# Patient Record
Sex: Female | Born: 1935
Health system: Southern US, Community
[De-identification: ages and names within clinical notes are randomized; demographics above are authoritative.]

## PROBLEM LIST (undated history)

## (undated) DIAGNOSIS — I509 Heart failure, unspecified: Secondary | ICD-10-CM

## (undated) DIAGNOSIS — N189 Chronic kidney disease, unspecified: Secondary | ICD-10-CM

## (undated) DIAGNOSIS — J449 Chronic obstructive pulmonary disease, unspecified: Secondary | ICD-10-CM

## (undated) DIAGNOSIS — D649 Anemia, unspecified: Secondary | ICD-10-CM

## (undated) DIAGNOSIS — E119 Type 2 diabetes mellitus without complications: Secondary | ICD-10-CM

## (undated) DIAGNOSIS — I10 Essential (primary) hypertension: Secondary | ICD-10-CM

## (undated) HISTORY — PX: OTHER SURGICAL HISTORY: SHX169

## (undated) HISTORY — DX: Essential (primary) hypertension: I10

## (undated) HISTORY — PX: CATARACT EXTRACTION, BILATERAL: SHX1313

## (undated) HISTORY — DX: Chronic kidney disease, unspecified: N18.9

## (undated) HISTORY — PX: VALVE REPLACEMENT: SUR13

## (undated) HISTORY — DX: Type 2 diabetes mellitus without complications: E11.9

## (undated) HISTORY — PX: PACEMAKER PLACEMENT: SHX43

---

## 2005-08-15 ENCOUNTER — Ambulatory Visit: Payer: Self-pay | Admitting: Internal Medicine

## 2005-09-04 ENCOUNTER — Ambulatory Visit: Payer: Self-pay | Admitting: Internal Medicine

## 2005-10-05 ENCOUNTER — Ambulatory Visit: Payer: Self-pay | Admitting: Internal Medicine

## 2006-01-08 ENCOUNTER — Ambulatory Visit: Payer: Self-pay | Admitting: Internal Medicine

## 2007-06-29 ENCOUNTER — Ambulatory Visit: Payer: Self-pay | Admitting: Internal Medicine

## 2007-11-02 ENCOUNTER — Ambulatory Visit: Payer: Self-pay | Admitting: Internal Medicine

## 2008-01-28 ENCOUNTER — Inpatient Hospital Stay (HOSPITAL_COMMUNITY): Admission: EM | Admit: 2008-01-28 | Discharge: 2008-01-31 | Payer: Self-pay | Admitting: Emergency Medicine

## 2008-08-23 ENCOUNTER — Ambulatory Visit: Payer: Self-pay | Admitting: Internal Medicine

## 2008-10-11 ENCOUNTER — Ambulatory Visit: Payer: Self-pay | Admitting: Cardiology

## 2008-10-11 ENCOUNTER — Inpatient Hospital Stay: Payer: Self-pay | Admitting: Internal Medicine

## 2008-11-17 ENCOUNTER — Inpatient Hospital Stay: Payer: Self-pay | Admitting: Internal Medicine

## 2008-11-24 ENCOUNTER — Inpatient Hospital Stay: Payer: Self-pay | Admitting: Internal Medicine

## 2009-05-21 ENCOUNTER — Ambulatory Visit: Payer: Self-pay | Admitting: Internal Medicine

## 2010-06-25 ENCOUNTER — Inpatient Hospital Stay: Payer: Self-pay | Admitting: Internal Medicine

## 2010-07-04 ENCOUNTER — Emergency Department: Payer: Self-pay | Admitting: Emergency Medicine

## 2010-07-31 ENCOUNTER — Ambulatory Visit: Payer: Self-pay | Admitting: Internal Medicine

## 2010-11-19 NOTE — H&P (Signed)
NAME:  Kimberly Peterson, Kimberly Peterson NO.:  [id number]   MEDICAL RECORD NO.:  [id number]          PATIENT TYPE:  INP   LOCATION:  1432                         FACILITY:  [facility]   PHYSICIAN:  Della Goo, M.D. DATE OF BIRTH:  12-17-1929   DATE OF ADMISSION:  01/28/2008  DATE OF DISCHARGE:                              HISTORY & PHYSICAL   PRIMARY CARE PHYSICIAN:  Unassigned.   CHIEF COMPLAINT:  Fever, chills.   HISTORY OF PRESENT ILLNESS:  This is a 77 year old female who was  brought to the emergency department emergently after having sudden onset  of shakes, fevers and chills.  She reports feeling cold when the  sensation began.  The patient's family was concerned that she may have  been having a seizure.  The patient denies having any cough, congestion,  dysuria, chest pain, shortness of breath or nausea, vomiting, diarrhea.  The patient was brought to the emergency department and she was found to  have a temperature of 101.3 and a white blood cell count of 17.2 with  89% neutrophils.   PAST MEDICAL HISTORY:  Significant for type 2 diabetes mellitus and  hypertension.   PAST SURGICAL HISTORY:  None.   MEDICATIONS:  Will be further verified.   ALLERGIES:  NO KNOWN DRUG ALLERGIES.   SOCIAL HISTORY:  The patient is married.  She is a nonsmoker,  nondrinker.   FAMILY HISTORY:  Noncontributory.   REVIEW OF SYSTEMS:  Pertinents as mentioned above.   PHYSICAL EXAMINATION FINDINGS:  GENERAL:  This is an obese 76 year old  female in no discomfort or acute distress.  VITAL SIGNS:  Temperature 101.3, blood pressure 176/74, heart rate 99,  respirations 20, O2 saturation 93% on room air.  HEENT:  Normocephalic atraumatic.  Pupils equally round reactive to  light.  Extraocular movements are intact.  Funduscopic benign,  oropharynx is clear.  NECK:  Supple full range of motion.  No thyromegaly, adenopathy or  jugular venous distention.  CARDIOVASCULAR:  Regular rate and  rhythm.  No murmurs, gallops or rubs.  LUNGS: Clear to auscultation bilaterally.  ABDOMEN: Positive bowel sounds, soft, nontender, nondistended.  EXTREMITIES: Without cyanosis, clubbing or edema.  NEUROLOGIC:  Nonfocal.   LABORATORY STUDIES:  White blood cell count 17.2, hemoglobin 11.1,  hematocrit 33.4, platelets 312, neutrophils 89%, lymphocytes 7%.  Sodium  134, potassium 4.2, chloride 100, bicarb 29, BUN 33, creatinine 1.4 and  glucose 167.  Chest x-ray reveals pulmonary vascular congestion with  minimal bronchitic changes.   ASSESSMENT:  A 77 year old female being admitted with:  1. Sepsis.  2. Hypertension.  3. Mild dehydration.  4. Mild anemia.  5. Pyuria.   PLAN:  The patient will be admitted to telemetry area for cardiac  monitoring.  Blood cultures x2 have been sent along with a urine  culture, and the patient will be placed on IV fluids and IV antibiotic  therapy of vancomycin and Zosyn at this time.  Her home medications will  be further verified.  Sliding-scale insulin coverage has been ordered  for elevated blood sugars, and the patient will be placed on DVT  and GI  prophylaxis.      Della Goo, M.D.  Electronically Signed     HJ/MEDQ  D:  01/29/2008  T:  01/29/2008  Job:  [id number]

## 2010-11-19 NOTE — Discharge Summary (Signed)
NAME:  Kimberly Peterson, Kimberly Peterson NO.:  [id number]   MEDICAL RECORD NO.:  [id number]          PATIENT TYPE:  INP   LOCATION:  1432                         FACILITY:  [facility]   PHYSICIAN:  Lonia Blood, M.D.DATE OF BIRTH:  [birth year]   DATE OF ADMISSION:  01/28/2008  DATE OF DISCHARGE:  01/31/2008                               DISCHARGE SUMMARY   PRIMARY CARE PHYSICIAN:  Unassigned/physician in [place].   DISCHARGE DIAGNOSES:  1. Fever of unknown origin.      a.     Chest x-ray and follow-up chest x-ray and clinical exam       negative for pneumonia.      b.     Negative urinalysis.      c.     Negative blood cultures.      d.     Resolved with empiric antibiotic therapy.  2. Diabetes mellitus type 2 - uncontrolled.  3. Hypertension - uncontrolled.  4. Dehydration - corrected with intravenous fluid resuscitation.  5. NO KNOWN DRUG ALLERGIES.   DISCHARGE MEDICATIONS:  1. Clonidine 0.3 mg p.o. t.i.d.  2. Aspirin daily.  3. Amlodipine 10 mg p.o. daily.  4. Reglan 5 mg t.i.d.  5. 70/30 insulin 40 units q.a.m. and 20 units q.p.m.  6. Metformin/glyburide combination 500/2.5  two tablets b.i.d.  7. Lisinopril 20 mg daily.  8. Avelox 400 mg daily.  9. Norvasc 10 mg daily.   FOLLOW UP:  The patient is advised to follow-up with her primary care  physician in [place] on July 30 or February 04, 2008 for routine medical  recheck.  Further titration of antihypertensives is recommended at that  time.  Please note diuretics have been discontinued as the patient was  significantly dehydrated at the time of her presentation.   CONSULTATIONS:  None.   PROCEDURES:  None.   HOSPITAL COURSE:  Kimberly Peterson is a very pleasant 77 year old  female who presented to the hospital on the day of admission with sudden  onset of severe rigors and fevers.  She was found have a temperature of  101.3, a white blood cell count of 17.2 thousand.  Urinalysis was mildly  positive with a  trace leukocyte esterase.  Otherwise white blood cell  count was insignificant.  Urine was sent for culture.  Blood was sent  for culture x2.  Chest x-ray and clinical exam were not significant for  evidence of pneumonia.  The patient was placed on empiric IV Zosyn.  The  patient was placed in acute units for monitoring.  She was also found be  markedly dehydrated.  The patient's diuretics were held.  She was volume  resuscitated using crystalloid IV solution.  The patient tolerated this  without difficulty.  The blood pressure was difficult to control and  antihypertensives had to be titrated.  The patient's diabetes was  difficult to control and her diabetic regimen had to be titrated as  well.  With Zosyn therapy the patient rapidly defervesced.  White blood  cell count rapidly normalized.  At the time of the patient's discharge  her white  count is stable at 10.5.  Her T-max for the last 48 hours was  98.7.  She is up and around and tolerating her regular diet without  difficulty.  At the present time, the exact cause of the patient's fever  is unclear.  Nonetheless, clinically she has completely recovered and  there is no indication for ongoing inpatient hospital stay.  The patient  has been advised that should her fevers recur that she should seek  immediate attention.  Otherwise she is advised to follow up with her  primary care physician in the time frame listed above.      Lonia Blood, M.D.  Electronically Signed     JTM/MEDQ  D:  01/31/2008  T:  01/31/2008  Job:  [id number]

## 2011-04-04 LAB — DIFFERENTIAL
Lymphocytes Relative: 7 — ABNORMAL LOW
Lymphs Abs: 1.1
Monocytes Absolute: 0.8
Monocytes Relative: 4
Neutro Abs: 15.3 — ABNORMAL HIGH

## 2011-04-04 LAB — CBC
HCT: 33.3 — ABNORMAL LOW
HCT: 33.4 — ABNORMAL LOW
Hemoglobin: 11.1 — ABNORMAL LOW
Hemoglobin: 11.1 — ABNORMAL LOW
MCHC: 33.3
MCV: 87.4
Platelets: 321
Platelets: 324
RBC: 3.82 — ABNORMAL LOW
RBC: 3.85 — ABNORMAL LOW
RDW: 13.7

## 2011-04-04 LAB — POCT I-STAT, CHEM 8
BUN: 33 — ABNORMAL HIGH
Calcium, Ion: 1.11 — ABNORMAL LOW
Chloride: 100
Creatinine, Ser: 1.4 — ABNORMAL HIGH
Glucose, Bld: 167 — ABNORMAL HIGH

## 2011-04-04 LAB — URINE MICROSCOPIC-ADD ON

## 2011-04-04 LAB — GLUCOSE, CAPILLARY
Glucose-Capillary: 209 — ABNORMAL HIGH
Glucose-Capillary: 255 — ABNORMAL HIGH
Glucose-Capillary: 261 — ABNORMAL HIGH
Glucose-Capillary: 266 — ABNORMAL HIGH
Glucose-Capillary: 289 — ABNORMAL HIGH

## 2011-04-04 LAB — COMPREHENSIVE METABOLIC PANEL
ALT: 16
Albumin: 2.9 — ABNORMAL LOW
Alkaline Phosphatase: 64
Glucose, Bld: 186 — ABNORMAL HIGH
Potassium: 4
Sodium: 138
Total Protein: 6.8

## 2011-04-04 LAB — HEMOGLOBIN A1C: Hgb A1c MFr Bld: 6.6 — ABNORMAL HIGH

## 2011-04-04 LAB — CULTURE, BLOOD (ROUTINE X 2)
Culture: NO GROWTH
Culture: NO GROWTH

## 2011-04-04 LAB — URINALYSIS, ROUTINE W REFLEX MICROSCOPIC
Bilirubin Urine: NEGATIVE
Hgb urine dipstick: NEGATIVE
Ketones, ur: NEGATIVE
Specific Gravity, Urine: 1.011
Urobilinogen, UA: 0.2
pH: 5.5

## 2011-04-04 LAB — URINE CULTURE

## 2011-04-08 ENCOUNTER — Encounter: Payer: Self-pay | Admitting: Internal Medicine

## 2011-08-13 DIAGNOSIS — M79609 Pain in unspecified limb: Secondary | ICD-10-CM | POA: Diagnosis not present

## 2011-08-13 DIAGNOSIS — B351 Tinea unguium: Secondary | ICD-10-CM | POA: Diagnosis not present

## 2011-08-13 DIAGNOSIS — L84 Corns and callosities: Secondary | ICD-10-CM | POA: Diagnosis not present

## 2011-08-22 DIAGNOSIS — E1159 Type 2 diabetes mellitus with other circulatory complications: Secondary | ICD-10-CM | POA: Diagnosis not present

## 2011-08-22 DIAGNOSIS — E1065 Type 1 diabetes mellitus with hyperglycemia: Secondary | ICD-10-CM | POA: Diagnosis not present

## 2011-08-22 DIAGNOSIS — I10 Essential (primary) hypertension: Secondary | ICD-10-CM | POA: Diagnosis not present

## 2011-08-22 DIAGNOSIS — I509 Heart failure, unspecified: Secondary | ICD-10-CM | POA: Diagnosis not present

## 2011-08-22 DIAGNOSIS — E782 Mixed hyperlipidemia: Secondary | ICD-10-CM | POA: Diagnosis not present

## 2011-08-22 DIAGNOSIS — E049 Nontoxic goiter, unspecified: Secondary | ICD-10-CM | POA: Diagnosis not present

## 2011-09-04 DIAGNOSIS — I10 Essential (primary) hypertension: Secondary | ICD-10-CM | POA: Diagnosis not present

## 2011-10-02 DIAGNOSIS — I059 Rheumatic mitral valve disease, unspecified: Secondary | ICD-10-CM | POA: Diagnosis not present

## 2011-10-02 DIAGNOSIS — E782 Mixed hyperlipidemia: Secondary | ICD-10-CM | POA: Diagnosis not present

## 2011-10-02 DIAGNOSIS — I119 Hypertensive heart disease without heart failure: Secondary | ICD-10-CM | POA: Diagnosis not present

## 2011-10-02 DIAGNOSIS — R55 Syncope and collapse: Secondary | ICD-10-CM | POA: Diagnosis not present

## 2011-10-14 DIAGNOSIS — H6120 Impacted cerumen, unspecified ear: Secondary | ICD-10-CM | POA: Diagnosis not present

## 2011-10-14 DIAGNOSIS — R42 Dizziness and giddiness: Secondary | ICD-10-CM | POA: Diagnosis not present

## 2011-10-16 DIAGNOSIS — R809 Proteinuria, unspecified: Secondary | ICD-10-CM | POA: Diagnosis not present

## 2011-10-16 DIAGNOSIS — I10 Essential (primary) hypertension: Secondary | ICD-10-CM | POA: Diagnosis not present

## 2011-10-16 DIAGNOSIS — E119 Type 2 diabetes mellitus without complications: Secondary | ICD-10-CM | POA: Diagnosis not present

## 2011-10-16 DIAGNOSIS — Z954 Presence of other heart-valve replacement: Secondary | ICD-10-CM | POA: Diagnosis not present

## 2011-10-16 DIAGNOSIS — E785 Hyperlipidemia, unspecified: Secondary | ICD-10-CM | POA: Diagnosis not present

## 2011-11-11 DIAGNOSIS — B351 Tinea unguium: Secondary | ICD-10-CM | POA: Diagnosis not present

## 2011-11-11 DIAGNOSIS — M79609 Pain in unspecified limb: Secondary | ICD-10-CM | POA: Diagnosis not present

## 2011-11-17 ENCOUNTER — Ambulatory Visit: Payer: Self-pay | Admitting: Internal Medicine

## 2011-11-17 DIAGNOSIS — Z1231 Encounter for screening mammogram for malignant neoplasm of breast: Secondary | ICD-10-CM | POA: Diagnosis not present

## 2011-11-20 DIAGNOSIS — I359 Nonrheumatic aortic valve disorder, unspecified: Secondary | ICD-10-CM | POA: Diagnosis not present

## 2011-11-20 DIAGNOSIS — I4891 Unspecified atrial fibrillation: Secondary | ICD-10-CM | POA: Diagnosis not present

## 2011-11-20 DIAGNOSIS — E049 Nontoxic goiter, unspecified: Secondary | ICD-10-CM | POA: Diagnosis not present

## 2011-11-20 DIAGNOSIS — E782 Mixed hyperlipidemia: Secondary | ICD-10-CM | POA: Diagnosis not present

## 2011-11-20 DIAGNOSIS — M109 Gout, unspecified: Secondary | ICD-10-CM | POA: Diagnosis not present

## 2011-12-03 DIAGNOSIS — E1139 Type 2 diabetes mellitus with other diabetic ophthalmic complication: Secondary | ICD-10-CM | POA: Diagnosis not present

## 2011-12-03 DIAGNOSIS — E11359 Type 2 diabetes mellitus with proliferative diabetic retinopathy without macular edema: Secondary | ICD-10-CM | POA: Diagnosis not present

## 2012-01-06 DIAGNOSIS — I442 Atrioventricular block, complete: Secondary | ICD-10-CM | POA: Diagnosis not present

## 2012-02-11 DIAGNOSIS — M79609 Pain in unspecified limb: Secondary | ICD-10-CM | POA: Diagnosis not present

## 2012-02-11 DIAGNOSIS — B351 Tinea unguium: Secondary | ICD-10-CM | POA: Diagnosis not present

## 2012-03-15 DIAGNOSIS — N2581 Secondary hyperparathyroidism of renal origin: Secondary | ICD-10-CM | POA: Diagnosis not present

## 2012-03-15 DIAGNOSIS — I10 Essential (primary) hypertension: Secondary | ICD-10-CM | POA: Diagnosis not present

## 2012-04-01 DIAGNOSIS — E1159 Type 2 diabetes mellitus with other circulatory complications: Secondary | ICD-10-CM | POA: Diagnosis not present

## 2012-04-01 DIAGNOSIS — E782 Mixed hyperlipidemia: Secondary | ICD-10-CM | POA: Diagnosis not present

## 2012-04-01 DIAGNOSIS — I4891 Unspecified atrial fibrillation: Secondary | ICD-10-CM | POA: Diagnosis not present

## 2012-04-01 DIAGNOSIS — K219 Gastro-esophageal reflux disease without esophagitis: Secondary | ICD-10-CM | POA: Diagnosis not present

## 2012-04-16 DIAGNOSIS — IMO0001 Reserved for inherently not codable concepts without codable children: Secondary | ICD-10-CM | POA: Diagnosis not present

## 2012-04-16 DIAGNOSIS — E119 Type 2 diabetes mellitus without complications: Secondary | ICD-10-CM | POA: Diagnosis not present

## 2012-04-16 DIAGNOSIS — H6690 Otitis media, unspecified, unspecified ear: Secondary | ICD-10-CM | POA: Diagnosis not present

## 2012-04-16 DIAGNOSIS — J309 Allergic rhinitis, unspecified: Secondary | ICD-10-CM | POA: Diagnosis not present

## 2012-04-27 DIAGNOSIS — Z954 Presence of other heart-valve replacement: Secondary | ICD-10-CM | POA: Diagnosis not present

## 2012-04-27 DIAGNOSIS — I10 Essential (primary) hypertension: Secondary | ICD-10-CM | POA: Diagnosis not present

## 2012-04-27 DIAGNOSIS — E119 Type 2 diabetes mellitus without complications: Secondary | ICD-10-CM | POA: Diagnosis not present

## 2012-04-27 DIAGNOSIS — E785 Hyperlipidemia, unspecified: Secondary | ICD-10-CM | POA: Diagnosis not present

## 2012-04-28 DIAGNOSIS — H04129 Dry eye syndrome of unspecified lacrimal gland: Secondary | ICD-10-CM | POA: Diagnosis not present

## 2012-05-11 DIAGNOSIS — M79609 Pain in unspecified limb: Secondary | ICD-10-CM | POA: Diagnosis not present

## 2012-05-11 DIAGNOSIS — B351 Tinea unguium: Secondary | ICD-10-CM | POA: Diagnosis not present

## 2012-06-10 DIAGNOSIS — E1065 Type 1 diabetes mellitus with hyperglycemia: Secondary | ICD-10-CM | POA: Diagnosis not present

## 2012-06-10 DIAGNOSIS — Z79899 Other long term (current) drug therapy: Secondary | ICD-10-CM | POA: Diagnosis not present

## 2012-06-10 DIAGNOSIS — I10 Essential (primary) hypertension: Secondary | ICD-10-CM | POA: Diagnosis not present

## 2012-06-10 DIAGNOSIS — E782 Mixed hyperlipidemia: Secondary | ICD-10-CM | POA: Diagnosis not present

## 2012-07-28 DIAGNOSIS — I442 Atrioventricular block, complete: Secondary | ICD-10-CM | POA: Diagnosis not present

## 2012-08-03 DIAGNOSIS — E1159 Type 2 diabetes mellitus with other circulatory complications: Secondary | ICD-10-CM | POA: Diagnosis not present

## 2012-08-03 DIAGNOSIS — I4891 Unspecified atrial fibrillation: Secondary | ICD-10-CM | POA: Diagnosis not present

## 2012-08-03 DIAGNOSIS — I10 Essential (primary) hypertension: Secondary | ICD-10-CM | POA: Diagnosis not present

## 2012-08-03 DIAGNOSIS — E782 Mixed hyperlipidemia: Secondary | ICD-10-CM | POA: Diagnosis not present

## 2012-08-03 DIAGNOSIS — N189 Chronic kidney disease, unspecified: Secondary | ICD-10-CM | POA: Diagnosis not present

## 2012-08-03 DIAGNOSIS — E669 Obesity, unspecified: Secondary | ICD-10-CM | POA: Diagnosis not present

## 2012-08-11 DIAGNOSIS — B351 Tinea unguium: Secondary | ICD-10-CM | POA: Diagnosis not present

## 2012-08-11 DIAGNOSIS — M79609 Pain in unspecified limb: Secondary | ICD-10-CM | POA: Diagnosis not present

## 2012-09-30 DIAGNOSIS — N2581 Secondary hyperparathyroidism of renal origin: Secondary | ICD-10-CM | POA: Diagnosis not present

## 2012-09-30 DIAGNOSIS — I10 Essential (primary) hypertension: Secondary | ICD-10-CM | POA: Diagnosis not present

## 2012-09-30 DIAGNOSIS — R609 Edema, unspecified: Secondary | ICD-10-CM | POA: Diagnosis not present

## 2012-10-20 DIAGNOSIS — I359 Nonrheumatic aortic valve disorder, unspecified: Secondary | ICD-10-CM | POA: Diagnosis not present

## 2012-10-20 DIAGNOSIS — I495 Sick sinus syndrome: Secondary | ICD-10-CM | POA: Diagnosis not present

## 2012-10-20 DIAGNOSIS — E782 Mixed hyperlipidemia: Secondary | ICD-10-CM | POA: Diagnosis not present

## 2012-10-20 DIAGNOSIS — R0602 Shortness of breath: Secondary | ICD-10-CM | POA: Diagnosis not present

## 2012-11-08 DIAGNOSIS — M79609 Pain in unspecified limb: Secondary | ICD-10-CM | POA: Diagnosis not present

## 2012-11-08 DIAGNOSIS — B351 Tinea unguium: Secondary | ICD-10-CM | POA: Diagnosis not present

## 2012-11-10 DIAGNOSIS — I359 Nonrheumatic aortic valve disorder, unspecified: Secondary | ICD-10-CM | POA: Diagnosis not present

## 2012-12-01 DIAGNOSIS — Z006 Encounter for examination for normal comparison and control in clinical research program: Secondary | ICD-10-CM | POA: Diagnosis not present

## 2012-12-01 DIAGNOSIS — E1065 Type 1 diabetes mellitus with hyperglycemia: Secondary | ICD-10-CM | POA: Diagnosis not present

## 2012-12-01 DIAGNOSIS — N189 Chronic kidney disease, unspecified: Secondary | ICD-10-CM | POA: Diagnosis not present

## 2012-12-01 DIAGNOSIS — E1069 Type 1 diabetes mellitus with other specified complication: Secondary | ICD-10-CM | POA: Diagnosis not present

## 2012-12-01 DIAGNOSIS — I498 Other specified cardiac arrhythmias: Secondary | ICD-10-CM | POA: Diagnosis not present

## 2012-12-01 DIAGNOSIS — E669 Obesity, unspecified: Secondary | ICD-10-CM | POA: Diagnosis not present

## 2012-12-01 DIAGNOSIS — I10 Essential (primary) hypertension: Secondary | ICD-10-CM | POA: Diagnosis not present

## 2012-12-01 DIAGNOSIS — I739 Peripheral vascular disease, unspecified: Secondary | ICD-10-CM | POA: Diagnosis not present

## 2012-12-08 DIAGNOSIS — I359 Nonrheumatic aortic valve disorder, unspecified: Secondary | ICD-10-CM | POA: Diagnosis not present

## 2012-12-08 DIAGNOSIS — I2510 Atherosclerotic heart disease of native coronary artery without angina pectoris: Secondary | ICD-10-CM | POA: Diagnosis not present

## 2012-12-08 DIAGNOSIS — I1310 Hypertensive heart and chronic kidney disease without heart failure, with stage 1 through stage 4 chronic kidney disease, or unspecified chronic kidney disease: Secondary | ICD-10-CM | POA: Diagnosis not present

## 2012-12-08 DIAGNOSIS — N182 Chronic kidney disease, stage 2 (mild): Secondary | ICD-10-CM | POA: Diagnosis not present

## 2012-12-21 DIAGNOSIS — E119 Type 2 diabetes mellitus without complications: Secondary | ICD-10-CM | POA: Diagnosis not present

## 2012-12-21 DIAGNOSIS — J069 Acute upper respiratory infection, unspecified: Secondary | ICD-10-CM | POA: Diagnosis not present

## 2012-12-21 DIAGNOSIS — K219 Gastro-esophageal reflux disease without esophagitis: Secondary | ICD-10-CM | POA: Diagnosis not present

## 2012-12-21 DIAGNOSIS — R5383 Other fatigue: Secondary | ICD-10-CM | POA: Diagnosis not present

## 2012-12-21 DIAGNOSIS — R05 Cough: Secondary | ICD-10-CM | POA: Diagnosis not present

## 2012-12-21 DIAGNOSIS — I10 Essential (primary) hypertension: Secondary | ICD-10-CM | POA: Diagnosis not present

## 2012-12-21 DIAGNOSIS — R5381 Other malaise: Secondary | ICD-10-CM | POA: Diagnosis not present

## 2012-12-31 DIAGNOSIS — E119 Type 2 diabetes mellitus without complications: Secondary | ICD-10-CM | POA: Diagnosis not present

## 2012-12-31 DIAGNOSIS — J069 Acute upper respiratory infection, unspecified: Secondary | ICD-10-CM | POA: Diagnosis not present

## 2012-12-31 DIAGNOSIS — R05 Cough: Secondary | ICD-10-CM | POA: Diagnosis not present

## 2012-12-31 DIAGNOSIS — R062 Wheezing: Secondary | ICD-10-CM | POA: Diagnosis not present

## 2012-12-31 DIAGNOSIS — I10 Essential (primary) hypertension: Secondary | ICD-10-CM | POA: Diagnosis not present

## 2012-12-31 DIAGNOSIS — R5381 Other malaise: Secondary | ICD-10-CM | POA: Diagnosis not present

## 2013-01-25 DIAGNOSIS — I442 Atrioventricular block, complete: Secondary | ICD-10-CM | POA: Diagnosis not present

## 2013-02-07 DIAGNOSIS — M79609 Pain in unspecified limb: Secondary | ICD-10-CM | POA: Diagnosis not present

## 2013-02-07 DIAGNOSIS — B351 Tinea unguium: Secondary | ICD-10-CM | POA: Diagnosis not present

## 2013-02-07 DIAGNOSIS — E1049 Type 1 diabetes mellitus with other diabetic neurological complication: Secondary | ICD-10-CM | POA: Diagnosis not present

## 2013-02-28 DIAGNOSIS — N2581 Secondary hyperparathyroidism of renal origin: Secondary | ICD-10-CM | POA: Diagnosis not present

## 2013-02-28 DIAGNOSIS — I10 Essential (primary) hypertension: Secondary | ICD-10-CM | POA: Diagnosis not present

## 2013-02-28 DIAGNOSIS — N183 Chronic kidney disease, stage 3 unspecified: Secondary | ICD-10-CM | POA: Diagnosis not present

## 2013-02-28 DIAGNOSIS — R609 Edema, unspecified: Secondary | ICD-10-CM | POA: Diagnosis not present

## 2013-03-09 DIAGNOSIS — E1159 Type 2 diabetes mellitus with other circulatory complications: Secondary | ICD-10-CM | POA: Diagnosis not present

## 2013-03-09 DIAGNOSIS — I739 Peripheral vascular disease, unspecified: Secondary | ICD-10-CM | POA: Diagnosis not present

## 2013-03-09 DIAGNOSIS — E162 Hypoglycemia, unspecified: Secondary | ICD-10-CM | POA: Diagnosis not present

## 2013-03-09 DIAGNOSIS — I43 Cardiomyopathy in diseases classified elsewhere: Secondary | ICD-10-CM | POA: Diagnosis not present

## 2013-03-09 DIAGNOSIS — R0602 Shortness of breath: Secondary | ICD-10-CM | POA: Diagnosis not present

## 2013-03-09 DIAGNOSIS — I110 Hypertensive heart disease with heart failure: Secondary | ICD-10-CM | POA: Diagnosis not present

## 2013-03-09 DIAGNOSIS — R609 Edema, unspecified: Secondary | ICD-10-CM | POA: Diagnosis not present

## 2013-03-24 DIAGNOSIS — R0602 Shortness of breath: Secondary | ICD-10-CM | POA: Diagnosis not present

## 2013-03-26 ENCOUNTER — Emergency Department: Payer: Self-pay | Admitting: Emergency Medicine

## 2013-03-26 DIAGNOSIS — Z79899 Other long term (current) drug therapy: Secondary | ICD-10-CM | POA: Diagnosis not present

## 2013-03-26 DIAGNOSIS — I10 Essential (primary) hypertension: Secondary | ICD-10-CM | POA: Diagnosis not present

## 2013-03-26 DIAGNOSIS — I872 Venous insufficiency (chronic) (peripheral): Secondary | ICD-10-CM | POA: Diagnosis not present

## 2013-03-26 DIAGNOSIS — Z954 Presence of other heart-valve replacement: Secondary | ICD-10-CM | POA: Diagnosis not present

## 2013-03-26 DIAGNOSIS — I509 Heart failure, unspecified: Secondary | ICD-10-CM | POA: Diagnosis not present

## 2013-03-26 DIAGNOSIS — E119 Type 2 diabetes mellitus without complications: Secondary | ICD-10-CM | POA: Diagnosis not present

## 2013-03-28 DIAGNOSIS — I509 Heart failure, unspecified: Secondary | ICD-10-CM | POA: Diagnosis not present

## 2013-03-28 DIAGNOSIS — E1059 Type 1 diabetes mellitus with other circulatory complications: Secondary | ICD-10-CM | POA: Diagnosis not present

## 2013-03-28 DIAGNOSIS — I110 Hypertensive heart disease with heart failure: Secondary | ICD-10-CM | POA: Diagnosis not present

## 2013-03-28 DIAGNOSIS — E1169 Type 2 diabetes mellitus with other specified complication: Secondary | ICD-10-CM | POA: Diagnosis not present

## 2013-03-31 DIAGNOSIS — I509 Heart failure, unspecified: Secondary | ICD-10-CM | POA: Diagnosis not present

## 2013-03-31 DIAGNOSIS — G4720 Circadian rhythm sleep disorder, unspecified type: Secondary | ICD-10-CM | POA: Diagnosis not present

## 2013-03-31 DIAGNOSIS — I10 Essential (primary) hypertension: Secondary | ICD-10-CM | POA: Diagnosis not present

## 2013-03-31 DIAGNOSIS — I110 Hypertensive heart disease with heart failure: Secondary | ICD-10-CM | POA: Diagnosis not present

## 2013-03-31 DIAGNOSIS — Z79899 Other long term (current) drug therapy: Secondary | ICD-10-CM | POA: Diagnosis not present

## 2013-03-31 DIAGNOSIS — E1169 Type 2 diabetes mellitus with other specified complication: Secondary | ICD-10-CM | POA: Diagnosis not present

## 2013-04-14 ENCOUNTER — Ambulatory Visit: Payer: Self-pay | Admitting: Physician Assistant

## 2013-04-14 DIAGNOSIS — M25569 Pain in unspecified knee: Secondary | ICD-10-CM | POA: Diagnosis not present

## 2013-04-14 DIAGNOSIS — Z9181 History of falling: Secondary | ICD-10-CM | POA: Diagnosis not present

## 2013-04-14 DIAGNOSIS — IMO0002 Reserved for concepts with insufficient information to code with codable children: Secondary | ICD-10-CM | POA: Diagnosis not present

## 2013-04-15 DIAGNOSIS — E1169 Type 2 diabetes mellitus with other specified complication: Secondary | ICD-10-CM | POA: Diagnosis not present

## 2013-04-15 DIAGNOSIS — R609 Edema, unspecified: Secondary | ICD-10-CM | POA: Diagnosis not present

## 2013-04-15 DIAGNOSIS — M1710 Unilateral primary osteoarthritis, unspecified knee: Secondary | ICD-10-CM | POA: Diagnosis not present

## 2013-04-15 DIAGNOSIS — M25569 Pain in unspecified knee: Secondary | ICD-10-CM | POA: Diagnosis not present

## 2013-04-15 DIAGNOSIS — I110 Hypertensive heart disease with heart failure: Secondary | ICD-10-CM | POA: Diagnosis not present

## 2013-04-15 DIAGNOSIS — W19XXXA Unspecified fall, initial encounter: Secondary | ICD-10-CM | POA: Diagnosis not present

## 2013-04-20 DIAGNOSIS — M25569 Pain in unspecified knee: Secondary | ICD-10-CM | POA: Diagnosis not present

## 2013-04-20 DIAGNOSIS — I110 Hypertensive heart disease with heart failure: Secondary | ICD-10-CM | POA: Diagnosis not present

## 2013-04-20 DIAGNOSIS — M1710 Unilateral primary osteoarthritis, unspecified knee: Secondary | ICD-10-CM | POA: Diagnosis not present

## 2013-04-20 DIAGNOSIS — R609 Edema, unspecified: Secondary | ICD-10-CM | POA: Diagnosis not present

## 2013-04-21 DIAGNOSIS — I10 Essential (primary) hypertension: Secondary | ICD-10-CM | POA: Diagnosis not present

## 2013-04-21 DIAGNOSIS — IMO0001 Reserved for inherently not codable concepts without codable children: Secondary | ICD-10-CM | POA: Diagnosis not present

## 2013-04-21 DIAGNOSIS — Z794 Long term (current) use of insulin: Secondary | ICD-10-CM | POA: Diagnosis not present

## 2013-04-21 DIAGNOSIS — R262 Difficulty in walking, not elsewhere classified: Secondary | ICD-10-CM | POA: Diagnosis not present

## 2013-04-21 DIAGNOSIS — R609 Edema, unspecified: Secondary | ICD-10-CM | POA: Diagnosis not present

## 2013-04-21 DIAGNOSIS — M25569 Pain in unspecified knee: Secondary | ICD-10-CM | POA: Diagnosis not present

## 2013-04-21 DIAGNOSIS — Z9181 History of falling: Secondary | ICD-10-CM | POA: Diagnosis not present

## 2013-04-21 DIAGNOSIS — E119 Type 2 diabetes mellitus without complications: Secondary | ICD-10-CM | POA: Diagnosis not present

## 2013-04-26 DIAGNOSIS — E119 Type 2 diabetes mellitus without complications: Secondary | ICD-10-CM | POA: Diagnosis not present

## 2013-04-26 DIAGNOSIS — R262 Difficulty in walking, not elsewhere classified: Secondary | ICD-10-CM | POA: Diagnosis not present

## 2013-04-26 DIAGNOSIS — M25569 Pain in unspecified knee: Secondary | ICD-10-CM | POA: Diagnosis not present

## 2013-04-26 DIAGNOSIS — R609 Edema, unspecified: Secondary | ICD-10-CM | POA: Diagnosis not present

## 2013-04-26 DIAGNOSIS — Z9181 History of falling: Secondary | ICD-10-CM | POA: Diagnosis not present

## 2013-04-26 DIAGNOSIS — IMO0001 Reserved for inherently not codable concepts without codable children: Secondary | ICD-10-CM | POA: Diagnosis not present

## 2013-04-27 DIAGNOSIS — E119 Type 2 diabetes mellitus without complications: Secondary | ICD-10-CM | POA: Diagnosis not present

## 2013-04-27 DIAGNOSIS — M25569 Pain in unspecified knee: Secondary | ICD-10-CM | POA: Diagnosis not present

## 2013-04-27 DIAGNOSIS — R609 Edema, unspecified: Secondary | ICD-10-CM | POA: Diagnosis not present

## 2013-04-27 DIAGNOSIS — Z9181 History of falling: Secondary | ICD-10-CM | POA: Diagnosis not present

## 2013-04-27 DIAGNOSIS — R262 Difficulty in walking, not elsewhere classified: Secondary | ICD-10-CM | POA: Diagnosis not present

## 2013-04-27 DIAGNOSIS — IMO0001 Reserved for inherently not codable concepts without codable children: Secondary | ICD-10-CM | POA: Diagnosis not present

## 2013-04-29 DIAGNOSIS — Z9181 History of falling: Secondary | ICD-10-CM | POA: Diagnosis not present

## 2013-04-29 DIAGNOSIS — R262 Difficulty in walking, not elsewhere classified: Secondary | ICD-10-CM | POA: Diagnosis not present

## 2013-04-29 DIAGNOSIS — M25569 Pain in unspecified knee: Secondary | ICD-10-CM | POA: Diagnosis not present

## 2013-04-29 DIAGNOSIS — IMO0001 Reserved for inherently not codable concepts without codable children: Secondary | ICD-10-CM | POA: Diagnosis not present

## 2013-04-29 DIAGNOSIS — R609 Edema, unspecified: Secondary | ICD-10-CM | POA: Diagnosis not present

## 2013-04-29 DIAGNOSIS — E119 Type 2 diabetes mellitus without complications: Secondary | ICD-10-CM | POA: Diagnosis not present

## 2013-05-02 DIAGNOSIS — E119 Type 2 diabetes mellitus without complications: Secondary | ICD-10-CM | POA: Diagnosis not present

## 2013-05-02 DIAGNOSIS — Z9181 History of falling: Secondary | ICD-10-CM | POA: Diagnosis not present

## 2013-05-02 DIAGNOSIS — R262 Difficulty in walking, not elsewhere classified: Secondary | ICD-10-CM | POA: Diagnosis not present

## 2013-05-02 DIAGNOSIS — IMO0001 Reserved for inherently not codable concepts without codable children: Secondary | ICD-10-CM | POA: Diagnosis not present

## 2013-05-02 DIAGNOSIS — M25569 Pain in unspecified knee: Secondary | ICD-10-CM | POA: Diagnosis not present

## 2013-05-02 DIAGNOSIS — R609 Edema, unspecified: Secondary | ICD-10-CM | POA: Diagnosis not present

## 2013-05-04 DIAGNOSIS — IMO0001 Reserved for inherently not codable concepts without codable children: Secondary | ICD-10-CM | POA: Diagnosis not present

## 2013-05-04 DIAGNOSIS — M25569 Pain in unspecified knee: Secondary | ICD-10-CM | POA: Diagnosis not present

## 2013-05-04 DIAGNOSIS — R262 Difficulty in walking, not elsewhere classified: Secondary | ICD-10-CM | POA: Diagnosis not present

## 2013-05-04 DIAGNOSIS — E119 Type 2 diabetes mellitus without complications: Secondary | ICD-10-CM | POA: Diagnosis not present

## 2013-05-04 DIAGNOSIS — R609 Edema, unspecified: Secondary | ICD-10-CM | POA: Diagnosis not present

## 2013-05-04 DIAGNOSIS — Z9181 History of falling: Secondary | ICD-10-CM | POA: Diagnosis not present

## 2013-05-06 DIAGNOSIS — Z794 Long term (current) use of insulin: Secondary | ICD-10-CM | POA: Diagnosis not present

## 2013-05-06 DIAGNOSIS — E119 Type 2 diabetes mellitus without complications: Secondary | ICD-10-CM | POA: Diagnosis not present

## 2013-05-06 DIAGNOSIS — R609 Edema, unspecified: Secondary | ICD-10-CM | POA: Diagnosis not present

## 2013-05-06 DIAGNOSIS — I10 Essential (primary) hypertension: Secondary | ICD-10-CM | POA: Diagnosis not present

## 2013-05-06 DIAGNOSIS — R262 Difficulty in walking, not elsewhere classified: Secondary | ICD-10-CM | POA: Diagnosis not present

## 2013-05-06 DIAGNOSIS — Z9181 History of falling: Secondary | ICD-10-CM | POA: Diagnosis not present

## 2013-05-06 DIAGNOSIS — M25569 Pain in unspecified knee: Secondary | ICD-10-CM | POA: Diagnosis not present

## 2013-05-06 DIAGNOSIS — IMO0001 Reserved for inherently not codable concepts without codable children: Secondary | ICD-10-CM | POA: Diagnosis not present

## 2013-05-09 ENCOUNTER — Encounter: Payer: Self-pay | Admitting: Podiatry

## 2013-05-09 ENCOUNTER — Ambulatory Visit (INDEPENDENT_AMBULATORY_CARE_PROVIDER_SITE_OTHER): Payer: Medicare Other | Admitting: Podiatry

## 2013-05-09 VITALS — BP 98/66 | HR 95 | Resp 20 | Ht 62.0 in | Wt 223.0 lb

## 2013-05-09 DIAGNOSIS — B351 Tinea unguium: Secondary | ICD-10-CM

## 2013-05-09 DIAGNOSIS — R262 Difficulty in walking, not elsewhere classified: Secondary | ICD-10-CM | POA: Diagnosis not present

## 2013-05-09 DIAGNOSIS — M79609 Pain in unspecified limb: Secondary | ICD-10-CM | POA: Diagnosis not present

## 2013-05-09 DIAGNOSIS — E119 Type 2 diabetes mellitus without complications: Secondary | ICD-10-CM | POA: Diagnosis not present

## 2013-05-09 DIAGNOSIS — R609 Edema, unspecified: Secondary | ICD-10-CM | POA: Diagnosis not present

## 2013-05-09 DIAGNOSIS — M25569 Pain in unspecified knee: Secondary | ICD-10-CM | POA: Diagnosis not present

## 2013-05-09 DIAGNOSIS — Z9181 History of falling: Secondary | ICD-10-CM | POA: Diagnosis not present

## 2013-05-09 DIAGNOSIS — IMO0001 Reserved for inherently not codable concepts without codable children: Secondary | ICD-10-CM | POA: Diagnosis not present

## 2013-05-09 NOTE — Progress Notes (Signed)
Kimberly Peterson presents today with a chief complaint of painful toenails one through 5 bilateral. Denies any trauma other followed her left knee.  Objective: Pulses remain palpable bilateral lower extremity. Hammertoes 2 through 5 are noted flexible bilateral. Nails are thick yellow dystrophic clinically mycotic. They're also painful palpation as well as debridement.  Assessment: Pain in limb secondary to onychomycosis.  Plan: Debridement of nails 1 through 5 bilateral covered service secondary to pain. Followup with her in 3 months.

## 2013-05-09 NOTE — Progress Notes (Signed)
  Subjective:    Patient ID: Kimberly Peterson, female    DOB: 15-Nov-1929, 79 y.o.   MRN: [id number]  HPI    Review of Systems  Constitutional: Negative.   HENT: Negative.   Respiratory: Positive for shortness of breath.   Cardiovascular: Negative.   Gastrointestinal: Negative.   Endocrine: Negative.   Genitourinary: Negative.   Allergic/Immunologic: Negative.   Neurological: Negative.   Hematological: Negative.   Psychiatric/Behavioral: Negative.        Objective:   Physical Exam        Assessment & Plan:

## 2013-05-12 DIAGNOSIS — Z9181 History of falling: Secondary | ICD-10-CM | POA: Diagnosis not present

## 2013-05-12 DIAGNOSIS — R609 Edema, unspecified: Secondary | ICD-10-CM | POA: Diagnosis not present

## 2013-05-12 DIAGNOSIS — R262 Difficulty in walking, not elsewhere classified: Secondary | ICD-10-CM | POA: Diagnosis not present

## 2013-05-12 DIAGNOSIS — IMO0001 Reserved for inherently not codable concepts without codable children: Secondary | ICD-10-CM | POA: Diagnosis not present

## 2013-05-12 DIAGNOSIS — E119 Type 2 diabetes mellitus without complications: Secondary | ICD-10-CM | POA: Diagnosis not present

## 2013-05-12 DIAGNOSIS — M25569 Pain in unspecified knee: Secondary | ICD-10-CM | POA: Diagnosis not present

## 2013-05-13 DIAGNOSIS — E119 Type 2 diabetes mellitus without complications: Secondary | ICD-10-CM | POA: Diagnosis not present

## 2013-05-13 DIAGNOSIS — M25569 Pain in unspecified knee: Secondary | ICD-10-CM | POA: Diagnosis not present

## 2013-05-13 DIAGNOSIS — R262 Difficulty in walking, not elsewhere classified: Secondary | ICD-10-CM | POA: Diagnosis not present

## 2013-05-13 DIAGNOSIS — Z9181 History of falling: Secondary | ICD-10-CM | POA: Diagnosis not present

## 2013-05-13 DIAGNOSIS — IMO0001 Reserved for inherently not codable concepts without codable children: Secondary | ICD-10-CM | POA: Diagnosis not present

## 2013-05-13 DIAGNOSIS — R609 Edema, unspecified: Secondary | ICD-10-CM | POA: Diagnosis not present

## 2013-05-16 DIAGNOSIS — M25569 Pain in unspecified knee: Secondary | ICD-10-CM | POA: Diagnosis not present

## 2013-05-16 DIAGNOSIS — R609 Edema, unspecified: Secondary | ICD-10-CM | POA: Diagnosis not present

## 2013-05-16 DIAGNOSIS — R262 Difficulty in walking, not elsewhere classified: Secondary | ICD-10-CM | POA: Diagnosis not present

## 2013-05-16 DIAGNOSIS — Z9181 History of falling: Secondary | ICD-10-CM | POA: Diagnosis not present

## 2013-05-16 DIAGNOSIS — E119 Type 2 diabetes mellitus without complications: Secondary | ICD-10-CM | POA: Diagnosis not present

## 2013-05-16 DIAGNOSIS — IMO0001 Reserved for inherently not codable concepts without codable children: Secondary | ICD-10-CM | POA: Diagnosis not present

## 2013-05-18 DIAGNOSIS — IMO0001 Reserved for inherently not codable concepts without codable children: Secondary | ICD-10-CM | POA: Diagnosis not present

## 2013-05-18 DIAGNOSIS — M25569 Pain in unspecified knee: Secondary | ICD-10-CM | POA: Diagnosis not present

## 2013-05-18 DIAGNOSIS — E119 Type 2 diabetes mellitus without complications: Secondary | ICD-10-CM | POA: Diagnosis not present

## 2013-05-18 DIAGNOSIS — Z9181 History of falling: Secondary | ICD-10-CM | POA: Diagnosis not present

## 2013-05-18 DIAGNOSIS — R262 Difficulty in walking, not elsewhere classified: Secondary | ICD-10-CM | POA: Diagnosis not present

## 2013-05-18 DIAGNOSIS — R609 Edema, unspecified: Secondary | ICD-10-CM | POA: Diagnosis not present

## 2013-05-20 DIAGNOSIS — R262 Difficulty in walking, not elsewhere classified: Secondary | ICD-10-CM | POA: Diagnosis not present

## 2013-05-20 DIAGNOSIS — IMO0001 Reserved for inherently not codable concepts without codable children: Secondary | ICD-10-CM | POA: Diagnosis not present

## 2013-05-20 DIAGNOSIS — Z9181 History of falling: Secondary | ICD-10-CM | POA: Diagnosis not present

## 2013-05-20 DIAGNOSIS — M25569 Pain in unspecified knee: Secondary | ICD-10-CM | POA: Diagnosis not present

## 2013-05-20 DIAGNOSIS — R609 Edema, unspecified: Secondary | ICD-10-CM | POA: Diagnosis not present

## 2013-05-20 DIAGNOSIS — E119 Type 2 diabetes mellitus without complications: Secondary | ICD-10-CM | POA: Diagnosis not present

## 2013-06-16 ENCOUNTER — Ambulatory Visit: Payer: Self-pay | Admitting: Internal Medicine

## 2013-06-16 DIAGNOSIS — Z1231 Encounter for screening mammogram for malignant neoplasm of breast: Secondary | ICD-10-CM | POA: Diagnosis not present

## 2013-07-19 DIAGNOSIS — I442 Atrioventricular block, complete: Secondary | ICD-10-CM | POA: Diagnosis not present

## 2013-08-08 ENCOUNTER — Ambulatory Visit: Payer: Medicare Other | Admitting: Podiatry

## 2013-08-17 ENCOUNTER — Inpatient Hospital Stay: Payer: Self-pay | Admitting: Internal Medicine

## 2013-08-17 DIAGNOSIS — G4730 Sleep apnea, unspecified: Secondary | ICD-10-CM | POA: Diagnosis present

## 2013-08-17 DIAGNOSIS — I5033 Acute on chronic diastolic (congestive) heart failure: Secondary | ICD-10-CM | POA: Diagnosis present

## 2013-08-17 DIAGNOSIS — J9600 Acute respiratory failure, unspecified whether with hypoxia or hypercapnia: Secondary | ICD-10-CM | POA: Diagnosis not present

## 2013-08-17 DIAGNOSIS — I2699 Other pulmonary embolism without acute cor pulmonale: Secondary | ICD-10-CM | POA: Diagnosis not present

## 2013-08-17 DIAGNOSIS — R0989 Other specified symptoms and signs involving the circulatory and respiratory systems: Secondary | ICD-10-CM | POA: Diagnosis not present

## 2013-08-17 DIAGNOSIS — J984 Other disorders of lung: Secondary | ICD-10-CM | POA: Diagnosis not present

## 2013-08-17 DIAGNOSIS — R079 Chest pain, unspecified: Secondary | ICD-10-CM | POA: Diagnosis not present

## 2013-08-17 DIAGNOSIS — Z7901 Long term (current) use of anticoagulants: Secondary | ICD-10-CM | POA: Diagnosis not present

## 2013-08-17 DIAGNOSIS — I2782 Chronic pulmonary embolism: Secondary | ICD-10-CM | POA: Diagnosis not present

## 2013-08-17 DIAGNOSIS — R0602 Shortness of breath: Secondary | ICD-10-CM | POA: Diagnosis not present

## 2013-08-17 DIAGNOSIS — I129 Hypertensive chronic kidney disease with stage 1 through stage 4 chronic kidney disease, or unspecified chronic kidney disease: Secondary | ICD-10-CM | POA: Diagnosis not present

## 2013-08-17 DIAGNOSIS — Z6841 Body Mass Index (BMI) 40.0 and over, adult: Secondary | ICD-10-CM | POA: Diagnosis not present

## 2013-08-17 DIAGNOSIS — J90 Pleural effusion, not elsewhere classified: Secondary | ICD-10-CM | POA: Diagnosis not present

## 2013-08-17 DIAGNOSIS — I2510 Atherosclerotic heart disease of native coronary artery without angina pectoris: Secondary | ICD-10-CM | POA: Diagnosis present

## 2013-08-17 DIAGNOSIS — I509 Heart failure, unspecified: Secondary | ICD-10-CM | POA: Diagnosis not present

## 2013-08-17 DIAGNOSIS — J9620 Acute and chronic respiratory failure, unspecified whether with hypoxia or hypercapnia: Secondary | ICD-10-CM | POA: Diagnosis not present

## 2013-08-17 DIAGNOSIS — J811 Chronic pulmonary edema: Secondary | ICD-10-CM | POA: Diagnosis not present

## 2013-08-17 DIAGNOSIS — E1169 Type 2 diabetes mellitus with other specified complication: Secondary | ICD-10-CM | POA: Diagnosis present

## 2013-08-17 DIAGNOSIS — Z794 Long term (current) use of insulin: Secondary | ICD-10-CM | POA: Diagnosis not present

## 2013-08-17 DIAGNOSIS — E669 Obesity, unspecified: Secondary | ICD-10-CM | POA: Diagnosis not present

## 2013-08-17 DIAGNOSIS — K219 Gastro-esophageal reflux disease without esophagitis: Secondary | ICD-10-CM | POA: Diagnosis present

## 2013-08-17 DIAGNOSIS — Z950 Presence of cardiac pacemaker: Secondary | ICD-10-CM | POA: Diagnosis not present

## 2013-08-17 DIAGNOSIS — I10 Essential (primary) hypertension: Secondary | ICD-10-CM | POA: Diagnosis not present

## 2013-08-17 DIAGNOSIS — I059 Rheumatic mitral valve disease, unspecified: Secondary | ICD-10-CM | POA: Diagnosis not present

## 2013-08-17 DIAGNOSIS — R609 Edema, unspecified: Secondary | ICD-10-CM | POA: Diagnosis not present

## 2013-08-17 DIAGNOSIS — R319 Hematuria, unspecified: Secondary | ICD-10-CM | POA: Diagnosis not present

## 2013-08-17 DIAGNOSIS — Z952 Presence of prosthetic heart valve: Secondary | ICD-10-CM | POA: Diagnosis not present

## 2013-08-17 DIAGNOSIS — R918 Other nonspecific abnormal finding of lung field: Secondary | ICD-10-CM | POA: Diagnosis not present

## 2013-08-17 DIAGNOSIS — E162 Hypoglycemia, unspecified: Secondary | ICD-10-CM | POA: Diagnosis not present

## 2013-08-17 DIAGNOSIS — Z7982 Long term (current) use of aspirin: Secondary | ICD-10-CM | POA: Diagnosis not present

## 2013-08-17 DIAGNOSIS — Z452 Encounter for adjustment and management of vascular access device: Secondary | ICD-10-CM | POA: Diagnosis not present

## 2013-08-17 DIAGNOSIS — J9819 Other pulmonary collapse: Secondary | ICD-10-CM | POA: Diagnosis not present

## 2013-08-17 DIAGNOSIS — E785 Hyperlipidemia, unspecified: Secondary | ICD-10-CM | POA: Diagnosis not present

## 2013-08-17 DIAGNOSIS — Z9981 Dependence on supplemental oxygen: Secondary | ICD-10-CM | POA: Diagnosis not present

## 2013-08-17 DIAGNOSIS — N189 Chronic kidney disease, unspecified: Secondary | ICD-10-CM | POA: Diagnosis present

## 2013-08-17 LAB — URINALYSIS, COMPLETE
BACTERIA: NONE SEEN
BLOOD: NEGATIVE
Bilirubin,UR: NEGATIVE
GLUCOSE, UR: NEGATIVE mg/dL (ref 0–75)
KETONE: NEGATIVE
LEUKOCYTE ESTERASE: NEGATIVE
Nitrite: NEGATIVE
Ph: 6 (ref 4.5–8.0)
Protein: NEGATIVE
RBC,UR: 1 /HPF (ref 0–5)
Specific Gravity: 1.005 (ref 1.003–1.030)
WBC UR: <1 /HPF (ref 0–5)

## 2013-08-17 LAB — COMPREHENSIVE METABOLIC PANEL
ALBUMIN: 2.9 g/dL — AB (ref 3.4–5.0)
AST: 39 U/L — AB (ref 15–37)
Alkaline Phosphatase: 89 U/L
Anion Gap: 0 — ABNORMAL LOW (ref 7–16)
BILIRUBIN TOTAL: 0.5 mg/dL (ref 0.2–1.0)
BUN: 20 mg/dL — AB (ref 7–18)
CREATININE: 0.91 mg/dL (ref 0.60–1.30)
Calcium, Total: 9.1 mg/dL (ref 8.5–10.1)
Chloride: 99 mmol/L (ref 98–107)
Co2: 39 mmol/L — ABNORMAL HIGH (ref 21–32)
EGFR (African American): >60
EGFR (Non-African Amer.): 59 — ABNORMAL LOW
Glucose: 58 mg/dL — ABNORMAL LOW (ref 65–99)
Osmolality: 276 (ref 275–301)
Potassium: 4.6 mmol/L (ref 3.5–5.1)
SGPT (ALT): 25 U/L (ref 12–78)
Sodium: 138 mmol/L (ref 136–145)
Total Protein: 7.7 g/dL (ref 6.4–8.2)

## 2013-08-17 LAB — CBC
HCT: 36.3 % (ref 35.0–47.0)
HGB: 11.1 g/dL — AB (ref 12.0–16.0)
MCH: 27.3 pg (ref 26.0–34.0)
MCHC: 30.7 g/dL — ABNORMAL LOW (ref 32.0–36.0)
MCV: 89 fL (ref 80–100)
PLATELETS: 219 10*3/uL (ref 150–440)
RBC: 4.08 10*6/uL (ref 3.80–5.20)
RDW: 15 % — ABNORMAL HIGH (ref 11.5–14.5)
WBC: 8.3 10*3/uL (ref 3.6–11.0)

## 2013-08-17 LAB — CK TOTAL AND CKMB (NOT AT ARMC)
CK, TOTAL: 196 U/L — AB
CK-MB: 2.3 ng/mL (ref 0.5–3.6)

## 2013-08-17 LAB — APTT: ACTIVATED PTT: 32.1 s (ref 23.6–35.9)

## 2013-08-17 LAB — TROPONIN I
TROPONIN-I: 0.06 ng/mL — AB
Troponin-I: 0.02 ng/mL
Troponin-I: 0.1 ng/mL — ABNORMAL HIGH

## 2013-08-17 LAB — PRO B NATRIURETIC PEPTIDE: B-Type Natriuretic Peptide: 1532 pg/mL — ABNORMAL HIGH (ref 0–450)

## 2013-08-18 LAB — CBC WITH DIFFERENTIAL/PLATELET
BASOS ABS: 0.1 10*3/uL (ref 0.0–0.1)
Basophil #: 0.1 10*3/uL (ref 0.0–0.1)
Basophil %: 0.8 %
Basophil %: 0.6 %
EOS ABS: 0.1 10*3/uL (ref 0.0–0.7)
Eosinophil #: 0.1 10*3/uL (ref 0.0–0.7)
Eosinophil %: 1.5 %
Eosinophil %: 0.8 %
HCT: 33.9 % — AB (ref 35.0–47.0)
HCT: 33 % — ABNORMAL LOW (ref 35.0–47.0)
HGB: 10.9 g/dL — ABNORMAL LOW (ref 12.0–16.0)
HGB: 10.6 g/dL — AB (ref 12.0–16.0)
Lymphocyte #: 1.5 10*3/uL (ref 1.0–3.6)
Lymphocyte #: 0.8 10*3/uL — ABNORMAL LOW (ref 1.0–3.6)
Lymphocyte %: 18.4 %
Lymphocyte %: 9 %
MCH: 28.3 pg (ref 26.0–34.0)
MCH: 28.3 pg (ref 26.0–34.0)
MCHC: 32.1 g/dL (ref 32.0–36.0)
MCHC: 32.1 g/dL (ref 32.0–36.0)
MCV: 88 fL (ref 80–100)
MCV: 88 fL (ref 80–100)
MONOS PCT: 6.8 %
Monocyte #: 0.8 x10 3/mm (ref 0.2–0.9)
Monocyte #: 0.6 x10 3/mm (ref 0.2–0.9)
Monocyte %: 9.9 %
Neutrophil #: 5.6 10*3/uL (ref 1.4–6.5)
Neutrophil #: 7.8 10*3/uL — ABNORMAL HIGH (ref 1.4–6.5)
Neutrophil %: 69.4 %
Neutrophil %: 82.8 %
PLATELETS: 185 10*3/uL (ref 150–440)
Platelet: 185 10*3/uL (ref 150–440)
RBC: 3.85 10*6/uL (ref 3.80–5.20)
RBC: 3.74 10*6/uL — AB (ref 3.80–5.20)
RDW: 15.5 % — AB (ref 11.5–14.5)
RDW: 15.5 % — AB (ref 11.5–14.5)
WBC: 8 10*3/uL (ref 3.6–11.0)
WBC: 9.4 10*3/uL (ref 3.6–11.0)

## 2013-08-18 LAB — BASIC METABOLIC PANEL
Anion Gap: 3 — ABNORMAL LOW (ref 7–16)
BUN: 17 mg/dL (ref 7–18)
CALCIUM: 9.2 mg/dL (ref 8.5–10.1)
CREATININE: 0.91 mg/dL (ref 0.60–1.30)
Chloride: 95 mmol/L — ABNORMAL LOW (ref 98–107)
Co2: 41 mmol/L (ref 21–32)
EGFR (African American): >60
EGFR (Non-African Amer.): 59 — ABNORMAL LOW
Glucose: 86 mg/dL (ref 65–99)
Osmolality: 278 (ref 275–301)
Potassium: 3.7 mmol/L (ref 3.5–5.1)
Sodium: 139 mmol/L (ref 136–145)

## 2013-08-18 LAB — LIPID PANEL
Cholesterol: 160 mg/dL (ref 0–200)
HDL: 68 mg/dL — AB (ref 40–60)
Ldl Cholesterol, Calc: 76 mg/dL (ref 0–100)
Triglycerides: 80 mg/dL (ref 0–200)
VLDL CHOLESTEROL, CALC: 16 mg/dL (ref 5–40)

## 2013-08-18 LAB — APTT
Activated PTT: 71.9 secs — ABNORMAL HIGH (ref 23.6–35.9)
Activated PTT: 28.8 secs (ref 23.6–35.9)

## 2013-08-19 LAB — PROTIME-INR
INR: 1.2
PROTHROMBIN TIME: 14.6 s (ref 11.5–14.7)

## 2013-08-19 LAB — URINE CULTURE

## 2013-08-19 LAB — APTT
Activated PTT: 27.2 secs (ref 23.6–35.9)
Activated PTT: 130.1 secs — ABNORMAL HIGH (ref 23.6–35.9)
Activated PTT: 155 secs — ABNORMAL HIGH (ref 23.6–35.9)

## 2013-08-20 LAB — PLATELET COUNT: PLATELETS: 190 10*3/uL (ref 150–440)

## 2013-08-20 LAB — HEMOGLOBIN: HGB: 10 g/dL — ABNORMAL LOW (ref 12.0–16.0)

## 2013-08-20 LAB — PROTIME-INR
INR: 1.1
PROTHROMBIN TIME: 14.2 s (ref 11.5–14.7)

## 2013-08-20 LAB — APTT: ACTIVATED PTT: 92.4 s — AB (ref 23.6–35.9)

## 2013-08-21 LAB — PROTIME-INR
INR: 1.1
PROTHROMBIN TIME: 14.3 s (ref 11.5–14.7)

## 2013-08-21 LAB — BASIC METABOLIC PANEL
Anion Gap: 2 — ABNORMAL LOW (ref 7–16)
BUN: 24 mg/dL — AB (ref 7–18)
Calcium, Total: 9 mg/dL (ref 8.5–10.1)
Chloride: 93 mmol/L — ABNORMAL LOW (ref 98–107)
Co2: 41 mmol/L (ref 21–32)
Creatinine: 1.14 mg/dL (ref 0.60–1.30)
GFR CALC AF AMER: 52 — AB
GFR CALC NON AF AMER: 45 — AB
Glucose: 127 mg/dL — ABNORMAL HIGH (ref 65–99)
Osmolality: 278 (ref 275–301)
Potassium: 4.4 mmol/L (ref 3.5–5.1)
SODIUM: 136 mmol/L (ref 136–145)

## 2013-08-21 LAB — APTT
ACTIVATED PTT: 68.9 s — AB (ref 23.6–35.9)
Activated PTT: 140 secs — ABNORMAL HIGH (ref 23.6–35.9)

## 2013-08-22 LAB — PROTIME-INR
INR: 1.3
Prothrombin Time: 15.8 secs — ABNORMAL HIGH (ref 11.5–14.7)

## 2013-08-22 LAB — APTT
ACTIVATED PTT: 106.3 s — AB (ref 23.6–35.9)
Activated PTT: 127.2 secs — ABNORMAL HIGH (ref 23.6–35.9)

## 2013-08-22 LAB — HEMOGLOBIN: HGB: 10.1 g/dL — ABNORMAL LOW (ref 12.0–16.0)

## 2013-08-22 LAB — PLATELET COUNT: PLATELETS: 190 10*3/uL (ref 150–440)

## 2013-08-23 LAB — APTT
ACTIVATED PTT: 114.4 s — AB (ref 23.6–35.9)
ACTIVATED PTT: 89.9 s — AB (ref 23.6–35.9)

## 2013-08-23 LAB — PROTIME-INR
INR: 1.7
PROTHROMBIN TIME: 19.9 s — AB (ref 11.5–14.7)

## 2013-08-24 LAB — HEMOGLOBIN: HGB: 9.5 g/dL — AB (ref 12.0–16.0)

## 2013-08-24 LAB — PLATELET COUNT: PLATELETS: 197 10*3/uL (ref 150–440)

## 2013-08-24 LAB — PROTIME-INR
INR: 2
PROTHROMBIN TIME: 22.6 s — AB (ref 11.5–14.7)

## 2013-08-26 DIAGNOSIS — I5021 Acute systolic (congestive) heart failure: Secondary | ICD-10-CM | POA: Diagnosis not present

## 2013-08-26 DIAGNOSIS — E119 Type 2 diabetes mellitus without complications: Secondary | ICD-10-CM | POA: Diagnosis not present

## 2013-08-26 DIAGNOSIS — I10 Essential (primary) hypertension: Secondary | ICD-10-CM | POA: Diagnosis not present

## 2013-08-26 DIAGNOSIS — M6281 Muscle weakness (generalized): Secondary | ICD-10-CM | POA: Diagnosis not present

## 2013-08-29 DIAGNOSIS — I10 Essential (primary) hypertension: Secondary | ICD-10-CM | POA: Diagnosis not present

## 2013-08-29 DIAGNOSIS — Z7901 Long term (current) use of anticoagulants: Secondary | ICD-10-CM | POA: Diagnosis not present

## 2013-08-29 DIAGNOSIS — R609 Edema, unspecified: Secondary | ICD-10-CM | POA: Diagnosis not present

## 2013-08-29 DIAGNOSIS — G4720 Circadian rhythm sleep disorder, unspecified type: Secondary | ICD-10-CM | POA: Diagnosis not present

## 2013-08-29 DIAGNOSIS — I4891 Unspecified atrial fibrillation: Secondary | ICD-10-CM | POA: Diagnosis not present

## 2013-08-29 DIAGNOSIS — I509 Heart failure, unspecified: Secondary | ICD-10-CM | POA: Diagnosis not present

## 2013-08-29 DIAGNOSIS — E119 Type 2 diabetes mellitus without complications: Secondary | ICD-10-CM | POA: Diagnosis not present

## 2013-08-29 DIAGNOSIS — I110 Hypertensive heart disease with heart failure: Secondary | ICD-10-CM | POA: Diagnosis not present

## 2013-08-29 DIAGNOSIS — G4710 Hypersomnia, unspecified: Secondary | ICD-10-CM | POA: Diagnosis not present

## 2013-08-29 DIAGNOSIS — N183 Chronic kidney disease, stage 3 unspecified: Secondary | ICD-10-CM | POA: Diagnosis not present

## 2013-08-30 DIAGNOSIS — M6281 Muscle weakness (generalized): Secondary | ICD-10-CM | POA: Diagnosis not present

## 2013-08-30 DIAGNOSIS — I5021 Acute systolic (congestive) heart failure: Secondary | ICD-10-CM | POA: Diagnosis not present

## 2013-08-30 DIAGNOSIS — E119 Type 2 diabetes mellitus without complications: Secondary | ICD-10-CM | POA: Diagnosis not present

## 2013-08-30 DIAGNOSIS — I10 Essential (primary) hypertension: Secondary | ICD-10-CM | POA: Diagnosis not present

## 2013-09-02 DIAGNOSIS — I5021 Acute systolic (congestive) heart failure: Secondary | ICD-10-CM | POA: Diagnosis not present

## 2013-09-02 DIAGNOSIS — E119 Type 2 diabetes mellitus without complications: Secondary | ICD-10-CM | POA: Diagnosis not present

## 2013-09-02 DIAGNOSIS — M6281 Muscle weakness (generalized): Secondary | ICD-10-CM | POA: Diagnosis not present

## 2013-09-02 DIAGNOSIS — I10 Essential (primary) hypertension: Secondary | ICD-10-CM | POA: Diagnosis not present

## 2013-09-06 DIAGNOSIS — E119 Type 2 diabetes mellitus without complications: Secondary | ICD-10-CM | POA: Diagnosis not present

## 2013-09-06 DIAGNOSIS — I5021 Acute systolic (congestive) heart failure: Secondary | ICD-10-CM | POA: Diagnosis not present

## 2013-09-06 DIAGNOSIS — I10 Essential (primary) hypertension: Secondary | ICD-10-CM | POA: Diagnosis not present

## 2013-09-06 DIAGNOSIS — M6281 Muscle weakness (generalized): Secondary | ICD-10-CM | POA: Diagnosis not present

## 2013-09-07 ENCOUNTER — Inpatient Hospital Stay: Payer: Self-pay | Admitting: Internal Medicine

## 2013-09-07 DIAGNOSIS — N390 Urinary tract infection, site not specified: Secondary | ICD-10-CM | POA: Diagnosis present

## 2013-09-07 DIAGNOSIS — I5032 Chronic diastolic (congestive) heart failure: Secondary | ICD-10-CM | POA: Diagnosis present

## 2013-09-07 DIAGNOSIS — J4489 Other specified chronic obstructive pulmonary disease: Secondary | ICD-10-CM | POA: Diagnosis not present

## 2013-09-07 DIAGNOSIS — G4730 Sleep apnea, unspecified: Secondary | ICD-10-CM | POA: Diagnosis not present

## 2013-09-07 DIAGNOSIS — Z6841 Body Mass Index (BMI) 40.0 and over, adult: Secondary | ICD-10-CM | POA: Diagnosis not present

## 2013-09-07 DIAGNOSIS — K219 Gastro-esophageal reflux disease without esophagitis: Secondary | ICD-10-CM | POA: Diagnosis present

## 2013-09-07 DIAGNOSIS — Z7982 Long term (current) use of aspirin: Secondary | ICD-10-CM | POA: Diagnosis not present

## 2013-09-07 DIAGNOSIS — Z9981 Dependence on supplemental oxygen: Secondary | ICD-10-CM | POA: Diagnosis not present

## 2013-09-07 DIAGNOSIS — I509 Heart failure, unspecified: Secondary | ICD-10-CM | POA: Diagnosis present

## 2013-09-07 DIAGNOSIS — N3000 Acute cystitis without hematuria: Secondary | ICD-10-CM | POA: Diagnosis not present

## 2013-09-07 DIAGNOSIS — R109 Unspecified abdominal pain: Secondary | ICD-10-CM | POA: Diagnosis not present

## 2013-09-07 DIAGNOSIS — Z87891 Personal history of nicotine dependence: Secondary | ICD-10-CM | POA: Diagnosis not present

## 2013-09-07 DIAGNOSIS — K859 Acute pancreatitis without necrosis or infection, unspecified: Secondary | ICD-10-CM | POA: Diagnosis not present

## 2013-09-07 DIAGNOSIS — Z950 Presence of cardiac pacemaker: Secondary | ICD-10-CM | POA: Diagnosis not present

## 2013-09-07 DIAGNOSIS — I10 Essential (primary) hypertension: Secondary | ICD-10-CM | POA: Diagnosis present

## 2013-09-07 DIAGNOSIS — E119 Type 2 diabetes mellitus without complications: Secondary | ICD-10-CM | POA: Diagnosis present

## 2013-09-07 DIAGNOSIS — K8020 Calculus of gallbladder without cholecystitis without obstruction: Secondary | ICD-10-CM | POA: Diagnosis present

## 2013-09-07 DIAGNOSIS — K551 Chronic vascular disorders of intestine: Secondary | ICD-10-CM | POA: Diagnosis not present

## 2013-09-07 DIAGNOSIS — J9610 Chronic respiratory failure, unspecified whether with hypoxia or hypercapnia: Secondary | ICD-10-CM | POA: Diagnosis not present

## 2013-09-07 DIAGNOSIS — J449 Chronic obstructive pulmonary disease, unspecified: Secondary | ICD-10-CM | POA: Diagnosis not present

## 2013-09-07 DIAGNOSIS — Z7901 Long term (current) use of anticoagulants: Secondary | ICD-10-CM | POA: Diagnosis not present

## 2013-09-07 DIAGNOSIS — A498 Other bacterial infections of unspecified site: Secondary | ICD-10-CM | POA: Diagnosis present

## 2013-09-07 DIAGNOSIS — Z954 Presence of other heart-valve replacement: Secondary | ICD-10-CM | POA: Diagnosis not present

## 2013-09-07 DIAGNOSIS — R6889 Other general symptoms and signs: Secondary | ICD-10-CM | POA: Diagnosis not present

## 2013-09-07 DIAGNOSIS — Z794 Long term (current) use of insulin: Secondary | ICD-10-CM | POA: Diagnosis not present

## 2013-09-07 DIAGNOSIS — R110 Nausea: Secondary | ICD-10-CM | POA: Diagnosis not present

## 2013-09-07 DIAGNOSIS — Z86711 Personal history of pulmonary embolism: Secondary | ICD-10-CM | POA: Diagnosis not present

## 2013-09-07 LAB — URINALYSIS, COMPLETE
Bilirubin,UR: NEGATIVE
Blood: NEGATIVE
Glucose,UR: NEGATIVE mg/dL (ref 0–75)
KETONE: NEGATIVE
NITRITE: NEGATIVE
PH: 8 (ref 4.5–8.0)
Protein: 100
Specific Gravity: 1.012 (ref 1.003–1.030)
Squamous Epithelial: 2
WBC UR: 330 /HPF (ref 0–5)

## 2013-09-07 LAB — COMPREHENSIVE METABOLIC PANEL
ALBUMIN: 3 g/dL — AB (ref 3.4–5.0)
ALK PHOS: 80 U/L
AST: 26 U/L (ref 15–37)
Anion Gap: 2 — ABNORMAL LOW (ref 7–16)
BUN: 16 mg/dL (ref 7–18)
Bilirubin,Total: 0.3 mg/dL (ref 0.2–1.0)
CO2: 36 mmol/L — AB (ref 21–32)
Calcium, Total: 8.8 mg/dL (ref 8.5–10.1)
Chloride: 100 mmol/L (ref 98–107)
Creatinine: 1.01 mg/dL (ref 0.60–1.30)
EGFR (African American): >60
GFR CALC NON AF AMER: 52 — AB
Glucose: 79 mg/dL (ref 65–99)
OSMOLALITY: 276 (ref 275–301)
Potassium: 4 mmol/L (ref 3.5–5.1)
SGPT (ALT): 18 U/L (ref 12–78)
SODIUM: 138 mmol/L (ref 136–145)
TOTAL PROTEIN: 7.9 g/dL (ref 6.4–8.2)

## 2013-09-07 LAB — CBC WITH DIFFERENTIAL/PLATELET
BASOS PCT: 0.6 %
Basophil #: 0.1 10*3/uL (ref 0.0–0.1)
Eosinophil #: 0.2 10*3/uL (ref 0.0–0.7)
Eosinophil %: 2.4 %
HCT: 33 % — AB (ref 35.0–47.0)
HGB: 10 g/dL — AB (ref 12.0–16.0)
LYMPHS PCT: 16 %
Lymphocyte #: 1.3 10*3/uL (ref 1.0–3.6)
MCH: 26.7 pg (ref 26.0–34.0)
MCHC: 30.4 g/dL — AB (ref 32.0–36.0)
MCV: 88 fL (ref 80–100)
MONOS PCT: 9.1 %
Monocyte #: 0.8 x10 3/mm (ref 0.2–0.9)
NEUTROS ABS: 6.1 10*3/uL (ref 1.4–6.5)
Neutrophil %: 71.9 %
Platelet: 339 10*3/uL (ref 150–440)
RBC: 3.75 10*6/uL — AB (ref 3.80–5.20)
RDW: 16 % — ABNORMAL HIGH (ref 11.5–14.5)
WBC: 8.4 10*3/uL (ref 3.6–11.0)

## 2013-09-07 LAB — LIPASE, BLOOD: Lipase: 5902 U/L — ABNORMAL HIGH (ref 73–393)

## 2013-09-07 LAB — PROTIME-INR
INR: 2
Prothrombin Time: 21.8 secs — ABNORMAL HIGH (ref 11.5–14.7)

## 2013-09-07 LAB — TROPONIN I: TROPONIN-I: 0.02 ng/mL

## 2013-09-08 DIAGNOSIS — K859 Acute pancreatitis without necrosis or infection, unspecified: Secondary | ICD-10-CM | POA: Diagnosis not present

## 2013-09-08 DIAGNOSIS — J9610 Chronic respiratory failure, unspecified whether with hypoxia or hypercapnia: Secondary | ICD-10-CM | POA: Diagnosis not present

## 2013-09-08 DIAGNOSIS — G4730 Sleep apnea, unspecified: Secondary | ICD-10-CM | POA: Diagnosis not present

## 2013-09-08 DIAGNOSIS — K551 Chronic vascular disorders of intestine: Secondary | ICD-10-CM | POA: Diagnosis not present

## 2013-09-08 DIAGNOSIS — I10 Essential (primary) hypertension: Secondary | ICD-10-CM | POA: Diagnosis not present

## 2013-09-08 LAB — CBC WITH DIFFERENTIAL/PLATELET
Basophil #: 0 10*3/uL (ref 0.0–0.1)
Basophil %: 0.5 %
EOS ABS: 0.1 10*3/uL (ref 0.0–0.7)
EOS PCT: 1.3 %
HCT: 27.7 % — AB (ref 35.0–47.0)
HGB: 9 g/dL — AB (ref 12.0–16.0)
Lymphocyte #: 1.4 10*3/uL (ref 1.0–3.6)
Lymphocyte %: 17.9 %
MCH: 28.6 pg (ref 26.0–34.0)
MCHC: 32.6 g/dL (ref 32.0–36.0)
MCV: 88 fL (ref 80–100)
MONO ABS: 0.8 x10 3/mm (ref 0.2–0.9)
MONOS PCT: 10.4 %
Neutrophil #: 5.3 10*3/uL (ref 1.4–6.5)
Neutrophil %: 69.9 %
PLATELETS: 269 10*3/uL (ref 150–440)
RBC: 3.16 10*6/uL — ABNORMAL LOW (ref 3.80–5.20)
RDW: 15.8 % — ABNORMAL HIGH (ref 11.5–14.5)
WBC: 7.6 10*3/uL (ref 3.6–11.0)

## 2013-09-08 LAB — BASIC METABOLIC PANEL
Anion Gap: 0 — ABNORMAL LOW (ref 7–16)
BUN: 15 mg/dL (ref 7–18)
CHLORIDE: 101 mmol/L (ref 98–107)
CO2: 36 mmol/L — AB (ref 21–32)
Calcium, Total: 8.4 mg/dL — ABNORMAL LOW (ref 8.5–10.1)
Creatinine: 0.98 mg/dL (ref 0.60–1.30)
EGFR (African American): >60
GFR CALC NON AF AMER: 54 — AB
Glucose: 85 mg/dL (ref 65–99)
Osmolality: 274 (ref 275–301)
Potassium: 3.8 mmol/L (ref 3.5–5.1)
SODIUM: 137 mmol/L (ref 136–145)

## 2013-09-08 LAB — LIPASE, BLOOD: LIPASE: 2110 U/L — AB (ref 73–393)

## 2013-09-09 DIAGNOSIS — J9610 Chronic respiratory failure, unspecified whether with hypoxia or hypercapnia: Secondary | ICD-10-CM | POA: Diagnosis not present

## 2013-09-09 DIAGNOSIS — K859 Acute pancreatitis without necrosis or infection, unspecified: Secondary | ICD-10-CM | POA: Diagnosis not present

## 2013-09-09 DIAGNOSIS — G4730 Sleep apnea, unspecified: Secondary | ICD-10-CM | POA: Diagnosis not present

## 2013-09-09 DIAGNOSIS — I10 Essential (primary) hypertension: Secondary | ICD-10-CM | POA: Diagnosis not present

## 2013-09-09 LAB — CBC WITH DIFFERENTIAL/PLATELET
BASOS PCT: 0.7 %
Basophil #: 0 10*3/uL (ref 0.0–0.1)
EOS ABS: 0.2 10*3/uL (ref 0.0–0.7)
Eosinophil %: 2.9 %
HCT: 29.6 % — AB (ref 35.0–47.0)
HGB: 9.5 g/dL — ABNORMAL LOW (ref 12.0–16.0)
LYMPHS ABS: 1 10*3/uL (ref 1.0–3.6)
Lymphocyte %: 14.6 %
MCH: 27.9 pg (ref 26.0–34.0)
MCHC: 32 g/dL (ref 32.0–36.0)
MCV: 87 fL (ref 80–100)
Monocyte #: 0.7 x10 3/mm (ref 0.2–0.9)
Monocyte %: 10.6 %
NEUTROS ABS: 4.9 10*3/uL (ref 1.4–6.5)
Neutrophil %: 71.2 %
Platelet: 269 10*3/uL (ref 150–440)
RBC: 3.39 10*6/uL — ABNORMAL LOW (ref 3.80–5.20)
RDW: 15.8 % — AB (ref 11.5–14.5)
WBC: 6.9 10*3/uL (ref 3.6–11.0)

## 2013-09-09 LAB — BASIC METABOLIC PANEL
Anion Gap: 4 — ABNORMAL LOW (ref 7–16)
BUN: 16 mg/dL (ref 7–18)
Calcium, Total: 9.1 mg/dL (ref 8.5–10.1)
Chloride: 101 mmol/L (ref 98–107)
Co2: 32 mmol/L (ref 21–32)
Creatinine: 1.03 mg/dL (ref 0.60–1.30)
EGFR (African American): 59 — ABNORMAL LOW
EGFR (Non-African Amer.): 51 — ABNORMAL LOW
Glucose: 88 mg/dL (ref 65–99)
OSMOLALITY: 274 (ref 275–301)
Potassium: 3.9 mmol/L (ref 3.5–5.1)
SODIUM: 137 mmol/L (ref 136–145)

## 2013-09-09 LAB — CANCER ANTIGEN 19-9: CA 19-9: 27 U/mL (ref 0–35)

## 2013-09-09 LAB — LIPASE, BLOOD: Lipase: 1373 U/L — ABNORMAL HIGH (ref 73–393)

## 2013-09-09 LAB — HEPATIC FUNCTION PANEL A (ARMC)
ALK PHOS: 61 U/L
ALT: 12 U/L (ref 12–78)
AST: 19 U/L (ref 15–37)
Albumin: 2.6 g/dL — ABNORMAL LOW (ref 3.4–5.0)
Bilirubin, Direct: <0.1 mg/dL (ref 0.00–0.20)
Bilirubin,Total: 0.4 mg/dL (ref 0.2–1.0)
TOTAL PROTEIN: 7 g/dL (ref 6.4–8.2)

## 2013-09-09 LAB — URINE CULTURE

## 2013-09-09 LAB — PROTIME-INR
INR: 2.2
PROTHROMBIN TIME: 23.7 s — AB (ref 11.5–14.7)

## 2013-09-09 LAB — CEA: CEA: 3.8 ng/mL (ref 0.0–4.7)

## 2013-09-10 DIAGNOSIS — I10 Essential (primary) hypertension: Secondary | ICD-10-CM | POA: Diagnosis not present

## 2013-09-10 DIAGNOSIS — J9610 Chronic respiratory failure, unspecified whether with hypoxia or hypercapnia: Secondary | ICD-10-CM | POA: Diagnosis not present

## 2013-09-10 DIAGNOSIS — G4730 Sleep apnea, unspecified: Secondary | ICD-10-CM | POA: Diagnosis not present

## 2013-09-10 DIAGNOSIS — K859 Acute pancreatitis without necrosis or infection, unspecified: Secondary | ICD-10-CM | POA: Diagnosis not present

## 2013-09-10 LAB — CBC WITH DIFFERENTIAL/PLATELET
Basophil #: 0 10*3/uL (ref 0.0–0.1)
Basophil %: 0.6 %
Eosinophil #: 0.2 10*3/uL (ref 0.0–0.7)
Eosinophil %: 3.4 %
HCT: 30.9 % — AB (ref 35.0–47.0)
HGB: 9.9 g/dL — ABNORMAL LOW (ref 12.0–16.0)
Lymphocyte #: 1.2 10*3/uL (ref 1.0–3.6)
Lymphocyte %: 17.9 %
MCH: 28 pg (ref 26.0–34.0)
MCHC: 31.9 g/dL — AB (ref 32.0–36.0)
MCV: 88 fL (ref 80–100)
MONOS PCT: 9.7 %
Monocyte #: 0.7 x10 3/mm (ref 0.2–0.9)
NEUTROS ABS: 4.6 10*3/uL (ref 1.4–6.5)
Neutrophil %: 68.4 %
PLATELETS: 277 10*3/uL (ref 150–440)
RBC: 3.53 10*6/uL — AB (ref 3.80–5.20)
RDW: 15.7 % — ABNORMAL HIGH (ref 11.5–14.5)
WBC: 6.8 10*3/uL (ref 3.6–11.0)

## 2013-09-10 LAB — BASIC METABOLIC PANEL
ANION GAP: 2 — AB (ref 7–16)
BUN: 12 mg/dL (ref 7–18)
CHLORIDE: 99 mmol/L (ref 98–107)
CO2: 35 mmol/L — AB (ref 21–32)
Calcium, Total: 9 mg/dL (ref 8.5–10.1)
Creatinine: 1.04 mg/dL (ref 0.60–1.30)
EGFR (Non-African Amer.): 50 — ABNORMAL LOW
GFR CALC AF AMER: 58 — AB
Glucose: 184 mg/dL — ABNORMAL HIGH (ref 65–99)
Osmolality: 276 (ref 275–301)
Potassium: 4.3 mmol/L (ref 3.5–5.1)
Sodium: 136 mmol/L (ref 136–145)

## 2013-09-10 LAB — PROTIME-INR
INR: 2.7
PROTHROMBIN TIME: 28.2 s — AB (ref 11.5–14.7)

## 2013-09-11 DIAGNOSIS — G4730 Sleep apnea, unspecified: Secondary | ICD-10-CM | POA: Diagnosis not present

## 2013-09-11 DIAGNOSIS — K859 Acute pancreatitis without necrosis or infection, unspecified: Secondary | ICD-10-CM | POA: Diagnosis not present

## 2013-09-11 DIAGNOSIS — J9610 Chronic respiratory failure, unspecified whether with hypoxia or hypercapnia: Secondary | ICD-10-CM | POA: Diagnosis not present

## 2013-09-11 DIAGNOSIS — I10 Essential (primary) hypertension: Secondary | ICD-10-CM | POA: Diagnosis not present

## 2013-09-11 LAB — PROTIME-INR
INR: 3.1
Prothrombin Time: 31.1 secs — ABNORMAL HIGH (ref 11.5–14.7)

## 2013-09-16 DIAGNOSIS — I5021 Acute systolic (congestive) heart failure: Secondary | ICD-10-CM | POA: Diagnosis not present

## 2013-09-16 DIAGNOSIS — E119 Type 2 diabetes mellitus without complications: Secondary | ICD-10-CM | POA: Diagnosis not present

## 2013-09-16 DIAGNOSIS — I10 Essential (primary) hypertension: Secondary | ICD-10-CM | POA: Diagnosis not present

## 2013-09-16 DIAGNOSIS — M6281 Muscle weakness (generalized): Secondary | ICD-10-CM | POA: Diagnosis not present

## 2013-09-19 DIAGNOSIS — E119 Type 2 diabetes mellitus without complications: Secondary | ICD-10-CM | POA: Diagnosis not present

## 2013-09-19 DIAGNOSIS — M6281 Muscle weakness (generalized): Secondary | ICD-10-CM | POA: Diagnosis not present

## 2013-09-19 DIAGNOSIS — I5021 Acute systolic (congestive) heart failure: Secondary | ICD-10-CM | POA: Diagnosis not present

## 2013-09-19 DIAGNOSIS — I10 Essential (primary) hypertension: Secondary | ICD-10-CM | POA: Diagnosis not present

## 2013-09-20 DIAGNOSIS — M6281 Muscle weakness (generalized): Secondary | ICD-10-CM | POA: Diagnosis not present

## 2013-09-20 DIAGNOSIS — E119 Type 2 diabetes mellitus without complications: Secondary | ICD-10-CM | POA: Diagnosis not present

## 2013-09-20 DIAGNOSIS — I10 Essential (primary) hypertension: Secondary | ICD-10-CM | POA: Diagnosis not present

## 2013-09-20 DIAGNOSIS — I5021 Acute systolic (congestive) heart failure: Secondary | ICD-10-CM | POA: Diagnosis not present

## 2013-09-21 DIAGNOSIS — I5021 Acute systolic (congestive) heart failure: Secondary | ICD-10-CM | POA: Diagnosis not present

## 2013-09-21 DIAGNOSIS — E119 Type 2 diabetes mellitus without complications: Secondary | ICD-10-CM | POA: Diagnosis not present

## 2013-09-21 DIAGNOSIS — M6281 Muscle weakness (generalized): Secondary | ICD-10-CM | POA: Diagnosis not present

## 2013-09-21 DIAGNOSIS — I10 Essential (primary) hypertension: Secondary | ICD-10-CM | POA: Diagnosis not present

## 2013-09-22 DIAGNOSIS — I5021 Acute systolic (congestive) heart failure: Secondary | ICD-10-CM | POA: Diagnosis not present

## 2013-09-22 DIAGNOSIS — E119 Type 2 diabetes mellitus without complications: Secondary | ICD-10-CM | POA: Diagnosis not present

## 2013-09-22 DIAGNOSIS — I10 Essential (primary) hypertension: Secondary | ICD-10-CM | POA: Diagnosis not present

## 2013-09-22 DIAGNOSIS — M6281 Muscle weakness (generalized): Secondary | ICD-10-CM | POA: Diagnosis not present

## 2013-09-23 ENCOUNTER — Emergency Department (HOSPITAL_COMMUNITY): Payer: Medicare Other

## 2013-09-23 ENCOUNTER — Inpatient Hospital Stay (HOSPITAL_COMMUNITY): Payer: Medicare Other

## 2013-09-23 ENCOUNTER — Inpatient Hospital Stay (HOSPITAL_COMMUNITY)
Admission: EM | Admit: 2013-09-23 | Discharge: 2013-09-29 | DRG: 291 | Disposition: A | Discharge status: Home Health | Payer: Medicare Other | Attending: Internal Medicine | Admitting: Internal Medicine

## 2013-09-23 ENCOUNTER — Encounter (HOSPITAL_COMMUNITY): Payer: Self-pay | Admitting: Emergency Medicine

## 2013-09-23 DIAGNOSIS — E119 Type 2 diabetes mellitus without complications: Secondary | ICD-10-CM | POA: Diagnosis present

## 2013-09-23 DIAGNOSIS — E872 Acidosis, unspecified: Secondary | ICD-10-CM | POA: Diagnosis present

## 2013-09-23 DIAGNOSIS — Z7982 Long term (current) use of aspirin: Secondary | ICD-10-CM | POA: Diagnosis not present

## 2013-09-23 DIAGNOSIS — E162 Hypoglycemia, unspecified: Secondary | ICD-10-CM | POA: Diagnosis not present

## 2013-09-23 DIAGNOSIS — I5033 Acute on chronic diastolic (congestive) heart failure: Secondary | ICD-10-CM | POA: Diagnosis present

## 2013-09-23 DIAGNOSIS — Z950 Presence of cardiac pacemaker: Secondary | ICD-10-CM | POA: Diagnosis not present

## 2013-09-23 DIAGNOSIS — Z794 Long term (current) use of insulin: Secondary | ICD-10-CM | POA: Diagnosis not present

## 2013-09-23 DIAGNOSIS — E669 Obesity, unspecified: Secondary | ICD-10-CM | POA: Diagnosis present

## 2013-09-23 DIAGNOSIS — R5383 Other fatigue: Secondary | ICD-10-CM | POA: Diagnosis not present

## 2013-09-23 DIAGNOSIS — Z86711 Personal history of pulmonary embolism: Secondary | ICD-10-CM | POA: Diagnosis not present

## 2013-09-23 DIAGNOSIS — I10 Essential (primary) hypertension: Secondary | ICD-10-CM | POA: Diagnosis not present

## 2013-09-23 DIAGNOSIS — E8729 Other acidosis: Secondary | ICD-10-CM

## 2013-09-23 DIAGNOSIS — I2699 Other pulmonary embolism without acute cor pulmonale: Secondary | ICD-10-CM

## 2013-09-23 DIAGNOSIS — D72829 Elevated white blood cell count, unspecified: Secondary | ICD-10-CM | POA: Diagnosis present

## 2013-09-23 DIAGNOSIS — E1059 Type 1 diabetes mellitus with other circulatory complications: Secondary | ICD-10-CM | POA: Diagnosis not present

## 2013-09-23 DIAGNOSIS — Z79899 Other long term (current) drug therapy: Secondary | ICD-10-CM | POA: Diagnosis not present

## 2013-09-23 DIAGNOSIS — J811 Chronic pulmonary edema: Secondary | ICD-10-CM

## 2013-09-23 DIAGNOSIS — I509 Heart failure, unspecified: Secondary | ICD-10-CM

## 2013-09-23 DIAGNOSIS — N179 Acute kidney failure, unspecified: Secondary | ICD-10-CM

## 2013-09-23 DIAGNOSIS — G4710 Hypersomnia, unspecified: Secondary | ICD-10-CM | POA: Diagnosis not present

## 2013-09-23 DIAGNOSIS — J9601 Acute respiratory failure with hypoxia: Secondary | ICD-10-CM

## 2013-09-23 DIAGNOSIS — I5031 Acute diastolic (congestive) heart failure: Secondary | ICD-10-CM

## 2013-09-23 DIAGNOSIS — R404 Transient alteration of awareness: Secondary | ICD-10-CM | POA: Diagnosis not present

## 2013-09-23 DIAGNOSIS — R609 Edema, unspecified: Secondary | ICD-10-CM | POA: Diagnosis not present

## 2013-09-23 DIAGNOSIS — R5381 Other malaise: Secondary | ICD-10-CM | POA: Diagnosis not present

## 2013-09-23 DIAGNOSIS — Z954 Presence of other heart-valve replacement: Secondary | ICD-10-CM

## 2013-09-23 DIAGNOSIS — R0902 Hypoxemia: Secondary | ICD-10-CM | POA: Diagnosis not present

## 2013-09-23 DIAGNOSIS — J9600 Acute respiratory failure, unspecified whether with hypoxia or hypercapnia: Secondary | ICD-10-CM

## 2013-09-23 DIAGNOSIS — R0602 Shortness of breath: Secondary | ICD-10-CM | POA: Diagnosis not present

## 2013-09-23 DIAGNOSIS — T502X5A Adverse effect of carbonic-anhydrase inhibitors, benzothiadiazides and other diuretics, initial encounter: Secondary | ICD-10-CM | POA: Diagnosis not present

## 2013-09-23 DIAGNOSIS — J90 Pleural effusion, not elsewhere classified: Secondary | ICD-10-CM | POA: Diagnosis not present

## 2013-09-23 DIAGNOSIS — J9602 Acute respiratory failure with hypercapnia: Secondary | ICD-10-CM

## 2013-09-23 DIAGNOSIS — I110 Hypertensive heart disease with heart failure: Secondary | ICD-10-CM | POA: Diagnosis not present

## 2013-09-23 DIAGNOSIS — G4730 Sleep apnea, unspecified: Secondary | ICD-10-CM | POA: Diagnosis not present

## 2013-09-23 DIAGNOSIS — N390 Urinary tract infection, site not specified: Secondary | ICD-10-CM | POA: Diagnosis not present

## 2013-09-23 DIAGNOSIS — I059 Rheumatic mitral valve disease, unspecified: Secondary | ICD-10-CM | POA: Diagnosis not present

## 2013-09-23 HISTORY — DX: Heart failure, unspecified: I50.9

## 2013-09-23 LAB — COMPREHENSIVE METABOLIC PANEL
ALBUMIN: 3.2 g/dL — AB (ref 3.5–5.2)
ALK PHOS: 92 U/L (ref 39–117)
ALT: 20 U/L (ref 0–35)
ALT: 22 U/L (ref 0–35)
AST: 34 U/L (ref 0–37)
AST: 32 U/L (ref 0–37)
Albumin: 3 g/dL — ABNORMAL LOW (ref 3.5–5.2)
Alkaline Phosphatase: 86 U/L (ref 39–117)
BILIRUBIN TOTAL: 0.5 mg/dL (ref 0.3–1.2)
BILIRUBIN TOTAL: 0.5 mg/dL (ref 0.3–1.2)
BUN: 16 mg/dL (ref 6–23)
BUN: 18 mg/dL (ref 6–23)
CHLORIDE: 93 meq/L — AB (ref 96–112)
CHLORIDE: 92 meq/L — AB (ref 96–112)
CO2: 31 mEq/L (ref 19–32)
CO2: 32 meq/L (ref 19–32)
Calcium: 9.1 mg/dL (ref 8.4–10.5)
Calcium: 9 mg/dL (ref 8.4–10.5)
Creatinine, Ser: 0.85 mg/dL (ref 0.50–1.10)
Creatinine, Ser: 0.85 mg/dL (ref 0.50–1.10)
GFR calc Af Amer: 72 mL/min — ABNORMAL LOW (ref >90)
GFR calc Af Amer: 72 mL/min — ABNORMAL LOW (ref >90)
GFR calc non Af Amer: 63 mL/min — ABNORMAL LOW (ref >90)
GFR, EST NON AFRICAN AMERICAN: 63 mL/min — AB (ref >90)
Glucose, Bld: 282 mg/dL — ABNORMAL HIGH (ref 70–99)
Glucose, Bld: 289 mg/dL — ABNORMAL HIGH (ref 70–99)
POTASSIUM: 4.9 meq/L (ref 3.7–5.3)
Potassium: 4.9 mEq/L (ref 3.7–5.3)
SODIUM: 136 meq/L — AB (ref 137–147)
Sodium: 136 mEq/L — ABNORMAL LOW (ref 137–147)
Total Protein: 7.7 g/dL (ref 6.0–8.3)
Total Protein: 7.2 g/dL (ref 6.0–8.3)

## 2013-09-23 LAB — I-STAT ARTERIAL BLOOD GAS, ED
Acid-Base Excess: 8 mmol/L — ABNORMAL HIGH (ref 0.0–2.0)
BICARBONATE: 37 meq/L — AB (ref 20.0–24.0)
O2 Saturation: 100 %
PCO2 ART: 76.9 mmHg — AB (ref 35.0–45.0)
PH ART: 7.291 — AB (ref 7.350–7.450)
Patient temperature: 98.6
TCO2: 39 mmol/L (ref 0–100)
pO2, Arterial: 270 mmHg — ABNORMAL HIGH (ref 80.0–100.0)

## 2013-09-23 LAB — URINALYSIS, ROUTINE W REFLEX MICROSCOPIC
Bilirubin Urine: NEGATIVE
Bilirubin Urine: NEGATIVE
GLUCOSE, UA: 100 mg/dL — AB
Glucose, UA: 250 mg/dL — AB
HGB URINE DIPSTICK: NEGATIVE
Ketones, ur: NEGATIVE mg/dL
Ketones, ur: NEGATIVE mg/dL
LEUKOCYTES UA: NEGATIVE
Leukocytes, UA: NEGATIVE
NITRITE: NEGATIVE
Nitrite: NEGATIVE
PH: 6 (ref 5.0–8.0)
Protein, ur: 100 mg/dL — AB
Protein, ur: 30 mg/dL — AB
SPECIFIC GRAVITY, URINE: 1.021 (ref 1.005–1.030)
SPECIFIC GRAVITY, URINE: 1.016 (ref 1.005–1.030)
UROBILINOGEN UA: 0.2 mg/dL (ref 0.0–1.0)
Urobilinogen, UA: 0.2 mg/dL (ref 0.0–1.0)
pH: 6.5 (ref 5.0–8.0)

## 2013-09-23 LAB — POCT I-STAT 3, ART BLOOD GAS (G3+)
Acid-Base Excess: 8 mmol/L — ABNORMAL HIGH (ref 0.0–2.0)
Bicarbonate: 34.9 mEq/L — ABNORMAL HIGH (ref 20.0–24.0)
O2 Saturation: 94 %
Patient temperature: 98.6
TCO2: 37 mmol/L (ref 0–100)
pCO2 arterial: 59.1 mmHg (ref 35.0–45.0)
pH, Arterial: 7.38 (ref 7.350–7.450)
pO2, Arterial: 73 mmHg — ABNORMAL LOW (ref 80.0–100.0)

## 2013-09-23 LAB — I-STAT CG4 LACTIC ACID, ED: LACTIC ACID, VENOUS: 1.09 mmol/L (ref 0.5–2.2)

## 2013-09-23 LAB — I-STAT CHEM 8, ED
BUN: 19 mg/dL (ref 6–23)
CALCIUM ION: 1.16 mmol/L (ref 1.13–1.30)
CHLORIDE: 93 meq/L — AB (ref 96–112)
Creatinine, Ser: 1 mg/dL (ref 0.50–1.10)
Glucose, Bld: 283 mg/dL — ABNORMAL HIGH (ref 70–99)
HEMATOCRIT: 32 % — AB (ref 36.0–46.0)
Hemoglobin: 10.9 g/dL — ABNORMAL LOW (ref 12.0–15.0)
Potassium: 4.7 mEq/L (ref 3.7–5.3)
Sodium: 136 mEq/L — ABNORMAL LOW (ref 137–147)
TCO2: 36 mmol/L (ref 0–100)

## 2013-09-23 LAB — GLUCOSE, CAPILLARY
GLUCOSE-CAPILLARY: 171 mg/dL — AB (ref 70–99)
GLUCOSE-CAPILLARY: 149 mg/dL — AB (ref 70–99)
Glucose-Capillary: 227 mg/dL — ABNORMAL HIGH (ref 70–99)
Glucose-Capillary: 269 mg/dL — ABNORMAL HIGH (ref 70–99)
Glucose-Capillary: 270 mg/dL — ABNORMAL HIGH (ref 70–99)
Glucose-Capillary: 276 mg/dL — ABNORMAL HIGH (ref 70–99)
Glucose-Capillary: 74 mg/dL (ref 70–99)

## 2013-09-23 LAB — PROTIME-INR
INR: 1.86 — ABNORMAL HIGH (ref 0.00–1.49)
INR: 1.91 — ABNORMAL HIGH (ref 0.00–1.49)
Prothrombin Time: 20.9 seconds — ABNORMAL HIGH (ref 11.6–15.2)
Prothrombin Time: 21.3 seconds — ABNORMAL HIGH (ref 11.6–15.2)

## 2013-09-23 LAB — CBC WITH DIFFERENTIAL/PLATELET
BASOS ABS: 0 10*3/uL (ref 0.0–0.1)
BASOS PCT: 0 % (ref 0–1)
Basophils Absolute: 0 10*3/uL (ref 0.0–0.1)
Basophils Relative: 0 % (ref 0–1)
Eosinophils Absolute: 0.1 10*3/uL (ref 0.0–0.7)
Eosinophils Absolute: 0 10*3/uL (ref 0.0–0.7)
Eosinophils Relative: 1 % (ref 0–5)
Eosinophils Relative: 0 % (ref 0–5)
HCT: 31 % — ABNORMAL LOW (ref 36.0–46.0)
HCT: 27.5 % — ABNORMAL LOW (ref 36.0–46.0)
HEMOGLOBIN: 9.8 g/dL — AB (ref 12.0–15.0)
Hemoglobin: 8.6 g/dL — ABNORMAL LOW (ref 12.0–15.0)
LYMPHS PCT: 9 % — AB (ref 12–46)
LYMPHS PCT: 12 % (ref 12–46)
Lymphs Abs: 1.2 10*3/uL (ref 0.7–4.0)
Lymphs Abs: 1.2 10*3/uL (ref 0.7–4.0)
MCH: 27.9 pg (ref 26.0–34.0)
MCH: 27.5 pg (ref 26.0–34.0)
MCHC: 31.6 g/dL (ref 30.0–36.0)
MCHC: 31.3 g/dL (ref 30.0–36.0)
MCV: 88.3 fL (ref 78.0–100.0)
MCV: 87.9 fL (ref 78.0–100.0)
MONOS PCT: 5 % (ref 3–12)
Monocytes Absolute: 0.7 10*3/uL (ref 0.1–1.0)
Monocytes Absolute: 0.3 10*3/uL (ref 0.1–1.0)
Monocytes Relative: 3 % (ref 3–12)
NEUTROS ABS: 11.4 10*3/uL — AB (ref 1.7–7.7)
NEUTROS PCT: 85 % — AB (ref 43–77)
NEUTROS PCT: 85 % — AB (ref 43–77)
Neutro Abs: 8.7 10*3/uL — ABNORMAL HIGH (ref 1.7–7.7)
PLATELETS: 266 10*3/uL (ref 150–400)
Platelets: 293 10*3/uL (ref 150–400)
RBC: 3.51 MIL/uL — ABNORMAL LOW (ref 3.87–5.11)
RBC: 3.13 MIL/uL — AB (ref 3.87–5.11)
RDW: 14.4 % (ref 11.5–15.5)
RDW: 14.5 % (ref 11.5–15.5)
WBC: 13.4 10*3/uL — AB (ref 4.0–10.5)
WBC: 10.2 10*3/uL (ref 4.0–10.5)

## 2013-09-23 LAB — PHOSPHORUS: PHOSPHORUS: 4 mg/dL (ref 2.3–4.6)

## 2013-09-23 LAB — MRSA PCR SCREENING: MRSA by PCR: NEGATIVE

## 2013-09-23 LAB — PRO B NATRIURETIC PEPTIDE
Pro B Natriuretic peptide (BNP): 2533 pg/mL — ABNORMAL HIGH (ref 0–450)
Pro B Natriuretic peptide (BNP): 2961 pg/mL — ABNORMAL HIGH (ref 0–450)

## 2013-09-23 LAB — URINE MICROSCOPIC-ADD ON

## 2013-09-23 LAB — MAGNESIUM: Magnesium: 2.1 mg/dL (ref 1.5–2.5)

## 2013-09-23 LAB — APTT: APTT: 39 s — AB (ref 24–37)

## 2013-09-23 LAB — I-STAT TROPONIN, ED: TROPONIN I, POC: 0.03 ng/mL (ref 0.00–0.08)

## 2013-09-23 MED ORDER — INSULIN REGULAR HUMAN 100 UNIT/ML IJ SOLN
INTRAMUSCULAR | Status: DC
  Administered 2013-09-23: 2.2 [IU]/h via INTRAVENOUS
  Filled 2013-09-23: qty 1

## 2013-09-23 MED ORDER — IPRATROPIUM-ALBUTEROL 0.5-2.5 (3) MG/3ML IN SOLN
3.0000 mL | Freq: Once | RESPIRATORY_TRACT | Status: AC
  Administered 2013-09-23: 3 mL via RESPIRATORY_TRACT
  Filled 2013-09-23: qty 3

## 2013-09-23 MED ORDER — IPRATROPIUM-ALBUTEROL 0.5-2.5 (3) MG/3ML IN SOLN
3.0000 mL | RESPIRATORY_TRACT | Status: DC
  Administered 2013-09-23 – 2013-09-26 (×15): 3 mL via RESPIRATORY_TRACT
  Filled 2013-09-23 (×15): qty 3

## 2013-09-23 MED ORDER — CARVEDILOL 12.5 MG PO TABS
12.5000 mg | ORAL_TABLET | Freq: Two times a day (BID) | ORAL | Status: DC
Start: 2013-09-23 — End: 2013-09-29
  Administered 2013-09-23 – 2013-09-29 (×12): 12.5 mg via ORAL
  Filled 2013-09-23 (×17): qty 1

## 2013-09-23 MED ORDER — NITROGLYCERIN IN D5W 200-5 MCG/ML-% IV SOLN: 5.0000 ug/min | INTRAVENOUS | Status: DC

## 2013-09-23 MED ORDER — PANTOPRAZOLE SODIUM 40 MG IV SOLR
40.0000 mg | INTRAVENOUS | Status: DC
  Administered 2013-09-23: 40 mg via INTRAVENOUS
  Filled 2013-09-23 (×2): qty 40

## 2013-09-23 MED ORDER — FUROSEMIDE 10 MG/ML IJ SOLN
40.0000 mg | Freq: Once | INTRAMUSCULAR | Status: AC
  Administered 2013-09-23: 40 mg via INTRAVENOUS
  Filled 2013-09-23: qty 4

## 2013-09-23 MED ORDER — WARFARIN SODIUM 5 MG PO TABS
5.0000 mg | ORAL_TABLET | Freq: Once | ORAL | Status: AC
  Administered 2013-09-23: 5 mg via ORAL
  Filled 2013-09-23: qty 1

## 2013-09-23 MED ORDER — NITROGLYCERIN IN D5W 200-5 MCG/ML-% IV SOLN
INTRAVENOUS | Status: AC
  Filled 2013-09-23: qty 250

## 2013-09-23 MED ORDER — IOHEXOL 350 MG/ML SOLN
80.0000 mL | Freq: Once | INTRAVENOUS | Status: AC | PRN
Start: 2013-09-23 — End: 2013-09-23
  Administered 2013-09-23: 80 mL via INTRAVENOUS

## 2013-09-23 MED ORDER — WARFARIN - PHARMACIST DOSING INPATIENT
Freq: Every day | Status: DC
  Administered 2013-09-25 – 2013-09-28 (×2)

## 2013-09-23 MED ORDER — FUROSEMIDE 10 MG/ML IJ SOLN
40.0000 mg | Freq: Four times a day (QID) | INTRAMUSCULAR | Status: AC
Start: 2013-09-23 — End: 2013-09-24
  Administered 2013-09-23 – 2013-09-24 (×2): 40 mg via INTRAVENOUS
  Filled 2013-09-23: qty 4

## 2013-09-23 MED ORDER — INSULIN ASPART 100 UNIT/ML ~~LOC~~ SOLN: 2.0000 [IU] | SUBCUTANEOUS | Status: DC

## 2013-09-23 MED ORDER — DEXTROSE-NACL 5-0.45 % IV SOLN
INTRAVENOUS | Status: DC
  Administered 2013-09-24: via INTRAVENOUS

## 2013-09-23 MED ORDER — ASPIRIN 81 MG PO CHEW
324.0000 mg | CHEWABLE_TABLET | Freq: Once | ORAL | Status: AC
  Administered 2013-09-23: 324 mg via ORAL
  Filled 2013-09-23: qty 4

## 2013-09-23 MED ORDER — HEPARIN (PORCINE) IN NACL 100-0.45 UNIT/ML-% IJ SOLN
1250.0000 [IU]/h | INTRAMUSCULAR | Status: DC
  Administered 2013-09-24 (×2): 1250 [IU]/h via INTRAVENOUS
  Filled 2013-09-23 (×2): qty 250

## 2013-09-23 MED ORDER — NITROGLYCERIN 2 % TD OINT: 1.0000 [in_us] | TOPICAL_OINTMENT | Freq: Once | TRANSDERMAL | Status: DC

## 2013-09-23 MED ORDER — ASPIRIN 81 MG PO CHEW: 81.0000 mg | CHEWABLE_TABLET | Freq: Once | ORAL | Status: AC

## 2013-09-23 NOTE — ED Notes (Signed)
Pt was coming home from doctor, became sob in driveway and called 768.  Medic truck arrived and stated pt had "silent chest" with sats of 70%RA.  5 mg albuterol given through C-pap machine, initially improved sats to 90%.  Pt now 100% on c-pap.  Pt given 3 nitro and stated improved sob after 2nd nitro.  PT states she has a blood clot in her lungs.  V-paced rhythm.  CBG 220.

## 2013-09-23 NOTE — Consult Note (Deleted)
PULMONARY / CRITICAL CARE MEDICINE   Name: Andres Escandon MRN: [id number] DOB: [birth year]    ADMISSION DATE:  09/23/2013 CONSULTATION DATE:  09/23/2013  REFERRING MD :  EDP PRIMARY SERVICE: PCCM  CHIEF COMPLAINT:  Hypoxia  BRIEF PATIENT DESCRIPTION: 80 year old female with PMH of HTN and DM. Recent discharged from [facility] for PE. Presents with SOB and hypoxia 3/20. PCCM asked to admit.  SIGNIFICANT EVENTS / STUDIES:  3/20 admitted to [facility] ICU 3/20 2D echo >>> 3/20 Doppler BLE >>>  LINES / TUBES: PIV  CULTURES: Blood 3/20 >>> Urine 3/20 >>>  ANTIBIOTICS: None  HISTORY OF PRESENT ILLNESS:  80 year old female with PMH as below in addition to CHF. She was recently discharged from [place] about a week ago for PE. Was at a follow up appointment today, and found to be hypoxic with SpO2 70% on room air. She was placed on CPAP and O2 sat normalized. She was brought to [facility] ED noted to be in moderate-severe respiratory distress. PCCM asked to admit.   PAST MEDICAL HISTORY :  Past Medical History  Diagnosis Date  . Hypertension   . Diabetes mellitus without complication   . PE (pulmonary embolism)   . Pancreatitis    Past Surgical History  Procedure Laterality Date  . Valve replacement    . Pacemaker placement     Prior to Admission medications   Medication Sig Start Date End Date Taking? Authorizing Provider  aspirin 81 MG tablet Take 81 mg by mouth daily.    Historical Provider, MD  carvedilol (COREG) 25 MG tablet Take 25 mg by mouth 2 (two) times daily with a meal.    Historical Provider, MD  furosemide (LASIX) 40 MG tablet Take 40 mg by mouth.    Historical Provider, MD  glimepiride (AMARYL) 4 MG tablet Take 4 mg by mouth 2 (two) times daily.    Historical Provider, MD  insulin aspart protamine- aspart (NOVOLOG MIX 70/30) (70-30) 100 UNIT/ML injection Inject into the skin. 32 units in morning and 30 units in the evening    Historical Provider, MD  lisinopril (PRINIVIL,ZESTRIL)  20 MG tablet Take 20 mg by mouth daily.    Historical Provider, MD   No Known Allergies  FAMILY HISTORY:  No family history on file. SOCIAL HISTORY:  reports that she has never smoked. She has never used smokeless tobacco. She reports that she does not drink alcohol or use illicit drugs.  REVIEW OF SYSTEMS:    Bolds are positive  Constitutional: weight loss, gain, night sweats, Fevers, chills, fatigue .  HEENT: headaches, Sore throat, sneezing, nasal congestion, post nasal drip, Difficulty swallowing, Tooth/dental problems, visual complaints visual changes, ear ache CV:  chest pain, radiates: ,Orthopnea, PND, swelling in lower extremities, dizziness, palpitations, syncope.  GI  heartburn, indigestion, abdominal pain, nausea, vomiting, diarrhea, change in bowel habits, loss of appetite, bloody stools.  Resp: cough, productive: , hemoptysis, dyspnea, chest pain, pleuritic.  Skin: rash or itching or icterus GU: dysuria, change in color of urine, urgency or frequency. flank pain, hematuria  MS: joint pain or swelling. decreased range of motion  Psych: change in mood or affect. depression or anxiety.  Neuro: difficulty with speech, weakness, numbness, ataxia    SUBJECTIVE:   VITAL SIGNS: Temp:  [97.5 F (36.4 C)] 97.5 F (36.4 C) (03/20 1416) Pulse Rate:  [70-80] 71 (03/20 1442) Resp:  [24-26] 24 (03/20 1442) BP: (168)/(77) 168/77 mmHg (03/20 1354) SpO2:  [98 %-100 %] 98 % (  03/20 1442) FiO2 (%):  [75 %] 75 % (03/20 1415) HEMODYNAMICS:   VENTILATOR SETTINGS: Vent Mode:  [-]  FiO2 (%):  [75 %] 75 % INTAKE / OUTPUT: Intake/Output   None     PHYSICAL EXAMINATION: General:  Obese female on BiPAP Neuro:  Alert, oriented HEENT:  [place]/AT, no JVD noted.  Cardiovascular:  RRR Lungs:  Diminished, Bibasilar crackles Abdomen:  Soft, obese, non-tender Musculoskeletal:  No acute deformity Skin:  Intact  LABS:  CBC  Recent Labs Lab 09/23/13 1404 09/23/13 1426  WBC 13.4*  --    HGB 9.8* 10.9*  HCT 31.0* 32.0*  PLT 293  --    Coag's  Recent Labs Lab 09/23/13 1404  INR 1.86*   BMET  Recent Labs Lab 09/23/13 1404 09/23/13 1426  NA 136* 136*  K 4.9 4.7  CL 93* 93*  CO2 31  --   BUN 16 19  CREATININE 0.85 1.00  GLUCOSE 282* 283*   Electrolytes  Recent Labs Lab 09/23/13 1404  CALCIUM 9.1   Sepsis Markers  Recent Labs Lab 09/23/13 1415  LATICACIDVEN 1.09   ABG  Recent Labs Lab 09/23/13 1434  PHART 7.291*  PCO2ART 76.9*  PO2ART 270.0*   Liver Enzymes  Recent Labs Lab 09/23/13 1404  AST 34  ALT 20  ALKPHOS 92  BILITOT 0.5  ALBUMIN 3.2*   Cardiac Enzymes  Recent Labs Lab 09/23/13 1404  PROBNP 2533.0*   Glucose No results found for this basename: GLUCAP,  in the last 168 hours  Imaging Dg Chest Portable 1 View  09/23/2013   CLINICAL DATA:  Shortness of breath.  EXAM: PORTABLE CHEST - 1 VIEW  COMPARISON:  January 30, 2008.  FINDINGS: Sternotomy wires are noted. Stable cardiomegaly. Interval placement of left-sided pacemaker with leads in grossly good position. Mild central pulmonary vascular congestion is with probable minimal bilateral perihilar and basilar edema. No pneumothorax is noted. Minimal bilateral pleural effusions are noted.  IMPRESSION: Mild central pulmonary vascular congestion is now noted with probable minimal to mild bilateral perihilar and basilar edema with associated minimal pleural effusions.   Electronically Signed   By: Sabino Dick M.D.   On: 09/23/2013 14:24    CXR: Bilateral pulmonary edema, pulmonary vasculature congestion.   ASSESSMENT / PLAN:  PULMONARY A: Acute Respiratory Failure  Secondary to pulmonary edema, likely acute on chronic CHF Pulmonary Edema   P:   Continuous BiPAP to keep SpO2 > 92% Lasix 40mg  q6 hours x3 Nitroglycerin gtt Follow CXR Follow ABG Scheduled Duoneb  CARDIOVASCULAR A:  Acute on Chronic CHF  P:  Diurese as tolerated 2D echo Check BNP Cont coreg Hold  lisinopril Daily weights/ Strict I&O  RENAL A:   Hypervolemia  P:   Diurese as tolerated Monitor BMP  GASTROINTESTINAL A:   No Acute Issues  P:   Diet NPO PPI  HEMATOLOGIC A:   Pulmonary Embolism reported recent admission to [place]. On Coumadin but not therapeutic.   P:  Doppler BLE Coumadin per pharmacy Heparin gtt to bridge  INFECTIOUS A:   Leukocytosis  P:   Will monitor off ABX Pan Culture Follow WBC and fever curve Check PCT  ENDOCRINE A:   DM  P:   SSI Hold Amaryl  NEUROLOGIC A:   No acute issue  P:   Supportive Care  TODAY'S SUMMARY: Will admit and monitor on BiPAP in ICU. Plan is to anticoagulate for PE, assess extent of CHF, and assess after diuresis.   I have  personally obtained a history, examined the patient, evaluated laboratory and imaging results, formulated the assessment and plan and placed orders. CRITICAL CARE: The patient is critically ill with multiple organ systems failure and requires high complexity decision making for assessment and support, frequent evaluation and titration of therapies, application of advanced monitoring technologies and extensive interpretation of multiple databases. Critical Care Time devoted to patient care services described in this note is 40 minutes.   [facility]/Critical Care Pager [phone] or [phone]  09/23/2013, 3:33 PM

## 2013-09-23 NOTE — Progress Notes (Signed)
ANTICOAGULATION CONSULT NOTE - Follow Up Consult  Pharmacy Consult for Coumadin Indication: pulmonary embolus  No Known Allergies  Labs:  Recent Labs  09/23/13 1404 09/23/13 1426 09/23/13 1856  HGB 9.8* 10.9* 8.6*  HCT 31.0* 32.0* 27.5*  PLT 293  --  266  APTT  --   --  39*  LABPROT 20.9*  --  21.3*  INR 1.86*  --  1.91*  CREATININE 0.85 1.00 0.85   Assessment:   Missed daughter on the floor earlier tonight, but patient reported home Coumadin dose of 4 mg daily. Last dose taken at home on 3/19.  She thought her INR was just checked on Wednesday, and was 2.8, but INR tonight is 1.86 and 1.91. Home Coumadin regimen later confirmed with daughter, Noe Gens.   Goal of Therapy:  INR 2-3 Heparin level 0.3-0.7 units/ml Monitor platelets by anticoagulation protocol: Yes   Plan:   Coumadin 5 mg tonight.  Given.  Continue heparin drip at 1100 units/hr  Heparin level due at 11:30pm.  Daily heparin level, CBC and PT/INR.  Arty Baumgartner, [place] Pager: [phone] 09/23/2013,9:10 PM

## 2013-09-23 NOTE — Progress Notes (Addendum)
*  Preliminary Results* Bilateral lower extremity venous duplex completed. Study was technically limited due to patient body habitus, patient position, and respiratory interference. There is no obvious evidence of deep vein thrombosis involving visualized veins of bilateral lower extremities.   09/23/2013  Maudry Mayhew, RVT, RDCS, RDMS

## 2013-09-23 NOTE — ED Provider Notes (Signed)
CSN: [id number]     Arrival date & time 09/23/13  1346 History   First MD Initiated Contact with Patient 09/23/13 1356     Chief Complaint  Patient presents with  . Shortness of Breath     (Consider location/radiation/quality/duration/timing/severity/associated sxs/prior Treatment) HPI Comments: Patient arrives by EMS with acute onset of shortness of breath and hypoxia. She reportedly had just returned home from visiting a Dr. She was found to be hypoxic at 70% on room air and placed on CPAP and albuterol. Sats improved to the high 90s. She has a history of hypertension, diabetes and what sounds like pulmonary embolism. She does take Coumadin. She states she was discharged from [place] one week ago. Denies any chest pain. Denies abdominal pain, nausea or vomiting. She states she does have a history of heart failure.  The history is provided by the patient and the EMS personnel. The history is limited by the condition of the patient.    Past Medical History  Diagnosis Date  . Hypertension   . Diabetes mellitus without complication   . PE (pulmonary embolism)   . Pancreatitis    Past Surgical History  Procedure Laterality Date  . Valve replacement    . Pacemaker placement     No family history on file. History  Substance Use Topics  . Smoking status: Never Smoker   . Smokeless tobacco: Never Used  . Alcohol Use: No   OB History   Grav Para Term Preterm Abortions TAB SAB Ect Mult Living                 Review of Systems  Unable to perform ROS: Acuity of condition  Respiratory: Positive for shortness of breath.       Allergies  Review of patient's allergies indicates no known allergies.  Home Medications   Current Outpatient Rx  Name  Route  Sig  Dispense  Refill  . aspirin 81 MG tablet   Oral   Take 81 mg by mouth daily.         . carvedilol (COREG) 25 MG tablet   Oral   Take 25 mg by mouth 2 (two) times daily with a meal.         . furosemide (LASIX) 40  MG tablet   Oral   Take 40 mg by mouth.         Marland Kitchen glimepiride (AMARYL) 4 MG tablet   Oral   Take 4 mg by mouth 2 (two) times daily.         . insulin aspart protamine- aspart (NOVOLOG MIX 70/30) (70-30) 100 UNIT/ML injection   Subcutaneous   Inject into the skin. 32 units in morning and 30 units in the evening         . lisinopril (PRINIVIL,ZESTRIL) 20 MG tablet   Oral   Take 20 mg by mouth daily.          BP 168/77  Pulse 70  Temp(Src) 97.5 F (36.4 C) (Axillary)  Resp 24  SpO2 100% Physical Exam  Constitutional: She is oriented to person, place, and time. She appears well-developed and well-nourished. She appears distressed.  tachypneic with increased work of breathing  HENT:  Head: Normocephalic and atraumatic.  Mouth/Throat: Oropharynx is clear and moist. No oropharyngeal exudate.  Eyes: Conjunctivae and EOM are normal. Pupils are equal, round, and reactive to light.  Neck: Normal range of motion. Neck supple.  Cardiovascular: Normal rate, regular rhythm and normal heart sounds.  No murmur heard. Pulmonary/Chest: She is in respiratory distress. She has rales.  Bibasilar rales Decreased breath sounds throughout  Abdominal: Soft. There is no tenderness. There is no rebound and no guarding.  Musculoskeletal: Normal range of motion. She exhibits no edema and no tenderness.  Neurological: She is alert and oriented to person, place, and time. No cranial nerve deficit. She exhibits normal muscle tone. Coordination normal.  Skin: Skin is warm.    ED Course  Procedures (including critical care time) Labs Review Labs Reviewed  CBC WITH DIFFERENTIAL - Abnormal; Notable for the following:    WBC 13.4 (*)    RBC 3.51 (*)    Hemoglobin 9.8 (*)    HCT 31.0 (*)    Neutrophils Relative % 85 (*)    Neutro Abs 11.4 (*)    Lymphocytes Relative 9 (*)    All other components within normal limits  PROTIME-INR - Abnormal; Notable for the following:    Prothrombin Time  20.9 (*)    INR 1.86 (*)    All other components within normal limits  I-STAT CHEM 8, ED - Abnormal; Notable for the following:    Sodium 136 (*)    Chloride 93 (*)    Glucose, Bld 283 (*)    Hemoglobin 10.9 (*)    HCT 32.0 (*)    All other components within normal limits  COMPREHENSIVE METABOLIC PANEL  PRO B NATRIURETIC PEPTIDE  URINALYSIS, ROUTINE W REFLEX MICROSCOPIC  I-STAT CG4 LACTIC ACID, ED  I-STAT TROPOININ, ED  I-STAT ARTERIAL BLOOD GAS, ED   Imaging Review Dg Chest Portable 1 View  09/23/2013   CLINICAL DATA:  Shortness of breath.  EXAM: PORTABLE CHEST - 1 VIEW  COMPARISON:  January 30, 2008.  FINDINGS: Sternotomy wires are noted. Stable cardiomegaly. Interval placement of left-sided pacemaker with leads in grossly good position. Mild central pulmonary vascular congestion is with probable minimal bilateral perihilar and basilar edema. No pneumothorax is noted. Minimal bilateral pleural effusions are noted.  IMPRESSION: Mild central pulmonary vascular congestion is now noted with probable minimal to mild bilateral perihilar and basilar edema with associated minimal pleural effusions.   Electronically Signed   By: Sabino Dick M.D.   On: 09/23/2013 14:24     EKG Interpretation None      MDM   Final diagnoses:  Acute respiratory failure with hypoxia and hypercarbia  CHF (congestive heart failure)  Pulmonary embolism   Respiratory distress with hypoxia.  Recent admission to OSH for PE.  Decreased breath sounds on exam without wheezing.  Denies COPD, CAD hx. No chest pain.   Patient continued on NRB and then Bipap in ED.  ABG with CO2 retention and acidosis. CXR with vascular congestion and edema.  ASA, nebs, steroids, lasix. Start nitro gtt, heparin gtt.  INR 1.8 Multifactorial respiratory failure, likely 2/2 CHF, PE, obesity hypoventilation syndrome.  Breathing has improved on bipap and patient is comfortable.  Wean FiO2.  D/w PCCM who will admit.  CRITICAL  CARE Performed by: Ezequiel Essex Total critical care time: 30 Critical care time was exclusive of separately billable procedures and treating other patients. Critical care was necessary to treat or prevent imminent or life-threatening deterioration. Critical care was time spent personally by me on the following activities: development of treatment plan with patient and/or surrogate as well as nursing, discussions with consultants, evaluation of patient's response to treatment, examination of patient, obtaining history from patient or surrogate, ordering and performing treatments and interventions, ordering and review  of laboratory studies, ordering and review of radiographic studies, pulse oximetry and re-evaluation of patient's condition.   Ezequiel Essex, MD 09/23/13 [phone]

## 2013-09-23 NOTE — ED Notes (Signed)
Lactic acid results given to Dr. Pickering 

## 2013-09-23 NOTE — ED Notes (Signed)
Critical care at bedside  

## 2013-09-23 NOTE — Progress Notes (Signed)
Changes made per MD order per ABG results. RT will monitor.

## 2013-09-23 NOTE — ED Notes (Signed)
Xray called

## 2013-09-23 NOTE — ED Notes (Signed)
Report called to [employer] on 2100.

## 2013-09-23 NOTE — Progress Notes (Signed)
ANTICOAGULATION CONSULT NOTE - Initial Consult  Pharmacy Consult for heparin Indication: pulmonary embolus  No Known Allergies  Patient Measurements:   Heparin Dosing Weight: 74.1kg  Vital Signs: Temp: 97.5 F (36.4 C) (03/20 1416) Temp src: Axillary (03/20 1416) BP: 168/77 mmHg (03/20 1354) Pulse Rate: 71 (03/20 1442)  Labs:  Recent Labs  09/23/13 1404 09/23/13 1426  HGB 9.8* 10.9*  HCT 31.0* 32.0*  PLT 293  --   LABPROT 20.9*  --   INR 1.86*  --   CREATININE 0.85 1.00    The CrCl is unknown because both a height and weight (above a minimum accepted value) are required for this calculation.   Medical History: Past Medical History  Diagnosis Date  . Hypertension   . Diabetes mellitus without complication   . PE (pulmonary embolism)   . Pancreatitis     Medications:  Infusions:  . heparin    . nitroGLYCERIN      Assessment: 41 yof presented to the ED with SOB. She is on chronic coumadin for treatment of PE. However, INR is subtherapeutic at 1.86. H/H is low but plts are WNL. No overt bleeding noted. Pt stated she has not taken her coumadin dose today.   Goal of Therapy:  Heparin level 0.3-0.7 units/ml Monitor platelets by anticoagulation protocol: Yes   Plan:  1. Heparin gtt 1100 units/hr 2. Check an 8 hour heparin level 3. Daily heparin level and CBC 4. F/u restart coumadin  Adarius Tigges, Rande Lawman 09/23/2013,2:55 PM

## 2013-09-24 ENCOUNTER — Inpatient Hospital Stay (HOSPITAL_COMMUNITY): Payer: Medicare Other

## 2013-09-24 DIAGNOSIS — J9600 Acute respiratory failure, unspecified whether with hypoxia or hypercapnia: Secondary | ICD-10-CM | POA: Diagnosis not present

## 2013-09-24 DIAGNOSIS — J811 Chronic pulmonary edema: Secondary | ICD-10-CM | POA: Diagnosis not present

## 2013-09-24 LAB — URINE CULTURE
COLONY COUNT: NO GROWTH
Culture: NO GROWTH

## 2013-09-24 LAB — BASIC METABOLIC PANEL
BUN: 18 mg/dL (ref 6–23)
CO2: 34 meq/L — AB (ref 19–32)
CREATININE: 0.97 mg/dL (ref 0.50–1.10)
Calcium: 8.9 mg/dL (ref 8.4–10.5)
Chloride: 95 mEq/L — ABNORMAL LOW (ref 96–112)
GFR calc Af Amer: 62 mL/min — ABNORMAL LOW (ref >90)
GFR, EST NON AFRICAN AMERICAN: 53 mL/min — AB (ref >90)
GLUCOSE: 91 mg/dL (ref 70–99)
Potassium: 4 mEq/L (ref 3.7–5.3)
Sodium: 139 mEq/L (ref 137–147)

## 2013-09-24 LAB — CBC
HEMATOCRIT: 27.5 % — AB (ref 36.0–46.0)
Hemoglobin: 8.6 g/dL — ABNORMAL LOW (ref 12.0–15.0)
MCH: 27.7 pg (ref 26.0–34.0)
MCHC: 31.3 g/dL (ref 30.0–36.0)
MCV: 88.4 fL (ref 78.0–100.0)
Platelets: 260 10*3/uL (ref 150–400)
RBC: 3.11 MIL/uL — AB (ref 3.87–5.11)
RDW: 14.4 % (ref 11.5–15.5)
WBC: 9.4 10*3/uL (ref 4.0–10.5)

## 2013-09-24 LAB — MAGNESIUM: Magnesium: 2 mg/dL (ref 1.5–2.5)

## 2013-09-24 LAB — PROTIME-INR
INR: 2.23 — ABNORMAL HIGH (ref 0.00–1.49)
Prothrombin Time: 24 seconds — ABNORMAL HIGH (ref 11.6–15.2)

## 2013-09-24 LAB — GLUCOSE, CAPILLARY
GLUCOSE-CAPILLARY: 142 mg/dL — AB (ref 70–99)
GLUCOSE-CAPILLARY: 132 mg/dL — AB (ref 70–99)
GLUCOSE-CAPILLARY: 250 mg/dL — AB (ref 70–99)
GLUCOSE-CAPILLARY: 251 mg/dL — AB (ref 70–99)
Glucose-Capillary: 102 mg/dL — ABNORMAL HIGH (ref 70–99)
Glucose-Capillary: 48 mg/dL — ABNORMAL LOW (ref 70–99)

## 2013-09-24 LAB — HEPARIN LEVEL (UNFRACTIONATED)
HEPARIN UNFRACTIONATED: 0.3 [IU]/mL (ref 0.30–0.70)
HEPARIN UNFRACTIONATED: 0.15 [IU]/mL — AB (ref 0.30–0.70)

## 2013-09-24 LAB — PHOSPHORUS: Phosphorus: 4.1 mg/dL (ref 2.3–4.6)

## 2013-09-24 MED ORDER — HYDRALAZINE HCL 25 MG PO TABS
25.0000 mg | ORAL_TABLET | Freq: Three times a day (TID) | ORAL | Status: DC
  Administered 2013-09-24 – 2013-09-29 (×15): 25 mg via ORAL
  Filled 2013-09-24 (×17): qty 1

## 2013-09-24 MED ORDER — PANTOPRAZOLE SODIUM 40 MG PO TBEC
40.0000 mg | DELAYED_RELEASE_TABLET | Freq: Every day | ORAL | Status: DC
  Administered 2013-09-24 – 2013-09-28 (×5): 40 mg via ORAL
  Filled 2013-09-24 (×5): qty 1

## 2013-09-24 MED ORDER — WARFARIN SODIUM 5 MG PO TABS
5.0000 mg | ORAL_TABLET | Freq: Once | ORAL | Status: AC
  Administered 2013-09-24: 5 mg via ORAL
  Filled 2013-09-24: qty 1

## 2013-09-24 MED ORDER — ASPIRIN 81 MG PO CHEW
81.0000 mg | CHEWABLE_TABLET | Freq: Every day | ORAL | Status: DC
Start: 2013-09-24 — End: 2013-09-29
  Administered 2013-09-24 – 2013-09-29 (×6): 81 mg via ORAL
  Filled 2013-09-24 (×6): qty 1

## 2013-09-24 MED ORDER — DEXTROSE 50 % IV SOLN
INTRAVENOUS | Status: AC
  Administered 2013-09-24: 50 mL
  Filled 2013-09-24: qty 50

## 2013-09-24 MED ORDER — FUROSEMIDE 10 MG/ML IJ SOLN
40.0000 mg | Freq: Three times a day (TID) | INTRAMUSCULAR | Status: AC
  Administered 2013-09-24 – 2013-09-25 (×2): 40 mg via INTRAVENOUS
  Filled 2013-09-24: qty 4

## 2013-09-24 MED ORDER — INSULIN ASPART 100 UNIT/ML ~~LOC~~ SOLN
0.0000 [IU] | Freq: Three times a day (TID) | SUBCUTANEOUS | Status: DC
  Administered 2013-09-24: 5 [IU] via SUBCUTANEOUS
  Administered 2013-09-25: 3 [IU] via SUBCUTANEOUS

## 2013-09-24 MED ORDER — INSULIN ASPART 100 UNIT/ML ~~LOC~~ SOLN
0.0000 [IU] | SUBCUTANEOUS | Status: DC
  Administered 2013-09-24: 3 [IU] via SUBCUTANEOUS
  Administered 2013-09-24: 2 [IU] via SUBCUTANEOUS
  Administered 2013-09-24: 5 [IU] via SUBCUTANEOUS

## 2013-09-24 NOTE — Progress Notes (Signed)
ANTICOAGULATION CONSULT NOTE - Follow Up Consult  Pharmacy Consult for Coumadin Indication: pulmonary embolus  No Known Allergies  Labs:  Recent Labs  09/23/13 1404 09/23/13 1426 09/23/13 1856 09/23/13 2326 09/24/13 0230 09/24/13 0950  HGB 9.8* 10.9* 8.6*  --  8.6*  --   HCT 31.0* 32.0* 27.5*  --  27.5*  --   PLT 293  --  266  --  260  --   APTT  --   --  39*  --   --   --   LABPROT 20.9*  --  21.3*  --  24.0*  --   INR 1.86*  --  1.91*  --  2.23*  --   HEPARINUNFRC  --   --   --  0.30  --  0.15*  CREATININE 0.85 1.00 0.85  --  0.97  --    Assessment: 80 yo F on Coumadin PTA for recent PE diagnosed at [facility].  INR on admission was subtherapeutic and pt was started on heparin.  INR is now therapeutic.  CTA neg PE.  Will discontinue heparin.    Home Coumadin dose of 4 mg daily. Home Coumadin regimen later confirmed with daughter, Noe Gens.   Goal of Therapy:  INR 2-3 Monitor platelets by anticoagulation protocol: Yes   Plan:  Repeat Coumadin 5 mg tonight.   Follow INR trend.  May need to decrease to home 4mg  daily dose. Daily INR D/C heparin and associated labs.  [employer], Pharm.D., BCPS Clinical Pharmacist Pager [phone] 09/24/2013 12:58 PM

## 2013-09-24 NOTE — Consult Note (Addendum)
PULMONARY / CRITICAL CARE MEDICINE   Name: Kimberly Peterson MRN: [id number] DOB: 25-May-1930    ADMISSION DATE:  09/23/2013 CONSULTATION DATE:  09/23/2013  REFERRING MD :  EDP PRIMARY SERVICE: PCCM  CHIEF COMPLAINT:  Hypoxia  BRIEF PATIENT DESCRIPTION: 80 year old female with PMH of HTN and DM. Recent discharged from [facility] for PE. Presents with SOB and hypoxia 3/20. PCCM asked to admit.  SIGNIFICANT EVENTS / STUDIES:  3/20 admitted to [facility] ICU 3/20 2D echo >>> 3/20 Doppler BLE >>>  LINES / TUBES: PIV  CULTURES: Blood 3/20 >>> Urine 3/20 >>>  ANTIBIOTICS: None  HISTORY OF PRESENT ILLNESS:  80 year old female with PMH as below in addition to CHF. She was recently discharged from [facility] about a week ago for PE. Was at a follow up appointment today, and found to be hypoxic with SpO2 70% on room air. She was placed on CPAP and O2 sat normalized. She was brought to [facility] ED noted to be in moderate-severe respiratory distress. PCCM asked to admit.   PAST MEDICAL HISTORY :  Past Medical History  Diagnosis Date  . Hypertension   . Diabetes mellitus without complication   . PE (pulmonary embolism)   . Pancreatitis   . CHF (congestive heart failure)   . Cholelithiasis    Past Surgical History  Procedure Laterality Date  . Valve replacement    . Pacemaker placement     Prior to Admission medications   Medication Sig Start Date End Date Taking? Authorizing Provider  aspirin 81 MG tablet Take 81 mg by mouth daily.    Historical Provider, MD  carvedilol (COREG) 25 MG tablet Take 25 mg by mouth 2 (two) times daily with a meal.    Historical Provider, MD  furosemide (LASIX) 40 MG tablet Take 40 mg by mouth.    Historical Provider, MD  glimepiride (AMARYL) 4 MG tablet Take 4 mg by mouth 2 (two) times daily.    Historical Provider, MD  insulin aspart protamine- aspart (NOVOLOG MIX 70/30) (70-30) 100 UNIT/ML injection Inject into the skin. 32 units in morning and 30 units in the evening     Historical Provider, MD  lisinopril (PRINIVIL,ZESTRIL) 20 MG tablet Take 20 mg by mouth daily.    Historical Provider, MD   No Known Allergies  FAMILY HISTORY:  No family history on file. SOCIAL HISTORY:  reports that she has never smoked. She has never used smokeless tobacco. She reports that she does not drink alcohol or use illicit drugs.  REVIEW OF SYSTEMS:    Bolds are positive  Constitutional: weight loss, gain, night sweats, Fevers, chills, fatigue .  HEENT: headaches, Sore throat, sneezing, nasal congestion, post nasal drip, Difficulty swallowing, Tooth/dental problems, visual complaints visual changes, ear ache CV:  chest pain, radiates: ,Orthopnea, PND, swelling in lower extremities, dizziness, palpitations, syncope.  GI  heartburn, indigestion, abdominal pain, nausea, vomiting, diarrhea, change in bowel habits, loss of appetite, bloody stools.  Resp: cough, productive: , hemoptysis, dyspnea, chest pain, pleuritic.  Skin: rash or itching or icterus GU: dysuria, change in color of urine, urgency or frequency. flank pain, hematuria  MS: joint pain or swelling. decreased range of motion  Psych: change in mood or affect. depression or anxiety.  Neuro: difficulty with speech, weakness, numbness, ataxia    SUBJECTIVE:   VITAL SIGNS: Temp:  [97.5 F (36.4 C)-98.4 F (36.9 C)] 98.4 F (36.9 C) (03/21 0814) Pulse Rate:  [69-83] 78 (03/21 0900) Resp:  [14-26] 20 (03/21  0900) BP: (97-177)/(44-78) 152/55 mmHg (03/21 0900) SpO2:  [90 %-100 %] 97 % (03/21 0940) FiO2 (%):  [75 %] 75 % (03/20 1415) Weight:  [228 lb 6.3 oz (103.6 kg)] 228 lb 6.3 oz (103.6 kg) (03/20 2200) HEMODYNAMICS:   VENTILATOR SETTINGS: Vent Mode:  [-]  FiO2 (%):  [75 %] 75 % INTAKE / OUTPUT: Intake/Output     03/20 0701 - 03/21 0700 03/21 0701 - 03/22 0700   I.V. (mL/kg) 469.9 (4.5) 125 (1.2)   Total Intake(mL/kg) 469.9 (4.5) 125 (1.2)   Urine (mL/kg/hr) 2500    Total Output 2500     Net -2030.1 +125           PHYSICAL EXAMINATION: General:  Obese female on BiPAP Neuro:  Alert, oriented HEENT:  [place]/AT, no JVD noted.  Cardiovascular:  RRR Lungs:  Diminished, Bibasilar crackles Abdomen:  Soft, obese, non-tender Musculoskeletal:  No acute deformity Skin:  Intact  LABS:  CBC  Recent Labs Lab 09/23/13 1404 09/23/13 1426 09/23/13 1856 09/24/13 0230  WBC 13.4*  --  10.2 9.4  HGB 9.8* 10.9* 8.6* 8.6*  HCT 31.0* 32.0* 27.5* 27.5*  PLT 293  --  266 260   Coag's  Recent Labs Lab 09/23/13 1404 09/23/13 1856 09/24/13 0230  APTT  --  39*  --   INR 1.86* 1.91* 2.23*   BMET  Recent Labs Lab 09/23/13 1404 09/23/13 1426 09/23/13 1856 09/24/13 0230  NA 136* 136* 136* 139  K 4.9 4.7 4.9 4.0  CL 93* 93* 92* 95*  CO2 31  --  32 34*  BUN 16 19 18 18   CREATININE 0.85 1.00 0.85 0.97  GLUCOSE 282* 283* 289* 91   Electrolytes  Recent Labs Lab 09/23/13 1404 09/23/13 1856 09/24/13 0230  CALCIUM 9.1 9.0 8.9  MG  --  2.1 2.0  PHOS  --  4.0 4.1   Sepsis Markers  Recent Labs Lab 09/23/13 1415  LATICACIDVEN 1.09   ABG  Recent Labs Lab 09/23/13 1434 09/23/13 1800  PHART 7.291* 7.380  PCO2ART 76.9* 59.1*  PO2ART 270.0* 73.0*   Liver Enzymes  Recent Labs Lab 09/23/13 1404 09/23/13 1856  AST 34 32  ALT 20 22  ALKPHOS 92 86  BILITOT 0.5 0.5  ALBUMIN 3.2* 3.0*   Cardiac Enzymes  Recent Labs Lab 09/23/13 1404 09/23/13 1856  PROBNP 2533.0* 2961.0*   Glucose  Recent Labs Lab 09/23/13 2225 09/23/13 2245 09/23/13 2351 09/24/13 0055 09/24/13 0114 09/24/13 0724  GLUCAP 171* 149* 74 48* 102* 142*    Imaging Ct Angio Chest Pe W/cm &/or Wo Cm  09/23/2013   CLINICAL DATA:  Shortness of breath, history of pulmonary embolism, hypertension, diabetes, evaluate for pulmonary embolism.  EXAM: CT ANGIOGRAPHY CHEST WITH CONTRAST  TECHNIQUE: Multidetector CT imaging of the chest was performed using the standard protocol during bolus administration of  intravenous contrast. Multiplanar CT image reconstructions and MIPs were obtained to evaluate the vascular anatomy.  CONTRAST:  81mL OMNIPAQUE IOHEXOL 350 MG/ML SOLN  COMPARISON:  DG CHEST 1V PORT dated 09/23/2013  FINDINGS: Vascular Findings:  There is adequate opacification of the pulmonary arterial system with the main pulmonary artery measuring 296 Hounsfield units. There are no discrete filling defects within the pulmonary arterial tree to the level of the bilateral subsegmental pulmonary arteries. Evaluation of distal subsegmental pulmonary arteries is degraded secondary to a combination of suboptimal vessel opacification and patient body habitus. Borderline enlarged caliber the main pulmonary artery measuring 32 mm in diameter.  Post median sternotomy. Coronary artery calcifications. Calcifications within the mitral valve annulus. Left anterior chest wall dual lead pacemaker with lead tips terminating within the right atrium and ventricle. No pericardial effusion. Scattered atherosclerotic plaque within a normal caliber thoracic aorta.  Review of the MIP images confirms the above findings.   ----------------------------------------------------------------------------------  Nonvascular Findings:  Examination is markedly degraded secondary to patient body habitus.  Small bilateral pleural effusions with associated dependent bibasilar opacities, right greater left. No definitive air bronchograms. No pneumothorax. There is mild interlobular septal thickening most conspicuous within the bilateral upper lungs. There is collapse of the right and left mainstem bronchi as well as multiple segmental bronchi. No pneumothorax.  Mediastinal and hilar lymphadenopathy with index precarinal lymph node measuring 1.3 cm in greatest short axis diameter (image 27, series 4, index AP window lymph node measuring 1.5 cm (image 27), index right suprahilar lymph node measuring 1.8 cm (image 33). No definite axillary lymphadenopathy.   There is diffuse decreased attenuation of the hepatic parenchyma suggestive of hepatic steatosis.  Stigmata of ankylosing spondylitis within the thoracic spine. Mildly accentuated thoracic kyphosis. Vertebral body heights appear preserved.  There is diffuse enlargement of the imaged thyroid gland which appears diffusely heterogeneous. The caudal aspect of the left lobe of the thyroid measuring at least 6.6 x 5.0 cm with associated mass effect and right were deviation of the tracheal air column at the level of the thoracic inlet (image 3, series 4).  IMPRESSION: 1. Markedly degraded examination without evidence of pulmonary embolism to the level of the bilateral subsegmental pulmonary arteries. 2. Suspected pulmonary edema with small bilateral effusions with associated bibasilar opacities, likely atelectasis. 3. Borderline enlarged caliber the main pulmonary artery, nonspecific though could be seen in the setting of pulmonary arterial hypertension. Further evaluation with cardiac echo could be performed as clinically indicated. 4. Cardiomegaly. Coronary artery calcifications. Calcifications within the mitral valve annulus. 5. Nonspecific mediastinal and hilar lymphadenopathy the etiology of which is not depicted on this examination. 6. Collapse of the central bronchial tree nonspecific though could be seen in the setting of bronchomalacia. Further evaluation with bronchoscopy and/or PFTs could be performed as clinically indicated. 7. Thyromegaly with mass effect upon the tracheal air column at the level of the thoracic inlet. Further evaluation with nonemergent thyroid ultrasound could be performed as clinically indicated. 8. Hepatic steatosis. 9. Stigmata of ankylosing spondylitis within the thoracic spine.   Electronically Signed   By: Sandi Mariscal M.D.   On: 09/23/2013 16:27   Dg Chest Port 1 View  09/24/2013   CLINICAL DATA:  Respiratory distress  EXAM: PORTABLE CHEST - 1 VIEW  COMPARISON:  DG CHEST PORT 1VSAME  DAY dated 09/23/2013  FINDINGS: Low lung volumes. Cardiac silhouette enlarged. Patient is status post median sternotomy. Left chest wall cardiac pacing unit lead tips project in the region of the right atrium and right ventricle. Mild prominence of the interstitial markings, and mild peribronchial cuffing. No focal regions of consolidation. Osseous structures grossly unremarkable.  IMPRESSION: Mild pulmonary edema without focal regions of consolidation or focal infiltrates.   Electronically Signed   By: Margaree Mackintosh M.D.   On: 09/24/2013 08:39   Dg Chest Portable 1 View  09/23/2013   CLINICAL DATA:  Shortness of breath.  EXAM: PORTABLE CHEST - 1 VIEW  COMPARISON:  January 30, 2008.  FINDINGS: Sternotomy wires are noted. Stable cardiomegaly. Interval placement of left-sided pacemaker with leads in grossly good position. Mild central pulmonary vascular congestion is with  probable minimal bilateral perihilar and basilar edema. No pneumothorax is noted. Minimal bilateral pleural effusions are noted.  IMPRESSION: Mild central pulmonary vascular congestion is now noted with probable minimal to mild bilateral perihilar and basilar edema with associated minimal pleural effusions.   Electronically Signed   By: Sabino Dick M.D.   On: 09/23/2013 14:24   Dg Chest Port 1v Same Day  09/23/2013   CLINICAL DATA:  History pulmonary embolism, postprocedure  EXAM: PORTABLE CHEST - 1 VIEW SAME DAY  COMPARISON:  CT ANGIO CHEST W/CM &/OR WO/CM dated 09/23/2013; DG CHEST 1V PORT dated 09/23/2013  FINDINGS: Patient is rotated rightward. Left-sided pacemaker overlies stable enlarged cardiac silhouette. No pulmonary edema. Small effusions are noted. No pneumothorax.  IMPRESSION: 1. Cardiomegaly and small effusions.  No change from prior.   Electronically Signed   By: Suzy Bouchard M.D.   On: 09/23/2013 23:40    CXR: Bilateral pulmonary edema, pulmonary vasculature congestion.   ASSESSMENT / PLAN:  PULMONARY A: Acute  Respiratory Failure  Secondary to pulmonary edema, likely acute on chronic CHF Pulmonary Edema   P:   Continuous BiPAP to keep SpO2 > 92% Lasix 40mg  q6 hours x3 Nitroglycerin gtt Follow CXR Follow ABG Scheduled Duoneb  CARDIOVASCULAR A:  Acute on Chronic CHF  P:  Diurese as tolerated 2D echo Check BNP Cont coreg Hold lisinopril Daily weights/ Strict I&O  RENAL A:   Hypervolemia  P:   Diurese as tolerated Monitor BMP  GASTROINTESTINAL A:   No Acute Issues  P:   Diet NPO PPI  HEMATOLOGIC A:   Pulmonary Embolism reported recent admission to [place]. On Coumadin but not therapeutic.   P:  Doppler BLE Coumadin per pharmacy Heparin gtt to bridge  INFECTIOUS A:   Leukocytosis  P:   Will monitor off ABX Pan Culture Follow WBC and fever curve Check PCT  ENDOCRINE A:   DM  P:   SSI Hold Amaryl  NEUROLOGIC A:   No acute issue  P:   Supportive Care   Georgann Housekeeper, ACNP [place] Pulmonology/Critical Care Pager [phone] or [phone]  TODAY'S SUMMARY: Will admit and monitor on BiPAP in ICU. Plan is to anticoagulate for PE, assess extent of CHF, and assess after diuresis.   I have personally obtained a history, examined the patient, evaluated laboratory and imaging results, formulated the assessment and plan and placed orders.  CRITICAL CARE: The patient is critically ill with multiple organ systems failure and requires high complexity decision making for assessment and support, frequent evaluation and titration of therapies, application of advanced monitoring technologies and extensive interpretation of multiple databases. Critical Care Time devoted to patient care services described in this note is 45 minutes.  09/24/2013, 10:00 AM  Rush Farmer, M.D. [facility] Pulmonary/Critical Care Medicine. Pager: [phone]. After hours pager: [phone].

## 2013-09-24 NOTE — Progress Notes (Signed)
ANTICOAGULATION CONSULT NOTE - Follow Up Consult  Pharmacy Consult for Heparin  Indication: pulmonary embolus  No Known Allergies  Patient Measurements: Height: 5\' 2"  (157.5 cm) Weight: 228 lb 6.3 oz (103.6 kg) IBW/kg (Calculated) : 50.1 Heparin Dosing Weight: ~74 kg  Vital Signs: Temp: 97.5 F (36.4 C) (03/20 1416) Temp src: Axillary (03/20 1416) BP: 126/58 mmHg (03/21 0000) Pulse Rate: 69 (03/21 0000)  Labs:  Recent Labs  09/23/13 1404 09/23/13 1426 09/23/13 1856 09/23/13 2326  HGB 9.8* 10.9* 8.6*  --   HCT 31.0* 32.0* 27.5*  --   PLT 293  --  266  --   APTT  --   --  39*  --   LABPROT 20.9*  --  21.3*  --   INR 1.86*  --  1.91*  --   HEPARINUNFRC  --   --   --  0.30  CREATININE 0.85 1.00 0.85  --     Estimated Creatinine Clearance: 58.6 ml/min (by C-G formula based on Cr of 0.85).   Medications:  Heparin 1100 units/hr  Assessment: 80 y/o F on warfarin PTA for PE, now on heparin until INR is >2. HL is 0.3 this AM. Per RN, pt with some mild, stable hematuria (RN to call if it worsens), noticed drop in Hgb (will monitor closely).   Goal of Therapy:  Heparin level 0.3-0.7 units/ml Monitor platelets by anticoagulation protocol: Yes   Plan:  -Increase heparin cautiously to 1250 units/hr -0900 HL -Daily CBC/HL -Monitor hematuria, trend Hgb  Narda Bonds 09/24/2013,1:01 AM

## 2013-09-24 NOTE — Progress Notes (Signed)
PULMONARY / CRITICAL CARE MEDICINE   Name: Kimberly Peterson MRN: [id number] DOB: 25-Sep-1929    ADMISSION DATE:  09/23/2013 CONSULTATION DATE:  09/23/2013  REFERRING MD :  EDP PRIMARY SERVICE: PCCM  CHIEF COMPLAINT:  Hypoxia  BRIEF PATIENT DESCRIPTION: 80 year old female with PMH of HTN and DM. Recent discharged from [facility] for PE. Presents with SOB and hypoxia 3/20. PCCM asked to admit.  SIGNIFICANT EVENTS / STUDIES:  3/20 admitted to [facility] ICU 3/20 2D echo >>> 3/20 Doppler BLE >>>  LINES / TUBES: PIV  CULTURES: Blood 3/20 >>> Urine 3/20 >>>  ANTIBIOTICS: None  SUBJECTIVE:  Doing much better  VITAL SIGNS: Temp:  [97.5 F (36.4 C)-98.4 F (36.9 C)] 98.4 F (36.9 C) (03/21 0814) Pulse Rate:  [69-83] 78 (03/21 0900) Resp:  [14-26] 20 (03/21 0900) BP: (97-177)/(44-78) 152/55 mmHg (03/21 0900) SpO2:  [90 %-100 %] 97 % (03/21 0900) FiO2 (%):  [75 %] 75 % (03/20 1415) Weight:  [103.6 kg (228 lb 6.3 oz)] 103.6 kg (228 lb 6.3 oz) (03/20 2200) HEMODYNAMICS: cv stable   [place] oxygen sats ok INTAKE / OUTPUT: Intake/Output     03/20 0701 - 03/21 0700 03/21 0701 - 03/22 0700   I.V. (mL/kg) 469.9 (4.5) 125 (1.2)   Total Intake(mL/kg) 469.9 (4.5) 125 (1.2)   Urine (mL/kg/hr) 2500    Total Output 2500     Net -2030.1 +125          PHYSICAL EXAMINATION: General:  Obese female off bipap Neuro:  Alert, oriented HEENT:  [place]/AT, no JVD noted.  Cardiovascular:  RRR Lungs:  Diminished, decreased rales Abdomen:  Soft, obese, non-tender Musculoskeletal:  No acute deformity Skin:  Intact  LABS:  CBC  Recent Labs Lab 09/23/13 1404 09/23/13 1426 09/23/13 1856 09/24/13 0230  WBC 13.4*  --  10.2 9.4  HGB 9.8* 10.9* 8.6* 8.6*  HCT 31.0* 32.0* 27.5* 27.5*  PLT 293  --  266 260   Coag's  Recent Labs Lab 09/23/13 1404 09/23/13 1856 09/24/13 0230  APTT  --  39*  --   INR 1.86* 1.91* 2.23*   BMET  Recent Labs Lab 09/23/13 1404 09/23/13 1426 09/23/13 1856  09/24/13 0230  NA 136* 136* 136* 139  K 4.9 4.7 4.9 4.0  CL 93* 93* 92* 95*  CO2 31  --  32 34*  BUN 16 19 18 18   CREATININE 0.85 1.00 0.85 0.97  GLUCOSE 282* 283* 289* 91   Electrolytes  Recent Labs Lab 09/23/13 1404 09/23/13 1856 09/24/13 0230  CALCIUM 9.1 9.0 8.9  MG  --  2.1 2.0  PHOS  --  4.0 4.1   Sepsis Markers  Recent Labs Lab 09/23/13 1415  LATICACIDVEN 1.09   ABG  Recent Labs Lab 09/23/13 1434 09/23/13 1800  PHART 7.291* 7.380  PCO2ART 76.9* 59.1*  PO2ART 270.0* 73.0*   Liver Enzymes  Recent Labs Lab 09/23/13 1404 09/23/13 1856  AST 34 32  ALT 20 22  ALKPHOS 92 86  BILITOT 0.5 0.5  ALBUMIN 3.2* 3.0*   Cardiac Enzymes  Recent Labs Lab 09/23/13 1404 09/23/13 1856  PROBNP 2533.0* 2961.0*   Glucose  Recent Labs Lab 09/23/13 2225 09/23/13 2245 09/23/13 2351 09/24/13 0055 09/24/13 0114 09/24/13 0724  GLUCAP 171* 149* 74 48* 102* 142*    Imaging Ct Angio Chest Pe W/cm &/or Wo Cm  09/23/2013   CLINICAL DATA:  Shortness of breath, history of pulmonary embolism, hypertension, diabetes, evaluate for pulmonary embolism.  EXAM: CT  ANGIOGRAPHY CHEST WITH CONTRAST  TECHNIQUE: Multidetector CT imaging of the chest was performed using the standard protocol during bolus administration of intravenous contrast. Multiplanar CT image reconstructions and MIPs were obtained to evaluate the vascular anatomy.  CONTRAST:  77mL OMNIPAQUE IOHEXOL 350 MG/ML SOLN  COMPARISON:  DG CHEST 1V PORT dated 09/23/2013  FINDINGS: Vascular Findings:  There is adequate opacification of the pulmonary arterial system with the main pulmonary artery measuring 296 Hounsfield units. There are no discrete filling defects within the pulmonary arterial tree to the level of the bilateral subsegmental pulmonary arteries. Evaluation of distal subsegmental pulmonary arteries is degraded secondary to a combination of suboptimal vessel opacification and patient body habitus. Borderline  enlarged caliber the main pulmonary artery measuring 32 mm in diameter.  Post median sternotomy. Coronary artery calcifications. Calcifications within the mitral valve annulus. Left anterior chest wall dual lead pacemaker with lead tips terminating within the right atrium and ventricle. No pericardial effusion. Scattered atherosclerotic plaque within a normal caliber thoracic aorta.  Review of the MIP images confirms the above findings.   ----------------------------------------------------------------------------------  Nonvascular Findings:  Examination is markedly degraded secondary to patient body habitus.  Small bilateral pleural effusions with associated dependent bibasilar opacities, right greater left. No definitive air bronchograms. No pneumothorax. There is mild interlobular septal thickening most conspicuous within the bilateral upper lungs. There is collapse of the right and left mainstem bronchi as well as multiple segmental bronchi. No pneumothorax.  Mediastinal and hilar lymphadenopathy with index precarinal lymph node measuring 1.3 cm in greatest short axis diameter (image 27, series 4, index AP window lymph node measuring 1.5 cm (image 27), index right suprahilar lymph node measuring 1.8 cm (image 33). No definite axillary lymphadenopathy.  There is diffuse decreased attenuation of the hepatic parenchyma suggestive of hepatic steatosis.  Stigmata of ankylosing spondylitis within the thoracic spine. Mildly accentuated thoracic kyphosis. Vertebral body heights appear preserved.  There is diffuse enlargement of the imaged thyroid gland which appears diffusely heterogeneous. The caudal aspect of the left lobe of the thyroid measuring at least 6.6 x 5.0 cm with associated mass effect and right were deviation of the tracheal air column at the level of the thoracic inlet (image 3, series 4).  IMPRESSION: 1. Markedly degraded examination without evidence of pulmonary embolism to the level of the bilateral  subsegmental pulmonary arteries. 2. Suspected pulmonary edema with small bilateral effusions with associated bibasilar opacities, likely atelectasis. 3. Borderline enlarged caliber the main pulmonary artery, nonspecific though could be seen in the setting of pulmonary arterial hypertension. Further evaluation with cardiac echo could be performed as clinically indicated. 4. Cardiomegaly. Coronary artery calcifications. Calcifications within the mitral valve annulus. 5. Nonspecific mediastinal and hilar lymphadenopathy the etiology of which is not depicted on this examination. 6. Collapse of the central bronchial tree nonspecific though could be seen in the setting of bronchomalacia. Further evaluation with bronchoscopy and/or PFTs could be performed as clinically indicated. 7. Thyromegaly with mass effect upon the tracheal air column at the level of the thoracic inlet. Further evaluation with nonemergent thyroid ultrasound could be performed as clinically indicated. 8. Hepatic steatosis. 9. Stigmata of ankylosing spondylitis within the thoracic spine.   Electronically Signed   By: Sandi Mariscal M.D.   On: 09/23/2013 16:27   Dg Chest Port 1 View  09/24/2013   CLINICAL DATA:  Respiratory distress  EXAM: PORTABLE CHEST - 1 VIEW  COMPARISON:  DG CHEST PORT 1VSAME DAY dated 09/23/2013  FINDINGS: Low lung volumes. Cardiac  silhouette enlarged. Patient is status post median sternotomy. Left chest wall cardiac pacing unit lead tips project in the region of the right atrium and right ventricle. Mild prominence of the interstitial markings, and mild peribronchial cuffing. No focal regions of consolidation. Osseous structures grossly unremarkable.  IMPRESSION: Mild pulmonary edema without focal regions of consolidation or focal infiltrates.   Electronically Signed   By: Margaree Mackintosh M.D.   On: 09/24/2013 08:39   Dg Chest Portable 1 View  09/23/2013   CLINICAL DATA:  Shortness of breath.  EXAM: PORTABLE CHEST - 1 VIEW   COMPARISON:  January 30, 2008.  FINDINGS: Sternotomy wires are noted. Stable cardiomegaly. Interval placement of left-sided pacemaker with leads in grossly good position. Mild central pulmonary vascular congestion is with probable minimal bilateral perihilar and basilar edema. No pneumothorax is noted. Minimal bilateral pleural effusions are noted.  IMPRESSION: Mild central pulmonary vascular congestion is now noted with probable minimal to mild bilateral perihilar and basilar edema with associated minimal pleural effusions.   Electronically Signed   By: Sabino Dick M.D.   On: 09/23/2013 14:24   Dg Chest Port 1v Same Day  09/23/2013   CLINICAL DATA:  History pulmonary embolism, postprocedure  EXAM: PORTABLE CHEST - 1 VIEW SAME DAY  COMPARISON:  CT ANGIO CHEST W/CM &/OR WO/CM dated 09/23/2013; DG CHEST 1V PORT dated 09/23/2013  FINDINGS: Patient is rotated rightward. Left-sided pacemaker overlies stable enlarged cardiac silhouette. No pulmonary edema. Small effusions are noted. No pneumothorax.  IMPRESSION: 1. Cardiomegaly and small effusions.  No change from prior.   Electronically Signed   By: Suzy Bouchard M.D.   On: 09/23/2013 23:40    [id number] edema  ASSESSMENT / PLAN:  PULMONARY A: Acute Respiratory Failure  Secondary to pulmonary edema, likely acute on chronic CHF Pulmonary Edema No pulmonary embolism on CTA chest 3/20   P:   Off bipap On oxygen Cont Lasix 40mg  q12H Nitroglycerin gtt OFF Follow CXR Mobilize tfr to tele Scheduled Duoneb  CARDIOVASCULAR A:  Acute on Chronic CHF  P:  Diurese as tolerated F/u 2D echo Cont coreg Hold lisinopril Daily weights/ Strict I&O  RENAL A:   Hypervolemia  P:   Diurese as tolerated Monitor BMP  GASTROINTESTINAL A:   No Acute Issues  P:   Advance diet PPI  HEMATOLOGIC A:   Pulmonary Embolism reported recent admission to [place]. On Coumadin but not therapeutic.   P:  Doppler BLE Coumadin per pharmacy Heparin gtt  to bridge  INFECTIOUS A:   Leukocytosis  P:   Will monitor off ABX Pan Culture Doubt infection  ENDOCRINE A:   DM  P:   SSI Hold Amaryl  NEUROLOGIC A:   No acute issue  P:   Supportive Care  TODAY'S SUMMARY: tfr to tele, cont diuresis, f/u echo, cont hep drip and bridge to therapeutic coumadin  I have personally obtained a history, examined the patient, evaluated laboratory and imaging results, formulated the assessment and plan and placed orders. CRITICAL CARE: The patient is critically ill with multiple organ systems failure and requires high complexity decision making for assessment and support, frequent evaluation and titration of therapies, application of advanced monitoring technologies and extensive interpretation of multiple databases. Critical Care Time devoted to patient care services described in this note is 30  minutes.   Mariel Sleet [facility] Pulmonology/Critical Care Beeper  [phone]  Cell  [phone]  If no response or cell goes to voicemail, call beeper [phone]   09/24/2013, 9:34  AM

## 2013-09-24 NOTE — Progress Notes (Signed)
UR completed 

## 2013-09-25 DIAGNOSIS — J9600 Acute respiratory failure, unspecified whether with hypoxia or hypercapnia: Secondary | ICD-10-CM | POA: Diagnosis not present

## 2013-09-25 DIAGNOSIS — J811 Chronic pulmonary edema: Secondary | ICD-10-CM | POA: Diagnosis not present

## 2013-09-25 DIAGNOSIS — I509 Heart failure, unspecified: Secondary | ICD-10-CM | POA: Diagnosis not present

## 2013-09-25 DIAGNOSIS — I059 Rheumatic mitral valve disease, unspecified: Secondary | ICD-10-CM | POA: Diagnosis not present

## 2013-09-25 DIAGNOSIS — I2699 Other pulmonary embolism without acute cor pulmonale: Secondary | ICD-10-CM | POA: Diagnosis not present

## 2013-09-25 LAB — BASIC METABOLIC PANEL
BUN: 16 mg/dL (ref 6–23)
CHLORIDE: 94 meq/L — AB (ref 96–112)
CO2: 34 mEq/L — ABNORMAL HIGH (ref 19–32)
CREATININE: 0.91 mg/dL (ref 0.50–1.10)
Calcium: 9 mg/dL (ref 8.4–10.5)
GFR, EST AFRICAN AMERICAN: 67 mL/min — AB (ref >90)
GFR, EST NON AFRICAN AMERICAN: 58 mL/min — AB (ref >90)
Glucose, Bld: 138 mg/dL — ABNORMAL HIGH (ref 70–99)
POTASSIUM: 3.8 meq/L (ref 3.7–5.3)
Sodium: 139 mEq/L (ref 137–147)

## 2013-09-25 LAB — CBC
HEMATOCRIT: 29.3 % — AB (ref 36.0–46.0)
HEMOGLOBIN: 9.1 g/dL — AB (ref 12.0–15.0)
MCH: 27.7 pg (ref 26.0–34.0)
MCHC: 31.1 g/dL (ref 30.0–36.0)
MCV: 89.1 fL (ref 78.0–100.0)
Platelets: 275 10*3/uL (ref 150–400)
RBC: 3.29 MIL/uL — ABNORMAL LOW (ref 3.87–5.11)
RDW: 14.6 % (ref 11.5–15.5)
WBC: 7.8 10*3/uL (ref 4.0–10.5)

## 2013-09-25 LAB — PROTIME-INR
INR: 2.17 — AB (ref 0.00–1.49)
Prothrombin Time: 23.5 seconds — ABNORMAL HIGH (ref 11.6–15.2)

## 2013-09-25 LAB — GLUCOSE, CAPILLARY
GLUCOSE-CAPILLARY: 177 mg/dL — AB (ref 70–99)
Glucose-Capillary: 158 mg/dL — ABNORMAL HIGH (ref 70–99)
Glucose-Capillary: 162 mg/dL — ABNORMAL HIGH (ref 70–99)
Glucose-Capillary: 165 mg/dL — ABNORMAL HIGH (ref 70–99)

## 2013-09-25 MED ORDER — GLIMEPIRIDE 2 MG PO TABS: 2.0000 mg | ORAL_TABLET | Freq: Every day | ORAL | Status: DC

## 2013-09-25 MED ORDER — INSULIN ASPART 100 UNIT/ML ~~LOC~~ SOLN
0.0000 [IU] | Freq: Three times a day (TID) | SUBCUTANEOUS | Status: DC
  Administered 2013-09-25 (×3): 3 [IU] via SUBCUTANEOUS
  Administered 2013-09-26: 5 [IU] via SUBCUTANEOUS
  Administered 2013-09-26: 3 [IU] via SUBCUTANEOUS
  Administered 2013-09-27: 5 [IU] via SUBCUTANEOUS
  Administered 2013-09-27: 3 [IU] via SUBCUTANEOUS
  Administered 2013-09-27 – 2013-09-28 (×2): 2 [IU] via SUBCUTANEOUS
  Administered 2013-09-28: 11 [IU] via SUBCUTANEOUS
  Administered 2013-09-28: 5 [IU] via SUBCUTANEOUS
  Administered 2013-09-29: 2 [IU] via SUBCUTANEOUS

## 2013-09-25 MED ORDER — METOLAZONE 2.5 MG PO TABS
2.5000 mg | ORAL_TABLET | Freq: Once | ORAL | Status: AC
  Administered 2013-09-25: 2.5 mg via ORAL
  Filled 2013-09-25: qty 1

## 2013-09-25 MED ORDER — WARFARIN SODIUM 5 MG PO TABS
5.0000 mg | ORAL_TABLET | Freq: Once | ORAL | Status: AC
  Administered 2013-09-25: 5 mg via ORAL
  Filled 2013-09-25: qty 1

## 2013-09-25 MED ORDER — INSULIN ASPART PROT & ASPART (70-30 MIX) 100 UNIT/ML ~~LOC~~ SUSP
30.0000 [IU] | Freq: Every day | SUBCUTANEOUS | Status: DC
  Administered 2013-09-26 – 2013-09-29 (×4): 30 [IU] via SUBCUTANEOUS
  Filled 2013-09-25: qty 10

## 2013-09-25 MED ORDER — INSULIN ASPART 100 UNIT/ML ~~LOC~~ SOLN
0.0000 [IU] | Freq: Every day | SUBCUTANEOUS | Status: DC
  Administered 2013-09-27: 4 [IU] via SUBCUTANEOUS

## 2013-09-25 MED ORDER — INSULIN ASPART PROT & ASPART (70-30 MIX) 100 UNIT/ML ~~LOC~~ SUSP: 28.0000 [IU] | Freq: Two times a day (BID) | SUBCUTANEOUS | Status: DC

## 2013-09-25 MED ORDER — INSULIN ASPART PROT & ASPART (70-30 MIX) 100 UNIT/ML ~~LOC~~ SUSP
15.0000 [IU] | Freq: Every day | SUBCUTANEOUS | Status: DC
  Administered 2013-09-25 – 2013-09-28 (×4): 15 [IU] via SUBCUTANEOUS

## 2013-09-25 MED ORDER — DEXTROSE 5 % IV SOLN
8.0000 mg/h | INTRAVENOUS | Status: DC
  Administered 2013-09-25 – 2013-09-26 (×2): 8 mg/h via INTRAVENOUS
  Filled 2013-09-25 (×3): qty 25

## 2013-09-25 MED ORDER — INSULIN ASPART 100 UNIT/ML ~~LOC~~ SOLN
3.0000 [IU] | Freq: Three times a day (TID) | SUBCUTANEOUS | Status: DC
  Administered 2013-09-25 – 2013-09-29 (×14): 3 [IU] via SUBCUTANEOUS

## 2013-09-25 NOTE — Progress Notes (Signed)
[facility] PROGRESS NOTE Interim History: 80 year old female with PMH of HTN and DM. Recent discharged from [facility] for PE. Presents with SOB and hypoxia 3/20. PCCM asked to admit  [facility] Weights   09/23/13 2200 09/24/13 1536 09/25/13 0424  Weight: 103.6 kg (228 lb 6.3 oz) 100.472 kg (221 lb 8 oz) 101.243 kg (223 lb 3.2 oz)        Intake/Output Summary (Last 24 hours) at 09/25/13 0755 Last data filed at 09/25/13 0429  Gross per 24 hour  Intake    870 ml  Output   2300 ml  Net  -1430 ml     Assessment/Plan: Acute respiratory failure due to acute on chronic diastolic heart failure/Pulmonary edema: - Requiring bipap on admission. Now off bipap. - off NTG. Start lasix ggt plus metolazone. - ted hose, restrict Fluids, low sodium diet, strict I and O's. - monitor electrolytes replete as needed.  Pulmonary embolism: - heparin IV and coumadin per pharmacy.  Diabetes mellitus: - cont SSI , add 70/30.     Code Status: full Family Communication: none  Disposition Plan: inpatient   Consultants:  PCCM  Procedures: ECHO: pending  Antibiotics:  None  HPI/Subjective: No complains SOB improved.  Objective: Filed Vitals:   09/25/13 0021 09/25/13 0410 09/25/13 0424 09/25/13 0744  BP:   139/50   Pulse: 72 60 88   Temp:   98 F (36.7 C)   TempSrc:   Oral   Resp: 16 16 18    Height:      Weight:   101.243 kg (223 lb 3.2 oz)   SpO2: 95% 94% 96% 95%     Exam:  General: Alert, awake, oriented x3, in no acute distress.  HEENT: No bruits, no goiter. +JVD Heart: Regular rate and rhythm, without murmurs, rubs, gallops.  Lungs: Good air movement, crackles b/l at bases. Abdomen: Soft, nontender, nondistended, positive bowel sounds.  Neuro: Grossly intact, nonfocal.   Data Reviewed: Basic Metabolic Panel:  Recent Labs Lab 09/23/13 1404 09/23/13 1426 09/23/13 1856 09/24/13 0230 09/25/13 0520  NA 136* 136* 136* 139 139  K 4.9 4.7 4.9 4.0 3.8  CL 93*  93* 92* 95* 94*  CO2 31  --  32 34* 34*  GLUCOSE 282* 283* 289* 91 138*  BUN 16 19 18 18 16   CREATININE 0.85 1.00 0.85 0.97 0.91  CALCIUM 9.1  --  9.0 8.9 9.0  MG  --   --  2.1 2.0  --   PHOS  --   --  4.0 4.1  --    Liver Function Tests:  Recent Labs Lab 09/23/13 1404 09/23/13 1856  AST 34 32  ALT 20 22  ALKPHOS 92 86  BILITOT 0.5 0.5  PROT 7.7 7.2  ALBUMIN 3.2* 3.0*   No results found for this basename: LIPASE, AMYLASE,  in the last 168 hours No results found for this basename: AMMONIA,  in the last 168 hours CBC:  Recent Labs Lab 09/23/13 1404 09/23/13 1426 09/23/13 1856 09/24/13 0230 09/25/13 0520  WBC 13.4*  --  10.2 9.4 7.8  NEUTROABS 11.4*  --  8.7*  --   --   HGB 9.8* 10.9* 8.6* 8.6* 9.1*  HCT 31.0* 32.0* 27.5* 27.5* 29.3*  MCV 88.3  --  87.9 88.4 89.1  PLT 293  --  266 260 275   Cardiac Enzymes: No results found for this basename: CKTOTAL, CKMB, CKMBINDEX, TROPONINI,  in the last 168 hours BNP (last 3 results)  Recent Labs  09/23/13 1404 09/23/13 1856  PROBNP 2533.0* 2961.0*   CBG:  Recent Labs Lab 09/24/13 0724 09/24/13 1128 09/24/13 1626 09/24/13 2106 09/25/13 0607  GLUCAP 142* 132* 250* 251* 158*    Recent Results (from the past 240 hour(s))  URINE CULTURE     Status: None   Collection Time    09/23/13  5:54 PM      Result Value Ref Range Status   Specimen Description URINE, CATHETERIZED   Final   Special Requests NONE   Final   Culture  Setup Time     Final   Value: 09/23/2013 18:54     Performed at [facility]     Final   Value: NO GROWTH     Performed at [facility]   Culture     Final   Value: NO GROWTH     Performed at [facility]   Report Status 09/24/2013 FINAL   Final  MRSA PCR SCREENING     Status: None   Collection Time    09/23/13  6:15 PM      Result Value Ref Range Status   MRSA by PCR NEGATIVE  NEGATIVE Final   Comment:            The GeneXpert MRSA Assay (FDA      approved for NASAL specimens     only), is one component of a     comprehensive MRSA colonization     surveillance program. It is not     intended to diagnose MRSA     infection nor to guide or     monitor treatment for     MRSA infections.     Studies: Ct Angio Chest Pe W/cm &/or Wo Cm  09/23/2013   CLINICAL DATA:  Shortness of breath, history of pulmonary embolism, hypertension, diabetes, evaluate for pulmonary embolism.  EXAM: CT ANGIOGRAPHY CHEST WITH CONTRAST  TECHNIQUE: Multidetector CT imaging of the chest was performed using the standard protocol during bolus administration of intravenous contrast. Multiplanar CT image reconstructions and MIPs were obtained to evaluate the vascular anatomy.  CONTRAST:  65mL OMNIPAQUE IOHEXOL 350 MG/ML SOLN  COMPARISON:  DG CHEST 1V PORT dated 09/23/2013  FINDINGS: Vascular Findings:  There is adequate opacification of the pulmonary arterial system with the main pulmonary artery measuring 296 Hounsfield units. There are no discrete filling defects within the pulmonary arterial tree to the level of the bilateral subsegmental pulmonary arteries. Evaluation of distal subsegmental pulmonary arteries is degraded secondary to a combination of suboptimal vessel opacification and patient body habitus. Borderline enlarged caliber the main pulmonary artery measuring 32 mm in diameter.  Post median sternotomy. Coronary artery calcifications. Calcifications within the mitral valve annulus. Left anterior chest wall dual lead pacemaker with lead tips terminating within the right atrium and ventricle. No pericardial effusion. Scattered atherosclerotic plaque within a normal caliber thoracic aorta.  Review of the MIP images confirms the above findings.   ----------------------------------------------------------------------------------  Nonvascular Findings:  Examination is markedly degraded secondary to patient body habitus.  Small bilateral pleural effusions with associated  dependent bibasilar opacities, right greater left. No definitive air bronchograms. No pneumothorax. There is mild interlobular septal thickening most conspicuous within the bilateral upper lungs. There is collapse of the right and left mainstem bronchi as well as multiple segmental bronchi. No pneumothorax.  Mediastinal and hilar lymphadenopathy with index precarinal lymph node measuring 1.3 cm in greatest short axis diameter (image 27,  series 4, index AP window lymph node measuring 1.5 cm (image 27), index right suprahilar lymph node measuring 1.8 cm (image 33). No definite axillary lymphadenopathy.  There is diffuse decreased attenuation of the hepatic parenchyma suggestive of hepatic steatosis.  Stigmata of ankylosing spondylitis within the thoracic spine. Mildly accentuated thoracic kyphosis. Vertebral body heights appear preserved.  There is diffuse enlargement of the imaged thyroid gland which appears diffusely heterogeneous. The caudal aspect of the left lobe of the thyroid measuring at least 6.6 x 5.0 cm with associated mass effect and right were deviation of the tracheal air column at the level of the thoracic inlet (image 3, series 4).  IMPRESSION: 1. Markedly degraded examination without evidence of pulmonary embolism to the level of the bilateral subsegmental pulmonary arteries. 2. Suspected pulmonary edema with small bilateral effusions with associated bibasilar opacities, likely atelectasis. 3. Borderline enlarged caliber the main pulmonary artery, nonspecific though could be seen in the setting of pulmonary arterial hypertension. Further evaluation with cardiac echo could be performed as clinically indicated. 4. Cardiomegaly. Coronary artery calcifications. Calcifications within the mitral valve annulus. 5. Nonspecific mediastinal and hilar lymphadenopathy the etiology of which is not depicted on this examination. 6. Collapse of the central bronchial tree nonspecific though could be seen in the  setting of bronchomalacia. Further evaluation with bronchoscopy and/or PFTs could be performed as clinically indicated. 7. Thyromegaly with mass effect upon the tracheal air column at the level of the thoracic inlet. Further evaluation with nonemergent thyroid ultrasound could be performed as clinically indicated. 8. Hepatic steatosis. 9. Stigmata of ankylosing spondylitis within the thoracic spine.   Electronically Signed   By: Sandi Mariscal M.D.   On: 09/23/2013 16:27   Dg Chest Port 1 View  09/24/2013   CLINICAL DATA:  Respiratory distress  EXAM: PORTABLE CHEST - 1 VIEW  COMPARISON:  DG CHEST PORT 1VSAME DAY dated 09/23/2013  FINDINGS: Low lung volumes. Cardiac silhouette enlarged. Patient is status post median sternotomy. Left chest wall cardiac pacing unit lead tips project in the region of the right atrium and right ventricle. Mild prominence of the interstitial markings, and mild peribronchial cuffing. No focal regions of consolidation. Osseous structures grossly unremarkable.  IMPRESSION: Mild pulmonary edema without focal regions of consolidation or focal infiltrates.   Electronically Signed   By: Margaree Mackintosh M.D.   On: 09/24/2013 08:39   Dg Chest Portable 1 View  09/23/2013   CLINICAL DATA:  Shortness of breath.  EXAM: PORTABLE CHEST - 1 VIEW  COMPARISON:  January 30, 2008.  FINDINGS: Sternotomy wires are noted. Stable cardiomegaly. Interval placement of left-sided pacemaker with leads in grossly good position. Mild central pulmonary vascular congestion is with probable minimal bilateral perihilar and basilar edema. No pneumothorax is noted. Minimal bilateral pleural effusions are noted.  IMPRESSION: Mild central pulmonary vascular congestion is now noted with probable minimal to mild bilateral perihilar and basilar edema with associated minimal pleural effusions.   Electronically Signed   By: Sabino Dick M.D.   On: 09/23/2013 14:24   Dg Chest Port 1v Same Day  09/23/2013   CLINICAL DATA:  History  pulmonary embolism, postprocedure  EXAM: PORTABLE CHEST - 1 VIEW SAME DAY  COMPARISON:  CT ANGIO CHEST W/CM &/OR WO/CM dated 09/23/2013; DG CHEST 1V PORT dated 09/23/2013  FINDINGS: Patient is rotated rightward. Left-sided pacemaker overlies stable enlarged cardiac silhouette. No pulmonary edema. Small effusions are noted. No pneumothorax.  IMPRESSION: 1. Cardiomegaly and small effusions.  No change from prior.  Electronically Signed   By: Suzy Bouchard M.D.   On: 09/23/2013 23:40    Scheduled Meds: . aspirin  81 mg Oral Daily  . carvedilol  12.5 mg Oral BID WC  . hydrALAZINE  25 mg Oral TID  . insulin aspart  0-15 Units Subcutaneous TID AC & HS  . ipratropium-albuterol  3 mL Nebulization Q4H  . pantoprazole  40 mg Oral Daily  . Warfarin - Pharmacist Dosing Inpatient   Does not apply q1800   Continuous Infusions:    Charlynne Cousins  [facility] Pager [phone] If 8PM-8AM, please contact night-coverage at [email or web address], password [facility] 09/25/2013, 7:55 AM  LOS: 2 days

## 2013-09-25 NOTE — Progress Notes (Signed)
Echo Lab  2D Echocardiogram completed.  [place], RDCS 09/25/2013 10:33 AM

## 2013-09-25 NOTE — Progress Notes (Signed)
ANTICOAGULATION CONSULT NOTE - Follow Up Consult  Pharmacy Consult for Coumadin Indication: pulmonary embolus  No Known Allergies  Labs:  Recent Labs  09/23/13 1426 09/23/13 1856 09/23/13 2326 09/24/13 0230 09/24/13 0950 09/25/13 0520  HGB 10.9* 8.6*  --  8.6*  --  9.1*  HCT 32.0* 27.5*  --  27.5*  --  29.3*  PLT  --  266  --  260  --  275  APTT  --  39*  --   --   --   --   LABPROT  --  21.3*  --  24.0*  --  23.5*  INR  --  1.91*  --  2.23*  --  2.17*  HEPARINUNFRC  --   --  0.30  --  0.15*  --   CREATININE 1.00 0.85  --  0.97  --  0.91   Assessment: 80 yo F on Coumadin PTA for recent PE diagnosed at [facility]  INR on admission was subtherapeutic and pt was started on heparin.  INR remains therapeutic with slight decrease today.  CTA neg PE.   Home Coumadin dose of 4 mg daily. Home Coumadin regimen later confirmed with daughter, Noe Gens.   Goal of Therapy:  INR 2-3 Monitor platelets by anticoagulation protocol: Yes   Plan:  Repeat Coumadin 5 mg tonight.   Follow INR trend.   Daily INR  Thank you, Vivia Ewing, PharmD Clinical Pharmacist - Resident Pager: [phone] Pharmacy: [phone] 09/25/2013 11:42 AM

## 2013-09-26 DIAGNOSIS — J9600 Acute respiratory failure, unspecified whether with hypoxia or hypercapnia: Secondary | ICD-10-CM | POA: Diagnosis not present

## 2013-09-26 DIAGNOSIS — I5031 Acute diastolic (congestive) heart failure: Secondary | ICD-10-CM

## 2013-09-26 DIAGNOSIS — I2699 Other pulmonary embolism without acute cor pulmonale: Secondary | ICD-10-CM | POA: Diagnosis not present

## 2013-09-26 DIAGNOSIS — J811 Chronic pulmonary edema: Secondary | ICD-10-CM | POA: Diagnosis not present

## 2013-09-26 LAB — GLUCOSE, CAPILLARY
GLUCOSE-CAPILLARY: 224 mg/dL — AB (ref 70–99)
GLUCOSE-CAPILLARY: 157 mg/dL — AB (ref 70–99)
Glucose-Capillary: 122 mg/dL — ABNORMAL HIGH (ref 70–99)
Glucose-Capillary: 136 mg/dL — ABNORMAL HIGH (ref 70–99)

## 2013-09-26 LAB — BASIC METABOLIC PANEL
BUN: 17 mg/dL (ref 6–23)
CALCIUM: 9.9 mg/dL (ref 8.4–10.5)
CO2: 35 mEq/L — ABNORMAL HIGH (ref 19–32)
Chloride: 87 mEq/L — ABNORMAL LOW (ref 96–112)
Creatinine, Ser: 1.08 mg/dL (ref 0.50–1.10)
GFR calc Af Amer: 54 mL/min — ABNORMAL LOW (ref >90)
GFR calc non Af Amer: 47 mL/min — ABNORMAL LOW (ref >90)
Glucose, Bld: 177 mg/dL — ABNORMAL HIGH (ref 70–99)
Potassium: 3.5 mEq/L — ABNORMAL LOW (ref 3.7–5.3)
SODIUM: 139 meq/L (ref 137–147)

## 2013-09-26 LAB — PROTIME-INR
INR: 1.99 — AB (ref 0.00–1.49)
PROTHROMBIN TIME: 22 s — AB (ref 11.6–15.2)

## 2013-09-26 LAB — HEMOGLOBIN A1C
Hgb A1c MFr Bld: 6.6 % — ABNORMAL HIGH (ref <5.7)
Mean Plasma Glucose: 143 mg/dL — ABNORMAL HIGH (ref <117)

## 2013-09-26 MED ORDER — WARFARIN SODIUM 6 MG PO TABS
6.0000 mg | ORAL_TABLET | Freq: Once | ORAL | Status: AC
  Administered 2013-09-26: 6 mg via ORAL
  Filled 2013-09-26: qty 1

## 2013-09-26 MED ORDER — IPRATROPIUM-ALBUTEROL 0.5-2.5 (3) MG/3ML IN SOLN
3.0000 mL | Freq: Three times a day (TID) | RESPIRATORY_TRACT | Status: DC
  Administered 2013-09-26 – 2013-09-29 (×9): 3 mL via RESPIRATORY_TRACT
  Filled 2013-09-26 (×10): qty 3

## 2013-09-26 NOTE — Evaluation (Signed)
Physical Therapy Evaluation Patient Details Name: Kimberly Peterson MRN: [id number] DOB: 08-13-1929 Today's Date: 09/26/2013 Time: 3557-3220 PT Time Calculation (min): 28 min  PT Assessment / Plan / Recommendation History of Present Illness  80 year old female with PMH of HTN and DM. Recent discharged from [facility] for PE. Presents with SOB and hypoxia 3/20.  Patient with acute resp failure, heart failure, pulm edema.  Patient has pacemaker.  Clinical Impression  Patient presents with problems listed below.  Will benefit from acute PT to maximize independence prior to discharge home with daughter.  Patient was receiving [place] services pta.  Recommend patient receive HHPT for continued therapy at discharge.    PT Assessment  Patient needs continued PT services    Follow Up Recommendations  Home health PT;Supervision/Assistance - 24 hour    Does the patient have the potential to tolerate intense rehabilitation      Barriers to Discharge        Equipment Recommendations  None recommended by PT    Recommendations for Other Services     Frequency Min 3X/week    Precautions / Restrictions Precautions Precautions: Fall Restrictions Weight Bearing Restrictions: No   Pertinent Vitals/Pain       Mobility  Transfers Overall transfer level: Needs assistance Equipment used: Rolling walker (2 wheeled) Transfers: Sit to/from [facility] Sit to Stand: Supervision Stand pivot transfers: Modified independent (Device/Increase time) General transfer comment: Verbal cues for hand placement.  Supervision for sit <> stand for safety only.  Fairly good balance once upright.  Patient able to transfer chair <> BSC using arm rests on her own safely. Ambulation/Gait Ambulation/Gait assistance: Min guard Ambulation Distance (Feet): 70 Feet Assistive device: Rolling walker (2 wheeled) Gait Pattern/deviations: Step-through pattern;Decreased step length - right;Decreased step length -  left;Shuffle;Trunk flexed Gait velocity: Slow gait speed Gait velocity interpretation: Below normal speed for age/gender General Gait Details: Patient able to maneuver RW safely.  Cues to stand upright and look forward during gait.  Patient with dyspnea 3/4 with gait on O2 at 2 l/min.    Exercises     PT Diagnosis: Difficulty walking  PT Problem List: Decreased strength;Decreased activity tolerance;Decreased mobility;Cardiopulmonary status limiting activity PT Treatment Interventions: DME instruction;Gait training;Functional mobility training;Therapeutic exercise;Patient/family education     PT Goals(Current goals can be found in the care plan section) Acute Rehab PT Goals Patient Stated Goal: To go home soon PT Goal Formulation: With patient Time For Goal Achievement: 10/03/13 Potential to Achieve Goals: Good  Visit Information  Last PT Received On: 09/26/13 Assistance Needed: +1 History of Present Illness: 80 year old female with PMH of HTN and DM. Recent discharged from [facility] for PE. Presents with SOB and hypoxia 3/20.  Patient with acute resp failure, heart failure, pulm edema.  Patient has pacemaker.       Prior [place] expects to be discharged to:: Private residence Living Arrangements: Children Available Help at Discharge: Family;Available 24 hours/day (Daughter) Type of Home: House Home Access: Level entry Home Layout: One level Home Equipment: Walker - 4 wheels;Shower seat - built in;Cane - single point Prior Function Level of Independence: Independent with assistive device(s);Needs assistance Gait / Transfers Assistance Needed: Independent with rollator ADL's / Homemaking Assistance Needed: Daughter prepares meals. Communication Communication: No difficulties    Cognition  Cognition Arousal/Alertness: Awake/alert Behavior During Therapy: WFL for tasks assessed/performed Overall Cognitive Status: Within Functional Limits for  tasks assessed    Extremity/Trunk Assessment Upper Extremity Assessment Upper Extremity  Assessment: Overall WFL for tasks assessed Lower Extremity Assessment Lower Extremity Assessment: Generalized weakness   Balance    End of Session PT - End of Session Equipment Utilized During Treatment: Gait belt;Oxygen Activity Tolerance: Patient limited by fatigue Patient left: in chair;with call bell/phone within reach Nurse Communication: Mobility status  GP     Despina Pole 09/26/2013, 9:48 AM  Carita Pian. Sanjuana Kava, [place] Pager [phone]

## 2013-09-26 NOTE — Progress Notes (Signed)
[facility] PROGRESS NOTE Interim History: 80 year old female with PMH of HTN and DM. Recent discharged from [facility] for PE. Presents with SOB and hypoxia 3/20. PCCM asked to admit  [facility] Weights   09/24/13 1536 09/25/13 0424 09/26/13 0548  Weight: 100.472 kg (221 lb 8 oz) 101.243 kg (223 lb 3.2 oz) 95.21 kg (209 lb 14.4 oz)        Intake/Output Summary (Last 24 hours) at 09/26/13 0801 Last data filed at 09/26/13 0510  Gross per 24 hour  Intake 927.34 ml  Output   5700 ml  Net -4772.66 ml     Assessment/Plan: Acute respiratory failure due to acute on chronic diastolic heart failure/Pulmonary edema: - Requiring bipap on admission. Now off bipap. Still fluid overloaded. - Off NTG. Start lasix ggt plus metolazone. - Ted hose, restrict Fluids, low sodium diet, strict I and O's. - Monitor electrolytes replete as needed. - 103.6->101->95.2 kg.  Pulmonary embolism: - heparin IV and coumadin per pharmacy.  Diabetes mellitus: - cont SSI , add 70/30. - HbgA1c 6.6. Will need further titration of meds as an outpatient.     Code Status: full Family Communication: none  Disposition Plan: inpatient   Consultants:  PCCM  Procedures: ECHO: pending  Antibiotics:  None  HPI/Subjective: No complains.  Objective: Filed Vitals:   09/25/13 2218 09/26/13 0042 09/26/13 0226 09/26/13 0548  BP: 147/53 141/43  144/53  Pulse: 72 77  77  Temp: 98.4 F (36.9 C) 98 F (36.7 C)  98.1 F (36.7 C)  TempSrc: Oral Oral  Oral  Resp: 19 18  17   Height:      Weight:    95.21 kg (209 lb 14.4 oz)  SpO2: 98% 96% 98% 97%     Exam:  General: Alert, awake, oriented x3, in no acute distress.  HEENT: No bruits, no goiter. +JVD Heart: Regular rate and rhythm, without murmurs, rubs, gallops.  Lungs: Good air movement, clear. Abdomen: Soft, nontender, nondistended, positive bowel sounds.  Neuro: Grossly intact, nonfocal.   Data Reviewed: Basic Metabolic Panel:  Recent  Labs Lab 09/23/13 1404 09/23/13 1426 09/23/13 1856 09/24/13 0230 09/25/13 0520  NA 136* 136* 136* 139 139  K 4.9 4.7 4.9 4.0 3.8  CL 93* 93* 92* 95* 94*  CO2 31  --  32 34* 34*  GLUCOSE 282* 283* 289* 91 138*  BUN 16 19 18 18 16   CREATININE 0.85 1.00 0.85 0.97 0.91  CALCIUM 9.1  --  9.0 8.9 9.0  MG  --   --  2.1 2.0  --   PHOS  --   --  4.0 4.1  --    Liver Function Tests:  Recent Labs Lab 09/23/13 1404 09/23/13 1856  AST 34 32  ALT 20 22  ALKPHOS 92 86  BILITOT 0.5 0.5  PROT 7.7 7.2  ALBUMIN 3.2* 3.0*   No results found for this basename: LIPASE, AMYLASE,  in the last 168 hours No results found for this basename: AMMONIA,  in the last 168 hours CBC:  Recent Labs Lab 09/23/13 1404 09/23/13 1426 09/23/13 1856 09/24/13 0230 09/25/13 0520  WBC 13.4*  --  10.2 9.4 7.8  NEUTROABS 11.4*  --  8.7*  --   --   HGB 9.8* 10.9* 8.6* 8.6* 9.1*  HCT 31.0* 32.0* 27.5* 27.5* 29.3*  MCV 88.3  --  87.9 88.4 89.1  PLT 293  --  266 260 275   Cardiac Enzymes: No results found for this  basename: CKTOTAL, CKMB, CKMBINDEX, TROPONINI,  in the last 168 hours BNP (last 3 results)  Recent Labs  09/23/13 1404 09/23/13 1856  PROBNP 2533.0* 2961.0*   CBG:  Recent Labs Lab 09/25/13 0607 09/25/13 1159 09/25/13 1559 09/25/13 2154 09/26/13 0620  GLUCAP 158* 162* 177* 165* 224*    Recent Results (from the past 240 hour(s))  URINE CULTURE     Status: None   Collection Time    09/23/13  5:54 PM      Result Value Ref Range Status   Specimen Description URINE, CATHETERIZED   Final   Special Requests NONE   Final   Culture  Setup Time     Final   Value: 09/23/2013 18:54     Performed at [place]     Final   Value: NO GROWTH     Performed at [facility]   Culture     Final   Value: NO GROWTH     Performed at [facility]   Report Status 09/24/2013 FINAL   Final  MRSA PCR SCREENING     Status: None   Collection Time     09/23/13  6:15 PM      Result Value Ref Range Status   MRSA by PCR NEGATIVE  NEGATIVE Final   Comment:            The GeneXpert MRSA Assay (FDA     approved for NASAL specimens     only), is one component of a     comprehensive MRSA colonization     surveillance program. It is not     intended to diagnose MRSA     infection nor to guide or     monitor treatment for     MRSA infections.  CULTURE, BLOOD (ROUTINE X 2)     Status: None   Collection Time    09/23/13  6:56 PM      Result Value Ref Range Status   Specimen Description BLOOD LEFT THUMB LEFT   Final   Special Requests BOTTLES DRAWN AEROBIC ONLY 8CC   Final   Culture  Setup Time     Final   Value: 09/24/2013 00:43     Performed at [facility]   Culture     Final   Value:        BLOOD CULTURE RECEIVED NO GROWTH TO DATE CULTURE WILL BE HELD FOR 5 DAYS BEFORE ISSUING A FINAL NEGATIVE REPORT     Performed at [facility]   Report Status PENDING   Incomplete  CULTURE, BLOOD (ROUTINE X 2)     Status: None   Collection Time    09/23/13  7:12 PM      Result Value Ref Range Status   Specimen Description BLOOD RIGHT HAND   Final   Special Requests BOTTLES DRAWN AEROBIC ONLY 10CC   Final   Culture  Setup Time     Final   Value: 09/24/2013 00:44     Performed at [facility]   Culture     Final   Value:        BLOOD CULTURE RECEIVED NO GROWTH TO DATE CULTURE WILL BE HELD FOR 5 DAYS BEFORE ISSUING A FINAL NEGATIVE REPORT     Performed at [facility]   Report Status PENDING   Incomplete     Studies: No results found.  Scheduled Meds: . aspirin  81 mg Oral Daily  .  carvedilol  12.5 mg Oral BID WC  . hydrALAZINE  25 mg Oral TID  . insulin aspart  0-15 Units Subcutaneous TID WC  . insulin aspart  0-5 Units Subcutaneous QHS  . insulin aspart  3 Units Subcutaneous TID WC  . insulin aspart protamine- aspart  15 Units Subcutaneous Q supper  . insulin aspart protamine- aspart  30 Units  Subcutaneous Q breakfast  . ipratropium-albuterol  3 mL Nebulization Q4H  . pantoprazole  40 mg Oral Daily  . Warfarin - Pharmacist Dosing Inpatient   Does not apply q1800   Continuous Infusions: . furosemide (LASIX) infusion 8 mg/hr (09/25/13 1034)     Venetia Constable Marguarite Arbour  [facility] Pager [phone] If 8PM-8AM, please contact night-coverage at [email or web address], password [email or web address] 09/26/2013, 8:01 AM  LOS: 3 days

## 2013-09-26 NOTE — Progress Notes (Signed)
1600 report received from [place] . Pt seating at bedside asleep . Respiration easy and regular . Lasix infusion at 8 mgs /hr in progress

## 2013-09-26 NOTE — Discharge Instructions (Signed)

## 2013-09-26 NOTE — Progress Notes (Signed)
ANTICOAGULATION CONSULT NOTE - Follow Up Consult  Pharmacy Consult for Coumadin Indication: pulmonary embolus  No Known Allergies  Labs:  Recent Labs  09/23/13 1426 09/23/13 1856 09/23/13 2326 09/24/13 0230 09/24/13 0950 09/25/13 0520 09/26/13 0510  HGB 10.9* 8.6*  --  8.6*  --  9.1*  --   HCT 32.0* 27.5*  --  27.5*  --  29.3*  --   PLT  --  266  --  260  --  275  --   APTT  --  39*  --   --   --   --   --   LABPROT  --  21.3*  --  24.0*  --  23.5* 22.0*  INR  --  1.91*  --  2.23*  --  2.17* 1.99*  HEPARINUNFRC  --   --  0.30  --  0.15*  --   --   CREATININE 1.00 0.85  --  0.97  --  0.91  --    Assessment: 80 yo F on Coumadin PTA for recent PE diagnosed at [facility]. INR on admission was subtherapeutic and pt was started on heparin.  INR just out of therapeutic range at 1.99 with slight decrease today.  CTA neg PE.   Home Coumadin dose of 4 mg daily. Home Coumadin regimen later confirmed with daughter, Noe Gens. Will give extra warfarin today. No bleeding issues noted.   Goal of Therapy:  INR 2-3 Monitor platelets by anticoagulation protocol: Yes   Plan:  Repeat Coumadin 6 mg tonight.   Follow INR trend.   Daily INR  Thank you, Erin Hearing PharmD., BCPS Clinical Pharmacist Pager [phone] 09/26/2013 10:48 AM

## 2013-09-27 DIAGNOSIS — E872 Acidosis: Secondary | ICD-10-CM | POA: Diagnosis not present

## 2013-09-27 DIAGNOSIS — I2699 Other pulmonary embolism without acute cor pulmonale: Secondary | ICD-10-CM | POA: Diagnosis not present

## 2013-09-27 DIAGNOSIS — J9600 Acute respiratory failure, unspecified whether with hypoxia or hypercapnia: Secondary | ICD-10-CM | POA: Diagnosis not present

## 2013-09-27 DIAGNOSIS — I5031 Acute diastolic (congestive) heart failure: Secondary | ICD-10-CM | POA: Diagnosis not present

## 2013-09-27 LAB — GLUCOSE, CAPILLARY
GLUCOSE-CAPILLARY: 126 mg/dL — AB (ref 70–99)
Glucose-Capillary: 189 mg/dL — ABNORMAL HIGH (ref 70–99)
Glucose-Capillary: 230 mg/dL — ABNORMAL HIGH (ref 70–99)

## 2013-09-27 LAB — PROTIME-INR
INR: 1.82 — AB (ref 0.00–1.49)
PROTHROMBIN TIME: 20.5 s — AB (ref 11.6–15.2)

## 2013-09-27 LAB — BASIC METABOLIC PANEL
BUN: 25 mg/dL — AB (ref 6–23)
CO2: 35 mEq/L — ABNORMAL HIGH (ref 19–32)
CREATININE: 1.23 mg/dL — AB (ref 0.50–1.10)
Calcium: 9.6 mg/dL (ref 8.4–10.5)
Chloride: 87 mEq/L — ABNORMAL LOW (ref 96–112)
GFR, EST AFRICAN AMERICAN: 46 mL/min — AB (ref >90)
GFR, EST NON AFRICAN AMERICAN: 40 mL/min — AB (ref >90)
Glucose, Bld: 127 mg/dL — ABNORMAL HIGH (ref 70–99)
POTASSIUM: 4.2 meq/L (ref 3.7–5.3)
Sodium: 138 mEq/L (ref 137–147)

## 2013-09-27 MED ORDER — FUROSEMIDE 40 MG PO TABS
40.0000 mg | ORAL_TABLET | Freq: Every day | ORAL | Status: DC
  Administered 2013-09-27: 40 mg via ORAL
  Filled 2013-09-27 (×2): qty 1

## 2013-09-27 MED ORDER — WARFARIN SODIUM 7.5 MG PO TABS
7.5000 mg | ORAL_TABLET | Freq: Once | ORAL | Status: AC
  Administered 2013-09-27: 7.5 mg via ORAL
  Filled 2013-09-27: qty 1

## 2013-09-27 NOTE — Care Management Note (Addendum)
  Page 2 of 2   09/30/2013     2:11:11 PM   CARE MANAGEMENT NOTE 09/30/2013  Patient:  Kimberly Peterson, Kimberly Peterson   Account Number:  [id number]  Date Initiated:  09/27/2013  Documentation initiated by:  S[place]  Subjective/Objective Assessment:   Admitted with acute Resp failure and HF     Action/Plan:   CM to follow for disposition needs   Anticipated DC Date:  09/28/2013   Anticipated DC Plan:  [place]  CM consult      [facility] Choice  HOME HEALTH   Choice offered to / List presented to:  C-1 Patient        [place] arranged  HH-1 RN  [place]   Status of service:  Completed, signed off Medicare Important Message given?   (If response is "NO", the following Medicare IM given date fields will be blank) Date Medicare IM given:   Date Additional Medicare IM given:    Discharge Disposition:  [place]  Per UR Regulation:  Reviewed for med. necessity/level of care/duration of stay  If discussed at [place] of Stay Meetings, dates discussed:   09/29/2013    Comments:  09/30/2013 Post D/c call received from [facility] stating confirmation that patient is active with [facility]. HHS order, d/c summary and AVS faxed to [facility]: Attention: Malachy Mood at [place] RN, BSN, MSHL, CCM 09/30/2013  09/27/2013 IV Lasix gtt d/c this am following 8am dose and Po Lasix ordered. Social:  From home; DTR lives with patient. Transportation:  DTR Medications:  Self managed Home DME:  walker, home O2 IDT:  States hx/o past services with [facility] but unable to identify what services. PT Recs:  PT Provider Election:  [facility] Disposition PLan: HHS:  PT/OT (Learned notified / Butch Penny) Shekia Kuper RN, BSN, [place], CCM 09/27/2013

## 2013-09-27 NOTE — Progress Notes (Signed)
ANTICOAGULATION CONSULT NOTE - Follow Up Consult  Pharmacy Consult for Coumadin Indication: pulmonary embolus  No Known Allergies  Labs:  Recent Labs  09/25/13 0520 09/26/13 0510 09/26/13 0926 09/27/13 0340  HGB 9.1*  --   --   --   HCT 29.3*  --   --   --   PLT 275  --   --   --   LABPROT 23.5* 22.0*  --  20.5*  INR 2.17* 1.99*  --  1.82*  CREATININE 0.91  --  1.08 1.23*   Assessment: SOB 80 yo F recently admitted to [facility] for PE and started on Coumadin. Presents to [facility] ED 3/20 with SOB and hypoxia.  PMH: HTN, DM, PE, pancreatitis  AC: Heparin/Coumadin for recent PE (PTA Coumadin 4mg /d) - INR 1.86 on admit. Transitioned off heparin since repeat CT neg for PE. INR 1.82 down again. Discussed with MD. Willa Frater?  ID: none; follow-up cx; Afeb, WBC Wnl 3/20 blood x 2-ngtd 3/20 urine -ng  CV: HTN, concern for AonCHF with elevated BNP. 112/51, HR 79 - Meds: ASA81,Coreg, Lasix, hydralazine. f/u restart ACEi - ECHO ef 60%. Plan to start Lasix drip and Zaroxyln  Endo: DM A1c 6.6: CBG elev on SSI 122-189, now with meal time and 70/30 mix  GI/Nutr: PPI  Renal: Scr up to 1.23. Good UOP.  Pulm: Resp much improved; though 2/2 pulm edema, off bipap  Heme/Onc: Hgb 9.1  Best practices: Coumadin    Goal of Therapy:  INR 2-3 Monitor platelets by anticoagulation protocol: Yes   Plan:  Coumadin 7.5mg  po x 1 tonight.  Lyndsie Wallman S. Alford Highland, PharmD, [facility] Clinical Staff Pharmacist Pager [phone]  09/27/2013 12:43 PM

## 2013-09-27 NOTE — Progress Notes (Signed)
UR completed Emmarie Sannes K. Chasitee Zenker, RN, BSN, MSHL, CCM  09/27/2013 11:18 AM

## 2013-09-27 NOTE — Progress Notes (Addendum)
[facility] PROGRESS NOTE Interim History: 80 year old female with PMH of HTN and DM. Recent discharged from [facility] for PE. Presents with SOB and hypoxia 3/20. PCCM asked to admit  [facility] Weights   09/25/13 0424 09/26/13 0548 09/27/13 0651  Weight: 101.243 kg (223 lb 3.2 oz) 95.21 kg (209 lb 14.4 oz) 93.577 kg (206 lb 4.8 oz)        Intake/Output Summary (Last 24 hours) at 09/27/13 6599 Last data filed at 09/27/13 3570  Gross per 24 hour  Intake    600 ml  Output   3325 ml  Net  -2725 ml     Assessment/Plan: Acute respiratory failure due to acute on chronic diastolic heart failure/Pulmonary edema: - Requiring bipap on admission. Now off bipap. Still fluid overloaded. - Off NTG. Start lasix ggt and single dose of metolazone cont to have Good UOP. - Change to oral lasix, b-met in am. - Ted hose, restrict Fluids, low sodium diet, strict I and O's. - Monitor electrolytes replete as needed. - 103.6->101->95.2 ->93.5 kg. - off diltiazem. Unkown estimated dry weight,.  Pulmonary embolism: - cont coumadin. - will go home on 5 mg for home.  Diabetes mellitus: - cont SSI , add 70/30. - HbgA1c 6.6. Will need further titration of meds as an outpatient.     Code Status: full Family Communication: none  Disposition Plan: inpatient   Consultants:  PCCM  Procedures: ECHO: pending  Antibiotics:  None  HPI/Subjective: No complains.  Objective: Filed Vitals:   09/26/13 1454 09/26/13 1933 09/26/13 2100 09/27/13 0651  BP: 121/54  134/51 126/48  Pulse: 77  77 77  Temp: 98 F (36.7 C)  98.8 F (37.1 C) 98.5 F (36.9 C)  TempSrc: Oral  Oral Oral  Resp: _0 Height:      Weight:    93.577 kg (206 lb 4.8 oz)  SpO2: 97% 99% 94% 99%     Exam:  General: Alert, awake, oriented x3, in no acute distress.  HEENT: No bruits, no goiter. - JVD Heart: Regular rate and rhythm, without murmurs, rubs, gallops.  Lungs: Good air movement, clear. Abdomen: Soft,  nontender, nondistended, positive bowel sounds.    Data Reviewed: Basic Metabolic Panel:  Recent Labs Lab 09/23/13 1426 09/23/13 1856 09/24/13 0230 09/25/13 0520 09/26/13 0926 09/27/13 0340  NA 136* 136* 139 139 139 138  K 4.7 4.9 4.0 3.8 3.5* 4.2  CL 93* 92* 95* 94* 87* 87*  CO2  --  32 34* 34* 35* 35*  GLUCOSE 283* 289* 91 138* 177* 127*  BUN _1 25*  CREATININE 1.00 0.85 0.97 0.91 1.08 1.23*  CALCIUM  --  9.0 8.9 9.0 9.9 9.6  MG  --  2.1 2.0  --   --   --   PHOS  --  4.0 4.1  --   --   --    Liver Function Tests:  Recent Labs Lab 09/23/13 1404 09/23/13 1856  AST 34 32  ALT 20 22  ALKPHOS 92 86  BILITOT 0.5 0.5  PROT 7.7 7.2  ALBUMIN 3.2* 3.0*   No results found for this basename: LIPASE, AMYLASE,  in the last 168 hours No results found for this basename: AMMONIA,  in the last 168 hours CBC:  Recent Labs Lab 09/23/13 1404 09/23/13 1426 09/23/13 1856 09/24/13 0230 09/25/13 0520  WBC 13.4*  --  10.2 9.4 7.8  NEUTROABS 11.4*  --  8.7*  --   --  HGB 9.8* 10.9* 8.6* 8.6* 9.1*  HCT 31.0* 32.0* 27.5* 27.5* 29.3*  MCV 88.3  --  87.9 88.4 89.1  PLT 293  --  266 260 275   Cardiac Enzymes: No results found for this basename: CKTOTAL, CKMB, CKMBINDEX, TROPONINI,  in the last 168 hours BNP (last 3 results)  Recent Labs  09/23/13 1404 09/23/13 1856  PROBNP 2533.0* 2961.0*   CBG:  Recent Labs Lab 09/26/13 0620 09/26/13 1043 09/26/13 1713 09/26/13 2125 09/27/13 0623  GLUCAP 224* 157* 122* 136* 126*    Recent Results (from the past 240 hour(s))  URINE CULTURE     Status: None   Collection Time    09/23/13  5:54 PM      Result Value Ref Range Status   Specimen Description URINE, CATHETERIZED   Final   Special Requests NONE   Final   Culture  Setup Time     Final   Value: 09/23/2013 18:54     Performed at [facility]   Colony Count     Final   Value: NO GROWTH     Performed at [facility]   Culture     Final    Value: NO GROWTH     Performed at [facility]   Report Status 09/24/2013 FINAL   Final  MRSA PCR SCREENING     Status: None   Collection Time    09/23/13  6:15 PM      Result Value Ref Range Status   MRSA by PCR NEGATIVE  NEGATIVE Final   Comment:            The GeneXpert MRSA Assay (FDA     approved for NASAL specimens     only), is one component of a     comprehensive MRSA colonization     surveillance program. It is not     intended to diagnose MRSA     infection nor to guide or     monitor treatment for     MRSA infections.  CULTURE, BLOOD (ROUTINE X 2)     Status: None   Collection Time    09/23/13  6:56 PM      Result Value Ref Range Status   Specimen Description BLOOD LEFT THUMB LEFT   Final   Special Requests BOTTLES DRAWN AEROBIC ONLY 8CC   Final   Culture  Setup Time     Final   Value: 09/24/2013 00:43     Performed at [facility]   Culture     Final   Value:        BLOOD CULTURE RECEIVED NO GROWTH TO DATE CULTURE WILL BE HELD FOR 5 DAYS BEFORE ISSUING A FINAL NEGATIVE REPORT     Performed at [facility]   Report Status PENDING   Incomplete  CULTURE, BLOOD (ROUTINE X 2)     Status: None   Collection Time    09/23/13  7:12 PM      Result Value Ref Range Status   Specimen Description BLOOD RIGHT HAND   Final   Special Requests BOTTLES DRAWN AEROBIC ONLY 10CC   Final   Culture  Setup Time     Final   Value: 09/24/2013 00:44     Performed at [facility]   Culture     Final   Value:        BLOOD CULTURE RECEIVED NO GROWTH TO DATE CULTURE WILL BE HELD FOR 5 DAYS BEFORE   ISSUING A FINAL NEGATIVE REPORT     Performed at [facility]   Report Status PENDING   Incomplete     Studies: No results found.  Scheduled Meds: . aspirin  81 mg Oral Daily  . carvedilol  12.5 mg Oral BID WC  . hydrALAZINE  25 mg Oral TID  . insulin aspart  0-15 Units Subcutaneous TID WC  . insulin aspart  0-5 Units Subcutaneous QHS  . insulin  aspart  3 Units Subcutaneous TID WC  . insulin aspart protamine- aspart  15 Units Subcutaneous Q supper  . insulin aspart protamine- aspart  30 Units Subcutaneous Q breakfast  . ipratropium-albuterol  3 mL Nebulization TID  . pantoprazole  40 mg Oral Daily  . Warfarin - Pharmacist Dosing Inpatient   Does not apply q1800   Continuous Infusions: . furosemide (LASIX) infusion 8 mg/hr (09/26/13 2121)     Kimberly Peterson, Kimberly Peterson  [facility] Pager [phone] If 8PM-8AM, please contact night-coverage at [email or web address], password [email or web address] 09/27/2013, 8:17 AM  LOS: 4 days        

## 2013-09-27 NOTE — Progress Notes (Signed)
Patient alert oriented, no c/o pain, no  Shortness, tolerate diet, v/s stable. Will continue to monitor patient

## 2013-09-28 DIAGNOSIS — N179 Acute kidney failure, unspecified: Secondary | ICD-10-CM

## 2013-09-28 DIAGNOSIS — I5031 Acute diastolic (congestive) heart failure: Secondary | ICD-10-CM | POA: Diagnosis not present

## 2013-09-28 DIAGNOSIS — J9600 Acute respiratory failure, unspecified whether with hypoxia or hypercapnia: Secondary | ICD-10-CM | POA: Diagnosis not present

## 2013-09-28 DIAGNOSIS — E872 Acidosis: Secondary | ICD-10-CM | POA: Diagnosis not present

## 2013-09-28 DIAGNOSIS — I2699 Other pulmonary embolism without acute cor pulmonale: Secondary | ICD-10-CM | POA: Diagnosis not present

## 2013-09-28 LAB — PROTIME-INR
INR: 2.04 — ABNORMAL HIGH (ref 0.00–1.49)
PROTHROMBIN TIME: 22.4 s — AB (ref 11.6–15.2)

## 2013-09-28 LAB — BASIC METABOLIC PANEL
BUN: 34 mg/dL — ABNORMAL HIGH (ref 6–23)
CO2: 36 mEq/L — ABNORMAL HIGH (ref 19–32)
Calcium: 9.6 mg/dL (ref 8.4–10.5)
Chloride: 87 mEq/L — ABNORMAL LOW (ref 96–112)
Creatinine, Ser: 1.42 mg/dL — ABNORMAL HIGH (ref 0.50–1.10)
GFR calc non Af Amer: 34 mL/min — ABNORMAL LOW (ref >90)
GFR, EST AFRICAN AMERICAN: 39 mL/min — AB (ref >90)
Glucose, Bld: 231 mg/dL — ABNORMAL HIGH (ref 70–99)
POTASSIUM: 4.3 meq/L (ref 3.7–5.3)
SODIUM: 137 meq/L (ref 137–147)

## 2013-09-28 LAB — GLUCOSE, CAPILLARY
GLUCOSE-CAPILLARY: 308 mg/dL — AB (ref 70–99)
GLUCOSE-CAPILLARY: 212 mg/dL — AB (ref 70–99)
Glucose-Capillary: 303 mg/dL — ABNORMAL HIGH (ref 70–99)
Glucose-Capillary: 149 mg/dL — ABNORMAL HIGH (ref 70–99)
Glucose-Capillary: 76 mg/dL (ref 70–99)

## 2013-09-28 MED ORDER — HYDROCODONE-ACETAMINOPHEN 5-325 MG PO TABS
1.0000 | ORAL_TABLET | ORAL | Status: DC | PRN
  Administered 2013-09-28: 1 via ORAL
  Filled 2013-09-28: qty 1

## 2013-09-28 MED ORDER — SODIUM CHLORIDE 0.9 % IV SOLN: INTRAVENOUS | Status: DC

## 2013-09-28 MED ORDER — WARFARIN SODIUM 7.5 MG PO TABS
7.5000 mg | ORAL_TABLET | Freq: Once | ORAL | Status: AC
  Administered 2013-09-28: 7.5 mg via ORAL
  Filled 2013-09-28: qty 1

## 2013-09-28 MED ORDER — SODIUM CHLORIDE 0.9 % IV SOLN: INTRAVENOUS | Status: AC

## 2013-09-28 NOTE — Progress Notes (Signed)
ANTICOAGULATION CONSULT NOTE - Follow Up Consult  Pharmacy Consult for Coumadin Indication: pulmonary embolus  No Known Allergies  Labs:  Recent Labs  09/26/13 0510 09/26/13 0926 09/27/13 0340 09/28/13 0520  LABPROT 22.0*  --  20.5* 22.4*  INR 1.99*  --  1.82* 2.04*  CREATININE  --  1.08 1.23* 1.42*   Assessment: SOB 80 yo F recently admitted to [facility] for PE and started on Coumadin. Presents to [facility] ED 3/20 with SOB and hypoxia.  PMH: HTN, DM, PE, pancreatitis  AC: Heparin/Coumadin for recent PE (PTA Coumadin 4mg /d) - Admit INR 1.86 on. Transitioned off heparin since repeat CT neg for PE. INR 2.04  ID: none; follow-up cx; Afeb, WBC Wnl 3/20 blood x 2-ngtd 3/20 urine -Negative  CV: HTN, concern for AoCHF with elevated BNP. VSS -  Meds: ASA81,Coreg, Lasix, hydralazine. f/u restart ACEi - ECHO ef 60%.  Endo: DM A1c 6.6: CBG elev on SSI 126-230, now with meal time and 70/30 mix  GI/Nutr: PPI  Renal: Scr up to 1.42. UOP down to 0.3 with U/O+199.  Pulm: Resp improved; though 2/2 pulm edema, off bipap. 100% on 2L  Heme/Onc: Hgb 9.1  Best practices: Coumadin    Goal of Therapy:  INR 2-3 Monitor platelets by anticoagulation protocol: Yes   Plan:  Coumadin 7.5mg  po x 1 tonight again Recommend discharge on 5mg  daily  Maeve Debord S. Alford Highland, PharmD, [facility] Clinical Staff Pharmacist Pager [phone]  09/28/2013 10:53 AM

## 2013-09-28 NOTE — Progress Notes (Signed)
[facility]                                                                              Patient Demographics  Kimberly Peterson, is a 80 y.o. female, DOB - [birth year], [id number]  Admit date - 09/23/2013   Admitting Physician Rush Farmer, MD  Outpatient Primary MD for the patient is [name], MD  LOS - 5   Chief Complaint  Patient presents with  . Shortness of Breath        Assessment & Plan   Acute respiratory failure due to acute on chronic diastolic heart failure/Pulmonary edema:  -Required bipap on admission. Now off bipap. Still fluid overloaded.  -Off NTG. Was on lasix ggt and received on dose of metolazone -Continue daily weights, strict I's and O's, and fluid restriction -Has lost 10kg since admission  Acute kidney injury -Likely secondary to diuresis -Cr 1.42, baseline <1 -Will discontinue lasix and start NS at 75cc/hr and continue to monitor Cr  Pulmonary embolism:  -continue coumadin (home dose 5mg )  Diabetes mellitus:  -contnue ISS and 70/30, CBG monitoring -HbgA1c 6.6.  -Will need further titration of meds as an outpatient.  Code Status: Full  Family Communication: None at bedside  Disposition Plan: Admitted, will discharge once Creatinine improves  Time Spent in minutes   35 minutes  Procedures  Echocardiogram Study Conclusions - Left ventricle: The cavity size was normal. Wall thickness was increased in a pattern of mild LVH. There was focal basal hypertrophy. Systolic function was normal. The estimated ejection fraction was in the range of 60% to 65%. Wall motion was normal; there were no regional wall motion abnormalities. - Mitral valve: Severely calcified annulus. The findings are consistent with mild stenosis. - Left atrium: The atrium was mildly dilated.  - Right atrium: The atrium was mildly dilated. - Pulmonary arteries: Systolic pressure was moderately to severely increased. PA peak pressure: 39mm Hg (S).  Consults   PCCM  DVT Prophylaxis Warfarin  Lab Results  Component Value Date   PLT 275 09/25/2013    Medications  Scheduled Meds: . aspirin  81 mg Oral Daily  . carvedilol  12.5 mg Oral BID WC  . furosemide  40 mg Oral Daily  . hydrALAZINE  25 mg Oral TID  . insulin aspart  0-15 Units Subcutaneous TID WC  . insulin aspart  0-5 Units Subcutaneous QHS  . insulin aspart  3 Units Subcutaneous TID WC  . insulin aspart protamine- aspart  15 Units Subcutaneous Q supper  . insulin aspart protamine- aspart  30 Units Subcutaneous Q breakfast  . ipratropium-albuterol  3 mL Nebulization TID  . pantoprazole  40 mg Oral Daily  . Warfarin - Pharmacist Dosing Inpatient   Does not apply q1800   Continuous Infusions:  PRN Meds:.HYDROcodone-acetaminophen  Antibiotics    Anti-infectives   None        Subjective:   Kimberly Peterson seen and examined today.  Patient states "I'm better."  She complains of a spasm on her left side, however it has resolved after getting medicine overnight.  She has no other complaints this morning.  Objective:   Filed Vitals:   09/27/13 2122  09/27/13 2132 09/28/13 0500 09/28/13 0656  BP: 137/51  123/57   Pulse: 75  72   Temp: 98.2 F (36.8 C)  97.3 F (36.3 C)   TempSrc: Oral  Oral   Resp: 18  18   Height:      Weight:    94.484 kg (208 lb 4.8 oz)  SpO2: 98% 97% 100%     Wt Readings from Last 3 Encounters:  09/28/13 94.484 kg (208 lb 4.8 oz)  05/09/13 101.152 kg (223 lb)     Intake/Output Summary (Last 24 hours) at 09/28/13 0835 Last data filed at 09/28/13 0824  Gross per 24 hour  Intake   1140 ml  Output    701 ml  Net    439 ml    Exam  General: Well developed, well nourished, NAD, appears stated age  HEENT: NCAT, PERRLA, EOMI, Anicteic Sclera, mucous membranes moist.   Neck: Supple, no JVD, no masses  Cardiovascular: S1 S2 auscultated, no rubs, murmurs or gallops. Regular rate and rhythm.  Respiratory: Clear to auscultation bilaterally with  equal chest rise  Abdomen: Soft, nontender, nondistended, + bowel sounds  Extremities: warm dry without cyanosis clubbing or edema  Neuro: AAOx3, cranial nerves grossly intact. Strength 5/5 in patient's upper and lower extremities bilaterally  Skin: Without rashes exudates or nodules  Psych: Normal affect and demeanor with intact judgement and insight  Data Review   Micro Results Recent Results (from the past 240 hour(s))  URINE CULTURE     Status: None   Collection Time    09/23/13  5:54 PM      Result Value Ref Range Status   Specimen Description URINE, CATHETERIZED   Final   Special Requests NONE   Final   Culture  Setup Time     Final   Value: 09/23/2013 18:54     Performed at [place]     Final   Value: NO GROWTH     Performed at [facility]   Culture     Final   Value: NO GROWTH     Performed at [facility]   Report Status 09/24/2013 FINAL   Final  MRSA PCR SCREENING     Status: None   Collection Time    09/23/13  6:15 PM      Result Value Ref Range Status   MRSA by PCR NEGATIVE  NEGATIVE Final   Comment:            The GeneXpert MRSA Assay (FDA     approved for NASAL specimens     only), is one component of a     comprehensive MRSA colonization     surveillance program. It is not     intended to diagnose MRSA     infection nor to guide or     monitor treatment for     MRSA infections.  CULTURE, BLOOD (ROUTINE X 2)     Status: None   Collection Time    09/23/13  6:56 PM      Result Value Ref Range Status   Specimen Description BLOOD LEFT THUMB LEFT   Final   Special Requests BOTTLES DRAWN AEROBIC ONLY 8CC   Final   Culture  Setup Time     Final   Value: 09/24/2013 00:43     Performed at [facility]   Culture     Final   Value:  BLOOD CULTURE RECEIVED NO GROWTH TO DATE CULTURE WILL BE HELD FOR 5 DAYS BEFORE ISSUING A FINAL NEGATIVE REPORT     Performed at [facility]   Report Status  PENDING   Incomplete  CULTURE, BLOOD (ROUTINE X 2)     Status: None   Collection Time    09/23/13  7:12 PM      Result Value Ref Range Status   Specimen Description BLOOD RIGHT HAND   Final   Special Requests BOTTLES DRAWN AEROBIC ONLY 10CC   Final   Culture  Setup Time     Final   Value: 09/24/2013 00:44     Performed at [facility]   Culture     Final   Value:        BLOOD CULTURE RECEIVED NO GROWTH TO DATE CULTURE WILL BE HELD FOR 5 DAYS BEFORE ISSUING A FINAL NEGATIVE REPORT     Performed at [facility]   Report Status PENDING   Incomplete    Radiology Reports Ct Angio Chest Pe W/cm &/or Wo Cm  09/23/2013   CLINICAL DATA:  Shortness of breath, history of pulmonary embolism, hypertension, diabetes, evaluate for pulmonary embolism.  EXAM: CT ANGIOGRAPHY CHEST WITH CONTRAST  TECHNIQUE: Multidetector CT imaging of the chest was performed using the standard protocol during bolus administration of intravenous contrast. Multiplanar CT image reconstructions and MIPs were obtained to evaluate the vascular anatomy.  CONTRAST:  97mL OMNIPAQUE IOHEXOL 350 MG/ML SOLN  COMPARISON:  DG CHEST 1V PORT dated 09/23/2013  FINDINGS: Vascular Findings:  There is adequate opacification of the pulmonary arterial system with the main pulmonary artery measuring 296 Hounsfield units. There are no discrete filling defects within the pulmonary arterial tree to the level of the bilateral subsegmental pulmonary arteries. Evaluation of distal subsegmental pulmonary arteries is degraded secondary to a combination of suboptimal vessel opacification and patient body habitus. Borderline enlarged caliber the main pulmonary artery measuring 32 mm in diameter.  Post median sternotomy. Coronary artery calcifications. Calcifications within the mitral valve annulus. Left anterior chest wall dual lead pacemaker with lead tips terminating within the right atrium and ventricle. No pericardial effusion. Scattered  atherosclerotic plaque within a normal caliber thoracic aorta.  Review of the MIP images confirms the above findings.   ----------------------------------------------------------------------------------  Nonvascular Findings:  Examination is markedly degraded secondary to patient body habitus.  Small bilateral pleural effusions with associated dependent bibasilar opacities, right greater left. No definitive air bronchograms. No pneumothorax. There is mild interlobular septal thickening most conspicuous within the bilateral upper lungs. There is collapse of the right and left mainstem bronchi as well as multiple segmental bronchi. No pneumothorax.  Mediastinal and hilar lymphadenopathy with index precarinal lymph node measuring 1.3 cm in greatest short axis diameter (image 27, series 4, index AP window lymph node measuring 1.5 cm (image 27), index right suprahilar lymph node measuring 1.8 cm (image 33). No definite axillary lymphadenopathy.  There is diffuse decreased attenuation of the hepatic parenchyma suggestive of hepatic steatosis.  Stigmata of ankylosing spondylitis within the thoracic spine. Mildly accentuated thoracic kyphosis. Vertebral body heights appear preserved.  There is diffuse enlargement of the imaged thyroid gland which appears diffusely heterogeneous. The caudal aspect of the left lobe of the thyroid measuring at least 6.6 x 5.0 cm with associated mass effect and right were deviation of the tracheal air column at the level of the thoracic inlet (image 3, series 4).  IMPRESSION: 1. Markedly degraded examination without evidence of  pulmonary embolism to the level of the bilateral subsegmental pulmonary arteries. 2. Suspected pulmonary edema with small bilateral effusions with associated bibasilar opacities, likely atelectasis. 3. Borderline enlarged caliber the main pulmonary artery, nonspecific though could be seen in the setting of pulmonary arterial hypertension. Further evaluation with cardiac  echo could be performed as clinically indicated. 4. Cardiomegaly. Coronary artery calcifications. Calcifications within the mitral valve annulus. 5. Nonspecific mediastinal and hilar lymphadenopathy the etiology of which is not depicted on this examination. 6. Collapse of the central bronchial tree nonspecific though could be seen in the setting of bronchomalacia. Further evaluation with bronchoscopy and/or PFTs could be performed as clinically indicated. 7. Thyromegaly with mass effect upon the tracheal air column at the level of the thoracic inlet. Further evaluation with nonemergent thyroid ultrasound could be performed as clinically indicated. 8. Hepatic steatosis. 9. Stigmata of ankylosing spondylitis within the thoracic spine.   Electronically Signed   By: Sandi Mariscal M.D.   On: 09/23/2013 16:27   Dg Chest Port 1 View  09/24/2013   CLINICAL DATA:  Respiratory distress  EXAM: PORTABLE CHEST - 1 VIEW  COMPARISON:  DG CHEST PORT 1VSAME DAY dated 09/23/2013  FINDINGS: Low lung volumes. Cardiac silhouette enlarged. Patient is status post median sternotomy. Left chest wall cardiac pacing unit lead tips project in the region of the right atrium and right ventricle. Mild prominence of the interstitial markings, and mild peribronchial cuffing. No focal regions of consolidation. Osseous structures grossly unremarkable.  IMPRESSION: Mild pulmonary edema without focal regions of consolidation or focal infiltrates.   Electronically Signed   By: Margaree Mackintosh M.D.   On: 09/24/2013 08:39   Dg Chest Portable 1 View  09/23/2013   CLINICAL DATA:  Shortness of breath.  EXAM: PORTABLE CHEST - 1 VIEW  COMPARISON:  January 30, 2008.  FINDINGS: Sternotomy wires are noted. Stable cardiomegaly. Interval placement of left-sided pacemaker with leads in grossly good position. Mild central pulmonary vascular congestion is with probable minimal bilateral perihilar and basilar edema. No pneumothorax is noted. Minimal bilateral pleural  effusions are noted.  IMPRESSION: Mild central pulmonary vascular congestion is now noted with probable minimal to mild bilateral perihilar and basilar edema with associated minimal pleural effusions.   Electronically Signed   By: Sabino Dick M.D.   On: 09/23/2013 14:24   Dg Chest Port 1v Same Day  09/23/2013   CLINICAL DATA:  History pulmonary embolism, postprocedure  EXAM: PORTABLE CHEST - 1 VIEW SAME DAY  COMPARISON:  CT ANGIO CHEST W/CM &/OR WO/CM dated 09/23/2013; DG CHEST 1V PORT dated 09/23/2013  FINDINGS: Patient is rotated rightward. Left-sided pacemaker overlies stable enlarged cardiac silhouette. No pulmonary edema. Small effusions are noted. No pneumothorax.  IMPRESSION: 1. Cardiomegaly and small effusions.  No change from prior.   Electronically Signed   By: Suzy Bouchard M.D.   On: 09/23/2013 23:40    CBC  Recent Labs Lab 09/23/13 1404 09/23/13 1426 09/23/13 1856 09/24/13 0230 09/25/13 0520  WBC 13.4*  --  10.2 9.4 7.8  HGB 9.8* 10.9* 8.6* 8.6* 9.1*  HCT 31.0* 32.0* 27.5* 27.5* 29.3*  PLT 293  --  266 260 275  MCV 88.3  --  87.9 88.4 89.1  MCH 27.9  --  27.5 27.7 27.7  MCHC 31.6  --  31.3 31.3 31.1  RDW 14.4  --  14.5 14.4 14.6  LYMPHSABS 1.2  --  1.2  --   --   MONOABS 0.7  --  0.3  --   --  EOSABS 0.1  --  0.0  --   --   BASOSABS 0.0  --  0.0  --   --     Chemistries   Recent Labs Lab 09/23/13 1404 09/23/13 1426 09/23/13 1856 09/24/13 0230 09/25/13 0520 09/26/13 0926 09/27/13 0340 09/28/13 0520  NA 136* 136* 136* 139 139 139 138 137  K 4.9 4.7 4.9 4.0 3.8 3.5* 4.2 4.3  CL 93* 93* 92* 95* 94* 87* 87* 87*  CO2 31  --  32 34* 34* 35* 35* 36*  GLUCOSE 282* 283* 289* 91 138* 177* 127* 231*  BUN 16 19 18 18 16 17  25* 34*  CREATININE 0.85 1.00 0.85 0.97 0.91 1.08 1.23* 1.42*  CALCIUM 9.1  --  9.0 8.9 9.0 9.9 9.6 9.6  MG  --   --  2.1 2.0  --   --   --   --   AST 34  --  32  --   --   --   --   --   ALT 20  --  22  --   --   --   --   --   ALKPHOS 92  --   86  --   --   --   --   --   BILITOT 0.5  --  0.5  --   --   --   --   --    ------------------------------------------------------------------------------------------------------------------ estimated creatinine clearance is 33.3 ml/min (by C-G formula based on Cr of 1.42). ------------------------------------------------------------------------------------------------------------------ No results found for this basename: HGBA1C,  in the last 72 hours ------------------------------------------------------------------------------------------------------------------ No results found for this basename: CHOL, HDL, LDLCALC, TRIG, CHOLHDL, LDLDIRECT,  in the last 72 hours ------------------------------------------------------------------------------------------------------------------ No results found for this basename: TSH, T4TOTAL, FREET3, T3FREE, THYROIDAB,  in the last 72 hours ------------------------------------------------------------------------------------------------------------------ No results found for this basename: VITAMINB12, FOLATE, FERRITIN, TIBC, IRON, RETICCTPCT,  in the last 72 hours  Coagulation profile  Recent Labs Lab 09/24/13 0230 09/25/13 0520 09/26/13 0510 09/27/13 0340 09/28/13 0520  INR 2.23* 2.17* 1.99* 1.82* 2.04*    No results found for this basename: DDIMER,  in the last 72 hours  Cardiac Enzymes No results found for this basename: CK, CKMB, TROPONINI, MYOGLOBIN,  in the last 168 hours ------------------------------------------------------------------------------------------------------------------ No components found with this basename: POCBNP,     Malachai Schalk D.O. on 09/28/2013 at 8:35 AM  Between 7am to 7pm - Pager - [phone]  After 7pm go to [email or web address] - password [email or web address]  And look for the night coverage person covering for me after hours  [facility] Office  [phone]

## 2013-09-28 NOTE — Progress Notes (Signed)
Inpatient Diabetes Program Recommendations  AACE/ADA: New Consensus Statement on Inpatient Glycemic Control (2013)  Target Ranges:  Prepandial:   less than 140 mg/dL      Peak postprandial:   less than 180 mg/dL (1-2 hours)      Critically ill patients:  140 - 180 mg/dL   Reason for Visit: Hyperglycemia into 200's since yesterday evening. Added carbohydrate modified to diet orders with co-sign please  Outpatient Diabetes medications: Novolog 70/30 32 units in the am and 28 units ac supper Please check HgbA1C for OP control baseline.  Current orders for Inpatient glycemic control: Novolog 70/30 30 units in the am and 15 units ac supper. Novolog meal coverage 3 units tidwc. (typically we do not add meal coverage to 70/30 regimen as this mix should cover both basal needs and meal coverage. However in this situation, pt appears to need the extra meal coverage and correction as well) Recommend increasing the supper 70/30 to home dose of 28 units. Pt had high fasting this am, probably due to lack of basal insulin during the night. (70/30 given at supper time will only last until approximately midnight) Will follow and recommend as glycemic control is assessed while here. Pt may do well with adding lantus (rather than meal coverage to cover basal needs..Calculated at 0.2 units per kg (low end of dosing), pt would need at least 15 units lantus or levemir. Thank you, Rosita Kea, RN, CNS, Diabetes Coordinator [phone])

## 2013-09-28 NOTE — Progress Notes (Signed)
Physical Therapy Treatment Patient Details Name: Kimberly Peterson MRN: [id number] DOB: Apr 02, 1930 Today's Date: 09/28/2013    History of Present Illness 80 year old female with PMH of HTN and DM. Recent discharged from [facility] for PE. Presents with SOB and hypoxia 3/20.  Patient with acute resp failure, heart failure, pulm edema.  Patient has pacemaker.    PT Comments    Pt progressing towards goals, is transferring independently, ambulates on 2L O2 with sats 95%. Activity tolerance slowly improving, pt still requiring seated rest break due to fatigue with ambulation. Pt has rollator at home, education on consistent use of this. PT will continue to follow.   Follow Up Recommendations  Home health PT;Supervision/Assistance - 24 hour     Equipment Recommendations  None recommended by PT    Recommendations for Other Services       Precautions / Restrictions Precautions Precautions: Fall Restrictions Weight Bearing Restrictions: No    Mobility  Bed Mobility               General bed mobility comments: pt OOB upon arrival  Transfers Overall transfer level: Modified independent Equipment used: None Transfers: Stand Pivot Transfers Sit to Stand: Modified independent (Device/Increase time) Stand pivot transfers: Modified independent (Device/Increase time)       General transfer comment: performing SPT safely without AD  Ambulation/Gait Ambulation/Gait assistance: Min guard Ambulation Distance (Feet): 200 Feet (100' x 2) Assistive device: Rolling walker (2 wheeled) Gait Pattern/deviations: Step-through pattern;Decreased stride length;Trunk flexed Gait velocity: Slow gait speed Gait velocity interpretation: Below normal speed for age/gender General Gait Details: Pt needed seated rest break after 100', ~ 41mins before DOE decreased enough for pt to resume ambulation. however, O2 sats 95% on 2L, pt reported fatigue, not dyspnea necessitated rest   Stairs             Wheelchair Mobility    Modified Rankin (Stroke Patients Only)       Balance Overall balance assessment: Needs assistance         Standing balance support: No upper extremity supported;During functional activity Standing balance-Leahy Scale: Good Standing balance comment: pt able to stand for short periods without UE support for self care, however, with ambulation, becomes unsteady with fatigue and requires UE support                    Cognition Arousal/Alertness: Awake/alert Behavior During Therapy: WFL for tasks assessed/performed Overall Cognitive Status: Within Functional Limits for tasks assessed                      Exercises General Exercises - Lower Extremity Ankle Circles/Pumps: AROM;Both;20 reps;Seated    General Comments        Pertinent Vitals/Pain O2 sats 95% on 2L    Home Living                      Prior Function            PT Goals (current goals can now be found in the care plan section) Acute Rehab PT Goals Patient Stated Goal: To go home soon PT Goal Formulation: With patient Time For Goal Achievement: 10/03/13 Potential to Achieve Goals: Good Progress towards PT goals: Progressing toward goals    Frequency  Min 3X/week    PT Plan Current plan remains appropriate    End of Session Equipment Utilized During Treatment: Gait belt;Oxygen Activity Tolerance: Patient limited by fatigue Patient left: in chair;with call  bell/phone within reach     Time: 1325-1348 PT Time Calculation (min): 23 min  Charges:  $Gait Training: 23-37 mins                    G Codes:     Leighton Roach, PT  Acute Rehab Services  [phone]  Leighton Roach 09/28/2013, 3:19 PM

## 2013-09-29 DIAGNOSIS — J9600 Acute respiratory failure, unspecified whether with hypoxia or hypercapnia: Secondary | ICD-10-CM | POA: Diagnosis not present

## 2013-09-29 DIAGNOSIS — I509 Heart failure, unspecified: Secondary | ICD-10-CM | POA: Diagnosis not present

## 2013-09-29 DIAGNOSIS — I5031 Acute diastolic (congestive) heart failure: Secondary | ICD-10-CM | POA: Diagnosis not present

## 2013-09-29 DIAGNOSIS — N179 Acute kidney failure, unspecified: Secondary | ICD-10-CM | POA: Diagnosis not present

## 2013-09-29 LAB — CBC
HEMATOCRIT: 32 % — AB (ref 36.0–46.0)
Hemoglobin: 10 g/dL — ABNORMAL LOW (ref 12.0–15.0)
MCH: 27.7 pg (ref 26.0–34.0)
MCHC: 31.3 g/dL (ref 30.0–36.0)
MCV: 88.6 fL (ref 78.0–100.0)
Platelets: 302 10*3/uL (ref 150–400)
RBC: 3.61 MIL/uL — AB (ref 3.87–5.11)
RDW: 14.3 % (ref 11.5–15.5)
WBC: 7.2 10*3/uL (ref 4.0–10.5)

## 2013-09-29 LAB — BASIC METABOLIC PANEL
BUN: 34 mg/dL — AB (ref 6–23)
CHLORIDE: 89 meq/L — AB (ref 96–112)
CO2: 36 mEq/L — ABNORMAL HIGH (ref 19–32)
Calcium: 9.4 mg/dL (ref 8.4–10.5)
Creatinine, Ser: 1.19 mg/dL — ABNORMAL HIGH (ref 0.50–1.10)
GFR calc Af Amer: 48 mL/min — ABNORMAL LOW (ref >90)
GFR calc non Af Amer: 42 mL/min — ABNORMAL LOW (ref >90)
GLUCOSE: 92 mg/dL (ref 70–99)
Potassium: 3.9 mEq/L (ref 3.7–5.3)
Sodium: 136 mEq/L — ABNORMAL LOW (ref 137–147)

## 2013-09-29 LAB — PROTIME-INR
INR: 2.37 — ABNORMAL HIGH (ref 0.00–1.49)
PROTHROMBIN TIME: 25.1 s — AB (ref 11.6–15.2)

## 2013-09-29 LAB — GLUCOSE, CAPILLARY
GLUCOSE-CAPILLARY: 98 mg/dL (ref 70–99)
GLUCOSE-CAPILLARY: 137 mg/dL — AB (ref 70–99)

## 2013-09-29 MED ORDER — FUROSEMIDE 40 MG PO TABS: 20.0000 mg | ORAL_TABLET | Freq: Every morning | ORAL | Status: DC

## 2013-09-29 MED ORDER — WARFARIN SODIUM 5 MG PO TABS: 5.0000 mg | ORAL_TABLET | Freq: Every day | ORAL | Status: DC

## 2013-09-29 NOTE — Progress Notes (Signed)
Discharge instructions given, patient and daughter understood the instructions.  Patient stable.

## 2013-09-29 NOTE — Progress Notes (Signed)
I co-sign Karren Cobble SNT notes and assessments

## 2013-09-29 NOTE — Discharge Summary (Signed)
Physician Discharge Summary  Kimberly Peterson [id number] DOB: May 20, 1930 DOA: 09/23/2013  PCP: [name], MD  Admit date: 09/23/2013 Discharge date: 09/29/2013  Time spent: 35 minutes  Recommendations for Outpatient Follow-up:  Will be discharged to home with home health. To her primary care physician within one week of discharge. Patient to continue her medications as prescribed. Patient should follow a low sodium diet. Patient will also need to have her INR and renal function checked within one week.  Discharge Diagnoses:  Active Problems:   Acute respiratory failure   Respiratory acidosis   Pulmonary embolism   Pulmonary edema   Acute diastolic heart failure   Discharge Condition: Stable  Diet recommendation: Low sodium  Filed Weights   09/27/13 0651 09/28/13 0656 09/29/13 0631  Weight: 93.577 kg (206 lb 4.8 oz) 94.484 kg (208 lb 4.8 oz) 94.575 kg (208 lb 8 oz)    History of present illness:  80 year old female with PMH as below in addition to CHF. She was recently discharged from [facility] about a week ago for PE. Was at a follow up appointment today, and found to be hypoxic with SpO2 70% on room air. She was placed on CPAP and O2 sat normalized. She was brought to [facility] ED noted to be in moderate-severe respiratory distress. PCCM asked to admit.   Hospital Course:  Acute respiratory failure due to acute on chronic diastolic heart failure/Pulmonary edema:  -Required bipap on admission. Now off bipap. Still fluid overloaded.  -Off NTG. Was on lasix ggt and received on dose of metolazone  -Continue daily weights, strict I's and O's, and fluid restriction  -Has lost 10kg since admission   Acute kidney injury  -Likely secondary to diuresis  -Was placed on gentle rehydration with IVF -Cr 1.19, trending downward, baseline <1   Pulmonary embolism:  -continue coumadin 5mg  daily -Patient will need outpatient PT/INR check within one week  Diabetes mellitus:  -contnue Novolog  70/30 and glimepiride -HbgA1c 6.6.  -Will need further titration of meds as an outpatient.   Procedures: Echocardiogram  Study Conclusions - Left ventricle: The cavity size was normal. Wall thickness was increased in a pattern of mild LVH. There was focal basal hypertrophy. Systolic function was normal. The estimated ejection fraction was in the range of 60% to 65%. Wall motion was normal; there were no regional wall motion abnormalities. - Mitral valve: Severely calcified annulus. The findings are consistent with mild stenosis. - Left atrium: The atrium was mildly dilated.  - Right atrium: The atrium was mildly dilated. - Pulmonary arteries: Systolic pressure was moderately to severely increased. PA peak pressure: 32mm Hg (S).  Consultations:  PCCM  Discharge Exam: Filed Vitals:   09/29/13 0830  BP: 123/44  Pulse: 77  Temp: 98.4 F (36.9 C)  Resp: 18   Exam  General: Well developed, well nourished, NAD, appears stated age  HEENT: NCAT, PERRLA, EOMI, Anicteic Sclera, mucous membranes moist.  Neck: Supple, no JVD, no masses  Cardiovascular: S1 S2 auscultated, no rubs, murmurs or gallops. Regular rate and rhythm.  Respiratory: Clear to auscultation bilaterally with equal chest rise  Abdomen: Soft, nontender, nondistended, + bowel sounds  Extremities: warm dry without cyanosis clubbing or edema  Neuro: AAOx3, cranial nerves grossly intact. Strength 5/5 in patient's upper and lower extremities bilaterally  Skin: Without rashes exudates or nodules  Psych: Normal affect and demeanor with intact judgement and insight  Discharge Instructions      Discharge Orders   Future Orders  Complete By Expires   Diet - low sodium heart healthy  As directed    Discharge instructions  As directed    Comments:     Will be discharged to home with home health. To her primary care physician within one week of discharge. Patient to continue her medications as prescribed. Patient should follow a  low sodium diet. Patient will also need to have her INR and renal function checked within one week.   Increase activity slowly  As directed        Medication List    STOP taking these medications       diltiazem 240 MG 24 hr capsule  Commonly known as:  DILACOR XR      TAKE these medications       aspirin 81 MG tablet  Take 81 mg by mouth daily.     carvedilol 25 MG tablet  Commonly known as:  COREG  Take 25 mg by mouth 2 (two) times daily with a meal.     furosemide 40 MG tablet  Commonly known as:  LASIX  Take 0.5 tablets (20 mg total) by mouth every morning.     glimepiride 4 MG tablet  Commonly known as:  AMARYL  Take 2 mg by mouth daily after supper.     hydrALAZINE 25 MG tablet  Commonly known as:  APRESOLINE  Take 25 mg by mouth 3 (three) times daily.     insulin aspart protamine- aspart (70-30) 100 UNIT/ML injection  Commonly known as:  NOVOLOG MIX 70/30  Inject 28-32 Units into the skin 2 (two) times daily with a meal. 32 units in morning and 28 units in the evening     warfarin 5 MG tablet  Commonly known as:  COUMADIN  Take 1 tablet (5 mg total) by mouth daily.       No Known Allergies Follow-up Information   Follow up with Sherren Mocha, MD In 1 week. (hospital follow up)    Specialty:  Cardiology   Contact information:   [id number] [place] [address] [phone]       Follow up with [facility]. (Physical and Occupational Services to begin within 24-48 hours of d/c.)    Contact information:   [address] [place] [address] [phone]       Follow up with [name], MD. Schedule an appointment as soon as possible for a visit in 1 week. [facility] follow up and INR check)    Specialty:  Internal Medicine   Contact information:   [place] [address] [phone]        The results of significant diagnostics from this hospitalization (including imaging,  microbiology, ancillary and laboratory) are listed below for reference.    Significant Diagnostic Studies: Ct Angio Chest Pe W/cm &/or Wo Cm  09/23/2013   CLINICAL DATA:  Shortness of breath, history of pulmonary embolism, hypertension, diabetes, evaluate for pulmonary embolism.  EXAM: CT ANGIOGRAPHY CHEST WITH CONTRAST  TECHNIQUE: Multidetector CT imaging of the chest was performed using the standard protocol during bolus administration of intravenous contrast. Multiplanar CT image reconstructions and MIPs were obtained to evaluate the vascular anatomy.  CONTRAST:  47mL OMNIPAQUE IOHEXOL 350 MG/ML SOLN  COMPARISON:  DG CHEST 1V PORT dated 09/23/2013  FINDINGS: Vascular Findings:  There is adequate opacification of the pulmonary arterial system with the main pulmonary artery measuring 296 Hounsfield units. There are no discrete filling defects within  the pulmonary arterial tree to the level of the bilateral subsegmental pulmonary arteries. Evaluation of distal subsegmental pulmonary arteries is degraded secondary to a combination of suboptimal vessel opacification and patient body habitus. Borderline enlarged caliber the main pulmonary artery measuring 32 mm in diameter.  Post median sternotomy. Coronary artery calcifications. Calcifications within the mitral valve annulus. Left anterior chest wall dual lead pacemaker with lead tips terminating within the right atrium and ventricle. No pericardial effusion. Scattered atherosclerotic plaque within a normal caliber thoracic aorta.  Review of the MIP images confirms the above findings.   ----------------------------------------------------------------------------------  Nonvascular Findings:  Examination is markedly degraded secondary to patient body habitus.  Small bilateral pleural effusions with associated dependent bibasilar opacities, right greater left. No definitive air bronchograms. No pneumothorax. There is mild interlobular septal thickening most  conspicuous within the bilateral upper lungs. There is collapse of the right and left mainstem bronchi as well as multiple segmental bronchi. No pneumothorax.  Mediastinal and hilar lymphadenopathy with index precarinal lymph node measuring 1.3 cm in greatest short axis diameter (image 27, series 4, index AP window lymph node measuring 1.5 cm (image 27), index right suprahilar lymph node measuring 1.8 cm (image 33). No definite axillary lymphadenopathy.  There is diffuse decreased attenuation of the hepatic parenchyma suggestive of hepatic steatosis.  Stigmata of ankylosing spondylitis within the thoracic spine. Mildly accentuated thoracic kyphosis. Vertebral body heights appear preserved.  There is diffuse enlargement of the imaged thyroid gland which appears diffusely heterogeneous. The caudal aspect of the left lobe of the thyroid measuring at least 6.6 x 5.0 cm with associated mass effect and right were deviation of the tracheal air column at the level of the thoracic inlet (image 3, series 4).  IMPRESSION: 1. Markedly degraded examination without evidence of pulmonary embolism to the level of the bilateral subsegmental pulmonary arteries. 2. Suspected pulmonary edema with small bilateral effusions with associated bibasilar opacities, likely atelectasis. 3. Borderline enlarged caliber the main pulmonary artery, nonspecific though could be seen in the setting of pulmonary arterial hypertension. Further evaluation with cardiac echo could be performed as clinically indicated. 4. Cardiomegaly. Coronary artery calcifications. Calcifications within the mitral valve annulus. 5. Nonspecific mediastinal and hilar lymphadenopathy the etiology of which is not depicted on this examination. 6. Collapse of the central bronchial tree nonspecific though could be seen in the setting of bronchomalacia. Further evaluation with bronchoscopy and/or PFTs could be performed as clinically indicated. 7. Thyromegaly with mass effect upon  the tracheal air column at the level of the thoracic inlet. Further evaluation with nonemergent thyroid ultrasound could be performed as clinically indicated. 8. Hepatic steatosis. 9. Stigmata of ankylosing spondylitis within the thoracic spine.   Electronically Signed   By: Sandi Mariscal M.D.   On: 09/23/2013 16:27   Dg Chest Port 1 View  09/24/2013   CLINICAL DATA:  Respiratory distress  EXAM: PORTABLE CHEST - 1 VIEW  COMPARISON:  DG CHEST PORT 1VSAME DAY dated 09/23/2013  FINDINGS: Low lung volumes. Cardiac silhouette enlarged. Patient is status post median sternotomy. Left chest wall cardiac pacing unit lead tips project in the region of the right atrium and right ventricle. Mild prominence of the interstitial markings, and mild peribronchial cuffing. No focal regions of consolidation. Osseous structures grossly unremarkable.  IMPRESSION: Mild pulmonary edema without focal regions of consolidation or focal infiltrates.   Electronically Signed   By: Margaree Mackintosh M.D.   On: 09/24/2013 08:39   Dg Chest Portable 1 View  09/23/2013  CLINICAL DATA:  Shortness of breath.  EXAM: PORTABLE CHEST - 1 VIEW  COMPARISON:  January 30, 2008.  FINDINGS: Sternotomy wires are noted. Stable cardiomegaly. Interval placement of left-sided pacemaker with leads in grossly good position. Mild central pulmonary vascular congestion is with probable minimal bilateral perihilar and basilar edema. No pneumothorax is noted. Minimal bilateral pleural effusions are noted.  IMPRESSION: Mild central pulmonary vascular congestion is now noted with probable minimal to mild bilateral perihilar and basilar edema with associated minimal pleural effusions.   Electronically Signed   By: Sabino Dick M.D.   On: 09/23/2013 14:24   Dg Chest Port 1v Same Day  09/23/2013   CLINICAL DATA:  History pulmonary embolism, postprocedure  EXAM: PORTABLE CHEST - 1 VIEW SAME DAY  COMPARISON:  CT ANGIO CHEST W/CM &/OR WO/CM dated 09/23/2013; DG CHEST 1V PORT dated  09/23/2013  FINDINGS: Patient is rotated rightward. Left-sided pacemaker overlies stable enlarged cardiac silhouette. No pulmonary edema. Small effusions are noted. No pneumothorax.  IMPRESSION: 1. Cardiomegaly and small effusions.  No change from prior.   Electronically Signed   By: Suzy Bouchard M.D.   On: 09/23/2013 23:40    Microbiology: Recent Results (from the past 240 hour(s))  URINE CULTURE     Status: None   Collection Time    09/23/13  5:54 PM      Result Value Ref Range Status   Specimen Description URINE, CATHETERIZED   Final   Special Requests NONE   Final   Culture  Setup Time     Final   Value: 09/23/2013 18:54     Performed at [facility]     Final   Value: NO GROWTH     Performed at [facility]   Culture     Final   Value: NO GROWTH     Performed at [facility]   Report Status 09/24/2013 FINAL   Final  MRSA PCR SCREENING     Status: None   Collection Time    09/23/13  6:15 PM      Result Value Ref Range Status   MRSA by PCR NEGATIVE  NEGATIVE Final   Comment:            The GeneXpert MRSA Assay (FDA     approved for NASAL specimens     only), is one component of a     comprehensive MRSA colonization     surveillance program. It is not     intended to diagnose MRSA     infection nor to guide or     monitor treatment for     MRSA infections.  CULTURE, BLOOD (ROUTINE X 2)     Status: None   Collection Time    09/23/13  6:56 PM      Result Value Ref Range Status   Specimen Description BLOOD LEFT THUMB LEFT   Final   Special Requests BOTTLES DRAWN AEROBIC ONLY 8CC   Final   Culture  Setup Time     Final   Value: 09/24/2013 00:43     Performed at [facility]   Culture     Final   Value:        BLOOD CULTURE RECEIVED NO GROWTH TO DATE CULTURE WILL BE HELD FOR 5 DAYS BEFORE ISSUING A FINAL NEGATIVE REPORT     Performed at [facility]   Report Status PENDING   Incomplete  CULTURE, BLOOD (ROUTINE X  2)     Status: None   Collection Time    09/23/13  7:12 PM      Result Value Ref Range Status   Specimen Description BLOOD RIGHT HAND   Final   Special Requests BOTTLES DRAWN AEROBIC ONLY 10CC   Final   Culture  Setup Time     Final   Value: 09/24/2013 00:44     Performed at [facility]   Culture     Final   Value:        BLOOD CULTURE RECEIVED NO GROWTH TO DATE CULTURE WILL BE HELD FOR 5 DAYS BEFORE ISSUING A FINAL NEGATIVE REPORT     Performed at [facility]   Report Status PENDING   Incomplete     Labs: Basic Metabolic Panel:  Recent Labs Lab 09/23/13 1426 09/23/13 1856 09/24/13 0230 09/25/13 0520 09/26/13 0926 09/27/13 0340 09/28/13 0520 09/29/13 0545  NA 136* 136* 139 139 139 138 137 136*  K 4.7 4.9 4.0 3.8 3.5* 4.2 4.3 3.9  CL 93* 92* 95* 94* 87* 87* 87* 89*  CO2  --  32 34* 34* 35* 35* 36* 36*  GLUCOSE 283* 289* 91 138* 177* 127* 231* 92  BUN 19 18 18 16 17  25* 34* 34*  CREATININE 1.00 0.85 0.97 0.91 1.08 1.23* 1.42* 1.19*  CALCIUM  --  9.0 8.9 9.0 9.9 9.6 9.6 9.4  MG  --  2.1 2.0  --   --   --   --   --   PHOS  --  4.0 4.1  --   --   --   --   --    Liver Function Tests:  Recent Labs Lab 09/23/13 1404 09/23/13 1856  AST 34 32  ALT 20 22  ALKPHOS 92 86  BILITOT 0.5 0.5  PROT 7.7 7.2  ALBUMIN 3.2* 3.0*   No results found for this basename: LIPASE, AMYLASE,  in the last 168 hours No results found for this basename: AMMONIA,  in the last 168 hours CBC:  Recent Labs Lab 09/23/13 1404 09/23/13 1426 09/23/13 1856 09/24/13 0230 09/25/13 0520 09/29/13 0545  WBC 13.4*  --  10.2 9.4 7.8 7.2  NEUTROABS 11.4*  --  8.7*  --   --   --   HGB 9.8* 10.9* 8.6* 8.6* 9.1* 10.0*  HCT 31.0* 32.0* 27.5* 27.5* 29.3* 32.0*  MCV 88.3  --  87.9 88.4 89.1 88.6  PLT 293  --  266 260 275 302   Cardiac Enzymes: No results found for this basename: CKTOTAL, CKMB, CKMBINDEX, TROPONINI,  in the last 168 hours BNP: BNP (last 3 results)  Recent Labs   09/23/13 1404 09/23/13 1856  PROBNP 2533.0* 2961.0*   CBG:  Recent Labs Lab 09/28/13 0619 09/28/13 1214 09/28/13 1627 09/28/13 2140 09/29/13 0600  GLUCAP 212* 303* 149* 76 98       Signed:  Haniya Fern  [facility] 09/29/2013, 9:52 AM

## 2013-09-29 NOTE — Progress Notes (Signed)
Pt was alert and orientated during morning assessment.  She was able to ambulate 500 ft down hall without any issues.  She was on 2L of O2 while walking.

## 2013-09-29 NOTE — Plan of Care (Signed)
Problem: Phase II Progression Outcomes Goal: O2 sats > equal to 90% on RA or at baseline Outcome: Not Applicable Date Met:  16/10/96 Pt uses O2 at home, 2 liters.

## 2013-09-29 NOTE — Progress Notes (Signed)
Patient evaluated for community based chronic disease management services with [facility] as a benefit of patient's [employer]. Spoke with patient at bedside to explain [facility] services.  Patient is going home with [facility] and feels she can otherwise self manage.  Left contact information and [facility] literature at bedside. Made Inpatient Case Manager aware that [facility] following. Of note, [facility] Care Management services does not replace or interfere with any services that are arranged by inpatient case management or social work.  For additional questions or referrals please contact Corliss Blacker BSN RN [facility] Liaison at [phone].

## 2013-09-30 LAB — CULTURE, BLOOD (ROUTINE X 2)
CULTURE: NO GROWTH
Culture: NO GROWTH

## 2013-10-01 DIAGNOSIS — I10 Essential (primary) hypertension: Secondary | ICD-10-CM | POA: Diagnosis not present

## 2013-10-01 DIAGNOSIS — E119 Type 2 diabetes mellitus without complications: Secondary | ICD-10-CM | POA: Diagnosis not present

## 2013-10-01 DIAGNOSIS — I5021 Acute systolic (congestive) heart failure: Secondary | ICD-10-CM | POA: Diagnosis not present

## 2013-10-01 DIAGNOSIS — M6281 Muscle weakness (generalized): Secondary | ICD-10-CM | POA: Diagnosis not present

## 2013-10-04 DIAGNOSIS — I10 Essential (primary) hypertension: Secondary | ICD-10-CM | POA: Diagnosis not present

## 2013-10-04 DIAGNOSIS — E119 Type 2 diabetes mellitus without complications: Secondary | ICD-10-CM | POA: Diagnosis not present

## 2013-10-04 DIAGNOSIS — M6281 Muscle weakness (generalized): Secondary | ICD-10-CM | POA: Diagnosis not present

## 2013-10-04 DIAGNOSIS — I5021 Acute systolic (congestive) heart failure: Secondary | ICD-10-CM | POA: Diagnosis not present

## 2013-10-06 DIAGNOSIS — I10 Essential (primary) hypertension: Secondary | ICD-10-CM | POA: Diagnosis not present

## 2013-10-06 DIAGNOSIS — I5021 Acute systolic (congestive) heart failure: Secondary | ICD-10-CM | POA: Diagnosis not present

## 2013-10-06 DIAGNOSIS — E119 Type 2 diabetes mellitus without complications: Secondary | ICD-10-CM | POA: Diagnosis not present

## 2013-10-06 DIAGNOSIS — M6281 Muscle weakness (generalized): Secondary | ICD-10-CM | POA: Diagnosis not present

## 2013-10-10 DIAGNOSIS — R0902 Hypoxemia: Secondary | ICD-10-CM | POA: Diagnosis not present

## 2013-10-10 DIAGNOSIS — E1059 Type 1 diabetes mellitus with other circulatory complications: Secondary | ICD-10-CM | POA: Diagnosis not present

## 2013-10-10 DIAGNOSIS — N183 Chronic kidney disease, stage 3 unspecified: Secondary | ICD-10-CM | POA: Diagnosis not present

## 2013-10-10 DIAGNOSIS — I2699 Other pulmonary embolism without acute cor pulmonale: Secondary | ICD-10-CM | POA: Diagnosis not present

## 2013-10-10 DIAGNOSIS — Z7901 Long term (current) use of anticoagulants: Secondary | ICD-10-CM | POA: Diagnosis not present

## 2013-10-10 DIAGNOSIS — IMO0001 Reserved for inherently not codable concepts without codable children: Secondary | ICD-10-CM | POA: Diagnosis not present

## 2013-10-10 DIAGNOSIS — I5033 Acute on chronic diastolic (congestive) heart failure: Secondary | ICD-10-CM | POA: Diagnosis not present

## 2013-10-10 DIAGNOSIS — J9600 Acute respiratory failure, unspecified whether with hypoxia or hypercapnia: Secondary | ICD-10-CM | POA: Diagnosis not present

## 2013-10-11 DIAGNOSIS — I5021 Acute systolic (congestive) heart failure: Secondary | ICD-10-CM | POA: Diagnosis not present

## 2013-10-11 DIAGNOSIS — I10 Essential (primary) hypertension: Secondary | ICD-10-CM | POA: Diagnosis not present

## 2013-10-11 DIAGNOSIS — E119 Type 2 diabetes mellitus without complications: Secondary | ICD-10-CM | POA: Diagnosis not present

## 2013-10-11 DIAGNOSIS — M6281 Muscle weakness (generalized): Secondary | ICD-10-CM | POA: Diagnosis not present

## 2013-10-14 DIAGNOSIS — I10 Essential (primary) hypertension: Secondary | ICD-10-CM | POA: Diagnosis not present

## 2013-10-14 DIAGNOSIS — E119 Type 2 diabetes mellitus without complications: Secondary | ICD-10-CM | POA: Diagnosis not present

## 2013-10-14 DIAGNOSIS — M6281 Muscle weakness (generalized): Secondary | ICD-10-CM | POA: Diagnosis not present

## 2013-10-14 DIAGNOSIS — I5021 Acute systolic (congestive) heart failure: Secondary | ICD-10-CM | POA: Diagnosis not present

## 2013-10-17 DIAGNOSIS — G4730 Sleep apnea, unspecified: Secondary | ICD-10-CM | POA: Diagnosis not present

## 2013-10-17 DIAGNOSIS — G4710 Hypersomnia, unspecified: Secondary | ICD-10-CM | POA: Diagnosis not present

## 2013-10-17 DIAGNOSIS — R0602 Shortness of breath: Secondary | ICD-10-CM | POA: Diagnosis not present

## 2013-10-17 DIAGNOSIS — E1065 Type 1 diabetes mellitus with hyperglycemia: Secondary | ICD-10-CM | POA: Diagnosis not present

## 2013-10-17 DIAGNOSIS — I2699 Other pulmonary embolism without acute cor pulmonale: Secondary | ICD-10-CM | POA: Diagnosis not present

## 2013-10-17 DIAGNOSIS — E162 Hypoglycemia, unspecified: Secondary | ICD-10-CM | POA: Diagnosis not present

## 2013-10-17 DIAGNOSIS — IMO0002 Reserved for concepts with insufficient information to code with codable children: Secondary | ICD-10-CM | POA: Diagnosis not present

## 2013-10-17 DIAGNOSIS — I5033 Acute on chronic diastolic (congestive) heart failure: Secondary | ICD-10-CM | POA: Diagnosis not present

## 2013-10-17 DIAGNOSIS — Z7901 Long term (current) use of anticoagulants: Secondary | ICD-10-CM | POA: Diagnosis not present

## 2013-10-18 DIAGNOSIS — M6281 Muscle weakness (generalized): Secondary | ICD-10-CM | POA: Diagnosis not present

## 2013-10-18 DIAGNOSIS — I10 Essential (primary) hypertension: Secondary | ICD-10-CM | POA: Diagnosis not present

## 2013-10-18 DIAGNOSIS — E119 Type 2 diabetes mellitus without complications: Secondary | ICD-10-CM | POA: Diagnosis not present

## 2013-10-18 DIAGNOSIS — I5021 Acute systolic (congestive) heart failure: Secondary | ICD-10-CM | POA: Diagnosis not present

## 2013-10-21 DIAGNOSIS — I10 Essential (primary) hypertension: Secondary | ICD-10-CM | POA: Diagnosis not present

## 2013-10-21 DIAGNOSIS — M6281 Muscle weakness (generalized): Secondary | ICD-10-CM | POA: Diagnosis not present

## 2013-10-21 DIAGNOSIS — I5021 Acute systolic (congestive) heart failure: Secondary | ICD-10-CM | POA: Diagnosis not present

## 2013-10-21 DIAGNOSIS — E119 Type 2 diabetes mellitus without complications: Secondary | ICD-10-CM | POA: Diagnosis not present

## 2013-10-24 DIAGNOSIS — I10 Essential (primary) hypertension: Secondary | ICD-10-CM | POA: Diagnosis not present

## 2013-10-24 DIAGNOSIS — E119 Type 2 diabetes mellitus without complications: Secondary | ICD-10-CM | POA: Diagnosis not present

## 2013-10-24 DIAGNOSIS — M6281 Muscle weakness (generalized): Secondary | ICD-10-CM | POA: Diagnosis not present

## 2013-10-24 DIAGNOSIS — I5021 Acute systolic (congestive) heart failure: Secondary | ICD-10-CM | POA: Diagnosis not present

## 2013-10-25 DIAGNOSIS — E119 Type 2 diabetes mellitus without complications: Secondary | ICD-10-CM | POA: Diagnosis not present

## 2013-10-25 DIAGNOSIS — I509 Heart failure, unspecified: Secondary | ICD-10-CM | POA: Diagnosis not present

## 2013-10-26 DIAGNOSIS — I509 Heart failure, unspecified: Secondary | ICD-10-CM | POA: Diagnosis not present

## 2013-10-26 DIAGNOSIS — E119 Type 2 diabetes mellitus without complications: Secondary | ICD-10-CM | POA: Diagnosis not present

## 2013-10-27 ENCOUNTER — Encounter: Payer: Medicare Other | Admitting: Cardiology

## 2013-10-28 DIAGNOSIS — I509 Heart failure, unspecified: Secondary | ICD-10-CM | POA: Diagnosis not present

## 2013-10-28 DIAGNOSIS — E119 Type 2 diabetes mellitus without complications: Secondary | ICD-10-CM | POA: Diagnosis not present

## 2013-10-31 DIAGNOSIS — I2699 Other pulmonary embolism without acute cor pulmonale: Secondary | ICD-10-CM | POA: Diagnosis not present

## 2013-10-31 DIAGNOSIS — G4710 Hypersomnia, unspecified: Secondary | ICD-10-CM | POA: Diagnosis not present

## 2013-10-31 DIAGNOSIS — G4730 Sleep apnea, unspecified: Secondary | ICD-10-CM | POA: Diagnosis not present

## 2013-11-01 DIAGNOSIS — E119 Type 2 diabetes mellitus without complications: Secondary | ICD-10-CM | POA: Diagnosis not present

## 2013-11-01 DIAGNOSIS — I509 Heart failure, unspecified: Secondary | ICD-10-CM | POA: Diagnosis not present

## 2013-11-04 DIAGNOSIS — I509 Heart failure, unspecified: Secondary | ICD-10-CM | POA: Diagnosis not present

## 2013-11-04 DIAGNOSIS — E119 Type 2 diabetes mellitus without complications: Secondary | ICD-10-CM | POA: Diagnosis not present

## 2013-11-10 DIAGNOSIS — I509 Heart failure, unspecified: Secondary | ICD-10-CM | POA: Diagnosis not present

## 2013-11-10 DIAGNOSIS — E119 Type 2 diabetes mellitus without complications: Secondary | ICD-10-CM | POA: Diagnosis not present

## 2013-11-15 DIAGNOSIS — E119 Type 2 diabetes mellitus without complications: Secondary | ICD-10-CM | POA: Diagnosis not present

## 2013-11-15 DIAGNOSIS — I509 Heart failure, unspecified: Secondary | ICD-10-CM | POA: Diagnosis not present

## 2013-11-17 DIAGNOSIS — I509 Heart failure, unspecified: Secondary | ICD-10-CM | POA: Diagnosis not present

## 2013-11-17 DIAGNOSIS — E119 Type 2 diabetes mellitus without complications: Secondary | ICD-10-CM | POA: Diagnosis not present

## 2013-11-21 ENCOUNTER — Emergency Department (HOSPITAL_COMMUNITY)
Admission: EM | Admit: 2013-11-21 | Discharge: 2013-11-21 | Disposition: A | Discharge status: Home / Self Care | Payer: Medicare Other | Attending: Emergency Medicine | Admitting: Emergency Medicine

## 2013-11-21 ENCOUNTER — Emergency Department (HOSPITAL_COMMUNITY): Payer: Medicare Other

## 2013-11-21 ENCOUNTER — Encounter (HOSPITAL_COMMUNITY): Payer: Self-pay | Admitting: Emergency Medicine

## 2013-11-21 DIAGNOSIS — Z8719 Personal history of other diseases of the digestive system: Secondary | ICD-10-CM | POA: Insufficient documentation

## 2013-11-21 DIAGNOSIS — Z7982 Long term (current) use of aspirin: Secondary | ICD-10-CM | POA: Diagnosis not present

## 2013-11-21 DIAGNOSIS — R079 Chest pain, unspecified: Secondary | ICD-10-CM | POA: Diagnosis not present

## 2013-11-21 DIAGNOSIS — E119 Type 2 diabetes mellitus without complications: Secondary | ICD-10-CM | POA: Diagnosis not present

## 2013-11-21 DIAGNOSIS — Z86711 Personal history of pulmonary embolism: Secondary | ICD-10-CM | POA: Diagnosis not present

## 2013-11-21 DIAGNOSIS — D3500 Benign neoplasm of unspecified adrenal gland: Secondary | ICD-10-CM | POA: Diagnosis not present

## 2013-11-21 DIAGNOSIS — Z794 Long term (current) use of insulin: Secondary | ICD-10-CM | POA: Diagnosis not present

## 2013-11-21 DIAGNOSIS — Z79899 Other long term (current) drug therapy: Secondary | ICD-10-CM | POA: Insufficient documentation

## 2013-11-21 DIAGNOSIS — I509 Heart failure, unspecified: Secondary | ICD-10-CM | POA: Diagnosis not present

## 2013-11-21 DIAGNOSIS — Z7901 Long term (current) use of anticoagulants: Secondary | ICD-10-CM | POA: Diagnosis not present

## 2013-11-21 DIAGNOSIS — K5900 Constipation, unspecified: Secondary | ICD-10-CM | POA: Diagnosis not present

## 2013-11-21 DIAGNOSIS — Z950 Presence of cardiac pacemaker: Secondary | ICD-10-CM | POA: Diagnosis not present

## 2013-11-21 DIAGNOSIS — I10 Essential (primary) hypertension: Secondary | ICD-10-CM | POA: Insufficient documentation

## 2013-11-21 DIAGNOSIS — R109 Unspecified abdominal pain: Secondary | ICD-10-CM | POA: Diagnosis not present

## 2013-11-21 LAB — CBC
HCT: 32.7 % — ABNORMAL LOW (ref 36.0–46.0)
Hemoglobin: 10.2 g/dL — ABNORMAL LOW (ref 12.0–15.0)
MCH: 26.8 pg (ref 26.0–34.0)
MCHC: 31.2 g/dL (ref 30.0–36.0)
MCV: 86.1 fL (ref 78.0–100.0)
PLATELETS: 237 10*3/uL (ref 150–400)
RBC: 3.8 MIL/uL — ABNORMAL LOW (ref 3.87–5.11)
RDW: 14.1 % (ref 11.5–15.5)
WBC: 7.2 10*3/uL (ref 4.0–10.5)

## 2013-11-21 LAB — COMPREHENSIVE METABOLIC PANEL
ALK PHOS: 91 U/L (ref 39–117)
ALT: 15 U/L (ref 0–35)
AST: 22 U/L (ref 0–37)
Albumin: 3.3 g/dL — ABNORMAL LOW (ref 3.5–5.2)
BILIRUBIN TOTAL: 0.3 mg/dL (ref 0.3–1.2)
BUN: 14 mg/dL (ref 6–23)
CALCIUM: 10 mg/dL (ref 8.4–10.5)
CHLORIDE: 97 meq/L (ref 96–112)
CO2: 34 meq/L — AB (ref 19–32)
Creatinine, Ser: 0.86 mg/dL (ref 0.50–1.10)
GFR calc Af Amer: 71 mL/min — ABNORMAL LOW (ref >90)
GFR, EST NON AFRICAN AMERICAN: 61 mL/min — AB (ref >90)
Glucose, Bld: 123 mg/dL — ABNORMAL HIGH (ref 70–99)
POTASSIUM: 4 meq/L (ref 3.7–5.3)
SODIUM: 140 meq/L (ref 137–147)
Total Protein: 7.7 g/dL (ref 6.0–8.3)

## 2013-11-21 LAB — PROTIME-INR
INR: 1.53 — ABNORMAL HIGH (ref 0.00–1.49)
Prothrombin Time: 18 seconds — ABNORMAL HIGH (ref 11.6–15.2)

## 2013-11-21 LAB — URINALYSIS, ROUTINE W REFLEX MICROSCOPIC
Bilirubin Urine: NEGATIVE
GLUCOSE, UA: NEGATIVE mg/dL
HGB URINE DIPSTICK: NEGATIVE
KETONES UR: NEGATIVE mg/dL
LEUKOCYTES UA: NEGATIVE
Nitrite: NEGATIVE
PH: 7 (ref 5.0–8.0)
Protein, ur: 30 mg/dL — AB
Specific Gravity, Urine: 1.01 (ref 1.005–1.030)
Urobilinogen, UA: 0.2 mg/dL (ref 0.0–1.0)

## 2013-11-21 LAB — URINE MICROSCOPIC-ADD ON

## 2013-11-21 MED ORDER — TRAMADOL HCL 50 MG PO TABS: 50.0000 mg | ORAL_TABLET | Freq: Four times a day (QID) | ORAL | Status: DC | PRN

## 2013-11-21 MED ORDER — TRAMADOL HCL 50 MG PO TABS
50.0000 mg | ORAL_TABLET | Freq: Once | ORAL | Status: AC
  Administered 2013-11-21: 50 mg via ORAL
  Filled 2013-11-21: qty 1

## 2013-11-21 NOTE — ED Notes (Signed)
Patient here from ems for flank pain on left side, pt reports she called her doctor and she was advised to come here due to hx of PE, pt has not had lasix in over 24 hours because she hurts to move and didn't want to be up and down for bathroom.

## 2013-11-21 NOTE — ED Provider Notes (Signed)
CSN: [id number]     Arrival date & time 11/21/13  1215 History   First MD Initiated Contact with Patient 11/21/13 1236     Chief Complaint  Patient presents with  . Flank Pain     (Consider location/radiation/quality/duration/timing/severity/associated sxs/prior Treatment) Patient is a 80 y.o. female presenting with flank pain. The history is provided by the patient.  Flank Pain Pertinent negatives include no chest pain, no abdominal pain, no headaches and no shortness of breath.  pt c/o left superior/posterior flank pain for the past day. Constant. Moderate. Worse w certain positional changes, turning torso, rotating body. Denies trauma, fall or strain. Pain does not radiate. No associated nv. No fever or chills. Denies cough or uri c/o. No pleuritic pain. No chest pain. No anterior/abdominal pain. No dysuria or hematuria. No hx kidney stone. Is no coumadin, hx PE, denies any abnormal bruising or bleeding. No increased leg swelling or pain.     Past Medical History  Diagnosis Date  . Hypertension   . Diabetes mellitus without complication   . PE (pulmonary embolism)   . Pancreatitis   . CHF (congestive heart failure)   . Cholelithiasis    Past Surgical History  Procedure Laterality Date  . Valve replacement    . Pacemaker placement     History reviewed. No pertinent family history. History  Substance Use Topics  . Smoking status: Never Smoker   . Smokeless tobacco: Never Used  . Alcohol Use: No   OB History   Grav Para Term Preterm Abortions TAB SAB Ect Mult Living                 Review of Systems  Constitutional: Negative for fever and chills.  HENT: Negative for sore throat.   Eyes: Negative for redness.  Respiratory: Negative for cough and shortness of breath.   Cardiovascular: Negative for chest pain and leg swelling.  Gastrointestinal: Negative for vomiting, abdominal pain, diarrhea and constipation.  Genitourinary: Positive for flank pain. Negative for  dysuria and hematuria.  Musculoskeletal: Negative for neck pain and neck stiffness.  Skin: Negative for rash.  Neurological: Negative for weakness, numbness and headaches.  Hematological: Does not bruise/bleed easily.  Psychiatric/Behavioral: Negative for confusion.      Allergies  Review of patient's allergies indicates no known allergies.  Home Medications   Prior to Admission medications   Medication Sig Start Date End Date Taking? Authorizing Provider  acetaminophen (TYLENOL) 325 MG tablet Take 325 mg by mouth every 6 (six) hours as needed (pain).   Yes Historical Provider, MD  aspirin 81 MG tablet Take 81 mg by mouth daily.   Yes Historical Provider, MD  carvedilol (COREG) 25 MG tablet Take 25 mg by mouth 2 (two) times daily with a meal.    Historical Provider, MD  furosemide (LASIX) 40 MG tablet Take 0.5 tablets (20 mg total) by mouth every morning. 09/29/13   Maryann Mikhail, DO  glimepiride (AMARYL) 4 MG tablet Take 2 mg by mouth daily after supper.     Historical Provider, MD  hydrALAZINE (APRESOLINE) 25 MG tablet Take 25 mg by mouth 3 (three) times daily.    Historical Provider, MD  insulin aspart protamine- aspart (NOVOLOG MIX 70/30) (70-30) 100 UNIT/ML injection Inject 28-32 Units into the skin 2 (two) times daily with a meal. 32 units in morning and 28 units in the evening    Historical Provider, MD  warfarin (COUMADIN) 5 MG tablet Take 1 tablet (5 mg total) by mouth  daily. 09/29/13   Maryann Mikhail, DO   BP 160/56  Pulse 79  Temp(Src) 98.7 F (37.1 C) (Oral)  Resp 26  Ht 5\' 2"  (1.575 m)  Wt 219 lb (99.338 kg)  BMI 40.05 kg/m2  SpO2 98% Physical Exam  Nursing note and vitals reviewed. Constitutional: She is oriented to person, place, and time. She appears well-developed and well-nourished. No distress.  HENT:  Mouth/Throat: Oropharynx is clear and moist.  Eyes: Conjunctivae are normal. No scleral icterus.  Neck: Neck supple. No tracheal deviation present.   Cardiovascular: Normal rate, regular rhythm, normal heart sounds and intact distal pulses.   Pulmonary/Chest: Effort normal and breath sounds normal. No respiratory distress. She exhibits no tenderness.  Abdominal: Soft. Normal appearance and bowel sounds are normal. She exhibits no distension and no mass. There is no tenderness. There is no rebound and no guarding.  Genitourinary:  No cva tenderness  Musculoskeletal: She exhibits no edema.  CTLS spine, non tender, aligned, no step off. Left superior/post flank/back tenderness. No sts. No skin changes, erythema, shingles/rash to area.   Neurological: She is alert and oriented to person, place, and time.  Skin: Skin is warm and dry. No rash noted.  Psychiatric: She has a normal mood and affect.    ED Course  Procedures (including critical care time)   Results for orders placed during the hospital encounter of 11/21/13  CBC      Result Value Ref Range   WBC 7.2  4.0 - 10.5 K/uL   RBC 3.80 (*) 3.87 - 5.11 MIL/uL   Hemoglobin 10.2 (*) 12.0 - 15.0 g/dL   HCT 32.7 (*) 36.0 - 46.0 %   MCV 86.1  78.0 - 100.0 fL   MCH 26.8  26.0 - 34.0 pg   MCHC 31.2  30.0 - 36.0 g/dL   RDW 14.1  11.5 - 15.5 %   Platelets 237  150 - 400 K/uL  COMPREHENSIVE METABOLIC PANEL      Result Value Ref Range   Sodium 140  137 - 147 mEq/L   Potassium 4.0  3.7 - 5.3 mEq/L   Chloride 97  96 - 112 mEq/L   CO2 34 (*) 19 - 32 mEq/L   Glucose, Bld 123 (*) 70 - 99 mg/dL   BUN 14  6 - 23 mg/dL   Creatinine, Ser 0.86  0.50 - 1.10 mg/dL   Calcium 10.0  8.4 - 10.5 mg/dL   Total Protein 7.7  6.0 - 8.3 g/dL   Albumin 3.3 (*) 3.5 - 5.2 g/dL   AST 22  0 - 37 U/L   ALT 15  0 - 35 U/L   Alkaline Phosphatase 91  39 - 117 U/L   Total Bilirubin 0.3  0.3 - 1.2 mg/dL   GFR calc non Af Amer 61 (*) >90 mL/min   GFR calc Af Amer 71 (*) >90 mL/min  PROTIME-INR      Result Value Ref Range   Prothrombin Time 18.0 (*) 11.6 - 15.2 seconds   INR 1.53 (*) 0.00 - 1.49  URINALYSIS,  ROUTINE W REFLEX MICROSCOPIC      Result Value Ref Range   Color, Urine YELLOW  YELLOW   APPearance CLEAR  CLEAR   Specific Gravity, Urine 1.010  1.005 - 1.030   pH 7.0  5.0 - 8.0   Glucose, UA NEGATIVE  NEGATIVE mg/dL   Hgb urine dipstick NEGATIVE  NEGATIVE   Bilirubin Urine NEGATIVE  NEGATIVE   Ketones, ur NEGATIVE  NEGATIVE mg/dL   Protein, ur 30 (*) NEGATIVE mg/dL   Urobilinogen, UA 0.2  0.0 - 1.0 mg/dL   Nitrite NEGATIVE  NEGATIVE   Leukocytes, UA NEGATIVE  NEGATIVE  URINE MICROSCOPIC-ADD ON      Result Value Ref Range   Squamous Epithelial / LPF RARE  RARE   WBC, UA 0-2  <3 WBC/hpf   RBC / HPF 0-2  <3 RBC/hpf   Bacteria, UA FEW (*) RARE   Ct Abdomen Pelvis Wo Contrast  11/21/2013   CLINICAL DATA:  Left flank pain.  EXAM: CT ABDOMEN AND PELVIS WITHOUT CONTRAST  TECHNIQUE: Multidetector CT imaging of the abdomen and pelvis was performed following the standard protocol without IV contrast.  COMPARISON:  CT chest 09/23/2013.  FINDINGS: Lung bases show dependent atelectasis in the left lower lobe. Heart is enlarged. No pericardial effusion. Small hiatal hernia. Pre pericardiac or juxta diaphragmatic lymph node measures 11 mm, as before.  Liver, gallbladder and right adrenal gland are unremarkable. A 1.4 x 1.5 cm nodule in the left adrenal gland measures 16 Hounsfield units. Kidneys, spleen, pancreas, stomach and small bowel are unremarkable. A fair amount of stool is seen in the ascending and transverse colon.  Numerous calcified lesions in the uterus are indicative of fibroids. Ovaries are visualized. Bladder is grossly unremarkable. Small bilateral inguinal hernias contain fat. Atherosclerotic calcification of the arterial vasculature without abdominal aortic aneurysm. No pathologically enlarged lymph nodes. No free fluid. No worrisome lytic or sclerotic lesions. Degenerative changes are seen in the spine.  IMPRESSION: 1. No findings to explain the patient's left flank pain. 2. Left adrenal  adenoma. 3. Fair amount of stool in the colon is indicative of constipation. 4. Small bilateral inguinal hernias contain fat.   Electronically Signed   By: Lorin Picket M.D.   On: 11/21/2013 15:44   Dg Chest 2 View  11/21/2013   CLINICAL DATA:  LEFT side chest pain, history hypertension, diabetes, CHF, pulmonary embolism, pancreatitis  EXAM: CHEST  2 VIEW  COMPARISON:  09/24/2013  FINDINGS: LEFT subclavian transvenous pacemaker leads project at RIGHT atrium and RIGHT ventricle.  Enlargement of cardiac silhouette post median sternotomy.  Rotated to the RIGHT.  Mild pulmonary vascular congestion.  Mediastinal contours stable.  No acute failure or consolidation.  No pleural effusion or pneumothorax.  Bones appear demineralized.  IMPRESSION: Enlargement of cardiac silhouette with mild pulmonary vascular congestion post median sternotomy and pacemaker.  No acute abnormalities.   Electronically Signed   By: Lavonia Dana M.D.   On: 11/21/2013 13:34      MDM   Iv ns. Labs. Cxr.  Reviewed nursing notes and prior charts for additional history.   Ultram po.  On recheck pts pain appears most c/w musculoskeletal in etiology, noted to be reproduced/worse w certain positional changes, turning torso, palp. No skin changes, erythema, rash or shingles. No cp or pleuritic pain. Chest cta. abd soft nt. Spine nt  Afeb.  Pt appears stable for d/c.   rec close outpt/pcp follow up.  Pt to contact pcp today or in am to discuss INR and possible med adjusstment.       Mirna Mires, MD 11/21/13 [phone]

## 2013-11-21 NOTE — Discharge Instructions (Signed)
Take ultram as need for pain - no driving for the next 4 hours, or if/when taking ultram. Follow up with your doctor in the next few days for recheck. Return to ER if worse, new symptoms, fevers, worsening or severe pain, chest pain, trouble breathing, other concern.  Your ct scan was read as showing no acute process.  Incidental note was made of bilateral inguinal hernias, and possibly mild constipation.  Your INR (measure coumadin level) is 1.5 today - contact your doctor today, or tomorrow morning, to discuss this result and possible adjustment in coumadin dose.       Flank Pain Flank pain refers to pain that is located on the side of the body between the upper abdomen and the back. The pain may occur over a short period of time (acute) or may be long-term or reoccurring (chronic). It may be mild or severe. Flank pain can be caused by many things. CAUSES  Some of the more common causes of flank pain include:  Muscle strains.   Muscle spasms.   A disease of your spine (vertebral disk disease).   A lung infection (pneumonia).   Fluid around your lungs (pulmonary edema).   A kidney infection.   Kidney stones.   A very painful skin rash caused by the chickenpox virus (shingles).   Gallbladder disease.  [place] care will depend on the cause of your pain. In general,  Rest as directed by your caregiver.  Drink enough fluids to keep your urine clear or pale yellow.  Only take over-the-counter or prescription medicines as directed by your caregiver. Some medicines may help relieve the pain.  Tell your caregiver about any changes in your pain.  Follow up with your caregiver as directed. SEEK IMMEDIATE MEDICAL CARE IF:   Your pain is not controlled with medicine.   You have new or worsening symptoms.  Your pain increases.   You have abdominal pain.   You have shortness of breath.   You have persistent nausea or vomiting.   You  have swelling in your abdomen.   You feel faint or pass out.   You have blood in your urine.  You have a fever or persistent symptoms for more than 2 3 days.  You have a fever and your symptoms suddenly get worse. MAKE SURE YOU:   Understand these instructions.  Will watch your condition.  Will get help right away if you are not doing well or get worse. Document Released: 08/14/2005 Document Revised: 03/17/2012 Document Reviewed: 02/05/2012 [facility] Patient Information 2014 [place].   Back Pain, Adult Low back pain is very common. About 1 in 5 people have back pain.The cause of low back pain is rarely dangerous. The pain often gets better over time.About half of people with a sudden onset of back pain feel better in just 2 weeks. About 8 in 10 people feel better by 6 weeks.  CAUSES Some common causes of back pain include:  Strain of the muscles or ligaments supporting the spine.  Wear and tear (degeneration) of the spinal discs.  Arthritis.  Direct injury to the back. DIAGNOSIS Most of the time, the direct cause of low back pain is not known.However, back pain can be treated effectively even when the exact cause of the pain is unknown.Answering your caregiver's questions about your overall health and symptoms is one of the most accurate ways to make sure the cause of your pain is not dangerous. If your caregiver needs  more information, he or she may order lab work or imaging tests (X-rays or MRIs).However, even if imaging tests show changes in your back, this usually does not require surgery. HOME CARE INSTRUCTIONS For many people, back pain returns.Since low back pain is rarely dangerous, it is often a condition that people can learn to [facility] their own.   Remain active. It is stressful on the back to sit or stand in one place. Do not sit, drive, or stand in one place for more than 30 minutes at a time. Take short walks on level surfaces as soon as pain  allows.Try to increase the length of time you walk each day.  Do not stay in bed.Resting more than 1 or 2 days can delay your recovery.  Do not avoid exercise or work.Your body is made to move.It is not dangerous to be active, even though your back may hurt.Your back will likely heal faster if you return to being active before your pain is gone.  Pay attention to your body when you bend and lift. Many people have less discomfortwhen lifting if they bend their knees, keep the load close to their bodies,and avoid twisting. Often, the most comfortable positions are those that put less stress on your recovering back.  Find a comfortable position to sleep. Use a firm mattress and lie on your side with your knees slightly bent. If you lie on your back, put a pillow under your knees.  Only take over-the-counter or prescription medicines as directed by your caregiver. Over-the-counter medicines to reduce pain and inflammation are often the most helpful.Your caregiver may prescribe muscle relaxant drugs.These medicines help dull your pain so you can more quickly return to your normal activities and healthy exercise.  Put ice on the injured area.  Put ice in a plastic bag.  Place a towel between your skin and the bag.  Leave the ice on for 15-20 minutes, 03-04 times a day for the first 2 to 3 days. After that, ice and heat may be alternated to reduce pain and spasms.  Ask your caregiver about trying back exercises and gentle massage. This may be of some benefit.  Avoid feeling anxious or stressed.Stress increases muscle tension and can worsen back pain.It is important to recognize when you are anxious or stressed and learn ways to manage it.Exercise is a great option. SEEK MEDICAL CARE IF:  You have pain that is not relieved with rest or medicine.  You have pain that does not improve in 1 week.  You have new symptoms.  You are generally not feeling well. SEEK IMMEDIATE MEDICAL CARE  IF:   You have pain that radiates from your back into your legs.  You develop new bowel or bladder control problems.  You have unusual weakness or numbness in your arms or legs.  You develop nausea or vomiting.  You develop abdominal pain.  You feel faint. Document Released: 06/23/2005 Document Revised: 12/23/2011 Document Reviewed: 11/11/2010 [facility] Patient Information 2014 [place], [place].    Hernia A hernia occurs when an internal organ pushes out through a weak spot in the abdominal wall. Hernias most commonly occur in the groin and around the navel. Hernias often can be pushed back into place (reduced). Most hernias tend to get worse over time. Some abdominal hernias can get stuck in the opening (irreducible or incarcerated hernia) and cannot be reduced. An irreducible abdominal hernia which is tightly squeezed into the opening is at risk for impaired blood supply (strangulated hernia). A  strangulated hernia is a medical emergency. Because of the risk for an irreducible or strangulated hernia, surgery may be recommended to repair a hernia. CAUSES   Heavy lifting.  Prolonged coughing.  Straining to have a bowel movement.  A cut (incision) made during an abdominal surgery. HOME CARE INSTRUCTIONS   Bed rest is not required. You may continue your normal activities.  Avoid lifting more than 10 pounds (4.5 kg) or straining.  Cough gently. If you are a smoker it is best to stop. Even the best hernia repair can break down with the continual strain of coughing. Even if you do not have your hernia repaired, a cough will continue to aggravate the problem.  Do not wear anything tight over your hernia. Do not try to keep it in with an outside bandage or truss. These can damage abdominal contents if they are trapped within the hernia sac.  Eat a normal diet.  Avoid constipation. Straining over long periods of time will increase hernia size and encourage breakdown of repairs. If you  cannot do this with diet alone, stool softeners may be used. SEEK IMMEDIATE MEDICAL CARE IF:   You have a fever.  You develop increasing abdominal pain.  You feel nauseous or vomit.  Your hernia is stuck outside the abdomen, looks discolored, feels hard, or is tender.  You have any changes in your bowel habits or in the hernia that are unusual for you.  You have increased pain or swelling around the hernia.  You cannot push the hernia back in place by applying gentle pressure while lying down. MAKE SURE YOU:   Understand these instructions.  Will watch your condition.  Will get help right away if you are not doing well or get worse. Document Released: 06/23/2005 Document Revised: 09/15/2011 Document Reviewed: 02/10/2008 [facility] Patient Information 2014 [place].    Constipation, Adult Constipation is when a person has fewer than 3 bowel movements a week; has difficulty having a bowel movement; or has stools that are dry, hard, or larger than normal. As people grow older, constipation is more common. If you try to fix constipation with medicines that make you have a bowel movement (laxatives), the problem may get worse. Long-term laxative use may cause the muscles of the colon to become weak. A low-fiber diet, not taking in enough fluids, and taking certain medicines may make constipation worse. CAUSES   Certain medicines, such as antidepressants, pain medicine, iron supplements, antacids, and water pills.   Certain diseases, such as diabetes, irritable bowel syndrome (IBS), thyroid disease, or depression.   Not drinking enough water.   Not eating enough fiber-rich foods.   Stress or travel.  Lack of physical activity or exercise.  Not going to the restroom when there is the urge to have a bowel movement.  Ignoring the urge to have a bowel movement.  Using laxatives too much. SYMPTOMS   Having fewer than 3 bowel movements a week.   Straining to have a  bowel movement.   Having hard, dry, or larger than normal stools.   Feeling full or bloated.   Pain in the lower abdomen.  Not feeling relief after having a bowel movement. DIAGNOSIS  Your caregiver will take a medical history and perform a physical exam. Further testing may be done for severe constipation. Some tests may include:   A barium enema X-ray to examine your rectum, colon, and sometimes, your small intestine.  A sigmoidoscopy to examine your lower colon.  A colonoscopy  to examine your entire colon. TREATMENT  Treatment will depend on the severity of your constipation and what is causing it. Some dietary treatments include drinking more fluids and eating more fiber-rich foods. Lifestyle treatments may include regular exercise. If these diet and lifestyle recommendations do not help, your caregiver may recommend taking over-the-counter laxative medicines to help you have bowel movements. Prescription medicines may be prescribed if over-the-counter medicines do not work.  HOME CARE INSTRUCTIONS   Increase dietary fiber in your diet, such as fruits, vegetables, whole grains, and beans. Limit high-fat and processed sugars in your diet, such as [place] fries, hamburgers, cookies, candies, and soda.   A fiber supplement may be added to your diet if you cannot get enough fiber from foods.   Drink enough fluids to keep your urine clear or pale yellow.   Exercise regularly or as directed by your caregiver.   Go to the restroom when you have the urge to go. Do not hold it.  Only take medicines as directed by your caregiver. Do not take other medicines for constipation without talking to your caregiver first. [place] IF:   You have bright red blood in your stool.   Your constipation lasts for more than 4 days or gets worse.   You have abdominal or rectal pain.   You have thin, pencil-like stools.  You have unexplained weight loss. MAKE SURE YOU:     Understand these instructions.  Will watch your condition.  Will get help right away if you are not doing well or get worse. Document Released: 03/21/2004 Document Revised: 09/15/2011 Document Reviewed: 04/04/2013 [facility] Patient Information 2014 [place], [place].

## 2013-11-22 DIAGNOSIS — I509 Heart failure, unspecified: Secondary | ICD-10-CM | POA: Diagnosis not present

## 2013-11-22 DIAGNOSIS — E119 Type 2 diabetes mellitus without complications: Secondary | ICD-10-CM | POA: Diagnosis not present

## 2013-11-24 DIAGNOSIS — G4730 Sleep apnea, unspecified: Secondary | ICD-10-CM | POA: Diagnosis not present

## 2013-11-24 DIAGNOSIS — E1059 Type 1 diabetes mellitus with other circulatory complications: Secondary | ICD-10-CM | POA: Diagnosis not present

## 2013-11-24 DIAGNOSIS — E782 Mixed hyperlipidemia: Secondary | ICD-10-CM | POA: Diagnosis not present

## 2013-11-24 DIAGNOSIS — Z7901 Long term (current) use of anticoagulants: Secondary | ICD-10-CM | POA: Diagnosis not present

## 2013-11-24 DIAGNOSIS — G4710 Hypersomnia, unspecified: Secondary | ICD-10-CM | POA: Diagnosis not present

## 2013-11-24 DIAGNOSIS — I2699 Other pulmonary embolism without acute cor pulmonale: Secondary | ICD-10-CM | POA: Diagnosis not present

## 2013-11-30 DIAGNOSIS — I509 Heart failure, unspecified: Secondary | ICD-10-CM | POA: Diagnosis not present

## 2013-11-30 DIAGNOSIS — E119 Type 2 diabetes mellitus without complications: Secondary | ICD-10-CM | POA: Diagnosis not present

## 2013-12-06 DIAGNOSIS — E119 Type 2 diabetes mellitus without complications: Secondary | ICD-10-CM | POA: Diagnosis not present

## 2013-12-06 DIAGNOSIS — I509 Heart failure, unspecified: Secondary | ICD-10-CM | POA: Diagnosis not present

## 2013-12-13 DIAGNOSIS — E119 Type 2 diabetes mellitus without complications: Secondary | ICD-10-CM | POA: Diagnosis not present

## 2013-12-13 DIAGNOSIS — I509 Heart failure, unspecified: Secondary | ICD-10-CM | POA: Diagnosis not present

## 2013-12-22 DIAGNOSIS — I509 Heart failure, unspecified: Secondary | ICD-10-CM | POA: Diagnosis not present

## 2013-12-22 DIAGNOSIS — E119 Type 2 diabetes mellitus without complications: Secondary | ICD-10-CM | POA: Diagnosis not present

## 2014-01-03 DIAGNOSIS — IMO0002 Reserved for concepts with insufficient information to code with codable children: Secondary | ICD-10-CM | POA: Diagnosis not present

## 2014-01-03 DIAGNOSIS — R262 Difficulty in walking, not elsewhere classified: Secondary | ICD-10-CM | POA: Diagnosis not present

## 2014-01-03 DIAGNOSIS — I2782 Chronic pulmonary embolism: Secondary | ICD-10-CM | POA: Diagnosis not present

## 2014-01-03 DIAGNOSIS — E1169 Type 2 diabetes mellitus with other specified complication: Secondary | ICD-10-CM | POA: Diagnosis not present

## 2014-01-03 DIAGNOSIS — M1710 Unilateral primary osteoarthritis, unspecified knee: Secondary | ICD-10-CM | POA: Diagnosis not present

## 2014-01-03 DIAGNOSIS — Z79899 Other long term (current) drug therapy: Secondary | ICD-10-CM | POA: Diagnosis not present

## 2014-01-03 DIAGNOSIS — Z7901 Long term (current) use of anticoagulants: Secondary | ICD-10-CM | POA: Diagnosis not present

## 2014-01-03 DIAGNOSIS — I10 Essential (primary) hypertension: Secondary | ICD-10-CM | POA: Diagnosis not present

## 2014-01-09 ENCOUNTER — Ambulatory Visit (INDEPENDENT_AMBULATORY_CARE_PROVIDER_SITE_OTHER): Payer: Medicare Other | Admitting: Podiatry

## 2014-01-09 VITALS — BP 145/71 | HR 91 | Resp 16

## 2014-01-09 DIAGNOSIS — M79609 Pain in unspecified limb: Secondary | ICD-10-CM | POA: Diagnosis not present

## 2014-01-09 DIAGNOSIS — M79676 Pain in unspecified toe(s): Secondary | ICD-10-CM

## 2014-01-09 DIAGNOSIS — B351 Tinea unguium: Secondary | ICD-10-CM

## 2014-01-09 NOTE — Progress Notes (Signed)
She presents today with a chief complaint of painful elongated toenails and calluses to the plantar aspect of her bilateral foot and his diabetic. ° °Objective: Pulses are palpable bilateral. Nails are thick yellow dystrophic onychomycotic and painful palpation. Reactive hyperkeratosis noted bilateral. ° °Assessment: Pain in limb secondary to onychomycosis diabetes mellitus and porokeratosis. ° °Plan: Debridement of painful benign lesions plantar aspect of the bilateral foot debridement of nails 1 through 5 bilateral is cover service secondary to pain and diabetes. °

## 2014-01-30 DIAGNOSIS — R0602 Shortness of breath: Secondary | ICD-10-CM | POA: Diagnosis not present

## 2014-01-30 DIAGNOSIS — I2699 Other pulmonary embolism without acute cor pulmonale: Secondary | ICD-10-CM | POA: Diagnosis not present

## 2014-01-30 DIAGNOSIS — G4730 Sleep apnea, unspecified: Secondary | ICD-10-CM | POA: Diagnosis not present

## 2014-01-30 DIAGNOSIS — G4710 Hypersomnia, unspecified: Secondary | ICD-10-CM | POA: Diagnosis not present

## 2014-02-02 DIAGNOSIS — E1059 Type 1 diabetes mellitus with other circulatory complications: Secondary | ICD-10-CM | POA: Diagnosis not present

## 2014-02-02 DIAGNOSIS — Z79899 Other long term (current) drug therapy: Secondary | ICD-10-CM | POA: Diagnosis not present

## 2014-02-02 DIAGNOSIS — I110 Hypertensive heart disease with heart failure: Secondary | ICD-10-CM | POA: Diagnosis not present

## 2014-02-02 DIAGNOSIS — I2782 Chronic pulmonary embolism: Secondary | ICD-10-CM | POA: Diagnosis not present

## 2014-02-02 DIAGNOSIS — N182 Chronic kidney disease, stage 2 (mild): Secondary | ICD-10-CM | POA: Diagnosis not present

## 2014-02-02 DIAGNOSIS — Z7901 Long term (current) use of anticoagulants: Secondary | ICD-10-CM | POA: Diagnosis not present

## 2014-02-02 DIAGNOSIS — I5033 Acute on chronic diastolic (congestive) heart failure: Secondary | ICD-10-CM | POA: Diagnosis not present

## 2014-02-07 DIAGNOSIS — I442 Atrioventricular block, complete: Secondary | ICD-10-CM | POA: Diagnosis not present

## 2014-03-14 DIAGNOSIS — I509 Heart failure, unspecified: Secondary | ICD-10-CM | POA: Diagnosis not present

## 2014-03-14 DIAGNOSIS — I110 Hypertensive heart disease with heart failure: Secondary | ICD-10-CM | POA: Diagnosis not present

## 2014-03-14 DIAGNOSIS — E1059 Type 1 diabetes mellitus with other circulatory complications: Secondary | ICD-10-CM | POA: Diagnosis not present

## 2014-03-14 DIAGNOSIS — I2782 Chronic pulmonary embolism: Secondary | ICD-10-CM | POA: Diagnosis not present

## 2014-03-14 DIAGNOSIS — Z7901 Long term (current) use of anticoagulants: Secondary | ICD-10-CM | POA: Diagnosis not present

## 2014-03-14 DIAGNOSIS — N182 Chronic kidney disease, stage 2 (mild): Secondary | ICD-10-CM | POA: Diagnosis not present

## 2014-04-06 DIAGNOSIS — E11351 Type 2 diabetes mellitus with proliferative diabetic retinopathy with macular edema: Secondary | ICD-10-CM | POA: Diagnosis not present

## 2014-04-17 ENCOUNTER — Ambulatory Visit (INDEPENDENT_AMBULATORY_CARE_PROVIDER_SITE_OTHER): Payer: Medicare Other | Admitting: Podiatry

## 2014-04-17 DIAGNOSIS — E109 Type 1 diabetes mellitus without complications: Secondary | ICD-10-CM | POA: Diagnosis not present

## 2014-04-17 DIAGNOSIS — B351 Tinea unguium: Secondary | ICD-10-CM | POA: Diagnosis not present

## 2014-04-17 DIAGNOSIS — M79676 Pain in unspecified toe(s): Secondary | ICD-10-CM

## 2014-04-17 DIAGNOSIS — I10 Essential (primary) hypertension: Secondary | ICD-10-CM | POA: Diagnosis not present

## 2014-04-17 DIAGNOSIS — E782 Mixed hyperlipidemia: Secondary | ICD-10-CM | POA: Diagnosis not present

## 2014-04-17 DIAGNOSIS — E1065 Type 1 diabetes mellitus with hyperglycemia: Secondary | ICD-10-CM | POA: Diagnosis not present

## 2014-04-17 DIAGNOSIS — Z79899 Other long term (current) drug therapy: Secondary | ICD-10-CM | POA: Diagnosis not present

## 2014-04-17 NOTE — Progress Notes (Signed)
Presents today chief complaint of painful elongated toenails.  Objective: Pulses are palpable bilateral nails are thick, yellow dystrophic onychomycosis and painful palpation.   Assessment: Onychomycosis with pain in limb.  Plan: Treatment of nails in thickness and length as covered service secondary to pain.  

## 2014-04-27 DIAGNOSIS — I420 Dilated cardiomyopathy: Secondary | ICD-10-CM | POA: Diagnosis not present

## 2014-04-27 DIAGNOSIS — Z7901 Long term (current) use of anticoagulants: Secondary | ICD-10-CM | POA: Diagnosis not present

## 2014-04-27 DIAGNOSIS — E1165 Type 2 diabetes mellitus with hyperglycemia: Secondary | ICD-10-CM | POA: Diagnosis not present

## 2014-04-27 DIAGNOSIS — I480 Paroxysmal atrial fibrillation: Secondary | ICD-10-CM | POA: Diagnosis not present

## 2014-04-27 DIAGNOSIS — I351 Nonrheumatic aortic (valve) insufficiency: Secondary | ICD-10-CM | POA: Diagnosis not present

## 2014-04-27 DIAGNOSIS — E782 Mixed hyperlipidemia: Secondary | ICD-10-CM | POA: Diagnosis not present

## 2014-04-27 DIAGNOSIS — E6609 Other obesity due to excess calories: Secondary | ICD-10-CM | POA: Diagnosis not present

## 2014-06-08 DIAGNOSIS — Z7901 Long term (current) use of anticoagulants: Secondary | ICD-10-CM | POA: Diagnosis not present

## 2014-06-08 DIAGNOSIS — E6609 Other obesity due to excess calories: Secondary | ICD-10-CM | POA: Diagnosis not present

## 2014-06-08 DIAGNOSIS — J9691 Respiratory failure, unspecified with hypoxia: Secondary | ICD-10-CM | POA: Diagnosis not present

## 2014-06-08 DIAGNOSIS — I480 Paroxysmal atrial fibrillation: Secondary | ICD-10-CM | POA: Diagnosis not present

## 2014-06-08 DIAGNOSIS — E1165 Type 2 diabetes mellitus with hyperglycemia: Secondary | ICD-10-CM | POA: Diagnosis not present

## 2014-06-08 DIAGNOSIS — E782 Mixed hyperlipidemia: Secondary | ICD-10-CM | POA: Diagnosis not present

## 2014-06-08 DIAGNOSIS — N182 Chronic kidney disease, stage 2 (mild): Secondary | ICD-10-CM | POA: Diagnosis not present

## 2014-06-19 DIAGNOSIS — Z1231 Encounter for screening mammogram for malignant neoplasm of breast: Secondary | ICD-10-CM | POA: Diagnosis not present

## 2014-07-17 ENCOUNTER — Ambulatory Visit: Payer: Medicare Other | Admitting: Podiatry

## 2014-07-17 ENCOUNTER — Ambulatory Visit (INDEPENDENT_AMBULATORY_CARE_PROVIDER_SITE_OTHER): Payer: Medicare Other | Admitting: Podiatry

## 2014-07-17 DIAGNOSIS — M79676 Pain in unspecified toe(s): Secondary | ICD-10-CM

## 2014-07-17 DIAGNOSIS — E6609 Other obesity due to excess calories: Secondary | ICD-10-CM | POA: Diagnosis not present

## 2014-07-17 DIAGNOSIS — E782 Mixed hyperlipidemia: Secondary | ICD-10-CM | POA: Diagnosis not present

## 2014-07-17 DIAGNOSIS — B351 Tinea unguium: Secondary | ICD-10-CM | POA: Diagnosis not present

## 2014-07-17 DIAGNOSIS — N182 Chronic kidney disease, stage 2 (mild): Secondary | ICD-10-CM | POA: Diagnosis not present

## 2014-07-17 DIAGNOSIS — E1165 Type 2 diabetes mellitus with hyperglycemia: Secondary | ICD-10-CM | POA: Diagnosis not present

## 2014-07-17 NOTE — Progress Notes (Signed)
She presents today with a chief complaint of painful elongated toenails and calluses to the plantar aspect of her bilateral foot and his diabetic.  Objective: Pulses are palpable bilateral. Nails are thick yellow dystrophic onychomycotic and painful palpation. Reactive hyperkeratosis noted bilateral.  Assessment: Pain in limb secondary to onychomycosis diabetes mellitus and porokeratosis.  Plan: Debridement of painful benign lesions plantar aspect of the bilateral foot debridement of nails 1 through 5 bilateral is cover service secondary to pain and diabetes.

## 2014-08-08 DIAGNOSIS — J9691 Respiratory failure, unspecified with hypoxia: Secondary | ICD-10-CM | POA: Diagnosis not present

## 2014-08-08 DIAGNOSIS — N182 Chronic kidney disease, stage 2 (mild): Secondary | ICD-10-CM | POA: Diagnosis not present

## 2014-08-08 DIAGNOSIS — I480 Paroxysmal atrial fibrillation: Secondary | ICD-10-CM | POA: Diagnosis not present

## 2014-08-08 DIAGNOSIS — I5033 Acute on chronic diastolic (congestive) heart failure: Secondary | ICD-10-CM | POA: Diagnosis not present

## 2014-08-08 DIAGNOSIS — Z713 Dietary counseling and surveillance: Secondary | ICD-10-CM | POA: Diagnosis not present

## 2014-08-08 DIAGNOSIS — Z7901 Long term (current) use of anticoagulants: Secondary | ICD-10-CM | POA: Diagnosis not present

## 2014-08-08 DIAGNOSIS — E782 Mixed hyperlipidemia: Secondary | ICD-10-CM | POA: Diagnosis not present

## 2014-08-08 DIAGNOSIS — E1165 Type 2 diabetes mellitus with hyperglycemia: Secondary | ICD-10-CM | POA: Diagnosis not present

## 2014-08-15 DIAGNOSIS — I442 Atrioventricular block, complete: Secondary | ICD-10-CM | POA: Diagnosis not present

## 2014-09-04 DIAGNOSIS — J811 Chronic pulmonary edema: Secondary | ICD-10-CM | POA: Insufficient documentation

## 2014-09-04 DIAGNOSIS — E782 Mixed hyperlipidemia: Secondary | ICD-10-CM | POA: Insufficient documentation

## 2014-10-16 ENCOUNTER — Ambulatory Visit: Payer: Medicare Other

## 2014-10-18 ENCOUNTER — Ambulatory Visit (INDEPENDENT_AMBULATORY_CARE_PROVIDER_SITE_OTHER): Payer: Medicare Other | Admitting: Podiatry

## 2014-10-18 DIAGNOSIS — M79676 Pain in unspecified toe(s): Secondary | ICD-10-CM

## 2014-10-18 DIAGNOSIS — B351 Tinea unguium: Secondary | ICD-10-CM

## 2014-10-18 NOTE — Progress Notes (Signed)
She presents today with a chief complaint of painful elongated toenails and calluses to the plantar aspect of her bilateral foot and his diabetic.  Objective: Pulses are palpable bilateral. Nails are thick yellow dystrophic onychomycotic and painful palpation. Reactive hyperkeratosis noted bilateral.  Assessment: Pain in limb secondary to onychomycosis diabetes mellitus and porokeratosis.  Plan: Debridement of painful benign lesions plantar aspect of the bilateral foot debridement of nails 1 through 5 bilateral is cover service secondary to pain and diabetes.

## 2014-10-19 DIAGNOSIS — E782 Mixed hyperlipidemia: Secondary | ICD-10-CM | POA: Diagnosis not present

## 2014-10-19 DIAGNOSIS — E1165 Type 2 diabetes mellitus with hyperglycemia: Secondary | ICD-10-CM | POA: Diagnosis not present

## 2014-10-19 DIAGNOSIS — Z0001 Encounter for general adult medical examination with abnormal findings: Secondary | ICD-10-CM | POA: Diagnosis not present

## 2014-10-19 DIAGNOSIS — I351 Nonrheumatic aortic (valve) insufficiency: Secondary | ICD-10-CM | POA: Diagnosis not present

## 2014-10-19 DIAGNOSIS — I480 Paroxysmal atrial fibrillation: Secondary | ICD-10-CM | POA: Diagnosis not present

## 2014-10-19 DIAGNOSIS — Z7901 Long term (current) use of anticoagulants: Secondary | ICD-10-CM | POA: Diagnosis not present

## 2014-10-19 DIAGNOSIS — E6609 Other obesity due to excess calories: Secondary | ICD-10-CM | POA: Diagnosis not present

## 2014-10-19 DIAGNOSIS — I5033 Acute on chronic diastolic (congestive) heart failure: Secondary | ICD-10-CM | POA: Diagnosis not present

## 2014-10-28 NOTE — H&P (Signed)
PATIENT NAME:  Kimberly Peterson, Kimberly Peterson MR#:  [id number] DATE OF BIRTH:  28-Dec-1929  DATE OF ADMISSION:  09/07/2013  PRIMARY CARE PHYSICIAN:  [name], MD  CHIEF COMPLAINT: Soreness in the stomach.   HISTORY OF PRESENT ILLNESS: This is an 81 year old female who was recently in the hospital for acute congestive heart failure, found to have a chronic pulmonary embolism and sent home on oxygen. She presents with a sore stomach going on for 3 days, not wanting to eat anything, radiating up into her right flank. She has been also short of breath. In the ER, she was found to have a lipase of 5902. There was an ultrasound of the abdomen that was done that showed gallstones but no evidence of acute cholecystitis or biliary dilatation. Hospitalist services were contacted for further evaluation.   PAST MEDICAL HISTORY:  Chronic pulmonary embolism, on Coumadin. Chronic respiratory failure, on oxygen 24/7. Diastolic congestive heart failure. Diabetes. Sleep apnea. Hyperlipidemia. Gastroesophageal reflux disease.   PAST SURGICAL HISTORY: Pacemaker, aortic valve replacement.   ALLERGIES: No known drug allergies.   CURRENT MEDICATIONS: Include Amaryl 4 mg daily, aspirin 81 mg daily, Cardizem CD 240 mg daily, Coreg 25 mg twice a day, Flonase 50 mcg per nasal spray daily, Humulin 70/30, 35 units in the evening and 38 units in the morning; Humulin R sliding scale, hydralazine 25 mg 3 times a day, Klor-Con 20 mEq twice a day, Lasix 1 tablet daily and an extra 1/2 tablet as needed for swelling, vitamin 1000 international units daily, Lotrisone twice a day, Voltaren topical gel as needed for pain, warfarin 4 mg 2 tablets once a day at 11:00 a.m.    SOCIAL HISTORY: No smoking. No alcohol. Family lives with her. Used to work in a factory, Therapist, [employer] and [employer].   FAMILY HISTORY: Mother died of a CVA. Father died of prostate cancer.   REVIEW OF SYSTEMS: CONSTITUTIONAL: Negative for fever, chills or sweats. Weight  fluctuates up and down. Positive for fatigue.  EYES: She does wear glasses.  EARS, NOSE, MOUTH AND THROAT: Positive for runny nose. No sore throat. No difficulty swallowing.  CARDIOVASCULAR: No chest pain. No palpitations.  RESPIRATORY: Positive for shortness of breath.  GASTROINTESTINAL: No appetite. Positive for abdominal pain. No nausea, vomiting. No diarrhea. No constipation. No bright red blood per rectum. No melena.  GENITOURINARY: No burning on urination or hematuria.  MUSCULOSKELETAL: No joint pain or muscle pain.  INTEGUMENT: No rashes or eruptions.  NEUROLOGIC: No fainting or blackouts.  PSYCHIATRIC: No anxiety or depression.  ENDOCRINE: No thyroid problems.  HEMATOLOGIC AND LYMPHATIC: No anemia.   PHYSICAL EXAMINATION: VITAL SIGNS: On presentation included a temperature 98.2, pulse 89, respirations 24, blood pressure 165/71, pulse ox 100% on 2 liters of oxygen.  GENERAL: No respiratory distress, sitting up in bed.  EYES: Conjunctivae and lids normal. Pupils equal, round and reactive to light. Extraocular muscles intact. No nystagmus.  EARS, NOSE, MOUTH AND THROAT: Tympanic membranes: No erythema. Nasal mucosa: No erythema. Throat: No erythema. No exudate seen. Lips and gums: No lesions.  NECK: No JVD. No bruits. No lymphadenopathy. No thyromegaly. No thyroid nodules palpated.  RESPIRATORY:  Lungs clear to auscultation. No use of accessory muscles to breathe. Decreased breath sounds bilaterally. No rhonchi, rales or wheeze heard.  CARDIOVASCULAR: S1, S2 soft. Positive 2 out of 6 systolic ejection murmur. Carotid upstroke 2+ bilaterally. No bruits.  Dorsalis pedis pulses 1+ bilaterally.  Has 3+ edema bilateral lower extremity.  ABDOMEN: Soft. Slight  tenderness in the epigastric area. No organomegaly/splenomegaly. Normoactive bowel sounds. No masses felt.  LYMPHATIC: No lymph nodes in the neck.  MUSCULOSKELETAL: No clubbing, edema or cyanosis.  SKIN: No rashes or lesions seen.   NEUROLOGIC: Cranial nerves II through XII grossly intact. Deep tendon reflexes 1+ bilateral lower extremities.  PSYCHIATRIC: The patient is oriented to person, place and time.   LABORATORY, DIAGNOSTIC AND RADIOLOGICAL DATA:  INR 2.0. Lipase of 5902. Glucose 79, BUN 16, creatinine 1.01, sodium 138, potassium 4.0, chloride 100, CO2 of 36, calcium 8.8. Liver function tests normal range. White blood cell count 8.4, H and H 10.0 and 33.0, platelet count of 339. Troponin negative. Abdomen and chest x-ray: Chest COPD, pulmonary fibrosis, superimposed chronic CHF, increased stool burden within the colon may reflect constipation. Urinalysis 3+ leukocyte esterase. Ultrasound of the abdomen showed gallstones, no evidence of acute cholecystitis. EKG is paced at 85 beats per minute. Last admission, triglycerides were 80.   ASSESSMENT AND PLAN: 1.  Acute pancreatitis. The patient does not drink alcohol. On ultrasound, gallstones were seen but no dilated ducts. Potential for gallstones as the cause of the pancreatitis. Triglycerides were 80 on last hospitalization. I do not believe that this is the cause. Could also be medications, I know Lasix can sometimes do it. We will give 1 liter of IV fluids over 50 mL per hour and reassess in the a.m. on more fluids if the patient is able to tolerate with her history of congestive heart failure and shortness of breath. We will hold the patient's Lasix for right now.  2.  Chronic respiratory failure, on 2 liters of oxygen.  3.  Chronic diastolic congestive heart failure, currently no signs of heart failure. Will have to hold Lasix at this time and give gentle fluid. Monitor tomorrow morning for signs of congestive heart failure.  4.  Obesity and sleep apnea. We will put on bilevel positive airway pressure automated overnight. The patient did have a sleep study as outpatient.  5.  Hypertension. Blood pressure borderline right now. Continue to monitor.  6.  Diabetes. Will put on  sliding scale since the patient will be n.p.o.  7.  The patient is a full code.   TIME SPENT ON ADMISSION: 55 minutes.    ____________________________ Tana Conch. Leslye Peer, MD rjw:cs D: 09/07/2013 18:39:04 ET T: 09/07/2013 18:55:59 ET JOB#: [id number]  cc: Tana Conch. Leslye Peer, MD, <Dictator> [name], MD Marisue Brooklyn MD ELECTRONICALLY SIGNED 09/15/2013 16:20

## 2014-10-28 NOTE — Discharge Summary (Signed)
PATIENT NAME:  Kimberly Peterson, Kimberly Peterson MR#:  [id number] DATE OF BIRTH:  July 27, 1929  DATE OF ADMISSION:  09/07/2013 DATE OF DISCHARGE:  09/11/2013  PRESENTING COMPLAINT: Abdominal pain.   DISCHARGE DIAGNOSES: 1. Acute gallstone induced pancreatitis.  2. Hypertension.  3. Chronic anticoagulation due to a history of pulmonary embolism.  4. Type 2 diabetes.  5. Hypertension.  6. Urinary tract infection.   CONDITION ON DISCHARGE: Fair.   CODE STATUS: FULL CODE.   MEDICATIONS: 1. Hydralazine 25 mg 3 times a day.  2. Aspirin 81 mg daily.  3. Coreg 25 mg b.i.d.  4. Cardizem CD 240 p.o. daily.  5. Lotrisone 0.5% topical cream apply to affected area twice a day.  6. Humulin R per your sliding scale.  7. Humulin 70/30,  38 units in the morning, 35 units evening.  8. Amaryl 4 mg daily.  9. Flonase 50 mcg one spray daily.  10. Voltaren apply to right knee 2 times a day as needed.  11. Lasix 40 mg half tablet, that is 20 mg once a day and extra dose as needed.  12. Vitamin D3 1000 international units daily.  13. Klor-Con 20 extended release p.o. b.i.d.  14. Lasix 40 mg p.o. daily.  15. Warfarin 4 mg p.o. daily.  16. Cefuroxime 250 p.o. b.i.d.   FOLLOWUP:  1. Follow up with Dr. Clayborn Bigness in 1 to 2 weeks.  2. Follow up with Dr. Pat Patrick in 2 to 4 weeks.   DIET: Low-sodium, carbohydrate-controlled diet.   Get PT and INR checked by Tuesday of next week at Dr. Etta Quill office.   SURGICAL CONSULTATION: Dr. Pat Patrick  LABORATORY DATA: At discharge:  1. PT/INR is 31.1 and INR is 3.1. White count is 6.8, H and H is 9.9 and 30.59.  2. Basic metabolic panel within normal limits.  3. Lipase was 1373.  4. LFTs within normal limits.  5. CEA was 3.8 within normal limits.  6.CA 19-9 wnl. 7. Ultrasound of the abdomen shows cholelithiasis. Small bilateral pleural effusions. Urinalysis positive for urinary tract infection, Escherichia coli.   BRIEF SUMMARY OF HOSPITAL COURSE: Kimberly Peterson is a very pleasant  81 year old African American female with morbid obesity and multiple medical problems who comes in with:  1. Acute pancreatitis suspected biliary due to gallstones. Triglycerides were 80. The patient was started on IV fluids, kept n.p.o. initially. Her lipase was trending down. Dr. Pat Patrick evaluated the patient and treating the patient conservatively since the patient has severe comorbidities and is a very high risk for surgery at this time. The patient was tolerating clear liquids. Prior to discharge, her lipase came down to 1300, and after discussing with the patient and the patient's daughter, decision was treat patient conservatively at this time. The daughter agrees with the plan. The patient tolerated regular diet prior to discharge.  2. Urinary tract infection Escherichia coli. The patient will finish up the antibiotics with cefuroxime.  3. Chronic obstructive pulmonary disease, on 2 liters.  4. Chronic respiratory failure, on 2 L oxygen, stable.  5. Chronic diastolic congestive heart failure. No signs of heart failure. Resume Lasix at discharge.  6. Obesity with sleep apnea: Continue CPAP.  7. History of PE with chronic anticoagulation. The patient's Coumadin was resumed.  Her INR was 2.1. The dose was decreased to 4 mg. The patient will get INR checked at Dr. Laurelyn Sickle office. 8. Type 2 diabetes. Insulin and Amaryl were continued.  9. Hypertension, p.r.n. hydralazine.   The patient was also  resumed back on her Coreg and Cardizem. Hospital stay otherwise remained stable. She remained a FULL CODE.   TIME SPENT: 40 minutes.   ____________________________ Hart Rochester Posey Pronto, MD sap:sg D: 09/13/2013 12:37:00 ET T: 09/13/2013 13:13:50 ET JOB#: [id number]  cc: Margot Oriordan A. Posey Pronto, MD, <Dictator> Ilda Basset MD ELECTRONICALLY SIGNED 09/13/2013 15:27

## 2014-10-28 NOTE — Consult Note (Signed)
CHIEF COMPLAINT and HISTORY:  Subjective/Chief Complaint Admitted with PE Consulted for central line   History of Present Illness 81 year old admitted 2/11 with pulmonary embolism/resp failure/chf. Reports breathing has improved some today.  Having a lot of difficulty with getting blood draws to check PTT. one 20g IV in right arm (which needs rotated per nursing). She is on heparin gtt and will be on it for a few more days at least while getting therapeutic on coumadin which is why we were asked to place a central line.   PAST MEDICAL/SURGICAL HISTORY:  Past Medical History:   gerd:    obesity:    type 2 diabetes:    htn:    critical aortic stenosis:    chf:    pacemaker:    aortic valve replacement:   ALLERGIES:  Allergies:  No Known Allergies:   HOME MEDICATIONS:  Home Medications: Medication Instructions Status  carvedilol 25 mg tablet 1 tab(s) orally 2 times a day  Active  Aspirin Low Dose 81 mg oral delayed release tablet 1 tab(s) orally once a day Active  diltiazem 240 mg/24 hours oral capsule, extended release 1 cap(s) orally once a day Active  furosemide 40 mg oral tablet 1 tab(s) orally once a day Active  hydrALAZINE 25 mg oral tablet 1 tab(s) orally 3 times a day Active  glimepiride 4 mg oral tablet 1 tab(s) orally once a day (at bedtime) Active  Novolin 70/30 subcutaneous suspension 32 unit(s) subcutaneous once a day (in the morning) and 30 units every evening Active   Family and Social History:  Family History Diabetes Mellitus   Social History negative tobacco, negative ETOH, negative Illicit drugs   Review of Systems:  Fever/Chills No   Cough No   Abdominal Pain No   Diarrhea No   Constipation No   Nausea/Vomiting No   SOB/DOE Yes   Chest Pain No   Tolerating Diet Yes   Medications/Allergies Reviewed Medications/Allergies reviewed   Physical Exam:  GEN no acute distress, obese   HEENT PERRL, hearing intact to voice, moist oral  mucosa   NECK supple  No masses   RESP normal resp effort  clear BS   CARD regular rate   ABD denies tenderness  soft  normal BS   LYMPH negative neck   EXTR negative cyanosis/clubbing   SKIN normal to palpation, No rashes   NEURO cranial nerves intact   PSYCH alert, A+O to time, place, person   ASSESSMENT AND PLAN:  Plan Multiple medical issues being treated including acute on chronic respiratory failure, pulmonary embolism, CHF, HTN, DMII, obesity and sleep apnea. Difficulty with lab draws which will need to be checked while on heparin drip and getting therapeutic on coumadin. Central line was placed this morning at bedside with Dr. Lucky Cowboy.   Electronic Signatures: Su Grand (PA-C)  (Signed 16-Feb-15 10:05)  Authored: Chief Complaint and History, PAST MEDICAL/SURGICAL HISTORY, ALLERGIES, HOME MEDICATIONS, Family and Social History, Review of Systems, Physical Exam, Assessment and Plan   Last Updated: 16-Feb-15 10:05 by Su Grand (PA-C)

## 2014-10-28 NOTE — H&P (Signed)
PATIENT NAME:  Kimberly Peterson, Kimberly Peterson MR#:  [id number] DATE OF BIRTH:  [birth year]  DATE OF ADMISSION:  08/17/2013  PRIMARY CARE PHYSICIAN: [name], MD  CARDIOLOGIST: Corey Skains, MD  CHIEF COMPLAINT: Shortness of breath.   HISTORY OF PRESENT ILLNESS: This is an 81 year old female who has been having shortness of breath for weeks. She is unable to give much history secondary to shortness of breath. Over the last few weeks, she has canceled appointments with Dr. [name] and the sleep apnea test secondary to shortness of breath and unable to get out of bed. She has been unable to move around very much over the last couple weeks. Family brought her in today because of shortness of breath. She had been refusing to come in prior to this. She has been complaining of a stomachache recently, shortness of breath with cough, also some sweating. No complaints of chest pain. The patient unable to give a history secondary to severe respiratory distress. History obtained from family and old chart.   PAST MEDICAL HISTORY:  1. Congestive heart failure.  2. Diabetes.  3. Sleep apnea.  4. Hyperlipidemia.  5. Gastroesophageal reflux disease.   PAST SURGICAL HISTORY:  1. Pacemaker placement.  2. Aortic valve replacement with a bovine valve for aortic stenosis.   ALLERGIES: No known drug allergies.   MEDICATIONS: As per Prescription Writer, include:  1. Aspirin 81 mg daily.  2. Coreg 25 mg twice a day. 3. Diltiazem 240 mg extended-release once a day.  4. Lasix 40 mg daily.  5. Glimepiride 4 mg daily.  6. Hydralazine 25 mg 3 times a day. 7. Novolin 70/30 32 units subcutaneous injection in the morning and 30 units subcutaneous injection in the evening.   SOCIAL HISTORY: No smoking. No alcohol. No drug use. Family lives with her. Used to work in a [employer].   FAMILY HISTORY: Positive for diabetes.   REVIEW OF SYSTEMS: Difficult to obtain secondary to severe respiratory  distress.   PHYSICAL EXAMINATION:  VITAL SIGNS: On presentation, included blood pressure 202/81, respirations 18, pulse 78, as per ER physician, documented 88% saturation on 2 liters, temperature 98.3.  GENERAL: Positive respiratory distress. The patient sweating, using accessory muscles to breathe.  EYES: Conjunctivae and lids normal. Pupils equal, round and reactive to light. Extraocular muscles intact. No nystagmus.  EARS, NOSE, MOUTH AND THROAT: Tympanic membranes: No erythema. Nasal mucosa: No erythema. Throat: No erythema. No exudate seen. Lips and gums: No lesions.  NECK: No JVD. No bruits. No lymphadenopathy. No thyromegaly. No thyroid nodules palpated.  RESPIRATORY: Lungs: Poor air entry bilaterally. Upper airway expiratory wheeze, rales two-thirds of the way up lung field and decreased breath sounds.  CARDIOVASCULAR: S1, S2 normal. A 2/6 systolic ejection murmur. Carotid upstroke 2+ bilaterally. No bruits. Dorsalis pedis pulses difficult to palpate secondary to 4+ edema of the lower extremity.  ABDOMEN: Soft, nontender. No organo- or splenomegaly. Normoactive bowel sounds. No masses felt.  LYMPHATIC: No lymph nodes in the neck.  MUSCULOSKELETAL: 4+ edema. No clubbing. No cyanosis.  SKIN: No ulcers or lesions seen.  NEUROLOGICAL: Cranial nerves II through XII grossly intact. Deep tendon reflexes difficult to elicit.  PSYCHIATRIC: The patient is alert and oriented.   LABORATORY AND RADIOLOGICAL DATA:  Chest x-ray: Poor air entry consistent with congestive heart failure.  BNP 1532.  White blood cell count 8.3, H and H 11.1 and 36.3, platelet count of 219.  Glucose 58, BUN 20, creatinine 0.91, sodium 138,  chloride 99, CO2 39, calcium 9.1. Liver function tests normal range. Albumin 2.9.  Troponin negative.  EKG: Paced.   ASSESSMENT AND PLAN:  1. Acute respiratory distress and acute respiratory failure. Will give BiPAP in order to oxygenate. The patient is a full code and wishes to be  intubated if needed, but not on a ventilator long term. I will get an ABG in 2 hours after being on BiPAP. I will admit to the ICU. Etiology for the acute respiratory distress could be pulmonary embolism. I will empirically start heparin drip. The patient will be unable to lie flat today for further testing. Most likely, this is congestive heart failure though. I will give another 80 mg of IV Lasix STAT. Continue Coreg and hydralazine and add nitro patch. The patient was given IV enalapril in the Emergency Room. I do not think that this is acute myocardial infarction or aortic dissection. 2. Malignant hypertension, likely secondary to respiratory failure. Continue the patient's usual medications and add nitro paste and continue to monitor.  3. Diabetes with hypoglycemia. Hold oral meds and insulin at this time and do sliding scale. The patient was given an amp of D50 in the Emergency Room.  4. Obesity and sleep apnea. The patient will be put on BiPAP right now and will likely need BiPAP at night.  5. Will obtain an echocardiogram to determine which type of heart failure this is. The patient also has a history of a valve replacement, and the valve needs to be assessed also. Will get a cardiology consultation and pulmonary consultation.   TIME SPENT ON ADMISSION: 55 minutes critical care time.   ____________________________ Tana Conch. Leslye Peer, MD rjw:lb D: 08/17/2013 12:44:25 ET T: 08/17/2013 13:03:21 ET JOB#: [id number]  cc: Tana Conch. Leslye Peer, MD, <Dictator> [name], MD Corey Skains, MD Marisue Brooklyn MD ELECTRONICALLY SIGNED 08/27/2013 14:06

## 2014-10-28 NOTE — Discharge Summary (Signed)
PATIENT NAME:  Kimberly Peterson, Kimberly Peterson MR#:  [id number] DATE OF BIRTH:  July 25, 1929  DATE OF ADMISSION:  08/17/2013 DATE OF DISCHARGE:  08/24/2013  Addendum. This is a continuation to the interim discharge summary dictated by Dr. Vianne Bulls.   DISCHARGE MEDICATIONS: 1.  Carvedilol 25 mg b.i.d.  2.  Aspirin 81 mg daily.  3.  Diltiazem 240 mg p.o. daily.  4.  Lasix 40 mg daily.  5.  Hydralazine 25 mg 3 times a day.  6.  Glimepiride 4 mg p.o. at bedtime.  7.  Novolin 70/30, 32 units in the morning and 30 units every evening.  8.  Lasix 40 mg 1 tablet b.i.d. for 3 days, and then change to once a day.  9.  Warfarin 8 mg daily.   Home health PT and R.N.   PT and INR to be drawn on 02/20, and results faxed to Dr. Clayborn Bigness.   Follow up with Dr. Clayborn Bigness in 1 to 2 weeks.   Followed by Dr. [name] in 1 to 2 weeks.   BRIEF SUMMARY OF HOSPITAL COURSE: As dictated by Dr. Vianne Bulls.  The patient on February 18th remained hemodynamically stable. Her INR was therapeutic at 2. Her heparin drip was discontinued. Overall, her vitals remained stable. Her breathing was 97% on 3 liters. The patient uses chronic home oxygen, which is continued. The patient will continue taking her warfarin as directed.  Coumadin education material was provided to family members. I did speak with Dr. Clayborn Bigness regarding the patient being started on Coumadin and followup PT/INR, which will be faxed to her once labs are drawn by home health on 08/26/2013.  Discharge plan was discussed with the patient's daughter at length. She did voice understanding.   TIME SPENT: 40 minutes.    ____________________________ Hart Rochester Posey Pronto, MD sap:dmm D: 08/25/2013 16:29:43 ET T: 08/25/2013 22:08:18 ET JOB#: [id number]  cc: Trashawn Oquendo A. Posey Pronto, MD, <Dictator> Corey Skains, MD [name], MD Ilda Basset MD ELECTRONICALLY SIGNED 09/04/2013 13:36

## 2014-10-28 NOTE — Op Note (Signed)
PATIENT NAME:  Kimberly Peterson, POSTELL MR#:  [id number] DATE OF BIRTH:  16-Jun-1930  DATE OF PROCEDURE:  08/22/2013  PREOPERATIVE DIAGNOSES: 1. Pulmonary embolus.  2. Obesity.  3. Malignant hypertension.  4. Poor venous access.   POSTOPERATIVE DIAGNOSES:  1. Pulmonary embolus.  2. Obesity.  3. Malignant hypertension.  4. Poor venous access.   PROCEDURES: 1. Ultrasound guidance for vascular access, right jugular vein.  2. Placement of right jugular triple-lumen catheter.   SURGEON: Leotis Pain, MD and [facility].   ANESTHESIA: Local.   ESTIMATED BLOOD LOSS: Minimal.   INDICATION FOR PROCEDURE: An 81 year old African American female who was admitted with pulmonary embolus, shortness of breath and other issues. We are asked to place a central line due to her poor venous access.   DESCRIPTION OF PROCEDURE: The patient is laid flat in her floor bed. Initially, we imaged her left neck, which was found no patent jugular vein.  We then turned our attention to the right neck which showed a large patent jugular vein. We then prepped and draped the right neck. Ultrasound was used to access the right jugular vein without difficulty with a Seldinger needle. A J-wire was then placed after skin and dilatation, the triple lumen catheter was placed over the wire, and the wire was removed. All 3 lumens withdrew blood and flushed well with sterile saline. It was then secured to skin at 19 cm with 3 silk sutures. Stat chest x-ray is pending.       ____________________________ Algernon Huxley, MD jsd:sg D: 08/22/2013 09:49:27 ET T: 08/22/2013 10:21:47 ET JOB#: [id number]  cc: Algernon Huxley, MD, <Dictator> Algernon Huxley MD ELECTRONICALLY SIGNED 09/01/2013 12:00

## 2014-10-28 NOTE — Consult Note (Signed)
CHIEF COMPLAINT and HISTORY:  Subjective/Chief Complaint SOB   History of Present Illness 81 yo AAF with obesity and malignant HTN.  Admitted with worsening SOB.  Found to have PE.  On heparin drip and has poor venous access.  We are asked to place a central line.   PAST MEDICAL/SURGICAL HISTORY:  Past Medical History:   gerd:    obesity:    type 2 diabetes:    htn:    critical aortic stenosis:    chf:    pacemaker:    aortic valve replacement:   ALLERGIES:  Allergies:  No Known Allergies:   HOME MEDICATIONS:  Home Medications: Medication Instructions Status  carvedilol 25 mg tablet 1 tab(s) orally 2 times a day  Active  Aspirin Low Dose 81 mg oral delayed release tablet 1 tab(s) orally once a day Active  diltiazem 240 mg/24 hours oral capsule, extended release 1 cap(s) orally once a day Active  furosemide 40 mg oral tablet 1 tab(s) orally once a day Active  hydrALAZINE 25 mg oral tablet 1 tab(s) orally 3 times a day Active  glimepiride 4 mg oral tablet 1 tab(s) orally once a day (at bedtime) Active  Novolin 70/30 subcutaneous suspension 32 unit(s) subcutaneous once a day (in the morning) and 30 units every evening Active   Family and Social History:  Family History Non-Contributory   Social History negative tobacco, negative ETOH   Review of Systems:  Fever/Chills No   Cough Yes   Sputum Yes   Abdominal Pain No   Diarrhea No   Constipation No   Nausea/Vomiting No   SOB/DOE Yes   Chest Pain Yes   Dysuria No   Tolerating PT Yes   Tolerating Diet Yes   Medications/Allergies Reviewed Medications/Allergies reviewed   Physical Exam:  GEN well developed, well nourished, obese   HEENT hearing intact to voice, moist oral mucosa   NECK No masses  trachea midline   RESP normal resp effort  no use of accessory muscles   CARD regular rate  no JVD   VASCULAR ACCESS none   ABD denies tenderness  normal BS   GU no superpubic tenderness    LYMPH negative neck, negative axillae   EXTR negative cyanosis/clubbing, negative edema   SKIN normal to palpation, skin turgor good   NEURO cranial nerves intact, motor/sensory function intact   PSYCH alert, A+O to time, place, person   LABS:  Laboratory Results: LabObservation:    11-Feb-15 14:13, Echo Doppler  OBSERVATION   Reason for Test    13-Feb-15 11:23, [place] Color Flow Doppler Low Extrem Bilat (Legs)  OBSERVATION   Reason for Test History of DVT  Hepatic:    11-Feb-15 10:45, Comprehensive Metabolic Panel  Bilirubin, Total 0.5  Alkaline Phosphatase 89  45-117  NOTE: New Reference Range  05/27/13  SGPT (ALT) 25  SGOT (AST) 39  Total Protein, Serum 7.7  Albumin, Serum 2.9  Routine Micro:    11-Feb-15 14:09, Urine Culture  Micro Text Report   URINE CULTURE    COMMENT                   NO GROWTH IN 36 HOURS     ANTIBIOTIC  Specimen Source   INDWELLING CATHETER  Culture Comment   NO GROWTH IN 36 HOURS   Result(s) reported on 19 Aug 2013 at 09:21AM.  Lab:    11-Feb-15 14:10, ABG  pH (ABG) 7.33  PCO2 79  PO2 67  FiO2 35  Base Excess 12.1  HCO3 41.7  O2 Saturation 94.4  O2 Device BIPAP  Specimen Site (ABG)   RT RADIAL  Specimen Type (ABG) ARTERIAL  Patient Temp (ABG) 37.0  PSV 12  PEEP 5.0  Mechanical Rate 10  Cardiology:    11-Feb-15 10:25, ED ECG  Ventricular Rate 82  Atrial Rate 82  P-R Interval 138  QRS Duration 140  QT 440  QTc 514  P Axis 43  R Axis -53  T Axis 84  ECG interpretation   Statement Not Found (#[id number])  Abnormal ECG  When compared with ECG of 04-Jul-2010 12:45,  Current undetermined rhythm precludes rhythm comparison, needs review  (RBBB and left anterior fascicular block) is no longer Present  ----------unconfirmed----------  Confirmed by OVERREAD, NOT ([id number]), editor PEARSON, BARBARA ([id number]) on 08/20/2013 9:22:57 AM  ED ECG     11-Feb-15 14:13, Echo Doppler  Echo Doppler   REASON FOR EXAM:      COMMENTS:       PROCEDURE:  [place] - ECHO DOPPLER COMPLETE(TRANSTHOR)  - Aug 17 2013  2:13PM     RESULT: Echocardiogram Report    Patient Name:   Kimberly Peterson Date of Exam: 08/17/2013  Medical Rec #:  [id number]          Custom1:  Date of Birth:  1929-12-07        Height:       62.0 in  Patient Age:    61 years        Weight:       230.0 lb  Patient Gender: F               BSA:          2.03 m??    Indications: CHF  Sonographer:    Sherrie Sport RDCS  Referring Phys: Graciella Freer, H    Sonographer Comments: Technically very difficult study due to very poor   echo windows.    Summary:   1. Left ventricular ejection fraction, by visual estimation, is 50 to   55%.   2. Normal global left ventricular systolic function.  2D AND M-MODE MEASUREMENTS (normal ranges within parentheses):  Left Ventricle:          Normal  IVSd (2D):      0.92 cm (0.7-1.1)  LVPWd (2D):     1.34 cm (0.7-1.1) Aorta/LA:                  Normal  LVIDd (2D):     4.75 cm (3.4-5.7) Aortic Root (2D): 2.10 cm (2.4-3.7)  LVIDs (2D):     3.43 cm           Left Atrium (2D): 3.80 cm (1.9-4.0)  LV FS (2D):     27.8 %   (>25%)  LV EF (2D):     53.8 %   (>50%)                                    Right Ventricle:                                    RVd (2D):        4.33IR  LV DIASTOLIC FUNCTION:  MV Peak E: 1.43 m/s E/e' Ratio: 49.00  MV Peak A: 1.61 m/s Decel Time: 317 msec  E/A  Ratio: 0.89  SPECTRAL DOPPLER ANALYSIS (where applicable):  Mitral Valve:  MV Max Vel:   1.89 m/s MV P1/2 Time: 91.93 msec  MV Mean Grad:6.0 mmHg MV Area, PHT: 2.39 cm??  Aortic Valve: AoV Max Vel: 2.30 m/s AoV Peak PG: 21.2 mmHg AoV Mean PG:   12.0 mmHg  LVOT Vmax: 0.83 m/s LVOT VTI: 0.194 m LVOT Diameter: 2.00 cm  AoV Area, Vmax: 1.13 cm?? AoV Area, VTI: 1.36 cm?? AoV Area, Vmn: 1.17 cm??  Tricuspid Valve and PA/RV Systolic Pressure: TR Max Velocity: 2.72 m/s RA   Pressure: 5 mmHg RVSP/PASP: 34.5 mmHg  Pulmonic Valve:  PV Max Velocity: 0.94 m/s PV Max PG: 3.5 mmHg PV Mean  PG:    PHYSICIAN INTERPRETATION:  Left Ventricle: Not well seen. The left ventricular internal cavity size   was normal. LV septal wall thickness was normal. LV posterior wall   thickness was normal. Global LV systolic function was normal. Left   ventricular ejection fraction, by visual estimation, is 50 to 55%.  Mitral Valve: The mitral valve is not well seen.  Tricuspid Valve: The tricuspid valve is not well seen. The tricuspid   regurgitant velocity is 2.72 m/s, and with an assumed right atrial   pressure of 5 mmHg, the estimated right ventricular systolic pressure is     normal at 34.5 mmHg.  Aortic Valve: The aortic valve was not well seen.    [place] MD  Electronically signed by [id number] Bartholome Bill MD  Signature Date/Time: 08/17/2013/4:40:41 PM    *** Final ***    IMPRESSION: .        Verified By: Teodoro Spray, M.D., MD  Routine Chem:    11-Feb-15 10:45, B-Type Natriuretic Peptide [facility])  B-Type Natriuretic Peptide [facility]) [id number]  Result(s) reported on 17 Aug 2013 at 11:20AM.    11-Feb-15 10:45, Comprehensive Metabolic Panel  Glucose, Serum 58  BUN 20  Creatinine (comp) 0.91  Sodium, Serum 138  Potassium, Serum 4.6  Chloride, Serum 99  CO2, Serum 39  Calcium (Total), Serum 9.1  Osmolality (calc) 276  eGFR (African American) >60  eGFR (Non-African American) 59  eGFR values <78mL/min/1.73 m2 may be an indication of chronic  kidney disease (CKD).  Calculated eGFR is useful in patients with stable renal function.  The eGFR calculation will not be reliable in acutely ill patients  when serum creatinine is changing rapidly. It is not useful in   patients on dialysis. The eGFR calculation may not be applicable  to patients at the low and high extremes of body sizes, pregnant  women, and vegetarians.  Result Comment   POTASSIUM/AST/CKI - Slight hemolysis, interpret results with   - caution.   Result(s) reported on 17 Aug 2013 at 01:09PM.  Anion Gap 0     11-Feb-15 14:10, ABG  Result Comment   - NOTIFIED OF CRITICAL VALUE   - READ-BACK PROCESS PERFORMED.   - GREGG, RN at 1416 on 08/17/13   - by gkm   Result(s) reported on 17 Aug 2013 at 02:19PM.    11-Feb-15 15:27, Troponin I  Result Comment   TROPONIN - RESULTS VERIFIED BY REPEAT TESTING.   - KLS TO GREG MOYER $RemoveBe'@1615'[id number]$  08/17/13   - READ-BACK PROCESS PERFORMED.   Result(s) reported on 17 Aug 2013 at 04:18PM.    11-Feb-15 21:21, Activated PTT  Result Comment   APTT - RESULTS VERIFIED BY REPEAT TESTING.   - NOTIFIED OF CRITICAL VALUE   - C/T KELLY PENDLETON  2227 08-17-13 GAS   - READ-BACK PROCESS PERFORMED.   Result(s) reported on 17 Aug 2013 at 10:30PM.    11-Feb-15 21:21, Troponin I  Result Comment   TROPONIN - RESULTS VERIFIED BY REPEAT TESTING.   - PREVIOUSLY CALLED $RemoveBeforeDEI'@1615'[id number]$  08/17/13 BY KLS   Result(s) reported on 17 Aug 2013 at 10:41PM.    12-Feb-15 00:51, Basic Metabolic Panel (w/Total Calcium)  Glucose, Serum 86  BUN 17  Creatinine (comp) 0.91  Sodium, Serum 139  Potassium, Serum 3.7  Chloride, Serum 95  CO2, Serum 41  Calcium (Total), Serum 9.2  Anion Gap 3  Osmolality (calc) 278  eGFR (African American) >60  eGFR (Non-African American) 59  eGFR values <17mL/min/1.73 m2 may be an indication of chronic  kidney disease (CKD).  Calculated eGFR is useful in patients with stable renal function.  The eGFR calculation will not be reliable in acutely ill patients  when serum creatinine is changing rapidly. It is not useful in   patients on dialysis. The eGFR calculation may not be applicable  to patients at the low and high extremes of body sizes, pregnant  women, and vegetarians.  Result Comment   CO2 - NOTIFIED OF CRITICAL VALUE   - RESULTS VERIFIED BY REPEAT TESTING.   - CALLED TO MARGARET JAMES AT 0147 ON   - 08/18/2013.Marland KitchenMarland KitchenTPL   - READ-BACK PROCESS PERFORMED.   Result(s) reported on 18 Aug 2013 at 01:38AM.    12-Feb-15 00:41, Lipid Profile [facility])  Cholesterol, Serum 160   Triglycerides, Serum 80  HDL (INHOUSE) 68  VLDL Cholesterol Calculated 16  LDL Cholesterol Calculated 76  Result(s) reported on 18 Aug 2013 at 01:26AM.    13-Feb-15 08:03, Activated PTT  Result Comment   APTT - RESULTS VERIFIED BY REPEAT TESTING.   - NOTIFIED OF CRITICAL VALUE   - c/D Glo Herring 08/19/13 at 0908 by hs   - READ-BACK PROCESS PERFORMED.   Result(s) reported on 19 Aug 2013 at 09:08AM.    15-Feb-15 10:21, Basic Metabolic Panel (w/Total Calcium)  Glucose, Serum 127  BUN 24  Creatinine (comp) 1.14  Sodium, Serum 136  Potassium, Serum 4.4  Chloride, Serum 93  CO2, Serum 41  Calcium (Total), Serum 9.0  Anion Gap 2  Osmolality (calc) 278  eGFR (African American) 52  eGFR (Non-African American) 45  eGFR values <71mL/min/1.73 m2 may be an indication of chronic  kidney disease (CKD).  Calculated eGFR is useful in patients with stable renal function.  The eGFR calculation will not be reliable in acutely ill patients  when serum creatinine is changing rapidly. It is not useful in   patients on dialysis. The eGFR calculation may not be applicable  to patients at the low and high extremes of body sizes, pregnant  women, and vegetarians.  Result Comment   CO2 - RESULTS VERIFIED BY REPEAT TESTING.   - NOTIFIED OF CRITICAL VALUE   - DOLL FERGUSON,RN AT 1173 ON 08/21/13.PBP   - READ-BACK PROCESS PERFORMED.   Result(s) reported on 21 Aug 2013 at 08:59AM.  Cardiac:    11-Feb-15 10:45, Cardiac Panel  CK, Total 196  26-192  NOTE: NEW REFERENCE RANGE   08/08/2013  CPK-MB, Serum 2.3  Result(s) reported on 17 Aug 2013 at 11:41AM.    11-Feb-15 10:45, Troponin I  Troponin I 0.02  0.00-0.05  0.05 ng/mL or less: NEGATIVE   Repeat testing in 3-6 hrs   if clinically indicated.  >0.05 ng/mL: POTENTIAL   MYOCARDIAL INJURY. Repeat   testing in  3-6 hrs if   clinically indicated.  NOTE: An increase or decrease   of 30% or more on serial   testing suggests a   clinically important  change    11-Feb-15 15:27, Troponin I  Troponin I 0.10  0.00-0.05  0.05 ng/mL or less: NEGATIVE   Repeat testing in 3-6 hrs   if clinically indicated.  >0.05 ng/mL: POTENTIAL   MYOCARDIAL INJURY. Repeat   testing in 3-6 hrs if   clinically indicated.  NOTE: An increase or decrease   of 30% or more on serial   testing suggests a   clinically important change    11-Feb-15 21:21, Troponin I  Troponin I 0.06  0.00-0.05  0.05 ng/mL or less: NEGATIVE   Repeat testing in 3-6 hrs   if clinically indicated.  >0.05 ng/mL: POTENTIAL   MYOCARDIAL INJURY. Repeat   testing in 3-6 hrs if   clinically indicated.  NOTE: An increase or decrease   of 30% or more on serial   testing suggests a   clinically important change  Routine UA:    11-Feb-15 14:09, Urinalysis  Color (UA)   Colorless  Clarity (UA) Clear  Glucose (UA) Negative  Bilirubin (UA) Negative  Ketones (UA) Negative  Specific Gravity (UA) 1.005  Blood (UA) Negative  pH (UA) 6.0  Protein (UA) Negative  Nitrite (UA) Negative  Leukocyte Esterase (UA) Negative  Result(s) reported on 17 Aug 2013 at 02:30PM.  RBC (UA) 1 /HPF  WBC (UA) <1 /HPF  Bacteria (UA)   NONE SEEN  Epithelial Cells (UA) 1 /HPF  Mucous (UA) PRESENT  Hyaline Cast (UA) 3 /LPF  Result(s) reported on 17 Aug 2013 at 02:30PM.  Routine Coag:    11-Feb-15 10:45, Activated PTT  Activated PTT (APTT) 32.1  A HCT value >55% may artifactually increase the APTT. In one study,  the increase was an average of 19%.  Reference: "Effect on Routine and Special Coagulation Testing Values  of Citrate Anticoagulant Adjustment in Patients with High HCT Values."  American Journal of Clinical Pathology 2006;126:400-405.    11-Feb-15 21:21, Activated PTT  Activated PTT (APTT) > 160.0  A HCT value >55% may artifactually increase the APTT. In one study,  the increase was an average of 19%.  Reference: "Effect on Routine and Special Coagulation Testing Values  of Citrate  Anticoagulant Adjustment in Patients with High HCT Values."  American Journal of Clinical Pathology 2006;126:400-405.    12-Feb-15 00:41, Activated PTT  Activated PTT (APTT) 71.9  A HCT value >55% may artifactually increase the APTT. In one study,  the increase was an average of 19%.  Reference: "Effect on Routine and Special Coagulation Testing Values  of Citrate Anticoagulant Adjustment in Patients with High HCT Values."  American Journal of Clinical Pathology 2006;126:400-405.    12-Feb-15 18:13, Activated PTT  Activated PTT (APTT) 28.8  A HCT value >55% may artifactually increase the APTT. In one study,  the increase was an average of 19%.  Reference: "Effect on Routine and Special Coagulation Testing Values  of Citrate Anticoagulant Adjustment in Patients with High HCT Values."  American Journal of Clinical Pathology 2006;126:400-405.    13-Feb-15 00:43, Activated PTT  Activated PTT (APTT) 27.2  A HCT value >55% may artifactually increase the APTT. In one study,  the increase was an average of 19%.  Reference: "Effect on Routine and Special Coagulation Testing Values  of Citrate Anticoagulant Adjustment in Patients with High HCT Values."  American Journal of Clinical Pathology 2006;126:400-405.  13-Feb-15 04:39, Activated PTT  Activated PTT (APTT) 130.1  A HCT value >55% may artifactually increase the APTT. In one study,  the increase was an average of 19%.  Reference: "Effect on Routine and Special Coagulation Testing Values  of Citrate Anticoagulant Adjustment in Patients with High HCT Values."  American Journal of Clinical Pathology 2006;126:400-405.    13-Feb-15 08:03, Activated PTT  Activated PTT (APTT) > 160.0  A HCT value >55% may artifactually increase the APTT. In one study,  the increase was an average of 19%.  Reference: "Effect on Routine and Special Coagulation Testing Values  of Citrate Anticoagulant Adjustment in Patients with High HCT Values."  American  Journal of Clinical Pathology 2006;126:400-405.    13-Feb-15 10:35, Prothrombin Time  Prothrombin 14.6  INR 1.2  INR reference interval applies to patients on anticoagulant therapy.  A single INR therapeutic range for coumarins is not optimal for all  indications; however, the suggested range for most indications is  2.0 - 3.0.  Exceptions to the INR Reference Range may include: Prosthetic heart  valves, acute myocardial infarction, prevention of myocardial  infarction, and combinations of aspirin and anticoagulant. The need  for a higher or lower target INR must be assessed individually.  Reference: The Pharmacology and Management of the Vitamin K   antagonists: the seventh ACCP Conference on Antithrombotic and  Thrombolytic Therapy. FTDDU.2025 Sept:126 (3suppl): [id number].  A HCT value >55% may artifactually increase the PT.  In one study,   the increase was an average of 25%.  Reference:  "Effect on Routine and Special Coagulation Testing Values  of Citrate Anticoagulant Adjustment in Patients with High HCT Values."  American Journal of Clinical Pathology 2006;126:400-405.    13-Feb-15 18:32, Activated PTT  Activated PTT (APTT) 155.0  A HCT value >55% may artifactually increase the APTT. In one study,  the increase was an average of 19%.  Reference: "Effect on Routine and Special Coagulation Testing Values  of Citrate Anticoagulant Adjustment in Patients with High HCT Values."  American Journal of Clinical Pathology 2006;126:400-405.    14-Feb-15 04:15, Activated PTT  Activated PTT (APTT) 92.4  A HCT value >55% may artifactually increase the APTT. In one study,  the increase was an average of 19%.  Reference: "Effect on Routine and Special Coagulation Testing Values  of Citrate Anticoagulant Adjustment in Patients with High HCT Values."  American Journal of Clinical Pathology 2006;126:400-405.    14-Feb-15 04:15, Prothrombin Time  Prothrombin 14.2  INR 1.1  INR reference  interval applies to patients on anticoagulant therapy.  A single INR therapeutic range for coumarins is not optimal for all  indications; however, the suggested range for most indications is  2.0 - 3.0.  Exceptions to the INR Reference Range may include: Prosthetic heart  valves, acute myocardial infarction, prevention of myocardial  infarction, and combinations of aspirin and anticoagulant. The need  for a higher or lower target INR must be assessed individually.  Reference: The Pharmacology and Management of the Vitamin K   antagonists: the seventh ACCP Conference on Antithrombotic and  Thrombolytic Therapy. KYHCW.2376 Sept:126 (3suppl): [id number].  A HCT value >55% may artifactually increase the PT.  In one study,   the increase was an average of 25%.  Reference:  "Effect on Routine and Special Coagulation Testing Values  of Citrate Anticoagulant Adjustment in Patients with High HCT Values."  American Journal of Clinical Pathology 2006;126:400-405.    15-Feb-15 07:52, Activated PTT  Activated PTT (APTT) 140.0  A HCT value >  55% may artifactually increase the APTT. In one study,  the increase was an average of 19%.  Reference: "Effect on Routine and Special Coagulation Testing Values  of Citrate Anticoagulant Adjustment in Patients with High HCT Values."  American Journal of Clinical Pathology 2006;126:400-405.    15-Feb-15 07:52, Prothrombin Time  Prothrombin 14.3  INR 1.1  INR reference interval applies to patients on anticoagulant therapy.  A single INR therapeutic range for coumarins is not optimal for all  indications; however, the suggested range for most indications is  2.0 - 3.0.  Exceptions to the INR Reference Range may include: Prosthetic heart  valves, acute myocardial infarction, prevention of myocardial  infarction, and combinations of aspirin and anticoagulant. The need  for a higher or lower target INR must be assessed individually.  Reference: The Pharmacology and  Management of the Vitamin K   antagonists: the seventh ACCP Conference on Antithrombotic and  Thrombolytic Therapy. Chest.2004 Sept:126 (3suppl): [id number].  A HCT value >55% may artifactually increase the PT.  In one study,   the increase was an average of 25%.  Reference:  "Effect on Routine and Special Coagulation Testing Values  of Citrate Anticoagulant Adjustment in Patients with High HCT Values."  American Journal of Clinical Pathology 2006;126:400-405.    15-Feb-15 19:15, Activated PTT  Activated PTT (APTT) 68.9  A HCT value >55% may artifactually increase the APTT. In one study,  the increase was an average of 19%.  Reference: "Effect on Routine and Special Coagulation Testing Values  of Citrate Anticoagulant Adjustment in Patients with High HCT Values."  American Journal of Clinical Pathology 2006;126:400-405.    16-Feb-15 04:00, Activated PTT  Activated PTT (APTT) 127.2  A HCT value >55% may artifactually increase the APTT. In one study,  the increase was an average of 19%.  Reference: "Effect on Routine and Special Coagulation Testing Values  of Citrate Anticoagulant Adjustment in Patients with High HCT Values."  American Journal of Clinical Pathology 2006;126:400-405.    16-Feb-15 04:00, Prothrombin Time  Prothrombin 15.8  INR 1.3  INR reference interval applies to patients on anticoagulant therapy.  A single INR therapeutic range for coumarins is not optimal for all  indications; however, the suggested range for most indications is  2.0 - 3.0.  Exceptions to the INR Reference Range may include: Prosthetic heart  valves, acute myocardial infarction, prevention of myocardial  infarction, and combinations of aspirin and anticoagulant. The need  for a higher or lower target INR must be assessed individually.  Reference: The Pharmacology and Management of the Vitamin K   antagonists: the seventh ACCP Conference on Antithrombotic and  Thrombolytic Therapy. Chest.2004  Sept:126 (3suppl): [id number].  A HCT value >55% may artifactually increase the PT.  In one study,   the increase was an average of 25%.  Reference:  "Effect on Routine and Special Coagulation Testing Values  of Citrate Anticoagulant Adjustment in Patients with High HCT Values."  American Journal of Clinical Pathology 2006;126:400-405.  Routine Hem:    11-Feb-15 10:45, Hemogram, Platelet Count  WBC (CBC) 8.3  RBC (CBC) 4.08  Hemoglobin (CBC) 11.1  Hematocrit (CBC) 36.3  Platelet Count (CBC) 219  Result(s) reported on 17 Aug 2013 at 11:08AM.  MCV 89  MCH 27.3  MCHC 30.7  RDW 15.0    12-Feb-15 00:41, CBC Profile  WBC (CBC) 8.0  RBC (CBC) 3.85  Hemoglobin (CBC) 10.9  Hematocrit (CBC) 33.9  Platelet Count (CBC) 185  MCV 88  MCH 28.3  MCHC 32.1  RDW 15.5  Neutrophil % 69.4  Lymphocyte % 18.4  Monocyte % 9.9  Eosinophil % 1.5  Basophil % 0.8  Neutrophil # 5.6  Lymphocyte # 1.5  Monocyte # 0.8  Eosinophil # 0.1  Basophil # 0.1  Result(s) reported on 18 Aug 2013 at 01:38AM.    12-Feb-15 18:13, CBC Profile  WBC (CBC) 9.4  RBC (CBC) 3.74  Hemoglobin (CBC) 10.6  Hematocrit (CBC) 33.0  Platelet Count (CBC) 185  MCV 88  MCH 28.3  MCHC 32.1  RDW 15.5  Neutrophil % 82.8  Lymphocyte % 9.0  Monocyte % 6.8  Eosinophil % 0.8  Basophil % 0.6  Neutrophil # 7.8  Lymphocyte # 0.8  Monocyte # 0.6  Eosinophil # 0.1  Basophil # 0.1  Result(s) reported on 18 Aug 2013 at 06:41PM.    14-Feb-15 04:15, Hemoglobin  Hemoglobin (CBC) 10.0  Result(s) reported on 20 Aug 2013 at 04:44AM.    14-Feb-15 04:15, Platelet Count  Platelet Count (CBC) 190  Result(s) reported on 20 Aug 2013 at 04:44AM.    16-Feb-15 04:00, Hemoglobin  Hemoglobin (CBC) 10.1  Result(s) reported on 22 Aug 2013 at 07:52AM.    16-Feb-15 04:00, Platelet Count  Platelet Count (CBC) 190  Result(s) reported on 22 Aug 2013 at 07:52AM.   RADIOLOGY:  Radiology Results: XRay:    09-Oct-14 09:16, Knee Right Complete   Knee Right Complete  REASON FOR EXAM:    right knee pain with recent fall  COMMENTS:       PROCEDURE: DXR - DXR KNEE RT COMP WITH OBLIQUES  - Apr 14 2013  9:16AM     RESULT: Degenerative changes are present in the right knee including the   patellofemoral joint and medial and lateral compartments. There is   hypertrophic a bony protuberance from the tibial tuberosity into the   distal patellar tendon. Correlate with history and clinical findings.    IMPRESSION:  Moderate to severe DJD as described.    Dictation Site:2      Verified By: Elveria Royals, M.D., MD    11-Feb-15 11:13, Chest Portable Single View  Chest Portable Single View  REASON FOR EXAM:    Shortness of Breath  COMMENTS:       PROCEDURE: DXR - DXR PORTABLE CHEST SINGLE VIEW  - Aug 17 2013 11:13AM     CLINICAL DATA:  Shortness of Breath    EXAM:  PORTABLE CHEST - 1 VIEW    COMPARISON:  06/25/2010    FINDINGS:  Cardiomegaly is noted. Study is limited by patient's large body  habitus and poor inspiration. Central vascular congestion and mild  interstitial prominence suspicious for mild edema. Lung bases are  obscured by patient's large body habitus. Hazy bilateral basilar  atelectasis or infiltrate. Status post median sternotomy.     IMPRESSION:  Study is limited by patient's large body habitus and poor  inspiration. Central vascular congestion and mild interstitial  prominence suspicious for mild edema. Lung bases are obscured by  patient's large body habitus. Hazy bilateral basilar atelectasis or  infiltrate. Status post median sternotomy.      Electronically Signed    By: Natasha Mead M.D.    On: 08/17/2013 11:16     Verified By: Kennieth Francois, M.D.,  [place]:    13-Feb-15 11:23, [place] Color Flow Doppler Low Extrem Bilat (Legs)  [place] Color Flow Doppler Low Extrem Bilat (Legs)  REASON FOR EXAM:    History of DVT  COMMENTS:  PROCEDURE: [place]  - US DOPPLER LOW EXTR BILATERAL  - Aug 19 2013 11:23AM      CLINICAL DATA:  Bilateral lower extremity edema; history of previous  pulmonary embolus.    EXAM:  BILATERAL LOWER EXTREMITYVENOUS DUPLEX ULTRASOUND    TECHNIQUE:  Gray-scale sonography with graded compression, as well as color  Doppler and duplex ultrasound, were performed to evaluate the deep  venous system from the level of the common femoral vein through the  popliteal and proximal calf veins. Spectral Doppler was utilized to  evaluate flow at rest and with distal augmentation maneuvers.    COMPARISON:  None.    FINDINGS:  Flow in the venous structures of both lower extremities is  spontaneous and phasic in all segments. There is normal compression  and augmentation in the venous structures of both lower extremities.  Venous Doppler signal is normal in both lower extremities in all  segments. There is normal compression and augmentation in the venous  structures of both lower extremities. There is no evidence of deep  venous incompetence in either lower extremity.     IMPRESSION:  No evidence of deep venous thrombosis in either lower extremity.      Electronically Signed    By: Lowella Grip M.D.    On: 08/19/2013 11:24         Verified By: Leafy Kindle. Jasmine December, M.D.,  [place]:    09-Oct-14 09:16, Knee Right Complete  PACS Image    11-Dec-14 11:04, Screening Digital Mammogram  PACS Image    11-Feb-15 11:13, Chest Portable Single View  PACS Image    12-Feb-15 15:18, [facility] Chest with for PE  PACS Image    13-Feb-15 11:23, [place] Color Flow Doppler Low Extrem Bilat (Legs)  PACS Image  [facility]:    11-Dec-14 11:04, Screening Digital Mammogram  Screening Digital Mammogram  REASON FOR EXAM:    SCR MAMMO NO ORDER  COMMENTS:       PROCEDURE: MAM - MAM DGTL SCRN MAM NO ORDER W/CAD  - Jun 16 2013 11:04AM     CLINICAL DATA:  Screening.    EXAM:  DIGITAL SCREENING BILATERAL MAMMOGRAM WITH CAD    COMPARISON:  Previous exam(s).    ACR Breast  Density Category c: The breasts are heterogeneously  dense, which may obscure small masses.  FINDINGS:  Examination markedly limited due to issues with patient positioning  secondary to body habitus and patient being short of breath.    There are no findings suspicious for malignancy. Images were  processed with CAD.     IMPRESSION:  Limited exam.    No mammographic evidence of malignancy. A result letter of this  screening mammogram will be mailed directly to the patient.    RECOMMENDATION:  Screening mammogram in one year. (Code:SM-B-01Y)  BI-RADS CATEGORY  1: Negative      Electronically Signed    By: Lovey Newcomer M.D.    On: 06/16/2013 18:30         Verified By: Ilsa Iha, M.D.,   ASSESSMENT AND PLAN:  Assessment/Admission Diagnosis PE malignant HTN obesity poor venous access   Plan central line placed at bedside without difficulty    level 3   Electronic Signatures: Algernon Huxley (MD)  (Signed 16-Feb-15 09:42)  Authored: Chief Complaint and History, PAST MEDICAL/SURGICAL HISTORY, ALLERGIES, HOME MEDICATIONS, Family and Social History, Review of Systems, Physical Exam, LABS, RADIOLOGY, Assessment and Plan   Last Updated: 16-Feb-15 09:42 by Algernon Huxley (  MD) 

## 2014-10-28 NOTE — Consult Note (Signed)
PATIENT NAME:  Kimberly Peterson, Kimberly Peterson MR#:  [id number] DATE OF BIRTH:  1929/11/02  DATE OF CONSULTATION:  09/08/2013  CONSULTING PHYSICIAN:  Rodena Goldmann III, MD  PRIMARY CARE PHYSICIAN: Dr. Clayborn Bigness.   CHIEF COMPLAINT: Abdominal pain.   ADMITTING PHYSICIAN:  Prime Doc  BRIEF HISTORY: [name] is an 81 year old woman readmitted to the hospital after a recent admission for acute congestive heart failure. She was found to have evidence for chronic pulmonary embolism and was sent home on Coumadin and oxygen. She returns complaining of significant abdominal discomfort, primarily in midepigastric and periumbilical area. Her shortness of breath had increased over the time of her discharge. Workup in the Emergency Room revealed a lipase of almost 6000. Bilirubin was not elevated. Ultrasound demonstrated evidence of biliary tract disease and probable pancreatitis. There was no evidence for acute cholecystitis. She was admitted to the hospital, placed on oxygen, continued on her Coumadin and followed for further improvement in her pancreas. Her troponins were negative. She did not have any evidence of increased congestive heart failure over her previous examination on chest x-ray. She does have a history of chronic obstructive lung disease. Her lipase came down to around 2000 again with normal liver function studies. She does not have significant elevated white blood cell count but the surgical services consulted for possible surgical intervention. Medical problems well outlined in her admission note dated 09/07/2013.  PAST MEDICAL HISTORY: History of pacemaker and a previous aortic valve replacement.  ALLERGIES:  No known drug allergies.   MEDICATIONS: Outlined in her admission note.   SOCIAL HISTORY: She is not a cigarette smoker, although she did have a history of previous tobacco abuse.   PHYSICAL EXAMINATION: GENERAL: She is sitting in a chair at rest and is mildly short of breath. She denies  any pain currently. She is afebrile. Blood pressure 150/68. Heart rate 68 and regular.  HEENT: Reveals no scleral icterus. No pupillary abnormalities. She does have a fairly full facies. NECK: Supple with no adenopathy. Midline trachea.  CHEST: Clear, but she has very, very distant breath sounds with decreased pulmonary excursion.  CARDIAC: Reveals no murmurs or gallops to my ears. Seems to be normal sinus rhythm.  ABDOMEN: Distended, soft with minimal subxiphoid tenderness. There is no hernia noted in the midline.  ABDOMEN: Quite protuberant and she does not have any rebound or guarding.  EXTREMITIES: Reveals mild lower extremity edema. She does have full range of motion.  PSYCHIATRIC: Reveals normal orientation and normal affect.   IMPRESSION: This woman probably has biliary pancreatitis as the source of her current abdominal pain. She has long-standing history of congestive heart failure, chronic lung problems and deep vein thrombosis. She is currently on Coumadin with an elevated prothrombin time. She is really not a candidate for surgical intervention. This problem should be self-limited. Should there be a requirement for intervention, would recommend correcting her ProTime considering percutaneous intervention. I do not see any indication for surgery or any situation which we would consider cholecystectomy.   ____________________________ Rodena Goldmann III, MD rle:ce D: 09/08/2013 18:36:25 ET T: 09/08/2013 20:07:29 ET JOB#: [id number]  cc: Micheline Maze, MD, <Dictator> [name], MD Rodena Goldmann MD ELECTRONICALLY SIGNED 09/09/2013 12:30

## 2014-10-28 NOTE — Consult Note (Signed)
General Aspect 81 year old [place] American female with history of aortic valve replacement May 2010, nonsignificant CAD by cardiac catheterization prior to valve replacement, hypertension, hyperlipidemia, diabetes mellitus, chronic kidney disease with morbid obesity, presenting with 2 week history of shortness of breath and respiratory failure. The patient was initially treated with BiPAP and currently is on O2 at 4 L, although is still having some With shortness of breath while talking.  Recent CT of the chest did show an old PE but no acute PE.  Patient was scheduled for an outpatient sleep study which she canceled due to the shortness of breath.  Patient was on heparin drip and had hematuria with clots.  This was discontinued with resolution of hematuria.  Patient does have a pacemaker and has been in a sinus rhythm on the monitor.  She did present with elevated blood pressure which has improved.  Echocardiogram shows normal EF50-55% but technically difficult study.   Physical Exam:  GEN well developed, short of breath, With conversation   HEENT pink conjunctivae, hearing intact to voice   RESP postive use of accessory muscles  wheezing   CARD Regular rate and rhythm  Pacemaker   ABD denies tenderness  soft   EXTR Mild pedal edema   SKIN skin turgor decreased   NEURO cranial nerves intact, motor/sensory function intact   PSYCH poor insight, anxious   Review of Systems:  Subjective/Chief Complaint Shortness of breath   General: Weakness   Respiratory: Short of breath  Progressive over 2 weeks   Cardiovascular: Dyspnea   Genitourinary: Hematuria on heparin now resolved   Medications/Allergies Reviewed Medications/Allergies reviewed   Radiology Results: XRay:    11-Feb-15 11:13, Chest Portable Single View  Chest Portable Single View   REASON FOR EXAM:    Shortness of Breath  COMMENTS:       PROCEDURE: DXR - DXR PORTABLE CHEST SINGLE VIEW  - Aug 17 2013 11:13AM      CLINICAL DATA:  Shortness of Breath    EXAM:  PORTABLE CHEST - 1 VIEW    COMPARISON:  06/25/2010    FINDINGS:  Cardiomegaly is noted. Study is limited by patient's large body  habitus and poor inspiration. Central vascular congestion and mild  interstitial prominence suspicious for mild edema. Lung bases are  obscured by patient's large body habitus. Hazy bilateral basilar  atelectasis or infiltrate. Status post median sternotomy.     IMPRESSION:  Study is limited by patient's large body habitus and poor  inspiration. Central vascular congestion and mild interstitial  prominence suspicious for mild edema. Lung bases are obscured by  patient's large body habitus. Hazy bilateral basilar atelectasis or  infiltrate. Status post median sternotomy.      Electronically Signed    By: Lahoma Crocker M.D.    On: 08/17/2013 11:16     Verified By: Ephraim Hamburger, M.D.,    No Known Allergies:   Vital Signs/Nurse's Notes: **Vital Signs.:   12-Feb-15 11:00  Vital Signs Type Routine  Celsius 37  Temperature Source oral  Pulse Pulse 81  Respirations Respirations 20  Systolic BP Systolic BP 371  Diastolic BP (mmHg) Diastolic BP (mmHg) 76  Mean BP 104  Pulse Ox % Pulse Ox % 94  Pulse Ox Activity Level  At rest  Oxygen Delivery Non-invasive ventilation (CPAP/BIPAP)    Impression Acute respiratory failure, with echocardiogram showing   normal  left ventricular function, possibly some diastolic dysfunction contributing to clinical picture, however, no significant diastolic dysfunction  noted by echo, although it was a technically difficult study due to body   habitus,  with CT of the chest showing chronic PE, (? age), adenopathy, reactive or malignant, with an enlarged thyroid with no significant evidence of pulmonary edema mentioned on CT.   Plan 1.  Continue current therapy with diuresis, respiratory support,  but with emphasis on the lungs, to further evaluate respiratory distress.  2.   Further recommendations per Dr. [name].   Patient was seen in collaboration with Dr. [name].   Electronic Signatures: Roderic Palau (NP)  (Signed 12-Feb-15 17:12)  Authored: General Aspect/Present Illness, History and Physical Exam, Review of System, Radiology, Allergies, Vital Signs/Nurse's Notes, Impression/Plan   Last Updated: 12-Feb-15 17:12 by Roderic Palau (NP)

## 2014-10-28 NOTE — Consult Note (Signed)
PATIENT NAME:  Kimberly Peterson, CLAPP MR#:  [id number] DATE OF BIRTH:  19-Mar-1930  DATE OF CONSULTATION:  08/19/2013  REFERRING PHYSICIAN:   CONSULTING PHYSICIAN:  Allyne Gee, MD  HISTORY OF PRESENT ILLNESS:  This is an 81 year old female who has multiple medical issues. Past medical history is significant for sleep apnea. She comes into the hospital because she has been having difficulty breathing. She says that she is a very short of breath. She was supposed to have been seen in the office, canceled her appointments. She was brought in finally by the family as she had been refusing to come in prior. When she had a CT scan done, she was found to have what appears to be chronic pulmonary emboli.    PAST MEDICAL HISTORY:  1.  Congestive heart failure.  2.  Diabetes.  3.  Sleep apnea.  4.  Hyperlipidemia.  5.  Gastroesophageal reflux disease.   PAST SURGICAL HISTORY:  Pacemaker placement.   MEDICATIONS:  Reviewed on the electronic medical record.   SOCIAL HISTORY:  Negative for tobacco or alcohol use. No drug use.   FAMILY HISTORY:  Positive for diabetes.   REVIEW OF SYSTEMS:  It is limited but it is unremarkable in the 12-point review of systems that was attempted.   PHYSICAL EXAMINATION:  GENERAL:  At the time that she was seen, she was sitting up in bed, visibly short of breath.  VITAL SIGNS:  Temperature was 97.8, pulse 72, respiratory rate 18, blood pressure was 156/64, saturations were 96%.  NECK:  Appeared to be supple. There was no JVD and no adenopathy.  CHEST:  Coarse breath sounds. No rhonchi or rales. Expansion is equal.  ABDOMEN:  Obese and soft. I did not appreciate any hepatosplenomegaly.  NEUROLOGIC:  Awake, moving all 4 extremities, had a nonfocal examination.  SKIN:  Some chronic venous stasis changes.  MUSCULOSKELETAL:  Without any active synovitis.   LABORATORY, DIAGNOSTIC, AND RADIOLOGICAL DATA:  The patient's CT shows chronic thromboembolic disease. She has not had  a lower extremity Doppler done. She did have an ABG on admission. It was 7.33/79/67, that was on the 11th and she clinically appears to be improved. Her white blood cell count was 9.4, hemoglobin 10.6, hematocrit was 33. The BUN 17, creatinine 0.9, sodium 139, potassium 3.7.   IMPRESSION:  1.  Acute respiratory failure.  2.  Chronic pulmonary embolism.   PLAN:  I tried to speak with her to see if she has been to any other facility and she stated no. The last admission that was made here was in 2011. It would appear that she has had, at some point, the pulmonary embolic events. She has got some changes on the chest x-ray and CT scan noted. She should get a lower extremity Doppler. She is to be continued on heparin, and because there is a concern for noncompliance, I would want to consider an IVC filter if she has evidence of deep vein thrombosis present at this stage. Continue with supportive care, continue BiPAP as needed. Prognosis is poor.   ____________________________ Allyne Gee, MD sak:jm D: 08/19/2013 10:01:54 ET T: 08/19/2013 10:36:01 ET JOB#: [id number]  cc: Allyne Gee, MD, <Dictator> Allyne Gee MD ELECTRONICALLY SIGNED 09/29/2013 14:26

## 2014-11-01 DIAGNOSIS — Z13820 Encounter for screening for osteoporosis: Secondary | ICD-10-CM | POA: Diagnosis not present

## 2014-11-01 DIAGNOSIS — M8588 Other specified disorders of bone density and structure, other site: Secondary | ICD-10-CM | POA: Diagnosis not present

## 2014-11-16 DIAGNOSIS — I359 Nonrheumatic aortic valve disorder, unspecified: Secondary | ICD-10-CM | POA: Diagnosis not present

## 2015-01-18 DIAGNOSIS — N182 Chronic kidney disease, stage 2 (mild): Secondary | ICD-10-CM | POA: Diagnosis not present

## 2015-01-18 DIAGNOSIS — E782 Mixed hyperlipidemia: Secondary | ICD-10-CM | POA: Diagnosis not present

## 2015-01-18 DIAGNOSIS — I480 Paroxysmal atrial fibrillation: Secondary | ICD-10-CM | POA: Diagnosis not present

## 2015-01-18 DIAGNOSIS — I110 Hypertensive heart disease with heart failure: Secondary | ICD-10-CM | POA: Diagnosis not present

## 2015-01-18 DIAGNOSIS — E1165 Type 2 diabetes mellitus with hyperglycemia: Secondary | ICD-10-CM | POA: Diagnosis not present

## 2015-01-29 ENCOUNTER — Ambulatory Visit: Payer: Medicare Other

## 2015-02-13 DIAGNOSIS — I442 Atrioventricular block, complete: Secondary | ICD-10-CM | POA: Diagnosis not present

## 2015-03-07 NOTE — Patient Outreach (Signed)
[place] [facility]) Care Management  03/07/2015  Kimberly Peterson December 26, 1929 [id number]   Referral from [place] [facility], assigned Johny Shock, RN to outreach.  Thanks, Ronnell Freshwater. [place], [place] Assistant Phone: [phone] Fax: [phone]

## 2015-03-08 ENCOUNTER — Other Ambulatory Visit: Payer: Self-pay | Admitting: *Deleted

## 2015-03-08 ENCOUNTER — Encounter: Payer: Self-pay | Admitting: *Deleted

## 2015-03-08 NOTE — Patient Outreach (Addendum)
[place] [facility]) Care Management  03/08/2015  Kimberly Peterson 08-07-1929 [id number]   Telephone call to patient.  This patient is a high risk referral. The patient has verbalized that she is not interested in participating in the program.  [place] Management [phone]

## 2015-03-14 DIAGNOSIS — E782 Mixed hyperlipidemia: Secondary | ICD-10-CM | POA: Diagnosis not present

## 2015-03-14 DIAGNOSIS — E6609 Other obesity due to excess calories: Secondary | ICD-10-CM | POA: Diagnosis not present

## 2015-03-14 DIAGNOSIS — I10 Essential (primary) hypertension: Secondary | ICD-10-CM | POA: Diagnosis not present

## 2015-03-14 DIAGNOSIS — Z0001 Encounter for general adult medical examination with abnormal findings: Secondary | ICD-10-CM | POA: Diagnosis not present

## 2015-03-14 NOTE — Patient Outreach (Signed)
[place] [facility]) Care Management  03/14/2015  Kimberly Peterson April 27, 1930 [id number]   Notification from Johny Shock, RN to close case due to patient refused [place] Management services.  Thanks, Ronnell Freshwater. [place], [place] Assistant Phone: [phone] Fax: [phone]

## 2015-03-15 ENCOUNTER — Encounter: Payer: Self-pay | Admitting: Emergency Medicine

## 2015-03-15 ENCOUNTER — Emergency Department
Admission: EM | Admit: 2015-03-15 | Discharge: 2015-03-15 | Disposition: A | Discharge status: Home / Self Care | Payer: Medicare Other | Attending: Emergency Medicine | Admitting: Emergency Medicine

## 2015-03-15 DIAGNOSIS — Y9389 Activity, other specified: Secondary | ICD-10-CM | POA: Diagnosis not present

## 2015-03-15 DIAGNOSIS — T383X1A Poisoning by insulin and oral hypoglycemic [antidiabetic] drugs, accidental (unintentional), initial encounter: Secondary | ICD-10-CM | POA: Insufficient documentation

## 2015-03-15 DIAGNOSIS — Z7982 Long term (current) use of aspirin: Secondary | ICD-10-CM | POA: Insufficient documentation

## 2015-03-15 DIAGNOSIS — Z794 Long term (current) use of insulin: Secondary | ICD-10-CM | POA: Diagnosis not present

## 2015-03-15 DIAGNOSIS — R6889 Other general symptoms and signs: Secondary | ICD-10-CM | POA: Diagnosis not present

## 2015-03-15 DIAGNOSIS — Y998 Other external cause status: Secondary | ICD-10-CM | POA: Insufficient documentation

## 2015-03-15 DIAGNOSIS — I10 Essential (primary) hypertension: Secondary | ICD-10-CM | POA: Insufficient documentation

## 2015-03-15 DIAGNOSIS — Z7901 Long term (current) use of anticoagulants: Secondary | ICD-10-CM | POA: Insufficient documentation

## 2015-03-15 DIAGNOSIS — Z79899 Other long term (current) drug therapy: Secondary | ICD-10-CM | POA: Diagnosis not present

## 2015-03-15 DIAGNOSIS — Y9289 Other specified places as the place of occurrence of the external cause: Secondary | ICD-10-CM | POA: Insufficient documentation

## 2015-03-15 DIAGNOSIS — E119 Type 2 diabetes mellitus without complications: Secondary | ICD-10-CM | POA: Insufficient documentation

## 2015-03-15 LAB — CBC WITH DIFFERENTIAL/PLATELET
Basophils Absolute: 0 10*3/uL (ref 0–0.1)
Basophils Relative: 1 %
EOS ABS: 0.2 10*3/uL (ref 0–0.7)
Eosinophils Relative: 2 %
HCT: 40 % (ref 35.0–47.0)
Hemoglobin: 13.1 g/dL (ref 12.0–16.0)
Lymphocytes Relative: 21 %
Lymphs Abs: 1.8 10*3/uL (ref 1.0–3.6)
MCH: 28.2 pg (ref 26.0–34.0)
MCHC: 32.6 g/dL (ref 32.0–36.0)
MCV: 86.4 fL (ref 80.0–100.0)
MONOS PCT: 6 %
Monocytes Absolute: 0.5 10*3/uL (ref 0.2–0.9)
NEUTROS PCT: 70 %
Neutro Abs: 6.1 10*3/uL (ref 1.4–6.5)
PLATELETS: 214 10*3/uL (ref 150–440)
RBC: 4.63 MIL/uL (ref 3.80–5.20)
RDW: 13.9 % (ref 11.5–14.5)
WBC: 8.6 10*3/uL (ref 3.6–11.0)

## 2015-03-15 LAB — BASIC METABOLIC PANEL
Anion gap: 8 (ref 5–15)
BUN: 18 mg/dL (ref 6–20)
CALCIUM: 9.2 mg/dL (ref 8.9–10.3)
CO2: 29 mmol/L (ref 22–32)
Chloride: 102 mmol/L (ref 101–111)
Creatinine, Ser: 1.06 mg/dL — ABNORMAL HIGH (ref 0.44–1.00)
GFR calc non Af Amer: 47 mL/min — ABNORMAL LOW (ref >60)
GFR, EST AFRICAN AMERICAN: 55 mL/min — AB (ref >60)
Glucose, Bld: 159 mg/dL — ABNORMAL HIGH (ref 65–99)
Potassium: 5 mmol/L (ref 3.5–5.1)
Sodium: 139 mmol/L (ref 135–145)

## 2015-03-15 LAB — GLUCOSE, CAPILLARY: GLUCOSE-CAPILLARY: 172 mg/dL — AB (ref 65–99)

## 2015-03-15 NOTE — ED Provider Notes (Signed)
[facility] Emergency Department Provider Note  ____________________________________________  Time seen: 11:50 AM  I have reviewed the triage vital signs and the nursing notes.   HISTORY  Chief Complaint Drug Overdose    HPI Kimberly Peterson is a 81 y.o. female reports accidental insulin overdose. Around 9:00 she intended to take her 34 units of insulin 70/30by her son accidentally grabbed the wrong insulin instead she took 34 units of her fast acting insulin R.  This was around 9:00. She did eat after that and she feels totally fine.  No acute complaints.   Past Medical History  Diagnosis Date  . Hypertension   . Diabetes mellitus without complication   . PE (pulmonary embolism)   . CHF (congestive heart failure)      Patient Active Problem List   Diagnosis Date Noted  . Acute diastolic heart failure 58/03/9832  . Acute respiratory failure 09/23/2013  . Respiratory acidosis 09/23/2013  . Pulmonary embolism 09/23/2013  . Pulmonary edema 09/23/2013     Past Surgical History  Procedure Laterality Date  . Valve replacement    . Pacemaker placement       Current Outpatient Rx  Name  Route  Sig  Dispense  Refill  . acetaminophen (TYLENOL) 325 MG tablet   Oral   Take 325 mg by mouth every 6 (six) hours as needed (pain).         Marland Kitchen aspirin 81 MG tablet   Oral   Take 81 mg by mouth daily.         . carvedilol (COREG) 25 MG tablet   Oral   Take 25 mg by mouth 2 (two) times daily with a meal.         . furosemide (LASIX) 40 MG tablet   Oral   Take 0.5 tablets (20 mg total) by mouth every morning.   30 tablet   0   . glimepiride (AMARYL) 4 MG tablet   Oral   Take 2 mg by mouth daily after supper.          . hydrALAZINE (APRESOLINE) 25 MG tablet   Oral   Take 25 mg by mouth 3 (three) times daily.         . insulin aspart protamine- aspart (NOVOLOG MIX 70/30) (70-30) 100 UNIT/ML injection   Subcutaneous   Inject 28-32  Units into the skin 2 (two) times daily with a meal. 32 units in morning and 28 units in the evening         . traMADol (ULTRAM) 50 MG tablet   Oral   Take 1 tablet (50 mg total) by mouth every 6 (six) hours as needed.   20 tablet   0   . warfarin (COUMADIN) 5 MG tablet   Oral   Take 1 tablet (5 mg total) by mouth daily.   30 tablet   0      Allergies Review of patient's allergies indicates no known allergies.   No family history on file.  Social History Social History  Substance Use Topics  . Smoking status: Never Smoker   . Smokeless tobacco: Never Used  . Alcohol Use: No    Review of Systems  Constitutional:   No fever or chills. No weight changes Eyes:   No blurry vision or double vision.  ENT:   No sore throat. Cardiovascular:   No chest pain. Respiratory:   No dyspnea or cough. Gastrointestinal:   Negative for abdominal pain, vomiting and diarrhea.  No BRBPR or melena. Genitourinary:   Negative for dysuria, urinary retention, bloody urine, or difficulty urinating. Musculoskeletal:   Negative for back pain. No joint swelling or pain. Skin:   Negative for rash. Neurological:   Negative for headaches, focal weakness or numbness. Psychiatric:  No anxiety or depression.   Endocrine:  No hot/cold intolerance, changes in energy, or sleep difficulty.  10-point ROS otherwise negative.  ____________________________________________   PHYSICAL EXAM:  VITAL SIGNS: ED Triage Vitals  Enc Vitals Group     BP 03/15/15 1148 172/67 mmHg     Pulse Rate 03/15/15 1148 85     Resp --      Temp 03/15/15 1148 98.3 F (36.8 C)     Temp Source 03/15/15 1148 Oral     SpO2 03/15/15 1148 93 %     Weight 03/15/15 1148 220 lb (99.791 kg)     Height 03/15/15 1148 5\' 2"  (1.575 m)     Head Cir --      Peak Flow --      Pain Score 03/15/15 1151 0     Pain Loc --      Pain Edu? --      Excl. in [place]? --      Constitutional:   Alert and oriented. Well appearing and in no  distress. Eyes:   No scleral icterus. No conjunctival pallor. PERRL. EOMI ENT   Head:   Normocephalic and atraumatic.   Nose:   No congestion/rhinnorhea. No septal hematoma   Mouth/Throat:   MMM, no pharyngeal erythema. No peritonsillar mass. No uvula shift.   Neck:   No stridor. No SubQ emphysema. No meningismus. Hematological/Lymphatic/Immunilogical:   No cervical lymphadenopathy. Cardiovascular:   RRR. Normal and symmetric distal pulses are present in all extremities. No murmurs, rubs, or gallops. Respiratory:   Normal respiratory effort without tachypnea nor retractions. Breath sounds are clear and equal bilaterally. No wheezes/rales/rhonchi. Gastrointestinal:   Soft and nontender. No distention. There is no CVA tenderness.  No rebound, rigidity, or guarding. Genitourinary:   deferred Musculoskeletal:   Nontender with normal range of motion in all extremities. No joint effusions.  No lower extremity tenderness.  No edema. Neurologic:   Normal speech and language.  CN 2-10 normal. Motor grossly intact. No pronator drift.  Normal gait. No gross focal neurologic deficits are appreciated.  Skin:    Skin is warm, dry and intact. No rash noted.  No petechiae, purpura, or bullae. Psychiatric:   Mood and affect are normal. Speech and behavior are normal. Patient exhibits appropriate insight and judgment.  ____________________________________________    LABS (pertinent positives/negatives) (all labs ordered are listed, but only abnormal results are displayed) Labs Reviewed  BASIC METABOLIC PANEL - Abnormal; Notable for the following:    Glucose, Bld 159 (*)    Creatinine, Ser 1.06 (*)    GFR calc non Af Amer 47 (*)    GFR calc Af Amer 55 (*)    All other components within normal limits  CBC WITH DIFFERENTIAL/PLATELET  CBG MONITORING, ED   ____________________________________________   EKG    ____________________________________________     RADIOLOGY    ____________________________________________   PROCEDURES   ____________________________________________   INITIAL IMPRESSION / ASSESSMENT AND PLAN / ED COURSE  Pertinent labs & imaging results that were available during my care of the patient were reviewed by me and considered in my medical decision making (see chart for details).  Patient presents for evaluation after accidental overdose of fast acting insulin.  However she is on high doses of insulin and so this one time additional dose is not likely to produce any lasting profound effects. Additionally 3 hours after the exposure, her labs including serum glucose are unremarkable. She is stable for discharge home and can follow up with primary care as needed.     ____________________________________________   FINAL CLINICAL IMPRESSION(S) / ED DIAGNOSES  Final diagnoses:  Insulin overdose, accidental or unintentional, initial encounter      Carrie Mew, MD 03/15/15 1341

## 2015-03-15 NOTE — Discharge Instructions (Signed)
Accidental Overdose °A drug overdose occurs when a chemical substance (drug or medication) is used in amounts large enough to overcome a person. This may result in severe illness or death. This is a type of poisoning. Accidental overdoses of medications or other substances come from a variety of reasons. When this happens accidentally, it is often because the person taking the substance does not know enough about what they have taken. Drugs which commonly cause overdose deaths are alcohol, psychotropic medications (medications which affect the mind), pain medications, illegal drugs (street drugs) such as cocaine and heroin, and multiple drugs taken at the same time. It may result from careless behavior (such as over-indulging at a party). Other causes of overdose may include multiple drug use, a lapse in memory, or drug use after a period of no drug use.  °Sometimes overdosing occurs because a person cannot remember if they have taken their medication.  °A common unintentional overdose in young children involves multi-vitamins containing iron. Iron is a part of the hemoglobin molecule in blood. It is used to transport oxygen to living cells. When taken in small amounts, iron allows the body to restock hemoglobin. In large amounts, it causes problems in the body. If this overdose is not treated, it can lead to death. °Never take medicines that show signs of tampering or do not seem quite right. Never take medicines in the dark or in poor lighting. Read the label and check each dose of medicine before you take it. When adults are poisoned, it happens most often through carelessness or lack of information. Taking medicines in the dark or taking medicine prescribed for someone else to treat the same type of problem is a dangerous practice. °SYMPTOMS  °Symptoms of overdose depend on the medication and amount taken. They can vary from over-activity with stimulant over-dosage, to sleepiness from depressants such as  alcohol, narcotics and tranquilizers. Confusion, dizziness, nausea and vomiting may be present. If problems are severe enough coma and death may result. °DIAGNOSIS  °Diagnosis and management are generally straightforward if the drug is known. Otherwise it is more difficult. At times, certain symptoms and signs exhibited by the patient, or blood tests, can reveal the drug in question.  °TREATMENT  °In an emergency department, most patients can be treated with supportive measures. Antidotes may be available if there has been an overdose of opioids or benzodiazepines. A rapid improvement will often occur if this is the cause of overdose. °At home or away from medical care: °· There may be no immediate problems or warning signs in children. °· Not everything works well in all cases of poisoning. °· Take immediate action. Poisons may act quickly. °· If you think someone has swallowed medicine or a household product, and the person is unconscious, having seizures (convulsions), or is not breathing, immediately call for an ambulance. °IF a person is conscious and appears to be doing OK but has swallowed a poison: °· Do not wait to see what effect the poison will have. Immediately call a poison control center (listed in the white pages of your telephone book under "Poison Control" or inside the front cover with other emergency numbers). Some poison control centers have TTY capability for the deaf. Check with your local center if you or someone in your family requires this service. °· Keep the container so you can read the label on the product for ingredients. °· Describe what, when, and how much was taken and the age and condition of the person poisoned.   Describe what, when, and how much was taken and the age and condition of the person poisoned. Inform them if the person is vomiting, choking, drowsy, shows a change in color or temperature of skin, is conscious or unconscious, or is convulsing.   Do not cause vomiting unless instructed by medical personnel. Do not induce vomiting or force liquids into a person who  is convulsing, unconscious, or very drowsy.  Stay calm and in control.    Activated charcoal also is sometimes used in certain types of poisoning and you may wish to add a supply to your emergency medicines. It is available without a prescription. Call a poison control center before using this medication.  PREVENTION   Thousands of children die every year from unintentional poisoning. This may be from household chemicals, poisoning from carbon monoxide in a car, taking their parent's medications, or simply taking a few iron pills or vitamins with iron. Poisoning comes from unexpected sources.   Store medicines out of the sight and reach of children, preferably in a locked cabinet. Do not keep medications in a food cabinet. Always store your medicines in a secure place. Get rid of expired medications.   If you have children living with you or have them as occasional guests, you should have child-resistant caps on your medicine containers. Keep everything out of reach. Child proof your home.   If you are called to the telephone or to answer the door while you are taking a medicine, take the container with you or put the medicine out of the reach of small children.   Do not take your medication in front of children. Do not tell your child how good a medication is and how good it is for them. They may get the idea it is more of a treat.   If you are an adult and have accidentally taken an overdose, you need to consider how this happened and what can be done to prevent it from happening again. If this was from a street drug or alcohol, determine if there is a problem that needs addressing. If you are not sure a problems exists, it is easy to talk to a professional and ask them if they think you have a problem. It is better to handle this problem in this way before it happens again and has a much worse consequence.  Document Released: 09/06/2004 Document Revised: 09/15/2011 Document Reviewed: 02/12/2009  [employer]  Patient Information 2015 [employer]. This information is not intended to replace advice given to you by your health care provider. Make sure you discuss any questions you have with your health care provider.

## 2015-03-15 NOTE — ED Notes (Signed)
Ems from home. Pt took humulin R 34 units instead of Humulin 70/30 by accident. Pt ate after insulin. fsbs > than 100 for ems

## 2015-03-15 NOTE — ED Notes (Signed)
Pt became sob with sats in 80s while walking to toilet , put on 2L o2 nasal. Pt is on 2L continuous at home

## 2015-03-15 NOTE — ED Notes (Signed)
Pt a&o x4. Pt doesn't complain of any symptoms at this time. "I just took too much insulin but then ate" per pt.

## 2015-03-22 DIAGNOSIS — I480 Paroxysmal atrial fibrillation: Secondary | ICD-10-CM | POA: Diagnosis not present

## 2015-03-22 DIAGNOSIS — E782 Mixed hyperlipidemia: Secondary | ICD-10-CM | POA: Diagnosis not present

## 2015-03-22 DIAGNOSIS — E1165 Type 2 diabetes mellitus with hyperglycemia: Secondary | ICD-10-CM | POA: Diagnosis not present

## 2015-03-22 DIAGNOSIS — I420 Dilated cardiomyopathy: Secondary | ICD-10-CM | POA: Diagnosis not present

## 2015-03-22 DIAGNOSIS — I110 Hypertensive heart disease with heart failure: Secondary | ICD-10-CM | POA: Diagnosis not present

## 2015-03-22 DIAGNOSIS — E6609 Other obesity due to excess calories: Secondary | ICD-10-CM | POA: Diagnosis not present

## 2015-03-22 DIAGNOSIS — Z7901 Long term (current) use of anticoagulants: Secondary | ICD-10-CM | POA: Diagnosis not present

## 2015-04-09 DIAGNOSIS — H16233 Neurotrophic keratoconjunctivitis, bilateral: Secondary | ICD-10-CM | POA: Diagnosis not present

## 2015-04-11 ENCOUNTER — Encounter: Payer: Self-pay | Admitting: Podiatry

## 2015-04-11 ENCOUNTER — Ambulatory Visit (INDEPENDENT_AMBULATORY_CARE_PROVIDER_SITE_OTHER): Payer: Medicare Other | Admitting: Podiatry

## 2015-04-11 DIAGNOSIS — B351 Tinea unguium: Secondary | ICD-10-CM | POA: Diagnosis not present

## 2015-04-11 DIAGNOSIS — M79676 Pain in unspecified toe(s): Secondary | ICD-10-CM | POA: Diagnosis not present

## 2015-04-11 DIAGNOSIS — E1142 Type 2 diabetes mellitus with diabetic polyneuropathy: Secondary | ICD-10-CM

## 2015-04-11 LAB — HM DIABETES FOOT EXAM

## 2015-04-11 NOTE — Progress Notes (Signed)
Kimberly Peterson presents today with chief complaint of painful elongated toenails. She states that her diabetes is doing well.  Objective: Vital signs are stable she is alert and oriented 3. Pulses are strongly palpable. Her nails are thick yellow dystrophic onychomycotic and painful palpation.  Assessment: Pain in limb secondary to diabetic peripheral neuropathy and thick mycotic painful nails.  Plan: Debridement of nails and thickness length is covered service secondary to pain.

## 2015-06-25 DIAGNOSIS — N182 Chronic kidney disease, stage 2 (mild): Secondary | ICD-10-CM | POA: Diagnosis not present

## 2015-06-25 DIAGNOSIS — Z7901 Long term (current) use of anticoagulants: Secondary | ICD-10-CM | POA: Diagnosis not present

## 2015-06-25 DIAGNOSIS — E1165 Type 2 diabetes mellitus with hyperglycemia: Secondary | ICD-10-CM | POA: Diagnosis not present

## 2015-06-25 DIAGNOSIS — E782 Mixed hyperlipidemia: Secondary | ICD-10-CM | POA: Diagnosis not present

## 2015-06-25 DIAGNOSIS — I480 Paroxysmal atrial fibrillation: Secondary | ICD-10-CM | POA: Diagnosis not present

## 2015-07-16 ENCOUNTER — Ambulatory Visit: Payer: Medicare Other | Admitting: Podiatry

## 2015-07-30 ENCOUNTER — Encounter: Payer: Self-pay | Admitting: Podiatry

## 2015-07-30 ENCOUNTER — Ambulatory Visit (INDEPENDENT_AMBULATORY_CARE_PROVIDER_SITE_OTHER): Payer: Medicare Other | Admitting: Podiatry

## 2015-07-30 DIAGNOSIS — Q828 Other specified congenital malformations of skin: Secondary | ICD-10-CM

## 2015-07-30 DIAGNOSIS — M79676 Pain in unspecified toe(s): Secondary | ICD-10-CM

## 2015-07-30 DIAGNOSIS — B351 Tinea unguium: Secondary | ICD-10-CM | POA: Diagnosis not present

## 2015-07-30 DIAGNOSIS — E1142 Type 2 diabetes mellitus with diabetic polyneuropathy: Secondary | ICD-10-CM | POA: Diagnosis not present

## 2015-07-30 NOTE — Progress Notes (Signed)
She presents today with her daughter with a chief complaint of painfully elongated toenails and calluses.  Objective: Vital signs are stable she is alert and oriented 3. Pulses are palpable. Toenails are thick yellow dystrophic clinic with mycotic painful on palpation as well as debridement. Multiple porokeratotic lesions and distal clavi bilateral foot. No open lesions or wounds.  Assessment: Pain and limb secondary to onychomycosis 1 through 5 bilateral.  Plan: Debridement of toenails 1 through 5 bilateral cover service secondary to pain. Debridement of all porokeratotic lesions.

## 2015-09-19 ENCOUNTER — Inpatient Hospital Stay (HOSPITAL_COMMUNITY)
Admission: EM | Admit: 2015-09-19 | Discharge: 2015-09-21 | DRG: 291 | Disposition: A | Discharge status: Home / Self Care | Payer: Medicare Other | Attending: Internal Medicine | Admitting: Internal Medicine

## 2015-09-19 ENCOUNTER — Other Ambulatory Visit (HOSPITAL_COMMUNITY): Payer: Medicare Other

## 2015-09-19 ENCOUNTER — Encounter (HOSPITAL_COMMUNITY): Payer: Self-pay

## 2015-09-19 ENCOUNTER — Emergency Department (HOSPITAL_COMMUNITY): Payer: Medicare Other

## 2015-09-19 ENCOUNTER — Inpatient Hospital Stay (HOSPITAL_COMMUNITY): Payer: Medicare Other

## 2015-09-19 DIAGNOSIS — E1110 Type 2 diabetes mellitus with ketoacidosis without coma: Secondary | ICD-10-CM

## 2015-09-19 DIAGNOSIS — I5033 Acute on chronic diastolic (congestive) heart failure: Secondary | ICD-10-CM | POA: Diagnosis not present

## 2015-09-19 DIAGNOSIS — Z952 Presence of prosthetic heart valve: Secondary | ICD-10-CM | POA: Diagnosis not present

## 2015-09-19 DIAGNOSIS — R140 Abdominal distension (gaseous): Secondary | ICD-10-CM | POA: Diagnosis not present

## 2015-09-19 DIAGNOSIS — I10 Essential (primary) hypertension: Secondary | ICD-10-CM | POA: Diagnosis not present

## 2015-09-19 DIAGNOSIS — Z79899 Other long term (current) drug therapy: Secondary | ICD-10-CM

## 2015-09-19 DIAGNOSIS — R0602 Shortness of breath: Secondary | ICD-10-CM | POA: Diagnosis not present

## 2015-09-19 DIAGNOSIS — E139 Other specified diabetes mellitus without complications: Secondary | ICD-10-CM | POA: Diagnosis not present

## 2015-09-19 DIAGNOSIS — Z950 Presence of cardiac pacemaker: Secondary | ICD-10-CM

## 2015-09-19 DIAGNOSIS — I2699 Other pulmonary embolism without acute cor pulmonale: Secondary | ICD-10-CM | POA: Diagnosis present

## 2015-09-19 DIAGNOSIS — E119 Type 2 diabetes mellitus without complications: Secondary | ICD-10-CM | POA: Diagnosis present

## 2015-09-19 DIAGNOSIS — J441 Chronic obstructive pulmonary disease with (acute) exacerbation: Secondary | ICD-10-CM | POA: Diagnosis present

## 2015-09-19 DIAGNOSIS — I110 Hypertensive heart disease with heart failure: Principal | ICD-10-CM | POA: Diagnosis present

## 2015-09-19 DIAGNOSIS — R030 Elevated blood-pressure reading, without diagnosis of hypertension: Secondary | ICD-10-CM | POA: Diagnosis not present

## 2015-09-19 DIAGNOSIS — Z8249 Family history of ischemic heart disease and other diseases of the circulatory system: Secondary | ICD-10-CM | POA: Diagnosis not present

## 2015-09-19 DIAGNOSIS — Z7982 Long term (current) use of aspirin: Secondary | ICD-10-CM | POA: Diagnosis not present

## 2015-09-19 DIAGNOSIS — I509 Heart failure, unspecified: Secondary | ICD-10-CM | POA: Diagnosis not present

## 2015-09-19 DIAGNOSIS — J9621 Acute and chronic respiratory failure with hypoxia: Secondary | ICD-10-CM | POA: Diagnosis present

## 2015-09-19 DIAGNOSIS — Z7901 Long term (current) use of anticoagulants: Secondary | ICD-10-CM

## 2015-09-19 DIAGNOSIS — R109 Unspecified abdominal pain: Secondary | ICD-10-CM

## 2015-09-19 DIAGNOSIS — J449 Chronic obstructive pulmonary disease, unspecified: Secondary | ICD-10-CM | POA: Diagnosis present

## 2015-09-19 DIAGNOSIS — Z794 Long term (current) use of insulin: Secondary | ICD-10-CM | POA: Diagnosis not present

## 2015-09-19 LAB — COMPREHENSIVE METABOLIC PANEL
ALBUMIN: 3.8 g/dL (ref 3.5–5.0)
ALK PHOS: 78 U/L (ref 38–126)
ALT: 17 U/L (ref 14–54)
ANION GAP: 10 (ref 5–15)
AST: 24 U/L (ref 15–41)
BILIRUBIN TOTAL: 0.5 mg/dL (ref 0.3–1.2)
BUN: 16 mg/dL (ref 6–20)
CALCIUM: 9.2 mg/dL (ref 8.9–10.3)
CO2: 28 mmol/L (ref 22–32)
CREATININE: 0.95 mg/dL (ref 0.44–1.00)
Chloride: 103 mmol/L (ref 101–111)
GFR calc Af Amer: >60 mL/min (ref >60)
GFR calc non Af Amer: 54 mL/min — ABNORMAL LOW (ref >60)
GLUCOSE: 121 mg/dL — AB (ref 65–99)
Potassium: 3.9 mmol/L (ref 3.5–5.1)
Sodium: 141 mmol/L (ref 135–145)
TOTAL PROTEIN: 8 g/dL (ref 6.5–8.1)

## 2015-09-19 LAB — CBC WITH DIFFERENTIAL/PLATELET
BASOS PCT: 0 %
Basophils Absolute: 0 10*3/uL (ref 0.0–0.1)
Eosinophils Absolute: 0.1 10*3/uL (ref 0.0–0.7)
Eosinophils Relative: 1 %
HEMATOCRIT: 40.4 % (ref 36.0–46.0)
HEMOGLOBIN: 12.1 g/dL (ref 12.0–15.0)
LYMPHS ABS: 1.3 10*3/uL (ref 0.7–4.0)
LYMPHS PCT: 15 %
MCH: 27.3 pg (ref 26.0–34.0)
MCHC: 30 g/dL (ref 30.0–36.0)
MCV: 91.2 fL (ref 78.0–100.0)
MONOS PCT: 7 %
Monocytes Absolute: 0.6 10*3/uL (ref 0.1–1.0)
NEUTROS ABS: 6.5 10*3/uL (ref 1.7–7.7)
NEUTROS PCT: 77 %
Platelets: 230 10*3/uL (ref 150–400)
RBC: 4.43 MIL/uL (ref 3.87–5.11)
RDW: 14.2 % (ref 11.5–15.5)
WBC: 8.4 10*3/uL (ref 4.0–10.5)

## 2015-09-19 LAB — I-STAT TROPONIN, ED: Troponin i, poc: 0.05 ng/mL (ref 0.00–0.08)

## 2015-09-19 LAB — PROTIME-INR
INR: 1.8 — ABNORMAL HIGH (ref 0.00–1.49)
Prothrombin Time: 20.9 seconds — ABNORMAL HIGH (ref 11.6–15.2)

## 2015-09-19 LAB — BRAIN NATRIURETIC PEPTIDE: B Natriuretic Peptide: 194.9 pg/mL — ABNORMAL HIGH (ref 0.0–100.0)

## 2015-09-19 LAB — GLUCOSE, CAPILLARY
Glucose-Capillary: 177 mg/dL — ABNORMAL HIGH (ref 65–99)
Glucose-Capillary: 175 mg/dL — ABNORMAL HIGH (ref 65–99)

## 2015-09-19 MED ORDER — MAGNESIUM HYDROXIDE 400 MG/5ML PO SUSP: 5.0000 mL | Freq: Every day | ORAL | Status: DC | PRN

## 2015-09-19 MED ORDER — HYDRALAZINE HCL 25 MG PO TABS
25.0000 mg | ORAL_TABLET | Freq: Three times a day (TID) | ORAL | Status: DC
  Administered 2015-09-19 – 2015-09-21 (×7): 25 mg via ORAL
  Filled 2015-09-19 (×7): qty 1

## 2015-09-19 MED ORDER — ALUM & MAG HYDROXIDE-SIMETH 200-200-20 MG/5ML PO SUSP: 30.0000 mL | ORAL | Status: DC | PRN

## 2015-09-19 MED ORDER — WARFARIN - PHYSICIAN DOSING INPATIENT: Freq: Every day | Status: DC

## 2015-09-19 MED ORDER — SODIUM CHLORIDE 0.9% FLUSH
3.0000 mL | Freq: Two times a day (BID) | INTRAVENOUS | Status: DC
  Administered 2015-09-19 – 2015-09-20 (×3): 3 mL via INTRAVENOUS

## 2015-09-19 MED ORDER — SODIUM CHLORIDE 0.9% FLUSH: 3.0000 mL | INTRAVENOUS | Status: DC | PRN

## 2015-09-19 MED ORDER — WARFARIN SODIUM 5 MG PO TABS
5.0000 mg | ORAL_TABLET | Freq: Every day | ORAL | Status: DC
  Administered 2015-09-19: 5 mg via ORAL
  Filled 2015-09-19: qty 1

## 2015-09-19 MED ORDER — FUROSEMIDE 10 MG/ML IJ SOLN
40.0000 mg | Freq: Once | INTRAMUSCULAR | Status: AC
  Administered 2015-09-19: 40 mg via INTRAVENOUS
  Filled 2015-09-19: qty 4

## 2015-09-19 MED ORDER — ALUM & MAG HYDROXIDE-SIMETH 200-200-25 MG PO CHEW: 1.0000 | CHEWABLE_TABLET | Freq: Four times a day (QID) | ORAL | Status: DC | PRN

## 2015-09-19 MED ORDER — SODIUM CHLORIDE 0.9 % IV SOLN: 250.0000 mL | INTRAVENOUS | Status: DC | PRN

## 2015-09-19 MED ORDER — ASPIRIN EC 81 MG PO TBEC
81.0000 mg | DELAYED_RELEASE_TABLET | Freq: Every day | ORAL | Status: DC
  Administered 2015-09-20 – 2015-09-21 (×2): 81 mg via ORAL
  Filled 2015-09-19 (×5): qty 1

## 2015-09-19 MED ORDER — INSULIN ASPART 100 UNIT/ML ~~LOC~~ SOLN
0.0000 [IU] | Freq: Three times a day (TID) | SUBCUTANEOUS | Status: DC
  Administered 2015-09-19: 2 [IU] via SUBCUTANEOUS
  Administered 2015-09-20 (×2): 3 [IU] via SUBCUTANEOUS
  Administered 2015-09-20: 2 [IU] via SUBCUTANEOUS
  Administered 2015-09-21: 3 [IU] via SUBCUTANEOUS
  Administered 2015-09-21: 4 [IU] via SUBCUTANEOUS
  Administered 2015-09-21: 3 [IU] via SUBCUTANEOUS

## 2015-09-19 MED ORDER — FUROSEMIDE 10 MG/ML IJ SOLN
40.0000 mg | Freq: Every day | INTRAMUSCULAR | Status: DC
  Administered 2015-09-20: 40 mg via INTRAVENOUS
  Filled 2015-09-19: qty 4

## 2015-09-19 MED ORDER — INSULIN ASPART PROT & ASPART (70-30 MIX) 100 UNIT/ML ~~LOC~~ SUSP
12.0000 [IU] | Freq: Two times a day (BID) | SUBCUTANEOUS | Status: DC
Start: 2015-09-19 — End: 2015-09-21
  Administered 2015-09-19 – 2015-09-21 (×5): 12 [IU] via SUBCUTANEOUS
  Filled 2015-09-19: qty 10

## 2015-09-19 MED ORDER — ACETAMINOPHEN 325 MG PO TABS: 650.0000 mg | ORAL_TABLET | ORAL | Status: DC | PRN

## 2015-09-19 MED ORDER — ONDANSETRON HCL 4 MG/2ML IJ SOLN: 4.0000 mg | Freq: Four times a day (QID) | INTRAMUSCULAR | Status: DC | PRN

## 2015-09-19 MED ORDER — CARVEDILOL 6.25 MG PO TABS
6.2500 mg | ORAL_TABLET | Freq: Two times a day (BID) | ORAL | Status: DC
  Administered 2015-09-19 – 2015-09-21 (×5): 6.25 mg via ORAL
  Filled 2015-09-19 (×7): qty 1

## 2015-09-19 MED ORDER — INSULIN ASPART 100 UNIT/ML ~~LOC~~ SOLN
0.0000 [IU] | Freq: Every day | SUBCUTANEOUS | Status: DC
  Administered 2015-09-20: 3 [IU] via SUBCUTANEOUS

## 2015-09-19 NOTE — ED Provider Notes (Signed)
CSN: [id number]     Arrival date & time 09/19/15  0848 History   First MD Initiated Contact with Patient 09/19/15 [phone]     Chief Complaint  Patient presents with  . Bloated  . Shortness of Breath     (Consider location/radiation/quality/duration/timing/severity/associated sxs/prior Treatment) HPI 82 year old female who presents with shortness of breath and abdominal bloating. History of HTN, chronic diastolic heart failure, COPD, on 2 L home oxygen, DM, and PE on coumadin. Sates 2 days of progressive shortness of breath with activity. Sates consistent with CHF flare up. Complaint with low salt diet and diuretics. Abdominal distension but no edema. Sleeps with CPAP at night, which helps with breathing, No PND. Denies chest pain, cough, congestions, sore throat, runny nose, fever, chills, N/V, diarrhea or constipation.   Past Medical History  Diagnosis Date  . Hypertension   . Diabetes mellitus without complication ([place])   . PE (pulmonary embolism)   . CHF (congestive heart failure) [facility])    Past Surgical History  Procedure Laterality Date  . Valve replacement    . Pacemaker placement     History reviewed. No pertinent family history. Social History  Substance Use Topics  . Smoking status: Never Smoker   . Smokeless tobacco: Never Used  . Alcohol Use: No   OB History    No data available     Review of Systems 10/14 systems reviewed and are negative other than those stated in the HPI    Allergies  Review of patient's allergies indicates no known allergies.  Home Medications   Prior to Admission medications   Medication Sig Start Date End Date Taking? Authorizing Provider  aspirin 81 MG tablet Take 81 mg by mouth daily.   Yes Historical Provider, MD  carvedilol (COREG) 25 MG tablet Take 25 mg by mouth 2 (two) times daily.    Yes Historical Provider, MD  furosemide (LASIX) 40 MG tablet Take 0.5 tablets (20 mg total) by mouth every morning. Patient taking differently:  Take 40 mg by mouth every morning.  09/29/13  Yes Maryann Mikhail, DO  hydrALAZINE (APRESOLINE) 25 MG tablet Take 25 mg by mouth 3 (three) times daily.   Yes Historical Provider, MD  insulin aspart protamine- aspart (NOVOLOG MIX 70/30) (70-30) 100 UNIT/ML injection Inject 26-34 Units into the skin 2 (two) times daily with a meal. 34 units in morning and 26 units in the evening   Yes Historical Provider, MD  warfarin (COUMADIN) 5 MG tablet Take 1 tablet (5 mg total) by mouth daily. 09/29/13  Yes Maryann Mikhail, DO   BP 186/74 mmHg  Pulse 70  Temp(Src) 98.4 F (36.9 C) (Oral)  Resp 23  SpO2 96% Physical Exam Physical Exam  Nursing note and vitals reviewed. Constitutional: Elderly, non-toxic, and in no acute distress Head: Normocephalic and atraumatic.  Mouth/Throat: Oropharynx is clear and moist.  Neck: Normal range of motion. Neck supple.  Cardiovascular: Normal rate and regular rhythm.  No significant LE edema. Pulmonary/Chest: Speaks in full sentences, tachypnea, diminished breath sound throughout Abdominal: Soft. Mild distension. There is no tenderness. There is no rebound and no guarding.  Musculoskeletal: Normal range of motion.  Neurological: Alert, no facial droop, fluent speech, moves all extremities symmetrically Skin: Skin is warm and dry.  Psychiatric: Cooperative   ED Course  Procedures (including critical care time) Labs Review Labs Reviewed  BRAIN NATRIURETIC PEPTIDE - Abnormal; Notable for the following:    B Natriuretic Peptide 194.9 (*)    All  other components within normal limits  COMPREHENSIVE METABOLIC PANEL - Abnormal; Notable for the following:    Glucose, Bld 121 (*)    GFR calc non Af Amer 54 (*)    All other components within normal limits  PROTIME-INR - Abnormal; Notable for the following:    Prothrombin Time 20.9 (*)    INR 1.80 (*)    All other components within normal limits  CBC WITH DIFFERENTIAL/PLATELET  Randolm Idol, ED    Imaging  Review Dg Chest 2 View  09/19/2015  CLINICAL DATA:  Shortness of breath, progressed over past 2 days EXAM: CHEST  2 VIEW COMPARISON:  Nov 21, 2013 FINDINGS: There is no edema or consolidation. There is stable cardiomegaly with pulmonary venous hypertension. Pacemaker leads are attached to the right atrium and right ventricle. No adenopathy. No pneumothorax. There is degenerative change in the thoracic spine and in the shoulder regions. IMPRESSION: Pulmonary vascular congestion. No frank edema or consolidation. Stable appearing pacemaker leads. Electronically Signed   By: Lowella Grip III M.D.   On: 09/19/2015 09:45   I have personally reviewed and evaluated these images and lab results as part of my medical decision-making.   EKG Interpretation   Date/Time:  Wednesday September 19 2015 09:15:45 EDT Ventricular Rate:  87 PR Interval:  60 QRS Duration: 151 QT Interval:  435 QTC Calculation: 523 R Axis:   57 Text Interpretation:  Atrial-ventricular dual-paced rhythm No further  analysis attempted due to paced rhythm Confirmed by Amberly Livas MD, Naz Denunzio [id number])  on 09/19/2015 10:34:37 AM      MDM   Final diagnoses:  Acute on chronic diastolic congestive heart failure ([place])    82 year old female who presents with dyspnea on exertion. On presentation, she is nontoxic and in no acute distress. Is on home supplemental oxygen and appears comfortable at rest with normal work of breathing. No wheezes on lung exam suggestive of COPD exacerbation, although breath sounds are diminished throughout. With minimal activity, she is noted to have increased work of breathing and conversational dyspnea. Her BNP is mildly elevated and her chest x-ray shows cardiomegaly with pulmonary vascular congestion. It seems that her presentation is consistent with that of acute on chronic diastolic heart failure exacerbation. Is given 40 of Lasix for diuresis. Of note, her INR is mildly subtherapeutic 1.88. Lower suspicion for PE  but if not responsive to diuresis, may require chest imaging. Discussed with Dr. Charlies Silvers. Will admit to hospitalist for ongoing management.  Forde Dandy, MD 09/19/15 [phone]

## 2015-09-19 NOTE — ED Notes (Signed)
Bed: WA21 Expected date:  Expected time:  Means of arrival:  Comments: 

## 2015-09-19 NOTE — H&P (Addendum)
[facility] History and Physical  Kimberly Peterson [id number] DOB: [birth year] DOA: 09/19/2015  Referring physician: ER physician: Dr. Brantley Stage PCP: [name], MD  Chief Complaint:  Shortness of breath  HPI:  82  -year-old female with past medical history significant for chronic diastolic heart failure, last 2-D echo in March 2015 with normal ejection fraction, has pacemaker,  History of COPD on 2 L home oxygen, hypertension, pulmonary embolism on anticoagulation with Coumadin.  Patient presented to [facility] with worsening shortness of breath at rest and with exertion for past 2 days prior to this admission. She says she has been compliant with Lasix at home. She takes 40 mg of Lasix every day. No reports of associated chest pain, palpitations.  Patient reports some abdominal discomfort and distention with poor by mouth intake but no nausea or vomiting. No diarrhea or constipation. No fevers, chills, cough. No reports of falls or lightheadedness or loss of consciousness. No reports of blood in the stool or urine.   in ED, patient was hemodynamically stable, afebrile, oxygen saturation 96% with nasal cannula oxygen support.  Blood work showed INR of 1.8,  BNP 19,  Normal troponin level. Blood work otherwise unremarkable. Chest x-ray showed pulmonary vascular congestion. Patient was given Lasix 40 mg IV in ED. She was admitted for further management of acute decompensated diastolic CHF.  Assessment & Plan    Principal Problem:   Acute on chronic respiratory failure with hypoxia (HCC) / Diastolic CHF, acute on chronic (HCC) -  Patient on home oxygen 2 L at baseline.  Shortness of breath likely because of acute decompensated CHF.  She is maintaining oxygen saturation above 95% with 2 L nasal cannula oxygen support. -  CHF order set utilized -  She was given Lasix 40 mg IV in emergency department in order placed for Lasix IV every 24 hours next dose to be given tomorrow -   Continue daily weight and strict intake and output -  Supplement potassium /electrolytes as needed -  At home she takes Coreg 25 mg twice daily but because of acute CHF will start 6.25 mg twice daily and titrate up   Active Problems:   Abdominal distention -  Will get abdominal x-ray to make sure there is no obstruction -  Patient did have one bowel movement yesterday -  Order placed for simethicone and milk of magnesia per patient request    COPD - On 2L home oxygen - Use duoneb every 4-6 hours as needed for shortness of breath or wheezing    Pulmonary embolism (HCC) -  Continue anticoagulation with Coumadin, dosing per pharmacy -  INR is 1.8    Benign essential HTN -  Resume hydralazine 25 mg 3 times daily -  Resume Coreg at 6.25 mg dose twice daily    Controlled diabetes mellitus without complication, with long-term current use of insulin (HCC) -  Last A1c about one year ago was 6.6 -  Continue Levemir but at the reduced dose compared to home dose until by mouth intake better -  Start sliding scale insulin  DVT prophylaxis:  - On full dose Anticoagulation with Coumadin  Radiological Exams on Admission: Dg Chest 2 View 09/19/2015  Pulmonary vascular congestion. No frank edema or consolidation. Stable appearing pacemaker leads. Electronically Signed   By: Lowella Grip III M.D.   On: 09/19/2015 09:45    EKG: I have personally reviewed EKG. EKG shows dual atrial paced rhythm   Code Status:  Full Family Communication: Plan of care discussed with the patient  Disposition Plan: Admit for further evaluation,telemetry due to acute CHF  Leisa Lenz, MD  [facility] Pager [phone]  Time spent in minutes: 75 minutes  Review of Systems:  Constitutional: Negative for fever, chills and malaise/fatigue. Negative for diaphoresis.  HENT: Negative for hearing loss, ear pain, nosebleeds, congestion, sore throat, neck pain, tinnitus and ear discharge.   Eyes: Negative for  blurred vision, double vision, photophobia, pain, discharge and redness.  Respiratory: per HPI Cardiovascular: Negative for chest pain, palpitations, orthopnea, claudication and leg swelling.  Gastrointestinal: Negative for nausea, vomiting and abdominal pain. Negative for heartburn, constipation, blood in stool and melena.  Genitourinary: Negative for dysuria, urgency, frequency, hematuria and flank pain.  Musculoskeletal: Negative for myalgias, back pain, joint pain and falls.  Skin: Negative for itching and rash.  Neurological: Negative for dizziness and weakness. Negative for tingling, tremors, sensory change, speech change, focal weakness, loss of consciousness and headaches.  Endo/Heme/Allergies: Negative for environmental allergies and polydipsia. Does not bruise/bleed easily.  Psychiatric/Behavioral: Negative for suicidal ideas. The patient is not nervous/anxious.      History reviewed. No pertinent past medical history. Past Surgical History  Procedure Laterality Date  . Valve replacement    . Pacemaker placement     Social History:  reports that she has never smoked. She has never used smokeless tobacco. She reports that she does not drink alcohol or use illicit drugs.  No Known Allergies  Family History: Hypertension in mother    Prior to Admission medications   Medication Sig Start Date End Date Taking? Authorizing Provider  aspirin 81 MG tablet Take 81 mg by mouth daily.   Yes Historical Provider, MD  bismuth subsalicylate (PEPTO BISMOL) 262 MG/15ML suspension Take 30 mLs by mouth every 6 (six) hours as needed for indigestion.   Yes Historical Provider, MD  carvedilol (COREG) 25 MG tablet Take 25 mg by mouth 2 (two) times daily.    Yes Historical Provider, MD  furosemide (LASIX) 40 MG tablet Take 0.5 tablets (20 mg total) by mouth every morning. Patient taking differently: Take 40 mg by mouth every morning.  09/29/13  Yes Maryann Mikhail, DO  hydrALAZINE (APRESOLINE) 25 MG  tablet Take 25 mg by mouth 3 (three) times daily.   Yes Historical Provider, MD  insulin aspart protamine- aspart (NOVOLOG MIX 70/30) (70-30) 100 UNIT/ML injection Inject 26-34 Units into the skin 2 (two) times daily with a meal. 34 units in morning and 26 units in the evening   Yes Historical Provider, MD  warfarin (COUMADIN) 5 MG tablet Take 1 tablet (5 mg total) by mouth daily. 09/29/13  Yes Cristal Ford, DO   Physical Exam: Filed Vitals:   09/19/15 0854 09/19/15 1056  BP: 188/79 186/74  Pulse: 80 70  Temp: 98.4 F (36.9 C)   TempSrc: Oral   Resp: 22 23  SpO2: 98% 96%    Physical Exam  Constitutional: Appears well-developed and well-nourished. No distress.  HENT: Normocephalic. No tonsillar erythema or exudates Eyes: Conjunctivae and EOM are normal. PERRLA, no scleral icterus.  Neck: Normal ROM. Neck supple. No JVD. No tracheal deviation. No thyromegaly.  CVS: RRR, S1/S2 +, no murmurs, no gallops, no carotid bruit.  Pulmonary: Effort and breath sounds normal, no stridor, rhonchi, wheezes, rales.  Abdominal: Soft. BS +,  no distension, tenderness, rebound or guarding.  Musculoskeletal: Normal range of motion. No edema and no tenderness.  Lymphadenopathy: No lymphadenopathy noted, cervical, inguinal.  Neuro: Alert. Normal reflexes, muscle tone coordination. No focal neurologic deficits. Skin: Skin is warm and dry. No rash noted.  No erythema. No pallor.  Psychiatric: Normal mood and affect. Behavior, judgment, thought content normal.   Labs on Admission:  Basic Metabolic Panel:  Recent Labs Lab 09/19/15 1041  NA 141  K 3.9  CL 103  CO2 28  GLUCOSE 121*  BUN 16  CREATININE 0.95  CALCIUM 9.2   Liver Function Tests:  Recent Labs Lab 09/19/15 1041  AST 24  ALT 17  ALKPHOS 78  BILITOT 0.5  PROT 8.0  ALBUMIN 3.8   No results for input(s): LIPASE, AMYLASE in the last 168 hours. No results for input(s): AMMONIA in the last 168 hours. CBC:  Recent Labs Lab  09/19/15 1041  WBC 8.4  NEUTROABS 6.5  HGB 12.1  HCT 40.4  MCV 91.2  PLT 230   Cardiac Enzymes: No results for input(s): CKTOTAL, CKMB, CKMBINDEX, TROPONINI in the last 168 hours. BNP: Invalid input(s): POCBNP CBG: No results for input(s): GLUCAP in the last 168 hours.  If 7PM-7AM, please contact night-coverage [email or web address] Password [facility] 09/19/2015, 12:08 PM

## 2015-09-19 NOTE — ED Notes (Signed)
Unsuccessful IV attempt x2.  Will consult IV team.  °

## 2015-09-19 NOTE — Progress Notes (Signed)
Pt has refused CPAP for the night.  Pt to notify RT if she decides to use hospital provided machine, RT to monitor and assess as needed.

## 2015-09-19 NOTE — ED Notes (Signed)
Pt presents via EMS from home with c/o bloating for 4 days, denies any N/V/D. Pt also c/o increased exertional shortness of breath for the past 2 days, wears O2 at home. Pt has a hx of COPD and CHF. O2 88% on RA for EMS, 94% on 2L for EMS. Pt reports she normally has some trouble breathing when she ambulates but that it is worse today.

## 2015-09-20 ENCOUNTER — Inpatient Hospital Stay (HOSPITAL_COMMUNITY): Payer: Medicare Other

## 2015-09-20 DIAGNOSIS — J9621 Acute and chronic respiratory failure with hypoxia: Secondary | ICD-10-CM

## 2015-09-20 DIAGNOSIS — J449 Chronic obstructive pulmonary disease, unspecified: Secondary | ICD-10-CM | POA: Diagnosis present

## 2015-09-20 DIAGNOSIS — I509 Heart failure, unspecified: Secondary | ICD-10-CM

## 2015-09-20 LAB — BASIC METABOLIC PANEL
Anion gap: 9 (ref 5–15)
BUN: 18 mg/dL (ref 6–20)
CHLORIDE: 99 mmol/L — AB (ref 101–111)
CO2: 30 mmol/L (ref 22–32)
CREATININE: 1.04 mg/dL — AB (ref 0.44–1.00)
Calcium: 8.6 mg/dL — ABNORMAL LOW (ref 8.9–10.3)
GFR calc non Af Amer: 48 mL/min — ABNORMAL LOW (ref >60)
GFR, EST AFRICAN AMERICAN: 56 mL/min — AB (ref >60)
Glucose, Bld: 143 mg/dL — ABNORMAL HIGH (ref 65–99)
Potassium: 3.8 mmol/L (ref 3.5–5.1)
Sodium: 138 mmol/L (ref 135–145)

## 2015-09-20 LAB — ECHOCARDIOGRAM COMPLETE
Height: 62 in
WEIGHTICAEL: 3534.41 [oz_av]

## 2015-09-20 LAB — HEMOGLOBIN A1C
Hgb A1c MFr Bld: 7.7 % — ABNORMAL HIGH (ref 4.8–5.6)
MEAN PLASMA GLUCOSE: 174 mg/dL

## 2015-09-20 LAB — PROTIME-INR
INR: 1.87 — AB (ref 0.00–1.49)
Prothrombin Time: 21.5 seconds — ABNORMAL HIGH (ref 11.6–15.2)

## 2015-09-20 LAB — TSH: TSH: 1.445 u[IU]/mL (ref 0.350–4.500)

## 2015-09-20 LAB — GLUCOSE, CAPILLARY
GLUCOSE-CAPILLARY: 158 mg/dL — AB (ref 65–99)
GLUCOSE-CAPILLARY: 220 mg/dL — AB (ref 65–99)
GLUCOSE-CAPILLARY: 243 mg/dL — AB (ref 65–99)

## 2015-09-20 LAB — BRAIN NATRIURETIC PEPTIDE: B Natriuretic Peptide: 157.6 pg/mL — ABNORMAL HIGH (ref 0.0–100.0)

## 2015-09-20 MED ORDER — PREDNISONE 5 MG PO TABS
30.0000 mg | ORAL_TABLET | Freq: Every day | ORAL | Status: DC
  Administered 2015-09-20 – 2015-09-21 (×2): 30 mg via ORAL
  Filled 2015-09-20 (×2): qty 2

## 2015-09-20 MED ORDER — ENOXAPARIN SODIUM 100 MG/ML ~~LOC~~ SOLN
1.0000 mg/kg | Freq: Two times a day (BID) | SUBCUTANEOUS | Status: DC
  Administered 2015-09-20 (×2): 100 mg via SUBCUTANEOUS
  Filled 2015-09-20 (×2): qty 1

## 2015-09-20 MED ORDER — DOXYCYCLINE HYCLATE 100 MG IV SOLR
100.0000 mg | Freq: Two times a day (BID) | INTRAVENOUS | Status: DC
  Administered 2015-09-20 – 2015-09-21 (×3): 100 mg via INTRAVENOUS
  Filled 2015-09-20 (×4): qty 100

## 2015-09-20 MED ORDER — PANTOPRAZOLE SODIUM 40 MG PO TBEC
40.0000 mg | DELAYED_RELEASE_TABLET | Freq: Every day | ORAL | Status: DC
  Administered 2015-09-20 – 2015-09-21 (×2): 40 mg via ORAL
  Filled 2015-09-20 (×2): qty 1

## 2015-09-20 MED ORDER — PERFLUTREN LIPID MICROSPHERE
1.0000 mL | INTRAVENOUS | Status: AC | PRN
  Administered 2015-09-20: 3 mL via INTRAVENOUS
  Filled 2015-09-20: qty 10

## 2015-09-20 MED ORDER — WARFARIN - PHARMACIST DOSING INPATIENT: Freq: Every day | Status: DC

## 2015-09-20 MED ORDER — WARFARIN SODIUM 6 MG PO TABS
7.0000 mg | ORAL_TABLET | Freq: Once | ORAL | Status: AC
  Administered 2015-09-20: 7 mg via ORAL
  Filled 2015-09-20: qty 1

## 2015-09-20 MED ORDER — FUROSEMIDE 40 MG PO TABS
40.0000 mg | ORAL_TABLET | Freq: Every morning | ORAL | Status: DC
  Administered 2015-09-21: 40 mg via ORAL
  Filled 2015-09-20: qty 1

## 2015-09-20 MED ORDER — PERFLUTREN LIPID MICROSPHERE
INTRAVENOUS | Status: AC
  Filled 2015-09-20: qty 10

## 2015-09-20 NOTE — Progress Notes (Signed)
Echocardiogram 2D Echocardiogram with Definity has been performed.  Tresa Res 09/20/2015, 10:11 AM

## 2015-09-20 NOTE — Progress Notes (Signed)
[facility] PROGRESS NOTE  Kimberly Peterson [id number] DOB: 12-12-1929 DOA: 09/19/2015 PCP: [name], MD  Summary 09/20/15: I have seen and examined Kimberly Peterson at bedside, spoke with [name] over the phone and reviewed her chart. Kimberly Peterson is a pleasant 82 -year-old female with chronic diastolic heart failure, last 2-D echo in March 2015 with normal ejection fraction, s/p pacemaker, COPD causing chronic hypoxic respiratory failure on 2 L home oxygen, essential hypertension, pulmonary embolism on anticoagulation with Coumadin, diabetes mellitus type 2, who presented to [facility] long hospital with worsening shortness of breath at rest and with exertion for past 2 days prior to this admission, this associated with chills. The concern was for acute decompensation of CHF at the time of admission. However, she has been compliant diet and medication wise and seems drive. She has evidence of acute exacerbation of COPD this morning, with wheezing. We'll therefore change Lasix to oral, start doxycycline and low dose prednisone. Her INR was subtherapeutic at the time of admission and it is improving(1.87 today). Appreciate pharmacy. Will add Lovenox for bridge to INR therapeutic(target 2-3). Patient feels a little bit better. Plan Acute on chronic respiratory failure with hypoxia (HCC)/COPD exacerbation (HCC)  Doxycycline  Prednisone  Bronchodilators/oxygen supplementation/PPI Pulmonary embolism ([place])  Lovenox/Coumadin for target INR 2-3 per pharmacy. Diastolic CHF, acute on chronic (HCC)/Benign essential HTN  Change Lasix to oral and continue home medications Controlled diabetes mellitus without complication, with long-term current use of insulin (HCC)  Generally controlled  Hemoglobin A1c 7.7  Continue SSI  Expect sugars to bump up some with steroids.  Code Status: Full Code Family Communication: Daughter Kimberly Peterson over the phone Disposition Plan: Hopefully home in  the next 48-72 hours if she does well   Consultants:  None  Procedures:  None  Antibiotics:  Doxycycline 09/20/2015>>  HPI/Subjective: Remains short of breath and has dry cough.  Objective: Filed Vitals:   09/19/15 2159 09/20/15 0611  BP: 160/61 149/56  Pulse: 71 70  Temp: 98.3 F (36.8 C) 98.2 F (36.8 C)  Resp: 20 18    Intake/Output Summary (Last 24 hours) at 09/20/15 1210 Last data filed at 09/19/15 1800  Gross per 24 hour  Intake      0 ml  Output    300 ml  Net   -300 ml   Filed Weights   09/19/15 1600 09/20/15 0500  Weight: 102.7 kg (226 lb 6.6 oz) 100.2 kg (220 lb 14.4 oz)    Exam:   General:  Comfortable at rest.  Cardiovascular: S1-S2 normal. No murmurs. Pulse regular.  Respiratory: Good air entry bilaterally. No rhonchi or rales. Bilateral wheezing.  Abdomen: Soft and nontender. Normal bowel sounds. No organomegaly.  Musculoskeletal: No pedal edema   Neurological: Intact  Data Reviewed: Basic Metabolic Panel:  Recent Labs Lab 09/19/15 1041 09/20/15 0448  NA 141 138  K 3.9 3.8  CL 103 99*  CO2 28 30  GLUCOSE 121* 143*  BUN 16 18  CREATININE 0.95 1.04*  CALCIUM 9.2 8.6*   Liver Function Tests:  Recent Labs Lab 09/19/15 1041  AST 24  ALT 17  ALKPHOS 78  BILITOT 0.5  PROT 8.0  ALBUMIN 3.8   No results for input(s): LIPASE, AMYLASE in the last 168 hours. No results for input(s): AMMONIA in the last 168 hours. CBC:  Recent Labs Lab 09/19/15 1041  WBC 8.4  NEUTROABS 6.5  HGB 12.1  HCT 40.4  MCV 91.2  PLT 230  Cardiac Enzymes: No results for input(s): CKTOTAL, CKMB, CKMBINDEX, TROPONINI in the last 168 hours. BNP (last 3 results)  Recent Labs  09/19/15 1041 09/20/15 0448  BNP 194.9* 157.6*    ProBNP (last 3 results) No results for input(s): PROBNP in the last 8760 hours.  CBG:  Recent Labs Lab 09/19/15 1715 09/19/15 2158 09/20/15 0823 09/20/15 1145  GLUCAP 177* 175* 158* 220*    No results  found for this or any previous visit (from the past 240 hour(s)).   Studies: Dg Chest 2 View  09/19/2015  CLINICAL DATA:  Shortness of breath, progressed over past 2 days EXAM: CHEST  2 VIEW COMPARISON:  Nov 21, 2013 FINDINGS: There is no edema or consolidation. There is stable cardiomegaly with pulmonary venous hypertension. Pacemaker leads are attached to the right atrium and right ventricle. No adenopathy. No pneumothorax. There is degenerative change in the thoracic spine and in the shoulder regions. IMPRESSION: Pulmonary vascular congestion. No frank edema or consolidation. Stable appearing pacemaker leads. Electronically Signed   By: Lowella Grip III M.D.   On: 09/19/2015 09:45   Dg Abd 1 View  09/19/2015  CLINICAL DATA:  Increasing abdominal distention and discomfort with gas seen is for 4 days. EXAM: ABDOMEN - 1 VIEW COMPARISON:  CT scan 11/21/2013.  Acute abdomen series 3 10/24/2013. FINDINGS: Diffuse gas-filled small bowel loops are evident without substantial distention. Air and stool are scattered along the course of a mildly distended colon. Multiple calcifications overlying the central anatomic pelvis likely related to fibroids. The visualized bony anatomy is unremarkable. IMPRESSION: No evidence for bowel obstruction with nonspecific bowel gas pattern. Electronically Signed   By: Misty Stanley M.D.   On: 09/19/2015 13:09    Scheduled Meds: . aspirin EC  81 mg Oral Daily  . carvedilol  6.25 mg Oral BID WC  . doxycycline (VIBRAMYCIN) IV  100 mg Intravenous Q12H  . [START ON 09/21/2015] furosemide  40 mg Oral q morning - 10a  . hydrALAZINE  25 mg Oral TID  . insulin aspart  0-5 Units Subcutaneous QHS  . insulin aspart  0-9 Units Subcutaneous TID WC  . insulin aspart protamine- aspart  12 Units Subcutaneous BID WC  . pantoprazole  40 mg Oral Q1200  . predniSONE  30 mg Oral Q breakfast  . sodium chloride flush  3 mL Intravenous Q12H  . warfarin  5 mg Oral q1800  . Warfarin -  Physician Dosing Inpatient   Does not apply q1800   Continuous Infusions:    Time spent: 25 minutes    Jerad Dunlap  [facility] Pager [phone]. If 7PM-7AM, please contact night-coverage at [email or web address], password [email or web address] 09/20/2015, 12:10 PM  LOS: 1 day

## 2015-09-20 NOTE — Progress Notes (Signed)
Pt refused cpap again tonight.  Pt was advised that RT is available all night should she change her mind. 

## 2015-09-20 NOTE — Progress Notes (Signed)
ANTICOAGULATION CONSULT NOTE - Initial Consult  Pharmacy Consult for warfarin  Indication: pulmonary embolus  No Known Allergies  Patient Measurements: Height: 5\' 2"  (157.5 cm) Weight: 220 lb 14.4 oz (100.2 kg) IBW/kg (Calculated) : 50.1 Heparin Dosing Weight:   Vital Signs: Temp: 98.2 F (36.8 C) (03/16 0611) Temp Source: Oral (03/16 0611) BP: 149/56 mmHg (03/16 0611) Pulse Rate: 70 (03/16 0611)  Labs:  Recent Labs  09/19/15 1041 09/20/15 0448  HGB 12.1  --   HCT 40.4  --   PLT 230  --   LABPROT 20.9* 21.5*  INR 1.80* 1.87*  CREATININE 0.95 1.04*    Estimated Creatinine Clearance: 45.4 mL/min (by C-G formula based on Cr of 1.04).   Medical History: Past Medical History  Diagnosis Date  . Hypertension   . Diabetes mellitus without complication ([place])     Medications:  Prescriptions prior to admission  Medication Sig Dispense Refill Last Dose  . aspirin 81 MG tablet Take 81 mg by mouth daily.   09/18/2015 at 0930  . bismuth subsalicylate (PEPTO BISMOL) 262 MG/15ML suspension Take 30 mLs by mouth every 6 (six) hours as needed for indigestion.   unknown  . carvedilol (COREG) 25 MG tablet Take 25 mg by mouth 2 (two) times daily.    09/18/2015 at 2000  . furosemide (LASIX) 40 MG tablet Take 0.5 tablets (20 mg total) by mouth every morning. (Patient taking differently: Take 40 mg by mouth every morning. ) 30 tablet 0 09/18/2015 at Unknown time  . hydrALAZINE (APRESOLINE) 25 MG tablet Take 25 mg by mouth 3 (three) times daily.   09/18/2015 at Unknown time  . insulin aspart protamine- aspart (NOVOLOG MIX 70/30) (70-30) 100 UNIT/ML injection Inject 26-34 Units into the skin 2 (two) times daily with a meal. 34 units in morning and 26 units in the evening   09/18/2015 at Unknown time  . warfarin (COUMADIN) 5 MG tablet Take 1 tablet (5 mg total) by mouth daily. 30 tablet 0 09/18/2015 at 1800    Assessment: Pt is a 82 yo female with history of PE. Pt home regimen is warfarin 5 mg  PO daily. Pt has been admitted with shortness of breath. Pt has also been ordered enoxaparin full dose treatment. Spoke to Dr. Sanjuana Letters regarding the order as INR is 1.87. Due to admission being shortness of breath, would like to cover possibility of PE and plan to stop enoxaparin once INR is greater than 2.   09/20/2015   CBC WNL  Pt received warfarin 5 mg PO on 3/15 as per home regimen  No noted signs of bleeding   Goal of Therapy:  INR 2-3 Monitor platelets by anticoagulation protocol: Yes   Plan:  Warfarin 7 mg PO x1  CBC/INR ordered for AM labs F/u INR, need for enoxaparin, signs and symptoms of bleeding.     Royetta Asal, PharmD, BCPS Pager [phone] 09/20/2015 1:02 PM

## 2015-09-21 LAB — BASIC METABOLIC PANEL
ANION GAP: 11 (ref 5–15)
BUN: 22 mg/dL — ABNORMAL HIGH (ref 6–20)
CO2: 31 mmol/L (ref 22–32)
Calcium: 9.4 mg/dL (ref 8.9–10.3)
Chloride: 98 mmol/L — ABNORMAL LOW (ref 101–111)
Creatinine, Ser: 1.1 mg/dL — ABNORMAL HIGH (ref 0.44–1.00)
GFR, EST AFRICAN AMERICAN: 52 mL/min — AB (ref >60)
GFR, EST NON AFRICAN AMERICAN: 45 mL/min — AB (ref >60)
Glucose, Bld: 251 mg/dL — ABNORMAL HIGH (ref 65–99)
POTASSIUM: 5.1 mmol/L (ref 3.5–5.1)
SODIUM: 140 mmol/L (ref 135–145)

## 2015-09-21 LAB — CBC
HEMATOCRIT: 40 % (ref 36.0–46.0)
HEMOGLOBIN: 12.7 g/dL (ref 12.0–15.0)
MCH: 27.7 pg (ref 26.0–34.0)
MCHC: 31.8 g/dL (ref 30.0–36.0)
MCV: 87.3 fL (ref 78.0–100.0)
Platelets: 249 10*3/uL (ref 150–400)
RBC: 4.58 MIL/uL (ref 3.87–5.11)
RDW: 13.8 % (ref 11.5–15.5)
WBC: 9.1 10*3/uL (ref 4.0–10.5)

## 2015-09-21 LAB — GLUCOSE, CAPILLARY
GLUCOSE-CAPILLARY: 253 mg/dL — AB (ref 65–99)
GLUCOSE-CAPILLARY: 235 mg/dL — AB (ref 65–99)
Glucose-Capillary: 208 mg/dL — ABNORMAL HIGH (ref 65–99)
Glucose-Capillary: 260 mg/dL — ABNORMAL HIGH (ref 65–99)

## 2015-09-21 LAB — PROTIME-INR
INR: 2.04 — ABNORMAL HIGH (ref 0.00–1.49)
PROTHROMBIN TIME: 22.9 s — AB (ref 11.6–15.2)

## 2015-09-21 MED ORDER — WARFARIN SODIUM 6 MG PO TABS
6.0000 mg | ORAL_TABLET | Freq: Once | ORAL | Status: AC
  Administered 2015-09-21: 6 mg via ORAL
  Filled 2015-09-21 (×2): qty 1

## 2015-09-21 MED ORDER — PANTOPRAZOLE SODIUM 40 MG PO TBEC: 40.0000 mg | DELAYED_RELEASE_TABLET | Freq: Every day | ORAL | Status: DC

## 2015-09-21 MED ORDER — PREDNISONE 10 MG PO TABS: ORAL_TABLET | ORAL | Status: DC

## 2015-09-21 MED ORDER — DOXYCYCLINE HYCLATE 50 MG PO CAPS: 50.0000 mg | ORAL_CAPSULE | Freq: Two times a day (BID) | ORAL | Status: DC

## 2015-09-21 NOTE — Progress Notes (Signed)
Pt declined HH at present time.  

## 2015-09-21 NOTE — Evaluation (Signed)
Physical Therapy Evaluation Patient Details Name: Kimberly Peterson MRN: [id number] DOB: 31-Oct-1929 Today's Date: 09/21/2015   History of Present Illness  82 -year-old female with past medical history significant for chronic diastolic heart failure, pacemaker, COPD on 2 L home oxygen, hypertension, pulmonary embolism on anticoagulation and admitted for acute on chronic respiratory failure, CHF, and COPD exacerbation  Clinical Impression  Pt admitted with above diagnosis. Pt currently with functional limitations due to the deficits listed below (see PT Problem List).  Pt will benefit from skilled PT to increase their independence and safety with mobility to allow discharge to the venue listed below.   Pt mobilizing well however SpO2 dropped to 86% on 2L O2 during gait (see below for details).  Pt agreeable to HHPT upon d/c.   Follow Up Recommendations Home health PT    Equipment Recommendations  None recommended by PT    Recommendations for Other Services       Precautions / Restrictions Precautions Precautions: Fall Precaution Comments: chronic 2L O2, monitor sats Restrictions Weight Bearing Restrictions: No      Mobility  Bed Mobility               General bed mobility comments: pt up in recliner on arrival  Transfers Overall transfer level: Needs assistance Equipment used: Rolling walker (2 wheeled) Transfers: Sit to/from Stand Sit to Stand: Min guard         General transfer comment: verbal cues for safety, lines  Ambulation/Gait Ambulation/Gait assistance: Min guard Ambulation Distance (Feet): 120 Feet Assistive device: Rolling walker (2 wheeled) Gait Pattern/deviations: Step-through pattern;Trunk flexed     General Gait Details: verbal cues for RW positioning, SpO2 dropped to 86% on 2L O2 so increased to 3L O2 and SPO2 90%  Stairs            Wheelchair Mobility    Modified Rankin (Stroke Patients Only)       Balance                                              Pertinent Vitals/Pain Pain Assessment: No/denies pain    Home Living Family/patient expects to be discharged to:: Private residence Living Arrangements: Children (daughter) Available Help at Discharge: Family;Available PRN/intermittently (daughter works) Type of Home: House Home Access: Level entry     Home Layout: Able to live on main level with bedroom/bathroom Home Equipment: Walker - 4 wheels      Prior Function Level of Independence: Independent with assistive device(s);Needs assistance   Gait / Transfers Assistance Needed: ambulates with rollator  ADL's / Homemaking Assistance Needed: daughter prepares meals        Hand Dominance        Extremity/Trunk Assessment               Lower Extremity Assessment: Generalized weakness      Cervical / Trunk Assessment: Other exceptions;Kyphotic  Communication   Communication: HOH  Cognition Arousal/Alertness: Awake/alert Behavior During Therapy: WFL for tasks assessed/performed Overall Cognitive Status: Within Functional Limits for tasks assessed                      General Comments      Exercises        Assessment/Plan    PT Assessment Patient needs continued PT services  PT Diagnosis Difficulty walking   PT Problem  List Cardiopulmonary status limiting activity;Decreased activity tolerance;Decreased mobility  PT Treatment Interventions DME instruction;Gait training;Functional mobility training;Patient/family education;Therapeutic activities;Therapeutic exercise   PT Goals (Current goals can be found in the Care Plan section) Acute Rehab PT Goals PT Goal Formulation: With patient Time For Goal Achievement: 10/05/15 Potential to Achieve Goals: Good    Frequency Min 3X/week   Barriers to discharge        Co-evaluation               End of Session Equipment Utilized During Treatment: Oxygen Activity Tolerance: Patient tolerated treatment  well Patient left: in chair;with chair alarm set;with call bell/phone within reach           Time: 0927-0941 PT Time Calculation (min) (ACUTE ONLY): 14 min   Charges:   PT Evaluation $PT Eval Low Complexity: 1 Procedure     PT G Codes:        Demontre Peterson,Kimberly E 09/21/2015, 12:13 PM Carmelia Bake, PT, DPT 09/21/2015 Pager: [phone]

## 2015-09-21 NOTE — Progress Notes (Addendum)
ANTICOAGULATION CONSULT NOTE - Initial Consult  Pharmacy Consult for warfarin  Indication: pulmonary embolus  No Known Allergies  Patient Measurements: Height: 5\' 2"  (157.5 cm) Weight: 220 lb 8 oz (100.018 kg) IBW/kg (Calculated) : 50.1 Heparin Dosing Weight:   Vital Signs: Temp: 97.9 F (36.6 C) (03/17 0436) Temp Source: Oral (03/17 0436) BP: 188/93 mmHg (03/17 0436) Pulse Rate: 90 (03/17 0436)  Labs:  Recent Labs  09/19/15 1041 09/20/15 0448 09/21/15 0439  HGB 12.1  --  12.7  HCT 40.4  --  40.0  PLT 230  --  249  LABPROT 20.9* 21.5* 22.9*  INR 1.80* 1.87* 2.04*  CREATININE 0.95 1.04* 1.10*    Estimated Creatinine Clearance: 42.9 mL/min (by C-G formula based on Cr of 1.1).   Medical History: Past Medical History  Diagnosis Date  . Hypertension   . Diabetes mellitus without complication ([place])     Medications:  Prescriptions prior to admission  Medication Sig Dispense Refill Last Dose  . aspirin 81 MG tablet Take 81 mg by mouth daily.   09/18/2015 at 0930  . bismuth subsalicylate (PEPTO BISMOL) 262 MG/15ML suspension Take 30 mLs by mouth every 6 (six) hours as needed for indigestion.   unknown  . carvedilol (COREG) 25 MG tablet Take 25 mg by mouth 2 (two) times daily.    09/18/2015 at 2000  . furosemide (LASIX) 40 MG tablet Take 0.5 tablets (20 mg total) by mouth every morning. (Patient taking differently: Take 40 mg by mouth every morning. ) 30 tablet 0 09/18/2015 at Unknown time  . hydrALAZINE (APRESOLINE) 25 MG tablet Take 25 mg by mouth 3 (three) times daily.   09/18/2015 at Unknown time  . insulin aspart protamine- aspart (NOVOLOG MIX 70/30) (70-30) 100 UNIT/ML injection Inject 26-34 Units into the skin 2 (two) times daily with a meal. 34 units in morning and 26 units in the evening   09/18/2015 at Unknown time  . warfarin (COUMADIN) 5 MG tablet Take 1 tablet (5 mg total) by mouth daily. 30 tablet 0 09/18/2015 at 1800    Assessment: Pt is a 82 yo female with  history of PE. Pt home regimen is warfarin 5 mg PO daily. Pt has been admitted with shortness of breath. Pt has also been ordered enoxaparin full dose treatment. Spoke to Dr. Sanjuana Letters regarding the order as INR is 1.87. Due to admission being shortness of breath, would like to cover possibility of PE and plan to stop enoxaparin once INR is greater than 2.   09/21/2015 :  INR therapeutic this AM.  CBC remains wnl. No bleeding reported/documented. Meal intake: good. Potential drug-interactions: Prednisone and Doxycycline can increase INR response.   Goal of Therapy:  INR 2-3 Monitor platelets by anticoagulation protocol: Yes   Plan:  DC Lovenox now that INR is >2. Give warfarin 6mg  PO x1 at 1800.  Check PT/INR daily.  Romeo Rabon, PharmD, pager [phone]. 09/21/2015,7:22 AM.

## 2015-09-21 NOTE — Discharge Summary (Signed)
Kimberly Peterson, is a 82 y.o. female  DOB 1930/06/08  MRN [id number].  Admission date:  09/19/2015  Admitting Physician  Robbie Lis, MD  Discharge Date:  09/21/2015   Primary MD  [name], MD  Recommendations for primary care physician for things to follow:  Refer to cardiology for follow-up of CHF  Admission Diagnosis   Abdominal discomfort [R10.9] Acute on chronic diastolic congestive heart failure ([place]) [I50.33]   Discharge Diagnosis  Abdominal discomfort [R10.9] Acute on chronic diastolic congestive heart failure ([name]) [I50.33]   Principal Problem:   Acute on chronic respiratory failure with hypoxia ([place]) Active Problems:   COPD exacerbation ([place])   Pulmonary embolism (HCC)   Diastolic CHF, acute on chronic (HCC)   Benign essential HTN   Controlled diabetes mellitus without complication, with long-term current use of insulin [facility])      Hospital Course  Kimberly Peterson is a pleasant 73 -year-old female with chronic diastolic and systolic heart failure, EF 40-45% s/p pacemaker, COPD causing chronic hypoxic respiratory failure on 2 L home oxygen, essential hypertension, pulmonary embolism on anticoagulation with Coumadin, diabetes mellitus type 2, who presented to [facility] long hospital with worsening shortness of breath at rest and with exertion for past 2 days prior to this admission, this associated with chills. The concern was for acute decompensation of CHF at the time of admission therefore she was placed on IV Lasix. Patient has been compliant diet and medication wise. She had evidence of acute exacerbation of COPD during the course of the admission, with wheezing which responded to systemic steroids/bronchodilators/empiric antibiotics. Her INR was subtherapeutic at 1.87 at the time of admission but  improved to 2.04 prior to discharge. 2-D echocardiogram showed inferior hypokinesis was chronicity is not clear. Troponin was negative. Patient feels better with interventions instituted since admission and is eager to go home. We'll therefore discharge her to continue cardiac medications including Coreg/Coumadin/Lasix, short course of prednisone and doxycycline to follow with her PCP next week as scheduled. She will need cardiology follow-up outpatient. I have discussed discharge plan with patient's daughter [name] over the phone.  Discharge Condition Stable.  Consults obtained  None  Follow UP PCP Cardiology   Discharge Instructions  and  Discharge Medications  Discharge Instructions    Diet - low sodium heart healthy    Complete by:  As directed      Diet Carb Modified    Complete by:  As directed      Discharge instructions    Complete by:  As directed   Follow PCP in 1 week     Increase activity slowly    Complete by:  As directed             Medication List    TAKE these medications        aspirin 81 MG tablet  Take 81 mg by mouth daily.     bismuth subsalicylate [id number] suspension  Commonly known as:  PEPTO BISMOL  Take 30 mLs by  mouth every 6 (six) hours as needed for indigestion.     carvedilol 25 MG tablet  Commonly known as:  COREG  Take 25 mg by mouth 2 (two) times daily.     doxycycline 50 MG capsule  Commonly known as:  VIBRAMYCIN  Take 1 capsule (50 mg total) by mouth 2 (two) times daily.     furosemide 40 MG tablet  Commonly known as:  LASIX  Take 0.5 tablets (20 mg total) by mouth every morning.     hydrALAZINE 25 MG tablet  Commonly known as:  APRESOLINE  Take 25 mg by mouth 3 (three) times daily.     insulin aspart protamine- aspart (70-30) 100 UNIT/ML injection  Commonly known as:  NOVOLOG MIX 70/30  Inject 26-34 Units into the skin 2 (two) times daily with a meal. 34 units in morning and 26 units in the evening     pantoprazole 40  MG tablet  Commonly known as:  PROTONIX  Take 1 tablet (40 mg total) by mouth daily.     predniSONE 10 MG tablet  Commonly known as:  DELTASONE  Please take 3 tablets daily for 1 day, then 2 tablets daily for 2 days, then 1 tablet daily for 2 days. Total 9 Tablets     warfarin 5 MG tablet  Commonly known as:  COUMADIN  Take 1 tablet (5 mg total) by mouth daily.        Diet and Activity recommendation: See Discharge Instructions above  Major procedures and Radiology Reports - PLEASE review detailed and final reports for all details, in brief -    Dg Chest 2 View  09/19/2015  CLINICAL DATA:  Shortness of breath, progressed over past 2 days EXAM: CHEST  2 VIEW COMPARISON:  Nov 21, 2013 FINDINGS: There is no edema or consolidation. There is stable cardiomegaly with pulmonary venous hypertension. Pacemaker leads are attached to the right atrium and right ventricle. No adenopathy. No pneumothorax. There is degenerative change in the thoracic spine and in the shoulder regions. IMPRESSION: Pulmonary vascular congestion. No frank edema or consolidation. Stable appearing pacemaker leads. Electronically Signed   By: Lowella Grip III M.D.   On: 09/19/2015 09:45   Dg Abd 1 View  09/19/2015  CLINICAL DATA:  Increasing abdominal distention and discomfort with gas seen is for 4 days. EXAM: ABDOMEN - 1 VIEW COMPARISON:  CT scan 11/21/2013.  Acute abdomen series 3 10/24/2013. FINDINGS: Diffuse gas-filled small bowel loops are evident without substantial distention. Air and stool are scattered along the course of a mildly distended colon. Multiple calcifications overlying the central anatomic pelvis likely related to fibroids. The visualized bony anatomy is unremarkable. IMPRESSION: No evidence for bowel obstruction with nonspecific bowel gas pattern. Electronically Signed   By: Misty Stanley M.D.   On: 09/19/2015 13:09    Micro Results   No results found for this or any previous visit (from the past  240 hour(s)).     Today   Subjective:   Kimberly Peterson denies any complaints..   Objective:   Blood pressure 159/58, pulse 74, temperature 98.2 F (36.8 C), temperature source Oral, resp. rate 20, height 5\' 2"  (1.575 m), weight 100.018 kg (220 lb 8 oz), SpO2 100 %.   Intake/Output Summary (Last 24 hours) at 09/21/15 1612 Last data filed at 09/21/15 1300  Gross per 24 hour  Intake    970 ml  Output    501 ml  Net    469 ml  Exam Sitting up in chair, comfortable.  Data Review   CBC w Diff: Lab Results  Component Value Date   WBC 9.1 09/21/2015   WBC 6.8 09/10/2013   HGB 12.7 09/21/2015   HGB 9.9* 09/10/2013   HCT 40.0 09/21/2015   HCT 30.9* 09/10/2013   PLT 249 09/21/2015   PLT 277 09/10/2013   LYMPHOPCT 15 09/19/2015   LYMPHOPCT 17.9 09/10/2013   MONOPCT 7 09/19/2015   MONOPCT 9.7 09/10/2013   EOSPCT 1 09/19/2015   EOSPCT 3.4 09/10/2013   BASOPCT 0 09/19/2015   BASOPCT 0.6 09/10/2013    CMP: Lab Results  Component Value Date   NA 140 09/21/2015   NA 136 09/10/2013   K 5.1 09/21/2015   K 4.3 09/10/2013   CL 98* 09/21/2015   CL 99 09/10/2013   CO2 31 09/21/2015   CO2 35* 09/10/2013   BUN 22* 09/21/2015   BUN 12 09/10/2013   CREATININE 1.10* 09/21/2015   CREATININE 1.04 09/10/2013   PROT 8.0 09/19/2015   PROT 7.0 09/09/2013   ALBUMIN 3.8 09/19/2015   ALBUMIN 2.6* 09/09/2013   BILITOT 0.5 09/19/2015   BILITOT 0.4 09/09/2013   ALKPHOS 78 09/19/2015   ALKPHOS 61 09/09/2013   AST 24 09/19/2015   AST 19 09/09/2013   ALT 17 09/19/2015   ALT 12 09/09/2013  .   Total Time in preparing paper work, data evaluation and todays exam -  25 minutes  Eran Mistry M.D on 09/21/2015 at [facility] Office  [phone]

## 2015-09-25 DIAGNOSIS — I480 Paroxysmal atrial fibrillation: Secondary | ICD-10-CM | POA: Diagnosis not present

## 2015-09-25 DIAGNOSIS — N182 Chronic kidney disease, stage 2 (mild): Secondary | ICD-10-CM | POA: Diagnosis not present

## 2015-09-25 DIAGNOSIS — E1165 Type 2 diabetes mellitus with hyperglycemia: Secondary | ICD-10-CM | POA: Diagnosis not present

## 2015-09-25 DIAGNOSIS — Z7901 Long term (current) use of anticoagulants: Secondary | ICD-10-CM | POA: Diagnosis not present

## 2015-09-25 DIAGNOSIS — J9691 Respiratory failure, unspecified with hypoxia: Secondary | ICD-10-CM | POA: Diagnosis not present

## 2015-09-25 DIAGNOSIS — E782 Mixed hyperlipidemia: Secondary | ICD-10-CM | POA: Diagnosis not present

## 2015-09-25 DIAGNOSIS — I110 Hypertensive heart disease with heart failure: Secondary | ICD-10-CM | POA: Diagnosis not present

## 2015-10-23 DIAGNOSIS — I359 Nonrheumatic aortic valve disorder, unspecified: Secondary | ICD-10-CM | POA: Diagnosis not present

## 2015-10-23 DIAGNOSIS — I480 Paroxysmal atrial fibrillation: Secondary | ICD-10-CM | POA: Diagnosis not present

## 2015-10-23 DIAGNOSIS — E782 Mixed hyperlipidemia: Secondary | ICD-10-CM | POA: Diagnosis not present

## 2015-10-23 DIAGNOSIS — I5033 Acute on chronic diastolic (congestive) heart failure: Secondary | ICD-10-CM | POA: Diagnosis not present

## 2015-10-23 DIAGNOSIS — K219 Gastro-esophageal reflux disease without esophagitis: Secondary | ICD-10-CM | POA: Diagnosis not present

## 2015-10-23 DIAGNOSIS — E1165 Type 2 diabetes mellitus with hyperglycemia: Secondary | ICD-10-CM | POA: Diagnosis not present

## 2015-10-23 DIAGNOSIS — Z0001 Encounter for general adult medical examination with abnormal findings: Secondary | ICD-10-CM | POA: Diagnosis not present

## 2015-10-24 DIAGNOSIS — E113523 Type 2 diabetes mellitus with proliferative diabetic retinopathy with traction retinal detachment involving the macula, bilateral: Secondary | ICD-10-CM | POA: Diagnosis not present

## 2015-10-29 ENCOUNTER — Ambulatory Visit: Payer: Medicare Other | Admitting: Podiatry

## 2015-12-03 ENCOUNTER — Emergency Department: Payer: Medicare Other

## 2015-12-03 ENCOUNTER — Encounter: Payer: Self-pay | Admitting: Emergency Medicine

## 2015-12-03 ENCOUNTER — Inpatient Hospital Stay
Admission: EM | Admit: 2015-12-03 | Discharge: 2015-12-06 | DRG: 291 | Disposition: A | Discharge status: Home / Self Care | Payer: Medicare Other | Attending: Internal Medicine | Admitting: Internal Medicine

## 2015-12-03 DIAGNOSIS — J441 Chronic obstructive pulmonary disease with (acute) exacerbation: Secondary | ICD-10-CM | POA: Diagnosis not present

## 2015-12-03 DIAGNOSIS — J9620 Acute and chronic respiratory failure, unspecified whether with hypoxia or hypercapnia: Secondary | ICD-10-CM

## 2015-12-03 DIAGNOSIS — N179 Acute kidney failure, unspecified: Secondary | ICD-10-CM | POA: Diagnosis not present

## 2015-12-03 DIAGNOSIS — I509 Heart failure, unspecified: Secondary | ICD-10-CM | POA: Diagnosis not present

## 2015-12-03 DIAGNOSIS — J9621 Acute and chronic respiratory failure with hypoxia: Secondary | ICD-10-CM | POA: Diagnosis present

## 2015-12-03 DIAGNOSIS — Z7901 Long term (current) use of anticoagulants: Secondary | ICD-10-CM

## 2015-12-03 DIAGNOSIS — R069 Unspecified abnormalities of breathing: Secondary | ICD-10-CM | POA: Diagnosis not present

## 2015-12-03 DIAGNOSIS — Z7952 Long term (current) use of systemic steroids: Secondary | ICD-10-CM

## 2015-12-03 DIAGNOSIS — I10 Essential (primary) hypertension: Secondary | ICD-10-CM | POA: Diagnosis present

## 2015-12-03 DIAGNOSIS — E119 Type 2 diabetes mellitus without complications: Secondary | ICD-10-CM

## 2015-12-03 DIAGNOSIS — Z79899 Other long term (current) drug therapy: Secondary | ICD-10-CM

## 2015-12-03 DIAGNOSIS — Z794 Long term (current) use of insulin: Secondary | ICD-10-CM

## 2015-12-03 DIAGNOSIS — Z6841 Body Mass Index (BMI) 40.0 and over, adult: Secondary | ICD-10-CM | POA: Diagnosis not present

## 2015-12-03 DIAGNOSIS — I5023 Acute on chronic systolic (congestive) heart failure: Secondary | ICD-10-CM | POA: Diagnosis present

## 2015-12-03 DIAGNOSIS — Z952 Presence of prosthetic heart valve: Secondary | ICD-10-CM

## 2015-12-03 DIAGNOSIS — J811 Chronic pulmonary edema: Secondary | ICD-10-CM | POA: Diagnosis not present

## 2015-12-03 DIAGNOSIS — Z833 Family history of diabetes mellitus: Secondary | ICD-10-CM

## 2015-12-03 DIAGNOSIS — E662 Morbid (severe) obesity with alveolar hypoventilation: Secondary | ICD-10-CM | POA: Diagnosis present

## 2015-12-03 DIAGNOSIS — R748 Abnormal levels of other serum enzymes: Secondary | ICD-10-CM | POA: Diagnosis present

## 2015-12-03 DIAGNOSIS — Z950 Presence of cardiac pacemaker: Secondary | ICD-10-CM

## 2015-12-03 DIAGNOSIS — Z8249 Family history of ischemic heart disease and other diseases of the circulatory system: Secondary | ICD-10-CM

## 2015-12-03 DIAGNOSIS — I110 Hypertensive heart disease with heart failure: Secondary | ICD-10-CM | POA: Diagnosis not present

## 2015-12-03 DIAGNOSIS — I5033 Acute on chronic diastolic (congestive) heart failure: Secondary | ICD-10-CM

## 2015-12-03 DIAGNOSIS — Z86711 Personal history of pulmonary embolism: Secondary | ICD-10-CM

## 2015-12-03 DIAGNOSIS — Z7982 Long term (current) use of aspirin: Secondary | ICD-10-CM

## 2015-12-03 LAB — COMPREHENSIVE METABOLIC PANEL
ALBUMIN: 3.8 g/dL (ref 3.5–5.0)
ALK PHOS: 99 U/L (ref 38–126)
ALT: 34 U/L (ref 14–54)
ANION GAP: 11 (ref 5–15)
AST: 61 U/L — ABNORMAL HIGH (ref 15–41)
BILIRUBIN TOTAL: 0.7 mg/dL (ref 0.3–1.2)
BUN: 20 mg/dL (ref 6–20)
CALCIUM: 8.9 mg/dL (ref 8.9–10.3)
CO2: 26 mmol/L (ref 22–32)
Chloride: 102 mmol/L (ref 101–111)
Creatinine, Ser: 1.05 mg/dL — ABNORMAL HIGH (ref 0.44–1.00)
GFR, EST AFRICAN AMERICAN: 55 mL/min — AB (ref >60)
GFR, EST NON AFRICAN AMERICAN: 47 mL/min — AB (ref >60)
Glucose, Bld: 215 mg/dL — ABNORMAL HIGH (ref 65–99)
POTASSIUM: 3.8 mmol/L (ref 3.5–5.1)
Sodium: 139 mmol/L (ref 135–145)
TOTAL PROTEIN: 8.1 g/dL (ref 6.5–8.1)

## 2015-12-03 LAB — BLOOD GAS, ARTERIAL
Acid-Base Excess: 5.9 mmol/L — ABNORMAL HIGH (ref 0.0–3.0)
Allens test (pass/fail): POSITIVE — AB
BICARBONATE: 32.5 meq/L — AB (ref 21.0–28.0)
Delivery systems: POSITIVE
EXPIRATORY PAP: 5
FIO2: 0.4
INSPIRATORY PAP: 15
O2 SAT: 98.5 %
PH ART: 7.38 (ref 7.350–7.450)
PO2 ART: 117 mmHg — AB (ref 83.0–108.0)
Patient temperature: 37
pCO2 arterial: 55 mmHg — ABNORMAL HIGH (ref 32.0–48.0)

## 2015-12-03 LAB — TROPONIN I: TROPONIN I: 0.04 ng/mL — AB (ref <0.031)

## 2015-12-03 LAB — CBC
HEMATOCRIT: 37.2 % (ref 35.0–47.0)
Hemoglobin: 12.1 g/dL (ref 12.0–16.0)
MCH: 27.6 pg (ref 26.0–34.0)
MCHC: 32.6 g/dL (ref 32.0–36.0)
MCV: 84.8 fL (ref 80.0–100.0)
Platelets: 255 10*3/uL (ref 150–440)
RBC: 4.39 MIL/uL (ref 3.80–5.20)
RDW: 15.5 % — AB (ref 11.5–14.5)
WBC: 12.2 10*3/uL — ABNORMAL HIGH (ref 3.6–11.0)

## 2015-12-03 LAB — BRAIN NATRIURETIC PEPTIDE: B Natriuretic Peptide: 177 pg/mL — ABNORMAL HIGH (ref 0.0–100.0)

## 2015-12-03 MED ORDER — FUROSEMIDE 10 MG/ML IJ SOLN
80.0000 mg | Freq: Once | INTRAMUSCULAR | Status: AC
  Administered 2015-12-03: 80 mg via INTRAVENOUS
  Filled 2015-12-03: qty 8

## 2015-12-03 MED ORDER — IPRATROPIUM-ALBUTEROL 0.5-2.5 (3) MG/3ML IN SOLN
3.0000 mL | Freq: Once | RESPIRATORY_TRACT | Status: AC
  Administered 2015-12-03: 3 mL via RESPIRATORY_TRACT
  Filled 2015-12-03: qty 3

## 2015-12-03 MED ORDER — IPRATROPIUM-ALBUTEROL 0.5-2.5 (3) MG/3ML IN SOLN
3.0000 mL | Freq: Once | RESPIRATORY_TRACT | Status: AC
Start: 2015-12-03 — End: 2015-12-03
  Administered 2015-12-03: 3 mL via RESPIRATORY_TRACT
  Filled 2015-12-03: qty 3

## 2015-12-03 MED ORDER — NITROGLYCERIN 2 % TD OINT
0.5000 [in_us] | TOPICAL_OINTMENT | Freq: Four times a day (QID) | TRANSDERMAL | Status: DC
  Administered 2015-12-03: 0.5 [in_us] via TOPICAL
  Filled 2015-12-03: qty 1

## 2015-12-03 NOTE — ED Notes (Signed)
RT at bedside to place patient on BIPAP

## 2015-12-03 NOTE — ED Notes (Signed)
X RAY at bedside 

## 2015-12-03 NOTE — ED Notes (Signed)
Per [facility], patient comes from home with respiratory distress. Hx of COPD, CHF, Diabetes, HTN. Patient stopped using her CPAP x3 days ago. EMS reports diminished lower lobe lung sounds. Patient took 4 steps became diaphoretic, anxious, and her sates dropped to the 80s. EMS reports absent lower lobe lung sounds at this time. They placed patient on a CPAP with a neb tx. EMS also gave 2g of Mg and 125 of Solu-medrol. BP 168/100. Patient is in a fib with frequent PVCs. Pacemaker noted.

## 2015-12-03 NOTE — ED Provider Notes (Signed)
[facility] Emergency Department Provider Note  ____________________________________________    I have reviewed the triage vital signs and the nursing notes.   HISTORY  Chief Complaint Respiratory Distress  History limited due to distress, history is per EMS  HPI Kimberly Peterson is a 82 y.o. female who presents with respiratory distress. EMS called to patient's home for respiratory distress, patient poorly has a history of COPD CHF diabetes and hypertension. EMS reports she stopped using her CPAP several days ago. EMS reportedly gave magnesium and solu Medrol and nebulizers.     Past Medical History  Diagnosis Date  . Hypertension   . Diabetes mellitus without complication [facility])     Patient Active Problem List   Diagnosis Date Noted  . COPD exacerbation ([place]) 09/20/2015  . Acute on chronic respiratory failure with hypoxia ([place]) 09/19/2015  . Diastolic CHF, acute on chronic (HCC) 09/19/2015  . Benign essential HTN 09/19/2015  . Controlled diabetes mellitus without complication, with long-term current use of insulin ([place]) 09/19/2015  . Pulmonary embolism ([place]) 09/23/2013    Past Surgical History  Procedure Laterality Date  . Valve replacement    . Pacemaker placement    . Cataract extraction, bilateral      Current Outpatient Rx  Name  Route  Sig  Dispense  Refill  . aspirin 81 MG tablet   Oral   Take 81 mg by mouth daily.         Marland Kitchen bismuth subsalicylate (PEPTO BISMOL) 262 MG/15ML suspension   Oral   Take 30 mLs by mouth every 6 (six) hours as needed for indigestion.         . carvedilol (COREG) 25 MG tablet   Oral   Take 25 mg by mouth 2 (two) times daily.          Marland Kitchen doxycycline (VIBRAMYCIN) 50 MG capsule   Oral   Take 1 capsule (50 mg total) by mouth 2 (two) times daily.   10 capsule   0   . furosemide (LASIX) 40 MG tablet   Oral   Take 0.5 tablets (20 mg total) by mouth every morning. Patient taking differently: Take 40  mg by mouth every morning.    30 tablet   0   . hydrALAZINE (APRESOLINE) 25 MG tablet   Oral   Take 25 mg by mouth 3 (three) times daily.         . insulin aspart protamine- aspart (NOVOLOG MIX 70/30) (70-30) 100 UNIT/ML injection   Subcutaneous   Inject 26-34 Units into the skin 2 (two) times daily with a meal. 34 units in morning and 26 units in the evening         . pantoprazole (PROTONIX) 40 MG tablet   Oral   Take 1 tablet (40 mg total) by mouth daily.   10 tablet   0   . predniSONE (DELTASONE) 10 MG tablet      Please take 3 tablets daily for 1 day, then 2 tablets daily for 2 days, then 1 tablet daily for 2 days. Total 9 Tablets   9 tablet   0   . warfarin (COUMADIN) 5 MG tablet   Oral   Take 1 tablet (5 mg total) by mouth daily.   30 tablet   0     Allergies Review of patient's allergies indicates no known allergies.  No family history on file.  Social History Social History  Substance Use Topics  . Smoking status: Never Smoker   .  Smokeless tobacco: Never Used  . Alcohol Use: No    Level V caveat: Unable to obtain Review of Systems due to patient distress      ____________________________________________   PHYSICAL EXAM:  VITAL SIGNS: ED Triage Vitals  Enc Vitals Group     BP 12/03/15 2145 177/111 mmHg     Pulse Rate 12/03/15 2145 79     Resp 12/03/15 2145 33     Temp 12/03/15 2145 97.5 F (36.4 C)     Temp Source 12/03/15 2145 Axillary     SpO2 12/03/15 2145 97 %     Weight 12/03/15 2145 169 lb 8.5 oz (76.9 kg)     Height 12/03/15 2145 5' (1.524 m)     Head Cir --      Peak Flow --      Pain Score --      Pain Loc --      Pain Edu? --      Excl. in [place]? --     Constitutional: Anxious, alert, ill-appearing, CPAP in place Eyes: Conjunctivae are normal. No erythema or injection ENT   Head: Normocephalic and atraumatic.   Mouth/Throat: Mucous membranes are moist. Cardiovascular: Normal rate, regular rhythm. Normal and  symmetric distal pulses are present in the upper extremities. Respiratory: Increased respiratory effort with tachypnea and retractions. Rales bibasilarly Gastrointestinal: Soft and non-tender in all quadrants. No distention. There is no CVA tenderness. Genitourinary: deferred Musculoskeletal: Nontender with normal range of motion in all extremities. No lower extremity tenderness nor edema. Neurologic:  Normal speech and language. No gross focal neurologic deficits are appreciated. Skin:  Skin is warm, dry and intact. No rash noted. Psychiatric: Patient is anxious  ____________________________________________    LABS (pertinent positives/negatives)  Labs Reviewed  CBC - Abnormal; Notable for the following:    WBC 12.2 (*)    RDW 15.5 (*)    All other components within normal limits  CULTURE, BLOOD (ROUTINE X 2)  CULTURE, BLOOD (ROUTINE X 2)  COMPREHENSIVE METABOLIC PANEL  TROPONIN I  BRAIN NATRIURETIC PEPTIDE    ____________________________________________   EKG  ED ECG REPORT I, Lavonia Drafts, the attending physician, personally viewed and interpreted this ECG.   Date: 12/03/2015  EKG Time: 9:45 PM  Rate: 75  Rhythm: Atrial ventricular dual paced rhythm   Intervals:left bundle branch block  ST&T Change: Nonspecific   ____________________________________________    RADIOLOGY  Positive interstitial edema  ____________________________________________   PROCEDURES  Procedure(s) performed: none  Critical Care performed: yes  CRITICAL CARE Performed by: Lavonia Drafts   Total critical care time: 30 minutes  Critical care time was exclusive of separately billable procedures and treating other patients.  Critical care was necessary to treat or prevent imminent or life-threatening deterioration.  Critical care was time spent personally by me on the following activities: development of treatment plan with patient and/or surrogate as well as nursing,  discussions with consultants, evaluation of patient's response to treatment, examination of patient, obtaining history from patient or surrogate, ordering and performing treatments and interventions, ordering and review of laboratory studies, ordering and review of radiographic studies, pulse oximetry and re-evaluation of patient's condition.   ____________________________________________   INITIAL IMPRESSION / ASSESSMENT AND PLAN / ED COURSE  Pertinent labs & imaging results that were available during my care of the patient were reviewed by me and considered in my medical decision making (see chart for details).  Patient presents in respiratory distress. She is on CPAP per EMS, this was  changed to BiPAP upon arrival to the emergency department. EMS treated a COPD exacerbation however I suspect CHF is actually the cause of her short of breath. We will give Lasix 80 mg IV and apply Nitropaste  ____________________________________________   FINAL CLINICAL IMPRESSION(S) / ED DIAGNOSES  Final diagnoses:  Acute on chronic respiratory failure, unspecified whether with hypoxia or hypercapnia (HCC)  Acute on chronic congestive heart failure, unspecified congestive heart failure type (HCC)          Lavonia Drafts, MD 12/03/15 2311

## 2015-12-04 ENCOUNTER — Encounter: Payer: Self-pay | Admitting: *Deleted

## 2015-12-04 DIAGNOSIS — J9620 Acute and chronic respiratory failure, unspecified whether with hypoxia or hypercapnia: Secondary | ICD-10-CM | POA: Diagnosis not present

## 2015-12-04 DIAGNOSIS — I10 Essential (primary) hypertension: Secondary | ICD-10-CM

## 2015-12-04 DIAGNOSIS — J449 Chronic obstructive pulmonary disease, unspecified: Secondary | ICD-10-CM | POA: Diagnosis not present

## 2015-12-04 DIAGNOSIS — E139 Other specified diabetes mellitus without complications: Secondary | ICD-10-CM | POA: Diagnosis not present

## 2015-12-04 DIAGNOSIS — Z7952 Long term (current) use of systemic steroids: Secondary | ICD-10-CM | POA: Diagnosis not present

## 2015-12-04 DIAGNOSIS — I509 Heart failure, unspecified: Secondary | ICD-10-CM

## 2015-12-04 DIAGNOSIS — Z7982 Long term (current) use of aspirin: Secondary | ICD-10-CM | POA: Diagnosis not present

## 2015-12-04 DIAGNOSIS — Z833 Family history of diabetes mellitus: Secondary | ICD-10-CM | POA: Diagnosis not present

## 2015-12-04 DIAGNOSIS — Z8249 Family history of ischemic heart disease and other diseases of the circulatory system: Secondary | ICD-10-CM | POA: Diagnosis not present

## 2015-12-04 DIAGNOSIS — E119 Type 2 diabetes mellitus without complications: Secondary | ICD-10-CM

## 2015-12-04 DIAGNOSIS — E662 Morbid (severe) obesity with alveolar hypoventilation: Secondary | ICD-10-CM | POA: Diagnosis present

## 2015-12-04 DIAGNOSIS — J441 Chronic obstructive pulmonary disease with (acute) exacerbation: Secondary | ICD-10-CM | POA: Diagnosis present

## 2015-12-04 DIAGNOSIS — Z952 Presence of prosthetic heart valve: Secondary | ICD-10-CM | POA: Diagnosis not present

## 2015-12-04 DIAGNOSIS — Z86711 Personal history of pulmonary embolism: Secondary | ICD-10-CM | POA: Diagnosis not present

## 2015-12-04 DIAGNOSIS — J811 Chronic pulmonary edema: Secondary | ICD-10-CM | POA: Diagnosis present

## 2015-12-04 DIAGNOSIS — Z794 Long term (current) use of insulin: Secondary | ICD-10-CM

## 2015-12-04 DIAGNOSIS — Z7901 Long term (current) use of anticoagulants: Secondary | ICD-10-CM | POA: Diagnosis not present

## 2015-12-04 DIAGNOSIS — G4733 Obstructive sleep apnea (adult) (pediatric): Secondary | ICD-10-CM | POA: Diagnosis not present

## 2015-12-04 DIAGNOSIS — J9621 Acute and chronic respiratory failure with hypoxia: Secondary | ICD-10-CM

## 2015-12-04 DIAGNOSIS — Z950 Presence of cardiac pacemaker: Secondary | ICD-10-CM | POA: Diagnosis not present

## 2015-12-04 DIAGNOSIS — R748 Abnormal levels of other serum enzymes: Secondary | ICD-10-CM | POA: Diagnosis present

## 2015-12-04 DIAGNOSIS — Z6841 Body Mass Index (BMI) 40.0 and over, adult: Secondary | ICD-10-CM | POA: Diagnosis not present

## 2015-12-04 DIAGNOSIS — I110 Hypertensive heart disease with heart failure: Secondary | ICD-10-CM | POA: Diagnosis present

## 2015-12-04 DIAGNOSIS — I5023 Acute on chronic systolic (congestive) heart failure: Secondary | ICD-10-CM | POA: Diagnosis present

## 2015-12-04 DIAGNOSIS — N179 Acute kidney failure, unspecified: Secondary | ICD-10-CM | POA: Diagnosis present

## 2015-12-04 DIAGNOSIS — Z79899 Other long term (current) drug therapy: Secondary | ICD-10-CM | POA: Diagnosis not present

## 2015-12-04 LAB — GLUCOSE, CAPILLARY
GLUCOSE-CAPILLARY: 247 mg/dL — AB (ref 65–99)
GLUCOSE-CAPILLARY: 294 mg/dL — AB (ref 65–99)
GLUCOSE-CAPILLARY: 355 mg/dL — AB (ref 65–99)
GLUCOSE-CAPILLARY: 129 mg/dL — AB (ref 65–99)
Glucose-Capillary: 257 mg/dL — ABNORMAL HIGH (ref 65–99)

## 2015-12-04 LAB — CBC
HEMATOCRIT: 35.8 % (ref 35.0–47.0)
HEMOGLOBIN: 11.9 g/dL — AB (ref 12.0–16.0)
MCH: 28.4 pg (ref 26.0–34.0)
MCHC: 33.3 g/dL (ref 32.0–36.0)
MCV: 85.2 fL (ref 80.0–100.0)
Platelets: 220 10*3/uL (ref 150–440)
RBC: 4.21 MIL/uL (ref 3.80–5.20)
RDW: 15 % — ABNORMAL HIGH (ref 11.5–14.5)
WBC: 9.4 10*3/uL (ref 3.6–11.0)

## 2015-12-04 LAB — CREATININE, SERUM
Creatinine, Ser: 1.14 mg/dL — ABNORMAL HIGH (ref 0.44–1.00)
GFR calc Af Amer: 50 mL/min — ABNORMAL LOW (ref >60)
GFR, EST NON AFRICAN AMERICAN: 43 mL/min — AB (ref >60)

## 2015-12-04 LAB — BRAIN NATRIURETIC PEPTIDE: B NATRIURETIC PEPTIDE 5: 581 pg/mL — AB (ref 0.0–100.0)

## 2015-12-04 LAB — PROTIME-INR
INR: 1.81
PROTHROMBIN TIME: 20.9 s — AB (ref 11.4–15.0)

## 2015-12-04 LAB — MRSA PCR SCREENING: MRSA by PCR: NEGATIVE

## 2015-12-04 MED ORDER — SODIUM CHLORIDE 0.9% FLUSH
3.0000 mL | Freq: Two times a day (BID) | INTRAVENOUS | Status: DC
  Administered 2015-12-04 – 2015-12-05 (×4): 3 mL via INTRAVENOUS

## 2015-12-04 MED ORDER — ASPIRIN 81 MG PO CHEW
81.0000 mg | CHEWABLE_TABLET | Freq: Every day | ORAL | Status: DC
  Administered 2015-12-04 – 2015-12-06 (×3): 81 mg via ORAL
  Filled 2015-12-04 (×2): qty 1

## 2015-12-04 MED ORDER — FUROSEMIDE 10 MG/ML IJ SOLN
40.0000 mg | Freq: Two times a day (BID) | INTRAMUSCULAR | Status: DC
  Administered 2015-12-04 – 2015-12-06 (×5): 40 mg via INTRAVENOUS
  Filled 2015-12-04 (×3): qty 4

## 2015-12-04 MED ORDER — ACETAMINOPHEN 650 MG RE SUPP: 650.0000 mg | Freq: Four times a day (QID) | RECTAL | Status: DC | PRN

## 2015-12-04 MED ORDER — NITROGLYCERIN 2 % TD OINT
0.5000 [in_us] | TOPICAL_OINTMENT | Freq: Four times a day (QID) | TRANSDERMAL | Status: DC
  Administered 2015-12-04 – 2015-12-06 (×9): 0.5 [in_us] via TOPICAL
  Filled 2015-12-04 (×2): qty 0.5
  Filled 2015-12-04: qty 1
  Filled 2015-12-04 (×4): qty 0.5
  Filled 2015-12-04: qty 1
  Filled 2015-12-04: qty 0.5

## 2015-12-04 MED ORDER — CARVEDILOL 12.5 MG PO TABS
25.0000 mg | ORAL_TABLET | Freq: Two times a day (BID) | ORAL | Status: DC
  Administered 2015-12-04 – 2015-12-06 (×5): 25 mg via ORAL
  Filled 2015-12-04: qty 1
  Filled 2015-12-04 (×2): qty 2

## 2015-12-04 MED ORDER — INSULIN ASPART 100 UNIT/ML ~~LOC~~ SOLN: 0.0000 [IU] | Freq: Every day | SUBCUTANEOUS | Status: DC

## 2015-12-04 MED ORDER — INSULIN ASPART PROT & ASPART (70-30 MIX) 100 UNIT/ML ~~LOC~~ SUSP
34.0000 [IU] | Freq: Every day | SUBCUTANEOUS | Status: DC
  Administered 2015-12-04 – 2015-12-06 (×3): 34 [IU] via SUBCUTANEOUS
  Filled 2015-12-04: qty 0.34
  Filled 2015-12-04 (×2): qty 34

## 2015-12-04 MED ORDER — CHLORHEXIDINE GLUCONATE 0.12 % MT SOLN
15.0000 mL | Freq: Two times a day (BID) | OROMUCOSAL | Status: DC
  Administered 2015-12-04 – 2015-12-06 (×5): 15 mL via OROMUCOSAL
  Filled 2015-12-04 (×3): qty 15

## 2015-12-04 MED ORDER — ENOXAPARIN SODIUM 40 MG/0.4ML ~~LOC~~ SOLN
40.0000 mg | Freq: Two times a day (BID) | SUBCUTANEOUS | Status: DC
  Administered 2015-12-04 – 2015-12-05 (×3): 40 mg via SUBCUTANEOUS
  Filled 2015-12-04: qty 0.4

## 2015-12-04 MED ORDER — WARFARIN SODIUM 2.5 MG PO TABS
5.0000 mg | ORAL_TABLET | Freq: Every day | ORAL | Status: DC
  Administered 2015-12-04 – 2015-12-06 (×3): 5 mg via ORAL
  Filled 2015-12-04: qty 5
  Filled 2015-12-04: qty 2

## 2015-12-04 MED ORDER — IPRATROPIUM-ALBUTEROL 0.5-2.5 (3) MG/3ML IN SOLN
3.0000 mL | Freq: Four times a day (QID) | RESPIRATORY_TRACT | Status: DC
  Administered 2015-12-04 – 2015-12-05 (×4): 3 mL via RESPIRATORY_TRACT
  Filled 2015-12-04 (×3): qty 3

## 2015-12-04 MED ORDER — INSULIN ASPART PROT & ASPART (70-30 MIX) 100 UNIT/ML ~~LOC~~ SUSP: 26.0000 [IU] | Freq: Two times a day (BID) | SUBCUTANEOUS | Status: DC

## 2015-12-04 MED ORDER — HYDRALAZINE HCL 25 MG PO TABS
25.0000 mg | ORAL_TABLET | Freq: Three times a day (TID) | ORAL | Status: DC
  Administered 2015-12-04 – 2015-12-06 (×7): 25 mg via ORAL
  Filled 2015-12-04 (×5): qty 1

## 2015-12-04 MED ORDER — ALUM & MAG HYDROXIDE-SIMETH 200-200-20 MG/5ML PO SUSP: 30.0000 mL | Freq: Four times a day (QID) | ORAL | Status: DC | PRN

## 2015-12-04 MED ORDER — PANTOPRAZOLE SODIUM 40 MG PO TBEC
40.0000 mg | DELAYED_RELEASE_TABLET | Freq: Every day | ORAL | Status: DC
  Administered 2015-12-04 – 2015-12-05 (×2): 40 mg via ORAL
  Filled 2015-12-04: qty 1

## 2015-12-04 MED ORDER — CETYLPYRIDINIUM CHLORIDE 0.05 % MT LIQD
7.0000 mL | Freq: Two times a day (BID) | OROMUCOSAL | Status: DC
  Administered 2015-12-05 (×2): 7 mL via OROMUCOSAL

## 2015-12-04 MED ORDER — SENNOSIDES-DOCUSATE SODIUM 8.6-50 MG PO TABS: 1.0000 | ORAL_TABLET | Freq: Every evening | ORAL | Status: DC | PRN

## 2015-12-04 MED ORDER — INSULIN ASPART 100 UNIT/ML ~~LOC~~ SOLN
0.0000 [IU] | Freq: Three times a day (TID) | SUBCUTANEOUS | Status: DC
  Administered 2015-12-04: 8 [IU] via SUBCUTANEOUS
  Administered 2015-12-04: 15 [IU] via SUBCUTANEOUS
  Administered 2015-12-05 (×2): 3 [IU] via SUBCUTANEOUS
  Administered 2015-12-05: 12:00:00 5 [IU] via SUBCUTANEOUS
  Filled 2015-12-04: qty 3
  Filled 2015-12-04: qty 8
  Filled 2015-12-04: qty 5
  Filled 2015-12-04: qty 15

## 2015-12-04 MED ORDER — ACETAMINOPHEN 325 MG PO TABS: 650.0000 mg | ORAL_TABLET | Freq: Four times a day (QID) | ORAL | Status: DC | PRN

## 2015-12-04 MED ORDER — TIOTROPIUM BROMIDE MONOHYDRATE 18 MCG IN CAPS
18.0000 ug | ORAL_CAPSULE | Freq: Every day | RESPIRATORY_TRACT | Status: DC
  Administered 2015-12-04 – 2015-12-06 (×3): 18 ug via RESPIRATORY_TRACT
  Filled 2015-12-04: qty 5

## 2015-12-04 MED ORDER — INSULIN ASPART PROT & ASPART (70-30 MIX) 100 UNIT/ML ~~LOC~~ SUSP
26.0000 [IU] | Freq: Every day | SUBCUTANEOUS | Status: DC
  Administered 2015-12-04 – 2015-12-05 (×2): 26 [IU] via SUBCUTANEOUS
  Filled 2015-12-04 (×2): qty 26

## 2015-12-04 MED ORDER — BISMUTH SUBSALICYLATE 262 MG/15ML PO SUSP: 30.0000 mL | Freq: Four times a day (QID) | ORAL | Status: DC | PRN

## 2015-12-04 NOTE — Progress Notes (Signed)
Pt has remained alert and oriented x 4 this shift. No complaints of pain on my shift. Pt waiting on Dr.Clapacs to clear for discharge. Pt is interacting appropriately with staff. Further assessment found in CHL. Report given to [place], Therapist, sports.

## 2015-12-04 NOTE — Progress Notes (Signed)
Placed  on 2L n/c, SATs 99%.

## 2015-12-04 NOTE — Consult Note (Signed)
PULMONARY / CRITICAL CARE MEDICINE   Name: Kimberly Peterson MRN: [id number] DOB: 24-Jan-1930    ADMISSION DATE:  12/03/2015 CONSULTATION DATE: 12/04/15  REFERRING MD: Jasmine Pang   CHIEF COMPLAINT:  Shortness of breath  HISTORY OF PRESENT ILLNESS:   Kimberly, Peterson is a 82 year old female With past medical history significant for hypertension, diabetes, sleep apnea, COPD, uses oxygen, CPAP at home. Patient [facility] with acute shortness of breath requiring BiPAP. Patient was placed on BiPAP and was transferred to ICU. [facility] M team was consulted. Patient was transitioned to nasal cannula, and has been maintaining her O2 sats well. Will transfer her to the floor with BiPAP at night.  PAST MEDICAL HISTORY :  She  has a past medical history of Hypertension and Diabetes mellitus without complication ([place]).  PAST SURGICAL HISTORY: She  has past surgical history that includes Valve replacement; pacemaker placement; and Cataract extraction, bilateral.  No Known Allergies  No current facility-administered medications on file prior to encounter.   Current Outpatient Prescriptions on File Prior to Encounter  Medication Sig  . aspirin 81 MG tablet Take 81 mg by mouth daily.  Marland Kitchen bismuth subsalicylate (PEPTO BISMOL) 262 MG/15ML suspension Take 30 mLs by mouth every 6 (six) hours as needed for indigestion.  . carvedilol (COREG) 25 MG tablet Take 25 mg by mouth 2 (two) times daily.   Marland Kitchen doxycycline (VIBRAMYCIN) 50 MG capsule Take 1 capsule (50 mg total) by mouth 2 (two) times daily.  . furosemide (LASIX) 40 MG tablet Take 0.5 tablets (20 mg total) by mouth every morning. (Patient taking differently: Take 40 mg by mouth every morning. )  . hydrALAZINE (APRESOLINE) 25 MG tablet Take 25 mg by mouth 3 (three) times daily.  . insulin aspart protamine- aspart (NOVOLOG MIX 70/30) (70-30) 100 UNIT/ML injection Inject 26-34 Units into the skin 2 (two) times daily with a meal.  34 units in morning and 26 units in the evening  . pantoprazole (PROTONIX) 40 MG tablet Take 1 tablet (40 mg total) by mouth daily.  . predniSONE (DELTASONE) 10 MG tablet Please take 3 tablets daily for 1 day, then 2 tablets daily for 2 days, then 1 tablet daily for 2 days. Total 9 Tablets  . warfarin (COUMADIN) 5 MG tablet Take 1 tablet (5 mg total) by mouth daily.    FAMILY HISTORY:  Her has no family status information on file.   SOCIAL HISTORY: She  reports that she has never smoked. She has never used smokeless tobacco. She reports that she does not drink alcohol or use illicit drugs.  REVIEW OF SYSTEMS:   Review of Systems  Constitutional: Negative for fever, chills and malaise/fatigue.  HENT: Negative for ear discharge and nosebleeds.   Respiratory: Positive for shortness of breath. Negative for cough and hemoptysis.   Gastrointestinal: Negative for nausea and vomiting.  Genitourinary: Negative for urgency and frequency.  Musculoskeletal: Negative for back pain.  Skin: Negative for itching and rash.  Neurological: Negative for tingling, tremors and weakness.     SUBJECTIVE:  Patient states that she has been feeling much better.  VITAL SIGNS: BP 117/53 mmHg  Pulse 70  Temp(Src) 97.7 F (36.5 C) (Axillary)  Resp 16  Ht 5\' 2"  (1.575 m)  Wt 103.2 kg (227 lb 8.2 oz)  BMI 41.60 kg/m2  SpO2 100%  HEMODYNAMICS:    VENTILATOR SETTINGS: Vent Mode:  [-]  FiO2 (%):  [40 %] 40 %  INTAKE /  OUTPUT: I/O last 3 completed shifts: In: -  Out: 300 [Urine:300]  PHYSICAL EXAMINATION: General: Elderly African-American female, appears to be in no acute distress Neuro: Awake, alert, oriented, follows commands, no focal deficits HEENT: Atraumatic, normocephalic, no JVD appreciated, no discharge noted Cardiovascular: Paced rhythm, regular, no murmur rub or gallop noted Lungs: Diminished towards bases, no wheezes crackles or rhonchi noted Abdomen: Obese, round, good bowel  sounds Musculoskeletal: No inflammation, deformity noted Skin: Grossly intact LABS:  BMET  Recent Labs Lab 12/03/15 2144 12/04/15 0552  NA 139  --   K 3.8  --   CL 102  --   CO2 26  --   BUN 20  --   CREATININE 1.05* 1.14*  GLUCOSE 215*  --     Electrolytes  Recent Labs Lab 12/03/15 2144  CALCIUM 8.9    CBC  Recent Labs Lab 12/03/15 2144 12/04/15 0552  WBC 12.2* 9.4  HGB 12.1 11.9*  HCT 37.2 35.8  PLT 255 220    Coag's  Recent Labs Lab 12/04/15 0552  INR 1.81    Sepsis Markers No results for input(s): LATICACIDVEN, PROCALCITON, O2SATVEN in the last 168 hours.  ABG  Recent Labs Lab 12/03/15 2305  PHART 7.38  PCO2ART 55*  PO2ART 117*    Liver Enzymes  Recent Labs Lab 12/03/15 2144  AST 61*  ALT 34  ALKPHOS 99  BILITOT 0.7  ALBUMIN 3.8    Cardiac Enzymes  Recent Labs Lab 12/03/15 2144  TROPONINI 0.04*    Glucose  Recent Labs Lab 12/04/15 0402 12/04/15 0747  GLUCAP 247* 294*    Imaging Dg Chest Portable 1 View  12/03/2015  CLINICAL DATA:  Acute onset of respiratory distress. Initial encounter. EXAM: PORTABLE CHEST 1 VIEW COMPARISON:  Chest radiograph performed 09/19/2015 FINDINGS: The lungs are well-aerated. Vascular congestion is noted. Mild bibasilar opacities may reflect mild interstitial edema. There is no evidence of pleural effusion or pneumothorax. The cardiomediastinal silhouette is enlarged. The patient is status post median sternotomy. A pacemaker is noted overlying the left chest wall, with leads ending overlying the right atrium and right ventricle. No acute osseous abnormalities are seen. IMPRESSION: Vascular congestion and cardiomegaly. Mild bibasilar airspace opacities may reflect mild interstitial edema. Electronically Signed   By: Garald Balding M.D.   On: 12/03/2015 22:11     STUDIES:  none  CULTURES: 5/29Blood cultures>>   ANTIBIOTICS: None  SIGNIFICANT EVENTS: 5/29> admitted with COPD  exacerbation.  LINES/TUBES: None  DISCUSSION: 82 Year old female with Hypertension, diabetes,sleep apnea, COPD , presents with COPD exacerbation requiring BiPAP. Now off of BiPAP, on 2 L O2, Stable to be transferred to the floor  ASSESSMENT / PLAN:  PULMONARY A: Acute on chronic respiratory failure History of PE COPD exacerbation Sleep apnea P:   Support with oxygen to keep sats greater than 88% Continue DuoNeb Spiriva BiPAP at night  Continue warfarin  CARDIOVASCULAR A:  CHF exacerbation, Cardiomegaly Elevated BNP History of hypertension P:  Diurese as per primary Daily weights Strict I's and O's Continue Coreg hydralazine Trend BNP  RENAL A:   Acute kidney injury P:   Trend chemistry   replace electrolytes per ICU protocol   GASTROINTESTINAL A:   No active issues P: Heart healthy diet Protonix for GI prophylaxis   HEMATOLOGICAL A:   No active issues P: Lovenox for DVT prophylaxis Transfuse if Hgb <7 INFECTIOUS A:   No active issues P:   Monitor fever curve, will culture if febrile  ENDOCRINE A:   Diabetes mellitus P:   Blood sugar checks with meals and bedtime   sliding scale insulin coverage  NEUROLOGIC A:   No active issues  P:   RASS goal: 0   Bincy Varughese,AG-ACNP Pulmonary & [place] Pager: [phone]  12/04/2015, 12:05 PM  Pt. Seen and examined with NP, agree with assessment and plan. Acute CHF with pulmonary edema, known history of OSA. Currently on bipap, CTA bilaterally. Will wean down bipap as tolerated, continue diuresis. Continue bipap qhs for OSA.  Marda Stalker, M.D.  12/04/2015  Critical Care Attestation.  I have personally obtained a history, examined the patient, evaluated laboratory and imaging results, formulated the assessment and plan and placed orders. The Patient requires high complexity decision making for assessment and support, frequent evaluation and titration of  therapies, application of advanced monitoring technologies and extensive interpretation of multiple databases. The patient has critical illness that could lead imminently to failure of 1 or more organ systems and requires the highest level of physician preparedness to intervene.  Critical Care Time devoted to patient care services described in this note is 35 minutes and is exclusive of time spent in procedures.

## 2015-12-04 NOTE — ED Provider Notes (Signed)
-----------------------------------------   12:34 AM on 12/04/2015 -----------------------------------------   Blood pressure 116/44, pulse 70, temperature 97.5 F (36.4 C), temperature source Axillary, resp. rate 17, height 5' (1.524 m), weight 169 lb 8.5 oz (76.9 kg), SpO2 98 %.  Assuming care from Dr. Corky Downs.  In short, Kimberly Peterson is a 82 y.o. female with a chief complaint of Respiratory Distress .  Refer to the original H&P for additional details.  The current plan of care is to admit the patient to the hospitalist service to the stepdown unit.  The patient was admitted without any difficulty.   Loney Hering, MD 12/04/15 [phone]

## 2015-12-04 NOTE — Progress Notes (Signed)
Per Dr. Stephania Fragmin, place patient on nasal cannula for SATs >90% off of BiPap.

## 2015-12-04 NOTE — Progress Notes (Signed)
eLink Physician-Brief Progress Note Patient Name: TYQUASHA SUDAR DOB: 1929/08/08 MRN: [id number]   Date of Service  12/04/2015  HPI/Events of Note  Request for Foley catheter placement.  eICU Interventions  Will order Foley catheter placement.      Intervention Category Minor Interventions: Routine modifications to care plan (e.g. PRN medications for pain, fever)  Sommer,Steven Eugene 12/04/2015, 4:21 AM

## 2015-12-04 NOTE — H&P (Signed)
PCP:   [name], MD   Chief Complaint:  Shortness of breath  HPI: This 82 year old female who has chronic respiratory failure, sleep apnea, COPD and chronic congestive heart failure. She states 3 days ago she started developing shortness of breath. She denies any coughing or wheezing. She does report increasing lower extremity edema. She states she's been taking her medications as prescribed with her shortness of breath is getting worse. She states she has needs her CPAP in the last 2 nights. She denies any chest pains. She finally came to the ER as her shortness of breath cutting to worsen. In the ER the patient was placed on BiPAP.  Review of Systems:  The patient denies anorexia, fever, weight loss,, vision loss, decreased hearing, hoarseness, chest pain, syncope, dyspnea on exertion, peripheral edema, balance deficits, hemoptysis, abdominal pain, melena, hematochezia, severe indigestion/heartburn, hematuria, incontinence, genital sores, muscle weakness, suspicious skin lesions, transient blindness, difficulty walking, depression, unusual weight change, abnormal bleeding, enlarged lymph nodes, angioedema, and breast masses.  Past Medical History: Past Medical History  Diagnosis Date  . Hypertension   . Diabetes mellitus without complication [facility])    Past Surgical History  Procedure Laterality Date  . Valve replacement    . Pacemaker placement    . Cataract extraction, bilateral      Medications: Prior to Admission medications   Medication Sig Start Date End Date Taking? Authorizing Provider  aspirin 81 MG tablet Take 81 mg by mouth daily.    Historical Provider, MD  bismuth subsalicylate (PEPTO BISMOL) 262 MG/15ML suspension Take 30 mLs by mouth every 6 (six) hours as needed for indigestion.    Historical Provider, MD  carvedilol (COREG) 25 MG tablet Take 25 mg by mouth 2 (two) times daily.     Historical Provider, MD  doxycycline (VIBRAMYCIN) 50 MG capsule Take 1 capsule (50  mg total) by mouth 2 (two) times daily. 09/21/15   Simbiso Ranga, MD  furosemide (LASIX) 40 MG tablet Take 0.5 tablets (20 mg total) by mouth every morning. Patient taking differently: Take 40 mg by mouth every morning.  09/29/13   Maryann Mikhail, DO  hydrALAZINE (APRESOLINE) 25 MG tablet Take 25 mg by mouth 3 (three) times daily.    Historical Provider, MD  insulin aspart protamine- aspart (NOVOLOG MIX 70/30) (70-30) 100 UNIT/ML injection Inject 26-34 Units into the skin 2 (two) times daily with a meal. 34 units in morning and 26 units in the evening    Historical Provider, MD  pantoprazole (PROTONIX) 40 MG tablet Take 1 tablet (40 mg total) by mouth daily. 09/21/15   Simbiso Ranga, MD  predniSONE (DELTASONE) 10 MG tablet Please take 3 tablets daily for 1 day, then 2 tablets daily for 2 days, then 1 tablet daily for 2 days. Total 9 Tablets 09/21/15   Simbiso Ranga, MD  warfarin (COUMADIN) 5 MG tablet Take 1 tablet (5 mg total) by mouth daily. 09/29/13   Maryann Mikhail, DO    Allergies:  No Known Allergies  Social History:  reports that she has never smoked. She has never used smokeless tobacco. She reports that she does not drink alcohol or use illicit drugs.  Family History: Diabetes mellitus, hypertension  Physical Exam: Filed Vitals:   12/03/15 2330 12/04/15 0000 12/04/15 0030 12/04/15 0100  BP: 165/59 116/44 126/53 156/59  Pulse: 80 70 70 70  Temp:      TempSrc:      Resp: 24 17 16 19   Height:  Weight:      SpO2: 99% 98% 98% 99%    General:  Alert and oriented times three, well developed and nourished, Short of breath on BiPAP Eyes: PERRLA, pink conjunctiva, no scleral icterus ENT: Moist oral mucosa, neck supple, no thyromegaly Lungs: Positive crackles, no wheeze, no crackles, no use of accessory muscles Cardiovascular: regular rate and rhythm, no regurgitation, no gallops, no murmurs. No carotid bruits, no JVD Abdomen: soft, positive BS, non-tender, non-distended, no  organomegaly, not an acute abdomen GU: not examined Neuro: CN II - XII grossly intact Musculoskeletal: strength 4/5 all extremities, no clubbing, cyanosis or Greater than 3+ bilateral lower extremity pitting edema Skin: no rash, no subcutaneous crepitation, no decubitus Psych: appropriate patient   Labs on Admission:   Recent Labs  12/03/15 2144  NA 139  K 3.8  CL 102  CO2 26  GLUCOSE 215*  BUN 20  CREATININE 1.05*  CALCIUM 8.9    Recent Labs  12/03/15 2144  AST 61*  ALT 34  ALKPHOS 99  BILITOT 0.7  PROT 8.1  ALBUMIN 3.8   No results for input(s): LIPASE, AMYLASE in the last 72 hours.  Recent Labs  12/03/15 2144  WBC 12.2*  HGB 12.1  HCT 37.2  MCV 84.8  PLT 255    Recent Labs  12/03/15 2144  TROPONINI 0.04*   Invalid input(s): POCBNP No results for input(s): DDIMER in the last 72 hours. No results for input(s): HGBA1C in the last 72 hours. No results for input(s): CHOL, HDL, LDLCALC, TRIG, CHOLHDL, LDLDIRECT in the last 72 hours. No results for input(s): TSH, T4TOTAL, T3FREE, THYROIDAB in the last 72 hours.  Invalid input(s): FREET3 No results for input(s): VITAMINB12, FOLATE, FERRITIN, TIBC, IRON, RETICCTPCT in the last 72 hours.  Micro Results: No results found for this or any previous visit (from the past 240 hour(s)).   Radiological Exams on Admission: Dg Chest Portable 1 View  12/03/2015  CLINICAL DATA:  Acute onset of respiratory distress. Initial encounter. EXAM: PORTABLE CHEST 1 VIEW COMPARISON:  Chest radiograph performed 09/19/2015 FINDINGS: The lungs are well-aerated. Vascular congestion is noted. Mild bibasilar opacities may reflect mild interstitial edema. There is no evidence of pleural effusion or pneumothorax. The cardiomediastinal silhouette is enlarged. The patient is status post median sternotomy. A pacemaker is noted overlying the left chest wall, with leads ending overlying the right atrium and right ventricle. No acute osseous  abnormalities are seen. IMPRESSION: Vascular congestion and cardiomegaly. Mild bibasilar airspace opacities may reflect mild interstitial edema. Electronically Signed   By: Garald Balding M.D.   On: 12/03/2015 22:11    Assessment/Plan Present on Admission:  . Acute on chronic respiratory failure with hypoxia (HCC) - admit to stepdown, continue BiPAP -IV Lasix, daily weights, strict I's and O's -Oxygen to keep sats greater 88%  Mildly elevated troponin -Chronic when compared to old labs.    Right sided heart failure -See above   Obstructive sleep apnea on CPAP -Continue CPAP  COPD Chronic respiratory failure -Stable, continuing all medications  History of pulmonary embolism -Stable, continuing home medications  . Benign essential HTN -Stable, resume home medications  Diabetes -ADA diet, sliding-scale insulin   Aiyanah Kalama 12/04/2015, 1:29 AM

## 2015-12-05 LAB — URINALYSIS COMPLETE WITH MICROSCOPIC (ARMC ONLY)
Bacteria, UA: NONE SEEN
Bilirubin Urine: NEGATIVE
Glucose, UA: NEGATIVE mg/dL
Ketones, ur: NEGATIVE mg/dL
Leukocytes, UA: NEGATIVE
Nitrite: NEGATIVE
Protein, ur: NEGATIVE mg/dL
Specific Gravity, Urine: 1.005 (ref 1.005–1.030)
Squamous Epithelial / LPF: NONE SEEN
pH: 8 (ref 5.0–8.0)

## 2015-12-05 LAB — GLUCOSE, CAPILLARY
GLUCOSE-CAPILLARY: 227 mg/dL — AB (ref 65–99)
Glucose-Capillary: 173 mg/dL — ABNORMAL HIGH (ref 65–99)
Glucose-Capillary: 173 mg/dL — ABNORMAL HIGH (ref 65–99)
Glucose-Capillary: 158 mg/dL — ABNORMAL HIGH (ref 65–99)
Glucose-Capillary: 142 mg/dL — ABNORMAL HIGH (ref 65–99)

## 2015-12-05 LAB — PROTIME-INR
INR: 2.1
Prothrombin Time: 23.4 seconds — ABNORMAL HIGH (ref 11.4–15.0)

## 2015-12-05 MED ORDER — IPRATROPIUM-ALBUTEROL 0.5-2.5 (3) MG/3ML IN SOLN: 3.0000 mL | Freq: Four times a day (QID) | RESPIRATORY_TRACT | Status: DC | PRN

## 2015-12-05 NOTE — Care Management Important Message (Signed)
Important Message  Patient Details  Name: Kimberly Peterson MRN: [id number] Date of Birth: [birth year]   Medicare Important Message Given:  Yes    Shelbie Ammons, RN 12/05/2015, 11:20 AM

## 2015-12-05 NOTE — Clinical Documentation Improvement (Signed)
Family Medicine  Can the diagnosis of CHF be further specified by type? Thank you    Type - Systolic, Diastolic, Systolic and Diastolic  Other  Clinically Undetermined   Document any associated diagnoses/conditions   Supporting Information: Noted in ED MD note...BNP 581.0/ 177.0  Treatment 80 mg IV given   Please exercise your independent, professional judgment when responding. A specific answer is not anticipated or expected.   Thank You,  Muir [phone]

## 2015-12-05 NOTE — Care Management (Signed)
Admitted to [facility] with the diagnosis of CHF, Daughter/POA [name] lives with her in the home. [phone]). Last seen Dr. Clayborn Bigness 2 weeks ago. Home Health through [facility] 2 years ago. Home oxygen 2 liters per nasal cannula continous x 2 years through [place]. Uses a rolling walker to aid in ambulation. No skilled facility. No falls. Good appetite. Takes care of all basic activities of daily living herself. Doesn't drive anymore, daughter does errands. Gets prescriptions filled at [facility] on [address]. Daughter will transport. Shelbie Ammons RN MSN CCM Care Management [phone]

## 2015-12-05 NOTE — Progress Notes (Signed)
Inpatient Diabetes Program Recommendations  AACE/ADA: New Consensus Statement on Inpatient Glycemic Control (2015)  Target Ranges:  Prepandial:   less than 140 mg/dL      Peak postprandial:   less than 180 mg/dL (1-2 hours)      Critically ill patients:  140 - 180 mg/dL   Results for Kimberly Peterson, Kimberly Peterson (MRN [id number]) as of 12/05/2015 12:17  Ref. Range 12/05/2015 09:21 12/05/2015 11:50  Glucose-Capillary Latest Ref Range: 65-99 mg/dL 173 (H) 227 (H)     Home DM Meds: 70/30 Insulin- 34 units AM with breakfast/ 26 units PM with dinner  Current Insulin Orders: 70/30 Insulin- 34 units AM/ 26 units PM      Novolog Moderate Correction Scale/ SSI (0-15 units) TID AC + HS     MD- If patient continues to have elevated glucose levels at lunch time, please consider increasing morning dose of 70/30 insulin to 37 units with breakfast (10% increase)      --Will follow patient during hospitalization--  Wyn Quaker RN, MSN, CDE Diabetes Coordinator Inpatient Glycemic Control Team Team Pager: [phone] (8a-5p)

## 2015-12-05 NOTE — Progress Notes (Signed)
Initial Heart Failure Clinic appointment scheduled for December 19, 2015 at 11:30am. Thank you.

## 2015-12-05 NOTE — Progress Notes (Signed)
[facility] [place] NAME: Kymber Smilowitz    MR#:  [id number]  DATE OF BIRTH:  01-07-1930  SUBJECTIVE:seen today.feels good.admitted for SOB ,acute on chronic systolic chf exacerbation,intially required BIPAP and ICU stay.,now moved to regular room,feels much better.no SOB,hematuria noted,will d/c foley.  CHIEF COMPLAINT:   Chief Complaint  Patient presents with  . Respiratory Distress    REVIEW OF SYSTEMS:   ROS CONSTITUTIONAL: No fever, fatigue or weakness.  EYES: No blurred or double vision.  EARS, NOSE, AND THROAT: No tinnitus or ear pain.  RESPIRATORY: No cough, shortness of breath, wheezing or hemoptysis.  CARDIOVASCULAR: No chest pain, orthopnea, edema.  GASTROINTESTINAL: No nausea, vomiting, diarrhea or abdominal pain.  GENITOURINARY: No dysuria.but has hematuria.Marland Kitchen  ENDOCRINE: No polyuria, nocturia,  HEMATOLOGY: No anemia, easy bruising or bleeding SKIN: No rash or lesion. MUSCULOSKELETAL: No joint pain or arthritis.   NEUROLOGIC: No tingling, numbness, weakness.  PSYCHIATRY: No anxiety or depression.   DRUG ALLERGIES:  No Known Allergies  VITALS:  Blood pressure 162/66, pulse 70, temperature 98.6 F (37 C), temperature source Oral, resp. rate 20, height 5\' 2"  (1.575 m), weight 104.282 kg (229 lb 14.4 oz), SpO2 100 %.  PHYSICAL EXAMINATION:  GENERAL:  82 y.o.-year-old patient lying in the bed with no acute distress.  EYES: Pupils equal, round, reactive to light and accommodation. No scleral icterus. Extraocular muscles intact.  HEENT: Head atraumatic, normocephalic. Oropharynx and nasopharynx clear.  NECK:  Supple, no jugular venous distention. No thyroid enlargement, no tenderness.  LUNGS: Normal breath sounds bilaterally, no wheezing, rales,rhonchi or crepitation. No use of accessory muscles of respiration.  CARDIOVASCULAR: S1, S2 normal. No murmurs, rubs, or gallops.  ABDOMEN: Soft, nontender, nondistended. Bowel  sounds present. No organomegaly or mass.  EXTREMITIES: No pedal edema, cyanosis, or clubbing.  NEUROLOGIC: Cranial nerves II through XII are intact. Muscle strength 5/5 in all extremities. Sensation intact. Gait not checked.  PSYCHIATRIC: The patient is alert and oriented x 3.  SKIN: No obvious rash, lesion, or ulcer.  Bilateral leg edema.  LABORATORY PANEL:   CBC  Recent Labs Lab 12/04/15 0552  WBC 9.4  HGB 11.9*  HCT 35.8  PLT 220   ------------------------------------------------------------------------------------------------------------------  Chemistries   Recent Labs Lab 12/03/15 2144 12/04/15 0552  NA 139  --   K 3.8  --   CL 102  --   CO2 26  --   GLUCOSE 215*  --   BUN 20  --   CREATININE 1.05* 1.14*  CALCIUM 8.9  --   AST 61*  --   ALT 34  --   ALKPHOS 99  --   BILITOT 0.7  --    ------------------------------------------------------------------------------------------------------------------  Cardiac Enzymes  Recent Labs Lab 12/03/15 2144  TROPONINI 0.04*   ------------------------------------------------------------------------------------------------------------------  RADIOLOGY:  Dg Chest Portable 1 View  12/03/2015  CLINICAL DATA:  Acute onset of respiratory distress. Initial encounter. EXAM: PORTABLE CHEST 1 VIEW COMPARISON:  Chest radiograph performed 09/19/2015 FINDINGS: The lungs are well-aerated. Vascular congestion is noted. Mild bibasilar opacities may reflect mild interstitial edema. There is no evidence of pleural effusion or pneumothorax. The cardiomediastinal silhouette is enlarged. The patient is status post median sternotomy. A pacemaker is noted overlying the left chest wall, with leads ending overlying the right atrium and right ventricle. No acute osseous abnormalities are seen. IMPRESSION: Vascular congestion and cardiomegaly. Mild bibasilar airspace opacities may reflect mild interstitial edema. Electronically Signed   By: Jacqulynn Cadet  Chang M.D.   On: 12/03/2015 22:11    EKG:   Orders placed or performed during the hospital encounter of 12/03/15  . ED EKG  . ED EKG  . EKG 12-Lead  . EKG 12-Lead    ASSESSMENT AND PLAN:  1.ACUTE ON CHRONIC SYSTOLIC CHF ;FEELING  Better.continue IV lasix today and change  Po tomorrow, Continue coreg,coumadin. 2.DMII;continue  70/30  As directed in med list. 3.HTN;controled 4.h/o PE;on coumdadin. PT consult likley d/c am D/c foley All the records are reviewed and case discussed with Care Management/Social Workerr. Management plans discussed with the patient, family and they are in agreement.  CODE STATUS:full  TOTAL TIME TAKING CARE OF THIS PATIENT: 61minutes.   POSSIBLE D/C IN 1-2 DAYS, DEPENDING ON CLINICAL CONDITION.   Epifanio Lesches M.D on 12/05/2015 at 5:36 PM  Between 7am to 6pm - Pager - [phone]  After 6pm go to [email or web address] - password [email or web address] [facility]  Office  [phone]  CC: Primary care physician; [name], MD   Note: This dictation was prepared with Dragon dictation along with smaller phrase technology. Any transcriptional errors that result from this process are unintentional.

## 2015-12-06 LAB — PROTIME-INR
INR: 2.26
PROTHROMBIN TIME: 24.7 s — AB (ref 11.4–15.0)

## 2015-12-06 LAB — GLUCOSE, CAPILLARY
GLUCOSE-CAPILLARY: 221 mg/dL — AB (ref 65–99)
Glucose-Capillary: 81 mg/dL (ref 65–99)

## 2015-12-06 MED ORDER — FUROSEMIDE 40 MG PO TABS: 40.0000 mg | ORAL_TABLET | Freq: Every day | ORAL | Status: DC

## 2015-12-06 MED ORDER — TIOTROPIUM BROMIDE MONOHYDRATE 18 MCG IN CAPS: 18.0000 ug | ORAL_CAPSULE | Freq: Every day | RESPIRATORY_TRACT | Status: DC

## 2015-12-06 NOTE — Discharge Instructions (Signed)
Heart Failure Clinic appointment on December 19, 2015 at 11:30am with Darylene Price, [place]. Please call [phone] to reschedule.  cpap at night 02  2 litres [place]

## 2015-12-06 NOTE — Progress Notes (Signed)
Pt for discharge home. Alert. 02 in use [place].  No resp distress. Some sob with increased exertion. Discharge instruction to pt and dtr.   presc meds discussed/ home meds discussed.   Diet/ activity and f/u discussed.  chf  Parameters discussed re weighing self 1st thing in am/ call md for  Increase in sob or swelling of hands  /feet or stomach and inability to  Lie down at night. Pt and dtr verbalize understanding of  Plans for discharge.

## 2015-12-06 NOTE — Progress Notes (Signed)
She feels back to normal. No new complaints  Filed Vitals:   12/05/15 2030 12/05/15 2258 12/06/15 0454 12/06/15 0802  BP: 135/51  163/70 148/71  Pulse: 68  68 69  Temp: 98.3 F (36.8 C)  98.1 F (36.7 C) 98.4 F (36.9 C)  TempSrc: Oral  Axillary Oral  Resp: 20  16 20   Height:      Weight:      SpO2: 100% 100% 100% 100%   Obese, NAD @ rest JVP cannot be assessed due to neck girth No wheezes, minimal bibasilar crackles RRR  Obese Symmetric LE edema  BMP Latest Ref Rng 12/04/2015 12/03/2015 09/21/2015  Glucose 65 - 99 mg/dL - 215(H) 251(H)  BUN 6 - 20 mg/dL - 20 22(H)  Creatinine 0.44 - 1.00 mg/dL 1.14(H) 1.05(H) 1.10(H)  Sodium 135 - 145 mmol/L - 139 140  Potassium 3.5 - 5.1 mmol/L - 3.8 5.1  Chloride 101 - 111 mmol/L - 102 98(L)  CO2 22 - 32 mmol/L - 26 31  Calcium 8.9 - 10.3 mg/dL - 8.9 9.4    CBC Latest Ref Rng 12/04/2015 12/03/2015 09/21/2015  WBC 3.6 - 11.0 K/uL 9.4 12.2(H) 9.1  Hemoglobin 12.0 - 16.0 g/dL 11.9(L) 12.1 12.7  Hematocrit 35.0 - 47.0 % 35.8 37.2 40.0  Platelets 150 - 440 K/uL 220 255 249    No new CXR  IMPRESSION: Acute on chronic hypoxemic respiratory failure Obesity hypoventilation syndrome - baseline PaCO2 55-60 mmHg Resolving pulmonary edema Impaired LVEF by recent TTE  PLAN/REC: Continue supplemental O2 @ her baseline 3 lpm [place] Continue nocturnal BiPAP [facility] Rx of CHF - consider Cards consult or follow up after discharge for new finding of impaired LVEF  Has seen [name] in past  PCCM will sign off. Please call if we can be of further assistance  Merton Border, MD PCCM service Mobile [phone] Pager [phone] 12/06/2015

## 2015-12-07 NOTE — Discharge Summary (Signed)
Kimberly Peterson, is a 82 y.o. female  DOB 1929-09-16  MRN [id number].  Admission date:  12/03/2015  Admitting Physician  Quintella Baton, MD  Discharge Date:  12/07/2015   Primary MD  [name], MD  Recommendations for primary care physician for things to follow:   Follow-up with primary doctor in 1 week Follow up with  DR.[name] is an outpatient.  Admission Diagnosis  Acute on chronic respiratory failure, unspecified whether with hypoxia or hypercapnia (HCC) [J96.20] Acute on chronic congestive heart failure, unspecified congestive heart failure type ([place]) [I50.9]   Discharge Diagnosis  Acute on chronic respiratory failure, unspecified whether with hypoxia or hypercapnia (HCC) [J96.20] Acute on chronic congestive heart failure, unspecified congestive heart failure type ([place]) [I50.9]   Principal Problem:   CHF (congestive heart failure) (HCC) Active Problems:   Acute on chronic respiratory failure with hypoxia (HCC)   Benign essential HTN   Controlled diabetes mellitus without complication, with long-term current use of insulin (HCC)   Diabetes mellitus ([place])      Past Medical History  Diagnosis Date  . Hypertension   . Diabetes mellitus without complication [facility])     Past Surgical History  Procedure Laterality Date  . Valve replacement    . Pacemaker placement    . Cataract extraction, bilateral         History of present illness and  Hospital Course:     Kindly see H&P for history of present illness and admission details, please review complete Labs, Consult reports and Test reports for all details in brief  HPI  from the history and physical done on the day of admission  82 year old female patient came in because of shortness of breath for 3 days associated with orthopnea, PND. Pedal edema. Chest  x-ray on admission showed pulmonary edema. Admitted to ICU for acute on chronic respiratory failure secondary to CHF evaluation, initially required BiPAP. Also placed on Lasix IV.   Hospital Course  Acute on chronic respiratory failure secondary to CHF exacerbation: Initially required BiPAP, ICU stay. Patient improved with IV Lasix and BiPAP. Her out of ICU to regular room. Patient breathing improved with IV Lasix.  had echo done in March this year showing EF of 40%. Does have prostatic aortic valve. And then take the Coreg, Lasix. Advised to take Lasix 40 mg daily for 3 days and then resume 20 mg daily after that. Advised to continue salt restriction and also fluid restriction.  Ca have ACE inhibitor  As an outpatient and follow-up with CHF clinic and they can start  the ACE inhibitor also.  #2 history of prostatic aortic valve./History of present:: Patient is already on Coumadin. INR is therapeutic. And continue Coumadin. #3.copd;, chronic respiratory failure patient is on 2 L of oxygen all the time. #4 history of sleep apnea, CPAP at night.  For diabetes mellitus type 2: Patient is  Novolin 70/30 insulin 34 units in the morning and 26 units at night  Discharge Condition: stable   Follow UP  Follow-up Information    Follow up with Alisa Graff, FNP. Go on 12/19/2015.   Specialty:  Family Medicine   Why:  at 11:30am , to the Heart Failure Clinic   Contact information:   [place] [address] [id number] [phone]       Follow up with [name], MD In 1 week.   Specialty:  Internal Medicine   Contact information:   [place]  [place] [address] [phone]         Discharge Instructions  and  Discharge Medications     Discharge Instructions    AMB referral to CHF clinic    Complete by:  As directed             Medication List    TAKE these medications        aspirin 81 MG tablet  Take 81 mg by mouth daily.     carvedilol 25  MG tablet  Commonly known as:  COREG  Take 25 mg by mouth 2 (two) times daily.     furosemide 40 MG tablet  Commonly known as:  LASIX  Take 1 tablet (40 mg total) by mouth daily.     hydrALAZINE 25 MG tablet  Commonly known as:  APRESOLINE  Take 25 mg by mouth 3 (three) times daily.     insulin aspart protamine- aspart (70-30) 100 UNIT/ML injection  Commonly known as:  NOVOLOG MIX 70/30  Inject 26-34 Units into the skin 2 (two) times daily with a meal. 34 units in morning and 26 units in the evening     pantoprazole 40 MG tablet  Commonly known as:  PROTONIX  Take 1 tablet (40 mg total) by mouth daily.     tiotropium 18 MCG inhalation capsule  Commonly known as:  SPIRIVA  Place 1 capsule (18 mcg total) into inhaler and inhale daily.     warfarin 5 MG tablet  Commonly known as:  COUMADIN  Take 1 tablet (5 mg total) by mouth daily.          Diet and Activity recommendation: See Discharge Instructions above   Consults obtained - pulmonary/critical care   Major procedures and Radiology Reports - PLEASE review detailed and final reports for all details, in brief -     Dg Chest Portable 1 View  12/03/2015  CLINICAL DATA:  Acute onset of respiratory distress. Initial encounter. EXAM: PORTABLE CHEST 1 VIEW COMPARISON:  Chest radiograph performed 09/19/2015 FINDINGS: The lungs are well-aerated. Vascular congestion is noted. Mild bibasilar opacities may reflect mild interstitial edema. There is no evidence of pleural effusion or pneumothorax. The cardiomediastinal silhouette is enlarged. The patient is status post median sternotomy. A pacemaker is noted overlying the left chest wall, with leads ending overlying the right atrium and right ventricle. No acute osseous abnormalities are seen. IMPRESSION: Vascular congestion and cardiomegaly. Mild bibasilar airspace opacities may reflect mild interstitial edema. Electronically Signed   By: Garald Balding M.D.   On: 12/03/2015 22:11     Micro Results    Recent Results (from the past 240 hour(s))  Blood culture (routine x 2)     Status: None (Preliminary result)   Collection Time: 12/03/15 10:04 PM  Result Value Ref Range Status   Specimen Description BLOOD LEFT ANTECUBITAL  Final   Special Requests BOTTLES DRAWN AEROBIC AND ANAEROBIC 15ML  Final   Culture NO GROWTH 2 DAYS  Final   Report Status PENDING  Incomplete  Blood culture (routine x 2)     Status: None (Preliminary result)   Collection Time: 12/03/15 10:04 PM  Result Value Ref Range Status   Specimen Description BLOOD LEFT HAND  Final   Special Requests BOTTLES DRAWN AEROBIC AND ANAEROBIC 15ML  Final   Culture NO GROWTH 2 DAYS  Final   Report Status PENDING  Incomplete  MRSA PCR Screening     Status: None   Collection Time: 12/04/15  4:00  AM  Result Value Ref Range Status   MRSA by PCR NEGATIVE NEGATIVE Final    Comment:        The GeneXpert MRSA Assay (FDA approved for NASAL specimens only), is one component of a comprehensive MRSA colonization surveillance program. It is not intended to diagnose MRSA infection nor to guide or monitor treatment for MRSA infections.        Today   Subjective:   Kimberly Peterson today has no headache,no chest abdominal pain,no new weakness tingling or numbness, feels much better wants to go home today.   Objective:   Blood pressure 148/71, pulse 69, temperature 98.4 F (36.9 C), temperature source Oral, resp. rate 20, height 5\' 2"  (1.575 m), weight 104.282 kg (229 lb 14.4 oz), SpO2 100 %.   Intake/Output Summary (Last 24 hours) at 12/07/15 [id number] Last data filed at 12/06/15 1505  Gross per 24 hour  Intake    120 ml  Output      0 ml  Net    120 ml    Exam Awake Alert, Oriented x 3, No new F.N deficits, Normal affect [place].AT,PERRAL Supple Neck,No JVD, No cervical lymphadenopathy appriciated.  Symmetrical Chest wall movement, Good air movement bilaterally, CTAB RRR,No Gallops,Rubs or new Murmurs, No  Parasternal Heave +ve B.Sounds, Abd Soft, Non tender, No organomegaly appriciated, No rebound -guarding or rigidity. No Cyanosis, Clubbing or edema, No new Rash or bruise  Data Review   CBC w Diff: Lab Results  Component Value Date   WBC 9.4 12/04/2015   WBC 6.8 09/10/2013   HGB 11.9* 12/04/2015   HGB 9.9* 09/10/2013   HCT 35.8 12/04/2015   HCT 30.9* 09/10/2013   PLT 220 12/04/2015   PLT 277 09/10/2013   LYMPHOPCT 15 09/19/2015   LYMPHOPCT 17.9 09/10/2013   MONOPCT 7 09/19/2015   MONOPCT 9.7 09/10/2013   EOSPCT 1 09/19/2015   EOSPCT 3.4 09/10/2013   BASOPCT 0 09/19/2015   BASOPCT 0.6 09/10/2013    CMP: Lab Results  Component Value Date   NA 139 12/03/2015   NA 136 09/10/2013   K 3.8 12/03/2015   K 4.3 09/10/2013   CL 102 12/03/2015   CL 99 09/10/2013   CO2 26 12/03/2015   CO2 35* 09/10/2013   BUN 20 12/03/2015   BUN 12 09/10/2013   CREATININE 1.14* 12/04/2015   CREATININE 1.04 09/10/2013   PROT 8.1 12/03/2015   PROT 7.0 09/09/2013   ALBUMIN 3.8 12/03/2015   ALBUMIN 2.6* 09/09/2013   BILITOT 0.7 12/03/2015   BILITOT 0.4 09/09/2013   ALKPHOS 99 12/03/2015   ALKPHOS 61 09/09/2013   AST 61* 12/03/2015   AST 19 09/09/2013   ALT 34 12/03/2015   ALT 12 09/09/2013  .   Total Time in preparing paper work, data evaluation and todays exam - 62 minutes  Claudetta Sallie M.D on 12/07/2015 at 7:12 AM    Note: This dictation was prepared with Dragon dictation along with smaller phrase technology. Any transcriptional errors that result from this process are unintentional.

## 2015-12-08 LAB — CULTURE, BLOOD (ROUTINE X 2)
CULTURE: NO GROWTH
Culture: NO GROWTH

## 2015-12-10 ENCOUNTER — Telehealth: Payer: Self-pay | Admitting: *Deleted

## 2015-12-10 NOTE — Telephone Encounter (Signed)
Spoke with daughter and she states pt is going to her PCP 12/11/15 and if the PCP thinks she needs to come she will call back to schedule appt. Nothing further needed.

## 2015-12-10 NOTE — Telephone Encounter (Signed)
-----   Message from Laverle Hobby, MD sent at 12/05/2015 11:50 AM EDT ----- Regarding: HFU Pt needs fu with me in 2-4 weeks for COPD and OSA.

## 2015-12-11 DIAGNOSIS — I110 Hypertensive heart disease with heart failure: Secondary | ICD-10-CM | POA: Diagnosis not present

## 2015-12-11 DIAGNOSIS — G4733 Obstructive sleep apnea (adult) (pediatric): Secondary | ICD-10-CM | POA: Diagnosis not present

## 2015-12-11 DIAGNOSIS — J9691 Respiratory failure, unspecified with hypoxia: Secondary | ICD-10-CM | POA: Diagnosis not present

## 2015-12-11 DIAGNOSIS — Z7901 Long term (current) use of anticoagulants: Secondary | ICD-10-CM | POA: Diagnosis not present

## 2015-12-11 DIAGNOSIS — I480 Paroxysmal atrial fibrillation: Secondary | ICD-10-CM | POA: Diagnosis not present

## 2015-12-11 DIAGNOSIS — I359 Nonrheumatic aortic valve disorder, unspecified: Secondary | ICD-10-CM | POA: Diagnosis not present

## 2015-12-11 DIAGNOSIS — E1165 Type 2 diabetes mellitus with hyperglycemia: Secondary | ICD-10-CM | POA: Diagnosis not present

## 2015-12-11 DIAGNOSIS — I5033 Acute on chronic diastolic (congestive) heart failure: Secondary | ICD-10-CM | POA: Diagnosis not present

## 2015-12-18 DIAGNOSIS — E782 Mixed hyperlipidemia: Secondary | ICD-10-CM | POA: Diagnosis not present

## 2015-12-18 DIAGNOSIS — I10 Essential (primary) hypertension: Secondary | ICD-10-CM | POA: Diagnosis not present

## 2015-12-18 DIAGNOSIS — G4733 Obstructive sleep apnea (adult) (pediatric): Secondary | ICD-10-CM | POA: Diagnosis not present

## 2015-12-18 DIAGNOSIS — Z7901 Long term (current) use of anticoagulants: Secondary | ICD-10-CM | POA: Diagnosis not present

## 2015-12-18 DIAGNOSIS — E1165 Type 2 diabetes mellitus with hyperglycemia: Secondary | ICD-10-CM | POA: Diagnosis not present

## 2015-12-18 DIAGNOSIS — I480 Paroxysmal atrial fibrillation: Secondary | ICD-10-CM | POA: Diagnosis not present

## 2015-12-19 ENCOUNTER — Ambulatory Visit: Payer: Medicare Other | Admitting: Family

## 2016-02-19 DIAGNOSIS — I480 Paroxysmal atrial fibrillation: Secondary | ICD-10-CM | POA: Diagnosis not present

## 2016-02-19 DIAGNOSIS — I10 Essential (primary) hypertension: Secondary | ICD-10-CM | POA: Diagnosis not present

## 2016-02-19 DIAGNOSIS — Z7901 Long term (current) use of anticoagulants: Secondary | ICD-10-CM | POA: Diagnosis not present

## 2016-02-19 DIAGNOSIS — K219 Gastro-esophageal reflux disease without esophagitis: Secondary | ICD-10-CM | POA: Diagnosis not present

## 2016-02-19 DIAGNOSIS — N182 Chronic kidney disease, stage 2 (mild): Secondary | ICD-10-CM | POA: Diagnosis not present

## 2016-02-19 DIAGNOSIS — I5033 Acute on chronic diastolic (congestive) heart failure: Secondary | ICD-10-CM | POA: Diagnosis not present

## 2016-02-19 DIAGNOSIS — E1165 Type 2 diabetes mellitus with hyperglycemia: Secondary | ICD-10-CM | POA: Diagnosis not present

## 2016-02-27 ENCOUNTER — Ambulatory Visit (INDEPENDENT_AMBULATORY_CARE_PROVIDER_SITE_OTHER): Payer: Medicare Other | Admitting: Podiatry

## 2016-02-27 ENCOUNTER — Encounter: Payer: Self-pay | Admitting: Podiatry

## 2016-02-27 DIAGNOSIS — M79676 Pain in unspecified toe(s): Secondary | ICD-10-CM | POA: Diagnosis not present

## 2016-02-27 DIAGNOSIS — E1142 Type 2 diabetes mellitus with diabetic polyneuropathy: Secondary | ICD-10-CM | POA: Diagnosis not present

## 2016-02-27 DIAGNOSIS — Q828 Other specified congenital malformations of skin: Secondary | ICD-10-CM

## 2016-02-27 DIAGNOSIS — B351 Tinea unguium: Secondary | ICD-10-CM

## 2016-02-27 NOTE — Progress Notes (Signed)
She presents today chief complaint of painful elongated toenails and calluses to the distal aspect of the toes.  Objective: Vital signs are stable she is alert and oriented 3. Pulses are palpable. Toenails are grossly elongated sharply incurvated painful on palpation as well as debridement. Distal reactive Clay viral so noted. No open lesions or wounds are noted.  Assessment: Pain and limb secondary to onychomycosis diabetes and distal clavi. Debrided all reactive hyperkeratosis and debrided toenails 1 through 5 bilateral.

## 2016-03-11 ENCOUNTER — Other Ambulatory Visit: Payer: Self-pay

## 2016-04-22 DIAGNOSIS — E1165 Type 2 diabetes mellitus with hyperglycemia: Secondary | ICD-10-CM | POA: Diagnosis not present

## 2016-04-22 DIAGNOSIS — I110 Hypertensive heart disease with heart failure: Secondary | ICD-10-CM | POA: Diagnosis not present

## 2016-04-22 DIAGNOSIS — I480 Paroxysmal atrial fibrillation: Secondary | ICD-10-CM | POA: Diagnosis not present

## 2016-04-22 DIAGNOSIS — N182 Chronic kidney disease, stage 2 (mild): Secondary | ICD-10-CM | POA: Diagnosis not present

## 2016-04-22 DIAGNOSIS — Z7901 Long term (current) use of anticoagulants: Secondary | ICD-10-CM | POA: Diagnosis not present

## 2016-05-26 ENCOUNTER — Ambulatory Visit: Payer: Medicare Other | Admitting: Podiatry

## 2016-06-09 ENCOUNTER — Ambulatory Visit: Payer: Medicare Other | Admitting: Podiatry

## 2016-06-09 ENCOUNTER — Ambulatory Visit (INDEPENDENT_AMBULATORY_CARE_PROVIDER_SITE_OTHER): Payer: Medicare Other | Admitting: Podiatry

## 2016-06-09 ENCOUNTER — Encounter: Payer: Self-pay | Admitting: Podiatry

## 2016-06-09 VITALS — Ht 62.0 in | Wt 223.0 lb

## 2016-06-09 DIAGNOSIS — L84 Corns and callosities: Secondary | ICD-10-CM | POA: Diagnosis not present

## 2016-06-09 DIAGNOSIS — M79676 Pain in unspecified toe(s): Secondary | ICD-10-CM | POA: Diagnosis not present

## 2016-06-09 DIAGNOSIS — B351 Tinea unguium: Secondary | ICD-10-CM | POA: Diagnosis not present

## 2016-06-09 NOTE — Progress Notes (Signed)
Complaint:  Visit Type: Patient returns to my office for continued preventative foot care services. Complaint: Patient states" my nails have grown long and thick and become painful to walk and wear shoes"  The patient presents for preventative foot care services. No changes to ROS.  Painful corns on toes both feet.  Podiatric Exam: Vascular: dorsalis pedis and posterior tibial pulses are palpable bilateral. Capillary return is immediate. Temperature gradient is WNL. Skin turgor WNL  Sensorium: Normal Semmes Weinstein monofilament test. Normal tactile sensation bilaterally. Nail Exam: Pt has thick disfigured discolored nails with subungual debris noted bilateral entire nail hallux through fifth toenails Ulcer Exam: There is no evidence of ulcer or pre-ulcerative changes or infection. Orthopedic Exam: Muscle tone and strength are WNL. No limitations in general ROM. No crepitus or effusions noted. Foot type and digits show no abnormalities. Bony prominences are unremarkable. Skin: No Porokeratosis. No infection or ulcers.  Distal clavi second toe  B/L  Diagnosis:  Onychomycosis, , Pain in right toe, pain in left toes,  Clavi second B/L  Treatment & Plan Procedures and Treatment: Consent by patient was obtained for treatment procedures. The patient understood the discussion of treatment and procedures well. All questions were answered thoroughly reviewed. Debridement of mycotic and hypertrophic toenails, 1 through 5 bilateral and clearing of subungual debris. No ulceration, no infection noted.  Return Visit-Office Procedure: Patient instructed to return to the office for a follow up visit 3 months for continued evaluation and treatment.    Gardiner Barefoot DPM

## 2016-06-19 DIAGNOSIS — E1165 Type 2 diabetes mellitus with hyperglycemia: Secondary | ICD-10-CM | POA: Diagnosis not present

## 2016-06-19 DIAGNOSIS — G4733 Obstructive sleep apnea (adult) (pediatric): Secondary | ICD-10-CM | POA: Diagnosis not present

## 2016-06-19 DIAGNOSIS — I480 Paroxysmal atrial fibrillation: Secondary | ICD-10-CM | POA: Diagnosis not present

## 2016-06-19 DIAGNOSIS — Z7901 Long term (current) use of anticoagulants: Secondary | ICD-10-CM | POA: Diagnosis not present

## 2016-06-19 DIAGNOSIS — E782 Mixed hyperlipidemia: Secondary | ICD-10-CM | POA: Diagnosis not present

## 2016-06-19 DIAGNOSIS — I359 Nonrheumatic aortic valve disorder, unspecified: Secondary | ICD-10-CM | POA: Diagnosis not present

## 2016-08-19 DIAGNOSIS — I480 Paroxysmal atrial fibrillation: Secondary | ICD-10-CM | POA: Diagnosis not present

## 2016-08-19 DIAGNOSIS — Z7901 Long term (current) use of anticoagulants: Secondary | ICD-10-CM | POA: Diagnosis not present

## 2016-08-19 DIAGNOSIS — R1030 Lower abdominal pain, unspecified: Secondary | ICD-10-CM | POA: Diagnosis not present

## 2016-08-19 DIAGNOSIS — E1165 Type 2 diabetes mellitus with hyperglycemia: Secondary | ICD-10-CM | POA: Diagnosis not present

## 2016-08-19 DIAGNOSIS — N390 Urinary tract infection, site not specified: Secondary | ICD-10-CM | POA: Diagnosis not present

## 2016-08-19 DIAGNOSIS — I10 Essential (primary) hypertension: Secondary | ICD-10-CM | POA: Diagnosis not present

## 2016-09-29 ENCOUNTER — Ambulatory Visit: Payer: Medicare Other | Admitting: Podiatry

## 2016-11-06 DIAGNOSIS — I480 Paroxysmal atrial fibrillation: Secondary | ICD-10-CM | POA: Diagnosis not present

## 2016-11-06 DIAGNOSIS — N182 Chronic kidney disease, stage 2 (mild): Secondary | ICD-10-CM | POA: Diagnosis not present

## 2016-11-06 DIAGNOSIS — R0602 Shortness of breath: Secondary | ICD-10-CM | POA: Diagnosis not present

## 2016-11-06 DIAGNOSIS — G4733 Obstructive sleep apnea (adult) (pediatric): Secondary | ICD-10-CM | POA: Diagnosis not present

## 2016-11-06 DIAGNOSIS — R0902 Hypoxemia: Secondary | ICD-10-CM | POA: Diagnosis not present

## 2017-01-20 DIAGNOSIS — E1165 Type 2 diabetes mellitus with hyperglycemia: Secondary | ICD-10-CM | POA: Diagnosis not present

## 2017-01-20 DIAGNOSIS — I351 Nonrheumatic aortic (valve) insufficiency: Secondary | ICD-10-CM | POA: Diagnosis not present

## 2017-01-20 DIAGNOSIS — Z6839 Body mass index (BMI) 39.0-39.9, adult: Secondary | ICD-10-CM | POA: Diagnosis not present

## 2017-01-20 DIAGNOSIS — Z7901 Long term (current) use of anticoagulants: Secondary | ICD-10-CM | POA: Diagnosis not present

## 2017-01-20 DIAGNOSIS — E782 Mixed hyperlipidemia: Secondary | ICD-10-CM | POA: Diagnosis not present

## 2017-01-20 DIAGNOSIS — I10 Essential (primary) hypertension: Secondary | ICD-10-CM | POA: Diagnosis not present

## 2017-01-20 DIAGNOSIS — R0602 Shortness of breath: Secondary | ICD-10-CM | POA: Diagnosis not present

## 2017-01-20 DIAGNOSIS — R601 Generalized edema: Secondary | ICD-10-CM | POA: Diagnosis not present

## 2017-05-11 DIAGNOSIS — R0902 Hypoxemia: Secondary | ICD-10-CM | POA: Diagnosis not present

## 2017-05-11 DIAGNOSIS — G4733 Obstructive sleep apnea (adult) (pediatric): Secondary | ICD-10-CM | POA: Diagnosis not present

## 2017-06-09 DIAGNOSIS — E782 Mixed hyperlipidemia: Secondary | ICD-10-CM | POA: Diagnosis not present

## 2017-06-09 DIAGNOSIS — I5033 Acute on chronic diastolic (congestive) heart failure: Secondary | ICD-10-CM | POA: Diagnosis not present

## 2017-06-09 DIAGNOSIS — I359 Nonrheumatic aortic valve disorder, unspecified: Secondary | ICD-10-CM | POA: Diagnosis not present

## 2017-06-09 DIAGNOSIS — I480 Paroxysmal atrial fibrillation: Secondary | ICD-10-CM | POA: Diagnosis not present

## 2017-06-09 DIAGNOSIS — E1165 Type 2 diabetes mellitus with hyperglycemia: Secondary | ICD-10-CM | POA: Diagnosis not present

## 2017-06-09 DIAGNOSIS — N182 Chronic kidney disease, stage 2 (mild): Secondary | ICD-10-CM | POA: Diagnosis not present

## 2017-06-17 DIAGNOSIS — I4891 Unspecified atrial fibrillation: Secondary | ICD-10-CM | POA: Diagnosis not present

## 2017-06-17 DIAGNOSIS — Z1231 Encounter for screening mammogram for malignant neoplasm of breast: Secondary | ICD-10-CM | POA: Diagnosis not present

## 2017-06-17 DIAGNOSIS — N182 Chronic kidney disease, stage 2 (mild): Secondary | ICD-10-CM | POA: Diagnosis not present

## 2017-06-17 DIAGNOSIS — I5032 Chronic diastolic (congestive) heart failure: Secondary | ICD-10-CM | POA: Diagnosis not present

## 2017-06-17 DIAGNOSIS — E1122 Type 2 diabetes mellitus with diabetic chronic kidney disease: Secondary | ICD-10-CM | POA: Diagnosis not present

## 2017-06-17 DIAGNOSIS — M6281 Muscle weakness (generalized): Secondary | ICD-10-CM | POA: Diagnosis not present

## 2017-06-17 DIAGNOSIS — I130 Hypertensive heart and chronic kidney disease with heart failure and stage 1 through stage 4 chronic kidney disease, or unspecified chronic kidney disease: Secondary | ICD-10-CM | POA: Diagnosis not present

## 2017-06-22 ENCOUNTER — Ambulatory Visit: Payer: Medicare Other

## 2017-06-22 DIAGNOSIS — I359 Nonrheumatic aortic valve disorder, unspecified: Secondary | ICD-10-CM | POA: Diagnosis not present

## 2017-06-23 DIAGNOSIS — I4891 Unspecified atrial fibrillation: Secondary | ICD-10-CM | POA: Diagnosis not present

## 2017-06-23 DIAGNOSIS — M6281 Muscle weakness (generalized): Secondary | ICD-10-CM | POA: Diagnosis not present

## 2017-06-23 DIAGNOSIS — I130 Hypertensive heart and chronic kidney disease with heart failure and stage 1 through stage 4 chronic kidney disease, or unspecified chronic kidney disease: Secondary | ICD-10-CM | POA: Diagnosis not present

## 2017-06-23 DIAGNOSIS — E1122 Type 2 diabetes mellitus with diabetic chronic kidney disease: Secondary | ICD-10-CM | POA: Diagnosis not present

## 2017-06-23 DIAGNOSIS — N182 Chronic kidney disease, stage 2 (mild): Secondary | ICD-10-CM | POA: Diagnosis not present

## 2017-06-23 DIAGNOSIS — I5032 Chronic diastolic (congestive) heart failure: Secondary | ICD-10-CM | POA: Diagnosis not present

## 2017-06-25 DIAGNOSIS — I4891 Unspecified atrial fibrillation: Secondary | ICD-10-CM | POA: Diagnosis not present

## 2017-06-25 DIAGNOSIS — I5032 Chronic diastolic (congestive) heart failure: Secondary | ICD-10-CM | POA: Diagnosis not present

## 2017-06-25 DIAGNOSIS — E1122 Type 2 diabetes mellitus with diabetic chronic kidney disease: Secondary | ICD-10-CM | POA: Diagnosis not present

## 2017-06-25 DIAGNOSIS — N182 Chronic kidney disease, stage 2 (mild): Secondary | ICD-10-CM | POA: Diagnosis not present

## 2017-06-25 DIAGNOSIS — M6281 Muscle weakness (generalized): Secondary | ICD-10-CM | POA: Diagnosis not present

## 2017-06-25 DIAGNOSIS — I130 Hypertensive heart and chronic kidney disease with heart failure and stage 1 through stage 4 chronic kidney disease, or unspecified chronic kidney disease: Secondary | ICD-10-CM | POA: Diagnosis not present

## 2017-07-01 DIAGNOSIS — E1122 Type 2 diabetes mellitus with diabetic chronic kidney disease: Secondary | ICD-10-CM | POA: Diagnosis not present

## 2017-07-01 DIAGNOSIS — N182 Chronic kidney disease, stage 2 (mild): Secondary | ICD-10-CM | POA: Diagnosis not present

## 2017-07-01 DIAGNOSIS — I4891 Unspecified atrial fibrillation: Secondary | ICD-10-CM | POA: Diagnosis not present

## 2017-07-01 DIAGNOSIS — M6281 Muscle weakness (generalized): Secondary | ICD-10-CM | POA: Diagnosis not present

## 2017-07-01 DIAGNOSIS — I130 Hypertensive heart and chronic kidney disease with heart failure and stage 1 through stage 4 chronic kidney disease, or unspecified chronic kidney disease: Secondary | ICD-10-CM | POA: Diagnosis not present

## 2017-07-01 DIAGNOSIS — I5032 Chronic diastolic (congestive) heart failure: Secondary | ICD-10-CM | POA: Diagnosis not present

## 2017-07-03 DIAGNOSIS — M6281 Muscle weakness (generalized): Secondary | ICD-10-CM | POA: Diagnosis not present

## 2017-07-03 DIAGNOSIS — I4891 Unspecified atrial fibrillation: Secondary | ICD-10-CM | POA: Diagnosis not present

## 2017-07-03 DIAGNOSIS — N182 Chronic kidney disease, stage 2 (mild): Secondary | ICD-10-CM | POA: Diagnosis not present

## 2017-07-03 DIAGNOSIS — E1122 Type 2 diabetes mellitus with diabetic chronic kidney disease: Secondary | ICD-10-CM | POA: Diagnosis not present

## 2017-07-03 DIAGNOSIS — I5032 Chronic diastolic (congestive) heart failure: Secondary | ICD-10-CM | POA: Diagnosis not present

## 2017-07-03 DIAGNOSIS — I130 Hypertensive heart and chronic kidney disease with heart failure and stage 1 through stage 4 chronic kidney disease, or unspecified chronic kidney disease: Secondary | ICD-10-CM | POA: Diagnosis not present

## 2017-07-11 ENCOUNTER — Other Ambulatory Visit: Payer: Self-pay | Admitting: Internal Medicine

## 2017-07-27 ENCOUNTER — Other Ambulatory Visit: Payer: Self-pay

## 2017-07-27 MED ORDER — PRODIGY AUTOCODE BLOOD GLUCOSE DEVI: 0 refills | Status: DC

## 2017-07-27 MED ORDER — GLUCOSE BLOOD VI STRP: 1.0000 | ORAL_STRIP | Freq: Three times a day (TID) | 12 refills | Status: DC

## 2017-09-10 ENCOUNTER — Ambulatory Visit: Payer: Self-pay | Admitting: Internal Medicine

## 2017-09-24 ENCOUNTER — Other Ambulatory Visit: Payer: Self-pay | Admitting: Internal Medicine

## 2017-09-28 ENCOUNTER — Other Ambulatory Visit: Payer: Self-pay | Admitting: Internal Medicine

## 2017-09-28 DIAGNOSIS — E1165 Type 2 diabetes mellitus with hyperglycemia: Secondary | ICD-10-CM | POA: Diagnosis not present

## 2017-09-28 DIAGNOSIS — I359 Nonrheumatic aortic valve disorder, unspecified: Secondary | ICD-10-CM | POA: Diagnosis not present

## 2017-09-28 DIAGNOSIS — N182 Chronic kidney disease, stage 2 (mild): Secondary | ICD-10-CM | POA: Diagnosis not present

## 2017-09-28 DIAGNOSIS — E782 Mixed hyperlipidemia: Secondary | ICD-10-CM | POA: Diagnosis not present

## 2017-09-28 DIAGNOSIS — I10 Essential (primary) hypertension: Secondary | ICD-10-CM | POA: Diagnosis not present

## 2017-09-28 DIAGNOSIS — I480 Paroxysmal atrial fibrillation: Secondary | ICD-10-CM | POA: Diagnosis not present

## 2017-09-28 DIAGNOSIS — I5033 Acute on chronic diastolic (congestive) heart failure: Secondary | ICD-10-CM | POA: Diagnosis not present

## 2017-09-29 LAB — COMPREHENSIVE METABOLIC PANEL
ALBUMIN: 3.9 g/dL (ref 3.5–4.7)
ALK PHOS: 88 IU/L (ref 39–117)
ALT: 12 IU/L (ref 0–32)
AST: 17 IU/L (ref 0–40)
Albumin/Globulin Ratio: 1.1 — ABNORMAL LOW (ref 1.2–2.2)
BUN/Creatinine Ratio: 17 (ref 12–28)
BUN: 16 mg/dL (ref 8–27)
Bilirubin Total: 0.4 mg/dL (ref 0.0–1.2)
CO2: 25 mmol/L (ref 20–29)
CREATININE: 0.96 mg/dL (ref 0.57–1.00)
Calcium: 9.2 mg/dL (ref 8.7–10.3)
Chloride: 104 mmol/L (ref 96–106)
GFR calc Af Amer: 62 mL/min/{1.73_m2} (ref >59)
GFR, EST NON AFRICAN AMERICAN: 54 mL/min/{1.73_m2} — AB (ref >59)
Globulin, Total: 3.5 g/dL (ref 1.5–4.5)
Glucose: 108 mg/dL — ABNORMAL HIGH (ref 65–99)
Potassium: 4.3 mmol/L (ref 3.5–5.2)
SODIUM: 144 mmol/L (ref 134–144)
TOTAL PROTEIN: 7.4 g/dL (ref 6.0–8.5)

## 2017-09-29 LAB — CBC WITH DIFFERENTIAL/PLATELET
Basophils Absolute: 0 10*3/uL (ref 0.0–0.2)
Basos: 0 %
EOS (ABSOLUTE): 0.2 10*3/uL (ref 0.0–0.4)
EOS: 3 %
HEMATOCRIT: 33.2 % — AB (ref 34.0–46.6)
HEMOGLOBIN: 10.5 g/dL — AB (ref 11.1–15.9)
IMMATURE GRANS (ABS): 0 10*3/uL (ref 0.0–0.1)
Immature Granulocytes: 0 %
LYMPHS ABS: 2 10*3/uL (ref 0.7–3.1)
LYMPHS: 26 %
MCH: 27.5 pg (ref 26.6–33.0)
MCHC: 31.6 g/dL (ref 31.5–35.7)
MCV: 87 fL (ref 79–97)
MONOCYTES: 9 %
Monocytes Absolute: 0.7 10*3/uL (ref 0.1–0.9)
NEUTROS ABS: 4.6 10*3/uL (ref 1.4–7.0)
Neutrophils: 62 %
Platelets: 287 10*3/uL (ref 150–379)
RBC: 3.82 x10E6/uL (ref 3.77–5.28)
RDW: 14.1 % (ref 12.3–15.4)
WBC: 7.5 10*3/uL (ref 3.4–10.8)

## 2017-09-29 LAB — TSH: TSH: 1.07 u[IU]/mL (ref 0.450–4.500)

## 2017-09-29 LAB — LIPID PANEL WITH LDL/HDL RATIO
CHOLESTEROL TOTAL: 169 mg/dL (ref 100–199)
HDL: 56 mg/dL (ref >39)
LDL CALC: 87 mg/dL (ref 0–99)
LDl/HDL Ratio: 1.6 ratio (ref 0.0–3.2)
Triglycerides: 128 mg/dL (ref 0–149)
VLDL CHOLESTEROL CAL: 26 mg/dL (ref 5–40)

## 2017-09-29 LAB — T4, FREE: FREE T4: 1.27 ng/dL (ref 0.82–1.77)

## 2017-10-13 ENCOUNTER — Ambulatory Visit (INDEPENDENT_AMBULATORY_CARE_PROVIDER_SITE_OTHER): Payer: Medicare Other | Admitting: Internal Medicine

## 2017-10-13 ENCOUNTER — Encounter: Payer: Self-pay | Admitting: Internal Medicine

## 2017-10-13 VITALS — BP 142/74 | HR 89 | Ht 62.0 in | Wt 207.8 lb

## 2017-10-13 DIAGNOSIS — G4730 Sleep apnea, unspecified: Secondary | ICD-10-CM

## 2017-10-13 DIAGNOSIS — E1122 Type 2 diabetes mellitus with diabetic chronic kidney disease: Secondary | ICD-10-CM

## 2017-10-13 DIAGNOSIS — I5032 Chronic diastolic (congestive) heart failure: Secondary | ICD-10-CM | POA: Diagnosis not present

## 2017-10-13 DIAGNOSIS — Z7901 Long term (current) use of anticoagulants: Secondary | ICD-10-CM

## 2017-10-13 DIAGNOSIS — E119 Type 2 diabetes mellitus without complications: Secondary | ICD-10-CM | POA: Diagnosis not present

## 2017-10-13 DIAGNOSIS — I480 Paroxysmal atrial fibrillation: Secondary | ICD-10-CM | POA: Diagnosis not present

## 2017-10-13 DIAGNOSIS — N181 Chronic kidney disease, stage 1: Secondary | ICD-10-CM

## 2017-10-13 LAB — POCT INR: INR: 2.5

## 2017-10-13 LAB — POCT GLYCOSYLATED HEMOGLOBIN (HGB A1C): Hemoglobin A1C: 6.6

## 2017-10-13 NOTE — Progress Notes (Signed)
[facility] [place], [place] [address]  Internal MEDICINE  Office Visit Note  Patient Name: Kimberly Peterson  [id number]  [id number]  Date of Service: 11/02/2017  Chief Complaint  Patient presents with  . Diabetes  . Atrial Fibrillation  . Hyperlipidemia  . Hypertension    HPI  Pt is here for routine follow up. Pt is here with her daughter, feels well, has lost few more lbs, Diabetes is under better control Pacemaker check was normal.    Current Medication: Outpatient Encounter Medications as of 10/13/2017  Medication Sig Note  . aspirin 81 MG tablet Take 81 mg by mouth daily.   Marland Kitchen atorvastatin (LIPITOR) 10 MG tablet TAKE 1 TABLET BY MOUTH ONCE DAILY FOR CHOLESTEROL   . Blood Glucose Monitoring Suppl (PRODIGY AUTOCODE BLOOD GLUCOSE) DEVI Use as directed   . carvedilol (COREG) 25 MG tablet TAKE ONE TABLET BY MOUTH TWICE DAILY   . furosemide (LASIX) 40 MG tablet Take 1 tablet (40 mg total) by mouth daily.   Marland Kitchen glucose blood (PRODIGY NO CODING BLOOD GLUC) test strip 1 each by Other route 3 (three) times daily. Use as instructed   . hydrALAZINE (APRESOLINE) 25 MG tablet Take 25 mg by mouth 3 (three) times daily.   . insulin aspart protamine- aspart (NOVOLOG MIX 70/30) (70-30) 100 UNIT/ML injection Inject 26-34 Units into the skin 2 (two) times daily with a meal. 34 units in morning and 26 units in the evening 09/19/2015: Had night dose  . pantoprazole (PROTONIX) 40 MG tablet Take 1 tablet (40 mg total) by mouth daily.   Marland Kitchen tiotropium (SPIRIVA) 18 MCG inhalation capsule Place 1 capsule (18 mcg total) into inhaler and inhale daily.   Marland Kitchen warfarin (COUMADIN) 5 MG tablet Take 1 tablet (5 mg total) by mouth daily.    No facility-administered encounter medications on file as of 10/13/2017.     Surgical History: Past Surgical History:  Procedure Laterality Date  . aoric valve replacemet      st Jude  . CATARACT EXTRACTION, BILATERAL    . PACEMAKER PLACEMENT    .  VALVE REPLACEMENT      Medical History: Past Medical History:  Diagnosis Date  . Diabetes mellitus without complication ([place])   . Hypertension     Family History: History reviewed. No pertinent family history.  Social History   Socioeconomic History  . Marital status: Married    Spouse name: Not on file  . Number of children: Not on file  . Years of education: Not on file  . Highest education level: Not on file  Occupational History  . Not on file  Social Needs  . Financial resource strain: Not on file  . Food insecurity:    Worry: Not on file    Inability: Not on file  . Transportation needs:    Medical: Not on file    Non-medical: Not on file  Tobacco Use  . Smoking status: Never Smoker  . Smokeless tobacco: Never Used  Substance and Sexual Activity  . Alcohol use: No  . Drug use: No  . Sexual activity: Never  Lifestyle  . Physical activity:    Days per week: Not on file    Minutes per session: Not on file  . Stress: Not on file  Relationships  . Social connections:    Talks on phone: Not on file    Gets together: Not on file    Attends religious service: Not on file  Active member of club or organization: Not on file    Attends meetings of clubs or organizations: Not on file    Relationship status: Not on file  . Intimate partner violence:    Fear of current or ex partner: Not on file    Emotionally abused: Not on file    Physically abused: Not on file    Forced sexual activity: Not on file  Other Topics Concern  . Not on file  Social History Narrative  . Not on file    Review of Systems  Constitutional: Negative for chills, diaphoresis and fatigue.  HENT: Negative for ear pain, postnasal drip and sinus pressure.   Eyes: Negative for photophobia, discharge, redness, itching and visual disturbance.  Respiratory: Negative for cough, shortness of breath and wheezing.   Cardiovascular: Negative for chest pain, palpitations and leg swelling.   Gastrointestinal: Negative for abdominal pain, constipation, diarrhea, nausea and vomiting.  Genitourinary: Negative for dysuria and flank pain.  Musculoskeletal: Negative for arthralgias, back pain, gait problem and neck pain.  Skin: Negative for color change.  Allergic/Immunologic: Negative for environmental allergies and food allergies.  Neurological: Negative for dizziness and headaches.  Hematological: Does not bruise/bleed easily.  Psychiatric/Behavioral: Negative for agitation, behavioral problems (depression) and hallucinations.   Vital Signs: BP (!) 142/74 (BP Location: Left Arm, Patient Position: Sitting)   Pulse 89   Ht 5\' 2"  (1.575 m)   Wt 207 lb 12.8 oz (94.3 kg)   SpO2 93% Comment: 2 liters oxygen  BMI 38.01 kg/m   Physical Exam  Constitutional: She appears well-developed and well-nourished.  HENT:  Head: Normocephalic and atraumatic.  Eyes: Pupils are equal, round, and reactive to light. Conjunctivae and EOM are normal.  Neck: Normal range of motion. Neck supple. No JVD present. No thyromegaly present.  Cardiovascular: Normal rate.  Murmur heard. Irregular with murmur ( st. Jude valve)  Pulmonary/Chest: Effort normal and breath sounds normal. She has no wheezes.  Abdominal: Soft.  Musculoskeletal: She exhibits edema.  Neurological: She is alert. A cranial nerve deficit is present. Coordination abnormal.  Skin: Skin is warm and dry.  Psychiatric: She has a normal mood and affect. Her behavior is normal. Judgment and thought content normal.   Assessment/Plan: 1. Diabetes mellitus with stage 1 chronic kidney disease (HCC) - POCT HgB A1C, Improved and controlled   2. Paroxysmal atrial fibrillation (HCC) - POCT INR. Pt has a pacemaker, heart rtae is controlled   3. Long term (current) use of anticoagulants - POCT INR. Adjusted   4. Sleep apnea in adult - Continue CPAP  5. Chronic diastolic heart failure (HCC) - Controlled  General Counseling: Kimberly Peterson  verbalizes understanding of the findings of todays visit and agrees with plan of treatment. I have discussed any further diagnostic evaluation that may be needed or ordered today. We also reviewed her medications today. she has been encouraged to call the office with any questions or concerns that should arise related to todays visit. Cardiac risk factor modification:  1. Control blood pressure. 2. Exercise as prescribed. 3. Follow low sodium, low fat diet. and low fat and low cholestrol diet. 4. Take Coumadin once a day.   Orders Placed This Encounter  Procedures  . POCT HgB A1C  . POCT INR     Time spent:20 Minutes   Dr [name] Internal medicine

## 2017-11-02 ENCOUNTER — Encounter: Payer: Self-pay | Admitting: Internal Medicine

## 2017-11-03 ENCOUNTER — Other Ambulatory Visit: Payer: Self-pay | Admitting: Internal Medicine

## 2017-11-10 ENCOUNTER — Telehealth: Payer: Self-pay

## 2017-11-10 NOTE — Telephone Encounter (Signed)
Pt daughter called that having pain in right side abdomen under ribcage anf=d constipation advised as per dfk his having bad pain she go to Er or try miralax OTC for constipation

## 2017-12-02 ENCOUNTER — Telehealth: Payer: Self-pay | Admitting: Internal Medicine

## 2017-12-02 NOTE — Telephone Encounter (Signed)
Pt called stating her CPAP Machine is broken, informed that pt that I will get the message to [place] and she would give her a call back.

## 2017-12-10 ENCOUNTER — Ambulatory Visit (INDEPENDENT_AMBULATORY_CARE_PROVIDER_SITE_OTHER): Payer: Medicare Other | Admitting: Internal Medicine

## 2017-12-10 ENCOUNTER — Encounter: Payer: Self-pay | Admitting: Internal Medicine

## 2017-12-10 VITALS — BP 132/82 | HR 94 | Resp 16 | Ht 63.0 in | Wt 202.1 lb

## 2017-12-10 DIAGNOSIS — I5032 Chronic diastolic (congestive) heart failure: Secondary | ICD-10-CM | POA: Diagnosis not present

## 2017-12-10 DIAGNOSIS — Z9989 Dependence on other enabling machines and devices: Secondary | ICD-10-CM | POA: Diagnosis not present

## 2017-12-10 DIAGNOSIS — I2699 Other pulmonary embolism without acute cor pulmonale: Secondary | ICD-10-CM | POA: Diagnosis not present

## 2017-12-10 DIAGNOSIS — J449 Chronic obstructive pulmonary disease, unspecified: Secondary | ICD-10-CM | POA: Diagnosis not present

## 2017-12-10 DIAGNOSIS — J9611 Chronic respiratory failure with hypoxia: Secondary | ICD-10-CM

## 2017-12-10 DIAGNOSIS — G4733 Obstructive sleep apnea (adult) (pediatric): Secondary | ICD-10-CM | POA: Diagnosis not present

## 2017-12-10 NOTE — Progress Notes (Signed)
[facility] [place], [place] [address]  Pulmonary Sleep Medicine   Office Visit Note  Patient Name: Kimberly Peterson DOB: 1930-01-16 MRN [id number]  Date of Service: 12/10/2017  Complaints/HPI:  She is doing fairly well she states that her 1st CPAP machine is no longer working.  She has been having more tiredness.  She is also having a lot of 50.  She has done well otherwise.  No chest pain no palpitations.  Patient has had no fevers.  No admissions to the hospital.  She denies having any cough or congestion at this time.  She spoke with her DME provider and was told that she needs a new device so therefore she will need to have follow-up on evaluation done her last sleep study was done about 5 years ago when she has had no follow-up done at that point.  She does not want to come into the lab for a sleep study and a so therefore we will try to get a home study done for her.  ROS  General: (-) fever, (-) chills, (-) night sweats, (-) weakness Skin: (-) rashes, (-) itching,. Eyes: (-) visual changes, (-) redness, (-) itching. Nose and Sinuses: (-) nasal stuffiness or itchiness, (-) postnasal drip, (-) nosebleeds, (-) sinus trouble. Mouth and Throat: (-) sore throat, (-) hoarseness. Neck: (-) swollen glands, (-) enlarged thyroid, (-) neck pain. Respiratory: - cough, (-) bloody sputum, - shortness of breath, - wheezing. Cardiovascular: - ankle swelling, (-) chest pain. Lymphatic: (-) lymph node enlargement. Neurologic: (-) numbness, (-) tingling. Psychiatric: (-) anxiety, (-) depression   Current Medication: Outpatient Encounter Medications as of 12/10/2017  Medication Sig Note  . aspirin 81 MG tablet Take 81 mg by mouth daily.   Marland Kitchen atorvastatin (LIPITOR) 10 MG tablet TAKE 1 TABLET BY MOUTH ONCE DAILY FOR CHOLESTEROL   . Blood Glucose Monitoring Suppl (PRODIGY AUTOCODE BLOOD GLUCOSE) DEVI Use as directed   . carvedilol (COREG) 25 MG tablet TAKE ONE TABLET BY MOUTH  TWICE DAILY   . furosemide (LASIX) 40 MG tablet TAKE 1 TABLET BY MOUTH ONCE DAILY FOR  FLUID.  MAY  TAKE  HALF  EXTRA  IF  NEEDED   . glucose blood (PRODIGY NO CODING BLOOD GLUC) test strip 1 each by Other route 3 (three) times daily. Use as instructed   . hydrALAZINE (APRESOLINE) 25 MG tablet Take 25 mg by mouth 3 (three) times daily.   . insulin aspart protamine- aspart (NOVOLOG MIX 70/30) (70-30) 100 UNIT/ML injection Inject 26-34 Units into the skin 2 (two) times daily with a meal. 34 units in morning and 26 units in the evening 09/19/2015: Had night dose  . pantoprazole (PROTONIX) 40 MG tablet Take 1 tablet (40 mg total) by mouth daily.   Marland Kitchen tiotropium (SPIRIVA) 18 MCG inhalation capsule Place 1 capsule (18 mcg total) into inhaler and inhale daily.   Marland Kitchen warfarin (COUMADIN) 5 MG tablet Take 1 tablet (5 mg total) by mouth daily.    No facility-administered encounter medications on file as of 12/10/2017.     Surgical History: Past Surgical History:  Procedure Laterality Date  . aoric valve replacemet      st Jude  . CATARACT EXTRACTION, BILATERAL    . PACEMAKER PLACEMENT    . VALVE REPLACEMENT      Medical History: Past Medical History:  Diagnosis Date  . Diabetes mellitus without complication ([place])   . Hypertension     Family History: History reviewed. No  pertinent family history.  Social History: Social History   Socioeconomic History  . Marital status: Married    Spouse name: Not on file  . Number of children: Not on file  . Years of education: Not on file  . Highest education level: Not on file  Occupational History  . Not on file  Social Needs  . Financial resource strain: Not on file  . Food insecurity:    Worry: Not on file    Inability: Not on file  . Transportation needs:    Medical: Not on file    Non-medical: Not on file  Tobacco Use  . Smoking status: Never Smoker  . Smokeless tobacco: Never Used  Substance and Sexual Activity  . Alcohol use: No  .  Drug use: No  . Sexual activity: Never  Lifestyle  . Physical activity:    Days per week: Not on file    Minutes per session: Not on file  . Stress: Not on file  Relationships  . Social connections:    Talks on phone: Not on file    Gets together: Not on file    Attends religious service: Not on file    Active member of club or organization: Not on file    Attends meetings of clubs or organizations: Not on file    Relationship status: Not on file  . Intimate partner violence:    Fear of current or ex partner: Not on file    Emotionally abused: Not on file    Physically abused: Not on file    Forced sexual activity: Not on file  Other Topics Concern  . Not on file  Social History Narrative  . Not on file    Vital Signs: Blood pressure 132/82, pulse 94, resp. rate 16, height 5\' 3"  (1.6 m), weight 202 lb 1.6 oz (91.7 kg), SpO2 95 %.  Examination: General Appearance: The patient is well-developed, well-nourished, and in no distress. Skin: Gross inspection of skin unremarkable. Head: normocephalic, no gross deformities. Eyes: no gross deformities noted. ENT: ears appear grossly normal no exudates. Neck: Supple. No thyromegaly. No LAD. Respiratory: no rhonchi noted. Cardiovascular: Normal S1 and S2 without murmur or rub. Extremities: No cyanosis. pulses are equal. Neurologic: Alert and oriented. No involuntary movements.  LABS: Recent Results (from the past 2160 hour(s))  CBC with Differential/Platelet     Status: Abnormal   Collection Time: 09/28/17  9:28 AM  Result Value Ref Range   WBC 7.5 3.4 - 10.8 x10E3/uL   RBC 3.82 3.77 - 5.28 x10E6/uL   Hemoglobin 10.5 (L) 11.1 - 15.9 g/dL   Hematocrit 33.2 (L) 34.0 - 46.6 %   MCV 87 79 - 97 fL   MCH 27.5 26.6 - 33.0 pg   MCHC 31.6 31.5 - 35.7 g/dL   RDW 14.1 12.3 - 15.4 %   Platelets 287 150 - 379 x10E3/uL   Neutrophils 62 Not Estab. %   Lymphs 26 Not Estab. %   Monocytes 9 Not Estab. %   Eos 3 Not Estab. %   Basos 0 Not  Estab. %   Neutrophils Absolute 4.6 1.4 - 7.0 x10E3/uL   Lymphocytes Absolute 2.0 0.7 - 3.1 x10E3/uL   Monocytes Absolute 0.7 0.1 - 0.9 x10E3/uL   EOS (ABSOLUTE) 0.2 0.0 - 0.4 x10E3/uL   Basophils Absolute 0.0 0.0 - 0.2 x10E3/uL   Immature Granulocytes 0 Not Estab. %   Immature Grans (Abs) 0.0 0.0 - 0.1 x10E3/uL  Comprehensive metabolic panel  Status: Abnormal   Collection Time: 09/28/17  9:28 AM  Result Value Ref Range   Glucose 108 (H) 65 - 99 mg/dL   BUN 16 8 - 27 mg/dL   Creatinine, Ser 0.96 0.57 - 1.00 mg/dL   GFR calc non Af Amer 54 (L) >59 mL/min/1.73   GFR calc Af Amer 62 >59 mL/min/1.73   BUN/Creatinine Ratio 17 12 - 28   Sodium 144 134 - 144 mmol/L   Potassium 4.3 3.5 - 5.2 mmol/L   Chloride 104 96 - 106 mmol/L   CO2 25 20 - 29 mmol/L   Calcium 9.2 8.7 - 10.3 mg/dL   Total Protein 7.4 6.0 - 8.5 g/dL   Albumin 3.9 3.5 - 4.7 g/dL   Globulin, Total 3.5 1.5 - 4.5 g/dL   Albumin/Globulin Ratio 1.1 (L) 1.2 - 2.2   Bilirubin Total 0.4 0.0 - 1.2 mg/dL   Alkaline Phosphatase 88 39 - 117 IU/L   AST 17 0 - 40 IU/L   ALT 12 0 - 32 IU/L  Lipid Panel With LDL/HDL Ratio     Status: None   Collection Time: 09/28/17  9:28 AM  Result Value Ref Range   Cholesterol, Total 169 100 - 199 mg/dL   Triglycerides 128 0 - 149 mg/dL   HDL 56 >39 mg/dL   VLDL Cholesterol Cal 26 5 - 40 mg/dL   LDL Calculated 87 0 - 99 mg/dL   LDl/HDL Ratio 1.6 0.0 - 3.2 ratio    Comment:                                     LDL/HDL Ratio                                             Men  Women                               1/2 Avg.Risk  1.0    1.5                                   Avg.Risk  3.6    3.2                                2X Avg.Risk  6.2    5.0                                3X Avg.Risk  8.0    6.1   T4, free     Status: None   Collection Time: 09/28/17  9:28 AM  Result Value Ref Range   Free T4 1.27 0.82 - 1.77 ng/dL  TSH     Status: None   Collection Time: 09/28/17  9:28 AM  Result Value  Ref Range   TSH 1.070 0.450 - 4.500 uIU/mL  POCT HgB A1C     Status: Abnormal   Collection Time: 10/13/17 10:48 AM  Result Value Ref Range   Hemoglobin A1C 6.6   POCT INR     Status: Normal  Collection Time: 10/13/17 10:48 AM  Result Value Ref Range   INR 2.5 29.4     Radiology: Dg Chest Portable 1 View  Result Date: 12/03/2015 CLINICAL DATA:  Acute onset of respiratory distress. Initial encounter. EXAM: PORTABLE CHEST 1 VIEW COMPARISON:  Chest radiograph performed 09/19/2015 FINDINGS: The lungs are well-aerated. Vascular congestion is noted. Mild bibasilar opacities may reflect mild interstitial edema. There is no evidence of pleural effusion or pneumothorax. The cardiomediastinal silhouette is enlarged. The patient is status post median sternotomy. A pacemaker is noted overlying the left chest wall, with leads ending overlying the right atrium and right ventricle. No acute osseous abnormalities are seen. IMPRESSION: Vascular congestion and cardiomegaly. Mild bibasilar airspace opacities may reflect mild interstitial edema. Electronically Signed   By: Garald Balding M.D.   On: 12/03/2015 22:11    No results found.  No results found.    Assessment and Plan: Patient Active Problem List   Diagnosis Date Noted  . CHF (congestive heart failure) ([place]) 12/04/2015  . Diabetes mellitus ([place]) 12/04/2015  . COPD exacerbation ([place]) 09/20/2015  . Acute on chronic respiratory failure with hypoxia ([place]) 09/19/2015  . Diastolic CHF, acute on chronic (HCC) 09/19/2015  . Benign essential HTN 09/19/2015  . Controlled diabetes mellitus without complication, with long-term current use of insulin ([place]) 09/19/2015  . Hyperlipidemia, mixed 09/04/2014  . Pulmonary edema 09/04/2014  . Pulmonary embolism ([place]) 09/23/2013    1. Chronic respiratory failure with hypoxia  Continue with supportive care oxygen therapy she is actually tolerating her oxygen well on the current settings. 2. OSA  Continue with  therapy for her sleep apnea as noted she needs a new machine so we will try to get this set up. 3. Pulmonary embolism  Stable at this time no recurrent 4. COPD advanced disease we will continue with present management prognosis guarded 5. CHF diastolic compensated will continue with supportive care  General Counseling: I have discussed the findings of the evaluation and examination with Kimberly Peterson.  I have also discussed any further diagnostic evaluation thatmay be needed or ordered today. Ta verbalizes understanding of the findings of todays visit. We also reviewed her medications today and discussed drug interactions and side effects including but not limited excessive drowsiness and altered mental states. We also discussed that there is always a risk not just to her but also people around her. she has been encouraged to call the office with any questions or concerns that should arise related to todays visit.    Time spent: 22min  I have personally obtained a history, examined the patient, evaluated laboratory and imaging results, formulated the assessment and plan and placed orders.    Allyne Gee, MD [facility] Pulmonary and Critical Care Sleep medicine

## 2017-12-10 NOTE — Patient Instructions (Signed)
Sleep Study   Sleep Apnea Sleep apnea is a condition that affects breathing. People with sleep apnea have moments during sleep when their breathing pauses briefly or gets shallow. Sleep apnea can cause these symptoms:  Trouble staying asleep.  Sleepiness or tiredness during the day.  Irritability.  Loud snoring.  Morning headaches.  Trouble concentrating.  Forgetting things.  Less interest in sex.  Being sleepy for no reason.  Mood swings.  Personality changes.  Depression.  Waking up a lot during the night to pee (urinate).  Dry mouth.  Sore throat.  Follow these instructions at home:  Make any changes in your routine that your doctor recommends.  Eat a healthy, well-balanced diet.  Take over-the-counter and prescription medicines only as told by your doctor.  Avoid using alcohol, calming medicines (sedatives), and narcotic medicines.  Take steps to lose weight if you are overweight.  If you were given a machine (device) to use while you sleep, use it only as told by your doctor.  Do not use any tobacco products, such as cigarettes, chewing tobacco, and e-cigarettes. If you need help quitting, ask your doctor.  Keep all follow-up visits as told by your doctor. This is important. Contact a doctor if:  The machine that you were given to use during sleep is uncomfortable or does not seem to be working.  Your symptoms do not get better.  Your symptoms get worse. Get help right away if:  Your chest hurts.  You have trouble breathing in enough air (shortness of breath).  You have an uncomfortable feeling in your back, arms, or stomach.  You have trouble talking.  One side of your body feels weak.  A part of your face is hanging down (drooping). These symptoms may be an emergency. Do not wait to see if the symptoms will go away. Get medical help right away. Call your local emergency services (911 in the [place] Do not drive yourself to the  hospital. This information is not intended to replace advice given to you by your health care provider. Make sure you discuss any questions you have with your health care provider. Document Released: 04/01/2008 Document Revised: 02/17/2016 Document Reviewed: 04/02/2015 Elsevier Interactive Patient Education  [employer].

## 2017-12-21 ENCOUNTER — Other Ambulatory Visit: Payer: Medicare Other | Admitting: Internal Medicine

## 2017-12-21 DIAGNOSIS — G4733 Obstructive sleep apnea (adult) (pediatric): Secondary | ICD-10-CM | POA: Diagnosis not present

## 2017-12-21 DIAGNOSIS — Z9989 Dependence on other enabling machines and devices: Secondary | ICD-10-CM | POA: Diagnosis not present

## 2017-12-30 ENCOUNTER — Other Ambulatory Visit: Payer: Self-pay | Admitting: Internal Medicine

## 2017-12-31 ENCOUNTER — Ambulatory Visit: Payer: Self-pay | Admitting: Internal Medicine

## 2018-01-03 NOTE — Procedures (Signed)
[facility] [place], [place] [address]  Sleep Specialist: Allyne Gee, MD [place] Sleep Study Interpretation  Patient Name: Kimberly Peterson Patient MR PNTIRW:[id number] DOB:1929-12-06  Date of Study: December 21, 2017  Indications for study:  Excessive daytime somnolence and snoring  BMI:  35.7 kilograms/meter squared       Respiratory Data:  Total AHI:  21.0 per hour  Total Obstructive Apneas:  2  Total Central Apneas:  0  Total Mixed Apneas:  0  Total Hypopneas:  162  If the AHI is greater than 5 per hour patient qualifies for PAP evaluation  Oximetry Data:  Oxygen Desaturation Index: 31.5  Lowest Desaturation:  69%  Cardiac Data:  Minimum Heart Rate:  68  Maximum Heart Rate:  94   Impression / Diagnosis:   this home apnea link study is consistent with moderate obstructive sleep apnea-hypopnea syndrome with an apnea hypopnea index of 21 per hour.  Patient has severe oxygen desaturation noted with a low saturation of 69%.  Patient would benefit from a CPAP titration study in the lab secondary to history of COPD and severe oxygen desaturation.  GENERAL Recommendations:  1.  Consider Auto PAP with pressure ranges 5-20 cmH20 with download, or facility based PAP Titration Study  2.  Consider PAP interface mask fitted for patient comfort, Heated Humidification & PAP compliance monitoring (1 month, 3 months & 12 months after PAP initiation)  3. Consider treatment with mandibular advancement splint (MAS) or referral to an ENT surgeon for modification to the upper airway if the patient prefers an alternate therapy or the PAP trial is unsuccessful  4. Sleep hygiene measures should be discussed with the patient  5. Behavioral therapy such as weight reduction or smoking cessation as appropriate for the patient  6. Advise patient against the use of alcohol or sedatives in so much as these substances can worsen excessive daytime sleepiness  and respiratory disturbances of sleep  7. Advise patient against participating in potentially dangerous activities while drowsy such as operating a motor vehicle, heavy equipment or power tools as it can put them and others in danger  8. Advise patient of the long term consequences of OSA if left untreated, need for treatment and close follow up  9. Clinical follow up as deemed necessary     This Level III home sleep study was performed using the [employer], a 4 channel screening device subject to limitations. Depending on actual total sleep time, not measured in this study, the AHI (sum of apneas and hypopneas/hr of sleep) and therefore the severity of sleep apnea may be underestimated. As with any single night study, including Level 1 attended PSG, severity of sleep apnea may also be underestimated due to the lack of supine and/or REM sleep.  The interpretation associated with this report is based on normal values and degrees of severity in accordance with AASM parameters and/or estimated from multiple sources in the literature for adults ages 95-80+. These may not agree with the displayed values. The patient's treating physician should use the interpretation and recommendations in conjunction with the overall clinical evaluation and treatment of the patient.  Some of the terminology used in this scored [facility]nea[facility] report was developed several years ago and may not always be in accordance with current nomenclature. This in no way affects the accuracy of the data or the reliability of the interpretation and recommendations.

## 2018-01-18 ENCOUNTER — Ambulatory Visit (INDEPENDENT_AMBULATORY_CARE_PROVIDER_SITE_OTHER): Payer: Medicare Other | Admitting: Podiatry

## 2018-01-18 ENCOUNTER — Encounter: Payer: Self-pay | Admitting: Podiatry

## 2018-01-18 DIAGNOSIS — E1142 Type 2 diabetes mellitus with diabetic polyneuropathy: Secondary | ICD-10-CM

## 2018-01-18 DIAGNOSIS — L84 Corns and callosities: Secondary | ICD-10-CM | POA: Diagnosis not present

## 2018-01-18 DIAGNOSIS — B351 Tinea unguium: Secondary | ICD-10-CM | POA: Diagnosis not present

## 2018-01-18 DIAGNOSIS — M79676 Pain in unspecified toe(s): Secondary | ICD-10-CM | POA: Diagnosis not present

## 2018-01-18 NOTE — Progress Notes (Signed)
Complaint:  Visit Type: Patient returns to my office for continued preventative foot care services. Complaint: Patient states" my nails have grown long and thick and become painful to walk and wear shoes"  The patient presents for preventative foot care services. No changes to ROS.  Painful corns on toes both feet. This patient lives in [place] and her daughter has been doing her nails.  Podiatric Exam: Vascular: dorsalis pedis and posterior tibial pulses are palpable bilateral. Capillary return is immediate. Temperature gradient is WNL. Skin turgor WNL  Sensorium: Normal Semmes Weinstein monofilament test. Normal tactile sensation bilaterally. Nail Exam: Pt has thick disfigured discolored nails with subungual debris noted bilateral entire nail hallux through fifth toenails Ulcer Exam: There is no evidence of ulcer or pre-ulcerative changes or infection. Orthopedic Exam: Muscle tone and strength are WNL. No limitations in general ROM. No crepitus or effusions noted. Foot type and digits show no abnormalities. Bony prominences are unremarkable. Skin: No Porokeratosis. No infection or ulcers.  Distal clavi third toe  B/L  Diagnosis:  Onychomycosis, , Pain in right toe, pain in left toes,  Clavi third B/L  Treatment & Plan Procedures and Treatment: Consent by patient was obtained for treatment procedures. The patient understood the discussion of treatment and procedures well. All questions were answered thoroughly reviewed. Debridement of mycotic and hypertrophic toenails, 1 through 5 bilateral and clearing of subungual debris. No ulceration, no infection noted. Debridement of clavi. Return Visit-Office Procedure: Patient instructed to return to the office for a follow up visit 3 months for continued evaluation and treatment.    Gardiner Barefoot DPM

## 2018-01-21 ENCOUNTER — Ambulatory Visit (INDEPENDENT_AMBULATORY_CARE_PROVIDER_SITE_OTHER): Payer: Medicare Other | Admitting: Internal Medicine

## 2018-01-21 ENCOUNTER — Encounter: Payer: Self-pay | Admitting: Internal Medicine

## 2018-01-21 VITALS — BP 110/72 | HR 83 | Resp 16 | Ht 62.0 in | Wt 202.2 lb

## 2018-01-21 DIAGNOSIS — I5032 Chronic diastolic (congestive) heart failure: Secondary | ICD-10-CM

## 2018-01-21 DIAGNOSIS — I10 Essential (primary) hypertension: Secondary | ICD-10-CM | POA: Diagnosis not present

## 2018-01-21 DIAGNOSIS — G4733 Obstructive sleep apnea (adult) (pediatric): Secondary | ICD-10-CM

## 2018-01-21 DIAGNOSIS — J9621 Acute and chronic respiratory failure with hypoxia: Secondary | ICD-10-CM

## 2018-01-21 DIAGNOSIS — J441 Chronic obstructive pulmonary disease with (acute) exacerbation: Secondary | ICD-10-CM

## 2018-01-21 NOTE — Progress Notes (Signed)
[facility] [place], [place] [address]  Internal MEDICINE  Office Visit Note  Patient Name: Kimberly Peterson  [id number]  [id number]  Date of Service: 01/21/2018  Chief Complaint  Patient presents with  . COPD    follow up sleep study     HPI Pt here for follow up on sleep study. She denies SOB. She denies use of inhalers. She reports she can not sleep more than 4 hours at night while using current machine. She complains of ill fitting mask as well as suffocating feeling with her current machine.     Current Medication: Outpatient Encounter Medications as of 01/21/2018  Medication Sig Note  . aspirin 81 MG tablet Take 81 mg by mouth daily.   Marland Kitchen atorvastatin (LIPITOR) 10 MG tablet TAKE 1 TABLET BY MOUTH ONCE DAILY FOR CHOLESTEROL   . Blood Glucose Monitoring Suppl (PRODIGY AUTOCODE BLOOD GLUCOSE) DEVI Use as directed   . CARTIA XT 240 MG 24 hr capsule Take 240 mg by mouth daily.   . carvedilol (COREG) 25 MG tablet TAKE ONE TABLET BY MOUTH TWICE DAILY   . furosemide (LASIX) 40 MG tablet TAKE 1 TABLET BY MOUTH ONCE DAILY FOR  FLUID.  MAY  TAKE  HALF  EXTRA  IF  NEEDED   . glucose blood (PRODIGY NO CODING BLOOD GLUC) test strip 1 each by Other route 3 (three) times daily. Use as instructed   . hydrALAZINE (APRESOLINE) 25 MG tablet Take 25 mg by mouth 3 (three) times daily.   . insulin aspart protamine- aspart (NOVOLOG MIX 70/30) (70-30) 100 UNIT/ML injection Inject 26-34 Units into the skin 2 (two) times daily with a meal. 34 units in morning and 26 units in the evening 09/19/2015: Had night dose  . lisinopril-hydrochlorothiazide (PRINZIDE,ZESTORETIC) 20-12.5 MG tablet Take by mouth.   . pantoprazole (PROTONIX) 40 MG tablet Take 1 tablet (40 mg total) by mouth daily.   . pravastatin (PRAVACHOL) 40 MG tablet Take by mouth.   . tiotropium (SPIRIVA) 18 MCG inhalation capsule Place 1 capsule (18 mcg total) into inhaler and inhale daily.   Marland Kitchen warfarin (COUMADIN) 5 MG  tablet TAKE ONE TABLET BY MOUTH ONCE DAILY AT 5-6PM AND TAKE ONE AND ONE-HALF TABLET BY MOUTH ON THURSDAY    No facility-administered encounter medications on file as of 01/21/2018.     Surgical History: Past Surgical History:  Procedure Laterality Date  . aoric valve replacemet      st Jude  . CATARACT EXTRACTION, BILATERAL    . PACEMAKER PLACEMENT    . VALVE REPLACEMENT      Medical History: Past Medical History:  Diagnosis Date  . Diabetes mellitus without complication ([place])   . Hypertension     Family History: History reviewed. No pertinent family history.  Social History   Socioeconomic History  . Marital status: Married    Spouse name: Not on file  . Number of children: Not on file  . Years of education: Not on file  . Highest education level: Not on file  Occupational History  . Not on file  Social Needs  . Financial resource strain: Not on file  . Food insecurity:    Worry: Not on file    Inability: Not on file  . Transportation needs:    Medical: Not on file    Non-medical: Not on file  Tobacco Use  . Smoking status: Never Smoker  . Smokeless tobacco: Never Used  Substance and Sexual Activity  .  Alcohol use: No  . Drug use: No  . Sexual activity: Never  Lifestyle  . Physical activity:    Days per week: Not on file    Minutes per session: Not on file  . Stress: Not on file  Relationships  . Social connections:    Talks on phone: Not on file    Gets together: Not on file    Attends religious service: Not on file    Active member of club or organization: Not on file    Attends meetings of clubs or organizations: Not on file    Relationship status: Not on file  . Intimate partner violence:    Fear of current or ex partner: Not on file    Emotionally abused: Not on file    Physically abused: Not on file    Forced sexual activity: Not on file  Other Topics Concern  . Not on file  Social History Narrative  . Not on file      Review of  Systems  Constitutional: Negative for chills, fatigue and unexpected weight change.  HENT: Negative for congestion, rhinorrhea, sneezing and sore throat.   Eyes: Negative for photophobia, pain and redness.  Respiratory: Negative for cough, chest tightness and shortness of breath.   Cardiovascular: Negative for chest pain and palpitations.  Gastrointestinal: Negative for abdominal pain, constipation, diarrhea, nausea and vomiting.  Endocrine: Negative.   Genitourinary: Negative for dysuria and frequency.  Musculoskeletal: Negative for arthralgias, back pain, joint swelling and neck pain.  Skin: Negative for rash.  Allergic/Immunologic: Negative.   Neurological: Negative for tremors and numbness.  Hematological: Negative for adenopathy. Does not bruise/bleed easily.  Psychiatric/Behavioral: Negative for behavioral problems and sleep disturbance. The patient is not nervous/anxious.     Vital Signs: BP 110/72   Pulse 83   Resp 16   Ht 5\' 2"  (1.575 m)   Wt 202 lb 3.2 oz (91.7 kg)   SpO2 90%   BMI 36.98 kg/m    Physical Exam  Constitutional: She is oriented to person, place, and time. She appears well-developed and well-nourished. No distress.  HENT:  Head: Normocephalic and atraumatic.  Mouth/Throat: Oropharynx is clear and moist. No oropharyngeal exudate.  Eyes: Pupils are equal, round, and reactive to light. EOM are normal.  Neck: Normal range of motion. Neck supple. No JVD present. No tracheal deviation present. No thyromegaly present.  Cardiovascular: Normal rate, regular rhythm and normal heart sounds. Exam reveals no gallop and no friction rub.  No murmur heard. Pulmonary/Chest: No respiratory distress. She has no wheezes. She has no rales. She exhibits no tenderness.  Breath sounds diminished in bilateral bases.  Resp effort is mildly increased.   Abdominal: Soft. There is no tenderness. There is no guarding.  Musculoskeletal: Normal range of motion.  Lymphadenopathy:     She has no cervical adenopathy.  Neurological: She is alert and oriented to person, place, and time. No cranial nerve deficit.  Skin: Skin is warm and dry. She is not diaphoretic.  Psychiatric: She has a normal mood and affect. Her behavior is normal. Judgment and thought content normal.  Nursing note and vitals reviewed.    Assessment/Plan: 1. Obstructive sleep apnea Needs new auto titrating CPAP machine.  Pt unable to come to lab for test.   - For home use only DME continuous positive airway pressure (CPAP)  2. Acute on chronic respiratory failure with hypoxia (HCC) Consider oxygen at night with cpap due to desat of 69% on sleep study.  3. Benign essential HTN Appears controlled. Continue current medications.  4. COPD exacerbation ([place]) Continue present management, as disease is advanced.  5. CHF (congestive heart failure), NYHA class I, chronic, diastolic ([place]) Continue supportive care. Stable at this time  General Counseling: Marlaine verbalizes understanding of the findings of todays visit and agrees with plan of treatment. I have discussed any further diagnostic evaluation that may be needed or ordered today. We also reviewed her medications today. she has been encouraged to call the office with any questions or concerns that should arise related to todays visit.    No orders of the defined types were placed in this encounter.   No orders of the defined types were placed in this encounter.   Time spent: 25 Minutes   This patient was seen by Orson Gear AGNP-C in Collaboration with Dr Devona Konig as a part of collaborative care agreement

## 2018-01-28 ENCOUNTER — Telehealth: Payer: Self-pay

## 2018-01-28 ENCOUNTER — Encounter: Payer: Self-pay | Admitting: Internal Medicine

## 2018-01-28 NOTE — Patient Instructions (Signed)

## 2018-01-28 NOTE — Telephone Encounter (Signed)
Faxed CPAP order to [facility]

## 2018-02-16 ENCOUNTER — Ambulatory Visit: Payer: Self-pay | Admitting: Nurse Practitioner

## 2018-02-24 ENCOUNTER — Ambulatory Visit (INDEPENDENT_AMBULATORY_CARE_PROVIDER_SITE_OTHER): Payer: Medicare Other | Admitting: Nurse Practitioner

## 2018-02-24 ENCOUNTER — Telehealth: Payer: Self-pay

## 2018-02-24 ENCOUNTER — Encounter: Payer: Self-pay | Admitting: Nurse Practitioner

## 2018-02-24 VITALS — BP 130/80 | HR 68 | Resp 16 | Ht 62.0 in | Wt 211.0 lb

## 2018-02-24 DIAGNOSIS — J449 Chronic obstructive pulmonary disease, unspecified: Secondary | ICD-10-CM | POA: Diagnosis not present

## 2018-02-24 DIAGNOSIS — Z5181 Encounter for therapeutic drug level monitoring: Secondary | ICD-10-CM | POA: Diagnosis not present

## 2018-02-24 DIAGNOSIS — E1165 Type 2 diabetes mellitus with hyperglycemia: Secondary | ICD-10-CM

## 2018-02-24 DIAGNOSIS — R600 Localized edema: Secondary | ICD-10-CM

## 2018-02-24 DIAGNOSIS — I4891 Unspecified atrial fibrillation: Secondary | ICD-10-CM

## 2018-02-24 DIAGNOSIS — Z9981 Dependence on supplemental oxygen: Secondary | ICD-10-CM | POA: Diagnosis not present

## 2018-02-24 LAB — POCT INR: INR: 3.3 — AB (ref 2.0–3.0)

## 2018-02-24 NOTE — Telephone Encounter (Signed)
Mailed signed oxygen CMN to the patient's oxygen supplier located in [place]

## 2018-02-24 NOTE — Progress Notes (Signed)
[facility] [place],  [address]  Internal MEDICINE  Office Visit Note  Patient Name: Kimberly Peterson  [id number]  [id number]  Date of Service: 02/25/2018  Chief Complaint  Patient presents with  . Diabetes  . Hypertension  . Leg Swelling    right    Th patient uses nasal cannul oxygen at all times. Without this, she is short of breat, even with minimal exertion.   Diabetes  She presents for her follow-up diabetic visit. She has type 2 diabetes mellitus. Her disease course has been stable. There are no hypoglycemic associated symptoms. Pertinent negatives for hypoglycemia include no dizziness, headaches or pallor. Associated symptoms include fatigue and weakness. Pertinent negatives for diabetes include no chest pain. There are no hypoglycemic complications. Symptoms are stable. Risk factors for coronary artery disease include post-menopausal, hypertension, dyslipidemia and family history. Current diabetic treatment includes insulin injections. She is compliant with treatment all of the time. She is following a generally healthy diet. Meal planning includes avoidance of concentrated sweets. She has not had a previous visit with a dietitian. There is no change in her home blood glucose trend. An ACE inhibitor/angiotensin II receptor blocker is being taken. She does not see a podiatrist.Eye exam is not current.  Hypertension  This is a chronic problem. The current episode started more than 1 year ago. The problem is unchanged. The problem is controlled. Associated symptoms include peripheral edema and shortness of breath. Pertinent negatives include no chest pain, headaches, neck pain or palpitations. There are no associated agents to hypertension. Risk factors for coronary artery disease include diabetes mellitus, dyslipidemia, family history, obesity, sedentary lifestyle and post-menopausal state. Past treatments include beta blockers, ACE inhibitors and  diuretics. The current treatment provides moderate improvement. Compliance problems include exercise.        Current Medication: Outpatient Encounter Medications as of 02/24/2018  Medication Sig Note  . aspirin 81 MG tablet Take 81 mg by mouth daily.   Marland Kitchen atorvastatin (LIPITOR) 10 MG tablet TAKE 1 TABLET BY MOUTH ONCE DAILY FOR CHOLESTEROL   . Blood Glucose Monitoring Suppl (PRODIGY AUTOCODE BLOOD GLUCOSE) DEVI Use as directed   . CARTIA XT 240 MG 24 hr capsule Take 240 mg by mouth daily.   . carvedilol (COREG) 25 MG tablet TAKE ONE TABLET BY MOUTH TWICE DAILY   . furosemide (LASIX) 40 MG tablet TAKE 1 TABLET BY MOUTH ONCE DAILY FOR  FLUID.  MAY  TAKE  HALF  EXTRA  IF  NEEDED   . glucose blood (PRODIGY NO CODING BLOOD GLUC) test strip 1 each by Other route 3 (three) times daily. Use as instructed   . hydrALAZINE (APRESOLINE) 25 MG tablet Take 25 mg by mouth 3 (three) times daily.   . insulin aspart protamine- aspart (NOVOLOG MIX 70/30) (70-30) 100 UNIT/ML injection Inject 26-34 Units into the skin 2 (two) times daily with a meal. 34 units in morning and 26 units in the evening 09/19/2015: Had night dose  . lisinopril-hydrochlorothiazide (PRINZIDE,ZESTORETIC) 20-12.5 MG tablet Take by mouth.   . OXYGEN Inhale into the lungs continuous.   . pantoprazole (PROTONIX) 40 MG tablet Take 1 tablet (40 mg total) by mouth daily.   . pravastatin (PRAVACHOL) 40 MG tablet Take by mouth.   . tiotropium (SPIRIVA) 18 MCG inhalation capsule Place 1 capsule (18 mcg total) into inhaler and inhale daily.   Marland Kitchen warfarin (COUMADIN) 5 MG tablet TAKE ONE TABLET BY MOUTH ONCE DAILY AT 5-6PM AND TAKE  ONE AND ONE-HALF TABLET BY MOUTH ON THURSDAY    No facility-administered encounter medications on file as of 02/24/2018.     Surgical History: Past Surgical History:  Procedure Laterality Date  . aoric valve replacemet      st Jude  . CATARACT EXTRACTION, BILATERAL    . PACEMAKER PLACEMENT    . VALVE REPLACEMENT       Medical History: Past Medical History:  Diagnosis Date  . Diabetes mellitus without complication ([place])   . Hypertension     Family History: History reviewed. No pertinent family history.  Social History   Socioeconomic History  . Marital status: Married    Spouse name: Not on file  . Number of children: Not on file  . Years of education: Not on file  . Highest education level: Not on file  Occupational History  . Not on file  Social Needs  . Financial resource strain: Not on file  . Food insecurity:    Worry: Not on file    Inability: Not on file  . Transportation needs:    Medical: Not on file    Non-medical: Not on file  Tobacco Use  . Smoking status: Never Smoker  . Smokeless tobacco: Never Used  Substance and Sexual Activity  . Alcohol use: No  . Drug use: No  . Sexual activity: Never  Lifestyle  . Physical activity:    Days per week: Not on file    Minutes per session: Not on file  . Stress: Not on file  Relationships  . Social connections:    Talks on phone: Not on file    Gets together: Not on file    Attends religious service: Not on file    Active member of club or organization: Not on file    Attends meetings of clubs or organizations: Not on file    Relationship status: Not on file  . Intimate partner violence:    Fear of current or ex partner: Not on file    Emotionally abused: Not on file    Physically abused: Not on file    Forced sexual activity: Not on file  Other Topics Concern  . Not on file  Social History Narrative  . Not on file      Review of Systems  Constitutional: Positive for fatigue. Negative for chills and diaphoresis.  HENT: Negative for ear pain, postnasal drip and sinus pressure.   Eyes: Negative.  Negative for photophobia, discharge, redness, itching and visual disturbance.  Respiratory: Positive for shortness of breath. Negative for cough, chest tightness and wheezing.   Cardiovascular: Positive for leg swelling.  Negative for chest pain and palpitations.  Gastrointestinal: Negative for abdominal distention, abdominal pain, constipation, diarrhea, nausea and vomiting.  Endocrine:       Blood sugars doing well   Genitourinary: Negative for dysuria and flank pain.  Musculoskeletal: Negative for arthralgias, back pain, gait problem and neck pain.  Skin: Negative for color change, pallor and rash.  Allergic/Immunologic: Positive for environmental allergies. Negative for food allergies.  Neurological: Positive for weakness. Negative for dizziness and headaches.  Hematological: Negative for adenopathy. Does not bruise/bleed easily.  Psychiatric/Behavioral: Negative for agitation, behavioral problems (depression) and hallucinations.    Today's Vitals   02/24/18 1016 02/24/18 1019  BP:  130/80  Pulse:  68  Resp:  16  SpO2: (!) 87% (!) 87%  Weight: 211 lb (95.7 kg) 211 lb (95.7 kg)  Height: 5\' 2"  (1.575 m) 5\' 2"  (1.575  m)   Physical Exam  Constitutional: She appears well-developed and well-nourished.  HENT:  Head: Normocephalic and atraumatic.  Nose: Nose normal.  Mouth/Throat: Oropharynx is clear and moist.  Eyes: Pupils are equal, round, and reactive to light. Conjunctivae and EOM are normal.  Neck: Normal range of motion. Neck supple. No JVD present. Carotid bruit is not present. No tracheal deviation present. No thyromegaly present.  Cardiovascular: Normal rate.  Murmur heard. Irregular with murmur ( st. Jude valve) Moderate bilateral leg swelling, worse on right side of the leg. Non pitting, but tight. No warmth or evience of infection or clot. Patient admits she has not taken her furosemide today.   Pulmonary/Chest: Effort normal and breath sounds normal. She has no wheezes.  The patient's O2 sats dropped to 87% without exertion while off oxygen.   Abdominal: Soft.  Musculoskeletal: She exhibits edema.  Lymphadenopathy:    She has no cervical adenopathy.  Neurological: She is alert. A  cranial nerve deficit is present. Coordination abnormal.  Skin: Skin is warm and dry.  Psychiatric: She has a normal mood and affect. Her behavior is normal. Judgment and thought content normal.  Nursing note and vitals reviewed.  Assessment/Plan: 1. Uncontrolled type 2 diabetes mellitus with hyperglycemia (HCC) - POCT HgB A1C 6.8 today. Continue diabetic medication as prescribed.   2. Atrial fibrillation, unspecified type ([place]) Chronic warfarin therapy.    3. Encounter for therapeutic drug monitoring - POCT INR 3.3 today. Will have her continue current warfarin dosing. Continue to check INR monthly.   4. Chronic obstructive pulmonary disease, unspecified COPD type ([place]) Patient stable. Continue to use Spiriva as prescribed.   5. Dependence on supplemental oxygen Patient had oxygen desaturation to 87% on room air without exertion. Continues to need oxygen via nasal cannula at 2 liters per minute at all times.   6. Lower extremity edema Recommend she take furosemide when she gets home, elevate her feet, and increase water intake.   General Counseling: Jinan verbalizes understanding of the findings of todays visit and agrees with plan of treatment. I have discussed any further diagnostic evaluation that may be needed or ordered today. We also reviewed her medications today. she has been encouraged to call the office with any questions or concerns that should arise related to todays visit.  Diabetes Counseling:  1. Addition of ACE inh/ ARB'S for nephroprotection. Microalbumin is updated  2. Diabetic foot care, prevention of complications. Podiatry consult 3. Exercise and lose weight.  4. Diabetic eye examination, Diabetic eye exam is updated  5. Monitor blood sugar closlely. nutrition counseling.  6. Sign and symptoms of hypoglycemia including shaking sweating,confusion and headaches.   This patient was seen by Leretha Pol FNP Collaboration with Dr [name] as a part of  collaborative care agreement  Orders Placed This Encounter  Procedures  . POCT HgB A1C  . POCT INR      Time spent: 73 Minutes      Dr [name] Internal medicine

## 2018-02-25 DIAGNOSIS — Z9981 Dependence on supplemental oxygen: Secondary | ICD-10-CM | POA: Insufficient documentation

## 2018-02-25 DIAGNOSIS — R600 Localized edema: Secondary | ICD-10-CM | POA: Insufficient documentation

## 2018-02-25 DIAGNOSIS — E1165 Type 2 diabetes mellitus with hyperglycemia: Secondary | ICD-10-CM | POA: Insufficient documentation

## 2018-02-25 DIAGNOSIS — Z7901 Long term (current) use of anticoagulants: Secondary | ICD-10-CM | POA: Insufficient documentation

## 2018-02-25 DIAGNOSIS — Z5181 Encounter for therapeutic drug level monitoring: Secondary | ICD-10-CM

## 2018-02-25 DIAGNOSIS — I4891 Unspecified atrial fibrillation: Secondary | ICD-10-CM | POA: Insufficient documentation

## 2018-02-25 LAB — POCT GLYCOSYLATED HEMOGLOBIN (HGB A1C): HEMOGLOBIN A1C: 6.8 % — AB (ref 4.0–5.6)

## 2018-03-05 ENCOUNTER — Telehealth: Payer: Self-pay

## 2018-03-05 NOTE — Telephone Encounter (Signed)
Mickel Baas from [facility] calling regarding patient's cmn form for o2 recert. They received chart notes on two different days but not the cmn form. Spoke with leslie and called laura back and advised her that they can either mail or drop off the form at the office and we can fill it out again because we did mail it on 8/21.

## 2018-03-16 ENCOUNTER — Telehealth: Payer: Self-pay

## 2018-03-16 NOTE — Telephone Encounter (Signed)
[facility] SIGNED CMN FOR O2 EQUIPMENT. PUT IN [employer] BOX.

## 2018-04-06 ENCOUNTER — Encounter: Payer: Self-pay | Admitting: Internal Medicine

## 2018-04-06 NOTE — Progress Notes (Signed)
SCANNED IN HOME SLEEP TEST DONE ON 12/21/17

## 2018-05-17 ENCOUNTER — Other Ambulatory Visit: Payer: Self-pay | Admitting: Internal Medicine

## 2018-06-01 ENCOUNTER — Ambulatory Visit: Payer: Self-pay | Admitting: Nurse Practitioner

## 2018-06-02 ENCOUNTER — Ambulatory Visit (INDEPENDENT_AMBULATORY_CARE_PROVIDER_SITE_OTHER): Payer: Medicare Other | Admitting: Adult Health

## 2018-06-02 ENCOUNTER — Telehealth: Payer: Self-pay

## 2018-06-02 ENCOUNTER — Telehealth: Payer: Self-pay | Admitting: Adult Health

## 2018-06-02 ENCOUNTER — Encounter: Payer: Self-pay | Admitting: Adult Health

## 2018-06-02 VITALS — BP 140/68 | HR 84 | Resp 16 | Ht 62.0 in | Wt 207.0 lb

## 2018-06-02 DIAGNOSIS — I4891 Unspecified atrial fibrillation: Secondary | ICD-10-CM

## 2018-06-02 DIAGNOSIS — E1165 Type 2 diabetes mellitus with hyperglycemia: Secondary | ICD-10-CM | POA: Diagnosis not present

## 2018-06-02 DIAGNOSIS — I10 Essential (primary) hypertension: Secondary | ICD-10-CM

## 2018-06-02 DIAGNOSIS — Z5181 Encounter for therapeutic drug level monitoring: Secondary | ICD-10-CM | POA: Diagnosis not present

## 2018-06-02 DIAGNOSIS — G4733 Obstructive sleep apnea (adult) (pediatric): Secondary | ICD-10-CM | POA: Diagnosis not present

## 2018-06-02 DIAGNOSIS — Z9981 Dependence on supplemental oxygen: Secondary | ICD-10-CM

## 2018-06-02 DIAGNOSIS — Z9989 Dependence on other enabling machines and devices: Secondary | ICD-10-CM

## 2018-06-02 DIAGNOSIS — J449 Chronic obstructive pulmonary disease, unspecified: Secondary | ICD-10-CM | POA: Diagnosis not present

## 2018-06-02 LAB — POCT GLYCOSYLATED HEMOGLOBIN (HGB A1C): Hemoglobin A1C: 5.6 % (ref 4.0–5.6)

## 2018-06-02 LAB — POCT INR: INR: 5 — AB (ref 2.0–3.0)

## 2018-06-02 MED ORDER — PANTOPRAZOLE SODIUM 40 MG PO TBEC: 40.0000 mg | DELAYED_RELEASE_TABLET | Freq: Every day | ORAL | 1 refills | Status: DC

## 2018-06-02 NOTE — Patient Instructions (Signed)

## 2018-06-02 NOTE — Progress Notes (Signed)
[facility] [place], [place] [address]  Internal MEDICINE  Office Visit Note  Patient Name: Kimberly Peterson  [id number]  [id number]  Date of Service: 06/02/2018  Chief Complaint  Patient presents with  . Medical Management of Chronic Issues  . Breathing Problem    pt have been having shortness of breath for about 2-3wks and its more so when she is moving around. pt had fluid build up, and some wheezing  . Pain    stomach issues when her blood sugar is low, pt feels sick    HPI Pt is here complaining of SOB that has been intermittent for 2-3 weeks.  She reports DOE, but this is not a new finding for her.  She reports she has been having some issues with her oxygen tanks.  The tank she has with her today in the office is empty, and she was short of breath until she was placed on a new tank in our office.  She is sitting up in the chair, and reporting that her breathing feels back to her normal at this time.  She also reports that when her blood sugar gets low, she starts to feel bad and that is her signal to eat something.  She denies having felt this feeling recently.   Current Medication: Outpatient Encounter Medications as of 06/02/2018  Medication Sig Note  . aspirin 81 MG tablet Take 81 mg by mouth daily.   Marland Kitchen atorvastatin (LIPITOR) 10 MG tablet TAKE 1 TABLET BY MOUTH ONCE DAILY FOR CHOLESTEROL   . Blood Glucose Monitoring Suppl (PRODIGY AUTOCODE BLOOD GLUCOSE) DEVI Use as directed   . CARTIA XT 240 MG 24 hr capsule Take 240 mg by mouth daily.   . carvedilol (COREG) 25 MG tablet TAKE ONE TABLET BY MOUTH TWICE DAILY   . furosemide (LASIX) 40 MG tablet TAKE 1 TABLET BY MOUTH ONCE DAILY FOR  FLUID.  MAY  TAKE  HALF  EXTRA  IF  NEEDED   . glucose blood (PRODIGY NO CODING BLOOD GLUC) test strip 1 each by Other route 3 (three) times daily. Use as instructed   . hydrALAZINE (APRESOLINE) 25 MG tablet Take 25 mg by mouth 3 (three) times daily.   . insulin aspart  protamine- aspart (NOVOLOG MIX 70/30) (70-30) 100 UNIT/ML injection Inject 26-34 Units into the skin 2 (two) times daily with a meal. 34 units in morning and 26 units in the evening 09/19/2015: Had night dose  . lisinopril-hydrochlorothiazide (PRINZIDE,ZESTORETIC) 20-12.5 MG tablet Take by mouth.   . OXYGEN Inhale into the lungs continuous.   . pantoprazole (PROTONIX) 40 MG tablet Take 1 tablet (40 mg total) by mouth daily.   . pravastatin (PRAVACHOL) 40 MG tablet Take by mouth.   . tiotropium (SPIRIVA) 18 MCG inhalation capsule Place 1 capsule (18 mcg total) into inhaler and inhale daily.   Marland Kitchen warfarin (COUMADIN) 5 MG tablet TAKE ONE TABLET BY MOUTH ONCE DAILY AT 5-6PM AND TAKE ONE AND ONE-HALF TABLET BY MOUTH ON THURSDAY   . [DISCONTINUED] pantoprazole (PROTONIX) 40 MG tablet TAKE ONE TABLET BY MOUTH ONCE DAILY BEFORE  SUPPER    No facility-administered encounter medications on file as of 06/02/2018.     Surgical History: Past Surgical History:  Procedure Laterality Date  . aoric valve replacemet      st Jude  . CATARACT EXTRACTION, BILATERAL    . PACEMAKER PLACEMENT    . VALVE REPLACEMENT      Medical History: Past  Medical History:  Diagnosis Date  . Diabetes mellitus without complication ([place])   . Hypertension     Family History: History reviewed. No pertinent family history.  Social History   Socioeconomic History  . Marital status: Married    Spouse name: Not on file  . Number of children: Not on file  . Years of education: Not on file  . Highest education level: Not on file  Occupational History  . Not on file  Social Needs  . Financial resource strain: Not on file  . Food insecurity:    Worry: Not on file    Inability: Not on file  . Transportation needs:    Medical: Not on file    Non-medical: Not on file  Tobacco Use  . Smoking status: Never Smoker  . Smokeless tobacco: Never Used  Substance and Sexual Activity  . Alcohol use: No  . Drug use: No  .  Sexual activity: Never  Lifestyle  . Physical activity:    Days per week: Not on file    Minutes per session: Not on file  . Stress: Not on file  Relationships  . Social connections:    Talks on phone: Not on file    Gets together: Not on file    Attends religious service: Not on file    Active member of club or organization: Not on file    Attends meetings of clubs or organizations: Not on file    Relationship status: Not on file  . Intimate partner violence:    Fear of current or ex partner: Not on file    Emotionally abused: Not on file    Physically abused: Not on file    Forced sexual activity: Not on file  Other Topics Concern  . Not on file  Social History Narrative  . Not on file      Review of Systems  Constitutional: Negative for chills, fatigue and unexpected weight change.  HENT: Negative for congestion, rhinorrhea, sneezing and sore throat.   Eyes: Negative for photophobia, pain and redness.  Respiratory: Negative for cough, chest tightness and shortness of breath.   Cardiovascular: Negative for chest pain and palpitations.  Gastrointestinal: Negative for abdominal pain, constipation, diarrhea, nausea and vomiting.  Endocrine: Negative.   Genitourinary: Negative for dysuria and frequency.  Musculoskeletal: Negative for arthralgias, back pain, joint swelling and neck pain.  Skin: Negative for rash.  Allergic/Immunologic: Negative.   Neurological: Negative for tremors and numbness.  Hematological: Negative for adenopathy. Does not bruise/bleed easily.  Psychiatric/Behavioral: Negative for behavioral problems and sleep disturbance. The patient is not nervous/anxious.     Vital Signs: BP 140/68 (BP Location: Left Arm, Patient Position: Sitting, Cuff Size: Large)   Pulse 84   Resp 16   Ht 5\' 2"  (1.575 m)   Wt 207 lb (93.9 kg)   SpO2 93%   BMI 37.86 kg/m    Physical Exam  Constitutional: She is oriented to person, place, and time. She appears  well-developed and well-nourished. No distress.  HENT:  Head: Normocephalic and atraumatic.  Mouth/Throat: Oropharynx is clear and moist. No oropharyngeal exudate.  Eyes: Pupils are equal, round, and reactive to light. EOM are normal.  Neck: Normal range of motion. Neck supple. No JVD present. No tracheal deviation present. No thyromegaly present.  Cardiovascular: Normal rate, regular rhythm and normal heart sounds. Exam reveals no gallop and no friction rub.  No murmur heard. Pulmonary/Chest: Effort normal and breath sounds normal. No respiratory distress. She  has no wheezes. She has no rales. She exhibits no tenderness.  Abdominal: Soft. There is no tenderness. There is no guarding.  Musculoskeletal: Normal range of motion.  Lymphadenopathy:    She has no cervical adenopathy.  Neurological: She is alert and oriented to person, place, and time. No cranial nerve deficit.  Skin: Skin is warm and dry. She is not diaphoretic.  Psychiatric: She has a normal mood and affect. Her behavior is normal. Judgment and thought content normal.  Nursing note and vitals reviewed.   Assessment/Plan: 1. Chronic obstructive pulmonary disease, unspecified COPD type ([place]) Stable, encourage patient to continue taking inhalers and other medications as directed.  Continue to use continuous oxygen.  She reports that she is generally feeling well now that she has an appropriate amount of oxygen.  2. Dependence on supplemental oxygen Patient has having some difficulty with her oxygen tank.  She ran out of the house with this new tank today it was gone before she got to the office.  Apparently while in the office we figured out she was placing a tank on continuous-flow and that was running the tank out quickly.  She now has to take adjusted to the appropriate setting and her daughter went home to get her a new tank.   3. Atrial fibrillation, unspecified type ([place]) Stable, continue current therapy. - POCT INR  4.  Encounter for therapeutic drug monitoring Patient's INR is 5.0 today.  Informed patient to skip her next 3 doses and restart medications as before.  She will come in next week to recheck her INR.  5. Uncontrolled type 2 diabetes mellitus with hyperglycemia (HCC) Hemoglobin A1c found to be 5.6 today.  Continue current management of diabetes. - POCT HgB A1C  6. OSA on CPAP Encourage patient to continue using CPAP machine.  7. Benign essential HTN Stable, continue current medications as prescribed.  General Counseling: [place] verbalizes understanding of the findings of todays visit and agrees with plan of treatment. I have discussed any further diagnostic evaluation that may be needed or ordered today. We also reviewed her medications today. she has been encouraged to call the office with any questions or concerns that should arise related to todays visit.    Orders Placed This Encounter  Procedures  . POCT INR  . POCT HgB A1C    Meds ordered this encounter  Medications  . pantoprazole (PROTONIX) 40 MG tablet    Sig: Take 1 tablet (40 mg total) by mouth daily.    Dispense:  90 tablet    Refill:  1    Time spent: 30 Minutes   This patient was seen by Orson Gear AGNP-C in Collaboration with Dr [name] as a part of collaborative care agreement     Kendell Bane AGNP-C Internal medicine

## 2018-06-02 NOTE — Telephone Encounter (Signed)
Patient is to hold coumadin for the next three days  Will continue on Sunday and come back for recheck in the middle of next week .  Patient advised

## 2018-06-02 NOTE — Telephone Encounter (Signed)
INR: 5.0 PT 60.0

## 2018-06-09 ENCOUNTER — Telehealth: Payer: Self-pay

## 2018-06-09 ENCOUNTER — Ambulatory Visit: Payer: Medicare Other

## 2018-06-09 ENCOUNTER — Encounter: Payer: Self-pay | Admitting: Adult Health

## 2018-06-09 ENCOUNTER — Ambulatory Visit (INDEPENDENT_AMBULATORY_CARE_PROVIDER_SITE_OTHER): Payer: Medicare Other

## 2018-06-09 VITALS — BP 122/56 | HR 82 | Resp 16 | Ht 62.0 in | Wt 202.0 lb

## 2018-06-09 VITALS — BP 122/56 | Ht 62.0 in | Wt 202.0 lb

## 2018-06-09 DIAGNOSIS — Z6836 Body mass index (BMI) 36.0-36.9, adult: Secondary | ICD-10-CM

## 2018-06-09 DIAGNOSIS — Z7901 Long term (current) use of anticoagulants: Secondary | ICD-10-CM

## 2018-06-09 DIAGNOSIS — Z9989 Dependence on other enabling machines and devices: Secondary | ICD-10-CM | POA: Diagnosis not present

## 2018-06-09 DIAGNOSIS — J9611 Chronic respiratory failure with hypoxia: Secondary | ICD-10-CM

## 2018-06-09 DIAGNOSIS — G4733 Obstructive sleep apnea (adult) (pediatric): Secondary | ICD-10-CM | POA: Diagnosis not present

## 2018-06-09 DIAGNOSIS — E1165 Type 2 diabetes mellitus with hyperglycemia: Secondary | ICD-10-CM | POA: Diagnosis not present

## 2018-06-09 DIAGNOSIS — R300 Dysuria: Secondary | ICD-10-CM

## 2018-06-09 DIAGNOSIS — I10 Essential (primary) hypertension: Secondary | ICD-10-CM

## 2018-06-09 DIAGNOSIS — J449 Chronic obstructive pulmonary disease, unspecified: Secondary | ICD-10-CM

## 2018-06-09 DIAGNOSIS — Z0001 Encounter for general adult medical examination with abnormal findings: Secondary | ICD-10-CM | POA: Diagnosis not present

## 2018-06-09 DIAGNOSIS — I5032 Chronic diastolic (congestive) heart failure: Secondary | ICD-10-CM

## 2018-06-09 DIAGNOSIS — Z9981 Dependence on supplemental oxygen: Secondary | ICD-10-CM

## 2018-06-09 DIAGNOSIS — E782 Mixed hyperlipidemia: Secondary | ICD-10-CM

## 2018-06-09 LAB — POCT INR: INR: 1.2 — AB (ref 2.0–3.0)

## 2018-06-09 MED ORDER — PANTOPRAZOLE SODIUM 40 MG PO TBEC: 40.0000 mg | DELAYED_RELEASE_TABLET | Freq: Every day | ORAL | 1 refills | Status: DC

## 2018-06-09 NOTE — Telephone Encounter (Signed)
[place] PT/INR FOR HOME

## 2018-06-09 NOTE — Progress Notes (Addendum)
[facility] [place], [place] [address]  Internal MEDICINE  Office Visit Note  Patient Name: Kimberly Peterson  [id number]  [id number]  Date of Service: 06/09/2018  Chief Complaint  Patient presents with  . Annual Exam    medicare annual well visit   . Diabetes  . Hypertension     HPI Pt is here for routine health maintenance examination.  Pt is a frail-appearing 84 year old obese African-American female. She has a history of type 2 diabetes, chronic respiratory failure with hypoxia, OSA, hypertension, CHF, COPD and hyperlipidemia.  Her most recent hemoglobin A1c was 5.6.  Her diabetes appears well controlled on current regimen.  Her INR today was 1.2.  That is much improved from 2 weeks ago when it was 5.0.  Patient will restart her Coumadin at this visit.  Patient remains on 24-hour oxygen except when she uses her CPAP at night.  She is not hypertensive at today's visit.  She denies any chest pain or shortness of breath.  She also denies any difficulty breathing beyond her normal dyspnea.  She does not use tobacco, alcohol, or illicit drugs.  Current Medication: Outpatient Encounter Medications as of 06/09/2018  Medication Sig Note  . aspirin 81 MG tablet Take 81 mg by mouth daily.   Marland Kitchen atorvastatin (LIPITOR) 10 MG tablet TAKE 1 TABLET BY MOUTH ONCE DAILY FOR CHOLESTEROL   . Blood Glucose Monitoring Suppl (PRODIGY AUTOCODE BLOOD GLUCOSE) DEVI Use as directed   . CARTIA XT 240 MG 24 hr capsule Take 240 mg by mouth daily.   . carvedilol (COREG) 25 MG tablet TAKE ONE TABLET BY MOUTH TWICE DAILY   . furosemide (LASIX) 40 MG tablet TAKE 1 TABLET BY MOUTH ONCE DAILY FOR  FLUID.  MAY  TAKE  HALF  EXTRA  IF  NEEDED   . glucose blood (PRODIGY NO CODING BLOOD GLUC) test strip 1 each by Other route 3 (three) times daily. Use as instructed   . hydrALAZINE (APRESOLINE) 25 MG tablet Take 25 mg by mouth 3 (three) times daily.   . insulin aspart protamine- aspart (NOVOLOG  MIX 70/30) (70-30) 100 UNIT/ML injection Inject 26-34 Units into the skin 2 (two) times daily with a meal. 34 units in morning and 26 units in the evening 09/19/2015: Had night dose  . lisinopril-hydrochlorothiazide (PRINZIDE,ZESTORETIC) 20-12.5 MG tablet Take by mouth.   . OXYGEN Inhale into the lungs continuous.   . pantoprazole (PROTONIX) 40 MG tablet Take 1 tablet (40 mg total) by mouth daily.   . pravastatin (PRAVACHOL) 40 MG tablet Take by mouth.   . tiotropium (SPIRIVA) 18 MCG inhalation capsule Place 1 capsule (18 mcg total) into inhaler and inhale daily.   Marland Kitchen warfarin (COUMADIN) 5 MG tablet TAKE ONE TABLET BY MOUTH ONCE DAILY AT 5-6PM AND TAKE ONE AND ONE-HALF TABLET BY MOUTH ON THURSDAY (Patient taking differently: Take half tab every night at 6 pm.)    No facility-administered encounter medications on file as of 06/09/2018.     Surgical History: Past Surgical History:  Procedure Laterality Date  . aoric valve replacemet      st Jude  . CATARACT EXTRACTION, BILATERAL    . PACEMAKER PLACEMENT    . VALVE REPLACEMENT      Medical History: Past Medical History:  Diagnosis Date  . Diabetes mellitus without complication ([place])   . Hypertension     Family History: History reviewed. No pertinent family history.  Depression screen [facility] 2/9 06/09/2018 06/02/2018 01/21/2018  12/10/2017 10/13/2017  Decreased Interest 0 0 0 0 0  Down, Depressed, Hopeless 0 0 0 0 0  PHQ - 2 Score 0 0 0 0 0    Functional Status Survey: Is the patient deaf or have difficulty hearing?: No Does the patient have difficulty seeing, even when wearing glasses/contacts?: No Does the patient have difficulty concentrating, remembering, or making decisions?: No Does the patient have difficulty walking or climbing stairs?: Yes Does the patient have difficulty dressing or bathing?: No Does the patient have difficulty doing errands alone such as visiting a doctor's office or shopping?: Yes  MMSE - [place]  Exam 06/09/2018  Orientation to time 5  Orientation to Place 5  Registration 3  Attention/ Calculation 5  Recall 3  Language- name 2 objects 2  Language- repeat 1  Language- follow 3 step command 3  Language- read & follow direction 1  Write a sentence 1  Copy design 1  Total score 30    Fall Risk  06/09/2018 06/02/2018 01/21/2018 12/10/2017 10/13/2017  Falls in the past year? 0 0 No No No  Comment - - - - -     Review of Systems  Constitutional: Negative for chills, fatigue and unexpected weight change.  HENT: Negative for congestion, rhinorrhea, sneezing and sore throat.   Eyes: Negative for photophobia, pain and redness.  Respiratory: Negative for cough, chest tightness and shortness of breath.        Chronic oxygen use.  Cardiovascular: Negative for chest pain and palpitations.  Gastrointestinal: Negative for abdominal pain, constipation, diarrhea, nausea and vomiting.  Endocrine: Negative.   Genitourinary: Negative for dysuria and frequency.  Musculoskeletal: Negative for arthralgias, back pain, joint swelling and neck pain.  Skin: Negative for rash.  Allergic/Immunologic: Negative.   Neurological: Negative for tremors and numbness.  Hematological: Negative for adenopathy. Does not bruise/bleed easily.  Psychiatric/Behavioral: Negative for behavioral problems and sleep disturbance. The patient is not nervous/anxious.      Vital Signs: BP (!) 122/56 (BP Location: Left Arm, Patient Position: Sitting, Cuff Size: Normal)   Pulse 82   Resp 16   Ht 5\' 2"  (1.575 m)   Wt 202 lb (91.6 kg)   SpO2 96%   BMI 36.95 kg/m    Physical Exam  Constitutional: She is oriented to person, place, and time. She appears well-developed and well-nourished. No distress.  HENT:  Head: Normocephalic and atraumatic.  Mouth/Throat: Oropharynx is clear and moist. No oropharyngeal exudate.  Eyes: Pupils are equal, round, and reactive to light. EOM are normal.  Neck: Normal range of motion. Neck  supple. No JVD present. No tracheal deviation present. No thyromegaly present.  Cardiovascular: Normal rate, regular rhythm and normal heart sounds. Exam reveals no gallop and no friction rub.  No murmur heard. Pulmonary/Chest: No respiratory distress. She has no wheezes. She has no rales. She exhibits no tenderness.  Course breath sounds noted.  Pt is on chronic Oxygen.   Abdominal: Soft. There is no tenderness. There is no guarding.  Musculoskeletal: Normal range of motion.  Lymphadenopathy:    She has no cervical adenopathy.  Neurological: She is alert and oriented to person, place, and time. No cranial nerve deficit.  Skin: Skin is warm and dry. She is not diaphoretic.  Psychiatric: She has a normal mood and affect. Her behavior is normal. Judgment and thought content normal.  Nursing note and vitals reviewed.    LABS: Recent Results (from the past 2160 hour(s))  POCT INR  Status: Abnormal   Collection Time: 06/02/18 11:32 AM  Result Value Ref Range   INR 5.0 (A) 2.0 - 3.0    Comment: pt 60.0 inr 5.0  POCT HgB A1C     Status: None   Collection Time: 06/02/18 11:54 AM  Result Value Ref Range   Hemoglobin A1C 5.6 4.0 - 5.6 %   HbA1c POC (<> result, manual entry)     HbA1c, POC (prediabetic range)     HbA1c, POC (controlled diabetic range)    POCT INR     Status: Abnormal   Collection Time: 06/09/18  2:33 PM  Result Value Ref Range   INR 1.2 (A) 2.0 - 3.0    Comment: PT 14.3, take a half tab every night     Assessment/Plan: 1. Encounter for general adult medical examination with abnormal findings Lab work recently performed.  Patient is up-to-date on preventative health maintenance except for mammogram.  Patient declined to get a new mammogram stating that she is 84 years old and it is not a priority for her at this time.  2. Uncontrolled type 2 diabetes mellitus with hyperglycemia (HCC) Stable, most recent hemoglobin A1c was well-controlled at 5.6.  3. Dependence on  supplemental oxygen Patient continues to be dependent on submental oxygen at 2 L/min via nasal cannula.  She is currently using a portable concentrator when out and about and has a stationary concentrator at home.  4. Chronic respiratory failure with hypoxia ([place]) Patient should continue to use oxygen as prescribed.  5. OSA on CPAP Patient should continue to use her CPAP machine at night with oxygen bleeding through.  We discussed compliance as well as the importance of CPAP use to prevent long-term damage.  6. Benign essential HTN BP is slightly decreased at this visit.  We will continue to monitor in the future.  7. Encounter for current long-term use of anticoagulants INR today 1.2 patient will restart her Coumadin with half dose.  She will take 2.5 mg daily until further notice. We are also setting patient up for home INR use so that we may monitor her more closely. - POCT INR  8. CHF (congestive heart failure), NYHA class I, chronic, diastolic (HCC) Stable, continue to follow cardiology as indicated.  9. Chronic obstructive pulmonary disease, unspecified COPD type ([place]) Stable, continue to take medications as prescribed.  10. Hyperlipidemia, mixed Stable, continue current medication regimen.  11. Class 2 severe obesity due to excess calories with serious comorbidity and body mass index (BMI) of 36.0 to 36.9 in adult [facility] Obesity Counseling: Risk Assessment: An assessment of behavioral risk factors was made today and includes lack of exercise sedentary lifestyle, lack of portion control and poor dietary habits.  Risk Modification Advice: She was counseled on portion control guidelines. Restricting daily caloric intake to. . The detrimental long term effects of obesity on her health and ongoing poor compliance was also discussed with the patient.  12. Dysuria - UA/M w/rflx Culture, Routine  General Counseling: Lash[place] verbalizes understanding of the findings of todays visit and  agrees with plan of treatment. I have discussed any further diagnostic evaluation that may be needed or ordered today. We also reviewed her medications today. she has been encouraged to call the office with any questions or concerns that should arise related to todays visit.   Orders Placed This Encounter  Procedures  . UA/M w/rflx Culture, Routine  . POCT INR    No orders of the defined types were placed in  this encounter.   Time spent: 25 Minutes   This patient was seen by Orson Gear AGNP-C in Collaboration with Dr [name] as a part of collaborative care agreement    Kendell Bane AGNP-C Internal Medicine

## 2018-06-09 NOTE — Progress Notes (Signed)
Cancel appt

## 2018-06-10 LAB — UA/M W/RFLX CULTURE, ROUTINE
Bilirubin, UA: NEGATIVE
Glucose, UA: NEGATIVE
KETONES UA: NEGATIVE
Leukocytes, UA: NEGATIVE
NITRITE UA: NEGATIVE
PROTEIN UA: NEGATIVE
RBC UA: NEGATIVE
Specific Gravity, UA: 1.007 (ref 1.005–1.030)
UUROB: 1 mg/dL (ref 0.2–1.0)
pH, UA: 7.5 (ref 5.0–7.5)

## 2018-06-10 LAB — MICROSCOPIC EXAMINATION: Bacteria, UA: NONE SEEN

## 2018-06-21 ENCOUNTER — Other Ambulatory Visit: Payer: Self-pay | Admitting: Internal Medicine

## 2018-06-23 DIAGNOSIS — I4821 Permanent atrial fibrillation: Secondary | ICD-10-CM | POA: Diagnosis not present

## 2018-06-23 DIAGNOSIS — Z7901 Long term (current) use of anticoagulants: Secondary | ICD-10-CM | POA: Diagnosis not present

## 2018-06-24 ENCOUNTER — Ambulatory Visit (INDEPENDENT_AMBULATORY_CARE_PROVIDER_SITE_OTHER): Payer: Medicare Other

## 2018-06-24 ENCOUNTER — Telehealth: Payer: Self-pay

## 2018-06-24 DIAGNOSIS — I4891 Unspecified atrial fibrillation: Secondary | ICD-10-CM | POA: Diagnosis not present

## 2018-06-24 NOTE — Telephone Encounter (Signed)
Pt daughter advised her inr 1.4 take coumadin 5mg  whole tab today and continue coumadin 2.5 daily (which is half tab from coumadin 5 mg)

## 2018-06-24 NOTE — Progress Notes (Signed)
Pt Mdinr done INR 1.4 as per heather pt daughter advised take coumadin  5mg  whole pill today and continue coumadin 2.5 mg daily and repeat in one week

## 2018-06-28 ENCOUNTER — Ambulatory Visit (INDEPENDENT_AMBULATORY_CARE_PROVIDER_SITE_OTHER): Payer: Medicare Other | Admitting: Nurse Practitioner

## 2018-06-28 DIAGNOSIS — I4891 Unspecified atrial fibrillation: Secondary | ICD-10-CM | POA: Diagnosis not present

## 2018-06-28 NOTE — Progress Notes (Signed)
Pt inr was 1.1 as per adam pt daughter advised take coumadin 5mg  1 tab today and alternate with coumadin 5mg (1/2 tab) and continue same pattern and recheck next week

## 2018-07-02 ENCOUNTER — Ambulatory Visit: Payer: Self-pay | Admitting: Nurse Practitioner

## 2018-07-05 ENCOUNTER — Ambulatory Visit (INDEPENDENT_AMBULATORY_CARE_PROVIDER_SITE_OTHER): Payer: Medicare Other

## 2018-07-05 DIAGNOSIS — I4891 Unspecified atrial fibrillation: Secondary | ICD-10-CM

## 2018-07-05 NOTE — Progress Notes (Signed)
Pt INR 1.6 as per adam advised pt take coumadin 5 mg daily and recheck next week

## 2018-07-12 ENCOUNTER — Ambulatory Visit (INDEPENDENT_AMBULATORY_CARE_PROVIDER_SITE_OTHER): Payer: Medicare Other | Admitting: Nurse Practitioner

## 2018-07-12 DIAGNOSIS — I4891 Unspecified atrial fibrillation: Secondary | ICD-10-CM

## 2018-07-12 DIAGNOSIS — I4821 Permanent atrial fibrillation: Secondary | ICD-10-CM | POA: Diagnosis not present

## 2018-07-12 DIAGNOSIS — Z7901 Long term (current) use of anticoagulants: Secondary | ICD-10-CM | POA: Diagnosis not present

## 2018-07-12 LAB — PROTIME-INR: INR: 1.8 — AB (ref 0.9–1.1)

## 2018-07-13 NOTE — Progress Notes (Signed)
Pt inr is 1.8. continue current dosing and will recheck in one week. Pt daughter was advised of this.

## 2018-07-14 ENCOUNTER — Encounter: Payer: Self-pay | Admitting: Nurse Practitioner

## 2018-07-14 ENCOUNTER — Encounter: Payer: Self-pay | Admitting: Adult Health

## 2018-07-14 LAB — PROTIME-INR
INR: 1.1 (ref 0.9–1.1)
INR: 1.4 — AB (ref 0.9–1.1)
INR: 1.6 — AB (ref 0.9–1.1)

## 2018-07-16 DIAGNOSIS — E113523 Type 2 diabetes mellitus with proliferative diabetic retinopathy with traction retinal detachment involving the macula, bilateral: Secondary | ICD-10-CM | POA: Diagnosis not present

## 2018-07-19 ENCOUNTER — Ambulatory Visit (INDEPENDENT_AMBULATORY_CARE_PROVIDER_SITE_OTHER): Payer: Medicare Other

## 2018-07-19 DIAGNOSIS — I4891 Unspecified atrial fibrillation: Secondary | ICD-10-CM | POA: Diagnosis not present

## 2018-07-19 LAB — PROTIME-INR: INR: 2.1 — AB (ref 0.9–1.1)

## 2018-07-19 NOTE — Progress Notes (Signed)
Pt inr 2.1 continue same med as prescribed

## 2018-07-26 ENCOUNTER — Ambulatory Visit (INDEPENDENT_AMBULATORY_CARE_PROVIDER_SITE_OTHER): Payer: Medicare Other

## 2018-07-26 DIAGNOSIS — I4891 Unspecified atrial fibrillation: Secondary | ICD-10-CM

## 2018-07-26 LAB — PROTIME-INR: INR: 2.3 — AB (ref 0.9–1.1)

## 2018-07-26 NOTE — Progress Notes (Signed)
Pt Inr was 2.3 continue same medication

## 2018-08-02 ENCOUNTER — Emergency Department: Payer: Medicare Other

## 2018-08-02 ENCOUNTER — Ambulatory Visit (INDEPENDENT_AMBULATORY_CARE_PROVIDER_SITE_OTHER): Payer: Medicare Other | Admitting: Internal Medicine

## 2018-08-02 ENCOUNTER — Inpatient Hospital Stay
Admission: EM | Admit: 2018-08-02 | Discharge: 2018-08-05 | DRG: 291 | Disposition: A | Discharge status: Home Health | Payer: Medicare Other | Attending: Internal Medicine | Admitting: Internal Medicine

## 2018-08-02 ENCOUNTER — Encounter: Payer: Self-pay | Admitting: Internal Medicine

## 2018-08-02 ENCOUNTER — Other Ambulatory Visit: Payer: Self-pay

## 2018-08-02 VITALS — BP 140/80 | HR 93 | Resp 16 | Ht 62.0 in | Wt 201.0 lb

## 2018-08-02 DIAGNOSIS — Z952 Presence of prosthetic heart valve: Secondary | ICD-10-CM | POA: Diagnosis not present

## 2018-08-02 DIAGNOSIS — X58XXXA Exposure to other specified factors, initial encounter: Secondary | ICD-10-CM | POA: Diagnosis present

## 2018-08-02 DIAGNOSIS — R0602 Shortness of breath: Secondary | ICD-10-CM

## 2018-08-02 DIAGNOSIS — I5023 Acute on chronic systolic (congestive) heart failure: Secondary | ICD-10-CM | POA: Diagnosis present

## 2018-08-02 DIAGNOSIS — Z79899 Other long term (current) drug therapy: Secondary | ICD-10-CM

## 2018-08-02 DIAGNOSIS — N179 Acute kidney failure, unspecified: Secondary | ICD-10-CM | POA: Diagnosis present

## 2018-08-02 DIAGNOSIS — I4891 Unspecified atrial fibrillation: Secondary | ICD-10-CM | POA: Diagnosis not present

## 2018-08-02 DIAGNOSIS — S80822A Blister (nonthermal), left lower leg, initial encounter: Secondary | ICD-10-CM | POA: Diagnosis present

## 2018-08-02 DIAGNOSIS — E119 Type 2 diabetes mellitus without complications: Secondary | ICD-10-CM | POA: Diagnosis present

## 2018-08-02 DIAGNOSIS — I429 Cardiomyopathy, unspecified: Secondary | ICD-10-CM | POA: Diagnosis not present

## 2018-08-02 DIAGNOSIS — Z9981 Dependence on supplemental oxygen: Secondary | ICD-10-CM

## 2018-08-02 DIAGNOSIS — J449 Chronic obstructive pulmonary disease, unspecified: Secondary | ICD-10-CM | POA: Diagnosis present

## 2018-08-02 DIAGNOSIS — I509 Heart failure, unspecified: Secondary | ICD-10-CM | POA: Diagnosis not present

## 2018-08-02 DIAGNOSIS — J9601 Acute respiratory failure with hypoxia: Secondary | ICD-10-CM | POA: Diagnosis present

## 2018-08-02 DIAGNOSIS — J4489 Other specified chronic obstructive pulmonary disease: Secondary | ICD-10-CM

## 2018-08-02 DIAGNOSIS — Z7901 Long term (current) use of anticoagulants: Secondary | ICD-10-CM

## 2018-08-02 DIAGNOSIS — R600 Localized edema: Secondary | ICD-10-CM | POA: Diagnosis not present

## 2018-08-02 DIAGNOSIS — I110 Hypertensive heart disease with heart failure: Principal | ICD-10-CM | POA: Diagnosis present

## 2018-08-02 DIAGNOSIS — I5032 Chronic diastolic (congestive) heart failure: Secondary | ICD-10-CM | POA: Diagnosis not present

## 2018-08-02 DIAGNOSIS — Z7982 Long term (current) use of aspirin: Secondary | ICD-10-CM

## 2018-08-02 DIAGNOSIS — Z950 Presence of cardiac pacemaker: Secondary | ICD-10-CM

## 2018-08-02 DIAGNOSIS — Z794 Long term (current) use of insulin: Secondary | ICD-10-CM

## 2018-08-02 DIAGNOSIS — J9621 Acute and chronic respiratory failure with hypoxia: Secondary | ICD-10-CM | POA: Diagnosis not present

## 2018-08-02 DIAGNOSIS — I428 Other cardiomyopathies: Secondary | ICD-10-CM | POA: Diagnosis not present

## 2018-08-02 DIAGNOSIS — R0902 Hypoxemia: Secondary | ICD-10-CM | POA: Diagnosis not present

## 2018-08-02 DIAGNOSIS — I442 Atrioventricular block, complete: Secondary | ICD-10-CM | POA: Diagnosis present

## 2018-08-02 DIAGNOSIS — I10 Essential (primary) hypertension: Secondary | ICD-10-CM | POA: Diagnosis not present

## 2018-08-02 DIAGNOSIS — I248 Other forms of acute ischemic heart disease: Secondary | ICD-10-CM | POA: Diagnosis present

## 2018-08-02 HISTORY — DX: Chronic obstructive pulmonary disease, unspecified: J44.9

## 2018-08-02 LAB — CBC
HCT: 40.6 % (ref 36.0–46.0)
Hemoglobin: 12.2 g/dL (ref 12.0–15.0)
MCH: 25.8 pg — ABNORMAL LOW (ref 26.0–34.0)
MCHC: 30 g/dL (ref 30.0–36.0)
MCV: 86 fL (ref 80.0–100.0)
Platelets: 254 10*3/uL (ref 150–400)
RBC: 4.72 MIL/uL (ref 3.87–5.11)
RDW: 15.6 % — ABNORMAL HIGH (ref 11.5–15.5)
WBC: 7.8 10*3/uL (ref 4.0–10.5)
nRBC: 0 % (ref 0.0–0.2)

## 2018-08-02 LAB — BASIC METABOLIC PANEL
ANION GAP: 10 (ref 5–15)
BUN: 20 mg/dL (ref 8–23)
CALCIUM: 9.1 mg/dL (ref 8.9–10.3)
CO2: 27 mmol/L (ref 22–32)
Chloride: 101 mmol/L (ref 98–111)
Creatinine, Ser: 0.94 mg/dL (ref 0.44–1.00)
GFR calc non Af Amer: 55 mL/min — ABNORMAL LOW (ref >60)
Glucose, Bld: 162 mg/dL — ABNORMAL HIGH (ref 70–99)
Potassium: 4 mmol/L (ref 3.5–5.1)
Sodium: 138 mmol/L (ref 135–145)

## 2018-08-02 LAB — INFLUENZA PANEL BY PCR (TYPE A & B)
Influenza A By PCR: NEGATIVE
Influenza B By PCR: NEGATIVE

## 2018-08-02 LAB — TROPONIN I: Troponin I: 0.07 ng/mL (ref <0.03)

## 2018-08-02 LAB — BRAIN NATRIURETIC PEPTIDE: B Natriuretic Peptide: 337 pg/mL — ABNORMAL HIGH (ref 0.0–100.0)

## 2018-08-02 LAB — GLUCOSE, CAPILLARY
Glucose-Capillary: 169 mg/dL — ABNORMAL HIGH (ref 70–99)
Glucose-Capillary: 136 mg/dL — ABNORMAL HIGH (ref 70–99)

## 2018-08-02 MED ORDER — ORAL CARE MOUTH RINSE
15.0000 mL | Freq: Two times a day (BID) | OROMUCOSAL | Status: DC
  Administered 2018-08-02 – 2018-08-05 (×4): 15 mL via OROMUCOSAL

## 2018-08-02 MED ORDER — SODIUM CHLORIDE 0.9 % IV SOLN: 250.0000 mL | INTRAVENOUS | Status: DC | PRN

## 2018-08-02 MED ORDER — HYDRALAZINE HCL 25 MG PO TABS
37.5000 mg | ORAL_TABLET | Freq: Three times a day (TID) | ORAL | Status: DC
  Administered 2018-08-02 – 2018-08-04 (×5): 37.5 mg via ORAL
  Filled 2018-08-02 (×6): qty 2

## 2018-08-02 MED ORDER — SODIUM CHLORIDE 0.9% FLUSH
3.0000 mL | INTRAVENOUS | Status: DC | PRN
  Administered 2018-08-02: 3 mL via INTRAVENOUS
  Filled 2018-08-02: qty 3

## 2018-08-02 MED ORDER — INSULIN ASPART 100 UNIT/ML ~~LOC~~ SOLN
0.0000 [IU] | Freq: Three times a day (TID) | SUBCUTANEOUS | Status: DC
  Administered 2018-08-02: 2 [IU] via SUBCUTANEOUS
  Administered 2018-08-03 (×2): 1 [IU] via SUBCUTANEOUS
  Administered 2018-08-04: 2 [IU] via SUBCUTANEOUS
  Administered 2018-08-04 (×2): 3 [IU] via SUBCUTANEOUS
  Administered 2018-08-05: 5 [IU] via SUBCUTANEOUS
  Administered 2018-08-05: 2 [IU] via SUBCUTANEOUS
  Filled 2018-08-02 (×8): qty 1

## 2018-08-02 MED ORDER — LISINOPRIL 20 MG PO TABS
20.0000 mg | ORAL_TABLET | Freq: Every day | ORAL | Status: DC
  Administered 2018-08-02 – 2018-08-04 (×3): 20 mg via ORAL
  Filled 2018-08-02 (×3): qty 1

## 2018-08-02 MED ORDER — DILTIAZEM HCL ER COATED BEADS 240 MG PO CP24
240.0000 mg | ORAL_CAPSULE | Freq: Every day | ORAL | Status: DC
  Administered 2018-08-02 – 2018-08-05 (×4): 240 mg via ORAL
  Filled 2018-08-02: qty 1
  Filled 2018-08-02: qty 2
  Filled 2018-08-02: qty 1
  Filled 2018-08-02: qty 2
  Filled 2018-08-02 (×2): qty 1
  Filled 2018-08-02 (×2): qty 2

## 2018-08-02 MED ORDER — ALBUTEROL SULFATE (2.5 MG/3ML) 0.083% IN NEBU
5.0000 mg | INHALATION_SOLUTION | Freq: Once | RESPIRATORY_TRACT | Status: AC
  Administered 2018-08-02: 5 mg via RESPIRATORY_TRACT
  Filled 2018-08-02: qty 6

## 2018-08-02 MED ORDER — NITROGLYCERIN 0.4 MG SL SUBL: 0.4000 mg | SUBLINGUAL_TABLET | SUBLINGUAL | Status: DC | PRN

## 2018-08-02 MED ORDER — CARVEDILOL 25 MG PO TABS
25.0000 mg | ORAL_TABLET | Freq: Two times a day (BID) | ORAL | Status: DC
  Administered 2018-08-02 – 2018-08-05 (×4): 25 mg via ORAL
  Filled 2018-08-02 (×4): qty 1

## 2018-08-02 MED ORDER — PRAVASTATIN SODIUM 40 MG PO TABS
40.0000 mg | ORAL_TABLET | Freq: Every day | ORAL | Status: DC
  Administered 2018-08-02 – 2018-08-05 (×4): 40 mg via ORAL
  Filled 2018-08-02 (×4): qty 1

## 2018-08-02 MED ORDER — LISINOPRIL-HYDROCHLOROTHIAZIDE 20-12.5 MG PO TABS: 1.0000 | ORAL_TABLET | Freq: Every day | ORAL | Status: DC

## 2018-08-02 MED ORDER — INSULIN ASPART PROT & ASPART (70-30 MIX) 100 UNIT/ML ~~LOC~~ SUSP
30.0000 [IU] | Freq: Two times a day (BID) | SUBCUTANEOUS | Status: DC
  Administered 2018-08-02 – 2018-08-03 (×2): 30 [IU] via SUBCUTANEOUS
  Filled 2018-08-02 (×2): qty 10

## 2018-08-02 MED ORDER — FUROSEMIDE 10 MG/ML IJ SOLN
80.0000 mg | Freq: Once | INTRAMUSCULAR | Status: AC
  Administered 2018-08-02: 80 mg via INTRAVENOUS
  Filled 2018-08-02: qty 8

## 2018-08-02 MED ORDER — ASPIRIN 81 MG PO CHEW
81.0000 mg | CHEWABLE_TABLET | Freq: Every day | ORAL | Status: DC
  Administered 2018-08-02 – 2018-08-05 (×4): 81 mg via ORAL
  Filled 2018-08-02 (×4): qty 1

## 2018-08-02 MED ORDER — HYDROCHLOROTHIAZIDE 12.5 MG PO CAPS
12.5000 mg | ORAL_CAPSULE | Freq: Every day | ORAL | Status: DC
  Administered 2018-08-02 – 2018-08-04 (×3): 12.5 mg via ORAL
  Filled 2018-08-02 (×3): qty 1

## 2018-08-02 MED ORDER — FUROSEMIDE 10 MG/ML IJ SOLN
40.0000 mg | Freq: Two times a day (BID) | INTRAMUSCULAR | Status: DC
  Administered 2018-08-02 – 2018-08-04 (×4): 40 mg via INTRAVENOUS
  Filled 2018-08-02 (×4): qty 4

## 2018-08-02 MED ORDER — ATORVASTATIN CALCIUM 10 MG PO TABS
10.0000 mg | ORAL_TABLET | Freq: Every day | ORAL | Status: DC
  Administered 2018-08-02 – 2018-08-04 (×3): 10 mg via ORAL
  Filled 2018-08-02 (×3): qty 1

## 2018-08-02 MED ORDER — ONDANSETRON HCL 4 MG/2ML IJ SOLN: 4.0000 mg | Freq: Four times a day (QID) | INTRAMUSCULAR | Status: DC | PRN

## 2018-08-02 MED ORDER — TIOTROPIUM BROMIDE MONOHYDRATE 18 MCG IN CAPS
18.0000 ug | ORAL_CAPSULE | Freq: Every day | RESPIRATORY_TRACT | Status: DC
  Administered 2018-08-03 – 2018-08-05 (×3): 18 ug via RESPIRATORY_TRACT
  Filled 2018-08-02: qty 5

## 2018-08-02 MED ORDER — SODIUM CHLORIDE 0.9% FLUSH
3.0000 mL | Freq: Two times a day (BID) | INTRAVENOUS | Status: DC
  Administered 2018-08-02 – 2018-08-05 (×6): 3 mL via INTRAVENOUS

## 2018-08-02 MED ORDER — ACETAMINOPHEN 325 MG PO TABS
650.0000 mg | ORAL_TABLET | ORAL | Status: DC | PRN
  Administered 2018-08-02: 650 mg via ORAL
  Filled 2018-08-02: qty 2

## 2018-08-02 NOTE — ED Triage Notes (Signed)
Pt in via Ems from home. When pt visiting primary doc today told to go to ED for CXR as pt SOB. 80% on RA per EMS. 99% on 4L O2. Edema noted. Hist CHF/COPD/DM2/pacemaker. BP 202/144 per EMS. L hand 20g IV placed by Ems. Pt states she has not taken her medicine yet for today.

## 2018-08-02 NOTE — ED Notes (Signed)
Pt adamant she wants to sit up in chair. Room chair moved to side of bed and pt pivoted to chair. Pt resting comfortably.

## 2018-08-02 NOTE — ED Notes (Signed)
Bonnye Fava RN states will call this RN back in 68mins.

## 2018-08-02 NOTE — ED Notes (Signed)
EDP Siadecki notified of Trop 0.07 in person.

## 2018-08-02 NOTE — H&P (Signed)
[facility] NAME: Kimberly Peterson    MR#:  [id number]  DATE OF BIRTH:  December 09, 1929  DATE OF ADMISSION:  08/02/2018  PRIMARY CARE PHYSICIAN: [name], MD   REQUESTING/REFERRING PHYSICIAN: Dr Cherylann Banas  CHIEF COMPLAINT:   increasing shortness of breath and leg swelling for last few days.  HISTORY OF PRESENT ILLNESS:  Kimberly Peterson  is a 85 y.o. female with a known history of COPD, diabetes, hypertension and CHF systolic chronic comes to the emergency room from PCPs office after she started noticing increasing shortness of breath and leg swelling to the point she developed blisters and which burst with clear serial segment is fluid discharge. Patient is a poor historian. Daughter is present in the room tells me that her legs have been swollen she is had poor appetite whenever she gains fluid. She was noted to be an acute on chronic congestive heart failure systolic. Patient received IV Lasix 80 mg in the ER she is being admitted for further evaluation managed  He uses 2 L of nasal cannula oxygen at home. Her stats were in the 80s on 3 L.   PAST MEDICAL HISTORY:   Past Medical History:  Diagnosis Date  . CHF (congestive heart failure) ([place])   . COPD (chronic obstructive pulmonary disease) ([place])   . Diabetes mellitus without complication ([place])   . Hypertension     PAST SURGICAL HISTOIRY:   Past Surgical History:  Procedure Laterality Date  . aoric valve replacemet      st Jude  . CATARACT EXTRACTION, BILATERAL    . PACEMAKER PLACEMENT    . VALVE REPLACEMENT      SOCIAL HISTORY:   Social History   Tobacco Use  . Smoking status: Never Smoker  . Smokeless tobacco: Never Used  Substance Use Topics  . Alcohol use: No    FAMILY HISTORY:  History reviewed. No pertinent family history.  DRUG ALLERGIES:  No Known Allergies  REVIEW OF SYSTEMS:  Review of Systems  Constitutional: Negative for chills, fever and  weight loss.  HENT: Negative for ear discharge, ear pain and nosebleeds.   Eyes: Negative for blurred vision, pain and discharge.  Respiratory: Positive for shortness of breath. Negative for sputum production, wheezing and stridor.   Cardiovascular: Positive for leg swelling and PND. Negative for chest pain, palpitations and orthopnea.  Gastrointestinal: Negative for abdominal pain, diarrhea, nausea and vomiting.  Genitourinary: Negative for frequency and urgency.  Musculoskeletal: Negative for back pain and joint pain.  Neurological: Positive for weakness. Negative for sensory change, speech change and focal weakness.  Psychiatric/Behavioral: Negative for depression and hallucinations. The patient is not nervous/anxious.      MEDICATIONS AT HOME:   Prior to Admission medications   Medication Sig Start Date End Date Taking? Authorizing Provider  aspirin 81 MG tablet Take 81 mg by mouth daily.   Yes [provider]  atorvastatin (LIPITOR) 10 MG tablet TAKE 1 TABLET BY MOUTH ONCE DAILY FOR CHOLESTEROL 09/24/17  Yes Ronnell Freshwater, NP  Blood Glucose Monitoring Suppl (PRODIGY AUTOCODE BLOOD GLUCOSE) DEVI Use as directed 07/27/17  Yes [name], MD  carvedilol (COREG) 25 MG tablet TAKE ONE TABLET BY MOUTH TWICE DAILY 07/13/17  Yes Allyne Gee, MD  diltiazem (CARDIZEM CD) 240 MG 24 hr capsule TAKE 1 CAPSULE BY MOUTH ONCE DAILY 06/21/18  Yes Kendell Bane, NP  furosemide (LASIX) 40 MG tablet TAKE 1 TABLET BY  MOUTH ONCE DAILY FOR  FLUID.  MAY  TAKE  HALF  EXTRA  IF  NEEDED 11/03/17  Yes [name], MD  glucose blood (PRODIGY NO CODING BLOOD GLUC) test strip 1 each by Other route 3 (three) times daily. Use as instructed 07/27/17  Yes [name], MD  hydrALAZINE (APRESOLINE) 25 MG tablet TAKE 1 & 1/2 (ONE & ONE-HALF) TABLETS BY MOUTH THREE TIMES DAILY FOR BLOOD PRESSURE 06/21/18  Yes Scarboro, Audie Clear, NP  insulin aspart protamine- aspart (NOVOLOG MIX 70/30) (70-30) 100 UNIT/ML  injection Inject 26-34 Units into the skin 2 (two) times daily with a meal. 34 units in morning and 26 units in the evening   Yes [provider]  lisinopril-hydrochlorothiazide (PRINZIDE,ZESTORETIC) 20-12.5 MG tablet Take 1 tablet by mouth daily.  02/14/10  Yes [provider]  pantoprazole (PROTONIX) 40 MG tablet Take 1 tablet (40 mg total) by mouth daily. 06/09/18  Yes Scarboro, Audie Clear, NP  pravastatin (PRAVACHOL) 40 MG tablet Take 40 mg by mouth daily.  02/15/09  Yes [provider]  tiotropium (SPIRIVA) 18 MCG inhalation capsule Place 1 capsule (18 mcg total) into inhaler and inhale daily. 12/06/15  Yes Epifanio Lesches, MD  warfarin (COUMADIN) 5 MG tablet TAKE ONE TABLET BY MOUTH ONCE DAILY AT 5-6PM AND TAKE ONE AND ONE-HALF TABLET BY MOUTH ON THURSDAY Patient taking differently: Take half tab every night at 6 pm. 12/30/17  Yes Ronnell Freshwater, NP  OXYGEN Inhale into the lungs continuous.    [provider]      VITAL SIGNS:  Blood pressure (!) 209/68, pulse 78, temperature 97.8 F (36.6 C), temperature source Oral, resp. rate (!) 27, height 5\' 2"  (1.575 m), weight 96.6 kg, SpO2 100 %.  PHYSICAL EXAMINATION:  GENERAL:  85 y.o.-year-old patient lying in the bed with no acute distress.obese  EYES: Pupils equal, round, reactive to light and accommodation. No scleral icterus. Extraocular muscles intact.  HEENT: Head atraumatic, normocephalic. Oropharynx and nasopharynx clear.  NECK:  Supple, no jugular venous distention. No thyroid enlargement, no tenderness.  LUNGS: Coarse breath sounds bilaterally, no wheezing, rales,rhonchi or crepitation. No use of accessory muscles of respiration.  CARDIOVASCULAR: S1, S2 normal. No murmurs, rubs, or gallops.  ABDOMEN: Soft, nontender, nondistended. Bowel sounds present. No organomegaly or mass.  EXTREMITIES: ++ pedal edema -- is couple blisters in her left leg No  cyanosis, or clubbing.  NEUROLOGIC: Cranial nerves  II through XII are intact. Muscle strength 5/5 in all extremities. Sensation intact. Gait not checked.  PSYCHIATRIC: The patient is alert and oriented x 3.  SKIN: No obvious rash, lesion, or ulcer.   LABORATORY PANEL:   CBC Recent Labs  Lab 08/02/18 1503  WBC 7.8  HGB 12.2  HCT 40.6  PLT 254   ------------------------------------------------------------------------------------------------------------------  Chemistries  Recent Labs  Lab 08/02/18 1503  NA 138  K 4.0  CL 101  CO2 27  GLUCOSE 162*  BUN 20  CREATININE 0.94  CALCIUM 9.1   ------------------------------------------------------------------------------------------------------------------  Cardiac Enzymes Recent Labs  Lab 08/02/18 1503  TROPONINI 0.07*   ------------------------------------------------------------------------------------------------------------------  RADIOLOGY:  Dg Chest Portable 1 View  Result Date: 08/02/2018 CLINICAL DATA:  Shortness of breath.  Hypoxia.  COPD. EXAM: PORTABLE CHEST 1 VIEW COMPARISON:  12/03/2015 and 09/19/2015 FINDINGS: Cardiomegaly with mild pulmonary vascular congestion and slight bilateral interstitial accentuation. No discrete effusions. Pacemaker in place. Aortic valve replacement. Aortic atherosclerosis. IMPRESSION: Mild pulmonary vascular congestion and mild bilateral pulmonary edema. Mild cardiomegaly  even considering the portable technique. Aortic Atherosclerosis (ICD10-I70.0). Electronically Signed   By: Lorriane Shire M.D.   On: 08/02/2018 15:08    EKG:    IMPRESSION AND PLAN:    Kimberly Peterson  is a 85 y.o. female with a known history of COPD, diabetes, hypertension and CHF systolic chronic comes to the emergency room from PCPs office after she started noticing increasing shortness of breath and leg swelling  1. Acute on chronic congestive heart failure systolic -patient came in with increasing shortness of breath, leg edema with leg blisters and elevated  BNP and chest x-ray consistent with CHF. -Lasix 40 BID -cardiology consultation with Dr. Ubaldo Glassing -echocardiogram of the heart -continue Coreg, aspirin, hydralazine -input output, daily weight -consider heart failure clinic follow-up -care management for home health CHF program  2. Type II diabetes -sliding scale insulin and insulin 7030  3. Elevated troponin without chest pain consistent with demand ischemia in the setting of CHF -continue aspirin  4. DVT prophylaxis subcu Lovenox    All the records are reviewed and case discussed with ED provider.   CODE STATUS: full  TOTAL TIME TAKING CARE OF THIS PATIENT: *50* minutes.    Fritzi Mandes M.D on 08/02/2018 at 4:49 PM  Between 7am to 6pm - Pager - [phone]  After 6pm go to [email or web address] - password [email or web address] [facility]  [facility]  Office  [phone]  CC: Primary care physician; [name], MD

## 2018-08-02 NOTE — ED Notes (Signed)
Pt notified of bed 246; daughter called to obtain info about type of pacemaker to interrogate it. Daughter states she will find pt's wallet once home to look for PM card. Pt made aware of bed as well. Pt urinated--peri care provided.

## 2018-08-02 NOTE — Progress Notes (Signed)
Family Meeting Note  Advance Directive:NO  Today a meeting took place with the dter and pt patient came in with increasing shortness of breath leg edema and found to be in congestive heart failure. She has multiple comorbidities with hypertension, diabetes. Overall hemodynamically stable. Code status address patient is a full code.   Time spent during discussion:16 mins Fritzi Mandes, MD

## 2018-08-02 NOTE — ED Notes (Signed)
Lab will come draw blood as EMS line won't pull back enough.

## 2018-08-02 NOTE — ED Notes (Signed)
EKG completed

## 2018-08-02 NOTE — Patient Instructions (Signed)
Chest X-Ray  A chest X-ray is a painless test that uses radiation to create images of the structures inside of your chest. Chest X-rays are used to look for many health conditions, including heart failure, pneumonia, tuberculosis, rib fractures, breathing disorders, and cancer. They may be used to diagnose chest pain, constant coughing, or trouble breathing. Tell a health care provider about:  Any allergies you have.  All medicines you are taking, including vitamins, herbs, eye drops, creams, and over-the-counter medicines.  Any surgeries you have had.  Any medical conditions you have.  Whether you are pregnant or may be pregnant. What are the risks? Getting a chest X-ray is a safe procedure. However, you will be exposed to a small amount of radiation. Being exposed to too much radiation over a lifetime can increase the risk of cancer. This risk is small, but it may occur if you have many X-rays throughout your life. What happens before the procedure?  You may be asked to remove glasses, jewelry, and any other metal objects.  You will be asked to undress from the waist up. You may be given a hospital gown to wear.  You may be asked to wear a protective lead apron to protect parts of your body from radiation. What happens during the procedure?  You will be asked to stand still as each picture is taken to get the best possible images.  You will be asked to take a deep breath and hold your breath for a few seconds.  The X-ray machine will create a picture of your chest using a tiny burst of radiation. This is painless.  More pictures may be taken from other angles. Typically, one picture will be taken while you face the X-ray camera, and another picture will be taken from the side while you stand. If you cannot stand, you may be asked to lie down. The procedure may vary among health care providers and hospitals. What happens after the procedure?  The X-ray(s) will be reviewed by your  health care provider or an X-ray (radiology) specialist.  It is up to you to get your test results. Ask your health care provider, or the department that is doing the test, when your results will be ready.  Your health care provider will tell you if you need more tests or a follow-up exam. Keep all follow-up visits as told by your health care provider. This is important. Summary  A chest X-ray is a safe, painless test that is used to examine the inside of the chest, heart, and lungs.  You will need to undress from the waist up and remove jewelry and metal objects before the procedure.  You will be exposed to a small amount of radiation during the procedure.  The X-ray machine will take one or more pictures of your chest while you remain as still as possible.  Later, a health care provider or specialist will review the test results with you. This information is not intended to replace advice given to you by your health care provider. Make sure you discuss any questions you have with your health care provider. Document Released: 08/19/2016 Document Revised: 08/19/2016 Document Reviewed: 08/19/2016 Elsevier Interactive Patient Education  2019 Elsevier Inc.  

## 2018-08-02 NOTE — ED Notes (Signed)
Pt urinated on self again. Bed pads changed and peri care provided when/as pt could tolerate.

## 2018-08-02 NOTE — ED Notes (Signed)
Lab staff at bedside now.

## 2018-08-02 NOTE — Progress Notes (Signed)
Rt checked cpap from home , working and functioning properly,will notify [employer].Marland Kitchen

## 2018-08-02 NOTE — ED Notes (Signed)
Pt urinated all over self as she cannot tolerate lying flat long enough for this RN to place bedpan under her. Peri care provided & all linen changed as pt could tolerate.

## 2018-08-02 NOTE — ED Provider Notes (Signed)
[facility] Emergency Department Provider Note ____________________________________________   First MD Initiated Contact with Patient 08/02/18 1408     (approximate)  I have reviewed the triage vital signs and the nursing notes.   HISTORY  Chief Complaint Shortness of Breath    HPI Kimberly Peterson is a 85 y.o. female PMH as noted below who presents with shortness of breath, gradual onset over the course of the last day, and associated with worsening bilateral leg swelling.  She denies associated chest pain or fever.  She does have some nonproductive cough.  The patient was seen by her PMD and was told to go to the hospital for chest x-ray, but then apparently went home.  She was noted to be hypoxic to the high 80s on her normal 2 L by EMS  Past Medical History:  Diagnosis Date  . CHF (congestive heart failure) ([place])   . COPD (chronic obstructive pulmonary disease) ([place])   . Diabetes mellitus without complication ([place])   . Hypertension     Patient Active Problem List   Diagnosis Date Noted  . Uncontrolled type 2 diabetes mellitus with hyperglycemia ([place]) 02/25/2018  . Atrial fibrillation ([place]) 02/25/2018  . Encounter for therapeutic drug monitoring 02/25/2018  . Dependence on supplemental oxygen 02/25/2018  . Lower extremity edema 02/25/2018  . CHF (congestive heart failure) ([place]) 12/04/2015  . Diabetes mellitus ([place]) 12/04/2015  . Chronic obstructive pulmonary disease ([place]) 09/20/2015  . Acute on chronic respiratory failure with hypoxia ([place]) 09/19/2015  . Diastolic CHF, acute on chronic ([facility]) 09/19/2015  . Benign essential HTN 09/19/2015  . Controlled diabetes mellitus without complication, with long-term current use of insulin ([place]) 09/19/2015  . Hyperlipidemia, mixed 09/04/2014  . Pulmonary edema 09/04/2014  . Pulmonary embolism ([place]) 09/23/2013    Past Surgical History:  Procedure Laterality Date  . aoric valve replacemet      st Jude  .  CATARACT EXTRACTION, BILATERAL    . PACEMAKER PLACEMENT    . VALVE REPLACEMENT      Prior to Admission medications   Medication Sig Start Date End Date Taking? Authorizing Provider  aspirin 81 MG tablet Take 81 mg by mouth daily.    [provider]  atorvastatin (LIPITOR) 10 MG tablet TAKE 1 TABLET BY MOUTH ONCE DAILY FOR CHOLESTEROL 09/24/17   Ronnell Freshwater, NP  Blood Glucose Monitoring Suppl (PRODIGY AUTOCODE BLOOD GLUCOSE) DEVI Use as directed 07/27/17   [name], MD  carvedilol (COREG) 25 MG tablet TAKE ONE TABLET BY MOUTH TWICE DAILY 07/13/17   Allyne Gee, MD  diltiazem (CARDIZEM CD) 240 MG 24 hr capsule TAKE 1 CAPSULE BY MOUTH ONCE DAILY 06/21/18   Kendell Bane, NP  furosemide (LASIX) 40 MG tablet TAKE 1 TABLET BY MOUTH ONCE DAILY FOR  FLUID.  MAY  TAKE  HALF  EXTRA  IF  NEEDED 11/03/17   [name], MD  glucose blood (PRODIGY NO CODING BLOOD GLUC) test strip 1 each by Other route 3 (three) times daily. Use as instructed 07/27/17   [name], MD  hydrALAZINE (APRESOLINE) 25 MG tablet TAKE 1 & 1/2 (ONE & ONE-HALF) TABLETS BY MOUTH THREE TIMES DAILY FOR BLOOD PRESSURE 06/21/18   Kendell Bane, NP  insulin aspart protamine- aspart (NOVOLOG MIX 70/30) (70-30) 100 UNIT/ML injection Inject 26-34 Units into the skin 2 (two) times daily with a meal. 34 units in morning and 26 units in the evening    [provider]  lisinopril-hydrochlorothiazide (PRINZIDE,ZESTORETIC) 20-12.5 MG tablet Take by mouth. 02/14/10   [provider]  OXYGEN Inhale into the lungs continuous.    [provider]  pantoprazole (PROTONIX) 40 MG tablet Take 1 tablet (40 mg total) by mouth daily. 06/09/18   Kendell Bane, NP  pravastatin (PRAVACHOL) 40 MG tablet Take by mouth. 02/15/09   [provider]  tiotropium (SPIRIVA) 18 MCG inhalation capsule Place 1 capsule (18 mcg total) into inhaler and inhale daily. 12/06/15   Epifanio Lesches, MD  warfarin  (COUMADIN) 5 MG tablet TAKE ONE TABLET BY MOUTH ONCE DAILY AT 5-6PM AND TAKE ONE AND ONE-HALF TABLET BY MOUTH ON THURSDAY Patient taking differently: Take half tab every night at 6 pm. 12/30/17   Ronnell Freshwater, NP    Allergies Patient has no known allergies.  History reviewed. No pertinent family history.  Social History Social History   Tobacco Use  . Smoking status: Never Smoker  . Smokeless tobacco: Never Used  Substance Use Topics  . Alcohol use: No  . Drug use: No    Review of Systems  Constitutional: No fever. Eyes: No redness. ENT: No sore throat. Cardiovascular: Denies chest pain. Respiratory: Positive for shortness of breath. Gastrointestinal: No vomiting.  Genitourinary: Negative for dysuria.  Musculoskeletal: Negative for back pain. Skin: Negative for rash. Neurological: Negative for headache.   ____________________________________________   PHYSICAL EXAM:  VITAL SIGNS: ED Triage Vitals  Enc Vitals Group     BP 08/02/18 1412 (!) 178/91     Pulse --      Resp 08/02/18 1415 (!) 23     Temp 08/02/18 1424 97.8 F (36.6 C)     Temp Source 08/02/18 1424 Oral     SpO2 08/02/18 1440 99 %     Weight 08/02/18 1428 213 lb (96.6 kg)     Height 08/02/18 1428 5\' 2"  (1.575 m)     Head Circumference --      Peak Flow --      Pain Score 08/02/18 1425 0     Pain Loc --      Pain Edu? --      Excl. in [place]? --     Constitutional: Alert and oriented.  Somewhat chronically ill-appearing but in no acute distress. Eyes: Conjunctivae are normal.  Head: Atraumatic. Nose: No congestion/rhinnorhea. Mouth/Throat: Mucous membranes are moist.   Neck: Normal range of motion.  Cardiovascular: Normal rate, regular rhythm. Grossly normal heart sounds.  Good peripheral circulation. Respiratory: Increased respiratory effort.  Decreased breath sounds bilaterally with some rhonchi. Gastrointestinal: Soft and nontender.  Mild distention.  Genitourinary: No flank  tenderness. Musculoskeletal: 2+ bilateral lower extremity edema with some weeping and blisters.  Extremities warm and well perfused.  Neurologic:  Normal speech and language. No gross focal neurologic deficits are appreciated.  Skin:  Skin is warm and dry. No rash noted.  No erythema or induration to the lower extremities. Psychiatric: Mood and affect are normal. Speech and behavior are normal.  ____________________________________________   LABS (all labs ordered are listed, but only abnormal results are displayed)  Labs Reviewed  BASIC METABOLIC PANEL - Abnormal; Notable for the following components:      Result Value   Glucose, Bld 162 (*)    GFR calc non Af Amer 55 (*)    All other components within normal limits  CBC - Abnormal; Notable for the following components:   MCH 25.8 (*)    RDW 15.6 (*)  All other components within normal limits  TROPONIN I - Abnormal; Notable for the following components:   Troponin I 0.07 (*)    All other components within normal limits  BRAIN NATRIURETIC PEPTIDE - Abnormal; Notable for the following components:   B Natriuretic Peptide 337.0 (*)    All other components within normal limits  INFLUENZA PANEL BY PCR (TYPE A & B)   ____________________________________________  EKG  ED ECG REPORT I, Arta Silence, the attending physician, personally viewed and interpreted this ECG.  Date: 08/02/2018 EKG Time: 1417 Rate: 82 Rhythm: Ventricular paced rhythm ST/T Wave abnormalities: normal Narrative Interpretation: no evidence of acute ischemia  ____________________________________________  RADIOLOGY  CXR: Pulmonary vascular congestion and edema  ____________________________________________   PROCEDURES  Procedure(s) performed: No  Procedures  Critical Care performed: Yes  CRITICAL CARE Performed by: Arta Silence   Total critical care time: 30 minutes  Critical care time was exclusive of separately billable  procedures and treating other patients.  Critical care was necessary to treat or prevent imminent or life-threatening deterioration.  Critical care was time spent personally by me on the following activities: development of treatment plan with patient and/or surrogate as well as nursing, discussions with consultants, evaluation of patient's response to treatment, examination of patient, obtaining history from patient or surrogate, ordering and performing treatments and interventions, ordering and review of laboratory studies, ordering and review of radiographic studies, pulse oximetry and re-evaluation of patient's condition. ____________________________________________   INITIAL IMPRESSION / ASSESSMENT AND PLAN / ED COURSE  Pertinent labs & imaging results that were available during my care of the patient were reviewed by me and considered in my medical decision making (see chart for details).  85 year old female with PMH as noted above including history of CHF presents with worsening shortness of breath over the last 1 to 2 days with some nonproductive cough and lower extremity edema with weeping and blisters.  On exam the patient is hypertensive and tachypneic.  She is afebrile.  The remainder of the exam is as described above.  Overall presentation is most consistent with CHF but the differential also includes influenza or other viral bronchitis, pneumonia, or less likely other cardiac etiology.  We will obtain chest x-ray, lab work-up, give IV Lasix, and reassess.  Given the patient's hypoxia even on her normal home O2, I anticipate she will need admission.  ----------------------------------------- 4:29 PM on 08/02/2018 -----------------------------------------  Influenza is negative.  The lab work-up is consistent with CHF exacerbation.  Patient is feeling somewhat better after the Lasix.  Given her hypoxia previously I will admit.  I signed the patient out to the hospitalist Dr.  Posey Pronto.  ____________________________________________   FINAL CLINICAL IMPRESSION(S) / ED DIAGNOSES  Final diagnoses:  Acute on chronic congestive heart failure, unspecified heart failure type ([place])  Acute respiratory failure with hypoxia ([place])      NEW MEDICATIONS STARTED DURING THIS VISIT:  New Prescriptions   No medications on file     Note:  This document was prepared using Dragon voice recognition software and may include unintentional dictation errors.    Arta Silence, MD 08/02/18 [phone]

## 2018-08-02 NOTE — Progress Notes (Signed)
[facility] [place],  [address]  Pulmonary Sleep Medicine   Office Visit Note  Patient Name: Kimberly Peterson DOB: 06-Aug-1929 MRN [id number]  Date of Service: 08/02/2018  Complaints/HPI: Pace maker SOB on 2lpm increased to 3lpm.  Patient has basically been having a decline over the course of the last several months.  Patient continues to be on oxygen therapy she had significant desaturations on 2 L so therefore the nurse increased her oxygen to 3 L which did resolve the desaturation issue.  It appears according to the daughter that the patient has not been very active.  She also has not been very interested in doing any kind of diagnostic testing for evaluation of her worsening shortness of breath.  She has a history of cardiac issues as well as pulmonary issues.  I did recommend that she get a chest x-ray today and she was reluctant to go for the test.  After discussion she did finally agree to go and I have just reviewed the results which show that she has mild pulmonary edema.  She as mentioned is supposed to see her cardiologist but has not been doing so.  I did make a referral back to Dr. [name] who is her primary cardiologist  ROS  General: (-) fever, (-) chills, (-) night sweats, (-) weakness Skin: (-) rashes, (-) itching,. Eyes: (-) visual changes, (-) redness, (-) itching. Nose and Sinuses: (-) nasal stuffiness or itchiness, (-) postnasal drip, (-) nosebleeds, (-) sinus trouble. Mouth and Throat: (-) sore throat, (-) hoarseness. Neck: (-) swollen glands, (-) enlarged thyroid, (-) neck pain. Respiratory: - cough, (-) bloody sputum, + shortness of breath, - wheezing. Cardiovascular: + ankle swelling, (-) chest pain. Lymphatic: (-) lymph node enlargement. Neurologic: (-) numbness, (-) tingling. Psychiatric: (-) anxiety, (-) depression   Current Medication: Outpatient Encounter Medications as of 08/02/2018  Medication Sig Note  . aspirin 81 MG  tablet Take 81 mg by mouth daily.   Marland Kitchen atorvastatin (LIPITOR) 10 MG tablet TAKE 1 TABLET BY MOUTH ONCE DAILY FOR CHOLESTEROL   . Blood Glucose Monitoring Suppl (PRODIGY AUTOCODE BLOOD GLUCOSE) DEVI Use as directed   . carvedilol (COREG) 25 MG tablet TAKE ONE TABLET BY MOUTH TWICE DAILY   . diltiazem (CARDIZEM CD) 240 MG 24 hr capsule TAKE 1 CAPSULE BY MOUTH ONCE DAILY   . furosemide (LASIX) 40 MG tablet TAKE 1 TABLET BY MOUTH ONCE DAILY FOR  FLUID.  MAY  TAKE  HALF  EXTRA  IF  NEEDED   . glucose blood (PRODIGY NO CODING BLOOD GLUC) test strip 1 each by Other route 3 (three) times daily. Use as instructed   . hydrALAZINE (APRESOLINE) 25 MG tablet TAKE 1 & 1/2 (ONE & ONE-HALF) TABLETS BY MOUTH THREE TIMES DAILY FOR BLOOD PRESSURE   . insulin aspart protamine- aspart (NOVOLOG MIX 70/30) (70-30) 100 UNIT/ML injection Inject 26-34 Units into the skin 2 (two) times daily with a meal. 34 units in morning and 26 units in the evening 09/19/2015: Had night dose  . lisinopril-hydrochlorothiazide (PRINZIDE,ZESTORETIC) 20-12.5 MG tablet Take by mouth.   . OXYGEN Inhale into the lungs continuous.   . pantoprazole (PROTONIX) 40 MG tablet Take 1 tablet (40 mg total) by mouth daily.   . pravastatin (PRAVACHOL) 40 MG tablet Take by mouth.   . tiotropium (SPIRIVA) 18 MCG inhalation capsule Place 1 capsule (18 mcg total) into inhaler and inhale daily.   Marland Kitchen warfarin (COUMADIN) 5 MG tablet TAKE ONE TABLET  BY MOUTH ONCE DAILY AT 5-6PM AND TAKE ONE AND ONE-HALF TABLET BY MOUTH ON THURSDAY (Patient taking differently: Take half tab every night at 6 pm.)    No facility-administered encounter medications on file as of 08/02/2018.     Surgical History: Past Surgical History:  Procedure Laterality Date  . aoric valve replacemet      [employer] Jude  . CATARACT EXTRACTION, BILATERAL    . PACEMAKER PLACEMENT    . VALVE REPLACEMENT      Medical History: Past Medical History:  Diagnosis Date  . Diabetes mellitus without  complication ([place])   . Hypertension     Family History: History reviewed. No pertinent family history.  Social History: Social History   Socioeconomic History  . Marital status: Married    Spouse name: Not on file  . Number of children: Not on file  . Years of education: Not on file  . Highest education level: Not on file  Occupational History  . Not on file  Social Needs  . Financial resource strain: Not on file  . Food insecurity:    Worry: Not on file    Inability: Not on file  . Transportation needs:    Medical: Not on file    Non-medical: Not on file  Tobacco Use  . Smoking status: Never Smoker  . Smokeless tobacco: Never Used  Substance and Sexual Activity  . Alcohol use: No  . Drug use: No  . Sexual activity: Never  Lifestyle  . Physical activity:    Days per week: Not on file    Minutes per session: Not on file  . Stress: Not on file  Relationships  . Social connections:    Talks on phone: Not on file    Gets together: Not on file    Attends religious service: Not on file    Active member of club or organization: Not on file    Attends meetings of clubs or organizations: Not on file    Relationship status: Not on file  . Intimate partner violence:    Fear of current or ex partner: Not on file    Emotionally abused: Not on file    Physically abused: Not on file    Forced sexual activity: Not on file  Other Topics Concern  . Not on file  Social History Narrative  . Not on file    Vital Signs: Blood pressure 140/80, pulse 93, resp. rate 16, height 5\' 2"  (1.575 m), weight 201 lb (91.2 kg), SpO2 91 %.  Examination: General Appearance: The patient is well-developed, well-nourished, and in no distress. Skin: Gross inspection of skin unremarkable. Head: normocephalic, no gross deformities. Eyes: no gross deformities noted. ENT: ears appear grossly normal no exudates. Neck: Supple. No thyromegaly. No LAD. Respiratory: few rhonchi  noted. Cardiovascular: Normal S1 and S2 without murmur or rub. Extremities: No cyanosis. pulses are equal. Neurologic: Alert and oriented. No involuntary movements.  LABS: Recent Results (from the past 2160 hour(s))  POCT INR     Status: Abnormal   Collection Time: 06/02/18 11:32 AM  Result Value Ref Range   INR 5.0 (A) 2.0 - 3.0    Comment: pt 60.0 inr 5.0  POCT HgB A1C     Status: None   Collection Time: 06/02/18 11:54 AM  Result Value Ref Range   Hemoglobin A1C 5.6 4.0 - 5.6 %   HbA1c POC (<> result, manual entry)     HbA1c, POC (prediabetic range)  HbA1c, POC (controlled diabetic range)    POCT INR     Status: Abnormal   Collection Time: 06/09/18  2:33 PM  Result Value Ref Range   INR 1.2 (A) 2.0 - 3.0    Comment: PT 14.3, take a half tab every night   UA/M w/rflx Culture, Routine     Status: None   Collection Time: 06/09/18  3:02 PM  Result Value Ref Range   Specific Gravity, UA 1.007 1.005 - 1.030   pH, UA 7.5 5.0 - 7.5   Color, UA Yellow Yellow   Appearance Ur Clear Clear   Leukocytes, UA Negative Negative   Protein, UA Negative Negative/Trace   Glucose, UA Negative Negative   Ketones, UA Negative Negative   RBC, UA Negative Negative   Bilirubin, UA Negative Negative   Urobilinogen, Ur 1.0 0.2 - 1.0 mg/dL   Nitrite, UA Negative Negative   Microscopic Examination Comment     Comment: Microscopic follows if indicated.   Microscopic Examination See below:     Comment: Microscopic was indicated and was performed.   Urinalysis Reflex Comment     Comment: This specimen will not reflex to a Urine Culture.  Microscopic Examination     Status: Abnormal   Collection Time: 06/09/18  3:02 PM  Result Value Ref Range   WBC, UA 0-5 0 - 5 /hpf   RBC, UA 0-2 0 - 2 /hpf   Epithelial Cells (non renal) 0-10 0 - 10 /hpf   Casts Present (A) None seen /lpf   Cast Type Hyaline casts N/A   Mucus, UA Present Not Estab.   Bacteria, UA None seen None seen/Few  Protime-INR      Status: Abnormal   Collection Time: 06/23/18 12:00 AM  Result Value Ref Range   INR 1.4 (A) 0.9 - 1.1  Protime-INR     Status: None   Collection Time: 06/28/18 12:00 AM  Result Value Ref Range   INR 1.1 0.9 - 1.1  Protime-INR     Status: Abnormal   Collection Time: 07/05/18 12:00 AM  Result Value Ref Range   INR 1.6 (A) 0.9 - 1.1    Radiology: Dg Chest Portable 1 View  Result Date: 12/03/2015 CLINICAL DATA:  Acute onset of respiratory distress. Initial encounter. EXAM: PORTABLE CHEST 1 VIEW COMPARISON:  Chest radiograph performed 09/19/2015 FINDINGS: The lungs are well-aerated. Vascular congestion is noted. Mild bibasilar opacities may reflect mild interstitial edema. There is no evidence of pleural effusion or pneumothorax. The cardiomediastinal silhouette is enlarged. The patient is status post median sternotomy. A pacemaker is noted overlying the left chest wall, with leads ending overlying the right atrium and right ventricle. No acute osseous abnormalities are seen. IMPRESSION: Vascular congestion and cardiomegaly. Mild bibasilar airspace opacities may reflect mild interstitial edema. Electronically Signed   By: Garald Balding M.D.   On: 12/03/2015 22:11    No results found.  No results found.    Assessment and Plan: Patient Active Problem List   Diagnosis Date Noted  . Uncontrolled type 2 diabetes mellitus with hyperglycemia ([place]) 02/25/2018  . Atrial fibrillation ([place]) 02/25/2018  . Encounter for therapeutic drug monitoring 02/25/2018  . Dependence on supplemental oxygen 02/25/2018  . Lower extremity edema 02/25/2018  . CHF (congestive heart failure) ([place]) 12/04/2015  . Diabetes mellitus ([place]) 12/04/2015  . Chronic obstructive pulmonary disease ([place]) 09/20/2015  . Acute on chronic respiratory failure with hypoxia ([place]) 09/19/2015  . Diastolic CHF, acute on chronic ([facility]) 09/19/2015  .  Benign essential HTN 09/19/2015  . Controlled diabetes mellitus without complication,  with long-term current use of insulin ([place]) 09/19/2015  . Hyperlipidemia, mixed 09/04/2014  . Pulmonary edema 09/04/2014  . Pulmonary embolism ([place]) 09/23/2013    1. COPD slowly progressing. She is not able to much activity anymore. She was desaturating on 2lpm so was increased to 3lpm this has helpd. Will also get a CXR.  This was done and it showed that she does have pulmonary edema 2. CHF diastolic the chest film showed that she had pulmonary edema which was mild probably contributing to her shortness of breath.  As noted I did recommend that she go to her primary cardiologist to get further work-up she has not seen the cardiologist for quite some time. 3. Atrial Fibrillation needs to see Dr [name] for the atrial fibrillation as well as the congestive heart failure 4. SOB multifactorial likely due to the pulmonary disease but also she does have CHF and may in fact be going in and out of atrial fibrillation I think she does need to go see her cardiologist as has been recommended above  General Counseling: I have discussed the findings of the evaluation and examination with Mc.  I have also discussed any further diagnostic evaluation thatmay be needed or ordered today. Aleja verbalizes understanding of the findings of todays visit. We also reviewed her medications today and discussed drug interactions and side effects including but not limited excessive drowsiness and altered mental states. We also discussed that there is always a risk not just to her but also people around her. she has been encouraged to call the office with any questions or concerns that should arise related to todays visit.    Time spent: 15 minutes  I have personally obtained a history, examined the patient, evaluated laboratory and imaging results, formulated the assessment and plan and placed orders.    Allyne Gee, MD [facility] Pulmonary and Critical Care Sleep medicine

## 2018-08-03 ENCOUNTER — Inpatient Hospital Stay
Admit: 2018-08-03 | Discharge: 2018-08-03 | Disposition: A | Discharge status: Home / Self Care | Payer: Medicare Other | Attending: Internal Medicine | Admitting: Internal Medicine

## 2018-08-03 DIAGNOSIS — I422 Other hypertrophic cardiomyopathy: Secondary | ICD-10-CM

## 2018-08-03 LAB — GLUCOSE, CAPILLARY
GLUCOSE-CAPILLARY: 134 mg/dL — AB (ref 70–99)
Glucose-Capillary: 114 mg/dL — ABNORMAL HIGH (ref 70–99)
Glucose-Capillary: 67 mg/dL — ABNORMAL LOW (ref 70–99)
Glucose-Capillary: 131 mg/dL — ABNORMAL HIGH (ref 70–99)
Glucose-Capillary: 182 mg/dL — ABNORMAL HIGH (ref 70–99)

## 2018-08-03 LAB — BASIC METABOLIC PANEL
Anion gap: 6 (ref 5–15)
BUN: 32 mg/dL — AB (ref 8–23)
CO2: 33 mmol/L — ABNORMAL HIGH (ref 22–32)
Calcium: 8.9 mg/dL (ref 8.9–10.3)
Chloride: 99 mmol/L (ref 98–111)
Creatinine, Ser: 1.43 mg/dL — ABNORMAL HIGH (ref 0.44–1.00)
GFR calc Af Amer: 38 mL/min — ABNORMAL LOW (ref >60)
GFR calc non Af Amer: 33 mL/min — ABNORMAL LOW (ref >60)
Glucose, Bld: 58 mg/dL — ABNORMAL LOW (ref 70–99)
Potassium: 3.8 mmol/L (ref 3.5–5.1)
Sodium: 138 mmol/L (ref 135–145)

## 2018-08-03 LAB — ECHOCARDIOGRAM COMPLETE
Height: 62 in
Weight: 3241.6 oz

## 2018-08-03 MED ORDER — PERFLUTREN LIPID MICROSPHERE
1.0000 mL | INTRAVENOUS | Status: AC | PRN
  Administered 2018-08-03: 3 mL via INTRAVENOUS
  Filled 2018-08-03: qty 10

## 2018-08-03 MED ORDER — INSULIN ASPART PROT & ASPART (70-30 MIX) 100 UNIT/ML ~~LOC~~ SUSP
15.0000 [IU] | Freq: Two times a day (BID) | SUBCUTANEOUS | Status: DC
  Administered 2018-08-03 – 2018-08-05 (×4): 15 [IU] via SUBCUTANEOUS
  Filled 2018-08-03 (×4): qty 10

## 2018-08-03 MED ORDER — ENOXAPARIN SODIUM 30 MG/0.3ML ~~LOC~~ SOLN
30.0000 mg | SUBCUTANEOUS | Status: DC
  Administered 2018-08-03 – 2018-08-04 (×2): 30 mg via SUBCUTANEOUS
  Filled 2018-08-03 (×2): qty 0.3

## 2018-08-03 NOTE — Progress Notes (Signed)
[facility] [place] NAME: Kimberly Peterson    MR#:  [id number]  DATE OF BIRTH:  02-Oct-1929  SUBJECTIVE:  CHIEF COMPLAINT:   Chief Complaint  Patient presents with  . Shortness of Breath  Patient seen and evaluated today Has shortness of breath Currently on oxygen via nasal cannula at 4 L Patient is on oxygen via nasal cannula at 2 L at home Has swelling in the lower extremities  REVIEW OF SYSTEMS:    ROS  CONSTITUTIONAL: No documented fever. No fatigue, weakness. No weight gain, no weight loss.  EYES: No blurry or double vision.  ENT: No tinnitus. No postnasal drip. No redness of the oropharynx.  RESPIRATORY: No cough, no wheeze, no hemoptysis. Has dyspnea.  CARDIOVASCULAR: No chest pain. No orthopnea. No palpitations. No syncope.  GASTROINTESTINAL: No nausea, no vomiting or diarrhea. No abdominal pain. No melena or hematochezia.  GENITOURINARY: No dysuria or hematuria.  ENDOCRINE: No polyuria or nocturia. No heat or cold intolerance.  HEMATOLOGY: No anemia. No bruising. No bleeding.  INTEGUMENTARY: No rashes. No lesions.  MUSCULOSKELETAL: No arthritis. Has swelling. No gout.  NEUROLOGIC: No numbness, tingling, or ataxia. No seizure-type activity.  PSYCHIATRIC: No anxiety. No insomnia. No ADD.   DRUG ALLERGIES:  No Known Allergies  VITALS:  Blood pressure 123/60, pulse 75, temperature 98 F (36.7 C), temperature source Oral, resp. rate 19, height 5\' 2"  (1.575 m), weight 91.9 kg, SpO2 95 %.  PHYSICAL EXAMINATION:   Physical Exam  GENERAL:  85 y.o.-year-old patient lying in the bed with no acute distress.  EYES: Pupils equal, round, reactive to light and accommodation. No scleral icterus. Extraocular muscles intact.  HEENT: Head atraumatic, normocephalic. Oropharynx and nasopharynx clear.  NECK:  Supple, no jugular venous distention. No thyroid enlargement, no tenderness.  LUNGS: decreased breath sounds bilaterally, bibasilar  crepitations heard. No use of accessory muscles of respiration.  CARDIOVASCULAR: S1, S2 normal. No murmurs, rubs, or gallops.  ABDOMEN: Soft, nontender, nondistended. Bowel sounds present. No organomegaly or mass.  EXTREMITIES: No cyanosis, clubbing  Bilateral edema b/l.    NEUROLOGIC: Cranial nerves II through XII are intact. No focal Motor or sensory deficits b/l.   PSYCHIATRIC: The patient is alert and oriented x 3.  SKIN: No obvious rash, lesion, or ulcer.   LABORATORY PANEL:   CBC Recent Labs  Lab 08/02/18 1503  WBC 7.8  HGB 12.2  HCT 40.6  PLT 254   ------------------------------------------------------------------------------------------------------------------ Chemistries  Recent Labs  Lab 08/03/18 0513  NA 138  K 3.8  CL 99  CO2 33*  GLUCOSE 58*  BUN 32*  CREATININE 1.43*  CALCIUM 8.9   ------------------------------------------------------------------------------------------------------------------  Cardiac Enzymes Recent Labs  Lab 08/02/18 1503  TROPONINI 0.07*   ------------------------------------------------------------------------------------------------------------------  RADIOLOGY:  Dg Chest Portable 1 View  Result Date: 08/02/2018 CLINICAL DATA:  Shortness of breath.  Hypoxia.  COPD. EXAM: PORTABLE CHEST 1 VIEW COMPARISON:  12/03/2015 and 09/19/2015 FINDINGS: Cardiomegaly with mild pulmonary vascular congestion and slight bilateral interstitial accentuation. No discrete effusions. Pacemaker in place. Aortic valve replacement. Aortic atherosclerosis. IMPRESSION: Mild pulmonary vascular congestion and mild bilateral pulmonary edema. Mild cardiomegaly even considering the portable technique. Aortic Atherosclerosis (ICD10-I70.0). Electronically Signed   By: Lorriane Shire M.D.   On: 08/02/2018 15:08     ASSESSMENT AND PLAN:  85 year old female patient with history of type 2 diabetes mellitus, hypertension, chronic systolic heart failure, COPD  currently under hospitalist service for swelling in the lower extremities and  difficulty breathing  -Acute on chronic systolic heart failure exacerbation Diuresis with IV Lasix Follow-up cardiology consult Follow-up echocardiogram Input output chart and daily body weight Heart failure clinic follow-up Continue beta-blocker and aspirin  -Elevated troponin secondary to demand ischemia from heart failure exacerbation  -Type 2 diabetes mellitus Diabetic diet with sliding scale coverage with insulin  -COPD stable  -DVT prophylaxis subcu Lovenox daily  All the records are reviewed and case discussed with Care Management/Social Worker. Management plans discussed with the patient, family and they are in agreement.  CODE STATUS: Full code  DVT Prophylaxis: SCDs  TOTAL TIME TAKING CARE OF THIS PATIENT: 36 minutes.   POSSIBLE D/C IN 2 to 3 DAYS, DEPENDING ON CLINICAL CONDITION.  Saundra Shelling M.D on 08/03/2018 at 1:10 PM  Between 7am to 6pm - Pager - [phone]  After 6pm go to [email or web address] - password [email or web address] [facility]  Office  [phone]  CC: Primary care physician; [name], MD  Note: This dictation was prepared with Dragon dictation along with smaller phrase technology. Any transcriptional errors that result from this process are unintentional.

## 2018-08-03 NOTE — Consult Note (Signed)
[facility] Cardiology  CARDIOLOGY CONSULT NOTE  Patient ID: Kimberly Peterson MRN: [id number] DOB/AGE: September 10, 1929 85 y.o.  Admit date: 08/02/2018 Referring Physician Dr. Fritzi Mandes Primary Physician Dr. Clayborn Bigness Primary Cardiologist Dr. [name]  Reason for Consultation Acute on chronic CHF   HPI: Kimberly Peterson is an 85 year old female with a past medical history significant for congestive heart failure, aortic valve disease s/p valve replacement at [facility] Jude, history of complete AV block s/p permanent pacemaker insertion, oxygen-dependent COPD, type 2 diabetes, and hypertension who presented to the ED on 08/02/2018 for a 3-4 day history of worsening shortness of breath and lower extremity swelling.  Was noted to have left lower extremity blisters which have been draining clear fluid.  Troponin x1 was elevated at .07 and chest x-ray revealed mild pulmonary vascular congestion and mild pulmonary edema.   Today, reports improvement in symptoms.  Currently on 3.5L of O2 and denies shortness of breath.  Baseline is 2L at home.  Lower extremity swelling has improved.  Denies chest pain or palpitations/heart racing sensation.   Follows up in outpatient cardiology with Dr. [name], but hasn't been seen in the clinic since 2016. Echocardiogram at the hospital in 2017 revealed mild LV dysfunction with an EF estimated between 40 and 45%.  Aortic valve prosthesis was not well-visualized.   Review of systems complete and found to be negative unless listed above     Past Medical History:  Diagnosis Date  . CHF (congestive heart failure) ([place])   . COPD (chronic obstructive pulmonary disease) ([place])   . Diabetes mellitus without complication ([place])   . Hypertension     Past Surgical History:  Procedure Laterality Date  . aoric valve replacemet      [facility]  . CATARACT EXTRACTION, BILATERAL    . PACEMAKER PLACEMENT    . VALVE REPLACEMENT      Medications Prior to Admission  Medication Sig Dispense Refill Last  Dose  . aspirin 81 MG tablet Take 81 mg by mouth daily.   08/01/2018 at 2000  . atorvastatin (LIPITOR) 10 MG tablet TAKE 1 TABLET BY MOUTH ONCE DAILY FOR CHOLESTEROL 90 tablet 3 08/01/2018 at 2000  . Blood Glucose Monitoring Suppl (PRODIGY AUTOCODE BLOOD GLUCOSE) DEVI Use as directed 1 each 0 Taking  . carvedilol (COREG) 25 MG tablet TAKE ONE TABLET BY MOUTH TWICE DAILY 180 tablet 4 08/01/2018 at 2000  . diltiazem (CARDIZEM CD) 240 MG 24 hr capsule TAKE 1 CAPSULE BY MOUTH ONCE DAILY 90 capsule 0 08/01/2018 at 0800  . furosemide (LASIX) 40 MG tablet TAKE 1 TABLET BY MOUTH ONCE DAILY FOR  FLUID.  MAY  TAKE  HALF  EXTRA  IF  NEEDED 120 tablet 4 08/01/2018 at 0800  . glucose blood (PRODIGY NO CODING BLOOD GLUC) test strip 1 each by Other route 3 (three) times daily. Use as instructed 100 each 12 Taking  . hydrALAZINE (APRESOLINE) 25 MG tablet TAKE 1 & 1/2 (ONE & ONE-HALF) TABLETS BY MOUTH THREE TIMES DAILY FOR BLOOD PRESSURE 405 tablet 0 08/01/2018 at 2000  . insulin aspart protamine- aspart (NOVOLOG MIX 70/30) (70-30) 100 UNIT/ML injection Inject 26-34 Units into the skin 2 (two) times daily with a meal. 34 units in morning and 26 units in the evening   08/01/2018 at 2000  . lisinopril-hydrochlorothiazide (PRINZIDE,ZESTORETIC) 20-12.5 MG tablet Take 1 tablet by mouth daily.    08/01/2018 at 0800  . pantoprazole (PROTONIX) 40 MG tablet Take 1 tablet (40 mg total)  by mouth daily. 90 tablet 1 08/01/2018 at 0800  . pravastatin (PRAVACHOL) 40 MG tablet Take 40 mg by mouth daily.    08/01/2018 at 2000  . tiotropium (SPIRIVA) 18 MCG inhalation capsule Place 1 capsule (18 mcg total) into inhaler and inhale daily. 30 capsule 12 08/01/2018 at 0800  . warfarin (COUMADIN) 5 MG tablet TAKE ONE TABLET BY MOUTH ONCE DAILY AT 5-6PM AND TAKE ONE AND ONE-HALF TABLET BY MOUTH ON THURSDAY (Patient taking differently: Take half tab every night at 6 pm.) 90 tablet 4 08/01/2018 at 2000  . OXYGEN Inhale into the lungs continuous.   Taking    Social History   Socioeconomic History  . Marital status: Married    Spouse name: Not on file  . Number of children: Not on file  . Years of education: Not on file  . Highest education level: Not on file  Occupational History  . Not on file  Social Needs  . Financial resource strain: Not on file  . Food insecurity:    Worry: Not on file    Inability: Not on file  . Transportation needs:    Medical: Not on file    Non-medical: Not on file  Tobacco Use  . Smoking status: Never Smoker  . Smokeless tobacco: Never Used  Substance and Sexual Activity  . Alcohol use: No  . Drug use: No  . Sexual activity: Never  Lifestyle  . Physical activity:    Days per week: Not on file    Minutes per session: Not on file  . Stress: Not on file  Relationships  . Social connections:    Talks on phone: Not on file    Gets together: Not on file    Attends religious service: Not on file    Active member of club or organization: Not on file    Attends meetings of clubs or organizations: Not on file    Relationship status: Not on file  . Intimate partner violence:    Fear of current or ex partner: Not on file    Emotionally abused: Not on file    Physically abused: Not on file    Forced sexual activity: Not on file  Other Topics Concern  . Not on file  Social History Narrative  . Not on file    History reviewed. No pertinent family history.    Review of systems complete and found to be negative unless listed above    PHYSICAL EXAM  General: Well developed, well nourished. Sitting up in the chair in no acute distress HEENT:  Normocephalic and atramatic Neck:  No JVD.  Lungs: On supplemental O2. Clear bilaterally to auscultation and percussion. Heart: HRRR . Normal S1 and S2 without gallops or murmurs.  Abdomen: Bowel sounds are positive, abdomen soft and non-tender  Msk:  Back normal.  Normal strength and tone for age. Gait not assessed Extremities: No clubbing or cyanosis.  2+  pitting edema bilaterally  Neuro: Alert and oriented X 3. Psych:  Good affect, responds appropriately  Labs:   Lab Results  Component Value Date   WBC 7.8 08/02/2018   HGB 12.2 08/02/2018   HCT 40.6 08/02/2018   MCV 86.0 08/02/2018   PLT 254 08/02/2018    Recent Labs  Lab 08/03/18 0513  NA 138  K 3.8  CL 99  CO2 33*  BUN 32*  CREATININE 1.43*  CALCIUM 8.9  GLUCOSE 58*   Lab Results  Component Value Date   CKTOTAL  196 (H) 08/17/2013   CKMB 2.3 08/17/2013   TROPONINI 0.07 (HH) 08/02/2018    Lab Results  Component Value Date   CHOL 169 09/28/2017   CHOL 160 08/18/2013   Lab Results  Component Value Date   HDL 56 09/28/2017   HDL 68 (H) 08/18/2013   Lab Results  Component Value Date   LDLCALC 87 09/28/2017   LDLCALC 76 08/18/2013   Lab Results  Component Value Date   TRIG 128 09/28/2017   TRIG 80 08/18/2013   No results found for: CHOLHDL No results found for: LDLDIRECT    Radiology: Dg Chest Portable 1 View  Result Date: 08/02/2018 CLINICAL DATA:  Shortness of breath.  Hypoxia.  COPD. EXAM: PORTABLE CHEST 1 VIEW COMPARISON:  12/03/2015 and 09/19/2015 FINDINGS: Cardiomegaly with mild pulmonary vascular congestion and slight bilateral interstitial accentuation. No discrete effusions. Pacemaker in place. Aortic valve replacement. Aortic atherosclerosis. IMPRESSION: Mild pulmonary vascular congestion and mild bilateral pulmonary edema. Mild cardiomegaly even considering the portable technique. Aortic Atherosclerosis (ICD10-I70.0). Electronically Signed   By: Lorriane Shire M.D.   On: 08/02/2018 15:08    EKG: Ventricular paced rhythm   ASSESSMENT AND PLAN:  1.  Acute on chronic congestive heart failure   -Echocardiogram pending  -Continue diuresis with Lasix with continual monitoring of kidney function   -Continue Coreg, hydralazine   -Continue I's and O's with daily weights  2.  Elevated troponin (.07)   -No chest pain, SOB improved; likely demand  ischemia in the setting of acute CHF 3.  Type 2 diabetes  -Continue sliding scale insulin  4.  Hypertension   -Continue current home medication regimen  5.  Acute kidney injury   -Will need to continue to closely monitor kidney function  6.  History of complete AV block s/p permanent pacemaker insertion   -Will need follow up outpatient appointment for pacemaker interrogation   The history, physical exam findings, and plan of care were all discussed with Dr. Bartholome Bill, and all decision making was made in collaboration.   Signed: Avie Arenas PA-C 08/03/2018, 10:15 AM   Pt seen and examined. No change from above.

## 2018-08-03 NOTE — Progress Notes (Signed)
*  PRELIMINARY RESULTS* Echocardiogram 2D Echocardiogram has been performed.  Kimberly Peterson 08/03/2018, 9:07 AM

## 2018-08-03 NOTE — Progress Notes (Signed)
Inpatient Diabetes Program Recommendations  AACE/ADA: New Consensus Statement on Inpatient Glycemic Control (2015)  Target Ranges:  Prepandial:   less than 140 mg/dL      Peak postprandial:   less than 180 mg/dL (1-2 hours)      Critically ill patients:  140 - 180 mg/dL   Results for MIRAGE, PFEFFERKORN (MRN [id number]) as of 08/03/2018 10:51  Ref. Range 08/02/2018 17:56 08/02/2018 20:38 08/03/2018 07:35 08/03/2018 08:27  Glucose-Capillary Latest Ref Range: 70 - 99 mg/dL 169 (H)  2 units NOVOLOG +  30 units 70/30 Insulin 136 (H) 67 (L) 114 (H)    30 units 70/30 Insulin   Admit with: Acute on chronic congestive heart failure systolic  History: DM, CHF  Home DM Meds: 70/30 Insulin- 34 units AM/ 26 units PM  Current Orders: 70/30 Insulin- 30 units BID      Novolog Sensitive Correction Scale/ SSI (0-9 units) TID AC     MD- Patient with Mild Hypoglycemia this AM.  Received 30 units 70/30 Insulin last PM.  May consider reducing 70/30 Insulin doses slightly to 27 units BID with meals (10% overall reduction of doses)    --Will follow patient during hospitalization--  Wyn Quaker RN, MSN, CDE Diabetes Coordinator Inpatient Glycemic Control Team Team Pager: [phone] (8a-5p)

## 2018-08-04 LAB — BASIC METABOLIC PANEL
Anion gap: 6 (ref 5–15)
BUN: 39 mg/dL — ABNORMAL HIGH (ref 8–23)
CHLORIDE: 98 mmol/L (ref 98–111)
CO2: 33 mmol/L — ABNORMAL HIGH (ref 22–32)
Calcium: 8.7 mg/dL — ABNORMAL LOW (ref 8.9–10.3)
Creatinine, Ser: 1.44 mg/dL — ABNORMAL HIGH (ref 0.44–1.00)
GFR calc Af Amer: 38 mL/min — ABNORMAL LOW (ref >60)
GFR calc non Af Amer: 33 mL/min — ABNORMAL LOW (ref >60)
Glucose, Bld: 181 mg/dL — ABNORMAL HIGH (ref 70–99)
Potassium: 3.9 mmol/L (ref 3.5–5.1)
Sodium: 137 mmol/L (ref 135–145)

## 2018-08-04 LAB — GLUCOSE, CAPILLARY
Glucose-Capillary: 158 mg/dL — ABNORMAL HIGH (ref 70–99)
Glucose-Capillary: 202 mg/dL — ABNORMAL HIGH (ref 70–99)
Glucose-Capillary: 239 mg/dL — ABNORMAL HIGH (ref 70–99)
Glucose-Capillary: 185 mg/dL — ABNORMAL HIGH (ref 70–99)

## 2018-08-04 MED ORDER — SALINE SPRAY 0.65 % NA SOLN
1.0000 | NASAL | Status: DC | PRN
  Administered 2018-08-05: 1 via NASAL
  Filled 2018-08-04: qty 44

## 2018-08-04 MED ORDER — FUROSEMIDE 40 MG PO TABS
40.0000 mg | ORAL_TABLET | Freq: Every day | ORAL | Status: DC
  Administered 2018-08-05: 40 mg via ORAL
  Filled 2018-08-04: qty 1

## 2018-08-04 NOTE — Care Management Note (Signed)
Case Management Note  Patient Details  Name: Kimberly Peterson MRN: [id number] Date of Birth: May 09, 1930  Subjective/Objective:   Patient is from home with daughter.  Admitted with acute on chronic CHF.  Chronic 2L oxygen.   Current with PCP, Dr. Humphrey Rolls.  Obtains medications at [facility] on [address].  Denies difficulties obtaining medications or with accessing medical care.  Her daughter drives her to appointments.  She has not been to the HF clinic.  Has a functioning scale at home and weighs every 3 days.  Daughter states she is admitted once a year.  Offered home health services and explained the [place].  They would like to discharge with [facility] for [place].  Referral made and accepted by [name].  List of agencies and ratings provided.                  Action/Plan:   Expected Discharge Date:                  Expected Discharge Plan:  [place]  In-House Referral:  [facility]  Discharge planning Services  CM Consult  Post Acute Care Choice:  Home Health Choice offered to:  Patient  DME Arranged:    DME Agency:     HH Arranged:  RN(ReDS Vest) [place]:  [name]  Status of Service:  Completed, signed off  If discussed at [place] of Stay Meetings, dates discussed:    Additional Comments:  Elza Rafter, RN 08/04/2018, 3:04 PM

## 2018-08-04 NOTE — Plan of Care (Signed)
Nutrition Education Note  RD consulted for nutrition education regarding CHF.  Spoke with pt at bedside who reports having a good appetite and eating 3 meals daily.  Breakfast: scrambled eggs, waffle or potato patty, sliced orange, sausage Lunch: soup and sandwich Dinner: meat, vegetable, and starch  Pt shares that she does not eat pork, does not use salt when cooking or at the table, and watches her fluid intake. Pt shares that she is not supposed to drink more than 3 L of fluid daily. Pt states that she does not weigh herself daily. Pt shares that she eats well because "I've had diabetes for years."  RD provided "Heart Failure Nutrition Therapy" handout from the Academy of Nutrition and Dietetics. Reviewed patient's dietary recall. Provided examples on ways to decrease sodium intake in diet. Discouraged intake of processed foods and use of salt shaker. Encouraged fresh fruits and vegetables as well as whole grain sources of carbohydrates to maximize fiber intake.   RD discussed why it is important for patient to adhere to diet recommendations, and emphasized the role of fluids, foods to avoid, and importance of weighing self daily. Teach back method used.  Expect fair compliance.  Body mass index is 38.08 kg/m. Pt meets criteria for obesity class II based on current BMI.  Current diet order is 2 gram sodium, patient is consuming approximately 100% of meals at this time. Labs and medications reviewed. No further nutrition interventions warranted at this time. RD contact information provided. If additional nutrition issues arise, please re-consult RD.    Gaynell Face, MS, RD, LDN Inpatient Clinical Dietitian Pager: [phone] Weekend/After Hours: [phone]

## 2018-08-04 NOTE — Plan of Care (Signed)
  Problem: Health Behavior/Discharge Planning: Goal: Ability to manage health-related needs will improve Outcome: Progressing   Problem: Clinical Measurements: Goal: Ability to maintain clinical measurements within normal limits will improve Outcome: Progressing Goal: Respiratory complications will improve Outcome: Progressing

## 2018-08-04 NOTE — Progress Notes (Signed)
Provided patient education to Kimberly Peterson. We spoke about the importance of taking Kimberly Peterson medications everyday. She stated she never misses doses as she is always home. I stressed the importance of weighting herself on a daily bases and call Kimberly Peterson doctor if she gains > 3 lbs in one night. She did not have any questions about Kimberly Peterson medications and is not experiencing any side effects. I gave Kimberly Peterson the Heart failure education packet and went through it with Kimberly Peterson.   Thank you for allowing me to be involved in this patient's care.  Eleonore Chiquito, PharmD, BCPS Clinical Pharmacist 08/04/2018

## 2018-08-04 NOTE — Progress Notes (Addendum)
[facility] [place] NAME: Tristine Langi    MR#:  [id number]  DATE OF BIRTH:  Nov 24, 1929  SUBJECTIVE:  CHIEF COMPLAINT:   Chief Complaint  Patient presents with  . Shortness of Breath  Patient seen and evaluated today Has decreased shortness of breath Back to oxygen on nasal cannula at 2 L Swelling in the lower extremities improving  REVIEW OF SYSTEMS:    ROS  CONSTITUTIONAL: No documented fever. No fatigue, weakness. No weight gain, no weight loss.  EYES: No blurry or double vision.  ENT: No tinnitus. No postnasal drip. No redness of the oropharynx.  RESPIRATORY: No cough, no wheeze, no hemoptysis. Has dyspnea.  CARDIOVASCULAR: No chest pain. No orthopnea. No palpitations. No syncope.  GASTROINTESTINAL: No nausea, no vomiting or diarrhea. No abdominal pain. No melena or hematochezia.  GENITOURINARY: No dysuria or hematuria.  ENDOCRINE: No polyuria or nocturia. No heat or cold intolerance.  HEMATOLOGY: No anemia. No bruising. No bleeding.  INTEGUMENTARY: No rashes. No lesions.  MUSCULOSKELETAL: No arthritis. Has swelling. No gout.  NEUROLOGIC: No numbness, tingling, or ataxia. No seizure-type activity.  PSYCHIATRIC: No anxiety. No insomnia. No ADD.   DRUG ALLERGIES:  No Known Allergies  VITALS:  Blood pressure 135/60, pulse 70, temperature 98.1 F (36.7 C), temperature source Oral, resp. rate (!) 22, height 5\' 2"  (1.575 m), weight 94.4 kg, SpO2 93 %.  PHYSICAL EXAMINATION:   Physical Exam  GENERAL:  85 y.o.-year-old patient lying in the bed with no acute distress.  EYES: Pupils equal, round, reactive to light and accommodation. No scleral icterus. Extraocular muscles intact.  HEENT: Head atraumatic, normocephalic. Oropharynx and nasopharynx clear.  NECK:  Supple, no jugular venous distention. No thyroid enlargement, no tenderness.  LUNGS: Improved breath sounds bilaterally, bibasilar crepitations decreased. No use of accessory  muscles of respiration.  CARDIOVASCULAR: S1, S2 normal. No murmurs, rubs, or gallops.  ABDOMEN: Soft, nontender, nondistended. Bowel sounds present. No organomegaly or mass.  EXTREMITIES: No cyanosis, clubbing  Bilateral edema decreasing.    NEUROLOGIC: Cranial nerves II through XII are intact. No focal Motor or sensory deficits b/l.   PSYCHIATRIC: The patient is alert and oriented x 3.  SKIN: No obvious rash, lesion, or ulcer.   LABORATORY PANEL:   CBC Recent Labs  Lab 08/02/18 1503  WBC 7.8  HGB 12.2  HCT 40.6  PLT 254   ------------------------------------------------------------------------------------------------------------------ Chemistries  Recent Labs  Lab 08/04/18 0451  NA 137  K 3.9  CL 98  CO2 33*  GLUCOSE 181*  BUN 39*  CREATININE 1.44*  CALCIUM 8.7*   ------------------------------------------------------------------------------------------------------------------  Cardiac Enzymes Recent Labs  Lab 08/02/18 1503  TROPONINI 0.07*   ------------------------------------------------------------------------------------------------------------------  RADIOLOGY:  Dg Chest Portable 1 View  Result Date: 08/02/2018 CLINICAL DATA:  Shortness of breath.  Hypoxia.  COPD. EXAM: PORTABLE CHEST 1 VIEW COMPARISON:  12/03/2015 and 09/19/2015 FINDINGS: Cardiomegaly with mild pulmonary vascular congestion and slight bilateral interstitial accentuation. No discrete effusions. Pacemaker in place. Aortic valve replacement. Aortic atherosclerosis. IMPRESSION: Mild pulmonary vascular congestion and mild bilateral pulmonary edema. Mild cardiomegaly even considering the portable technique. Aortic Atherosclerosis (ICD10-I70.0). Electronically Signed   By: Lorriane Shire M.D.   On: 08/02/2018 15:08     ASSESSMENT AND PLAN:  85 year old female patient with history of type 2 diabetes mellitus, hypertension, chronic systolic heart failure, COPD currently under hospitalist service for  swelling in the lower extremities and difficulty breathing  -Acute on chronic systolic heart failure exacerbation  resolving Switch to oral Lasix once daily Monitor renal function Status post cardiology consult Echocardiogram reviewed EF 55 to 60% Input output chart and daily body weight Heart failure clinic follow-up Continue beta-blocker and aspirin  -Elevated troponin secondary to demand ischemia from heart failure exacerbation  -Type 2 diabetes mellitus Diabetic diet with sliding scale coverage with insulin  -COPD stable  -Mild renal insufficiency secondary to diuretics Monitor renal function  -DVT prophylaxis subcu Lovenox daily  All the records are reviewed and case discussed with Care Management/Social Worker. Management plans discussed with the patient, family and they are in agreement.  CODE STATUS: Full code  DVT Prophylaxis: SCDs  TOTAL TIME TAKING CARE OF THIS PATIENT: 35 minutes.   POSSIBLE D/C IN 2 to 3 DAYS, DEPENDING ON CLINICAL CONDITION.  Saundra Shelling M.D on 08/04/2018 at 1:33 PM  Between 7am to 6pm - Pager - [phone]  After 6pm go to [email or web address] - password [email or web address] [facility]  Office  [phone]  CC: Primary care physician; [name], MD  Note: This dictation was prepared with Dragon dictation along with smaller phrase technology. Any transcriptional errors that result from this process are unintentional.

## 2018-08-05 LAB — BASIC METABOLIC PANEL
Anion gap: 7 (ref 5–15)
BUN: 42 mg/dL — AB (ref 8–23)
CO2: 34 mmol/L — ABNORMAL HIGH (ref 22–32)
Calcium: 8.4 mg/dL — ABNORMAL LOW (ref 8.9–10.3)
Chloride: 98 mmol/L (ref 98–111)
Creatinine, Ser: 1.55 mg/dL — ABNORMAL HIGH (ref 0.44–1.00)
GFR calc Af Amer: 35 mL/min — ABNORMAL LOW (ref >60)
GFR calc non Af Amer: 30 mL/min — ABNORMAL LOW (ref >60)
Glucose, Bld: 228 mg/dL — ABNORMAL HIGH (ref 70–99)
POTASSIUM: 4.3 mmol/L (ref 3.5–5.1)
Sodium: 139 mmol/L (ref 135–145)

## 2018-08-05 LAB — GLUCOSE, CAPILLARY
GLUCOSE-CAPILLARY: 194 mg/dL — AB (ref 70–99)
Glucose-Capillary: 281 mg/dL — ABNORMAL HIGH (ref 70–99)

## 2018-08-05 LAB — PROTIME-INR
INR: 1.64
Prothrombin Time: 19.2 seconds — ABNORMAL HIGH (ref 11.4–15.2)

## 2018-08-05 MED ORDER — LISINOPRIL 20 MG PO TABS: 20.0000 mg | ORAL_TABLET | Freq: Every day | ORAL | 0 refills | Status: DC

## 2018-08-05 MED ORDER — WARFARIN - PHYSICIAN DOSING INPATIENT: Freq: Every day | Status: DC

## 2018-08-05 MED ORDER — WARFARIN SODIUM 5 MG PO TABS
5.0000 mg | ORAL_TABLET | Freq: Once | ORAL | Status: DC
  Filled 2018-08-05: qty 1

## 2018-08-05 MED ORDER — WARFARIN SODIUM 5 MG PO TABS
5.0000 mg | ORAL_TABLET | Freq: Once | ORAL | Status: AC
  Administered 2018-08-05: 5 mg via ORAL
  Filled 2018-08-05: qty 1

## 2018-08-05 NOTE — Care Management (Signed)
Kimberly Peterson with [facility] is aware of patient DC today to start [facility] services.

## 2018-08-05 NOTE — Progress Notes (Signed)
Nutrition Brief Note   RD received consult for CHF education. Pt received low sodium diet education 1/29 (see note from [facility]). Pt provided with Heart Failure Nutrition Therapy handouts at that time.   If further education is needed please contact RD  Koleen Distance MS, RD, LDN Pager #[phone] Office#- [phone] After Hours Pager: [phone]'

## 2018-08-05 NOTE — Discharge Summary (Signed)
[place] [facility] [place] NAME: Kimberly Peterson    MR#:  [id number]  DATE OF BIRTH:  11-05-1929  DATE OF ADMISSION:  08/02/2018 ADMITTING PHYSICIAN: Fritzi Mandes, MD  DATE OF DISCHARGE: 08/05/2018  PRIMARY CARE PHYSICIAN: [name], MD   ADMISSION DIAGNOSIS:  Acute respiratory failure with hypoxia (HCC) [J96.01] Acute on chronic congestive heart failure, unspecified heart failure type ([place]) [I50.9]  DISCHARGE DIAGNOSIS:  Active Problems:   Acute on chronic systolic CHF (congestive heart failure) (HCC) Acute respiratory distress with hypoxia secondary to heart failure exacerbation COPD Type 2 diabetes mellitus Hypertension  SECONDARY DIAGNOSIS:   Past Medical History:  Diagnosis Date  . CHF (congestive heart failure) ([place])   . COPD (chronic obstructive pulmonary disease) ([place])   . Diabetes mellitus without complication ([place])   . Hypertension      ADMITTING HISTORY Kimberly Peterson  is a 85 y.o. female with a known history of COPD, diabetes, hypertension and CHF systolic chronic comes to the emergency room from PCPs office after she started noticing increasing shortness of breath and leg swelling to the point she developed blisters and which burst with clear serial segment is fluid discharge. Patient is a poor historian. Daughter is present in the room tells me that her legs have been swollen she is had poor appetite whenever she gains fluid. She was noted to be an acute on chronic congestive heart failure systolic. Patient received IV Lasix 80 mg in the ER she is being admitted for further evaluation managed He uses 2 L of nasal cannula oxygen at home. Her stats were in the 80s on 3 L.   HOSPITAL COURSE:  Patient was admitted to telemetry.  She was initially put on oxygen via nasal cannula at 4 L and aggressively diuresed with Lasix intravenously.  She also had a wound dressings for her leg blisters.  Her swelling of the legs and leg edema  improved with diuresis.  Electrolytes were corrected.  Patient continued aspirin, beta-blocker along with ACE inhibitor during hospitalization.  Renal function slightly bumped up secondary to diuresis.  Swelling of the legs improved.  Shortness of breath resolved.  Back to baseline oxygen via nasal cannula at 2 L.  Patient was switched to oral Lasix.  She will be discharged home with home health services.  Follow-up with Dr. Ubaldo Glassing in the clinic along with heart failure clinic.  Patient will continue aspirin, beta-blocker, ACE inhibitor.  Resume for anticoagulation.  CONSULTS OBTAINED:  Treatment Team:  Teodoro Spray, MD  DRUG ALLERGIES:  No Known Allergies  DISCHARGE MEDICATIONS:   Allergies as of 08/05/2018   No Known Allergies     Medication List    STOP taking these medications   lisinopril-hydrochlorothiazide 20-12.5 MG tablet Commonly known as:  PRINZIDE,ZESTORETIC   pravastatin 40 MG tablet Commonly known as:  PRAVACHOL     TAKE these medications   aspirin 81 MG tablet Take 81 mg by mouth daily.   atorvastatin 10 MG tablet Commonly known as:  LIPITOR TAKE 1 TABLET BY MOUTH ONCE DAILY FOR CHOLESTEROL   carvedilol 25 MG tablet Commonly known as:  COREG TAKE ONE TABLET BY MOUTH TWICE DAILY   diltiazem 240 MG 24 hr capsule Commonly known as:  CARDIZEM CD TAKE 1 CAPSULE BY MOUTH ONCE DAILY   furosemide 40 MG tablet Commonly known as:  LASIX TAKE 1 TABLET BY MOUTH ONCE DAILY FOR  FLUID.  MAY  TAKE  HALF  EXTRA  IF  NEEDED   glucose blood test strip Commonly known as:  PRODIGY NO CODING BLOOD GLUC 1 each by Other route 3 (three) times daily. Use as instructed   hydrALAZINE 25 MG tablet Commonly known as:  APRESOLINE TAKE 1 & 1/2 (ONE & ONE-HALF) TABLETS BY MOUTH THREE TIMES DAILY FOR BLOOD PRESSURE   insulin aspart protamine- aspart (70-30) 100 UNIT/ML injection Commonly known as:  NOVOLOG MIX 70/30 Inject 26-34 Units into the skin 2 (two) times daily with a  meal. 34 units in morning and 26 units in the evening   lisinopril 20 MG tablet Commonly known as:  PRINIVIL,ZESTRIL Take 1 tablet (20 mg total) by mouth daily for 30 days. Start taking on:  August 09, 2018   OXYGEN Inhale into the lungs continuous.   pantoprazole 40 MG tablet Commonly known as:  PROTONIX Take 1 tablet (40 mg total) by mouth daily.   Prodigy Autocode Blood Glucose Devi Use as directed   tiotropium 18 MCG inhalation capsule Commonly known as:  SPIRIVA Place 1 capsule (18 mcg total) into inhaler and inhale daily.   warfarin 5 MG tablet Commonly known as:  COUMADIN TAKE ONE TABLET BY MOUTH ONCE DAILY AT 5-6PM AND TAKE ONE AND ONE-HALF TABLET BY MOUTH ON THURSDAY What changed:  See the new instructions.       Today  Patient seen today Decreased swelling in the legs Comfortably breathing oxygen via nasal cannula at 2 L No chest pain Tolerating diet well Hemodynamically stable VITAL SIGNS:  Blood pressure (!) 138/54, pulse 73, temperature 98.6 F (37 C), temperature source Oral, resp. rate 18, height 5\' 2"  (1.575 m), weight 94.7 kg, SpO2 100 %.  I/O:    Intake/Output Summary (Last 24 hours) at 08/05/2018 1036 Last data filed at 08/05/2018 1031 Gross per 24 hour  Intake -  Output 1800 ml  Net -1800 ml    PHYSICAL EXAMINATION:  Physical Exam  GENERAL:  85 y.o.-year-old patient lying in the bed with no acute distress.  LUNGS: Normal breath sounds bilaterally, no wheezing, rales,rhonchi or crepitation. No use of accessory muscles of respiration.  CARDIOVASCULAR: S1, S2 normal. No murmurs, rubs, or gallops.  ABDOMEN: Soft, non-tender, non-distended. Bowel sounds present. No organomegaly or mass.  NEUROLOGIC: Moves all 4 extremities. PSYCHIATRIC: The patient is alert and oriented x 3.  SKIN: No obvious rash, lesion, or ulcer.   DATA REVIEW:   CBC Recent Labs  Lab 08/02/18 1503  WBC 7.8  HGB 12.2  HCT 40.6  PLT 254    Chemistries  Recent  Labs  Lab 08/05/18 0420  NA 139  K 4.3  CL 98  CO2 34*  GLUCOSE 228*  BUN 42*  CREATININE 1.55*  CALCIUM 8.4*    Cardiac Enzymes Recent Labs  Lab 08/02/18 1503  TROPONINI 0.07*    Microbiology Results  Results for orders placed or performed in visit on 06/09/18  Microscopic Examination     Status: Abnormal   Collection Time: 06/09/18  3:02 PM  Result Value Ref Range Status   WBC, UA 0-5 0 - 5 /hpf Final   RBC, UA 0-2 0 - 2 /hpf Final   Epithelial Cells (non renal) 0-10 0 - 10 /hpf Final   Casts Present (A) None seen /lpf Final   Cast Type Hyaline casts N/A Final   Mucus, UA Present Not Estab. Final   Bacteria, UA None seen None seen/Few Final    RADIOLOGY:  No results found.  Follow up  with PCP in 1 week.  Management plans discussed with the patient, family and they are in agreement.  CODE STATUS: Full code    Code Status Orders  (From admission, onward)         Start     Ordered   08/02/18 1746  Full code  Continuous     08/02/18 1745        Code Status History    Date Active Date Inactive Code Status Order ID Comments User Context   12/04/2015 0346 12/06/2015 1852 Full Code [id number]  Quintella Baton, MD Inpatient   09/19/2015 1427 09/21/2015 2113 Full Code [id number]  Robbie Lis, MD Inpatient   09/23/2013 1544 09/29/2013 1741 Full Code [id number]  Rush Farmer, MD ED    Advance Directive Documentation     Most Recent Value  Type of Advance Directive  Healthcare Power of Attorney  Pre-existing out of facility DNR order (yellow form or pink MOST form)  -  "MOST" Form in Place?  -      TOTAL TIME TAKING CARE OF THIS PATIENT ON DAY OF DISCHARGE: more than 35 minutes.   Saundra Shelling M.D on 08/05/2018 at 10:36 AM  Between 7am to 6pm - Pager - [phone]  After 6pm go to [email or web address] - password [email or web address] [facility]  Office  [phone]  CC: Primary care physician; [name], MD  Note: This dictation was prepared  with Dragon dictation along with smaller phrase technology. Any transcriptional errors that result from this process are unintentional.

## 2018-08-05 NOTE — Consult Note (Signed)
[place] Nurse wound consult note Patient receiving care in [facility].  Daughter present. Reason for Consult: LLE wound.  Patient is being discharged today per the primary RN.  [place] team not previously consulted. Wound type: Ruptured bulla with overlying tissue missing.  The daughter states the "skin came off from the blister when I took her stocking off before she came to the hospital". Measurement: 3.2 cm x 3 cm x no measurable depth.  Just needs to re-epithelialize Wound bed: 100% beefy red Drainage (amount, consistency, odor) No drainage, no odor, no induration Periwound: Intact, dry Dressing procedure/placement/frequency: Apply Sween Moisturizer (pink and white tube in clean utility) to LLE.  Place a piece of vaseline gauze over the open area on the leg. Secure with kerlex.  When the patient goes home, she should have the moisturizer, vaseline directly on the wound, and a 2 x 2 gauze secured with kerlex. Change daily.  Vaseline and dry gauze should be easy and inexpensive for the patient/family to obtain; more so than vaseline gauze. Monitor the wound area(s) for worsening of condition such as: Signs/symptoms of infection,  Increase in size,  Development of or worsening of odor, Development of pain, or increased pain at the affected locations.  Notify the medical team if any of these develop.  Thank you for the consult.  Discussed plan of care with the patient and bedside nurse.  [place] nurse will not follow at this time.  Please re-consult the [facility] team if needed.  Val Riles, RN, MSN, CWOCN, CNS-BC, pager [phone]

## 2018-08-05 NOTE — Care Management Important Message (Signed)
Copy of signed Medicare IM left with patient in room. 

## 2018-08-05 NOTE — Progress Notes (Signed)
Wound care completed on patient. Discharge instructions given to patient and daughter. Verbalized understanding. No acute distress at this time. Daughter at bedside and will transport patient home.

## 2018-08-05 NOTE — Plan of Care (Signed)
  Problem: Clinical Measurements: Goal: Respiratory complications will improve Outcome: Progressing   

## 2018-08-06 ENCOUNTER — Other Ambulatory Visit: Payer: Self-pay | Admitting: *Deleted

## 2018-08-06 NOTE — Patient Outreach (Signed)
[facility]) Care Management  08/06/2018  ERIELLE GAWRONSKI 1930-02-04 [id number]   Referral received from inpatient nurse as member was recently admitted to hospital for heart failure.  Primary MD office will complete transition of care assessment.  Per chart, she also has history of hypertension, diabetes, and hyperlipidemia.    Call placed to member, identity verified.  She state she lives with daughter, [name], and would like this care manager to speak with her.  This care manager introduced self and stated purpose of call.  [facility] care management services explained.  She report member is doing well, she monitors her weight daily.  Today was 194 pounds.  Member has follow up appointment with cardiologist on 2/5, with pulmonologist regarding CPAP on 2/24.    Daughter manages member's medications, denies any concern with management but does state her medications are expensive.  State one of her meds can cost up to $200, report member paying out of pocket for cost.  Agree to contact from [facility] pharmacist for review and potential assistance.    Denies any urgent concerns at this time, agree to home visit within the next 2 weeks.  Will complete initial assessment/home evaluation at that time.  [facility] CM Care Plan Problem One     Most Recent Value  Care Plan Problem One  Risk for hospitalization related to heart failure complications as evidenced by recent hospital admission  Role Documenting the Problem One  Care Management [place] for Problem One  Active  [facility] Long Term Goal   Member will not be readmitted to hospital within the next 45 days.  [facility] Long Term Goal Start Date  08/06/18  Interventions for Problem One Long Term Goal  Discharge instructions reviewed, including heart failure zones.    [facility] CM Short Term Goal #1   Member will take medications as prescribed over the next 4 weeks  [facility] CM Short Term Goal #1 Start Date  08/06/18  Interventions for Short Term Goal #1   Medication changes reviewed with daughter, including specific changes.  Referral placed to pharmacy to review medication cost  [facility] CM Short Term Goal #2   Member will weigh self daily over the next 4 weeks  [facility] CM Short Term Goal #2 Start Date  08/06/18  Interventions for Short Term Goal #2  Daughter educated on heart failure zones and importance of daily weights.  Reviewed when to contact MD based on weight changes      Valente David, RN, MSN [place] [phone]

## 2018-08-07 DIAGNOSIS — E669 Obesity, unspecified: Secondary | ICD-10-CM | POA: Diagnosis not present

## 2018-08-07 DIAGNOSIS — Z952 Presence of prosthetic heart valve: Secondary | ICD-10-CM | POA: Diagnosis not present

## 2018-08-07 DIAGNOSIS — Z794 Long term (current) use of insulin: Secondary | ICD-10-CM | POA: Diagnosis not present

## 2018-08-07 DIAGNOSIS — I5023 Acute on chronic systolic (congestive) heart failure: Secondary | ICD-10-CM | POA: Diagnosis not present

## 2018-08-07 DIAGNOSIS — Z7901 Long term (current) use of anticoagulants: Secondary | ICD-10-CM | POA: Diagnosis not present

## 2018-08-07 DIAGNOSIS — Z6836 Body mass index (BMI) 36.0-36.9, adult: Secondary | ICD-10-CM | POA: Diagnosis not present

## 2018-08-07 DIAGNOSIS — E119 Type 2 diabetes mellitus without complications: Secondary | ICD-10-CM | POA: Diagnosis not present

## 2018-08-07 DIAGNOSIS — I110 Hypertensive heart disease with heart failure: Secondary | ICD-10-CM | POA: Diagnosis not present

## 2018-08-07 DIAGNOSIS — J441 Chronic obstructive pulmonary disease with (acute) exacerbation: Secondary | ICD-10-CM | POA: Diagnosis not present

## 2018-08-07 DIAGNOSIS — Z9981 Dependence on supplemental oxygen: Secondary | ICD-10-CM | POA: Diagnosis not present

## 2018-08-07 DIAGNOSIS — Z950 Presence of cardiac pacemaker: Secondary | ICD-10-CM | POA: Diagnosis not present

## 2018-08-09 ENCOUNTER — Telehealth: Payer: Self-pay

## 2018-08-09 NOTE — Telephone Encounter (Signed)
[name], [facility] NURSE, CALLED TO GET VERBAL ORDER FOR PT. [name] WAS FOLLOWING UP WITH PT AFTER HOSPITAL VISIT FOR CHF. PT IS ON OXYGEN AND WARFARIN.   PT NEEDS SKILLED NURSING FOR CHF,COPD, AND MEDICATION MANAGEMENT - 1 WEEK- 1 TIME - 2 WEEKS- 3 TIMES - 2 PRN VISITS  NURSE TOLD ME TO CALL [phone] AND CALL CINDY TO GIVE VERBAL ORDER.  CALLED CINDY PER NIMISHA AND VERBAL ORDER WAS GIVEN.

## 2018-08-10 ENCOUNTER — Telehealth: Payer: Self-pay

## 2018-08-10 DIAGNOSIS — I110 Hypertensive heart disease with heart failure: Secondary | ICD-10-CM | POA: Diagnosis not present

## 2018-08-10 DIAGNOSIS — E669 Obesity, unspecified: Secondary | ICD-10-CM | POA: Diagnosis not present

## 2018-08-10 DIAGNOSIS — Z6836 Body mass index (BMI) 36.0-36.9, adult: Secondary | ICD-10-CM | POA: Diagnosis not present

## 2018-08-10 DIAGNOSIS — I5023 Acute on chronic systolic (congestive) heart failure: Secondary | ICD-10-CM | POA: Diagnosis not present

## 2018-08-10 DIAGNOSIS — E119 Type 2 diabetes mellitus without complications: Secondary | ICD-10-CM | POA: Diagnosis not present

## 2018-08-10 DIAGNOSIS — J441 Chronic obstructive pulmonary disease with (acute) exacerbation: Secondary | ICD-10-CM | POA: Diagnosis not present

## 2018-08-10 NOTE — Telephone Encounter (Signed)
Gave verbal order [facility] twice a week for 3 weeks [phone]

## 2018-08-11 ENCOUNTER — Ambulatory Visit (INDEPENDENT_AMBULATORY_CARE_PROVIDER_SITE_OTHER): Payer: Medicare Other

## 2018-08-11 ENCOUNTER — Ambulatory Visit: Payer: Medicare Other | Attending: Family | Admitting: Family

## 2018-08-11 ENCOUNTER — Encounter: Payer: Self-pay | Admitting: Family

## 2018-08-11 VITALS — BP 157/69 | HR 87 | Resp 18 | Ht 63.0 in | Wt 212.2 lb

## 2018-08-11 DIAGNOSIS — N189 Chronic kidney disease, unspecified: Secondary | ICD-10-CM | POA: Insufficient documentation

## 2018-08-11 DIAGNOSIS — I5032 Chronic diastolic (congestive) heart failure: Secondary | ICD-10-CM | POA: Diagnosis not present

## 2018-08-11 DIAGNOSIS — Z950 Presence of cardiac pacemaker: Secondary | ICD-10-CM | POA: Diagnosis not present

## 2018-08-11 DIAGNOSIS — E1122 Type 2 diabetes mellitus with diabetic chronic kidney disease: Secondary | ICD-10-CM | POA: Insufficient documentation

## 2018-08-11 DIAGNOSIS — I10 Essential (primary) hypertension: Secondary | ICD-10-CM

## 2018-08-11 DIAGNOSIS — N183 Chronic kidney disease, stage 3 (moderate): Secondary | ICD-10-CM

## 2018-08-11 DIAGNOSIS — Z7901 Long term (current) use of anticoagulants: Secondary | ICD-10-CM | POA: Insufficient documentation

## 2018-08-11 DIAGNOSIS — I130 Hypertensive heart and chronic kidney disease with heart failure and stage 1 through stage 4 chronic kidney disease, or unspecified chronic kidney disease: Secondary | ICD-10-CM | POA: Insufficient documentation

## 2018-08-11 DIAGNOSIS — Z7982 Long term (current) use of aspirin: Secondary | ICD-10-CM | POA: Diagnosis not present

## 2018-08-11 DIAGNOSIS — Z794 Long term (current) use of insulin: Secondary | ICD-10-CM | POA: Insufficient documentation

## 2018-08-11 DIAGNOSIS — Z79899 Other long term (current) drug therapy: Secondary | ICD-10-CM | POA: Insufficient documentation

## 2018-08-11 DIAGNOSIS — E1022 Type 1 diabetes mellitus with diabetic chronic kidney disease: Secondary | ICD-10-CM

## 2018-08-11 DIAGNOSIS — Z952 Presence of prosthetic heart valve: Secondary | ICD-10-CM | POA: Diagnosis not present

## 2018-08-11 DIAGNOSIS — I890 Lymphedema, not elsewhere classified: Secondary | ICD-10-CM | POA: Insufficient documentation

## 2018-08-11 DIAGNOSIS — I509 Heart failure, unspecified: Secondary | ICD-10-CM | POA: Diagnosis present

## 2018-08-11 DIAGNOSIS — J449 Chronic obstructive pulmonary disease, unspecified: Secondary | ICD-10-CM | POA: Diagnosis not present

## 2018-08-11 DIAGNOSIS — I4891 Unspecified atrial fibrillation: Secondary | ICD-10-CM | POA: Diagnosis not present

## 2018-08-11 NOTE — Patient Instructions (Addendum)
Continue weighing daily and call for an overnight weight gain of > 2 pounds or a weekly weight gain of >5 pounds.   Make a follow up appointment with Cardiologist Serafina Royals [phone]

## 2018-08-11 NOTE — Progress Notes (Signed)
Pt INR was 1.9 as per Heather no change continue same

## 2018-08-11 NOTE — Progress Notes (Signed)
Patient ID: Kimberly Peterson, female    DOB: [birth year], 85 y.o.   MRN: [id number]  HPI  Kimberly Peterson is a 85 y/o female with a history of DM, HTN, CKD, COPD and chronic heart failure.   Echo report from 08/03/2018 reviewed and showed an EF of 55-65% along with an elevated PA pressure of 78 mm Hg.  Admitted 08/02/2018 due to acute on chronic HF. Cardiology consult obtained. Initially given IV lasix and then transitioned to oral diuretics. Wound consult also obtained due to blisters on her legs. Discharged after 3 days.   She presents today for her initial visit with a chief complaint of minimal shortness of breath upon moderate exertion. She describes this as chronic in nature having been present for several years. She has associated pedal edema along with this. She denies any difficulty sleeping, dizziness, abdominal distention, palpitations, chest pain, fatigue or weight gain. Is having some difficulty weighing herself because she is unsteady on her feet so she is not able to stand very still when on the scale.   Past Medical History:  Diagnosis Date  . CHF (congestive heart failure) ([place])   . Chronic kidney disease   . COPD (chronic obstructive pulmonary disease) ([place])   . Diabetes mellitus without complication ([place])   . Hypertension    Past Surgical History:  Procedure Laterality Date  . aoric valve replacemet      st Jude  . CATARACT EXTRACTION, BILATERAL    . PACEMAKER PLACEMENT    . VALVE REPLACEMENT     History reviewed. No pertinent family history. Social History   Tobacco Use  . Smoking status: Never Smoker  . Smokeless tobacco: Never Used  Substance Use Topics  . Alcohol use: No   No Known Allergies Prior to Admission medications   Medication Sig Start Date End Date Taking? Authorizing Provider  aspirin 81 MG tablet Take 81 mg by mouth daily.   Yes [provider]  atorvastatin (LIPITOR) 10 MG tablet TAKE 1 TABLET BY MOUTH ONCE DAILY FOR CHOLESTEROL 09/24/17   Yes Ronnell Freshwater, NP  Blood Glucose Monitoring Suppl (PRODIGY AUTOCODE BLOOD GLUCOSE) DEVI Use as directed 07/27/17  Yes [name], MD  carvedilol (COREG) 25 MG tablet TAKE ONE TABLET BY MOUTH TWICE DAILY 07/13/17  Yes Allyne Gee, MD  diltiazem (CARDIZEM CD) 240 MG 24 hr capsule TAKE 1 CAPSULE BY MOUTH ONCE DAILY 06/21/18  Yes Scarboro, Audie Clear, NP  furosemide (LASIX) 40 MG tablet TAKE 1 TABLET BY MOUTH ONCE DAILY FOR  FLUID.  MAY  TAKE  HALF  EXTRA  IF  NEEDED 11/03/17  Yes [name], MD  glucose blood (PRODIGY NO CODING BLOOD GLUC) test strip 1 each by Other route 3 (three) times daily. Use as instructed 07/27/17  Yes [name], MD  hydrALAZINE (APRESOLINE) 25 MG tablet TAKE 1 & 1/2 (ONE & ONE-HALF) TABLETS BY MOUTH THREE TIMES DAILY FOR BLOOD PRESSURE 06/21/18  Yes Scarboro, Audie Clear, NP  insulin aspart protamine- aspart (NOVOLOG MIX 70/30) (70-30) 100 UNIT/ML injection Inject 26-34 Units into the skin 2 (two) times daily with a meal. 34 units in morning and 26 units in the evening   Yes [provider]  lisinopril (PRINIVIL,ZESTRIL) 20 MG tablet Take 1 tablet (20 mg total) by mouth daily for 30 days. 08/09/18 09/08/18 Yes Pyreddy, Reatha Harps, MD  OXYGEN Inhale into the lungs continuous.   Yes [provider]  pantoprazole (PROTONIX) 40 MG tablet  Take 1 tablet (40 mg total) by mouth daily. 06/09/18  Yes Scarboro, Audie Clear, NP  tiotropium (SPIRIVA) 18 MCG inhalation capsule Place 1 capsule (18 mcg total) into inhaler and inhale daily. 12/06/15  Yes Epifanio Lesches, MD  warfarin (COUMADIN) 5 MG tablet TAKE ONE TABLET BY MOUTH ONCE DAILY AT 5-6PM AND TAKE ONE AND ONE-HALF TABLET BY MOUTH ON THURSDAY Patient taking differently: Take half tab every night at 6 pm. 12/30/17  Yes Ronnell Freshwater, NP    Review of Systems  Constitutional: Negative for appetite change and fatigue.  HENT: Negative for congestion, rhinorrhea and sore throat.   Eyes: Negative.   Respiratory: Positive  for shortness of breath. Negative for cough.   Cardiovascular: Positive for leg swelling (1+ pitting). Negative for chest pain and palpitations.  Gastrointestinal: Negative for abdominal distention and abdominal pain.  Endocrine: Negative.   Genitourinary: Negative.   Musculoskeletal: Negative for back pain and neck pain.  Skin: Positive for wound (left lower leg open wound).  Allergic/Immunologic: Negative.   Neurological: Negative for dizziness and light-headedness.  Hematological: Negative for adenopathy. Does not bruise/bleed easily.  Psychiatric/Behavioral: Negative for dysphoric mood and sleep disturbance (wearing oxygen at 2L around the clock). The patient is not nervous/anxious.     Vitals:   08/11/18 1500  BP: (!) 157/69  Pulse: 87  Resp: 18  SpO2: 90%  Weight: 212 lb 4 oz (96.3 kg)  Height: 5\' 3"  (1.6 m)   Wt Readings from Last 3 Encounters:  08/11/18 212 lb 4 oz (96.3 kg)  08/05/18 208 lb 12.8 oz (94.7 kg)  08/02/18 201 lb (91.2 kg)   Lab Results  Component Value Date   CREATININE 1.55 (H) 08/05/2018   CREATININE 1.44 (H) 08/04/2018   CREATININE 1.43 (H) 08/03/2018    Physical Exam Vitals signs and nursing note reviewed.  Constitutional:      Appearance: She is well-developed.  HENT:     Head: Normocephalic and atraumatic.  Neck:     Musculoskeletal: Normal range of motion and neck supple.     Vascular: No JVD.  Cardiovascular:     Rate and Rhythm: Normal rate and regular rhythm.  Pulmonary:     Effort: Pulmonary effort is normal.     Breath sounds: No wheezing or rales.  Abdominal:     Palpations: Abdomen is soft.     Tenderness: There is no abdominal tenderness.  Musculoskeletal:     Right lower leg: She exhibits no tenderness. Edema (1+ pitting) present.     Left lower leg: She exhibits no tenderness. Edema (1+ pitting) present.  Skin:    General: Skin is warm and dry.  Neurological:     General: No focal deficit present.     Mental Status: She  is alert and oriented to person, place, and time.  Psychiatric:        Mood and Affect: Mood normal.        Behavior: Behavior normal.    Assessment & Plan:  1: Chronic heart failure with preserved ejection fraction- - NYHA class II - euvolemic today - having difficulty weighing because of being unsteady on her feet. Advised her to put her walker over the scale and that she could gently hold the walker for stability. Instructed to call for an overnight weight gain of >2 pounds or a weekly weight gain of >5 pounds - not adding salt and reviewed the importance of closely following a 2000mg  sodium diet. Written dietary information was given  to her about this - saw cardiology [name]) in the past but has to make another appointment with him/ his office number provided to them upon their request - has [facility] coming to the home - BNP 08/02/2018 was 337.0  2: HTN- - BP mildly elevated but she was nervous about coming today - saw PCP Junius Creamer) 07/12/2018 - BMP from 08/05/2018 reviewed and showed sodium 139, potassium 4.3, creatinine 1.55 and GFR 35  3: DM- - glucose at home today was 191 - A1c 06/02/18 was 5.6%  4: COPD- - saw pulmonology Humphrey Rolls) 08/02/2018 - wearing oxygen at 2L around the clock  5: Lymphedema- - stage 2 - unable to wear TED hose as she says that they cut into her legs and she is unable to get them on - encouraged her to elevate her legs when sitting for long periods of time - limited in her ability to exercise due to her being unsteady on her feet - consider lymphapress compression boots if edema persists after above therapies  Medications list was reviewed.  Return in 6 weeks or sooner for any questions/problems before then.

## 2018-08-12 ENCOUNTER — Encounter: Payer: Self-pay | Admitting: Nurse Practitioner

## 2018-08-12 ENCOUNTER — Encounter: Payer: Self-pay | Admitting: Family

## 2018-08-12 DIAGNOSIS — I110 Hypertensive heart disease with heart failure: Secondary | ICD-10-CM | POA: Diagnosis not present

## 2018-08-12 DIAGNOSIS — I5023 Acute on chronic systolic (congestive) heart failure: Secondary | ICD-10-CM | POA: Diagnosis not present

## 2018-08-12 DIAGNOSIS — E669 Obesity, unspecified: Secondary | ICD-10-CM | POA: Diagnosis not present

## 2018-08-12 DIAGNOSIS — Z6836 Body mass index (BMI) 36.0-36.9, adult: Secondary | ICD-10-CM | POA: Diagnosis not present

## 2018-08-12 DIAGNOSIS — J441 Chronic obstructive pulmonary disease with (acute) exacerbation: Secondary | ICD-10-CM | POA: Diagnosis not present

## 2018-08-12 DIAGNOSIS — E119 Type 2 diabetes mellitus without complications: Secondary | ICD-10-CM | POA: Diagnosis not present

## 2018-08-12 DIAGNOSIS — I890 Lymphedema, not elsewhere classified: Secondary | ICD-10-CM | POA: Insufficient documentation

## 2018-08-13 ENCOUNTER — Telehealth: Payer: Self-pay

## 2018-08-13 DIAGNOSIS — I110 Hypertensive heart disease with heart failure: Secondary | ICD-10-CM | POA: Diagnosis not present

## 2018-08-13 DIAGNOSIS — Z6836 Body mass index (BMI) 36.0-36.9, adult: Secondary | ICD-10-CM | POA: Diagnosis not present

## 2018-08-13 DIAGNOSIS — J441 Chronic obstructive pulmonary disease with (acute) exacerbation: Secondary | ICD-10-CM | POA: Diagnosis not present

## 2018-08-13 DIAGNOSIS — E669 Obesity, unspecified: Secondary | ICD-10-CM | POA: Diagnosis not present

## 2018-08-13 DIAGNOSIS — I5023 Acute on chronic systolic (congestive) heart failure: Secondary | ICD-10-CM | POA: Diagnosis not present

## 2018-08-13 DIAGNOSIS — E119 Type 2 diabetes mellitus without complications: Secondary | ICD-10-CM | POA: Diagnosis not present

## 2018-08-13 NOTE — Telephone Encounter (Signed)
CONNIE OT FROM [facility] WANTED VERBAL ORDERS FOR OT 2 TIMES A WEEK FOR 2 WEEKS AND 1 TIMES A WEEK FOR 3 WEEKS.  PER NIMISHA I CALLED AND GAVE VERBAL ORDER.

## 2018-08-16 ENCOUNTER — Ambulatory Visit (INDEPENDENT_AMBULATORY_CARE_PROVIDER_SITE_OTHER): Payer: Medicare Other

## 2018-08-16 DIAGNOSIS — I4891 Unspecified atrial fibrillation: Secondary | ICD-10-CM

## 2018-08-16 DIAGNOSIS — Z7901 Long term (current) use of anticoagulants: Secondary | ICD-10-CM | POA: Diagnosis not present

## 2018-08-16 DIAGNOSIS — J441 Chronic obstructive pulmonary disease with (acute) exacerbation: Secondary | ICD-10-CM | POA: Diagnosis not present

## 2018-08-16 DIAGNOSIS — Z6836 Body mass index (BMI) 36.0-36.9, adult: Secondary | ICD-10-CM | POA: Diagnosis not present

## 2018-08-16 DIAGNOSIS — I5023 Acute on chronic systolic (congestive) heart failure: Secondary | ICD-10-CM | POA: Diagnosis not present

## 2018-08-16 DIAGNOSIS — I110 Hypertensive heart disease with heart failure: Secondary | ICD-10-CM | POA: Diagnosis not present

## 2018-08-16 DIAGNOSIS — E119 Type 2 diabetes mellitus without complications: Secondary | ICD-10-CM | POA: Diagnosis not present

## 2018-08-16 DIAGNOSIS — I4821 Permanent atrial fibrillation: Secondary | ICD-10-CM | POA: Diagnosis not present

## 2018-08-16 DIAGNOSIS — E669 Obesity, unspecified: Secondary | ICD-10-CM | POA: Diagnosis not present

## 2018-08-17 DIAGNOSIS — I110 Hypertensive heart disease with heart failure: Secondary | ICD-10-CM | POA: Diagnosis not present

## 2018-08-17 DIAGNOSIS — E669 Obesity, unspecified: Secondary | ICD-10-CM | POA: Diagnosis not present

## 2018-08-17 DIAGNOSIS — E119 Type 2 diabetes mellitus without complications: Secondary | ICD-10-CM | POA: Diagnosis not present

## 2018-08-17 DIAGNOSIS — J441 Chronic obstructive pulmonary disease with (acute) exacerbation: Secondary | ICD-10-CM | POA: Diagnosis not present

## 2018-08-17 DIAGNOSIS — Z6836 Body mass index (BMI) 36.0-36.9, adult: Secondary | ICD-10-CM | POA: Diagnosis not present

## 2018-08-17 DIAGNOSIS — I5023 Acute on chronic systolic (congestive) heart failure: Secondary | ICD-10-CM | POA: Diagnosis not present

## 2018-08-17 NOTE — Progress Notes (Signed)
Pt Inr was 2.5 continue same as per adam

## 2018-08-18 ENCOUNTER — Encounter: Payer: Self-pay | Admitting: *Deleted

## 2018-08-18 ENCOUNTER — Other Ambulatory Visit: Payer: Self-pay | Admitting: *Deleted

## 2018-08-18 DIAGNOSIS — I5023 Acute on chronic systolic (congestive) heart failure: Secondary | ICD-10-CM | POA: Diagnosis not present

## 2018-08-18 DIAGNOSIS — E669 Obesity, unspecified: Secondary | ICD-10-CM | POA: Diagnosis not present

## 2018-08-18 DIAGNOSIS — I110 Hypertensive heart disease with heart failure: Secondary | ICD-10-CM | POA: Diagnosis not present

## 2018-08-18 DIAGNOSIS — J441 Chronic obstructive pulmonary disease with (acute) exacerbation: Secondary | ICD-10-CM | POA: Diagnosis not present

## 2018-08-18 DIAGNOSIS — Z6836 Body mass index (BMI) 36.0-36.9, adult: Secondary | ICD-10-CM | POA: Diagnosis not present

## 2018-08-18 DIAGNOSIS — E119 Type 2 diabetes mellitus without complications: Secondary | ICD-10-CM | POA: Diagnosis not present

## 2018-08-18 NOTE — Patient Outreach (Addendum)
[place] [facility]) Care Management   08/18/2018  ANDRAYA FRIGON 1929-12-12 [id number]  DAYLYNN STUMPP is an 85 y.o. female  Subjective:   Member alert and oriented x3, denies complaints of pain or discomfort at this time.  Lives with daughter but report she manages her own medications, state she is compliant.    Objective:   Review of Systems  Constitutional: Negative.   HENT: Negative.   Eyes: Negative.   Respiratory: Negative.   Cardiovascular: Positive for leg swelling.  Gastrointestinal: Negative.   Genitourinary: Negative.   Musculoskeletal: Negative.   Skin: Negative.   Neurological: Negative.   Endo/Heme/Allergies: Negative.   Psychiatric/Behavioral: Negative.     Physical Exam  Constitutional: She is oriented to person, place, and time. She appears well-developed and well-nourished.  Neck: Normal range of motion.  Cardiovascular: Normal rate, regular rhythm and normal heart sounds.  Respiratory: Effort normal and breath sounds normal.  GI: Soft. Bowel sounds are normal.  Musculoskeletal: Normal range of motion.  Neurological: She is alert and oriented to person, place, and time.  Skin: Skin is warm and dry.   BP 138/70 (BP Location: Left Arm, Patient Position: Sitting, Cuff Size: Large)   Pulse 72   Resp 20   Ht 1.575 m (5' 2" )   Wt 208 lb (94.3 kg)   SpO2 93%   BMI 38.04 kg/m   Encounter Medications:   Outpatient Encounter Medications as of 08/18/2018  Medication Sig  . aspirin 81 MG tablet Take 81 mg by mouth daily.  Marland Kitchen atorvastatin (LIPITOR) 10 MG tablet TAKE 1 TABLET BY MOUTH ONCE DAILY FOR CHOLESTEROL  . Blood Glucose Monitoring Suppl (PRODIGY AUTOCODE BLOOD GLUCOSE) DEVI Use as directed  . carvedilol (COREG) 25 MG tablet TAKE ONE TABLET BY MOUTH TWICE DAILY  . diltiazem (CARDIZEM CD) 240 MG 24 hr capsule TAKE 1 CAPSULE BY MOUTH ONCE DAILY  . furosemide (LASIX) 40 MG tablet TAKE 1 TABLET BY MOUTH ONCE DAILY FOR  FLUID.  MAY  TAKE  HALF   EXTRA  IF  NEEDED  . glucose blood (PRODIGY NO CODING BLOOD GLUC) test strip 1 each by Other route 3 (three) times daily. Use as instructed  . hydrALAZINE (APRESOLINE) 25 MG tablet TAKE 1 & 1/2 (ONE & ONE-HALF) TABLETS BY MOUTH THREE TIMES DAILY FOR BLOOD PRESSURE  . insulin aspart protamine- aspart (NOVOLOG MIX 70/30) (70-30) 100 UNIT/ML injection Inject 26-34 Units into the skin 2 (two) times daily with a meal. 34 units in morning and 26 units in the evening  . lisinopril (PRINIVIL,ZESTRIL) 20 MG tablet Take 1 tablet (20 mg total) by mouth daily for 30 days.  . OXYGEN Inhale into the lungs continuous.  . pantoprazole (PROTONIX) 40 MG tablet Take 1 tablet (40 mg total) by mouth daily.  Marland Kitchen tiotropium (SPIRIVA) 18 MCG inhalation capsule Place 1 capsule (18 mcg total) into inhaler and inhale daily.  Marland Kitchen warfarin (COUMADIN) 5 MG tablet TAKE ONE TABLET BY MOUTH ONCE DAILY AT 5-6PM AND TAKE ONE AND ONE-HALF TABLET BY MOUTH ON THURSDAY (Patient taking differently: Take half tab every night at 6 pm.)   No facility-administered encounter medications on file as of 08/18/2018.     Functional Status:   In your present state of health, do you have any difficulty performing the following activities: 08/18/2018 08/11/2018  Hearing? N N  Vision? Y Y  Difficulty concentrating or making decisions? N N  Walking or climbing stairs? Y Y  Dressing or bathing? Darreld Mclean  N  Doing errands, shopping? [name]  Preparing Food and eating ? Y -  Using the Toilet? N -  In the past six months, have you accidently leaked urine? Y -  Do you have problems with loss of bowel control? N -  Managing your Medications? Y -  Managing your Finances? Y -  Housekeeping or managing your Housekeeping? Y -  Some recent data might be hidden    Fall/Depression Screening:    Fall Risk  08/18/2018 08/11/2018 06/09/2018  Falls in the past year? 0 0 0  Comment - - -  Number falls in past yr: - 0 -  Injury with Fall? - 0 -   PHQ 2/9 Scores 08/18/2018  06/09/2018 06/02/2018 01/21/2018 12/10/2017 10/13/2017  PHQ - 2 Score 0 0 0 0 0 0    Assessment:    Met with member at scheduled time.  [facility] care management services again explained.  Consent signed.  She lives with daughter, needing assistance with bathing, dressing, and cooking meals.  She is able to independently manage her medications.  Medications reviewed, has all with the exception of Spiriva. Will have pharmacy perform review. Verbalizes understanding of low sodium diet, state daughter adheres to diet when cooking.    Report taking daily weights but state she does not think scale is correct.  Per log, weight this month alone has fluctuated between a low of 190 pounds and 208 pounds.  208 pounds today reflected a 6 pound weight gain overnight according to current scale.  She denies any shortness of breath or increased swelling.  Daughter ordered new scale that arrived today.  They will start using tomorrow.  Member and daughter both deny any urgent concerns.  Provided with this care manager's contact information, advised to contact with questions.  Plan:   Will follow up with member/daughter within the next month. Will place referral to [facility] [place].  [facility] CM Care Plan Problem One     Most Recent Value  Care Plan Problem One  Risk for hospitalization related to heart failure complications as evidenced by recent hospital admission  Role Documenting the Problem One  Care Management [place] for Problem One  Active  [facility] Long Term Goal   Member will not be readmitted to hospital within the next 45 days.  [facility] Long Term Goal Start Date  08/06/18  Interventions for Problem One Long Term Goal  Member educated on heart failure zones/action plan and when to contact MD.  [facility] CM Short Term Goal #1   Member will take medications as prescribed over the next 4 weeks  [facility] CM Short Term Goal #1 Start Date  08/06/18  Interventions for Short Term Goal #1  Medications reviewed in the home,  educated member on purpose and instruction of each medication  [facility] CM Short Term Goal #2   Member will weigh self daily over the next 4 weeks  [facility] CM Short Term Goal #2 Start Date  08/06/18  Interventions for Short Term Goal #2  Reviewed weight logs with member and daughter.  Confirmed new scale has been delivered, advised of importance of using and monitoring trends using new scale only.     Valente David, [place], MSN [place] [phone]

## 2018-08-20 DIAGNOSIS — Z6836 Body mass index (BMI) 36.0-36.9, adult: Secondary | ICD-10-CM | POA: Diagnosis not present

## 2018-08-20 DIAGNOSIS — E669 Obesity, unspecified: Secondary | ICD-10-CM | POA: Diagnosis not present

## 2018-08-20 DIAGNOSIS — I110 Hypertensive heart disease with heart failure: Secondary | ICD-10-CM | POA: Diagnosis not present

## 2018-08-20 DIAGNOSIS — J441 Chronic obstructive pulmonary disease with (acute) exacerbation: Secondary | ICD-10-CM | POA: Diagnosis not present

## 2018-08-20 DIAGNOSIS — E119 Type 2 diabetes mellitus without complications: Secondary | ICD-10-CM | POA: Diagnosis not present

## 2018-08-20 DIAGNOSIS — I5023 Acute on chronic systolic (congestive) heart failure: Secondary | ICD-10-CM | POA: Diagnosis not present

## 2018-08-20 NOTE — Addendum Note (Signed)
Addended byValente David on: 08/20/2018 04:02 PM   Modules accepted: Orders

## 2018-08-23 ENCOUNTER — Other Ambulatory Visit: Payer: Self-pay | Admitting: Pharmacist

## 2018-08-23 ENCOUNTER — Ambulatory Visit: Payer: Self-pay | Admitting: Pharmacist

## 2018-08-23 ENCOUNTER — Ambulatory Visit (INDEPENDENT_AMBULATORY_CARE_PROVIDER_SITE_OTHER): Payer: Medicare Other

## 2018-08-23 DIAGNOSIS — Z6836 Body mass index (BMI) 36.0-36.9, adult: Secondary | ICD-10-CM | POA: Diagnosis not present

## 2018-08-23 DIAGNOSIS — E119 Type 2 diabetes mellitus without complications: Secondary | ICD-10-CM | POA: Diagnosis not present

## 2018-08-23 DIAGNOSIS — I5023 Acute on chronic systolic (congestive) heart failure: Secondary | ICD-10-CM | POA: Diagnosis not present

## 2018-08-23 DIAGNOSIS — J441 Chronic obstructive pulmonary disease with (acute) exacerbation: Secondary | ICD-10-CM | POA: Diagnosis not present

## 2018-08-23 DIAGNOSIS — E669 Obesity, unspecified: Secondary | ICD-10-CM | POA: Diagnosis not present

## 2018-08-23 DIAGNOSIS — I4891 Unspecified atrial fibrillation: Secondary | ICD-10-CM

## 2018-08-23 DIAGNOSIS — I110 Hypertensive heart disease with heart failure: Secondary | ICD-10-CM | POA: Diagnosis not present

## 2018-08-23 NOTE — Progress Notes (Signed)
Pt inr 2.5 continue same med  

## 2018-08-24 DIAGNOSIS — I110 Hypertensive heart disease with heart failure: Secondary | ICD-10-CM | POA: Diagnosis not present

## 2018-08-24 DIAGNOSIS — Z6836 Body mass index (BMI) 36.0-36.9, adult: Secondary | ICD-10-CM | POA: Diagnosis not present

## 2018-08-24 DIAGNOSIS — E119 Type 2 diabetes mellitus without complications: Secondary | ICD-10-CM | POA: Diagnosis not present

## 2018-08-24 DIAGNOSIS — E669 Obesity, unspecified: Secondary | ICD-10-CM | POA: Diagnosis not present

## 2018-08-24 DIAGNOSIS — J441 Chronic obstructive pulmonary disease with (acute) exacerbation: Secondary | ICD-10-CM | POA: Diagnosis not present

## 2018-08-24 DIAGNOSIS — I5023 Acute on chronic systolic (congestive) heart failure: Secondary | ICD-10-CM | POA: Diagnosis not present

## 2018-08-24 NOTE — Patient Outreach (Signed)
[facility]) Care Management  [place]   08/24/2018  Kimberly Peterson Jan 14, 1930 [id number]  Reason for referral: Medication Assistance with Spiriva  Referral source: [place]  PMHx includes but not limited to:  Afib, h/o PE on AC (INR 2.5), DMT2, HLD, HTN, GERD, CHF  Outreach:  Successful telephone call with Ms. [name].  HIPAA identifiers verified.  Patient unable to review medications at this time.  States that she does in fact need Spiriva medication assistance.  Will mail patient PAP for Spiriva.  Unsure why patient isn't on rescue inhaler or nebs.  Encouraged patient to allow daughter to help her fill out PAP application for Spiriva. Of note, patient's most recent INR was 2.5 (goal 2-3) at goal.  She denies SOB, remains on O2 and lasix for SX control for CHF.   Objective: Lab Results  Component Value Date   CREATININE 1.55 (H) 08/05/2018   CREATININE 1.44 (H) 08/04/2018   CREATININE 1.43 (H) 08/03/2018    Lab Results  Component Value Date   HGBA1C 5.6 06/02/2018    Lipid Panel     Component Value Date/Time   CHOL 169 09/28/2017 0928   CHOL 160 08/18/2013 0041   TRIG 128 09/28/2017 0928   TRIG 80 08/18/2013 0041   HDL 56 09/28/2017 0928   HDL 68 (H) 08/18/2013 0041   VLDL 16 08/18/2013 0041   LDLCALC 87 09/28/2017 0928   LDLCALC 76 08/18/2013 0041    BP Readings from Last 3 Encounters:  08/18/18 138/70  08/11/18 (!) 157/69  08/05/18 (!) 138/54    No Known Allergies  Medications Reviewed Today    Reviewed by Valente David, RN (Registered Nurse) on 08/18/18 at 37  Med List Status: <None>  Medication Order Taking? Sig Documenting Provider Last Dose Status Informant  aspirin 81 MG tablet [id number] Yes Take 81 mg by mouth daily. [provider] Taking Active Self  atorvastatin (LIPITOR) 10 MG tablet [id number] Yes TAKE 1 TABLET BY MOUTH ONCE DAILY FOR CHOLESTEROL Ronnell Freshwater, NP Taking  Active Self  Blood Glucose Monitoring Suppl (PRODIGY AUTOCODE BLOOD GLUCOSE) DEVI [id number] Yes Use as directed [name], MD Taking Active Self  carvedilol (COREG) 25 MG tablet [id number] Yes TAKE ONE TABLET BY MOUTH TWICE DAILY Allyne Gee, MD Taking Active Self  diltiazem (CARDIZEM CD) 240 MG 24 hr capsule [id number] Yes TAKE 1 CAPSULE BY MOUTH ONCE DAILY Kendell Bane, NP Taking Active Self  furosemide (LASIX) 40 MG tablet [id number] Yes TAKE 1 TABLET BY MOUTH ONCE DAILY FOR  FLUID.  MAY  TAKE  HALF  EXTRA  IF  NEEDED [name], MD Taking Active Self  glucose blood (PRODIGY NO CODING BLOOD GLUC) test strip [id number] Yes 1 each by Other route 3 (three) times daily. Use as instructed [name], MD Taking Active Self  hydrALAZINE (APRESOLINE) 25 MG tablet [id number] Yes TAKE 1 & 1/2 (ONE & ONE-HALF) TABLETS BY MOUTH THREE TIMES DAILY FOR BLOOD PRESSURE [place], Audie Clear, NP Taking Active Self  insulin aspart protamine- aspart (NOVOLOG MIX 70/30) (70-30) 100 UNIT/ML injection [id number] Yes Inject 26-34 Units into the skin 2 (two) times daily with a meal. 34 units in morning and 26 units in the evening [provider] Taking Active Self           Med Note [name], [name]   Mon Aug 02, 2018  4:30 PM)    lisinopril (PRINIVIL,ZESTRIL) 20 MG  tablet [id number] Yes Take 1 tablet (20 mg total) by mouth daily for 30 days. Saundra Shelling, MD Taking Active   OXYGEN [id number] Yes Inhale into the lungs continuous. [provider] Taking Active Self  pantoprazole (PROTONIX) 40 MG tablet [id number] Yes Take 1 tablet (40 mg total) by mouth daily. Kendell Bane, NP Taking Active Self  tiotropium [facility]) 18 MCG inhalation capsule [id number] No Place 1 capsule (18 mcg total) into inhaler and inhale daily.  Patient not taking:  Reported on 08/18/2018   Epifanio Lesches, MD Not Taking Active Self  warfarin (COUMADIN) 5 MG tablet [id number] Yes TAKE ONE TABLET BY MOUTH ONCE DAILY AT  5-6PM AND TAKE ONE AND ONE-HALF TABLET BY MOUTH ON THURSDAY  Patient taking differently:  Take half tab every night at 6 pm.   Ronnell Freshwater, NP Taking Active Self          Assessment:  Drugs sorted by system:  Neurologic/Psychologic:  Cardiovascular: warfarin, ASA, lisinopril, hydralazine, atorvastatin, carvedilol, diltiazem, furosemide  Pulmonary/Allergy: tiotropium  Gastrointestinal: pantoprazole  Endocrine: Novolog 70/30  Medication Review Findings:  Patient unable to review medications at this time.  Medication list obtained via [facility]/MD notes.  Will f/u to see if patient needs rescue inhaler of nebs.  Medication Assistance Findings:   Extra Help:   []  Already receiving Full Extra Help  []  Already receiving Partial Extra Help  []  Eligible based on reported income and assets  [x]  Not Eligible based on reported income and assets  Patient Assistance Programs: 1) Spiriva made by [employer] o Income requirement met: [x]  Yes []  No []  Unknown o Out-of-pocket prescription expenditure met:    []  Yes []  No  []  Unknown  [x]  Not applicable   Plan: -I will route patient assistance letter to [place] technician who will coordinate patient assistance program application process for medications listed above.  [facility] pharmacy technician will assist with obtaining all required documents from both patient and provider(s) and submit application(s) once completed.  -I will f/u with patient in 2 weeks  Regina Eck, PharmD, [place]  [phone]

## 2018-08-25 ENCOUNTER — Other Ambulatory Visit: Payer: Self-pay | Admitting: Pharmacy Technician

## 2018-08-25 DIAGNOSIS — I5023 Acute on chronic systolic (congestive) heart failure: Secondary | ICD-10-CM | POA: Diagnosis not present

## 2018-08-25 DIAGNOSIS — E669 Obesity, unspecified: Secondary | ICD-10-CM | POA: Diagnosis not present

## 2018-08-25 DIAGNOSIS — I110 Hypertensive heart disease with heart failure: Secondary | ICD-10-CM | POA: Diagnosis not present

## 2018-08-25 DIAGNOSIS — E119 Type 2 diabetes mellitus without complications: Secondary | ICD-10-CM | POA: Diagnosis not present

## 2018-08-25 DIAGNOSIS — J441 Chronic obstructive pulmonary disease with (acute) exacerbation: Secondary | ICD-10-CM | POA: Diagnosis not present

## 2018-08-25 DIAGNOSIS — Z6836 Body mass index (BMI) 36.0-36.9, adult: Secondary | ICD-10-CM | POA: Diagnosis not present

## 2018-08-25 NOTE — Patient Outreach (Signed)
[facility]) Care Management  08/25/2018  Kimberly Peterson 30-Nov-1929 [id number]                                                  Medication Assistance Referral  Referral From: [facility] RPh Jenne Pane  Medication/Company: [employer] Patient application portion:  Mailed Provider application portion: Faxed  to Dr. Posey Pronto   Follow up:  Will follow up with patient in 5-7 business days to confirm application(s) have been received.  Maud Deed Chana Bode [place] Certified Pharmacy Technician [place] Management Direct Dial:[phone]

## 2018-08-26 DIAGNOSIS — I5023 Acute on chronic systolic (congestive) heart failure: Secondary | ICD-10-CM | POA: Diagnosis not present

## 2018-08-26 DIAGNOSIS — Z6836 Body mass index (BMI) 36.0-36.9, adult: Secondary | ICD-10-CM | POA: Diagnosis not present

## 2018-08-26 DIAGNOSIS — J441 Chronic obstructive pulmonary disease with (acute) exacerbation: Secondary | ICD-10-CM | POA: Diagnosis not present

## 2018-08-26 DIAGNOSIS — I110 Hypertensive heart disease with heart failure: Secondary | ICD-10-CM | POA: Diagnosis not present

## 2018-08-26 DIAGNOSIS — E669 Obesity, unspecified: Secondary | ICD-10-CM | POA: Diagnosis not present

## 2018-08-26 DIAGNOSIS — E119 Type 2 diabetes mellitus without complications: Secondary | ICD-10-CM | POA: Diagnosis not present

## 2018-08-28 DIAGNOSIS — I110 Hypertensive heart disease with heart failure: Secondary | ICD-10-CM | POA: Diagnosis not present

## 2018-08-28 DIAGNOSIS — E119 Type 2 diabetes mellitus without complications: Secondary | ICD-10-CM | POA: Diagnosis not present

## 2018-08-28 DIAGNOSIS — Z6836 Body mass index (BMI) 36.0-36.9, adult: Secondary | ICD-10-CM | POA: Diagnosis not present

## 2018-08-28 DIAGNOSIS — J441 Chronic obstructive pulmonary disease with (acute) exacerbation: Secondary | ICD-10-CM | POA: Diagnosis not present

## 2018-08-28 DIAGNOSIS — I5023 Acute on chronic systolic (congestive) heart failure: Secondary | ICD-10-CM | POA: Diagnosis not present

## 2018-08-28 DIAGNOSIS — E669 Obesity, unspecified: Secondary | ICD-10-CM | POA: Diagnosis not present

## 2018-08-30 ENCOUNTER — Encounter: Payer: Self-pay | Admitting: Internal Medicine

## 2018-08-30 ENCOUNTER — Ambulatory Visit (INDEPENDENT_AMBULATORY_CARE_PROVIDER_SITE_OTHER): Payer: Medicare Other | Admitting: Internal Medicine

## 2018-08-30 VITALS — BP 148/82 | HR 71 | Resp 18 | Ht 62.0 in | Wt 200.0 lb

## 2018-08-30 DIAGNOSIS — J449 Chronic obstructive pulmonary disease, unspecified: Secondary | ICD-10-CM

## 2018-08-30 DIAGNOSIS — I10 Essential (primary) hypertension: Secondary | ICD-10-CM

## 2018-08-30 DIAGNOSIS — I5032 Chronic diastolic (congestive) heart failure: Secondary | ICD-10-CM | POA: Diagnosis not present

## 2018-08-30 DIAGNOSIS — Z9981 Dependence on supplemental oxygen: Secondary | ICD-10-CM

## 2018-08-30 DIAGNOSIS — J9621 Acute and chronic respiratory failure with hypoxia: Secondary | ICD-10-CM | POA: Diagnosis not present

## 2018-08-30 DIAGNOSIS — R600 Localized edema: Secondary | ICD-10-CM | POA: Diagnosis not present

## 2018-08-30 NOTE — Progress Notes (Signed)
[facility] [place], [place] [address]  Pulmonary Sleep Medicine   Office Visit Note  Patient Name: Kimberly Peterson DOB: [birth year] MRN [id number]  Date of Service: 08/30/2018  Complaints/HPI: Patient is here for hospital follow-up.  She was hospitalized for days starting on February 1 for CHF and fluid overload.  She stayed in the hospital for 4 days and was discharged on February 5.  She arrived today showing an oxygen saturation on room air of 87%.  It returned to her normal 91% on 2 L of oxygen in the exam room.  Patient reports she is feeling better and has been taking lisinopril which was added to her medications in the hospital.  She has a log of her blood pressures here at this visit and they see no controlled at this time.  She does continue to have some lower extremity swelling and some small blistering spread diffusely around her lower extremities.  ROS  General: (-) fever, (-) chills, (-) night sweats, (-) weakness Skin: (-) rashes, (-) itching,. Eyes: (-) visual changes, (-) redness, (-) itching. Nose and Sinuses: (-) nasal stuffiness or itchiness, (-) postnasal drip, (-) nosebleeds, (-) sinus trouble. Mouth and Throat: (-) sore throat, (-) hoarseness. Neck: (-) swollen glands, (-) enlarged thyroid, (-) neck pain. Respiratory: - cough, (-) bloody sputum, - shortness of breath, - wheezing. Cardiovascular: + ankle swelling, (-) chest pain. Lymphatic: (-) lymph node enlargement. Neurologic: (-) numbness, (-) tingling. Psychiatric: (-) anxiety, (-) depression   Current Medication: Outpatient Encounter Medications as of 08/30/2018  Medication Sig  . aspirin 81 MG tablet Take 81 mg by mouth daily.  Marland Kitchen atorvastatin (LIPITOR) 10 MG tablet TAKE 1 TABLET BY MOUTH ONCE DAILY FOR CHOLESTEROL  . Blood Glucose Monitoring Suppl (PRODIGY AUTOCODE BLOOD GLUCOSE) DEVI Use as directed  . diltiazem (CARDIZEM CD) 240 MG 24 hr capsule TAKE 1 CAPSULE BY MOUTH ONCE  DAILY  . furosemide (LASIX) 40 MG tablet TAKE 1 TABLET BY MOUTH ONCE DAILY FOR  FLUID.  MAY  TAKE  HALF  EXTRA  IF  NEEDED  . glucose blood (PRODIGY NO CODING BLOOD GLUC) test strip 1 each by Other route 3 (three) times daily. Use as instructed  . hydrALAZINE (APRESOLINE) 25 MG tablet TAKE 1 & 1/2 (ONE & ONE-HALF) TABLETS BY MOUTH THREE TIMES DAILY FOR BLOOD PRESSURE  . insulin aspart protamine- aspart (NOVOLOG MIX 70/30) (70-30) 100 UNIT/ML injection Inject 26-34 Units into the skin 2 (two) times daily with a meal. 34 units in morning and 26 units in the evening  . lisinopril (PRINIVIL,ZESTRIL) 20 MG tablet Take 1 tablet (20 mg total) by mouth daily for 30 days.  . OXYGEN Inhale into the lungs continuous.  . pantoprazole (PROTONIX) 40 MG tablet Take 1 tablet (40 mg total) by mouth daily.  Marland Kitchen warfarin (COUMADIN) 5 MG tablet TAKE ONE TABLET BY MOUTH ONCE DAILY AT 5-6PM AND TAKE ONE AND ONE-HALF TABLET BY MOUTH ON THURSDAY (Patient taking differently: Take half tab every night at 6 pm.)  . carvedilol (COREG) 25 MG tablet TAKE ONE TABLET BY MOUTH TWICE DAILY  . tiotropium (SPIRIVA) 18 MCG inhalation capsule Place 1 capsule (18 mcg total) into inhaler and inhale daily. (Patient not taking: Reported on 08/18/2018)   No facility-administered encounter medications on file as of 08/30/2018.     Surgical History: Past Surgical History:  Procedure Laterality Date  . aoric valve replacemet      [employer]  . CATARACT EXTRACTION,  BILATERAL    . PACEMAKER PLACEMENT    . VALVE REPLACEMENT      Medical History: Past Medical History:  Diagnosis Date  . CHF (congestive heart failure) ([place])   . Chronic kidney disease   . COPD (chronic obstructive pulmonary disease) ([place])   . Diabetes mellitus without complication ([place])   . Hypertension     Family History: History reviewed. No pertinent family history.  Social History: Social History   Socioeconomic History  . Marital status: Married    Spouse  name: Not on file  . Number of children: Not on file  . Years of education: Not on file  . Highest education level: Not on file  Occupational History  . Not on file  Social Needs  . Financial resource strain: Not on file  . Food insecurity:    Worry: Not on file    Inability: Not on file  . Transportation needs:    Medical: Not on file    Non-medical: Not on file  Tobacco Use  . Smoking status: Never Smoker  . Smokeless tobacco: Never Used  Substance and Sexual Activity  . Alcohol use: No  . Drug use: No  . Sexual activity: Never  Lifestyle  . Physical activity:    Days per week: Not on file    Minutes per session: Not on file  . Stress: Not on file  Relationships  . Social connections:    Talks on phone: Not on file    Gets together: Not on file    Attends religious service: Not on file    Active member of club or organization: Not on file    Attends meetings of clubs or organizations: Not on file    Relationship status: Not on file  . Intimate partner violence:    Fear of current or ex partner: Not on file    Emotionally abused: Not on file    Physically abused: Not on file    Forced sexual activity: Not on file  Other Topics Concern  . Not on file  Social History Narrative  . Not on file    Vital Signs: Blood pressure (!) 148/82, pulse 71, resp. rate 18, height 5\' 2"  (1.575 m), weight 200 lb (90.7 kg), SpO2 (!) 87 %.  Examination: General Appearance: The patient is well-developed, well-nourished, and in no distress. Skin: Gross inspection of skin unremarkable. Head: normocephalic, no gross deformities. Eyes: no gross deformities noted. ENT: ears appear grossly normal no exudates. Neck: Supple. No thyromegaly. No LAD. Respiratory: clear/diminished. Cardiovascular: Normal S1 and S2 without murmur or rub. Extremities: No cyanosis. pulses are equal. Neurologic: Alert and oriented. No involuntary movements.  LABS: Recent Results (from the past 2160 hour(s))   POCT INR     Status: Abnormal   Collection Time: 06/02/18 11:32 AM  Result Value Ref Range   INR 5.0 (A) 2.0 - 3.0    Comment: pt 60.0 inr 5.0  POCT HgB A1C     Status: None   Collection Time: 06/02/18 11:54 AM  Result Value Ref Range   Hemoglobin A1C 5.6 4.0 - 5.6 %   HbA1c POC (<> result, manual entry)     HbA1c, POC (prediabetic range)     HbA1c, POC (controlled diabetic range)    POCT INR     Status: Abnormal   Collection Time: 06/09/18  2:33 PM  Result Value Ref Range   INR 1.2 (A) 2.0 - 3.0    Comment: PT 14.3, take a  half tab every night   UA/M w/rflx Culture, Routine     Status: None   Collection Time: 06/09/18  3:02 PM  Result Value Ref Range   Specific Gravity, UA 1.007 1.005 - 1.030   pH, UA 7.5 5.0 - 7.5   Color, UA Yellow Yellow   Appearance Ur Clear Clear   Leukocytes, UA Negative Negative   Protein, UA Negative Negative/Trace   Glucose, UA Negative Negative   Ketones, UA Negative Negative   RBC, UA Negative Negative   Bilirubin, UA Negative Negative   Urobilinogen, Ur 1.0 0.2 - 1.0 mg/dL   Nitrite, UA Negative Negative   Microscopic Examination Comment     Comment: Microscopic follows if indicated.   Microscopic Examination See below:     Comment: Microscopic was indicated and was performed.   Urinalysis Reflex Comment     Comment: This specimen will not reflex to a Urine Culture.  Microscopic Examination     Status: Abnormal   Collection Time: 06/09/18  3:02 PM  Result Value Ref Range   WBC, UA 0-5 0 - 5 /hpf   RBC, UA 0-2 0 - 2 /hpf   Epithelial Cells (non renal) 0-10 0 - 10 /hpf   Casts Present (A) None seen /lpf   Cast Type Hyaline casts N/A   Mucus, UA Present Not Estab.   Bacteria, UA None seen None seen/Few  Protime-INR     Status: Abnormal   Collection Time: 06/23/18 12:00 AM  Result Value Ref Range   INR 1.4 (A) 0.9 - 1.1  Protime-INR     Status: None   Collection Time: 06/28/18 12:00 AM  Result Value Ref Range   INR 1.1 0.9 - 1.1   Protime-INR     Status: Abnormal   Collection Time: 07/05/18 12:00 AM  Result Value Ref Range   INR 1.6 (A) 0.9 - 1.1  Protime-INR     Status: Abnormal   Collection Time: 07/12/18 12:00 AM  Result Value Ref Range   INR 1.8 (A) 0.9 - 1.1  Protime-INR     Status: Abnormal   Collection Time: 07/19/18 12:00 AM  Result Value Ref Range   INR 2.1 (A) 0.9 - 1.1  Protime-INR     Status: Abnormal   Collection Time: 07/26/18 12:00 AM  Result Value Ref Range   INR 2.3 (A) 0.9 - 1.1  Basic metabolic panel     Status: Abnormal   Collection Time: 08/02/18  3:03 PM  Result Value Ref Range   Sodium 138 135 - 145 mmol/L   Potassium 4.0 3.5 - 5.1 mmol/L   Chloride 101 98 - 111 mmol/L   CO2 27 22 - 32 mmol/L   Glucose, Bld 162 (H) 70 - 99 mg/dL   BUN 20 8 - 23 mg/dL   Creatinine, Ser 0.94 0.44 - 1.00 mg/dL   Calcium 9.1 8.9 - 10.3 mg/dL   GFR calc non Af Amer 55 (L) >60 mL/min   GFR calc Af Amer >60 >60 mL/min   Anion gap 10 5 - 15    Comment: Performed at [facility], [address] [place], [place] [address]  CBC     Status: Abnormal   Collection Time: 08/02/18  3:03 PM  Result Value Ref Range   WBC 7.8 4.0 - 10.5 K/uL   RBC 4.72 3.87 - 5.11 MIL/uL   Hemoglobin 12.2 12.0 - 15.0 g/dL   HCT 40.6 36.0 - 46.0 %   MCV 86.0 80.0 -  100.0 fL   MCH 25.8 (L) 26.0 - 34.0 pg   MCHC 30.0 30.0 - 36.0 g/dL   RDW 15.6 (H) 11.5 - 15.5 %   Platelets 254 150 - 400 K/uL   nRBC 0.0 0.0 - 0.2 %    Comment: Performed at [facility], [address] [place], [place] [address]  Troponin I - Once     Status: Abnormal   Collection Time: 08/02/18  3:03 PM  Result Value Ref Range   Troponin I 0.07 (HH) <0.03 ng/mL    Comment: CRITICAL RESULT CALLED TO, READ BACK BY AND VERIFIED WITH GEORGIE HAHLE ON 08/02/2018 AT 20 QSD Performed at [facility], [address], [place], [place] [address]   Brain natriuretic peptide     Status: Abnormal   Collection Time: 08/02/18  3:03 PM   Result Value Ref Range   B Natriuretic Peptide 337.0 (H) 0.0 - 100.0 pg/mL    Comment: Performed at [facility], [address] [place], [place] [address]  Influenza panel by PCR (type A & B)     Status: None   Collection Time: 08/02/18  3:03 PM  Result Value Ref Range   Influenza A By PCR NEGATIVE NEGATIVE   Influenza B By PCR NEGATIVE NEGATIVE    Comment: (NOTE) The Xpert Xpress Flu assay is intended as an aid in the diagnosis of  influenza and should not be used as a sole basis for treatment.  This  assay is FDA approved for nasopharyngeal swab specimens only. Nasal  washings and aspirates are unacceptable for Xpert Xpress Flu testing. Performed at [facility], [address] [place], [place] [address]   Glucose, capillary     Status: Abnormal   Collection Time: 08/02/18  5:56 PM  Result Value Ref Range   Glucose-Capillary 169 (H) 70 - 99 mg/dL  Glucose, capillary     Status: Abnormal   Collection Time: 08/02/18  8:38 PM  Result Value Ref Range   Glucose-Capillary 136 (H) 70 - 99 mg/dL  Basic metabolic panel     Status: Abnormal   Collection Time: 08/03/18  5:13 AM  Result Value Ref Range   Sodium 138 135 - 145 mmol/L   Potassium 3.8 3.5 - 5.1 mmol/L   Chloride 99 98 - 111 mmol/L   CO2 33 (H) 22 - 32 mmol/L   Glucose, Bld 58 (L) 70 - 99 mg/dL   BUN 32 (H) 8 - 23 mg/dL   Creatinine, Ser 1.43 (H) 0.44 - 1.00 mg/dL   Calcium 8.9 8.9 - 10.3 mg/dL   GFR calc non Af Amer 33 (L) >60 mL/min   GFR calc Af Amer 38 (L) >60 mL/min   Anion gap 6 5 - 15    Comment: Performed at [facility], [address] [place], [place] [address]  Glucose, capillary     Status: Abnormal   Collection Time: 08/03/18  7:35 AM  Result Value Ref Range   Glucose-Capillary 67 (L) 70 - 99 mg/dL  Glucose, capillary     Status: Abnormal   Collection Time: 08/03/18  8:27 AM  Result Value Ref Range   Glucose-Capillary 114 (H) 70 - 99 mg/dL  ECHOCARDIOGRAM COMPLETE      Status: None   Collection Time: 08/03/18  9:06 AM  Result Value Ref Range   Weight 3,241.6 oz   Height 62 in   BP 123/60 mmHg  Glucose, capillary     Status: Abnormal  Collection Time: 08/03/18 11:40 AM  Result Value Ref Range   Glucose-Capillary 131 (H) 70 - 99 mg/dL  Glucose, capillary     Status: Abnormal   Collection Time: 08/03/18  4:46 PM  Result Value Ref Range   Glucose-Capillary 134 (H) 70 - 99 mg/dL  Glucose, capillary     Status: Abnormal   Collection Time: 08/03/18  8:55 PM  Result Value Ref Range   Glucose-Capillary 182 (H) 70 - 99 mg/dL  Basic metabolic panel     Status: Abnormal   Collection Time: 08/04/18  4:51 AM  Result Value Ref Range   Sodium 137 135 - 145 mmol/L   Potassium 3.9 3.5 - 5.1 mmol/L   Chloride 98 98 - 111 mmol/L   CO2 33 (H) 22 - 32 mmol/L   Glucose, Bld 181 (H) 70 - 99 mg/dL   BUN 39 (H) 8 - 23 mg/dL   Creatinine, Ser 1.44 (H) 0.44 - 1.00 mg/dL   Calcium 8.7 (L) 8.9 - 10.3 mg/dL   GFR calc non Af Amer 33 (L) >60 mL/min   GFR calc Af Amer 38 (L) >60 mL/min   Anion gap 6 5 - 15    Comment: Performed at [facility], [address] [place], [place] [address]  Glucose, capillary     Status: Abnormal   Collection Time: 08/04/18  7:34 AM  Result Value Ref Range   Glucose-Capillary 158 (H) 70 - 99 mg/dL   Comment 1 Notify RN   Glucose, capillary     Status: Abnormal   Collection Time: 08/04/18 11:56 AM  Result Value Ref Range   Glucose-Capillary 202 (H) 70 - 99 mg/dL   Comment 1 Notify RN   Glucose, capillary     Status: Abnormal   Collection Time: 08/04/18  4:30 PM  Result Value Ref Range   Glucose-Capillary 239 (H) 70 - 99 mg/dL   Comment 1 Notify RN   Glucose, capillary     Status: Abnormal   Collection Time: 08/04/18  9:22 PM  Result Value Ref Range   Glucose-Capillary 185 (H) 70 - 99 mg/dL  Basic metabolic panel     Status: Abnormal   Collection Time: 08/05/18  4:20 AM  Result Value Ref Range   Sodium 139 135 - 145  mmol/L   Potassium 4.3 3.5 - 5.1 mmol/L   Chloride 98 98 - 111 mmol/L   CO2 34 (H) 22 - 32 mmol/L   Glucose, Bld 228 (H) 70 - 99 mg/dL   BUN 42 (H) 8 - 23 mg/dL   Creatinine, Ser 1.55 (H) 0.44 - 1.00 mg/dL   Calcium 8.4 (L) 8.9 - 10.3 mg/dL   GFR calc non Af Amer 30 (L) >60 mL/min   GFR calc Af Amer 35 (L) >60 mL/min   Anion gap 7 5 - 15    Comment: Performed at [facility], [address] [place], [place] [address]  Protime-INR     Status: Abnormal   Collection Time: 08/05/18  4:20 AM  Result Value Ref Range   Prothrombin Time 19.2 (H) 11.4 - 15.2 seconds   INR 1.64     Comment: Performed at [facility], [address] [place], [place] [address]  Glucose, capillary     Status: Abnormal   Collection Time: 08/05/18  8:13 AM  Result Value Ref Range   Glucose-Capillary 194 (H) 70 - 99 mg/dL  Glucose, capillary     Status: Abnormal   Collection Time:  08/05/18 11:40 AM  Result Value Ref Range   Glucose-Capillary 281 (H) 70 - 99 mg/dL    Radiology: No results found.  No results found.  Dg Chest Portable 1 View  Result Date: 08/02/2018 CLINICAL DATA:  Shortness of breath.  Hypoxia.  COPD. EXAM: PORTABLE CHEST 1 VIEW COMPARISON:  12/03/2015 and 09/19/2015 FINDINGS: Cardiomegaly with mild pulmonary vascular congestion and slight bilateral interstitial accentuation. No discrete effusions. Pacemaker in place. Aortic valve replacement. Aortic atherosclerosis. IMPRESSION: Mild pulmonary vascular congestion and mild bilateral pulmonary edema. Mild cardiomegaly even considering the portable technique. Aortic Atherosclerosis (ICD10-I70.0). Electronically Signed   By: Lorriane Shire M.D.   On: 08/02/2018 15:08      Assessment and Plan: Patient Active Problem List   Diagnosis Date Noted  . Lymphedema 08/12/2018  . Uncontrolled type 2 diabetes mellitus with hyperglycemia ([place]) 02/25/2018  . Atrial fibrillation ([place]) 02/25/2018  . Encounter for therapeutic drug  monitoring 02/25/2018  . Dependence on supplemental oxygen 02/25/2018  . Lower extremity edema 02/25/2018  . CHF (congestive heart failure) ([place]) 12/04/2015  . Diabetes mellitus ([place]) 12/04/2015  . Chronic obstructive pulmonary disease ([place]) 09/20/2015  . Diastolic CHF, acute on chronic (HCC) 09/19/2015  . Benign essential HTN 09/19/2015  . Controlled diabetes mellitus without complication, with long-term current use of insulin ([place]) 09/19/2015  . Hyperlipidemia, mixed 09/04/2014  . Pulmonary embolism ([place]) 09/23/2013    1. Chronic diastolic congestive heart failure ([place]) Patient will continue current regimen of diuretics and antihypertensive agents.  Follow-up with cardiology as per normal.  2. Obstructive chronic bronchitis without exacerbation ([place]) Patient will continue to use inhalers and other medications.  She will also continue to use her oxygen.  3. Benign essential HTN BP slightly elevated today 148/82.  Continue current medication regimen as prescribed.  Continue to follow at future visit.  4. Acute on chronic respiratory failure with hypoxia [facility]) Patient will continue to use medications and oxygen as prescribed.  She will still require her continuous oxygen at 2 L/min via nasal cannula by 4 hours a day.  5. Dependence on supplemental oxygen Continue to use 2 L via nasal cannula continuously.  6. Lower extremity edema Patient's lower extreme edema is improving since hospital visit.  She still has some tiny diffuse blistering however she reports this is getting better and she has no pain or weeping in the lower extremities as time.  General Counseling: I have discussed the findings of the evaluation and examination with Rhylynn.  I have also discussed any further diagnostic evaluation thatmay be needed or ordered today. Artrice verbalizes understanding of the findings of todays visit. We also reviewed her medications today and discussed drug interactions and side effects  including but not limited excessive drowsiness and altered mental states. We also discussed that there is always a risk not just to her but also people around her. she has been encouraged to call the office with any questions or concerns that should arise related to todays visit.    Time spent: 30 This patient was seen by Orson Gear AGNP-C in Collaboration with Dr. Devona Konig as a part of collaborative care agreement.   I have personally obtained a history, examined the patient, evaluated laboratory and imaging results, formulated the assessment and plan and placed orders.    Allyne Gee, MD [facility] Pulmonary and Critical Care Sleep medicine

## 2018-08-31 ENCOUNTER — Ambulatory Visit (INDEPENDENT_AMBULATORY_CARE_PROVIDER_SITE_OTHER): Payer: Medicare Other

## 2018-08-31 DIAGNOSIS — I5023 Acute on chronic systolic (congestive) heart failure: Secondary | ICD-10-CM | POA: Diagnosis not present

## 2018-08-31 DIAGNOSIS — J441 Chronic obstructive pulmonary disease with (acute) exacerbation: Secondary | ICD-10-CM | POA: Diagnosis not present

## 2018-08-31 DIAGNOSIS — E119 Type 2 diabetes mellitus without complications: Secondary | ICD-10-CM | POA: Diagnosis not present

## 2018-08-31 DIAGNOSIS — I110 Hypertensive heart disease with heart failure: Secondary | ICD-10-CM | POA: Diagnosis not present

## 2018-08-31 DIAGNOSIS — I4891 Unspecified atrial fibrillation: Secondary | ICD-10-CM | POA: Diagnosis not present

## 2018-08-31 DIAGNOSIS — E669 Obesity, unspecified: Secondary | ICD-10-CM | POA: Diagnosis not present

## 2018-08-31 DIAGNOSIS — Z6836 Body mass index (BMI) 36.0-36.9, adult: Secondary | ICD-10-CM | POA: Diagnosis not present

## 2018-08-31 NOTE — Progress Notes (Signed)
Pt INR 2.5 as per adam pt daughter advised continue coumadin 5 mg po daily and recheck in one week

## 2018-09-01 ENCOUNTER — Ambulatory Visit: Payer: Medicare Other

## 2018-09-02 ENCOUNTER — Other Ambulatory Visit: Payer: Self-pay | Admitting: Pharmacist

## 2018-09-02 DIAGNOSIS — Z6836 Body mass index (BMI) 36.0-36.9, adult: Secondary | ICD-10-CM | POA: Diagnosis not present

## 2018-09-02 DIAGNOSIS — E669 Obesity, unspecified: Secondary | ICD-10-CM | POA: Diagnosis not present

## 2018-09-02 DIAGNOSIS — J441 Chronic obstructive pulmonary disease with (acute) exacerbation: Secondary | ICD-10-CM | POA: Diagnosis not present

## 2018-09-02 DIAGNOSIS — I5023 Acute on chronic systolic (congestive) heart failure: Secondary | ICD-10-CM | POA: Diagnosis not present

## 2018-09-02 DIAGNOSIS — I110 Hypertensive heart disease with heart failure: Secondary | ICD-10-CM | POA: Diagnosis not present

## 2018-09-02 DIAGNOSIS — E119 Type 2 diabetes mellitus without complications: Secondary | ICD-10-CM | POA: Diagnosis not present

## 2018-09-02 NOTE — Patient Outreach (Signed)
[facility]) Care Management  [place]   09/02/2018  TREVOR DUTY [birth year] [id number]  Reason for referral: Medication Assistance with insulin  Referral source: [place]  Successful outreach to patient to confirm she is willing and able to participate in medication assistance programs.  Explained to patient that [employer] only offers Humalog Kwikpen 75/25.  Patient does not want to switch from a vial to a pen at this time.  Patient verbalizes understanding that [place] will not be applying for patient assistance as there is no NPH/short vial available for patient assistance (only pens).    Patient states she is no longer on inhalers, however latest pulmonology note from Dr. Humphrey Rolls states that patient is to continue on Spiriva.  Successful call to Dr. Laurelyn Sickle office to request refill for Spiriva.  RN to verify with MD.  Requested refill to be called in to [facility] ([place] [place]).  Patient instructed to call if inhaler not affordable.  Medication review already completed on 08/23/2018.  INR continues to be at goal.   Plan: I will close this case.  I'm happy to assist in the future as needed.   Regina Eck, PharmD, [place]  [phone]

## 2018-09-06 ENCOUNTER — Ambulatory Visit (INDEPENDENT_AMBULATORY_CARE_PROVIDER_SITE_OTHER): Payer: Medicare Other

## 2018-09-06 DIAGNOSIS — Z950 Presence of cardiac pacemaker: Secondary | ICD-10-CM | POA: Diagnosis not present

## 2018-09-06 DIAGNOSIS — Z794 Long term (current) use of insulin: Secondary | ICD-10-CM | POA: Diagnosis not present

## 2018-09-06 DIAGNOSIS — Z7901 Long term (current) use of anticoagulants: Secondary | ICD-10-CM | POA: Diagnosis not present

## 2018-09-06 DIAGNOSIS — Z952 Presence of prosthetic heart valve: Secondary | ICD-10-CM | POA: Diagnosis not present

## 2018-09-06 DIAGNOSIS — E119 Type 2 diabetes mellitus without complications: Secondary | ICD-10-CM | POA: Diagnosis not present

## 2018-09-06 DIAGNOSIS — I5023 Acute on chronic systolic (congestive) heart failure: Secondary | ICD-10-CM | POA: Diagnosis not present

## 2018-09-06 DIAGNOSIS — J441 Chronic obstructive pulmonary disease with (acute) exacerbation: Secondary | ICD-10-CM | POA: Diagnosis not present

## 2018-09-06 DIAGNOSIS — Z9981 Dependence on supplemental oxygen: Secondary | ICD-10-CM | POA: Diagnosis not present

## 2018-09-06 DIAGNOSIS — I4891 Unspecified atrial fibrillation: Secondary | ICD-10-CM

## 2018-09-06 DIAGNOSIS — Z6836 Body mass index (BMI) 36.0-36.9, adult: Secondary | ICD-10-CM | POA: Diagnosis not present

## 2018-09-06 DIAGNOSIS — E669 Obesity, unspecified: Secondary | ICD-10-CM | POA: Diagnosis not present

## 2018-09-06 DIAGNOSIS — I110 Hypertensive heart disease with heart failure: Secondary | ICD-10-CM | POA: Diagnosis not present

## 2018-09-06 LAB — POCT INR: INR: 2.3 (ref 2.0–3.0)

## 2018-09-07 ENCOUNTER — Encounter: Payer: Self-pay | Admitting: Nurse Practitioner

## 2018-09-07 ENCOUNTER — Encounter: Payer: Self-pay | Admitting: Adult Health

## 2018-09-07 ENCOUNTER — Ambulatory Visit: Payer: Self-pay | Admitting: Pharmacist

## 2018-09-07 DIAGNOSIS — Z6836 Body mass index (BMI) 36.0-36.9, adult: Secondary | ICD-10-CM | POA: Diagnosis not present

## 2018-09-07 DIAGNOSIS — I5023 Acute on chronic systolic (congestive) heart failure: Secondary | ICD-10-CM | POA: Diagnosis not present

## 2018-09-07 DIAGNOSIS — E119 Type 2 diabetes mellitus without complications: Secondary | ICD-10-CM | POA: Diagnosis not present

## 2018-09-07 DIAGNOSIS — I110 Hypertensive heart disease with heart failure: Secondary | ICD-10-CM | POA: Diagnosis not present

## 2018-09-07 DIAGNOSIS — E669 Obesity, unspecified: Secondary | ICD-10-CM | POA: Diagnosis not present

## 2018-09-07 DIAGNOSIS — J441 Chronic obstructive pulmonary disease with (acute) exacerbation: Secondary | ICD-10-CM | POA: Diagnosis not present

## 2018-09-07 LAB — PROTIME-INR
INR: 1.9 — AB (ref 0.9–1.1)
INR: 2.3 — AB (ref 0.9–1.1)
INR: 2.5 — AB (ref 0.9–1.1)
INR: 2.5 — AB (ref 0.9–1.1)
INR: 2.5 — AB (ref 0.9–1.1)

## 2018-09-09 DIAGNOSIS — E119 Type 2 diabetes mellitus without complications: Secondary | ICD-10-CM | POA: Diagnosis not present

## 2018-09-09 DIAGNOSIS — I10 Essential (primary) hypertension: Secondary | ICD-10-CM | POA: Diagnosis not present

## 2018-09-09 DIAGNOSIS — I350 Nonrheumatic aortic (valve) stenosis: Secondary | ICD-10-CM | POA: Diagnosis not present

## 2018-09-09 DIAGNOSIS — E782 Mixed hyperlipidemia: Secondary | ICD-10-CM | POA: Diagnosis not present

## 2018-09-09 DIAGNOSIS — Z6836 Body mass index (BMI) 36.0-36.9, adult: Secondary | ICD-10-CM | POA: Diagnosis not present

## 2018-09-09 DIAGNOSIS — J441 Chronic obstructive pulmonary disease with (acute) exacerbation: Secondary | ICD-10-CM | POA: Diagnosis not present

## 2018-09-09 DIAGNOSIS — I5023 Acute on chronic systolic (congestive) heart failure: Secondary | ICD-10-CM | POA: Diagnosis not present

## 2018-09-09 DIAGNOSIS — I442 Atrioventricular block, complete: Secondary | ICD-10-CM | POA: Insufficient documentation

## 2018-09-09 DIAGNOSIS — E669 Obesity, unspecified: Secondary | ICD-10-CM | POA: Diagnosis not present

## 2018-09-09 DIAGNOSIS — I5031 Acute diastolic (congestive) heart failure: Secondary | ICD-10-CM | POA: Diagnosis not present

## 2018-09-09 DIAGNOSIS — I110 Hypertensive heart disease with heart failure: Secondary | ICD-10-CM | POA: Diagnosis not present

## 2018-09-13 ENCOUNTER — Ambulatory Visit (INDEPENDENT_AMBULATORY_CARE_PROVIDER_SITE_OTHER): Payer: Medicare Other

## 2018-09-13 DIAGNOSIS — I4821 Permanent atrial fibrillation: Secondary | ICD-10-CM | POA: Diagnosis not present

## 2018-09-13 DIAGNOSIS — Z7901 Long term (current) use of anticoagulants: Secondary | ICD-10-CM | POA: Diagnosis not present

## 2018-09-13 DIAGNOSIS — I4891 Unspecified atrial fibrillation: Secondary | ICD-10-CM | POA: Diagnosis not present

## 2018-09-13 NOTE — Progress Notes (Signed)
Pt INR 2.8  Continue same med

## 2018-09-14 DIAGNOSIS — J441 Chronic obstructive pulmonary disease with (acute) exacerbation: Secondary | ICD-10-CM | POA: Diagnosis not present

## 2018-09-14 DIAGNOSIS — Z6836 Body mass index (BMI) 36.0-36.9, adult: Secondary | ICD-10-CM | POA: Diagnosis not present

## 2018-09-14 DIAGNOSIS — E119 Type 2 diabetes mellitus without complications: Secondary | ICD-10-CM | POA: Diagnosis not present

## 2018-09-14 DIAGNOSIS — I5023 Acute on chronic systolic (congestive) heart failure: Secondary | ICD-10-CM | POA: Diagnosis not present

## 2018-09-14 DIAGNOSIS — E669 Obesity, unspecified: Secondary | ICD-10-CM | POA: Diagnosis not present

## 2018-09-14 DIAGNOSIS — I110 Hypertensive heart disease with heart failure: Secondary | ICD-10-CM | POA: Diagnosis not present

## 2018-09-15 ENCOUNTER — Other Ambulatory Visit: Payer: Self-pay | Admitting: Internal Medicine

## 2018-09-15 ENCOUNTER — Other Ambulatory Visit: Payer: Self-pay | Admitting: Adult Health

## 2018-09-16 DIAGNOSIS — I110 Hypertensive heart disease with heart failure: Secondary | ICD-10-CM | POA: Diagnosis not present

## 2018-09-16 DIAGNOSIS — I5023 Acute on chronic systolic (congestive) heart failure: Secondary | ICD-10-CM | POA: Diagnosis not present

## 2018-09-16 DIAGNOSIS — J441 Chronic obstructive pulmonary disease with (acute) exacerbation: Secondary | ICD-10-CM | POA: Diagnosis not present

## 2018-09-16 DIAGNOSIS — E119 Type 2 diabetes mellitus without complications: Secondary | ICD-10-CM | POA: Diagnosis not present

## 2018-09-16 DIAGNOSIS — Z6836 Body mass index (BMI) 36.0-36.9, adult: Secondary | ICD-10-CM | POA: Diagnosis not present

## 2018-09-16 DIAGNOSIS — E669 Obesity, unspecified: Secondary | ICD-10-CM | POA: Diagnosis not present

## 2018-09-20 ENCOUNTER — Ambulatory Visit: Payer: Medicare Other | Admitting: Family

## 2018-09-21 ENCOUNTER — Ambulatory Visit: Payer: Self-pay | Admitting: Nurse Practitioner

## 2018-09-23 ENCOUNTER — Other Ambulatory Visit: Payer: Self-pay | Admitting: *Deleted

## 2018-09-23 NOTE — Patient Outreach (Signed)
[facility]) Care Management  09/23/2018  Kimberly Peterson [birth year] [id number]   Call placed to member to follow up on CHF management and overall health condition.  State she is on a long distance call and unable to talk at this time.  Requested member to call this care manager back at a more convenient time.  If no call back, will follow up within the next week.  Valente David, [place], MSN [place] [phone]

## 2018-09-24 ENCOUNTER — Ambulatory Visit (INDEPENDENT_AMBULATORY_CARE_PROVIDER_SITE_OTHER): Payer: Medicare Other

## 2018-09-24 DIAGNOSIS — I4891 Unspecified atrial fibrillation: Secondary | ICD-10-CM

## 2018-09-24 NOTE — Progress Notes (Signed)
Pt inr 2.0 continue same med

## 2018-09-27 ENCOUNTER — Ambulatory Visit (INDEPENDENT_AMBULATORY_CARE_PROVIDER_SITE_OTHER): Payer: Medicare Other

## 2018-09-27 DIAGNOSIS — I4891 Unspecified atrial fibrillation: Secondary | ICD-10-CM

## 2018-09-27 LAB — POCT INR: INR: 2.1 (ref 2.0–3.0)

## 2018-09-27 NOTE — Progress Notes (Signed)
Per adam continue with current medication

## 2018-09-29 ENCOUNTER — Other Ambulatory Visit: Payer: Self-pay | Admitting: *Deleted

## 2018-09-29 ENCOUNTER — Other Ambulatory Visit: Payer: Self-pay

## 2018-09-29 NOTE — Patient Outreach (Signed)
[facility]) Care Management  09/29/2018  Kimberly Peterson 1929-08-02 [id number]   Call placed to member to follow up on heart failure management.  She report doing well, denies any shortness of breath or extremity swelling.  Report she has been checking her weights daily, denies any weight gain.  Today's weight was 199 pounds.  Also report blood sugars are stable, today's reading 96.  Has appointment with cardiologist on 4/23.  This care manager inquired about potential barriers during Covid-19 pandemic.  State she is still living with her daughter, denies any barriers.  Report they have enough food, medications, and supplies to last several weeks.  Denies any urgent concerns at this time, will follow up within the net month.  Advised to contact with questions.  [facility] CM Care Plan Problem One     Most Recent Value  Care Plan Problem One  Risk for hospitalization related to heart failure complications as evidenced by recent hospital admission  Role Documenting the Problem One  Care Management [place] for Problem One  Active  [facility] Long Term Goal   Member will not be readmitted to hospital within the next 45 days.  [facility] Long Term Goal Start Date  08/06/18  [facility] Long Term Goal Met Date  09/29/18  [facility] CM Short Term Goal #1   Member will take medications as prescribed over the next 4 weeks  TH[facility] CM Short Term Goal #1 Start Date  08/06/18  [facility] CM Short Term Goal #1 Met Date  09/29/18  TH[facility] CM Short Term Goal #2   Member will weigh self daily over the next 4 weeks  [facility] CM Short Term Goal #2 Start Date  08/06/18  [facility] CM Short Term Goal #2 Met Date  09/29/18     Valente David, RN, MSN [place] Manager [phone]

## 2018-10-04 ENCOUNTER — Other Ambulatory Visit: Payer: Self-pay

## 2018-10-04 ENCOUNTER — Ambulatory Visit (INDEPENDENT_AMBULATORY_CARE_PROVIDER_SITE_OTHER): Payer: Medicare Other

## 2018-10-04 DIAGNOSIS — I4891 Unspecified atrial fibrillation: Secondary | ICD-10-CM

## 2018-10-04 NOTE — Progress Notes (Signed)
Pt inr 1.8 continue same for now recheck in one week as per adam

## 2018-10-06 ENCOUNTER — Encounter: Payer: Self-pay | Admitting: Nurse Practitioner

## 2018-10-06 LAB — PROTIME-INR
INR: 1.8 — AB (ref 0.9–1.1)
INR: 2 — AB (ref 0.9–1.1)
INR: 2.1 — AB (ref 0.9–1.1)
INR: 2.8 — AB (ref 0.9–1.1)

## 2018-10-11 ENCOUNTER — Other Ambulatory Visit: Payer: Self-pay

## 2018-10-11 ENCOUNTER — Ambulatory Visit (INDEPENDENT_AMBULATORY_CARE_PROVIDER_SITE_OTHER): Payer: Medicare Other

## 2018-10-11 DIAGNOSIS — I4891 Unspecified atrial fibrillation: Secondary | ICD-10-CM | POA: Diagnosis not present

## 2018-10-11 DIAGNOSIS — Z7901 Long term (current) use of anticoagulants: Secondary | ICD-10-CM | POA: Diagnosis not present

## 2018-10-11 DIAGNOSIS — I4821 Permanent atrial fibrillation: Secondary | ICD-10-CM | POA: Diagnosis not present

## 2018-10-11 LAB — POCT INR: INR: 2.3 (ref 2.0–3.0)

## 2018-10-11 NOTE — Progress Notes (Signed)
Home inr 2.3 per adam continue with medication

## 2018-10-14 ENCOUNTER — Ambulatory Visit (INDEPENDENT_AMBULATORY_CARE_PROVIDER_SITE_OTHER): Payer: Medicare Other | Admitting: Nurse Practitioner

## 2018-10-14 ENCOUNTER — Encounter: Payer: Self-pay | Admitting: Nurse Practitioner

## 2018-10-14 ENCOUNTER — Other Ambulatory Visit: Payer: Self-pay

## 2018-10-14 VITALS — BP 180/72 | HR 89 | Ht 62.0 in | Wt 200.2 lb

## 2018-10-14 DIAGNOSIS — I10 Essential (primary) hypertension: Secondary | ICD-10-CM | POA: Diagnosis not present

## 2018-10-14 DIAGNOSIS — I4891 Unspecified atrial fibrillation: Secondary | ICD-10-CM | POA: Diagnosis not present

## 2018-10-14 DIAGNOSIS — Z9981 Dependence on supplemental oxygen: Secondary | ICD-10-CM | POA: Diagnosis not present

## 2018-10-14 DIAGNOSIS — E1151 Type 2 diabetes mellitus with diabetic peripheral angiopathy without gangrene: Secondary | ICD-10-CM | POA: Diagnosis not present

## 2018-10-14 NOTE — Progress Notes (Signed)
[facility] Peterson, Kimberly [address]  Internal MEDICINE  Telephone Visit  Patient Name: Kimberly Peterson  [id number]  [id number]  Date of Service: 10/17/2018  I connected with the patient at 11:15am by webcam and verified the patients identity using two identifiers.   I discussed the limitations, risks, security and privacy concerns of performing an evaluation and management service by webcam and the availability of in person appointments. I also discussed with the patient that there may be a patient responsible charge related to the service.  The patient expressed understanding and agrees to proceed.    Chief Complaint  Patient presents with  . Medical Management of Chronic Issues    Diabetic foot care, paper work, pt needs diabetic shoes and exam  . Diabetes    pt blood sugar this morning was 196, INR monday was 2.8    The patient has been contacted via webcam for follow up visit due to concerns for spread of novel coronavirus. The patient is in need a pair of diabetic shoes. She does have nonpitting edema in both feet and dry skin, bilaterally. The skin is otherwise intact and she does have intact sensation in both feet. She states that her blood sugars are doing well. On 10/11/2018 blod sugar was 123 in am and 168 in the evening. On 10/12/2018, blood usgar was 98 in the morning and 93 in the evening. On 10/13/2018 her blood sugar was 135 in the morning and 110 in the evening. 10/14/2018, her blood sugar was 196 in the morning. The patient states that she had birthday cake last night, as it was her birthday, and this is likely why her blood sugar is elevated. The patient checked her INR this morning and is was 2.8. her blood pressure is elevated this morning and was taken on her home blood pressure monitor.       Current Medication: Outpatient Encounter Medications as of 10/14/2018  Medication Sig  . aspirin 81 MG tablet Take 81 mg by mouth daily.  Marland Kitchen atorvastatin  (LIPITOR) 10 MG tablet TAKE 1 TABLET BY MOUTH ONCE DAILY FOR CHOLESTEROL  . Blood Glucose Monitoring Suppl (PRODIGY AUTOCODE BLOOD GLUCOSE) DEVI Use as directed  . CARTIA XT 240 MG 24 hr capsule TAKE 1 CAPSULE BY MOUTH ONCE DAILY  . carvedilol (COREG) 25 MG tablet TAKE 1 TABLET BY MOUTH TWICE DAILY  . furosemide (LASIX) 40 MG tablet TAKE 1 TABLET BY MOUTH ONCE DAILY FOR  FLUID.  MAY  TAKE  HALF  EXTRA  IF  NEEDED  . glucose blood (PRODIGY NO CODING BLOOD GLUC) test strip 1 each by Other route 3 (three) times daily. Use as instructed  . hydrALAZINE (APRESOLINE) 25 MG tablet TAKE 1 & 1/2 (ONE & ONE-HALF) TABLETS BY MOUTH THREE TIMES DAILY FOR BLOOD PRESSURE  . insulin aspart protamine- aspart (NOVOLOG MIX 70/30) (70-30) 100 UNIT/ML injection Inject 26-34 Units into the skin 2 (two) times daily with a meal. 34 units in morning and 26 units in the evening  . OXYGEN Inhale into the lungs continuous.  . pantoprazole (PROTONIX) 40 MG tablet Take 1 tablet (40 mg total) by mouth daily.  Marland Kitchen tiotropium (SPIRIVA) 18 MCG inhalation capsule Place 1 capsule (18 mcg total) into inhaler and inhale daily.  Marland Kitchen warfarin (COUMADIN) 5 MG tablet TAKE ONE TABLET BY MOUTH ONCE DAILY AT 5-6PM AND TAKE ONE AND ONE-HALF TABLET BY MOUTH ON THURSDAY (Patient taking differently: Take half tab every night at 6  pm.)  . lisinopril (PRINIVIL,ZESTRIL) 20 MG tablet Take 1 tablet (20 mg total) by mouth daily for 30 days.   No facility-administered encounter medications on file as of 10/14/2018.     Surgical History: Past Surgical History:  Procedure Laterality Date  . aoric valve replacemet      st Jude  . CATARACT EXTRACTION, BILATERAL    . PACEMAKER PLACEMENT    . VALVE REPLACEMENT      Medical History: Past Medical History:  Diagnosis Date  . CHF (congestive heart failure) ([place])   . Chronic kidney disease   . COPD (chronic obstructive pulmonary disease) ([place])   . Diabetes mellitus without complication ([place])   .  Hypertension     Family History: History reviewed. No pertinent family history.  Social History   Socioeconomic History  . Marital status: Married    Spouse name: Not on file  . Number of children: Not on file  . Years of education: Not on file  . Highest education level: Not on file  Occupational History  . Not on file  Social Needs  . Financial resource strain: Not on file  . Food insecurity:    Worry: Not on file    Inability: Not on file  . Transportation needs:    Medical: Not on file    Non-medical: Not on file  Tobacco Use  . Smoking status: Never Smoker  . Smokeless tobacco: Never Used  Substance and Sexual Activity  . Alcohol use: No  . Drug use: No  . Sexual activity: Never  Lifestyle  . Physical activity:    Days per week: Not on file    Minutes per session: Not on file  . Stress: Not on file  Relationships  . Social connections:    Talks on phone: Not on file    Gets together: Not on file    Attends religious service: Not on file    Active member of club or organization: Not on file    Attends meetings of clubs or organizations: Not on file    Relationship status: Not on file  . Intimate partner violence:    Fear of current or ex partner: Not on file    Emotionally abused: Not on file    Physically abused: Not on file    Forced sexual activity: Not on file  Other Topics Concern  . Not on file  Social History Narrative  . Not on file      Review of Systems  Constitutional: Negative for chills, fatigue and unexpected weight change.  HENT: Negative for congestion, rhinorrhea, sneezing and sore throat.   Eyes: Negative for photophobia, pain and redness.  Respiratory: Negative for cough, chest tightness, shortness of breath and wheezing.        Chronic oxygen use.  Cardiovascular: Positive for leg swelling. Negative for chest pain and palpitations.  Gastrointestinal: Negative for abdominal pain, constipation, diarrhea, nausea and vomiting.   Endocrine: Negative for cold intolerance, heat intolerance, polydipsia and polyuria.       Blood sugars doing well   Musculoskeletal: Negative for arthralgias, back pain, joint swelling and neck pain.  Skin: Negative for rash.  Allergic/Immunologic: Negative.   Neurological: Negative for tremors and numbness.  Hematological: Negative for adenopathy. Does not bruise/bleed easily.  Psychiatric/Behavioral: Negative for behavioral problems and sleep disturbance. The patient is not nervous/anxious.     Today's Vitals   10/14/18 1123  BP: (!) 180/72  Pulse: 89  Weight: 200 lb 3.2 oz (  90.8 kg)  Height: 5\' 2"  (1.575 m)   Body mass index is 36.62 kg/m.  Observation/Objective:  The patient is awake, alert, and oriented. She is in no distress. She is on nasal cannula oxygen at all times.   A diabetic foot exam was performed today. She does have intact pedal pulses. There is mild swelling present in both feet with dry skin and flaking present. There is no evidence of lesions and skin breakdown present today. Sensation is intact in both feet.   Assessment/Plan: 1. Type 2 diabetes with peripheral circulatory disorder, controlled (HCC) Well managed type 2 diabetes. Blood sugars mostly in the low 100s. Diabetic foot exam perfromed today. Order for diabetic shoes to be completed and submitted to DME company.   2. Atrial fibrillation, unspecified type ([place]) Chronic warfarin use. INR 2.8 today. Continue warfarin use as prescribed without changes today.   3. Benign essential HTN Stable. Continue bp medication as prescribed   4. Dependence on supplemental oxygen Continue nasal cannula oxygen as prescribed   General Counseling: Sidney verbalizes understanding of the findings of today's phone visit and agrees with plan of treatment. I have discussed any further diagnostic evaluation that may be needed or ordered today. We also reviewed her medications today. she has been encouraged to call the  office with any questions or concerns that should arise related to todays visit.  Diabetes Counseling:  1. Addition of ACE inh/ ARB'S for nephroprotection. Microalbumin is updated  2. Diabetic foot care, prevention of complications. Podiatry consult 3. Exercise and lose weight.  4. Diabetic eye examination, Diabetic eye exam is updated  5. Monitor blood sugar closlely. nutrition counseling.  6. Sign and symptoms of hypoglycemia including shaking sweating,confusion and headaches.  This patient was seen by Leretha Pol FNP Collaboration with Dr [name] as a part of collaborative care agreement  Time spent: 25 Minutes    Dr [name] Internal medicine

## 2018-10-17 DIAGNOSIS — E1151 Type 2 diabetes mellitus with diabetic peripheral angiopathy without gangrene: Secondary | ICD-10-CM | POA: Insufficient documentation

## 2018-10-18 ENCOUNTER — Ambulatory Visit (INDEPENDENT_AMBULATORY_CARE_PROVIDER_SITE_OTHER): Payer: Medicare Other

## 2018-10-18 ENCOUNTER — Other Ambulatory Visit: Payer: Self-pay

## 2018-10-18 DIAGNOSIS — I4891 Unspecified atrial fibrillation: Secondary | ICD-10-CM | POA: Diagnosis not present

## 2018-10-18 NOTE — Progress Notes (Signed)
Pt inr 2.5 continue same med as per [employer]

## 2018-10-19 ENCOUNTER — Telehealth: Payer: Self-pay

## 2018-10-19 NOTE — Telephone Encounter (Signed)
Pt daughter called that pt in morning dropping her sugar down sweating and dizziness as per dr Humphrey Rolls advised pt daughter that decrease novolog at night to 22 units and called [place] back on morning let us how she is doing

## 2018-10-19 NOTE — Telephone Encounter (Signed)
ERROR

## 2018-10-25 ENCOUNTER — Ambulatory Visit (INDEPENDENT_AMBULATORY_CARE_PROVIDER_SITE_OTHER): Payer: Medicare Other

## 2018-10-25 ENCOUNTER — Other Ambulatory Visit: Payer: Self-pay

## 2018-10-25 DIAGNOSIS — I4891 Unspecified atrial fibrillation: Secondary | ICD-10-CM | POA: Diagnosis not present

## 2018-10-25 NOTE — Progress Notes (Signed)
Pt INR 2.5 continue same med

## 2018-10-27 ENCOUNTER — Other Ambulatory Visit: Payer: Self-pay | Admitting: *Deleted

## 2018-10-27 ENCOUNTER — Encounter: Payer: Self-pay | Admitting: Nurse Practitioner

## 2018-10-27 LAB — PROTIME-INR
INR: 2.3 — AB (ref 0.9–1.1)
INR: 2.5 — AB (ref 0.9–1.1)
INR: 2.5 — AB (ref 0.9–1.1)

## 2018-10-27 NOTE — Patient Outreach (Signed)
[facility]) Care Management  10/27/2018  Kimberly Peterson 1929-09-08 [id number]   Call placed to member to follow up on management of chronic medical conditions.  Daughter present during call, both report member is doing well, denies shortness of breath or swelling in legs/feet.  Self monitoring readings for today: CBG - 90, weight - 200 pounds (no significant increase, down 12 pounds in the last 2 months), and blood pressure - 170/63 (before meds).  They both verbalize understanding of heart failure zones and when to contact MD, report [place] zone today.  Has follow up appointment virtually tomorrow with cardiology.  Denies any urgent concerns at this time, advised to contact this care manager with questions.  Will follow up within the next month, if remain stable will consider transition to health coach.  Valente David, [place], MSN [place] [phone]

## 2018-10-28 ENCOUNTER — Telehealth: Payer: Self-pay

## 2018-10-28 DIAGNOSIS — I10 Essential (primary) hypertension: Secondary | ICD-10-CM | POA: Diagnosis not present

## 2018-10-28 DIAGNOSIS — I350 Nonrheumatic aortic (valve) stenosis: Secondary | ICD-10-CM | POA: Diagnosis not present

## 2018-10-28 DIAGNOSIS — E782 Mixed hyperlipidemia: Secondary | ICD-10-CM | POA: Diagnosis not present

## 2018-10-28 DIAGNOSIS — I442 Atrioventricular block, complete: Secondary | ICD-10-CM | POA: Diagnosis not present

## 2018-10-28 NOTE — Telephone Encounter (Signed)
Faxed diabetic shoe paper to [employer]

## 2018-11-02 ENCOUNTER — Ambulatory Visit (INDEPENDENT_AMBULATORY_CARE_PROVIDER_SITE_OTHER): Payer: Medicare Other

## 2018-11-02 ENCOUNTER — Other Ambulatory Visit: Payer: Self-pay

## 2018-11-02 DIAGNOSIS — I4891 Unspecified atrial fibrillation: Secondary | ICD-10-CM

## 2018-11-02 NOTE — Progress Notes (Signed)
Home inr test 2.0 , Per Adam continue with current medication

## 2018-11-08 ENCOUNTER — Other Ambulatory Visit: Payer: Self-pay

## 2018-11-08 ENCOUNTER — Ambulatory Visit (INDEPENDENT_AMBULATORY_CARE_PROVIDER_SITE_OTHER): Payer: Medicare Other

## 2018-11-08 DIAGNOSIS — I4821 Permanent atrial fibrillation: Secondary | ICD-10-CM | POA: Diagnosis not present

## 2018-11-08 DIAGNOSIS — I4891 Unspecified atrial fibrillation: Secondary | ICD-10-CM | POA: Diagnosis not present

## 2018-11-08 DIAGNOSIS — Z7901 Long term (current) use of anticoagulants: Secondary | ICD-10-CM | POA: Diagnosis not present

## 2018-11-08 NOTE — Progress Notes (Signed)
Pt inr 1.9 continue same med

## 2018-11-15 ENCOUNTER — Other Ambulatory Visit: Payer: Self-pay

## 2018-11-15 ENCOUNTER — Ambulatory Visit (INDEPENDENT_AMBULATORY_CARE_PROVIDER_SITE_OTHER): Payer: Medicare Other

## 2018-11-15 DIAGNOSIS — I4891 Unspecified atrial fibrillation: Secondary | ICD-10-CM

## 2018-11-15 NOTE — Progress Notes (Signed)
Pt INr 2.4 continue same med

## 2018-11-16 ENCOUNTER — Encounter: Payer: Self-pay | Admitting: Nurse Practitioner

## 2018-11-16 LAB — PROTIME-INR
INR: 1.9 — AB (ref 0.9–1.1)
INR: 2 — AB (ref 0.9–1.1)
INR: 2.4 — AB (ref 0.9–1.1)

## 2018-11-22 ENCOUNTER — Other Ambulatory Visit: Payer: Self-pay

## 2018-11-22 ENCOUNTER — Ambulatory Visit (INDEPENDENT_AMBULATORY_CARE_PROVIDER_SITE_OTHER): Payer: Medicare Other

## 2018-11-22 DIAGNOSIS — Z7901 Long term (current) use of anticoagulants: Secondary | ICD-10-CM | POA: Diagnosis not present

## 2018-11-22 NOTE — Progress Notes (Signed)
Pt INR 2.2 continue same med

## 2018-11-26 ENCOUNTER — Other Ambulatory Visit: Payer: Self-pay | Admitting: *Deleted

## 2018-11-26 NOTE — Patient Outreach (Signed)
[facility]) Care Management  11/26/2018  Kimberly Peterson 10/01/1929 [id number]   Call placed to member to follow up on heart failure management.  Both she and daughter, Kimberly Peterson, state member has been doing well.  Member denies any shortness of breath or chest discomfort.  Denies any new extremity swelling or weight gain.  State she has still been monitoring daily weights, blood pressure and blood sugars and they all have been "stable."  Assessed the need for further complex services, none identified.  Agrees to ongoing follow up for disease management.  Will place referral to health coach and notify primary MD of transition.  Valente David, [place], MSN [place] [phone]

## 2018-11-30 ENCOUNTER — Ambulatory Visit: Payer: Self-pay | Admitting: Internal Medicine

## 2018-11-30 ENCOUNTER — Other Ambulatory Visit: Payer: Self-pay

## 2018-11-30 ENCOUNTER — Ambulatory Visit (INDEPENDENT_AMBULATORY_CARE_PROVIDER_SITE_OTHER): Payer: Medicare Other

## 2018-11-30 DIAGNOSIS — I4891 Unspecified atrial fibrillation: Secondary | ICD-10-CM

## 2018-11-30 NOTE — Progress Notes (Signed)
Pt inr 2.5 continue same med

## 2018-12-01 ENCOUNTER — Encounter: Payer: Self-pay | Admitting: Adult Health

## 2018-12-01 ENCOUNTER — Telehealth: Payer: Self-pay

## 2018-12-01 LAB — PROTIME-INR
INR: 2.2 — AB (ref 0.9–1.1)
INR: 2.5 — AB (ref 0.9–1.1)

## 2018-12-01 NOTE — Telephone Encounter (Signed)
Gave [facility] orders for new cpap and put copy in scan. Per patient daughter she is not willing to have another sleep study and per her insurance she is due for new cpap with an order only. Beth

## 2018-12-06 ENCOUNTER — Ambulatory Visit (INDEPENDENT_AMBULATORY_CARE_PROVIDER_SITE_OTHER): Payer: Medicare Other

## 2018-12-06 ENCOUNTER — Other Ambulatory Visit: Payer: Self-pay | Admitting: Internal Medicine

## 2018-12-06 ENCOUNTER — Other Ambulatory Visit: Payer: Self-pay

## 2018-12-06 ENCOUNTER — Other Ambulatory Visit: Payer: Self-pay | Admitting: Adult Health

## 2018-12-06 DIAGNOSIS — Z7901 Long term (current) use of anticoagulants: Secondary | ICD-10-CM | POA: Diagnosis not present

## 2018-12-06 DIAGNOSIS — I4891 Unspecified atrial fibrillation: Secondary | ICD-10-CM | POA: Diagnosis not present

## 2018-12-06 DIAGNOSIS — I4821 Permanent atrial fibrillation: Secondary | ICD-10-CM | POA: Diagnosis not present

## 2018-12-06 MED ORDER — ATORVASTATIN CALCIUM 10 MG PO TABS: ORAL_TABLET | ORAL | 3 refills | Status: DC

## 2018-12-06 NOTE — Progress Notes (Signed)
Pt inr 2.1 continue same med

## 2018-12-13 ENCOUNTER — Ambulatory Visit (INDEPENDENT_AMBULATORY_CARE_PROVIDER_SITE_OTHER): Payer: Medicare Other

## 2018-12-13 ENCOUNTER — Other Ambulatory Visit: Payer: Self-pay

## 2018-12-13 DIAGNOSIS — I4891 Unspecified atrial fibrillation: Secondary | ICD-10-CM

## 2018-12-13 NOTE — Progress Notes (Signed)
Home inr is  2.3 continue with mediation

## 2018-12-14 DIAGNOSIS — I442 Atrioventricular block, complete: Secondary | ICD-10-CM | POA: Diagnosis not present

## 2018-12-15 ENCOUNTER — Encounter: Payer: Self-pay | Admitting: Adult Health

## 2018-12-15 LAB — PROTIME-INR
INR: 2.1 — AB (ref 0.9–1.1)
INR: 2.3 — AB (ref 0.9–1.1)

## 2018-12-28 ENCOUNTER — Encounter: Payer: Self-pay | Admitting: Internal Medicine

## 2018-12-28 ENCOUNTER — Ambulatory Visit: Payer: Medicare Other | Admitting: Internal Medicine

## 2018-12-28 ENCOUNTER — Other Ambulatory Visit: Payer: Self-pay

## 2018-12-28 VITALS — BP 189/76 | HR 81 | Ht 62.0 in | Wt 199.2 lb

## 2018-12-28 DIAGNOSIS — I10 Essential (primary) hypertension: Secondary | ICD-10-CM

## 2018-12-28 DIAGNOSIS — Z9981 Dependence on supplemental oxygen: Secondary | ICD-10-CM | POA: Diagnosis not present

## 2018-12-28 DIAGNOSIS — G4733 Obstructive sleep apnea (adult) (pediatric): Secondary | ICD-10-CM

## 2018-12-28 DIAGNOSIS — Z9989 Dependence on other enabling machines and devices: Secondary | ICD-10-CM

## 2018-12-28 NOTE — Progress Notes (Signed)
[facility] [place], [place] [address]  Internal MEDICINE  Telephone Visit  Patient Name: Kimberly Peterson  [id number]  [id number]  Date of Service: 12/28/2018  I connected with the patient at 450 by telephone and verified the patients identity using two identifiers.   I discussed the limitations, risks, security and privacy concerns of performing an evaluation and management service by telephone and the availability of in person appointments. I also discussed with the patient that there may be a patient responsible charge related to the service.  The patient expressed understanding and agrees to proceed.    Chief Complaint  Patient presents with  . Sleep Apnea    cpap follow up need new cpap machine   . COPD    oygen using 24 hrs   . Hypertension    pt Bp was high     HPI PT seen via telephone visit for follow up on osa.  She reports her current cpap is over 14 years old and has multiple broken parts.  She is in need of an order for a new cpap.  She has apparently already discussed with her insurance company that she refuses to do a new sleep study due to her age, and covid.  An order will be placed at todays visit.  She also has consistently elevated bp.  Dr. Clayborn Bigness wrote for her to begin hydralazine 25mg  tablets.  In her instructions she wrote for patient to take 1.5 tablets 3 times a day. She has only been taking 1 tablet 3 times a day.      Current Medication: Outpatient Encounter Medications as of 12/28/2018  Medication Sig  . aspirin 81 MG tablet Take 81 mg by mouth daily.  Marland Kitchen atorvastatin (LIPITOR) 10 MG tablet TAKE 1 TABLET BY MOUTH ONCE DAILY FOR CHOLESTEROL  . Blood Glucose Monitoring Suppl (PRODIGY AUTOCODE BLOOD GLUCOSE) DEVI Use as directed  . CARTIA XT 240 MG 24 hr capsule Take 1 capsule by mouth once daily  . carvedilol (COREG) 25 MG tablet Take 1 tablet by mouth twice daily  . furosemide (LASIX) 40 MG tablet TAKE 1 TABLET BY MOUTH ONCE  DAILY FOR FLUID. MAY TAKE ONE HALF TABLET EXTRA IF NEEDED  . glucose blood (PRODIGY NO CODING BLOOD GLUC) test strip 1 each by Other route 3 (three) times daily. Use as instructed  . hydrALAZINE (APRESOLINE) 25 MG tablet TAKE 1 & 1/2 (ONE & ONE-HALF) TABLETS BY MOUTH THREE TIMES DAILY FOR BLOOD PRESSURE  . insulin aspart protamine- aspart (NOVOLOG MIX 70/30) (70-30) 100 UNIT/ML injection Inject 26-34 Units into the skin 2 (two) times daily with a meal. 34 units in morning and 26 units in the evening  . OXYGEN Inhale into the lungs continuous.  Marland Kitchen tiotropium (SPIRIVA) 18 MCG inhalation capsule Place 1 capsule (18 mcg total) into inhaler and inhale daily.  Marland Kitchen warfarin (COUMADIN) 5 MG tablet TAKE ONE TABLET BY MOUTH ONCE DAILY AT 5-6PM AND TAKE ONE AND ONE-HALF TABLET BY MOUTH ON THURSDAY (Patient taking differently: Take half tab every night at 6 pm.)  . lisinopril (PRINIVIL,ZESTRIL) 20 MG tablet Take 1 tablet (20 mg total) by mouth daily for 30 days.   No facility-administered encounter medications on file as of 12/28/2018.     Surgical History: Past Surgical History:  Procedure Laterality Date  . aoric valve replacemet      st Jude  . CATARACT EXTRACTION, BILATERAL    . PACEMAKER PLACEMENT    .  VALVE REPLACEMENT      Medical History: Past Medical History:  Diagnosis Date  . CHF (congestive heart failure) ([place])   . Chronic kidney disease   . COPD (chronic obstructive pulmonary disease) ([place])   . Diabetes mellitus without complication ([place])   . Hypertension     Family History: History reviewed. No pertinent family history.  Social History   Socioeconomic History  . Marital status: Married    Spouse name: Not on file  . Number of children: Not on file  . Years of education: Not on file  . Highest education level: Not on file  Occupational History  . Not on file  Social Needs  . Financial resource strain: Not on file  . Food insecurity    Worry: Not on file    Inability: Not  on file  . Transportation needs    Medical: Not on file    Non-medical: Not on file  Tobacco Use  . Smoking status: Never Smoker  . Smokeless tobacco: Never Used  Substance and Sexual Activity  . Alcohol use: No  . Drug use: No  . Sexual activity: Never  Lifestyle  . Physical activity    Days per week: Not on file    Minutes per session: Not on file  . Stress: Not on file  Relationships  . Social Herbalist on phone: Not on file    Gets together: Not on file    Attends religious service: Not on file    Active member of club or organization: Not on file    Attends meetings of clubs or organizations: Not on file    Relationship status: Not on file  . Intimate partner violence    Fear of current or ex partner: Not on file    Emotionally abused: Not on file    Physically abused: Not on file    Forced sexual activity: Not on file  Other Topics Concern  . Not on file  Social History Narrative  . Not on file      Review of Systems  Constitutional: Negative for chills, fatigue and unexpected weight change.  HENT: Negative for congestion, rhinorrhea, sneezing and sore throat.   Eyes: Negative for photophobia, pain and redness.  Respiratory: Negative for cough, chest tightness and shortness of breath.   Cardiovascular: Negative for chest pain and palpitations.  Gastrointestinal: Negative for abdominal pain, constipation, diarrhea, nausea and vomiting.  Endocrine: Negative.   Genitourinary: Negative for dysuria and frequency.  Musculoskeletal: Negative for arthralgias, back pain, joint swelling and neck pain.  Skin: Negative for rash.  Allergic/Immunologic: Negative.   Neurological: Negative for tremors and numbness.  Hematological: Negative for adenopathy. Does not bruise/bleed easily.  Psychiatric/Behavioral: Negative for behavioral problems and sleep disturbance. The patient is not nervous/anxious.     Vital Signs: BP (!) 189/76   Pulse 81   Ht 5\' 2"  (1.575  m)   Wt 199 lb 3.2 oz (90.4 kg)   SpO2 93% Comment: 2 litre  BMI 36.43 kg/m    Observation/Objective:  NAD at this time.     Assessment/Plan:  1. OSA on CPAP New cpap ordered for patient. WE discussed that she may be required to do another sleep study, but she is refusing that at this time.  - For home use only DME continuous positive airway pressure (CPAP)  2. Benign essential HTN Slightly elevated today.  Encouraged patient to remain compliant with her medications.   3. Dependence on supplemental  oxygen Continue to use oxygen 2-3 LPM via [place] as ordered.   General Counseling: Krystyna verbalizes understanding of the findings of today's phone visit and agrees with plan of treatment. I have discussed any further diagnostic evaluation that may be needed or ordered today. We also reviewed her medications today. she has been encouraged to call the office with any questions or concerns that should arise related to todays visit.    No orders of the defined types were placed in this encounter.   No orders of the defined types were placed in this encounter.   Time spent: [facility] Internal medicine

## 2018-12-29 ENCOUNTER — Other Ambulatory Visit: Payer: Self-pay | Admitting: *Deleted

## 2018-12-29 NOTE — Patient Outreach (Signed)
[facility]) Care Management  12/29/2018   Kimberly Peterson Dec 11, 1929 [id number]  RN Health Coach telephone call to patient.  [place]aa compliance verified. Per patient she is doing good. Patient is weighing daily, checking BP and Blood sugars. Patient stated that she lives with her daughter and her daughter shops for the food, cooks and takes her to the Dr. Patient is on oxygen 2 liters at all times. Patient uses a walker to ambulate. No recent falls. Patient is on a low sodium, diabetic and low cholesterol diet. Patient is now taking her medications as ordered once she cleared up a misinterpretation of the medication dosage. Patient is exercising by stretching and using rubber bands. Patiient is not having any pain. Patient appetite is good. Patient has agreed to follow up outreach calls.   Current Medications:  Current Outpatient Medications  Medication Sig Dispense Refill  . aspirin 81 MG tablet Take 81 mg by mouth daily.    Marland Kitchen atorvastatin (LIPITOR) 10 MG tablet TAKE 1 TABLET BY MOUTH ONCE DAILY FOR CHOLESTEROL 90 tablet 3  . Blood Glucose Monitoring Suppl (PRODIGY AUTOCODE BLOOD GLUCOSE) DEVI Use as directed 1 each 0  . CARTIA XT 240 MG 24 hr capsule Take 1 capsule by mouth once daily 90 capsule 0  . carvedilol (COREG) 25 MG tablet Take 1 tablet by mouth twice daily 180 tablet 0  . furosemide (LASIX) 40 MG tablet TAKE 1 TABLET BY MOUTH ONCE DAILY FOR FLUID. MAY TAKE ONE HALF TABLET EXTRA IF NEEDED 120 tablet 0  . glucose blood (PRODIGY NO CODING BLOOD GLUC) test strip 1 each by Other route 3 (three) times daily. Use as instructed 100 each 12  . hydrALAZINE (APRESOLINE) 25 MG tablet TAKE 1 & 1/2 (ONE & ONE-HALF) TABLETS BY MOUTH THREE TIMES DAILY FOR BLOOD PRESSURE 405 tablet 0  . insulin aspart protamine- aspart (NOVOLOG MIX 70/30) (70-30) 100 UNIT/ML injection Inject 26-34 Units into the skin 2 (two) times daily with a meal. 34 units in morning and 26 units in the evening    .  lisinopril (PRINIVIL,ZESTRIL) 20 MG tablet Take 1 tablet (20 mg total) by mouth daily for 30 days. 30 tablet 0  . OXYGEN Inhale into the lungs continuous.    Marland Kitchen tiotropium (SPIRIVA) 18 MCG inhalation capsule Place 1 capsule (18 mcg total) into inhaler and inhale daily. 30 capsule 12  . warfarin (COUMADIN) 5 MG tablet TAKE ONE TABLET BY MOUTH ONCE DAILY AT 5-6PM AND TAKE ONE AND ONE-HALF TABLET BY MOUTH ON THURSDAY (Patient taking differently: Take half tab every night at 6 pm.) 90 tablet 4   No current facility-administered medications for this visit.     Functional Status:  In your present state of health, do you have any difficulty performing the following activities: 12/29/2018 08/18/2018  Hearing? N N  Vision? Y Y  Difficulty concentrating or making decisions? N N  Walking or climbing stairs? Y Y  Comment patient ambulates with a walker -  Dressing or bathing? N Y  Doing errands, shopping? Y Y  Comment [name] does all errands -  Conservation officer, nature and eating ? [name]  Comment Daughter cooks food -  Using the Toilet? N N  In the past six months, have you accidently leaked urine? Y Y  Do you have problems with loss of bowel control? N N  Managing your Medications? Y Y  Comment Daughter assists -  Managing your Finances? [name]  Comment Daughter handles -  Housekeeping or managing your Housekeeping? [name]  Comment daughter handles -  Some recent data might be hidden    Fall/Depression Screening: Fall Risk  12/29/2018 10/14/2018 08/18/2018  Falls in the past year? 0 0 0  Comment - - -  Number falls in past yr: 0 - -  Injury with Fall? - - -  Risk for fall due to : Impaired balance/gait;Impaired mobility - -   PHQ 2/9 Scores 12/29/2018 10/14/2018 08/18/2018 06/09/2018 06/02/2018 01/21/2018 12/10/2017  PHQ - 2 Score 0 0 0 0 0 0 0   THN CM Care Plan Problem One     Most Recent Value  Care Plan Problem One  Knowledge Deficit in Self Management of Congestive Heart Failure  Role Documenting the Problem  One  [place] for Problem One  Active  THN Long Term Goal   Patient will not be admitted for chf exacerbation within the next 90 days  THN Long Term Goal Start Date  12/29/18  Interventions for Problem One Long Term Goal  RN discussed CHF exacerbation. Patientis aware of zones and action plan. RN willfollow up with further discussion  THN CM Short Term Goal #1   Member will take medications as prescribed over the next 4 weeks  THN CM Short Term Goal #1 Start Date  12/29/18  Interventions for Short Term Goal #1  RN discussed medication adherence. Patient and RN discussed the incorrect dosage of medication. RN will follow upwith further discussion  THN CM Short Term Goal #2   Member will verbalize working on getting new CPAP within the next 30 days  THN CM Short Term Goal #2 Start Date  12/29/18  Interventions for Short Term Goal #2  RN discussed with member about the CPAP and usage. Patient is working with Dr office to get a new one. RN will follow upwith patient for further discussion      Assessment:  Patient documents daily wts,bp and blood sugars On O2 2l per n/c at all times Uses a CPAP  Plan:  RN reiterated the CHF zones and action plan Patient will follow up with her physician for a new CPAP Patient will continue to try to eat healthy RN will follow up within the month of September RN sent barriers and assessment too PCP  [place] Management [phone]

## 2018-12-30 ENCOUNTER — Telehealth: Payer: Self-pay

## 2018-12-30 NOTE — Telephone Encounter (Signed)
Gave [facility] cpap orders for new cpap. Beth

## 2019-01-03 ENCOUNTER — Other Ambulatory Visit: Payer: Self-pay

## 2019-01-03 ENCOUNTER — Ambulatory Visit (INDEPENDENT_AMBULATORY_CARE_PROVIDER_SITE_OTHER): Payer: Medicare Other

## 2019-01-03 DIAGNOSIS — I4891 Unspecified atrial fibrillation: Secondary | ICD-10-CM | POA: Diagnosis not present

## 2019-01-03 DIAGNOSIS — I4821 Permanent atrial fibrillation: Secondary | ICD-10-CM | POA: Diagnosis not present

## 2019-01-03 DIAGNOSIS — Z7901 Long term (current) use of anticoagulants: Secondary | ICD-10-CM | POA: Diagnosis not present

## 2019-01-03 NOTE — Progress Notes (Signed)
Pt inr is 1.9 is low pt daughter advised take 1 extra tab today coumadin 5 mg she is taking 1 tab once a day

## 2019-01-06 ENCOUNTER — Ambulatory Visit: Payer: Self-pay | Admitting: Internal Medicine

## 2019-01-10 ENCOUNTER — Ambulatory Visit (INDEPENDENT_AMBULATORY_CARE_PROVIDER_SITE_OTHER): Payer: Medicare Other

## 2019-01-10 ENCOUNTER — Other Ambulatory Visit: Payer: Self-pay

## 2019-01-10 DIAGNOSIS — I4891 Unspecified atrial fibrillation: Secondary | ICD-10-CM | POA: Diagnosis not present

## 2019-01-17 ENCOUNTER — Ambulatory Visit: Payer: Medicare Other | Admitting: Nurse Practitioner

## 2019-01-19 ENCOUNTER — Ambulatory Visit (INDEPENDENT_AMBULATORY_CARE_PROVIDER_SITE_OTHER): Payer: Medicare Other

## 2019-01-19 DIAGNOSIS — I4891 Unspecified atrial fibrillation: Secondary | ICD-10-CM

## 2019-01-19 NOTE — Progress Notes (Signed)
Pt inr 2.7 continue same med as per [employer]

## 2019-01-24 ENCOUNTER — Other Ambulatory Visit: Payer: Self-pay

## 2019-01-24 ENCOUNTER — Ambulatory Visit (INDEPENDENT_AMBULATORY_CARE_PROVIDER_SITE_OTHER): Payer: Medicare Other

## 2019-01-24 DIAGNOSIS — I4891 Unspecified atrial fibrillation: Secondary | ICD-10-CM

## 2019-01-24 NOTE — Progress Notes (Signed)
Pt inr 2.2 continue same med as per [employer]

## 2019-01-26 ENCOUNTER — Ambulatory Visit: Payer: Medicare Other | Admitting: *Deleted

## 2019-01-31 ENCOUNTER — Ambulatory Visit (INDEPENDENT_AMBULATORY_CARE_PROVIDER_SITE_OTHER): Payer: Medicare Other

## 2019-01-31 ENCOUNTER — Other Ambulatory Visit: Payer: Self-pay

## 2019-01-31 DIAGNOSIS — I4821 Permanent atrial fibrillation: Secondary | ICD-10-CM | POA: Diagnosis not present

## 2019-01-31 DIAGNOSIS — I4891 Unspecified atrial fibrillation: Secondary | ICD-10-CM | POA: Diagnosis not present

## 2019-01-31 DIAGNOSIS — Z7901 Long term (current) use of anticoagulants: Secondary | ICD-10-CM

## 2019-01-31 NOTE — Progress Notes (Signed)
Pt INR 2.3 continue same med as per [employer]

## 2019-02-02 ENCOUNTER — Telehealth: Payer: Self-pay

## 2019-02-02 NOTE — Telephone Encounter (Signed)
CMN SIGNED AND PLACED IN [facility]. °

## 2019-02-07 ENCOUNTER — Inpatient Hospital Stay
Admission: EM | Admit: 2019-02-07 | Discharge: 2019-02-09 | DRG: 291 | Disposition: A | Discharge status: Home Health | Payer: Medicare Other | Attending: Internal Medicine | Admitting: Internal Medicine

## 2019-02-07 ENCOUNTER — Other Ambulatory Visit: Payer: Self-pay

## 2019-02-07 ENCOUNTER — Emergency Department: Payer: Medicare Other

## 2019-02-07 ENCOUNTER — Telehealth: Payer: Self-pay

## 2019-02-07 DIAGNOSIS — E785 Hyperlipidemia, unspecified: Secondary | ICD-10-CM | POA: Diagnosis present

## 2019-02-07 DIAGNOSIS — R7989 Other specified abnormal findings of blood chemistry: Secondary | ICD-10-CM | POA: Diagnosis not present

## 2019-02-07 DIAGNOSIS — Z7982 Long term (current) use of aspirin: Secondary | ICD-10-CM

## 2019-02-07 DIAGNOSIS — I509 Heart failure, unspecified: Secondary | ICD-10-CM | POA: Diagnosis not present

## 2019-02-07 DIAGNOSIS — J449 Chronic obstructive pulmonary disease, unspecified: Secondary | ICD-10-CM | POA: Diagnosis present

## 2019-02-07 DIAGNOSIS — Z20828 Contact with and (suspected) exposure to other viral communicable diseases: Secondary | ICD-10-CM | POA: Diagnosis present

## 2019-02-07 DIAGNOSIS — Z950 Presence of cardiac pacemaker: Secondary | ICD-10-CM

## 2019-02-07 DIAGNOSIS — I130 Hypertensive heart and chronic kidney disease with heart failure and stage 1 through stage 4 chronic kidney disease, or unspecified chronic kidney disease: Principal | ICD-10-CM | POA: Diagnosis present

## 2019-02-07 DIAGNOSIS — I110 Hypertensive heart disease with heart failure: Secondary | ICD-10-CM | POA: Diagnosis not present

## 2019-02-07 DIAGNOSIS — I480 Paroxysmal atrial fibrillation: Secondary | ICD-10-CM | POA: Diagnosis present

## 2019-02-07 DIAGNOSIS — E1122 Type 2 diabetes mellitus with diabetic chronic kidney disease: Secondary | ICD-10-CM | POA: Diagnosis present

## 2019-02-07 DIAGNOSIS — I248 Other forms of acute ischemic heart disease: Secondary | ICD-10-CM | POA: Diagnosis present

## 2019-02-07 DIAGNOSIS — I5033 Acute on chronic diastolic (congestive) heart failure: Secondary | ICD-10-CM | POA: Diagnosis present

## 2019-02-07 DIAGNOSIS — Z9981 Dependence on supplemental oxygen: Secondary | ICD-10-CM | POA: Diagnosis not present

## 2019-02-07 DIAGNOSIS — I5021 Acute systolic (congestive) heart failure: Secondary | ICD-10-CM | POA: Diagnosis not present

## 2019-02-07 DIAGNOSIS — J9601 Acute respiratory failure with hypoxia: Secondary | ICD-10-CM | POA: Diagnosis present

## 2019-02-07 DIAGNOSIS — I10 Essential (primary) hypertension: Secondary | ICD-10-CM | POA: Diagnosis not present

## 2019-02-07 DIAGNOSIS — Z79899 Other long term (current) drug therapy: Secondary | ICD-10-CM | POA: Diagnosis not present

## 2019-02-07 DIAGNOSIS — I447 Left bundle-branch block, unspecified: Secondary | ICD-10-CM | POA: Diagnosis not present

## 2019-02-07 DIAGNOSIS — Z952 Presence of prosthetic heart valve: Secondary | ICD-10-CM | POA: Diagnosis not present

## 2019-02-07 DIAGNOSIS — Z794 Long term (current) use of insulin: Secondary | ICD-10-CM

## 2019-02-07 DIAGNOSIS — N179 Acute kidney failure, unspecified: Secondary | ICD-10-CM | POA: Diagnosis not present

## 2019-02-07 DIAGNOSIS — N183 Chronic kidney disease, stage 3 (moderate): Secondary | ICD-10-CM | POA: Diagnosis present

## 2019-02-07 DIAGNOSIS — J9621 Acute and chronic respiratory failure with hypoxia: Secondary | ICD-10-CM | POA: Diagnosis present

## 2019-02-07 DIAGNOSIS — J90 Pleural effusion, not elsewhere classified: Secondary | ICD-10-CM | POA: Diagnosis not present

## 2019-02-07 DIAGNOSIS — Z7901 Long term (current) use of anticoagulants: Secondary | ICD-10-CM

## 2019-02-07 DIAGNOSIS — R0602 Shortness of breath: Secondary | ICD-10-CM | POA: Diagnosis not present

## 2019-02-07 DIAGNOSIS — E119 Type 2 diabetes mellitus without complications: Secondary | ICD-10-CM | POA: Diagnosis not present

## 2019-02-07 DIAGNOSIS — R52 Pain, unspecified: Secondary | ICD-10-CM | POA: Diagnosis not present

## 2019-02-07 DIAGNOSIS — R0902 Hypoxemia: Secondary | ICD-10-CM | POA: Diagnosis not present

## 2019-02-07 DIAGNOSIS — Z209 Contact with and (suspected) exposure to unspecified communicable disease: Secondary | ICD-10-CM | POA: Diagnosis not present

## 2019-02-07 LAB — CBC WITH DIFFERENTIAL/PLATELET
Abs Immature Granulocytes: 0.02 10*3/uL (ref 0.00–0.07)
Basophils Absolute: 0 10*3/uL (ref 0.0–0.1)
Basophils Relative: 1 %
Eosinophils Absolute: 0.1 10*3/uL (ref 0.0–0.5)
Eosinophils Relative: 2 %
HCT: 37.9 % (ref 36.0–46.0)
Hemoglobin: 11.3 g/dL — ABNORMAL LOW (ref 12.0–15.0)
Immature Granulocytes: 0 %
Lymphocytes Relative: 18 %
Lymphs Abs: 1.2 10*3/uL (ref 0.7–4.0)
MCH: 25.9 pg — ABNORMAL LOW (ref 26.0–34.0)
MCHC: 29.8 g/dL — ABNORMAL LOW (ref 30.0–36.0)
MCV: 86.9 fL (ref 80.0–100.0)
Monocytes Absolute: 0.7 10*3/uL (ref 0.1–1.0)
Monocytes Relative: 10 %
Neutro Abs: 4.9 10*3/uL (ref 1.7–7.7)
Neutrophils Relative %: 69 %
Platelets: 266 10*3/uL (ref 150–400)
RBC: 4.36 MIL/uL (ref 3.87–5.11)
RDW: 15 % (ref 11.5–15.5)
WBC: 7 10*3/uL (ref 4.0–10.5)
nRBC: 0 % (ref 0.0–0.2)

## 2019-02-07 LAB — BASIC METABOLIC PANEL
Anion gap: 11 (ref 5–15)
BUN: 15 mg/dL (ref 8–23)
CO2: 28 mmol/L (ref 22–32)
Calcium: 9.1 mg/dL (ref 8.9–10.3)
Chloride: 99 mmol/L (ref 98–111)
Creatinine, Ser: 1.02 mg/dL — ABNORMAL HIGH (ref 0.44–1.00)
GFR calc Af Amer: 57 mL/min — ABNORMAL LOW (ref >60)
GFR calc non Af Amer: 49 mL/min — ABNORMAL LOW (ref >60)
Glucose, Bld: 127 mg/dL — ABNORMAL HIGH (ref 70–99)
Potassium: 4 mmol/L (ref 3.5–5.1)
Sodium: 138 mmol/L (ref 135–145)

## 2019-02-07 LAB — HEMOGLOBIN A1C
Hgb A1c MFr Bld: 5.6 % (ref 4.8–5.6)
Mean Plasma Glucose: 114.02 mg/dL

## 2019-02-07 LAB — GLUCOSE, CAPILLARY
Glucose-Capillary: 153 mg/dL — ABNORMAL HIGH (ref 70–99)
Glucose-Capillary: 178 mg/dL — ABNORMAL HIGH (ref 70–99)
Glucose-Capillary: 265 mg/dL — ABNORMAL HIGH (ref 70–99)

## 2019-02-07 LAB — TROPONIN I (HIGH SENSITIVITY): Troponin I (High Sensitivity): 48 ng/L — ABNORMAL HIGH (ref <18)

## 2019-02-07 LAB — BRAIN NATRIURETIC PEPTIDE: B Natriuretic Peptide: 476 pg/mL — ABNORMAL HIGH (ref 0.0–100.0)

## 2019-02-07 LAB — PROTIME-INR
INR: 2.5 — ABNORMAL HIGH (ref 0.8–1.2)
Prothrombin Time: 26.6 seconds — ABNORMAL HIGH (ref 11.4–15.2)

## 2019-02-07 LAB — SARS CORONAVIRUS 2 BY RT PCR (HOSPITAL ORDER, PERFORMED IN ~~LOC~~ HOSPITAL LAB): SARS Coronavirus 2: NEGATIVE

## 2019-02-07 MED ORDER — FUROSEMIDE 10 MG/ML IJ SOLN
40.0000 mg | Freq: Two times a day (BID) | INTRAMUSCULAR | Status: DC
  Administered 2019-02-07 – 2019-02-09 (×4): 40 mg via INTRAVENOUS
  Filled 2019-02-07 (×4): qty 4

## 2019-02-07 MED ORDER — ACETAMINOPHEN 325 MG PO TABS: 650.0000 mg | ORAL_TABLET | ORAL | Status: DC | PRN

## 2019-02-07 MED ORDER — WARFARIN SODIUM 5 MG PO TABS
5.0000 mg | ORAL_TABLET | Freq: Every day | ORAL | Status: DC
  Administered 2019-02-07: 5 mg via ORAL
  Filled 2019-02-07 (×2): qty 1

## 2019-02-07 MED ORDER — INSULIN ASPART 100 UNIT/ML ~~LOC~~ SOLN
0.0000 [IU] | Freq: Three times a day (TID) | SUBCUTANEOUS | Status: DC
  Administered 2019-02-07 – 2019-02-08 (×3): 2 [IU] via SUBCUTANEOUS
  Administered 2019-02-08: 3 [IU] via SUBCUTANEOUS
  Administered 2019-02-09: 2 [IU] via SUBCUTANEOUS
  Administered 2019-02-09: 5 [IU] via SUBCUTANEOUS
  Filled 2019-02-07 (×6): qty 1

## 2019-02-07 MED ORDER — ONDANSETRON HCL 4 MG/2ML IJ SOLN: 4.0000 mg | Freq: Four times a day (QID) | INTRAMUSCULAR | Status: DC | PRN

## 2019-02-07 MED ORDER — NITROGLYCERIN 2 % TD OINT
0.5000 [in_us] | TOPICAL_OINTMENT | Freq: Once | TRANSDERMAL | Status: AC
  Administered 2019-02-07: 0.5 [in_us] via TOPICAL
  Filled 2019-02-07: qty 1

## 2019-02-07 MED ORDER — FUROSEMIDE 10 MG/ML IJ SOLN
INTRAMUSCULAR | Status: AC
  Administered 2019-02-07: 11:00:00 via INTRAVENOUS
  Filled 2019-02-07: qty 4

## 2019-02-07 MED ORDER — INSULIN ASPART 100 UNIT/ML ~~LOC~~ SOLN
0.0000 [IU] | Freq: Every day | SUBCUTANEOUS | Status: DC
  Administered 2019-02-07: 3 [IU] via SUBCUTANEOUS
  Administered 2019-02-08: 2 [IU] via SUBCUTANEOUS
  Filled 2019-02-07 (×2): qty 1

## 2019-02-07 MED ORDER — SODIUM CHLORIDE 0.9% FLUSH: 3.0000 mL | INTRAVENOUS | Status: DC | PRN

## 2019-02-07 MED ORDER — SODIUM CHLORIDE 0.9% FLUSH
3.0000 mL | Freq: Two times a day (BID) | INTRAVENOUS | Status: DC
  Administered 2019-02-07 – 2019-02-09 (×5): 3 mL via INTRAVENOUS

## 2019-02-07 MED ORDER — HYDRALAZINE HCL 20 MG/ML IJ SOLN
10.0000 mg | Freq: Four times a day (QID) | INTRAMUSCULAR | Status: DC | PRN
  Administered 2019-02-07: 10 mg via INTRAVENOUS
  Filled 2019-02-07: qty 1

## 2019-02-07 MED ORDER — FUROSEMIDE 10 MG/ML IJ SOLN
40.0000 mg | Freq: Once | INTRAMUSCULAR | Status: AC
  Administered 2019-02-07: 40 mg via INTRAVENOUS

## 2019-02-07 MED ORDER — PANTOPRAZOLE SODIUM 40 MG PO TBEC
40.0000 mg | DELAYED_RELEASE_TABLET | Freq: Every day | ORAL | Status: DC
  Administered 2019-02-07 – 2019-02-09 (×3): 40 mg via ORAL
  Filled 2019-02-07 (×3): qty 1

## 2019-02-07 MED ORDER — ATORVASTATIN CALCIUM 10 MG PO TABS
10.0000 mg | ORAL_TABLET | Freq: Every day | ORAL | Status: DC
  Administered 2019-02-07 – 2019-02-08 (×2): 10 mg via ORAL
  Filled 2019-02-07 (×2): qty 1

## 2019-02-07 MED ORDER — WARFARIN - PHARMACIST DOSING INPATIENT: Freq: Every day | Status: DC

## 2019-02-07 MED ORDER — HYDRALAZINE HCL 25 MG PO TABS
25.0000 mg | ORAL_TABLET | Freq: Three times a day (TID) | ORAL | Status: DC
  Administered 2019-02-07 – 2019-02-09 (×7): 25 mg via ORAL
  Filled 2019-02-07 (×7): qty 1

## 2019-02-07 MED ORDER — SODIUM CHLORIDE 0.9 % IV SOLN: 250.0000 mL | INTRAVENOUS | Status: DC | PRN

## 2019-02-07 MED ORDER — ASPIRIN 81 MG PO CHEW
81.0000 mg | CHEWABLE_TABLET | Freq: Every day | ORAL | Status: DC
  Administered 2019-02-07 – 2019-02-09 (×3): 81 mg via ORAL
  Filled 2019-02-07 (×3): qty 1

## 2019-02-07 MED ORDER — CARVEDILOL 25 MG PO TABS
25.0000 mg | ORAL_TABLET | Freq: Two times a day (BID) | ORAL | Status: DC
  Administered 2019-02-07 – 2019-02-09 (×5): 25 mg via ORAL
  Filled 2019-02-07 (×5): qty 1

## 2019-02-07 NOTE — ED Notes (Signed)
ED TO INPATIENT HANDOFF REPORT  ED Nurse Name and Phone #: Gwenette Greet [phone]  S Name/Age/Gender Kimberly Peterson 85 y.o. female Room/Bed: ED06A/ED06A  Code Status   Code Status: Prior  Home/SNF/Other Home Patient oriented to: self, place, time and situation Is this baseline? Yes   Triage Complete: Triage complete  Chief Complaint shortness of breath   Triage Note Pt from home with increasing SOB x 2 weeks. Bilateral lower leg swelling with pitting edema. Pt also c/o churning stomach.    Allergies No Known Allergies  Level of Care/Admitting Diagnosis ED Disposition    ED Disposition Condition Comment   Admit  The patient appears reasonably stabilized for admission considering the current resources, flow, and capabilities available in the ED at this time, and I doubt any other [facility] requiring further screening and/or treatment in the ED prior to admission is  present.       B Medical/Surgery History Past Medical History:  Diagnosis Date  . CHF (congestive heart failure) ([place])   . Chronic kidney disease   . COPD (chronic obstructive pulmonary disease) ([place])   . Diabetes mellitus without complication ([place])   . Hypertension    Past Surgical History:  Procedure Laterality Date  . aoric valve replacemet      st Jude  . CATARACT EXTRACTION, BILATERAL    . PACEMAKER PLACEMENT    . VALVE REPLACEMENT       A IV Location/Drains/Wounds Patient Lines/Drains/Airways Status   Active Line/Drains/Airways    Name:   Placement date:   Placement time:   Site:   Days:   Peripheral IV 02/07/19 Right Antecubital   02/07/19    -    Antecubital   less than 1          Intake/Output Last 24 hours  Intake/Output Summary (Last 24 hours) at 02/07/2019 1122 Last data filed at 02/07/2019 1120 Gross per 24 hour  Intake -  Output 1000 ml  Net -1000 ml    Labs/Imaging Results for orders placed or performed during the hospital encounter of 02/07/19 (from the past 48 hour(s))  CBC with  Differential     Status: Abnormal   Collection Time: 02/07/19 10:11 AM  Result Value Ref Range   WBC 7.0 4.0 - 10.5 K/uL   RBC 4.36 3.87 - 5.11 MIL/uL   Hemoglobin 11.3 (L) 12.0 - 15.0 g/dL   HCT 37.9 36.0 - 46.0 %   MCV 86.9 80.0 - 100.0 fL   MCH 25.9 (L) 26.0 - 34.0 pg   MCHC 29.8 (L) 30.0 - 36.0 g/dL   RDW 15.0 11.5 - 15.5 %   Platelets 266 150 - 400 K/uL   nRBC 0.0 0.0 - 0.2 %   Neutrophils Relative % 69 %   Neutro Abs 4.9 1.7 - 7.7 K/uL   Lymphocytes Relative 18 %   Lymphs Abs 1.2 0.7 - 4.0 K/uL   Monocytes Relative 10 %   Monocytes Absolute 0.7 0.1 - 1.0 K/uL   Eosinophils Relative 2 %   Eosinophils Absolute 0.1 0.0 - 0.5 K/uL   Basophils Relative 1 %   Basophils Absolute 0.0 0.0 - 0.1 K/uL   Immature Granulocytes 0 %   Abs Immature Granulocytes 0.02 0.00 - 0.07 K/uL    Comment: Performed at [facility], [address] [place], [place] [address]  Basic metabolic panel     Status: Abnormal   Collection Time: 02/07/19 10:11 AM  Result Value Ref Range   Sodium 138  135 - 145 mmol/L   Potassium 4.0 3.5 - 5.1 mmol/L   Chloride 99 98 - 111 mmol/L   CO2 28 22 - 32 mmol/L   Glucose, Bld 127 (H) 70 - 99 mg/dL   BUN 15 8 - 23 mg/dL   Creatinine, Ser 1.02 (H) 0.44 - 1.00 mg/dL   Calcium 9.1 8.9 - 10.3 mg/dL   GFR calc non Af Amer 49 (L) >60 mL/min   GFR calc Af Amer 57 (L) >60 mL/min   Anion gap 11 5 - 15    Comment: Performed at [facility], [place], [place] [address]  Troponin I (High Sensitivity)     Status: Abnormal   Collection Time: 02/07/19 10:11 AM  Result Value Ref Range   Troponin I (High Sensitivity) 48 (H) <18 ng/L    Comment: (NOTE) Elevated high sensitivity troponin I (hsTnI) values and significant  changes across serial measurements may suggest ACS but many other  chronic and acute conditions are known to elevate hsTnI results.  Refer to the "Links" section for chest pain algorithms and additional  guidance. Performed  at [facility], [address], [place], [place] [address]    Dg Chest Port 1 View  Result Date: 02/07/2019 CLINICAL DATA:  Shortness of breath. EXAM: PORTABLE CHEST 1 VIEW COMPARISON:  08/02/2018. FINDINGS: Cardiac pacer stable position. Prior median sternotomy. Cardiomegaly with pulmonary venous congestion and bilateral interstitial prominence. Bilateral pleural effusion noted. Similar findings noted on prior exam is suggest CHF. Pneumonitis cannot be excluded. IMPRESSION: Cardiac pacer stable position. Prior median sternotomy. Cardiomegaly with pulmonary vascular congestion, bilateral interstitial prominence, and small bilateral pleural effusions suggesting CHF. Similar findings noted on prior exam. Electronically Signed   By: Marcello Moores  Register   On: 02/07/2019 10:43    Pending Labs Unresulted Labs (From admission, onward)    Start     Ordered   02/07/19 1041  SARS Coronavirus 2 [facility] order, Performed in [facility] hospital lab)  Once,   STAT     02/07/19 1041   02/07/19 1011  Brain natriuretic peptide  (SOB)  Once,   STAT     02/07/19 1011          Vitals/Pain Today's Vitals   02/07/19 1030 02/07/19 1040 02/07/19 1050 02/07/19 1100  BP: (!) 187/83 (!) 173/77 (!) 187/74 (!) 162/72  Pulse: 80 80 78 77  Resp: (!) 32 (!) 32 (!) 29 (!) 27  Temp:      TempSrc:      SpO2: 94% 97% 98% 96%  Weight:      Height:      PainSc:        Isolation Precautions No active isolations  Medications Medications  furosemide (LASIX) injection 40 mg (40 mg Intravenous Given 02/07/19 1018)  nitroGLYCERIN (NITROGLYN) 2 % ointment 0.5 inch (0.5 inches Topical Given 02/07/19 1023)    Mobility walks with device Low fall risk   Focused Assessments see assessments   R Recommendations: See Admitting Provider Note  Report given to:   Additional Notes: Pt has purewick

## 2019-02-07 NOTE — Progress Notes (Signed)
ANTICOAGULATION CONSULT NOTE  Pharmacy Consult for Warfarin Dosing  Indication: Atrial Fibrillation   No Known Allergies  Patient Measurements: Height: 5\' 2"  (157.5 cm) Weight: 220 lb (99.8 kg) IBW/kg (Calculated) : 50.1  Vital Signs: Temp: 99.4 F (37.4 C) (08/03 1006) Temp Source: Oral (08/03 1006) BP: 152/66 (08/03 1150) Pulse Rate: 71 (08/03 1150)  Labs: Recent Labs    02/07/19 1011  HGB 11.3*  HCT 37.9  PLT 266  CREATININE 1.02*  TROPONINIHS 48*    Estimated Creatinine Clearance: 42.9 mL/min (A) (by C-G formula based on SCr of 1.02 mg/dL (H)).   Medical History: Past Medical History:  Diagnosis Date  . CHF (congestive heart failure) ([place])   . Chronic kidney disease   . COPD (chronic obstructive pulmonary disease) ([place])   . Diabetes mellitus without complication ([place])   . Hypertension     Medications:  Scheduled:  . aspirin  81 mg Oral Daily  . atorvastatin  10 mg Oral q1800  . carvedilol  25 mg Oral BID  . furosemide  40 mg Intravenous BID  . hydrALAZINE  25 mg Oral Q8H  . insulin aspart  0-5 Units Subcutaneous QHS  . insulin aspart  0-9 Units Subcutaneous TID WC  . pantoprazole  40 mg Oral Daily  . sodium chloride flush  3 mL Intravenous Q12H   Infusions:  . sodium chloride      Assessment: Pharmacy consulted for warfarin management for 85 yo female admitted with CHF and COPD exacerbation. Patient takes warfarin 5mg  all days except Thursdays when she takes 7.5mg . Patient takes warfarin for atrial fibrillation, goal INR 2-3.   Goal of Therapy:  INR 2-3 Monitor platelets by anticoagulation protocol: Yes   Plan:  Continue home dose of warfarin 5mg  daily. Will obtain INRs dailys while admitted.   Pharmacy will continue to monitor and adjust per consult.   Annaliz Aven L 02/07/2019,12:07 PM

## 2019-02-07 NOTE — ED Notes (Signed)
Pt may eat and drink per EDP. Tolerating soda and crackers well. Purewick in place.

## 2019-02-07 NOTE — ED Provider Notes (Signed)
[facility] Emergency Department Provider Note    First MD Initiated Contact with Patient 02/07/19 1015     (approximate)  I have reviewed the triage vital signs and the nursing notes.   HISTORY  Chief Complaint Shortness of Breath    HPI Kimberly Peterson is a 85 y.o. female with below list of previous medical conditions including CHF hypertension and COPD as well as chronic kidney disease presents to the emergency department secondary to progressive dyspnea over the past 2weeks.  Patient admits to orthopnea and increasing bilateral lower extremity swelling.  Patient denies any chest pain.  Patient denies any dizziness or diaphoresis.        Past Medical History:  Diagnosis Date  . CHF (congestive heart failure) ([place])   . Chronic kidney disease   . COPD (chronic obstructive pulmonary disease) ([place])   . Diabetes mellitus without complication ([place])   . Hypertension     Patient Active Problem List   Diagnosis Date Noted  . Type 2 diabetes with peripheral circulatory disorder, controlled ([place]) 10/17/2018  . Complete heart block ([place]) 09/09/2018  . Severe aortic valve stenosis 09/09/2018  . Lymphedema 08/12/2018  . Uncontrolled type 2 diabetes mellitus with hyperglycemia ([place]) 02/25/2018  . Atrial fibrillation ([place]) 02/25/2018  . Encounter for therapeutic drug monitoring 02/25/2018  . Dependence on supplemental oxygen 02/25/2018  . Lower extremity edema 02/25/2018  . CHF (congestive heart failure) ([place]) 12/04/2015  . Diabetes mellitus ([place]) 12/04/2015  . Chronic obstructive pulmonary disease ([place]) 09/20/2015  . Diastolic CHF, acute on chronic (HCC) 09/19/2015  . Benign essential HTN 09/19/2015  . Controlled diabetes mellitus without complication, with long-term current use of insulin ([place]) 09/19/2015  . Hyperlipidemia, mixed 09/04/2014  . Pulmonary embolism ([place]) 09/23/2013    Past Surgical History:  Procedure Laterality Date  . aoric valve  replacemet      st Jude  . CATARACT EXTRACTION, BILATERAL    . PACEMAKER PLACEMENT    . VALVE REPLACEMENT      Prior to Admission medications   Medication Sig Start Date End Date Taking? Authorizing Provider  aspirin 81 MG tablet Take 81 mg by mouth daily.    [provider]  atorvastatin (LIPITOR) 10 MG tablet TAKE 1 TABLET BY MOUTH ONCE DAILY FOR CHOLESTEROL 12/06/18   Kendell Bane, NP  Blood Glucose Monitoring Suppl (PRODIGY AUTOCODE BLOOD GLUCOSE) DEVI Use as directed 07/27/17   [name], MD  CARTIA XT 240 MG 24 hr capsule Take 1 capsule by mouth once daily 12/06/18   [name], MD  carvedilol (COREG) 25 MG tablet Take 1 tablet by mouth twice daily 12/06/18   [name], MD  furosemide (LASIX) 40 MG tablet TAKE 1 TABLET BY MOUTH ONCE DAILY FOR FLUID. MAY TAKE ONE HALF TABLET EXTRA IF NEEDED 12/06/18   [name], MD  glucose blood (PRODIGY NO CODING BLOOD GLUC) test strip 1 each by Other route 3 (three) times daily. Use as instructed 07/27/17   [name], MD  hydrALAZINE (APRESOLINE) 25 MG tablet TAKE 1 & 1/2 (ONE & ONE-HALF) TABLETS BY MOUTH THREE TIMES DAILY FOR BLOOD PRESSURE 12/06/18   [name], MD  insulin aspart protamine- aspart (NOVOLOG MIX 70/30) (70-30) 100 UNIT/ML injection Inject 26-34 Units into the skin 2 (two) times daily with a meal. 34 units in morning and 26 units in the evening    [provider]  lisinopril (PRINIVIL,ZESTRIL) 20 MG tablet  Take 1 tablet (20 mg total) by mouth daily for 30 days. 08/09/18 09/08/18  Saundra Shelling, MD  OXYGEN Inhale into the lungs continuous.    [provider]  tiotropium (SPIRIVA) 18 MCG inhalation capsule Place 1 capsule (18 mcg total) into inhaler and inhale daily. 12/06/15   Epifanio Lesches, MD  warfarin (COUMADIN) 5 MG tablet TAKE ONE TABLET BY MOUTH ONCE DAILY AT 5-6PM AND TAKE ONE AND ONE-HALF TABLET BY MOUTH ON THURSDAY Patient taking differently: Take half tab every night at 6 pm. 12/30/17    Ronnell Freshwater, NP    Allergies Patient has no known allergies.  History reviewed. No pertinent family history.  Social History Social History   Tobacco Use  . Smoking status: Never Smoker  . Smokeless tobacco: Never Used  Substance Use Topics  . Alcohol use: No  . Drug use: No    Review of Systems Constitutional: No fever/chills Eyes: No visual changes. ENT: No sore throat. Cardiovascular: Denies chest pain. Respiratory: Positive for dyspnea Gastrointestinal: No abdominal pain.  No nausea, no vomiting.  No diarrhea.  No constipation. Genitourinary: Negative for dysuria. Musculoskeletal: Negative for neck pain.  Negative for back pain. Integumentary: Negative for rash. Neurological: Negative for headaches, focal weakness or numbness.  ____________________________________________   PHYSICAL EXAM:  VITAL SIGNS: ED Triage Vitals  Enc Vitals Group     BP 02/07/19 1006 (!) 191/85     Pulse Rate 02/07/19 1004 94     Resp 02/07/19 1006 (!) 31     Temp 02/07/19 1006 99.4 F (37.4 C)     Temp Source 02/07/19 1006 Oral     SpO2 02/07/19 1006 94 %     Weight 02/07/19 1004 95.3 kg (210 lb)     Height 02/07/19 1004 1.575 m (5\' 2" )     Head Circumference --      Peak Flow --      Pain Score 02/07/19 1010 0     Pain Loc --      Pain Edu? --      Excl. in [place]? --     Constitutional: Alert and oriented.  Apparent dyspnea Eyes: Conjunctivae are normal.  Mouth/Throat: Mucous membranes are moist.  Oropharynx non-erythematous. Neck: No stridor.  Cardiovascular: Normal rate, regular rhythm. Good peripheral circulation. Grossly normal heart sounds. Respiratory: Normal respiratory effort.  No retractions.  Bibasilar rales. Gastrointestinal: Soft and nontender. No distention.  Musculoskeletal: 2+ bilateral lower extremity pitting edema. Neurologic:  Normal speech and language. No gross focal neurologic deficits are appreciated.  Skin:  Skin is warm, dry and intact. No rash  noted. Psychiatric: Mood and affect are normal. Speech and behavior are normal.  ____________________________________________   LABS (all labs ordered are listed, but only abnormal results are displayed)  Labs Reviewed  CBC WITH DIFFERENTIAL/PLATELET - Abnormal; Notable for the following components:      Result Value   Hemoglobin 11.3 (*)    MCH 25.9 (*)    MCHC 29.8 (*)    All other components within normal limits  BASIC METABOLIC PANEL - Abnormal; Notable for the following components:   Glucose, Bld 127 (*)    Creatinine, Ser 1.02 (*)    GFR calc non Af Amer 49 (*)    GFR calc Af Amer 57 (*)    All other components within normal limits  TROPONIN I (HIGH SENSITIVITY) - Abnormal; Notable for the following components:   Troponin I (High Sensitivity) 48 (*)    All other  components within normal limits  BRAIN NATRIURETIC PEPTIDE   ____________________________________________  EKG  ED ECG REPORT I, [place] Charika Mikelson, the attending physician, personally viewed and interpreted this ECG.   Date: 02/07/2019  EKG Time: 10:08AM  Rate: 87  Rhythm: Atrial sensed ventricular paced rhythm  Axis: Normal  Intervals: Normal  ST&T Change: None  ____________________________________________  RADIOLOGY I, Pettis Ernst Bowler, personally viewed and evaluated these images (plain radiographs) as part of my medical decision making, as well as reviewing the written report by the radiologist.  ED MD interpretation:    Official radiology report(s): Dg Chest Port 1 View  Result Date: 02/07/2019 CLINICAL DATA:  Shortness of breath. EXAM: PORTABLE CHEST 1 VIEW COMPARISON:  08/02/2018. FINDINGS: Cardiac pacer stable position. Prior median sternotomy. Cardiomegaly with pulmonary venous congestion and bilateral interstitial prominence. Bilateral pleural effusion noted. Similar findings noted on prior exam is suggest CHF. Pneumonitis cannot be excluded. IMPRESSION: Cardiac pacer stable position. Prior  median sternotomy. Cardiomegaly with pulmonary vascular congestion, bilateral interstitial prominence, and small bilateral pleural effusions suggesting CHF. Similar findings noted on prior exam. Electronically Signed   By: Marcello Moores  Register   On: 02/07/2019 10:43    Procedures   ____________________________________________   INITIAL IMPRESSION / MDM / [place] / ED COURSE  As part of my medical decision making, I reviewed the following data within the electronic MEDICAL RECORD NUMBER    85 year old female presented with above-stated history and physical exam consistent with acute CHF exacerbation.  Patient given 40 mg IV Lasix with approximately 500 mL's of urine obtained.  Half inch of Nitropaste applied to the patient's chest with improvement in blood pressure current BP 173/77 which is improved from systolic of 003 on arrival.  Patient states that "I feel much better".  Patient discussed with hospital staff for hospital admission further evaluation and management of his CHF exacerbation.     ____________________________________________  FINAL CLINICAL IMPRESSION(S) / ED DIAGNOSES  Final diagnoses:  Acute on chronic congestive heart failure, unspecified heart failure type ([place])     MEDICATIONS GIVEN DURING THIS VISIT:  Medications  furosemide (LASIX) injection 40 mg (40 mg Intravenous Given 02/07/19 1018)  nitroGLYCERIN (NITROGLYN) 2 % ointment 0.5 inch (0.5 inches Topical Given 02/07/19 1023)     ED Discharge Orders    None      *Please note:  NICOLASA MILBRATH was evaluated in Emergency Department on 02/07/2019 for the symptoms described in the history of present illness. She was evaluated in the context of the global COVID-19 pandemic, which necessitated consideration that the patient might be at risk for infection with the SARS-CoV-2 virus that causes COVID-19. Institutional protocols and algorithms that pertain to the evaluation of patients at risk for COVID-19 are in a  state of rapid change based on information released by regulatory bodies including the CDC and federal and state organizations. These policies and algorithms were followed during the patient's care in the ED.  Some ED evaluations and interventions may be delayed as a result of limited staffing during the pandemic.*  Note:  This document was prepared using Dragon voice recognition software and may include unintentional dictation errors.   Gregor Hams, MD 02/07/19 1113

## 2019-02-07 NOTE — ED Notes (Signed)
Pt's daughter, Noe Gens, updated.

## 2019-02-07 NOTE — Evaluation (Signed)
Physical Therapy Evaluation Patient Details Name: Kimberly Peterson MRN: [id number] DOB: Mar 13, 1930 Today's Date: 02/07/2019   History of Present Illness  presented to ER secondary to progressive SOB/dyspnea, LE edema x2 weeks; admitted for management of acute/chronic heart failure.  Clinical Impression  Upon evaluation, patient alert and oriented; follows commands and demonstrates good effort with all mobility tasks.  LEs generally edematous, but strength and ROM grossly functional for basic transfers and gait.  Able to complete bed mobility with close sup; sit/stand, basic transfers and short-distance gait (5' x3) with RW, cga/min assist.   Marked SOB with exertion, desat to 83% with simple bed/BSC movement; recovers >90% within 60 second seated rest and pursed lip breathing.  Additional activity deferred as a result; will continue to assess/progress as medically appropriate. Would benefit from skilled PT to address above deficits and promote optimal return to PLOF; Recommend transition to [facility] upon discharge from acute hospitalization.     Follow Up Recommendations Home health PT    Equipment Recommendations       Recommendations for Other Services       Precautions / Restrictions Precautions Precautions: Fall Restrictions Weight Bearing Restrictions: No      Mobility  Bed Mobility Overal bed mobility: Needs Assistance Bed Mobility: Supine to Sit     Supine to sit: Supervision        Transfers Overall transfer level: Needs assistance Equipment used: Rolling walker (2 wheeled) Transfers: Sit to/from Stand Sit to Stand: Min assist         General transfer comment: assist for initial lift off  Ambulation/Gait Ambulation/Gait assistance: Min guard;Min assist Gait Distance (Feet): 5 Feet(x3) Assistive device: Rolling walker (2 wheeled)       General Gait Details: broad BOS, forward flexed posture; short, choppy steps.  Marked SOB with exertion, desat to 83% with  simple bed/BSC movement; recovers >90% within 60 second seated rest and pursed lip breathing.  Stairs            Wheelchair Mobility    Modified Rankin (Stroke Patients Only)       Balance Overall balance assessment: Needs assistance Sitting-balance support: No upper extremity supported;Feet supported Sitting balance-Leahy Scale: Good     Standing balance support: Bilateral upper extremity supported Standing balance-Leahy Scale: Fair                               Pertinent Vitals/Pain Pain Assessment: No/denies pain    Home Living Family/patient expects to be discharged to:: Private residence Living Arrangements: Children Available Help at Discharge: Family;Available PRN/intermittently Type of Home: House Home Access: Level entry     Home Layout: One level Home Equipment: Walker - 4 wheels      Prior Function Level of Independence: Independent with assistive device(s)         Comments: Mod indep with 4WRW for ADLs, household mobilization; home O2 at 2L; denies recent fall history.     Hand Dominance        Extremity/Trunk Assessment   Upper Extremity Assessment Upper Extremity Assessment: Overall WFL for tasks assessed    Lower Extremity Assessment Lower Extremity Assessment: Overall WFL for tasks assessed(grossly at least 4-/5; significant pitting edema throughout bilat LEs and feet)       Communication   Communication: HOH  Cognition Arousal/Alertness: Awake/alert Behavior During Therapy: WFL for tasks assessed/performed Overall Cognitive Status: Within Functional Limits for tasks assessed  General Comments      Exercises Other Exercises Other Exercises: Toilet transfer, SPT with RW, cga/min assist; sit/stand from [facility] with RW, cga/min assist.  Standing balance for hygiene, cga/close sup with RW.   Assessment/Plan    PT Assessment Patient needs continued PT services   PT Problem List Decreased strength;Decreased activity tolerance;Decreased balance;Decreased mobility;Decreased knowledge of use of DME;Obesity;Cardiopulmonary status limiting activity;Decreased knowledge of precautions;Decreased safety awareness       PT Treatment Interventions DME instruction;Gait training;Functional mobility training;Therapeutic activities;Therapeutic exercise;Balance training;Patient/family education    PT Goals (Current goals can be found in the Care Plan section)  Acute Rehab PT Goals Patient Stated Goal: to return home PT Goal Formulation: With patient Time For Goal Achievement: 02/21/19 Potential to Achieve Goals: Good    Frequency Min 2X/week   Barriers to discharge        Co-evaluation               AM-PAC PT "6 Clicks" Mobility  Outcome Measure Help needed turning from your back to your side while in a flat bed without using bedrails?: None Help needed moving from lying on your back to sitting on the side of a flat bed without using bedrails?: None Help needed moving to and from a bed to a chair (including a wheelchair)?: A Little Help needed standing up from a chair using your arms (e.g., wheelchair or bedside chair)?: A Little Help needed to walk in hospital room?: A Little Help needed climbing 3-5 steps with a railing? : A Little 6 Click Score: 20    End of Session Equipment Utilized During Treatment: Gait belt;Oxygen Activity Tolerance: Patient tolerated treatment well Patient left: in chair;with call bell/phone within reach;with chair alarm set Nurse Communication: Mobility status PT Visit Diagnosis: Muscle weakness (generalized) (M62.81);Difficulty in walking, not elsewhere classified (R26.2)    Time: 5732-2025 PT Time Calculation (min) (ACUTE ONLY): 22 min   Charges:   PT Evaluation $PT Eval Moderate Complexity: 1 Mod PT Treatments $Therapeutic Activity: 8-22 mins       Diany Formosa H. Owens Shark, PT, DPT, NCS 02/07/19, 4:38  PM [phone]

## 2019-02-07 NOTE — Telephone Encounter (Signed)
Pt daughter called that pt had lost of appetite ,shortness of breath hardly walk to bathroom and her leg swelling as per adam advised pt daughet go to ED

## 2019-02-07 NOTE — H&P (Addendum)
[facility] at [place] NAME: Kimberly Peterson    MR#:  [id number]  DATE OF BIRTH:  11/27/1929  DATE OF ADMISSION:  02/07/2019  PRIMARY CARE PHYSICIAN: [name], MD   REQUESTING/REFERRING PHYSICIAN:   CHIEF COMPLAINT:   Chief Complaint  Patient presents with  . Shortness of Breath    HISTORY OF PRESENT ILLNESS:   85 year old female with past medical history significant for congestive heart failure, aortic valve disease status post valve replacement at [facility], complete AV block status post permanent pacemaker insertion, oxygen dependent COPD, diabetes mellitus, PE, atrial fibrillation on Coumadin, hypertension and CKD presenting to the ED with worsening shortness of breath and lower extremity swelling x 2 weeks.  On arrival to the ED, he was afebrile with blood pressure 191/85 mm Hg and pulse rate 94 beats/min. There were no focal neurological deficits; she was alert and oriented x4, and he did not demonstrate any memory deficits.  She was notably short of breath with increased work of breathing.  Denies any dizziness, chest pain, cough or diaphoresis.  Initial labs revealed unremarkable CBC, creatinine 1.02, troponin 48, BNP 476.  Chest x-ray showed pulmonary vascular congestion.  Patient received IV Lasix 80 mg in the ER she is being admitted for further evaluation and management.  PAST MEDICAL HISTORY:   Past Medical History:  Diagnosis Date  . CHF (congestive heart failure) ([place])   . Chronic kidney disease   . COPD (chronic obstructive pulmonary disease) ([place])   . Diabetes mellitus without complication ([place])   . Hypertension     PAST SURGICAL HISTORY:   Past Surgical History:  Procedure Laterality Date  . aoric valve replacemet      st Jude  . CATARACT EXTRACTION, BILATERAL    . PACEMAKER PLACEMENT    . VALVE REPLACEMENT      SOCIAL HISTORY:   Social History   Tobacco Use  . Smoking status: Never Smoker  . Smokeless  tobacco: Never Used  Substance Use Topics  . Alcohol use: No    FAMILY HISTORY:  History reviewed. No pertinent family history.  DRUG ALLERGIES:  No Known Allergies  REVIEW OF SYSTEMS:   Review of Systems  Constitutional: Negative for chills, fever, malaise/fatigue and weight loss.  HENT: Negative for congestion, hearing loss and sore throat.   Eyes: Negative for blurred vision and double vision.  Respiratory: Positive for shortness of breath. Negative for cough and wheezing.   Cardiovascular: Positive for orthopnea and leg swelling. Negative for chest pain and palpitations.  Gastrointestinal: Negative for abdominal pain, diarrhea, nausea and vomiting.  Genitourinary: Negative for dysuria and urgency.  Musculoskeletal: Negative for myalgias.  Skin: Negative for rash.  Neurological: Negative for dizziness, sensory change, speech change, focal weakness and headaches.  Psychiatric/Behavioral: Negative for depression.   MEDICATIONS AT HOME:   Prior to Admission medications   Medication Sig Start Date End Date Taking? Authorizing Provider  aspirin 81 MG tablet Take 81 mg by mouth daily.   Yes [provider]  atorvastatin (LIPITOR) 10 MG tablet TAKE 1 TABLET BY MOUTH ONCE DAILY FOR CHOLESTEROL 12/06/18  Yes Kendell Bane, NP  carvedilol (COREG) 25 MG tablet Take 1 tablet by mouth twice daily 12/06/18  Yes [name], MD  furosemide (LASIX) 40 MG tablet TAKE 1 TABLET BY MOUTH ONCE DAILY FOR FLUID. MAY TAKE ONE HALF TABLET EXTRA IF NEEDED 12/06/18  Yes [name], MD  hydrALAZINE (APRESOLINE)  25 MG tablet TAKE 1 & 1/2 (ONE & ONE-HALF) TABLETS BY MOUTH THREE TIMES DAILY FOR BLOOD PRESSURE 12/06/18  Yes [name], MD  insulin aspart protamine- aspart (NOVOLOG MIX 70/30) (70-30) 100 UNIT/ML injection Inject 21-34 Units into the skin 2 (two) times daily with a meal. 34 units in morning and 21 units in the evening   Yes [provider]  pantoprazole (PROTONIX) 40 MG  tablet Take 40 mg by mouth daily. 06/09/18  Yes [provider]  warfarin (COUMADIN) 5 MG tablet TAKE ONE TABLET BY MOUTH ONCE DAILY AT 5-6PM AND TAKE ONE AND ONE-HALF TABLET BY MOUTH ON THURSDAY 12/30/17  Yes Boscia, Heather E, NP      VITAL SIGNS:  Blood pressure 131/82, pulse 90, temperature 98 F (36.7 C), resp. rate 20, height 5\' 2"  (1.575 m), weight 94.5 kg, SpO2 100 %.  PHYSICAL EXAMINATION:   Physical Exam  GENERAL:  85 y.o.-year-old patient lying in the bed with no acute distress.  EYES: Pupils equal, round, reactive to light and accommodation. No scleral icterus. Extraocular muscles intact.  HEENT: Head atraumatic, normocephalic. Oropharynx and nasopharynx clear.  NECK:  Supple, no jugular venous distention. No thyroid enlargement, no tenderness.  LUNGS: Decreased breath sounds bilaterally, no wheezing, mild rales,rhonchi or crepitation. Mild use of accessory muscles of respiration.  CARDIOVASCULAR: S1, S2 normal. No murmurs, rubs, or gallops.  ABDOMEN: Soft, nontender, nondistended. Bowel sounds present. No organomegaly or mass.  EXTREMITIES: Bilateral pitting edema, cyanosis, or clubbing. No rash or lesions. + pedal pulses MUSCULOSKELETAL: Normal bulk, and power was 5+ grip and elbow, knee, and ankle flexion and extension bilaterally.  NEUROLOGIC:Alert and oriented x 3. CN 2-12 intact. Sensation to light touch and cold stimuli intact bilaterally. Babinski is downgoing. DTR's (biceps, patellar, and achilles) 2+ and symmetric throughout. Gait not tested due to safety concern. PSYCHIATRIC: The patient is alert and oriented x 3.  SKIN: No obvious rash, lesion, or ulcer.   DATA REVIEWED:  LABORATORY PANEL:   CBC Recent Labs  Lab 02/07/19 1011  WBC 7.0  HGB 11.3*  HCT 37.9  PLT 266   ------------------------------------------------------------------------------------------------------------------  Chemistries  Recent Labs  Lab 02/07/19 1011  NA 138  K 4.0   CL 99  CO2 28  GLUCOSE 127*  BUN 15  CREATININE 1.02*  CALCIUM 9.1   ------------------------------------------------------------------------------------------------------------------  Cardiac Enzymes No results for input(s): TROPONINI in the last 168 hours. ------------------------------------------------------------------------------------------------------------------  RADIOLOGY:  Dg Chest Port 1 View  Result Date: 02/07/2019 CLINICAL DATA:  Shortness of breath. EXAM: PORTABLE CHEST 1 VIEW COMPARISON:  08/02/2018. FINDINGS: Cardiac pacer stable position. Prior median sternotomy. Cardiomegaly with pulmonary venous congestion and bilateral interstitial prominence. Bilateral pleural effusion noted. Similar findings noted on prior exam is suggest CHF. Pneumonitis cannot be excluded. IMPRESSION: Cardiac pacer stable position. Prior median sternotomy. Cardiomegaly with pulmonary vascular congestion, bilateral interstitial prominence, and small bilateral pleural effusions suggesting CHF. Similar findings noted on prior exam. Electronically Signed   By: [place]   On: 02/07/2019 10:43    EKG:  EKG: unchanged from previous tracings, AV paced. Vent. rate 87 BPM PR interval * ms QRS duration 150 ms QT/QTc 413/497 ms P-R-T axes 3 244 71 IMPRESSION AND PLAN:   85 y.o. female medical history significant for congestive heart failure, aortic valve disease status post valve replacement at [facility], complete AV block status post permanent pacemaker insertion, oxygen dependent COPD, diabetes mellitus, PE, atrial fibrillation on Coumadin, hypertension and CKD presenting to  the ED with worsening shortness of breath and lower extremity swelling.  1.  Acute on chronic hypoxic respiratory failure -secondary to CHF exacerbation - Admit to telemetry unit - Supplemental O2, goal sat 88-92%  2. Acute on chronic Diastolic Congestive Heart Failure: Acute presentation likely due to volume overload  with associated symptoms of SOB, BLE edema and. BNP elevated at 476 - Chest x-ray shows pulmonary vascular congestion    Last Echo 08/03/2018 , EF 55-65% - Beta-Blockade: Coreg - Diuretics: Furosemide 40mg  IV BID. Diureses >1L negative per day until approach euvolemia / worsening renal function. - Echocardiogram pending - Low salt diet  - Check daily weight - Strict I&Os - CHF Teaching - Cardiology Consult with Dr.Fath  3. COPD -no evidence of exacerbation - Supplemental oxygen - Bronchodilators (albuterol/ipratropium) standing and PRN  4. Elevated troponin - Likely due to demand ischemia  5. Paroxysmal Atrial fibrillation -on Coumadin - Check PT/INR  6. DM: sugars have been well-controlled on current regimen  - Low intensity sliding scale insulin coverage - FS BS qac and qhs   7. DVT prophylaxis - Therapeutically anti-coagulated with warfarin     All the records are reviewed and case discussed with ED provider. Management plans discussed with the patient, family and they are in agreement.  CODE STATUS: FULL  TOTAL TIME TAKING CARE OF THIS PATIENT: 50 minutes.    on 02/07/2019 at 8:04 PM  Rufina Falco, DNP, FNP-BC [facility] Nurse Practitioner Between 7am to 6pm - Pager [phone]  After 6pm go to [email or web address] - password [email or web address] [facility]  Office  [phone]  CC: Primary care physician; [name], MD

## 2019-02-07 NOTE — ED Triage Notes (Signed)
Pt from home with increasing SOB x 2 weeks. Bilateral lower leg swelling with pitting edema. Pt also c/o churning stomach.

## 2019-02-08 ENCOUNTER — Inpatient Hospital Stay
Admit: 2019-02-08 | Discharge: 2019-02-08 | Disposition: A | Discharge status: Home / Self Care | Payer: Medicare Other | Attending: Nurse Practitioner | Admitting: Nurse Practitioner

## 2019-02-08 DIAGNOSIS — I5021 Acute systolic (congestive) heart failure: Secondary | ICD-10-CM

## 2019-02-08 LAB — GLUCOSE, CAPILLARY
Glucose-Capillary: 168 mg/dL — ABNORMAL HIGH (ref 70–99)
Glucose-Capillary: 247 mg/dL — ABNORMAL HIGH (ref 70–99)
Glucose-Capillary: 91 mg/dL (ref 70–99)
Glucose-Capillary: 247 mg/dL — ABNORMAL HIGH (ref 70–99)

## 2019-02-08 LAB — ECHOCARDIOGRAM COMPLETE
Height: 62 in
Weight: 3334.4 oz

## 2019-02-08 LAB — BASIC METABOLIC PANEL
Anion gap: 7 (ref 5–15)
BUN: 19 mg/dL (ref 8–23)
CO2: 33 mmol/L — ABNORMAL HIGH (ref 22–32)
Calcium: 8.7 mg/dL — ABNORMAL LOW (ref 8.9–10.3)
Chloride: 98 mmol/L (ref 98–111)
Creatinine, Ser: 0.94 mg/dL (ref 0.44–1.00)
GFR calc Af Amer: >60 mL/min (ref >60)
GFR calc non Af Amer: 55 mL/min — ABNORMAL LOW (ref >60)
Glucose, Bld: 178 mg/dL — ABNORMAL HIGH (ref 70–99)
Potassium: 4 mmol/L (ref 3.5–5.1)
Sodium: 138 mmol/L (ref 135–145)

## 2019-02-08 LAB — CBC
HCT: 36.4 % (ref 36.0–46.0)
Hemoglobin: 10.6 g/dL — ABNORMAL LOW (ref 12.0–15.0)
MCH: 25.9 pg — ABNORMAL LOW (ref 26.0–34.0)
MCHC: 29.1 g/dL — ABNORMAL LOW (ref 30.0–36.0)
MCV: 89 fL (ref 80.0–100.0)
Platelets: 216 10*3/uL (ref 150–400)
RBC: 4.09 MIL/uL (ref 3.87–5.11)
RDW: 15.1 % (ref 11.5–15.5)
WBC: 6.1 10*3/uL (ref 4.0–10.5)
nRBC: 0 % (ref 0.0–0.2)

## 2019-02-08 LAB — PROTIME-INR
INR: 2.8 — ABNORMAL HIGH (ref 0.8–1.2)
Prothrombin Time: 28.9 seconds — ABNORMAL HIGH (ref 11.4–15.2)

## 2019-02-08 MED ORDER — PERFLUTREN LIPID MICROSPHERE
1.0000 mL | INTRAVENOUS | Status: AC | PRN
  Administered 2019-02-08: 10:00:00 3 mL via INTRAVENOUS
  Filled 2019-02-08: qty 10

## 2019-02-08 MED ORDER — WARFARIN SODIUM 5 MG PO TABS
5.0000 mg | ORAL_TABLET | Freq: Once | ORAL | Status: AC
  Administered 2019-02-08: 5 mg via ORAL
  Filled 2019-02-08: qty 1

## 2019-02-08 NOTE — Progress Notes (Signed)
[facility] at [place] NAME: Kimberly Peterson    MR#:  [id number]  DATE OF BIRTH:  04-Nov-1929  SUBJECTIVE:  CHIEF COMPLAINT:   Chief Complaint  Patient presents with  . Shortness of Breath   Came with worsening SOB, leg swelling. Already feels some better today. On home oxygen.  REVIEW OF SYSTEMS:  CONSTITUTIONAL: No fever, fatigue or weakness.  EYES: No blurred or double vision.  EARS, NOSE, AND THROAT: No tinnitus or ear pain.  RESPIRATORY: No cough,have shortness of breath,no wheezing or hemoptysis.  CARDIOVASCULAR: No chest pain, orthopnea, edema.  GASTROINTESTINAL: No nausea, vomiting, diarrhea or abdominal pain.  GENITOURINARY: No dysuria, hematuria.  ENDOCRINE: No polyuria, nocturia,  HEMATOLOGY: No anemia, easy bruising or bleeding SKIN: No rash or lesion. MUSCULOSKELETAL: No joint pain or arthritis.   NEUROLOGIC: No tingling, numbness, weakness.  PSYCHIATRY: No anxiety or depression.   ROS  DRUG ALLERGIES:  No Known Allergies  VITALS:  Blood pressure (!) 153/69, pulse 72, temperature 98 F (36.7 C), temperature source Oral, resp. rate 18, height 5\' 2"  (1.575 m), weight 94.5 kg, SpO2 98 %.  PHYSICAL EXAMINATION:  GENERAL:  85 y.o.-year-old patient lying in the bed with no acute distress.  EYES: Pupils equal, round, reactive to light and accommodation. No scleral icterus. Extraocular muscles intact.  HEENT: Head atraumatic, normocephalic. Oropharynx and nasopharynx clear.  NECK:  Supple, no jugular venous distention. No thyroid enlargement, no tenderness.  LUNGS: Normal breath sounds bilaterally, no wheezing, have crepitation. No use of accessory muscles of respiration.  CARDIOVASCULAR: S1, S2 normal. No murmurs, rubs, or gallops.  ABDOMEN: Soft, nontender, nondistended. Bowel sounds present. No organomegaly or mass.  EXTREMITIES: Have some pedal edema, no cyanosis, or clubbing.  NEUROLOGIC: Cranial nerves II through XII are  intact. Muscle strength 5/5 in all extremities. Sensation intact. Gait not checked.  PSYCHIATRIC: The patient is alert and oriented x 3.  SKIN: No obvious rash, lesion, or ulcer.   Physical Exam LABORATORY PANEL:   CBC Recent Labs  Lab 02/08/19 0614  WBC 6.1  HGB 10.6*  HCT 36.4  PLT 216   ------------------------------------------------------------------------------------------------------------------  Chemistries  Recent Labs  Lab 02/08/19 0614  NA 138  K 4.0  CL 98  CO2 33*  GLUCOSE 178*  BUN 19  CREATININE 0.94  CALCIUM 8.7*   ------------------------------------------------------------------------------------------------------------------  Cardiac Enzymes No results for input(s): TROPONINI in the last 168 hours. ------------------------------------------------------------------------------------------------------------------  RADIOLOGY:  Dg Chest Port 1 View  Result Date: 02/07/2019 CLINICAL DATA:  Shortness of breath. EXAM: PORTABLE CHEST 1 VIEW COMPARISON:  08/02/2018. FINDINGS: Cardiac pacer stable position. Prior median sternotomy. Cardiomegaly with pulmonary venous congestion and bilateral interstitial prominence. Bilateral pleural effusion noted. Similar findings noted on prior exam is suggest CHF. Pneumonitis cannot be excluded. IMPRESSION: Cardiac pacer stable position. Prior median sternotomy. Cardiomegaly with pulmonary vascular congestion, bilateral interstitial prominence, and small bilateral pleural effusions suggesting CHF. Similar findings noted on prior exam. Electronically Signed   By: [place]   On: 02/07/2019 10:43    ASSESSMENT AND PLAN:   Active Problems:   Acute respiratory failure with hypoxia ([place])  85 y.o. female medical history significant for congestive heart failure, aortic valve disease status post valve replacement at [facility], complete AV block status post permanent pacemaker insertion, oxygen dependent COPD, diabetes  mellitus, PE, atrial fibrillation on Coumadin, hypertension and CKD presenting to the ED with worsening shortness of breath and lower extremity swelling.  1.  Acute  on chronic hypoxic respiratory failure -secondary to CHF exacerbation - Supplemental O2, goal sat 88-92% - she is on Home O2.  2. Acute on chronicDiastolicCongestive Heart Failure: Acute presentation likely due to volume overload with associated symptoms of SOB, BLE edema and. BNPelevated at 476 - Chest x-ray shows pulmonary vascular congestion Last Echo1/28/2020 , EF 55-65% - Beta-Blockade: Coreg - Diuretics: Furosemide40mg  IVBID. Diureses >1L negative per day until approach euvolemia / worsening renal function. - Low salt diet - Check daily weight - Strict I&Os - CHF Teaching -CardiologyConsult with Dr.Fath  3. COPD -no evidence of exacerbation - Supplemental oxygen - Bronchodilators (albuterol/ipratropium) standing and PRN  4. Elevated troponin - Likely due to demand ischemia  5. Paroxysmal Atrial fibrillation -on Coumadin - Check PT/INR  6. DM: sugars have been well-controlled on current regimen  - Low intensity sliding scale insulin coverage - FS BS qac and qhs   7. DVT prophylaxis - Therapeutically anti-coagulated with warfarin      All the records are reviewed and case discussed with Care Management/Social Workerr. Management plans discussed with the patient, family and they are in agreement.  CODE STATUS: Partial- OK for CPR and defibrillation but no Intubation / vent support.  TOTAL TIME TAKING CARE OF THIS PATIENT: 35 minutes.     POSSIBLE D/C IN 1-2 DAYS, DEPENDING ON CLINICAL CONDITION.   Vaughan Basta M.D on 02/08/2019   Between 7am to 6pm - Pager - [phone]  After 6pm go to [email or web address] - password [email or web address] [facility]  Office  [phone]  CC: Primary care physician; [name], MD  Note: This dictation was prepared with Dragon  dictation along with smaller phrase technology. Any transcriptional errors that result from this process are unintentional.

## 2019-02-08 NOTE — Progress Notes (Signed)
Inpatient Diabetes Program Recommendations  AACE/ADA: New Consensus Statement on Inpatient Glycemic Control   Target Ranges:  Prepandial:   less than 140 mg/dL      Peak postprandial:   less than 180 mg/dL (1-2 hours)      Critically ill patients:  140 - 180 mg/dL   Results for SATIVA, GELLES (MRN [id number]) as of 02/08/2019 12:09  Ref. Range 02/07/2019 12:50 02/07/2019 16:51 02/07/2019 20:56 02/08/2019 07:53 02/08/2019 11:31  Glucose-Capillary Latest Ref Range: 70 - 99 mg/dL 153 (H) 178 (H) 265 (H) 168 (H) 247 (H)   Review of Glycemic Control  Diabetes history: DM2 Outpatient Diabetes medications: 70/30 34 units QAM, 70/30 21 units QPM Current orders for Inpatient glycemic control: Novolog 0-9 units TID with meals, Novolog 0-5 units QHS  Inpatient Diabetes Program Recommendations:   Insulin-Meal Coverage: Please consider ordering Novolog 3 units TID with meals for meal coverage if patient eats at least 50% of meals.  Thanks, Barnie Alderman, RN, MSN, CDE Diabetes Coordinator Inpatient Diabetes Program [phone] (Team Pager from 8am to 5pm)

## 2019-02-08 NOTE — Progress Notes (Signed)
ANTICOAGULATION CONSULT NOTE  Pharmacy Consult for Warfarin Dosing  Indication: Atrial Fibrillation   No Known Allergies  Patient Measurements: Height: 5\' 2"  (157.5 cm) Weight: 208 lb 6.4 oz (94.5 kg) IBW/kg (Calculated) : 50.1  Vital Signs: Temp: 98 F (36.7 C) (08/04 0756) Temp Source: Oral (08/04 0756) BP: 128/58 (08/04 0756) Pulse Rate: 73 (08/04 0756)  Labs: Recent Labs    02/07/19 1011 02/07/19 1345 02/08/19 0614  HGB 11.3*  --  10.6*  HCT 37.9  --  36.4  PLT 266  --  216  LABPROT  --  26.6* 28.9*  INR  --  2.5* 2.8*  CREATININE 1.02*  --  0.94  TROPONINIHS 48*  --   --     Estimated Creatinine Clearance: 45.2 mL/min (by C-G formula based on SCr of 0.94 mg/dL).   Medical History: Past Medical History:  Diagnosis Date  . CHF (congestive heart failure) ([place])   . Chronic kidney disease   . COPD (chronic obstructive pulmonary disease) ([place])   . Diabetes mellitus without complication ([place])   . Hypertension     Medications:  Scheduled:  . aspirin  81 mg Oral Daily  . atorvastatin  10 mg Oral q1800  . carvedilol  25 mg Oral BID  . furosemide  40 mg Intravenous BID  . hydrALAZINE  25 mg Oral Q8H  . insulin aspart  0-5 Units Subcutaneous QHS  . insulin aspart  0-9 Units Subcutaneous TID WC  . pantoprazole  40 mg Oral Daily  . sodium chloride flush  3 mL Intravenous Q12H  . warfarin  5 mg Oral q1800  . Warfarin - Pharmacist Dosing Inpatient   Does not apply q1800   Infusions:  . sodium chloride      Assessment: Pharmacy consulted for warfarin management for 85 yo female admitted with CHF and COPD exacerbation. PMH significant for aortic valve disease s/p valve replacement at [facility] in 2010. Patient takes warfarin 5mg  all days except Thursdays when she takes 7.5mg . Patient takes warfarin for atrial fibrillation, goal INR 2-3.   Today's INR is therapeutic. However, INR could be effected by fluid changes/furosemide and cause INR to be elevated. Aspirin may  increase the risk of bleeding. May need to closely monitor INR d/t CHF exacerbation.   Date  INR  Dose (mg)  8/3 2.5 5  8/4 2.8                         Goal of Therapy:  INR 2-3 Monitor platelets by anticoagulation protocol: Yes   Plan:  Continue home dose of warfarin 5mg  daily. Will obtain INRs dailys while admitted.   Pharmacy will continue to monitor and adjust per consult.   [name] 02/08/2019,9:53 AM

## 2019-02-08 NOTE — Progress Notes (Signed)
Family Meeting Note  Advance Directive: No  Today a meeting took place with the patient.   The following clinical team members were present during this meeting:MD  The following were discussed:Patient's diagnosis: CHF, Chronic oxygen use., Patient's progosis: Guarded and Goals for treatment: full scope for treatable illness. In cardiac or respi arrest- will like to have CPR/ Defibrillation use but does not want Intubation/ vent support.  Additional follow-up to be provided: Cardiology. PMD  Time spent during discussion: 20 min.  Vaughan Basta, MD

## 2019-02-08 NOTE — Progress Notes (Signed)
*  PRELIMINARY RESULTS* Echocardiogram 2D Echocardiogram has been performed.  Kimberly Peterson Blades 02/08/2019, 9:30 AM

## 2019-02-08 NOTE — TOC Initial Note (Signed)
Transition of Care [facility]) - Initial/Assessment Note    Patient Details  Name: Kimberly Peterson MRN: [id number] Date of Birth: 1930-02-01  [facility]) CM/SW Contact:    Ross Ludwig, LCSW Phone Number: 02/08/2019, 6:29 PM  Clinical Narrative:                  Patient is an 85 year old female who is alert and oriented x4.  Patient lives with her daughter, she has received home health services in the past from [facility].  CSW spoke to patient and she asked CSW to call her daughter to discuss home health agencies.  Patient's daughter stated they had [facility] in the past, and would like patient to use them again.  CSW contacted [facility] and spoke with [name], she said they can accept patient for home health services once she is medically ready for discharge.  Expected Discharge Plan: [place] Barriers to Discharge: Continued Medical Work up   Patient Goals and CMS Choice Patient states their goals for this hospitalization and ongoing recovery are:: To return back home with her daughter CMS Medicare.gov Compare Post Acute Care list provided to:: Patient Choice offered to / list presented to : Patient, Adult Children  Expected Discharge Plan and Services Expected Discharge Plan: [place] Acute Care Choice: Home Health                   DME Arranged: N/A DME Agency: NA       HH Arranged: RN, PT, Nurse's Aide [place] [facility] [employer] Date [place]: 02/08/19 Time [place]: 52 Representative spoke with at [place]: Drue Novel  Prior Living Arrangements/Services   Lives with:: Adult Children Patient language and need for interpreter reviewed:: Yes Do you feel safe going back to the place where you live?: Yes      Need for Family Participation in Patient Care: Yes (Comment) Care giver support system in place?: Yes (comment)   Criminal  Activity/Legal Involvement Pertinent to Current Situation/Hospitalization: No - Comment as needed  Activities of Daily Living Home Assistive Devices/Equipment: Eyeglasses, Walker (specify type) ADL Screening (condition at time of admission) Patient's cognitive ability adequate to safely complete daily activities?: Yes Is the patient deaf or have difficulty hearing?: No Does the patient have difficulty seeing, even when wearing glasses/contacts?: Yes Does the patient have difficulty concentrating, remembering, or making decisions?: No Patient able to express need for assistance with ADLs?: Yes Does the patient have difficulty dressing or bathing?: Yes Independently performs ADLs?: Yes (appropriate for developmental age) Does the patient have difficulty walking or climbing stairs?: Yes Weakness of Legs: Both Weakness of Arms/Hands: None  Permission Sought/Granted Permission sought to share information with : Family Supports Permission granted to share information with : Yes, Verbal Permission Granted  Share Information with NAME: Kimberly Peterson Daughter   [phone]  Permission granted to share info w AGENCY: [place]        Emotional Assessment Appearance:: Appears stated age   Affect (typically observed): Appropriate Orientation: : Oriented to Self, Oriented to Place, Oriented to  Time, Oriented to Situation Alcohol / Substance Use: Not Applicable Psych Involvement: No (comment)  Admission diagnosis:  Acute on chronic congestive heart failure, unspecified heart failure type [facility]) [I50.9] Patient Active Problem List   Diagnosis Date Noted  . Acute respiratory failure with  hypoxia ([place]) 02/07/2019  . Type 2 diabetes with peripheral circulatory disorder, controlled ([place]) 10/17/2018  . Complete heart block ([place]) 09/09/2018  . Severe aortic valve stenosis 09/09/2018  . Lymphedema 08/12/2018  . Uncontrolled type 2 diabetes mellitus with hyperglycemia ([place]) 02/25/2018   . Atrial fibrillation ([place]) 02/25/2018  . Encounter for therapeutic drug monitoring 02/25/2018  . Dependence on supplemental oxygen 02/25/2018  . Lower extremity edema 02/25/2018  . CHF (congestive heart failure) ([place]) 12/04/2015  . Diabetes mellitus ([place]) 12/04/2015  . Chronic obstructive pulmonary disease ([place]) 09/20/2015  . Diastolic CHF, acute on chronic (HCC) 09/19/2015  . Benign essential HTN 09/19/2015  . Controlled diabetes mellitus without complication, with long-term current use of insulin ([place]) 09/19/2015  . Hyperlipidemia, mixed 09/04/2014  . Pulmonary embolism ([place]) 09/23/2013   PCP:  [name], MD Pharmacy:   [facility] [address] [place] [address] [place] [address] Phone: [phone] Fax: [phone]     Social Determinants of Health (SDOH) Interventions    Readmission Risk Interventions No flowsheet data found.

## 2019-02-08 NOTE — Consult Note (Signed)
[facility] Cardiology  CARDIOLOGY CONSULT NOTE  Patient ID: Kimberly Peterson MRN: [id number] DOB/AGE: 1929/09/27 85 y.o.  Admit date: 02/07/2019 Referring Physician Rufina Falco, NP Primary Physician Dr. Clayborn Bigness Primary Cardiologist Dr. Serafina Royals  Reason for Consultation Acute on chronic congestive heart failure  HPI: Kimberly Peterson is an 85 year old female with a past medical history significant for aortic valve disease s/p valve replacement at [facility] in 2010, atrial fibrillation, on warfarin, complete AV block s/p permanent pacemaker insertion, chronic diastolic congestive heart failure, oxygen dependent COPD, type 2 diabetes, chronic kidney disease, hypertension, and hyperlipidemia who presented to the ED on 02/07/19 for a week long history of increasing shortness of breath, lower extremity swelling, and orthopnea.  Workup in the ED included chest xray significant for pulmonary vascular congestion with small bilateral pleural effusions, BNP of 476, troponin of 48, and COVID-19 negative.  Today, Kimberly Peterson reports significant improvement in shortness of breath and lower extremity swelling.  She denies chest pain or palpitations.   She is followed in outpatient cardiology by Dr. Serafina Royals. Most recent echocardiogram on 08/03/18 revealed preserved LV function with an EF estimated between 55-65% with a sclerotic aortic valve, no evidence of stenosis.   Review of systems complete and found to be negative unless listed above     Past Medical History:  Diagnosis Date  . CHF (congestive heart failure) ([place])   . Chronic kidney disease   . COPD (chronic obstructive pulmonary disease) ([place])   . Diabetes mellitus without complication ([place])   . Hypertension     Past Surgical History:  Procedure Laterality Date  . aoric valve replacemet      st Jude  . CATARACT EXTRACTION, BILATERAL    . PACEMAKER PLACEMENT    . VALVE REPLACEMENT      Medications Prior to Admission  Medication Sig  Dispense Refill Last Dose  . aspirin 81 MG tablet Take 81 mg by mouth daily.   02/06/2019 at Unknown time  . atorvastatin (LIPITOR) 10 MG tablet TAKE 1 TABLET BY MOUTH ONCE DAILY FOR CHOLESTEROL 90 tablet 3 02/06/2019 at Unknown time  . carvedilol (COREG) 25 MG tablet Take 1 tablet by mouth twice daily 180 tablet 0 02/06/2019 at Unknown time  . furosemide (LASIX) 40 MG tablet TAKE 1 TABLET BY MOUTH ONCE DAILY FOR FLUID. MAY TAKE ONE HALF TABLET EXTRA IF NEEDED 120 tablet 0 02/06/2019 at Unknown time  . hydrALAZINE (APRESOLINE) 25 MG tablet TAKE 1 & 1/2 (ONE & ONE-HALF) TABLETS BY MOUTH THREE TIMES DAILY FOR BLOOD PRESSURE 405 tablet 0 02/06/2019 at Unknown time  . insulin aspart protamine- aspart (NOVOLOG MIX 70/30) (70-30) 100 UNIT/ML injection Inject 21-34 Units into the skin 2 (two) times daily with a meal. 34 units in morning and 21 units in the evening   Past Week at Unknown time  . pantoprazole (PROTONIX) 40 MG tablet Take 40 mg by mouth daily.   02/06/2019 at Unknown time  . warfarin (COUMADIN) 5 MG tablet TAKE ONE TABLET BY MOUTH ONCE DAILY AT 5-6PM AND TAKE ONE AND ONE-HALF TABLET BY MOUTH ON THURSDAY 90 tablet 4 02/06/2019 at Unknown time   Social History   Socioeconomic History  . Marital status: Married    Spouse name: Not on file  . Number of children: Not on file  . Years of education: Not on file  . Highest education level: Not on file  Occupational History  . Not on file  Social Needs  .  Financial resource strain: Not on file  . Food insecurity    Worry: Not on file    Inability: Not on file  . Transportation needs    Medical: Not on file    Non-medical: Not on file  Tobacco Use  . Smoking status: Never Smoker  . Smokeless tobacco: Never Used  Substance and Sexual Activity  . Alcohol use: No  . Drug use: No  . Sexual activity: Never  Lifestyle  . Physical activity    Days per week: Not on file    Minutes per session: Not on file  . Stress: Not on file  Relationships  .  Social Herbalist on phone: Not on file    Gets together: Not on file    Attends religious service: Not on file    Active member of club or organization: Not on file    Attends meetings of clubs or organizations: Not on file    Relationship status: Not on file  . Intimate partner violence    Fear of current or ex partner: Not on file    Emotionally abused: Not on file    Physically abused: Not on file    Forced sexual activity: Not on file  Other Topics Concern  . Not on file  Social History Narrative  . Not on file    History reviewed. No pertinent family history.    Review of systems complete and found to be negative unless listed above      PHYSICAL EXAM  General: Well developed, well nourished, in no acute distress HEENT:  Normocephalic and atramatic Neck:  No JVD.  Lungs: on 3 L nasal cannula. Decreased lung sounds in upper and lower lung fields bilaterally. No wheezing.  Heart: HRRR . Normal S1 and S2 without gallops or murmurs.  Abdomen: Bowel sounds are positive, abdomen soft and non-tender  Msk:  Back normal.  Normal strength and tone for age. Extremities: No clubbing or cyanosis. 1+ pitting edema in bilateral lower extremities  Neuro: Alert and oriented X 3. Psych:  Good affect, responds appropriately  Labs:   Lab Results  Component Value Date   WBC 6.1 02/08/2019   HGB 10.6 (L) 02/08/2019   HCT 36.4 02/08/2019   MCV 89.0 02/08/2019   PLT 216 02/08/2019    Recent Labs  Lab 02/08/19 0614  NA 138  K 4.0  CL 98  CO2 33*  BUN 19  CREATININE 0.94  CALCIUM 8.7*  GLUCOSE 178*   Lab Results  Component Value Date   CKTOTAL 196 (H) 08/17/2013   CKMB 2.3 08/17/2013   TROPONINI 0.07 (HH) 08/02/2018    Lab Results  Component Value Date   CHOL 169 09/28/2017   CHOL 160 08/18/2013   Lab Results  Component Value Date   HDL 56 09/28/2017   HDL 68 (H) 08/18/2013   Lab Results  Component Value Date   LDLCALC 87 09/28/2017   LDLCALC 76  08/18/2013   Lab Results  Component Value Date   TRIG 128 09/28/2017   TRIG 80 08/18/2013   No results found for: CHOLHDL No results found for: LDLDIRECT    Radiology: Dg Chest Port 1 View  Result Date: 02/07/2019 CLINICAL DATA:  Shortness of breath. EXAM: PORTABLE CHEST 1 VIEW COMPARISON:  08/02/2018. FINDINGS: Cardiac pacer stable position. Prior median sternotomy. Cardiomegaly with pulmonary venous congestion and bilateral interstitial prominence. Bilateral pleural effusion noted. Similar findings noted on prior exam is suggest CHF. Pneumonitis cannot  be excluded. IMPRESSION: Cardiac pacer stable position. Prior median sternotomy. Cardiomegaly with pulmonary vascular congestion, bilateral interstitial prominence, and small bilateral pleural effusions suggesting CHF. Similar findings noted on prior exam. Electronically Signed   By: [place]   On: 02/07/2019 10:43    EKG: Atrial sensed, ventricular paced rhythm   ASSESSMENT AND PLAN:  1.  Acute on chronic congestive heart failure   -Continue IV Lasix 40mg  BID for now with daily weights, I's and O's  -Echocardiogram pending   -Discussed the importance of a low sodium diet, dietary modifications   -Continue 3L [place] for now; will wean to baseline 2L   -Will need follow up appointment in outpatient setting with Dr. [name] or Hilbert Odor within 1 week of discharge  2.  Atrial fibrillation   -Rate well controlled on carvedilol 25mg  twice daily, will continue with this and follow   -Continue current warfarin dose with INR goal between 2-3 3.  Aortic valve disease s/p replacement in 2010  -Echocardiogram pending  4.  Elevated troponin   -Likely demand ischemia in the setting of acute on chronic diastolic heart failure; patient denies chest pain, no further ischemic workup indicated at this time  5.  Type 2 Diabetes  -Continue sliding scale insulin   The history, physical exam findings, and plan of care were all discussed with  Dr. Bartholome Bill, and all decision making was made in collaboration.   Signed: Avie Arenas PA-C 02/08/2019, 7:43 AM

## 2019-02-08 NOTE — Progress Notes (Signed)
Physical Therapy Treatment Patient Details Name: Kimberly Peterson MRN: [id number] DOB: [birth year] Today's Date: 02/08/2019    History of Present Illness presented to ER secondary to progressive SOB/dyspnea, LE edema x2 weeks; admitted for management of acute/chronic heart failure.    PT Comments    Pt in chair, ready for session.  93% at rest 3 lpm.  Requested to use bathroom and was able to stand and transfer to commode 4' away with walker and min guard.  After voiding, she is able to walk 12' in room with walker and min guard.  Decreased safety awareness and walker position.  Fatigue noted.  Sats decreased to 84% on 3 lpm and increased back to baseline with time.  Declined further activity due to fatigue.  Discussed discharge plan.  Lives with daughter who is a CNA and not currently working.  Stated bed/bath is on 1st floor with no steps to enter her home.  Pt stated she has a walker at home but it is old and she does not use it currently.  Reviewed current abilities and limitations and she is aware of them here but seems to think she will be back at her baseline upon discharge without difficulty.  Pt fatigued quickly here but feels walking to her kitchen and in/out of the home will pose no trouble.  Pt would benefit from a wheelchair for home use for household mobility as she would have difficulty at this time. Pt encouraged to use her walker at home upon discharge as she relies on it here but again thinks she will be fine upon returning home.  Education provided.  Pt stated her daughter can give her the support needed at home.   Patient suffers from CHF which impairs his/her ability to perform daily activities like toileting, feeding, dressing, grooming, bathing in the home. A cane, walker, crutch will not resolve the patient's issue with performing activities of daily living. A lightweight wheelchair is required/recommended and will allow patient to safely perform daily activities.   Patient  can safely propel the wheelchair in the home or has a caregiver who can provide assistance.    Follow Up Recommendations  Home health PT     Equipment Recommendations  Rolling walker with 5" wheels;Wheelchair (measurements PT);Wheelchair cushion (measurements PT);3in1 (PT)    Recommendations for Other Services       Precautions / Restrictions Precautions Precautions: Fall Restrictions Weight Bearing Restrictions: No    Mobility  Bed Mobility Overal bed mobility: Needs Assistance Bed Mobility: Supine to Sit     Supine to sit: Supervision        Transfers Overall transfer level: Needs assistance Equipment used: Rolling walker (2 wheeled) Transfers: Sit to/from Stand Sit to Stand: Min assist            Ambulation/Gait Ambulation/Gait assistance: Min guard;Min assist Gait Distance (Feet): 12 Feet Assistive device: Rolling walker (2 wheeled) Gait Pattern/deviations: Wide base of support;Trunk flexed;Decreased step length - right;Decreased step length - left;Step-through pattern Gait velocity: decreased   General Gait Details: irregular step pattern, short choopy steps reliant on walker with poor placement at times.  Verbal cues for safetyd   Stairs             Wheelchair Mobility    Modified Rankin (Stroke Patients Only)       Balance Overall balance assessment: Needs assistance Sitting-balance support: No upper extremity supported;Feet supported Sitting balance-Leahy Scale: Good     Standing balance support: Bilateral upper extremity supported Standing  balance-Leahy Scale: Fair                              Cognition Arousal/Alertness: Awake/alert Behavior During Therapy: WFL for tasks assessed/performed Overall Cognitive Status: Within Functional Limits for tasks assessed                                        Exercises Other Exercises Other Exercises: to commode to void    General Comments         Pertinent Vitals/Pain Pain Assessment: No/denies pain    Home Living                      Prior Function            PT Goals (current goals can now be found in the care plan section) Progress towards PT goals: Progressing toward goals    Frequency    Min 2X/week      PT Plan      Co-evaluation              AM-PAC PT "6 Clicks" Mobility   Outcome Measure  Help needed turning from your back to your side while in a flat bed without using bedrails?: None Help needed moving from lying on your back to sitting on the side of a flat bed without using bedrails?: None Help needed moving to and from a bed to a chair (including a wheelchair)?: A Little Help needed standing up from a chair using your arms (e.g., wheelchair or bedside chair)?: A Little Help needed to walk in hospital room?: A Little Help needed climbing 3-5 steps with a railing? : A Little 6 Click Score: 20    End of Session Equipment Utilized During Treatment: Gait belt;Oxygen Activity Tolerance: Patient tolerated treatment well;Patient limited by fatigue Patient left: in chair;with call bell/phone within reach;with chair alarm set         Time: 0814-4818 PT Time Calculation (min) (ACUTE ONLY): 16 min  Charges:  $Gait Training: 8-22 mins                    Chesley Noon, PTA 02/08/19, 3:32 PM

## 2019-02-08 NOTE — Progress Notes (Signed)
Cardiovascular and Pulmonary Nurse Navigator Note:    85 year old female with past medical history significant for COPD, DM, CKD, HTN,  aortic valve disease - S/P valve replacement at [facility] in 2010, afib on coumadin, complete AV block - s/p pacemaker insertion, chronic diastolic CHF, HLD who presented to the ED on 02/07/2019 for a week long history of increasing SOB, lower extremity edema, and orthopnea.  Patient admitted with dx of acute on chronic hypoxic respiratory failure - secondary to diastolic CHF exacerbation.   CXR:  Cardiomegaly with pulmonary vascular congestion.  BNP 476 with BMI of 38.2.  Echo performed today.    Procedure: 2D Echo, Color Doppler, Cardiac Doppler and Intracardiac            Opacification Agent  Indications:     G95.62 CHF-Acute Systolic   History:         Patient has prior history of Echocardiogram examinations, most                  recent 08/03/2018. Pacemaker COPD and CKD [employer] AVR Risk                  Factors: Hypertension and Diabetes.   Sonographer:     Charmayne Sheer RDCS (AE) Referring Phys:  [place] Diagnosing Phys: Bartholome Bill MD    Sonographer Comments: Technically difficult study due to poor echo windows. IMPRESSIONS    1. The left ventricle has normal systolic function with an ejection fraction of 60-65%. The cavity size was normal. Left ventricular diastolic Doppler parameters are consistent with impaired relaxation.  2. The right ventricle was not well visualized. The cavity was mildly enlarged. There is no increase in right ventricular wall thickness.  3. Left atrial size was mildly dilated.  4. Right atrial size was mildly dilated.  5. The mitral valve was not well visualized. Mild thickening of the mitral valve leaflet. Mild calcification of the mitral valve leaflet.  6. The tricuspid valve is not well visualized. Tricuspid valve regurgitation was not assessed by color flow Doppler.  7. The aortic valve was  not well visualized. Aortic valve regurgitation is trivial by color flow Doppler.  8. The aorta is normal in size and structure.  9. The interatrial septum was not assessed.  ___________________________________________ HF Education:   Rounded on patient.  Patient sitting in recliner chair sound asleep.    This RN spoke to patient's assigned nurse, Lucina Mellow.  Jessica informed this RN the patient has some problems with her vision and is unable to see the HF videos nor read.  Jessica plans to review "Living Better with Heart Failure" booklet with patient's daughter when she comes to visit.  Booklet and HF magnet to be given / reviewed with the patient's daughter.    Patient's PCP is Dr. Clayborn Bigness. Patient's Cardiologist is Dr. [name] New patient appointment has been scheduled for the [facility] on 02/18/2019 at 10 a.m.    PT has evaluated the patient this admission and has recommended HH PT.  Patient would benefit from [facility] RN with CHF protocol.  Will let TOC SW / CM know.  Patient is currently on Lasix 40 mg IV  Two times daily. Prior to admission patient was on lasix orally one 40 mg tab per day; may take extra 1/2 tablet if needed. Patient's weight today is the same as upon admission to the unit - 94.5 kg.     Patient is  active with [facility] Outpatient Case Management.  Last outreach telephone call was on 12/29/2018. Appears the appointment on 01/26/2019 was cancelled by [facility] CM.     Will continue to follow.    Roanna Epley, RN, BSN, [facility]  Cardiovascular & Pulmonary Nurse Navigator  Direct Line: [phone]  Department Phone #: [phone] Fax: [phone]  Email Address: [email or web address]

## 2019-02-09 LAB — BASIC METABOLIC PANEL
Anion gap: 9 (ref 5–15)
BUN: 21 mg/dL (ref 8–23)
CO2: 34 mmol/L — ABNORMAL HIGH (ref 22–32)
Calcium: 8.8 mg/dL — ABNORMAL LOW (ref 8.9–10.3)
Chloride: 97 mmol/L — ABNORMAL LOW (ref 98–111)
Creatinine, Ser: 1.13 mg/dL — ABNORMAL HIGH (ref 0.44–1.00)
GFR calc Af Amer: 51 mL/min — ABNORMAL LOW (ref >60)
GFR calc non Af Amer: 44 mL/min — ABNORMAL LOW (ref >60)
Glucose, Bld: 205 mg/dL — ABNORMAL HIGH (ref 70–99)
Potassium: 4.3 mmol/L (ref 3.5–5.1)
Sodium: 140 mmol/L (ref 135–145)

## 2019-02-09 LAB — GLUCOSE, CAPILLARY
Glucose-Capillary: 190 mg/dL — ABNORMAL HIGH (ref 70–99)
Glucose-Capillary: 267 mg/dL — ABNORMAL HIGH (ref 70–99)

## 2019-02-09 LAB — PROTIME-INR
INR: 2.9 — ABNORMAL HIGH (ref 0.8–1.2)
Prothrombin Time: 30.1 seconds — ABNORMAL HIGH (ref 11.4–15.2)

## 2019-02-09 MED ORDER — WARFARIN SODIUM 4 MG PO TABS
4.5000 mg | ORAL_TABLET | Freq: Once | ORAL | Status: DC
  Filled 2019-02-09: qty 1

## 2019-02-09 NOTE — Discharge Summary (Signed)
[place] at [place] NAME: Kimberly Peterson    MR#:  [id number]  DATE OF BIRTH:  Nov 11, 1929  DATE OF ADMISSION:  02/07/2019 ADMITTING PHYSICIAN: Lang Snow, NP  DATE OF DISCHARGE: 02/09/2019   PRIMARY CARE PHYSICIAN: [name], MD    ADMISSION DIAGNOSIS:  Acute on chronic congestive heart failure, unspecified heart failure type ([place]) [I50.9]  DISCHARGE DIAGNOSIS:  Active Problems:   Acute respiratory failure with hypoxia ([place])   SECONDARY DIAGNOSIS:   Past Medical History:  Diagnosis Date  . CHF (congestive heart failure) ([place])   . Chronic kidney disease   . COPD (chronic obstructive pulmonary disease) ([place])   . Diabetes mellitus without complication ([place])   . Hypertension     HOSPITAL COURSE:   85 y.o.femalemedical history significant for congestive heart failure, aortic valve disease status post valve replacement at [facility], complete AV block status post permanent pacemaker insertion, oxygen dependent COPD, diabetes mellitus, PE, atrial fibrillation on Coumadin, hypertension and CKD presenting to the ED with worsening shortness of breath and lower extremity swelling.  1.Acute on chronic hypoxic respiratory failure -secondary to CHF exacerbation - Supplemental O2, goal sat 88-92% - she is on Home O2.  2.Acute on chronicDiastolicCongestive Heart Failure: Acute presentation likely due to volume overload with associated symptoms of SOB, BLE edema and. BNPelevated IO973 - Chest x-ray shows pulmonary vascular congestion Last Echo1/28/2020, EF 55-65% - Beta-Blockade:Coreg - Diuretics: Furosemide40mg  IVBID. Diureses >1L negative per day until approach euvolemia / worsening renal function.She had > 4 ltr fluid diuresed. - Low salt diet - Check daily weight - Strict I&Os - CHF Teaching  3.COPD-no evidence of exacerbation -Supplemental oxygen - Bronchodilators (albuterol/ipratropium)  standing and PRN  4. Elevated troponin - Likely due to demand ischemia  5.ParoxysmalAtrial fibrillation-on Coumadin -Check PT/INR  6.DM: sugars have been well-controlled on current regimen -Low intensity sliding scale insulin coverage -FS BS qac and qhs  7.DVT prophylaxis - Therapeutically anti-coagulated with warfarin  DISCHARGE CONDITIONS:   Stable.  CONSULTS OBTAINED:  Treatment Team:  Teodoro Spray, MD  DRUG ALLERGIES:  No Known Allergies  DISCHARGE MEDICATIONS:   Allergies as of 02/09/2019   No Known Allergies     Medication List    TAKE these medications   aspirin 81 MG tablet Take 81 mg by mouth daily.   atorvastatin 10 MG tablet Commonly known as: LIPITOR TAKE 1 TABLET BY MOUTH ONCE DAILY FOR CHOLESTEROL   carvedilol 25 MG tablet Commonly known as: COREG Take 1 tablet by mouth twice daily   furosemide 40 MG tablet Commonly known as: LASIX TAKE 1 TABLET BY MOUTH ONCE DAILY FOR FLUID. MAY TAKE ONE HALF TABLET EXTRA IF NEEDED   hydrALAZINE 25 MG tablet Commonly known as: APRESOLINE TAKE 1 & 1/2 (ONE & ONE-HALF) TABLETS BY MOUTH THREE TIMES DAILY FOR BLOOD PRESSURE   insulin aspart protamine- aspart (70-30) 100 UNIT/ML injection Commonly known as: NOVOLOG MIX 70/30 Inject 21-34 Units into the skin 2 (two) times daily with a meal. 34 units in morning and 21 units in the evening   pantoprazole 40 MG tablet Commonly known as: PROTONIX Take 40 mg by mouth daily.   warfarin 5 MG tablet Commonly known as: COUMADIN TAKE ONE TABLET BY MOUTH ONCE DAILY AT 5-6PM AND TAKE ONE AND ONE-HALF TABLET BY MOUTH ON THURSDAY        DISCHARGE INSTRUCTIONS:    Follow with PMD in 1-2 weeks. Follow with CHF  clinic.  If you experience worsening of your admission symptoms, develop shortness of breath, life threatening emergency, suicidal or homicidal thoughts you must seek medical attention immediately by calling 911 or calling your MD immediately  if  symptoms less severe.  You Must read complete instructions/literature along with all the possible adverse reactions/side effects for all the Medicines you take and that have been prescribed to you. Take any new Medicines after you have completely understood and accept all the possible adverse reactions/side effects.   Please note  You were cared for by a hospitalist during your hospital stay. If you have any questions about your discharge medications or the care you received while you were in the hospital after you are discharged, you can call the unit and asked to speak with the hospitalist on call if the hospitalist that took care of you is not available. Once you are discharged, your primary care physician will handle any further medical issues. Please note that NO REFILLS for any discharge medications will be authorized once you are discharged, as it is imperative that you return to your primary care physician (or establish a relationship with a primary care physician if you do not have one) for your aftercare needs so that they can reassess your need for medications and monitor your lab values.    Today   CHIEF COMPLAINT:   Chief Complaint  Patient presents with  . Shortness of Breath    HISTORY OF PRESENT ILLNESS:  Kimberly Peterson  is a 85 y.o. female with a known history of congestive heart failure, aortic valve disease status post valve replacement at [facility], complete AV block status post permanent pacemaker insertion, oxygen dependent COPD, diabetes mellitus, PE, atrial fibrillation on Coumadin, hypertension and CKD presenting to the ED with worsening shortness of breath and lower extremity swelling x 2 weeks.  On arrival to the ED, he was afebrile with blood pressure 191/85 mm Hg and pulse rate 94 beats/min. There were no focal neurological deficits; she was alert and oriented x4, and he did not demonstrate any memory deficits.  She was notably short of breath with increased work  of breathing.  Denies any dizziness, chest pain, cough or diaphoresis.  Initial labs revealed unremarkable CBC, creatinine 1.02, troponin 48, BNP 476.  Chest x-ray showed pulmonary vascular congestion.  Patient received IV Lasix 80 mg in the ER she is being admitted for further evaluation and management.   VITAL SIGNS:  Blood pressure (!) 124/56, pulse 76, temperature 98 F (36.7 C), temperature source Oral, resp. rate 16, height 5\' 2"  (1.575 m), weight 93.8 kg, SpO2 93 %.  I/O:    Intake/Output Summary (Last 24 hours) at 02/09/2019 1353 Last data filed at 02/09/2019 1100 Gross per 24 hour  Intake 360 ml  Output 1450 ml  Net -1090 ml    PHYSICAL EXAMINATION:   GENERAL:  85 y.o.-year-old patient lying in the bed with no acute distress.  EYES: Pupils equal, round, reactive to light and accommodation. No scleral icterus. Extraocular muscles intact.  HEENT: Head atraumatic, normocephalic. Oropharynx and nasopharynx clear.  NECK:  Supple, no jugular venous distention. No thyroid enlargement, no tenderness.  LUNGS: Normal breath sounds bilaterally, no wheezing, have crepitation. No use of accessory muscles of respiration.  CARDIOVASCULAR: S1, S2 normal. No murmurs, rubs, or gallops.  ABDOMEN: Soft, nontender, nondistended. Bowel sounds present. No organomegaly or mass.  EXTREMITIES: Have some pedal edema, no cyanosis, or clubbing.  NEUROLOGIC: Cranial nerves II through XII are  intact. Muscle strength 5/5 in all extremities. Sensation intact. Gait not checked.  PSYCHIATRIC: The patient is alert and oriented x 3.  SKIN: No obvious rash, lesion, or ulcer.   DATA REVIEW:   CBC Recent Labs  Lab 02/08/19 0614  WBC 6.1  HGB 10.6*  HCT 36.4  PLT 216    Chemistries  Recent Labs  Lab 02/09/19 0549  NA 140  K 4.3  CL 97*  CO2 34*  GLUCOSE 205*  BUN 21  CREATININE 1.13*  CALCIUM 8.8*    Cardiac Enzymes No results for input(s): TROPONINI in the last 168 hours.  Microbiology  Results  Results for orders placed or performed during the hospital encounter of 02/07/19  SARS Coronavirus 2 [facility] order, Performed in [facility] hospital lab)     Status: None   Collection Time: 02/07/19 10:41 AM  Result Value Ref Range Status   SARS Coronavirus 2 NEGATIVE NEGATIVE Final    Comment: (NOTE) If result is NEGATIVE SARS-CoV-2 target nucleic acids are NOT DETECTED. The SARS-CoV-2 RNA is generally detectable in upper and lower  respiratory specimens during the acute phase of infection. The lowest  concentration of SARS-CoV-2 viral copies this assay can detect is 250  copies / mL. A negative result does not preclude SARS-CoV-2 infection  and should not be used as the sole basis for treatment or other  patient management decisions.  A negative result may occur with  improper specimen collection / handling, submission of specimen other  than nasopharyngeal swab, presence of viral mutation(s) within the  areas targeted by this assay, and inadequate number of viral copies  (<250 copies / mL). A negative result must be combined with clinical  observations, patient history, and epidemiological information. If result is POSITIVE SARS-CoV-2 target nucleic acids are DETECTED. The SARS-CoV-2 RNA is generally detectable in upper and lower  respiratory specimens dur ing the acute phase of infection.  Positive  results are indicative of active infection with SARS-CoV-2.  Clinical  correlation with patient history and other diagnostic information is  necessary to determine patient infection status.  Positive results do  not rule out bacterial infection or co-infection with other viruses. If result is PRESUMPTIVE POSTIVE SARS-CoV-2 nucleic acids MAY BE PRESENT.   A presumptive positive result was obtained on the submitted specimen  and confirmed on repeat testing.  While 2019 novel coronavirus  (SARS-CoV-2) nucleic acids may be present in the submitted sample  additional  confirmatory testing may be necessary for epidemiological  and / or clinical management purposes  to differentiate between  SARS-CoV-2 and other Sarbecovirus currently known to infect humans.  If clinically indicated additional testing with an alternate test  methodology [phone]) is advised. The SARS-CoV-2 RNA is generally  detectable in upper and lower respiratory sp ecimens during the acute  phase of infection. The expected result is Negative. Fact Sheet for Patients:  StrictlyIdeas.no Fact Sheet for Healthcare Providers: BankingDealers.co.za This test is not yet approved or cleared by the [place] FDA and has been authorized for detection and/or diagnosis of SARS-CoV-2 by FDA under an Emergency Use Authorization (EUA).  This EUA will remain in effect (meaning this test can be used) for the duration of the COVID-19 declaration under Section 564(b)(1) of the Act, 21 U.S.C. section 360bbb-3(b)(1), unless the authorization is terminated or revoked sooner. Performed at [facility], [address] [place], [place] [address]     RADIOLOGY:  No results found.  EKG:   Orders placed or  performed during the hospital encounter of 02/07/19  . EKG 12-Lead  . EKG 12-Lead  . ED EKG  . ED EKG      Management plans discussed with the patient, family and they are in agreement.  CODE STATUS:     Code Status Orders  (From admission, onward)         Start     Ordered   02/07/19 1125  Full code  Continuous     02/07/19 1126        Code Status History    Date Active Date Inactive Code Status Order ID Comments User Context   08/02/2018 1745 08/05/2018 1704 Full Code [id number]  Fritzi Mandes, MD Inpatient   12/04/2015 0346 12/06/2015 1852 Full Code [id number]  Quintella Baton, MD Inpatient   09/19/2015 1427 09/21/2015 2113 Full Code [id number]  Robbie Lis, MD Inpatient   09/23/2013 1544 09/29/2013 1741 Full Code [id number]   Rush Farmer, MD ED   Advance Care Planning Activity      TOTAL TIME TAKING CARE OF THIS PATIENT: 35 minutes.  We will arrange for Home health and spoke to her daughter on phone.  Vaughan Basta M.D on 02/09/2019 at 1:53 PM  Between 7am to 6pm - Pager - [phone]  After 6pm go to [email or web address] - password [email or web address] [facility]  Office  [phone]  CC: Primary care physician; [name], MD   Note: This dictation was prepared with Dragon dictation along with smaller phrase technology. Any transcriptional errors that result from this process are unintentional.  [place] at [facility]

## 2019-02-09 NOTE — TOC Transition Note (Signed)
Transition of Care [facility]) - CM/SW Discharge Note   Patient Details  Name: Kimberly Peterson MRN: [id number] Date of Birth: Dec 16, 1929  Transition of Care [facility]) CM/SW Contact:  Ross Ludwig, LCSW Phone Number: 02/09/2019, 4:33 PM   Clinical Narrative:     Patient will be discharging with home health through [facility], and she will also be having the heart failure protocol.  Patient and daughter did not express any other needs or concerns.  CSW asked if she needed any equipment and patient's daughter said no they have what she needs at home.  Final next level of care: [place] Barriers to Discharge: Continued Medical Work up   Patient Goals and CMS Choice Patient states their goals for this hospitalization and ongoing recovery are:: To return back home with her daughter CMS Medicare.gov Compare Post Acute Care list provided to:: Patient Choice offered to / list presented to : Patient, Adult Children  Discharge Placement  Patient going home with home health.                     Discharge Plan and Services     Post Acute Care Choice: Home Health          DME Arranged: N/A DME Agency: NA       HH Arranged: RN, PT, Nurse's Aide [place] Agency: [facility] [employer] [facility] [employer] Date [place]: 02/08/19 Time [place]: 1615 Representative spoke with at [place]: [place] ([place]) Interventions     Readmission Risk Interventions No flowsheet data found.

## 2019-02-09 NOTE — TOC Progression Note (Signed)
Transition of Care [facility]) - Progression Note    Patient Details  Name: Kimberly Peterson MRN: [id number] Date of Birth: [birth year]  Transition of Care [facility]) CM/SW Contact  Ross Ludwig, [place] Phone Number: 02/09/2019, 3:14 PM  Clinical Narrative:     Patient will be discharging home with home health through [place].  Patient will be having heart failure protocol in place for patient.  Patient did not express any other concerns or issues.   Expected Discharge Plan: [place] Barriers to Discharge: Continued Medical Work up  Expected Discharge Plan and Services Expected Discharge Plan: [place] Choice: Home Health   Expected Discharge Date: 02/09/19               DME Arranged: N/A DME Agency: NA       HH Arranged: RN, PT, Nurse's Aide [place] Agency: [facility] [employer] Date [place]: 02/08/19 Time [place]: 1615 Representative spoke with at [place]: [place] ([place]) Interventions    Readmission Risk Interventions No flowsheet data found.

## 2019-02-09 NOTE — Progress Notes (Signed)
ANTICOAGULATION CONSULT NOTE  Pharmacy Consult for Warfarin Dosing  Indication: Atrial Fibrillation   No Known Allergies  Patient Measurements: Height: 5\' 2"  (157.5 cm) Weight: 206 lb 14.4 oz (93.8 kg) IBW/kg (Calculated) : 50.1  Vital Signs: Temp: 98.7 F (37.1 C) (08/05 0333) Temp Source: Oral (08/05 0333) BP: 132/62 (08/05 0333) Pulse Rate: 73 (08/05 0333)  Labs: Recent Labs    02/07/19 1011 02/07/19 1345 02/08/19 0614 02/09/19 0549  HGB 11.3*  --  10.6*  --   HCT 37.9  --  36.4  --   PLT 266  --  216  --   LABPROT  --  26.6* 28.9* 30.1*  INR  --  2.5* 2.8* 2.9*  CREATININE 1.02*  --  0.94 1.13*  TROPONINIHS 48*  --   --   --     Estimated Creatinine Clearance: 37.4 mL/min (A) (by C-G formula based on SCr of 1.13 mg/dL (H)).   Medical History: Past Medical History:  Diagnosis Date  . CHF (congestive heart failure) ([place])   . Chronic kidney disease   . COPD (chronic obstructive pulmonary disease) ([place])   . Diabetes mellitus without complication ([place])   . Hypertension     Medications:  Scheduled:  . aspirin  81 mg Oral Daily  . atorvastatin  10 mg Oral q1800  . carvedilol  25 mg Oral BID  . furosemide  40 mg Intravenous BID  . hydrALAZINE  25 mg Oral Q8H  . insulin aspart  0-5 Units Subcutaneous QHS  . insulin aspart  0-9 Units Subcutaneous TID WC  . pantoprazole  40 mg Oral Daily  . sodium chloride flush  3 mL Intravenous Q12H  . Warfarin - Pharmacist Dosing Inpatient   Does not apply q1800   Infusions:  . sodium chloride      Assessment: Pharmacy consulted for warfarin management for 85 yo female admitted with CHF and COPD exacerbation. PMH significant for aortic valve disease s/p valve replacement at [facility] in 2010. Patient takes warfarin 5mg  all days except Thursdays when she takes 7.5mg . Patient takes warfarin for atrial fibrillation, goal INR 2-3.   Today's INR is therapeutic yet trending up- likely due to fluid changes. May need to adjust  today's dose to avoid INR from being supratherapeutic. In general, INR could be affected by fluid changes/furosemide and cause INR to be elevated. Aspirin may increase the risk of bleeding. May need to closely monitor INR d/t CHF exacerbation.   Date  INR  Dose (mg)  8/3 2.5 5  8/4 2.8 5  8/5 2.9                     Goal of Therapy:  INR 2-3 Monitor platelets by anticoagulation protocol: Yes   Plan:  Will order warfarin 4.5 mg daily (10% reduction of patient's usual dose for today). Will obtain INRs dailys while admitted.   Pharmacy will continue to monitor and adjust per consult.   [name] 02/09/2019,6:48 AM

## 2019-02-09 NOTE — Progress Notes (Signed)
Discharge instructions explained to pt and  pts daughter , Theresa/ verbalized an understanding/ also discussed CHF education with Theresa/ iv and tele removed/ transported off unit via wheelchair.

## 2019-02-10 ENCOUNTER — Encounter: Payer: Self-pay | Admitting: Nurse Practitioner

## 2019-02-10 ENCOUNTER — Encounter: Payer: Self-pay | Admitting: Internal Medicine

## 2019-02-10 LAB — PROTIME-INR
INR: 1.9 — AB (ref 0.9–1.1)
INR: 2.2 — AB (ref 0.9–1.1)
INR: 2.3 — AB (ref 0.9–1.1)
INR: 2.7 — AB (ref 0.9–1.1)

## 2019-02-11 ENCOUNTER — Other Ambulatory Visit: Payer: Self-pay

## 2019-02-11 DIAGNOSIS — N189 Chronic kidney disease, unspecified: Secondary | ICD-10-CM | POA: Diagnosis not present

## 2019-02-11 DIAGNOSIS — Z9981 Dependence on supplemental oxygen: Secondary | ICD-10-CM | POA: Diagnosis not present

## 2019-02-11 DIAGNOSIS — E669 Obesity, unspecified: Secondary | ICD-10-CM | POA: Diagnosis not present

## 2019-02-11 DIAGNOSIS — I509 Heart failure, unspecified: Secondary | ICD-10-CM | POA: Diagnosis not present

## 2019-02-11 DIAGNOSIS — I480 Paroxysmal atrial fibrillation: Secondary | ICD-10-CM | POA: Diagnosis not present

## 2019-02-11 DIAGNOSIS — I5033 Acute on chronic diastolic (congestive) heart failure: Secondary | ICD-10-CM | POA: Diagnosis not present

## 2019-02-11 DIAGNOSIS — G4730 Sleep apnea, unspecified: Secondary | ICD-10-CM | POA: Diagnosis not present

## 2019-02-11 DIAGNOSIS — E1122 Type 2 diabetes mellitus with diabetic chronic kidney disease: Secondary | ICD-10-CM | POA: Diagnosis not present

## 2019-02-11 DIAGNOSIS — Z794 Long term (current) use of insulin: Secondary | ICD-10-CM | POA: Diagnosis not present

## 2019-02-11 DIAGNOSIS — Z7901 Long term (current) use of anticoagulants: Secondary | ICD-10-CM | POA: Diagnosis not present

## 2019-02-11 DIAGNOSIS — Z950 Presence of cardiac pacemaker: Secondary | ICD-10-CM | POA: Diagnosis not present

## 2019-02-11 DIAGNOSIS — I130 Hypertensive heart and chronic kidney disease with heart failure and stage 1 through stage 4 chronic kidney disease, or unspecified chronic kidney disease: Secondary | ICD-10-CM | POA: Diagnosis not present

## 2019-02-11 DIAGNOSIS — Z6836 Body mass index (BMI) 36.0-36.9, adult: Secondary | ICD-10-CM | POA: Diagnosis not present

## 2019-02-11 DIAGNOSIS — J449 Chronic obstructive pulmonary disease, unspecified: Secondary | ICD-10-CM | POA: Diagnosis not present

## 2019-02-11 DIAGNOSIS — Z952 Presence of prosthetic heart valve: Secondary | ICD-10-CM | POA: Diagnosis not present

## 2019-02-11 DIAGNOSIS — J9621 Acute and chronic respiratory failure with hypoxia: Secondary | ICD-10-CM | POA: Diagnosis not present

## 2019-02-11 DIAGNOSIS — I2699 Other pulmonary embolism without acute cor pulmonale: Secondary | ICD-10-CM | POA: Diagnosis not present

## 2019-02-14 ENCOUNTER — Encounter: Payer: Self-pay | Admitting: *Deleted

## 2019-02-14 ENCOUNTER — Ambulatory Visit (INDEPENDENT_AMBULATORY_CARE_PROVIDER_SITE_OTHER): Payer: Medicare Other

## 2019-02-14 ENCOUNTER — Other Ambulatory Visit: Payer: Self-pay | Admitting: *Deleted

## 2019-02-14 ENCOUNTER — Other Ambulatory Visit: Payer: Self-pay

## 2019-02-14 DIAGNOSIS — J449 Chronic obstructive pulmonary disease, unspecified: Secondary | ICD-10-CM | POA: Diagnosis not present

## 2019-02-14 DIAGNOSIS — I4891 Unspecified atrial fibrillation: Secondary | ICD-10-CM

## 2019-02-14 DIAGNOSIS — I130 Hypertensive heart and chronic kidney disease with heart failure and stage 1 through stage 4 chronic kidney disease, or unspecified chronic kidney disease: Secondary | ICD-10-CM | POA: Diagnosis not present

## 2019-02-14 DIAGNOSIS — J9621 Acute and chronic respiratory failure with hypoxia: Secondary | ICD-10-CM | POA: Diagnosis not present

## 2019-02-14 DIAGNOSIS — N189 Chronic kidney disease, unspecified: Secondary | ICD-10-CM | POA: Diagnosis not present

## 2019-02-14 DIAGNOSIS — I5033 Acute on chronic diastolic (congestive) heart failure: Secondary | ICD-10-CM | POA: Diagnosis not present

## 2019-02-14 DIAGNOSIS — E1122 Type 2 diabetes mellitus with diabetic chronic kidney disease: Secondary | ICD-10-CM | POA: Diagnosis not present

## 2019-02-14 NOTE — Patient Outreach (Signed)
[facility]) Care Management  02/14/2019  Kimberly Peterson February 02, 1930 [id number]  Discipline closure: Health Coach will no loner be involved with the referenced patient Patient was admitted to hospital. Patient will be followed by Complex Care Coordinator.  [place] Care Management [phone]

## 2019-02-14 NOTE — Patient Outreach (Signed)
[facility]) Care Management  02/14/2019  Kimberly Peterson 09/13/1929 [id number]   Referral received from hospital liaison as member was recently discharged from hospital after complications of heart failure.  Primary MD office will complete transition of care assessment.  Per chart, she also has history of HTN, A-fib, controlled diabetes (A1C 5.6), and hyperlipidemia.    Call placed to member, identity verified.  This care manager introduced self and stated purpose of call.  [facility] care management services explained, she is familiar as she was active with this care manager earlier this year and with health coach prior to hospitalization.  She still lives with her daughter who provide support through preparing meals, assistance with ADL's, medication management, and daily monitoring of blood pressure, blood sugar, and weights.  State she was still weighing herself daily, didn't notice much of an increase prior to being admitted.  State she did notice fluid increasing in abdominal area.  Denies any shortness of breath or chest discomfort at this time.  Weight was stable today, state in the 190's, which is not an increase.  Report blood sugars are also "good" in the 100s.  She has home health, will have PT visit today.  Has telephone visit with primary MD tomorrow, also advised of appointment with heart failure clinic on Friday.  Denies any urgent concerns at this time, will follow up within the next 2 weeks.   [facility] CM Care Plan Problem One     Most Recent Value  Care Plan Problem One  Risk for readmission related to heart failure management as evidenced by recent hospitalization  Role Documenting the Problem One  Care Management Maish Vaya for Problem One  Active  [facility] Long Term Goal   Member will not be readmitted to hospital within the next 31 days  [facility] Long Term Goal Start Date  02/14/19  Interventions for Problem One Long Term Goal  Discharge instructions reviewed with  member, advised of importance of following plan of care (diet, MD visits, home health involvement) in effort to decrease readmission risk  [facility] CM Short Term Goal #1   Member and daughter will verbalize understanding of yellow zone on heart failure action plan within the next 4 weeks  [facility] CM Short Term Goal #1 Start Date  02/14/19  Interventions for Short Term Goal #1  Confirmed member has copy of heart failure zones, re-educated on when to contact MD  [facility] CM Short Term Goal #2   Member will keep and attend follow up appointments with primary MD and heart failure clinic within the next 2 weeks   [facility] CM Short Term Goal #2 Start Date  02/14/19  Interventions for Short Term Goal #2  Reviewed upcoming appointments with member.  Advised of importance of following up with heart failure specialist in effort to decrease risk of readmission     Valente David, RN, MSN [place] [phone]

## 2019-02-14 NOTE — Progress Notes (Signed)
Home Inr 2.6 Ok per [employer]

## 2019-02-15 ENCOUNTER — Encounter: Payer: Self-pay | Admitting: Nurse Practitioner

## 2019-02-15 ENCOUNTER — Ambulatory Visit: Payer: Medicare Other | Admitting: Nurse Practitioner

## 2019-02-15 VITALS — BP 156/69 | HR 76 | Ht 62.0 in | Wt 203.4 lb

## 2019-02-15 DIAGNOSIS — I10 Essential (primary) hypertension: Secondary | ICD-10-CM

## 2019-02-15 DIAGNOSIS — E1151 Type 2 diabetes mellitus with diabetic peripheral angiopathy without gangrene: Secondary | ICD-10-CM | POA: Diagnosis not present

## 2019-02-15 DIAGNOSIS — I130 Hypertensive heart and chronic kidney disease with heart failure and stage 1 through stage 4 chronic kidney disease, or unspecified chronic kidney disease: Secondary | ICD-10-CM | POA: Diagnosis not present

## 2019-02-15 DIAGNOSIS — N189 Chronic kidney disease, unspecified: Secondary | ICD-10-CM | POA: Diagnosis not present

## 2019-02-15 DIAGNOSIS — I5021 Acute systolic (congestive) heart failure: Secondary | ICD-10-CM

## 2019-02-15 DIAGNOSIS — I5033 Acute on chronic diastolic (congestive) heart failure: Secondary | ICD-10-CM | POA: Diagnosis not present

## 2019-02-15 DIAGNOSIS — E1122 Type 2 diabetes mellitus with diabetic chronic kidney disease: Secondary | ICD-10-CM | POA: Diagnosis not present

## 2019-02-15 DIAGNOSIS — J9621 Acute and chronic respiratory failure with hypoxia: Secondary | ICD-10-CM | POA: Diagnosis not present

## 2019-02-15 DIAGNOSIS — J449 Chronic obstructive pulmonary disease, unspecified: Secondary | ICD-10-CM | POA: Diagnosis not present

## 2019-02-15 DIAGNOSIS — Z9981 Dependence on supplemental oxygen: Secondary | ICD-10-CM

## 2019-02-15 NOTE — Progress Notes (Signed)
[facility] [place], [place] [address]  Internal MEDICINE  Telephone Visit  Patient Name: Kimberly Peterson  [id number]  [id number]  Date of Service: 02/15/2019  I connected with the patient at 1:53pm by telephone and verified the patients identity using two identifiers.   I discussed the limitations, risks, security and privacy concerns of performing an evaluation and management service by telephone and the availability of in person appointments. I also discussed with the patient that there may be a patient responsible charge related to the service.  The patient expressed understanding and agrees to proceed.    Chief Complaint  Patient presents with  . Telephone Assessment  . Telephone Screen  . Diabetes    93 blood sugars, 200 yesterday morning   . weight problem    199 lbs yesterday and 203lbs today  . Congestive Heart Failure  . Hypertension    .The patient has been contacted via telephone for follow up visit due to concerns for spread of novel coronavirus. The patient was recently hospitalized for episode of congestive heart failure. She was hospitalized from last Monday through Wednesday. Had to pull fluid off of her. No medication changes were made. She has already had follow up with cardiology. She is scheduled for follow up visit with pulmonary on 02/17/2019. She states that she is breathing much better since she was discharged. Records and labs were reviewed. Her HgbA1c was 5.3. she has no concerns or complaints today.       Current Medication: Outpatient Encounter Medications as of 02/15/2019  Medication Sig  . aspirin 81 MG tablet Take 81 mg by mouth daily.  Marland Kitchen atorvastatin (LIPITOR) 10 MG tablet TAKE 1 TABLET BY MOUTH ONCE DAILY FOR CHOLESTEROL  . carvedilol (COREG) 25 MG tablet Take 1 tablet by mouth twice daily  . furosemide (LASIX) 40 MG tablet TAKE 1 TABLET BY MOUTH ONCE DAILY FOR FLUID. MAY TAKE ONE HALF TABLET EXTRA IF NEEDED  . hydrALAZINE  (APRESOLINE) 25 MG tablet TAKE 1 & 1/2 (ONE & ONE-HALF) TABLETS BY MOUTH THREE TIMES DAILY FOR BLOOD PRESSURE  . insulin aspart protamine- aspart (NOVOLOG MIX 70/30) (70-30) 100 UNIT/ML injection Inject 21-34 Units into the skin 2 (two) times daily with a meal. 34 units in morning and 21 units in the evening  . pantoprazole (PROTONIX) 40 MG tablet Take 40 mg by mouth daily.  Marland Kitchen warfarin (COUMADIN) 5 MG tablet TAKE ONE TABLET BY MOUTH ONCE DAILY AT 5-6PM AND TAKE ONE AND ONE-HALF TABLET BY MOUTH ON THURSDAY   No facility-administered encounter medications on file as of 02/15/2019.     Surgical History: Past Surgical History:  Procedure Laterality Date  . aoric valve replacemet      [employer]  . CATARACT EXTRACTION, BILATERAL    . PACEMAKER PLACEMENT    . VALVE REPLACEMENT      Medical History: Past Medical History:  Diagnosis Date  . CHF (congestive heart failure) ([place])   . Chronic kidney disease   . COPD (chronic obstructive pulmonary disease) ([place])   . Diabetes mellitus without complication ([place])   . Hypertension     Family History: History reviewed. No pertinent family history.  Social History   Socioeconomic History  . Marital status: Married    Spouse name: Not on file  . Number of children: Not on file  . Years of education: Not on file  . Highest education level: Not on file  Occupational History  . Not on file  Social Needs  . Financial resource strain: Not hard at all  . Food insecurity    Worry: Never true    Inability: Never true  . Transportation needs    Medical: No    Non-medical: No  Tobacco Use  . Smoking status: Never Smoker  . Smokeless tobacco: Never Used  Substance and Sexual Activity  . Alcohol use: No  . Drug use: No  . Sexual activity: Never  Lifestyle  . Physical activity    Days per week: Not on file    Minutes per session: Not on file  . Stress: Not on file  Relationships  . Social Herbalist on phone: Not on file    Gets  together: Not on file    Attends religious service: Not on file    Active member of club or organization: Not on file    Attends meetings of clubs or organizations: Not on file    Relationship status: Not on file  . Intimate partner violence    Fear of current or ex partner: Not on file    Emotionally abused: Not on file    Physically abused: Not on file    Forced sexual activity: Not on file  Other Topics Concern  . Not on file  Social History Narrative  . Not on file      Review of Systems  Constitutional: Negative for chills, fatigue and unexpected weight change.  HENT: Positive for postnasal drip. Negative for congestion, rhinorrhea, sneezing and sore throat.        States that she has had a runny nose since she was in the hospital.   Eyes: Negative for redness.  Respiratory: Positive for shortness of breath. Negative for cough and chest tightness.   Cardiovascular: Negative for chest pain and palpitations.       Recently hospitalized for congestive heart failure.   Gastrointestinal: Negative for abdominal pain, constipation, diarrhea, nausea and vomiting.  Endocrine:       Blood sugars doing well   Musculoskeletal: Negative for arthralgias, back pain, joint swelling and neck pain.  Skin: Negative for rash.  Allergic/Immunologic: Positive for environmental allergies.  Neurological: Negative for tremors and numbness.  Hematological: Negative for adenopathy. Does not bruise/bleed easily.  Psychiatric/Behavioral: Negative for behavioral problems (Depression), sleep disturbance and suicidal ideas. The patient is not nervous/anxious.     Today's Vitals   02/15/19 1348  BP: (!) 156/69  Pulse: 76  Weight: 203 lb 6.4 oz (92.3 kg)  Height: 5\' 2"  (1.575 m)   Body mass index is 37.2 kg/m.  Observation/Objective:   The patient is alert and oriented. She is pleasant and answers all questions appropriately. Breathing is non-labored. She is in no acute distress at this time.     Assessment/Plan:   1. Acute systolic congestive heart failure ([place]) Recent hospitalization for congestive heart failure. Doing better now. She has been referred to heart failure clinic.   2. Benign essential HTN Generally stable. Continue bp medication as prescribed   3. Type 2 diabetes with peripheral circulatory disorder, controlled ([place]) Continue diabetic medication as prescribed   4. Dependence on supplemental oxygen Continue to use nasal cannula oxygen at all time. Follow up with pulmonary as scheduled.   General Counseling: Darleth verbalizes understanding of the findings of today's phone visit and agrees with plan of treatment. I have discussed any further diagnostic evaluation that may be needed or ordered today. We also reviewed her medications today. she has been  encouraged to call the office with any questions or concerns that should arise related to todays visit.  Cardiac risk factor modification:  1. Control blood pressure. 2. Exercise as prescribed. 3. Follow low sodium, low fat diet. and low fat and low cholestrol diet. 4. Take ASA 81mg  once a day. 5. Restricted calories diet to lose weight.  This patient was seen by Leretha Pol FNP Collaboration with Dr [name] as a part of collaborative care agreement  Time spent: 25 Minutes    Dr [name] Internal medicine

## 2019-02-16 DIAGNOSIS — J9621 Acute and chronic respiratory failure with hypoxia: Secondary | ICD-10-CM | POA: Diagnosis not present

## 2019-02-16 DIAGNOSIS — N189 Chronic kidney disease, unspecified: Secondary | ICD-10-CM | POA: Diagnosis not present

## 2019-02-16 DIAGNOSIS — I5033 Acute on chronic diastolic (congestive) heart failure: Secondary | ICD-10-CM | POA: Diagnosis not present

## 2019-02-16 DIAGNOSIS — J449 Chronic obstructive pulmonary disease, unspecified: Secondary | ICD-10-CM | POA: Diagnosis not present

## 2019-02-16 DIAGNOSIS — E1122 Type 2 diabetes mellitus with diabetic chronic kidney disease: Secondary | ICD-10-CM | POA: Diagnosis not present

## 2019-02-16 DIAGNOSIS — I130 Hypertensive heart and chronic kidney disease with heart failure and stage 1 through stage 4 chronic kidney disease, or unspecified chronic kidney disease: Secondary | ICD-10-CM | POA: Diagnosis not present

## 2019-02-17 ENCOUNTER — Ambulatory Visit (INDEPENDENT_AMBULATORY_CARE_PROVIDER_SITE_OTHER): Payer: Medicare Other | Admitting: Internal Medicine

## 2019-02-17 ENCOUNTER — Encounter: Payer: Self-pay | Admitting: Internal Medicine

## 2019-02-17 ENCOUNTER — Other Ambulatory Visit: Payer: Self-pay

## 2019-02-17 VITALS — BP 130/55 | HR 73 | Resp 16 | Wt 204.0 lb

## 2019-02-17 DIAGNOSIS — I130 Hypertensive heart and chronic kidney disease with heart failure and stage 1 through stage 4 chronic kidney disease, or unspecified chronic kidney disease: Secondary | ICD-10-CM | POA: Diagnosis not present

## 2019-02-17 DIAGNOSIS — I5021 Acute systolic (congestive) heart failure: Secondary | ICD-10-CM

## 2019-02-17 DIAGNOSIS — J9621 Acute and chronic respiratory failure with hypoxia: Secondary | ICD-10-CM | POA: Diagnosis not present

## 2019-02-17 DIAGNOSIS — G4733 Obstructive sleep apnea (adult) (pediatric): Secondary | ICD-10-CM

## 2019-02-17 DIAGNOSIS — Z9989 Dependence on other enabling machines and devices: Secondary | ICD-10-CM | POA: Diagnosis not present

## 2019-02-17 DIAGNOSIS — Z9981 Dependence on supplemental oxygen: Secondary | ICD-10-CM | POA: Diagnosis not present

## 2019-02-17 DIAGNOSIS — N189 Chronic kidney disease, unspecified: Secondary | ICD-10-CM | POA: Diagnosis not present

## 2019-02-17 DIAGNOSIS — E1122 Type 2 diabetes mellitus with diabetic chronic kidney disease: Secondary | ICD-10-CM | POA: Diagnosis not present

## 2019-02-17 DIAGNOSIS — I5033 Acute on chronic diastolic (congestive) heart failure: Secondary | ICD-10-CM | POA: Diagnosis not present

## 2019-02-17 DIAGNOSIS — J449 Chronic obstructive pulmonary disease, unspecified: Secondary | ICD-10-CM | POA: Diagnosis not present

## 2019-02-17 NOTE — Progress Notes (Signed)
[facility] [place], [place] [address]  Pulmonary Sleep Medicine   Office Visit Note  Patient Name: Kimberly Peterson DOB: 1929/12/18 MRN [id number]  Date of Service: 02/17/2019  Complaints/HPI: Pt seen via telephone.  She reports she went to the ED for increasing shortness of breath. She was admitted for 2 days.  She reports she was on diuretics for two days and was discharged feeling much better.  She denies any new symptoms at this time.  Overall she is doing well.     ROS  General: (-) fever, (-) chills, (-) night sweats, (-) weakness Skin: (-) rashes, (-) itching,. Eyes: (-) visual changes, (-) redness, (-) itching. Nose and Sinuses: (-) nasal stuffiness or itchiness, (-) postnasal drip, (-) nosebleeds, (-) sinus trouble. Mouth and Throat: (-) sore throat, (-) hoarseness. Neck: (-) swollen glands, (-) enlarged thyroid, (-) neck pain. Respiratory: - cough, (-) bloody sputum, - shortness of breath, - wheezing. Cardiovascular: - ankle swelling, (-) chest pain. Lymphatic: (-) lymph node enlargement. Neurologic: (-) numbness, (-) tingling. Psychiatric: (-) anxiety, (-) depression   Current Medication: Outpatient Encounter Medications as of 02/17/2019  Medication Sig  . aspirin 81 MG tablet Take 81 mg by mouth daily.  Marland Kitchen atorvastatin (LIPITOR) 10 MG tablet TAKE 1 TABLET BY MOUTH ONCE DAILY FOR CHOLESTEROL  . carvedilol (COREG) 25 MG tablet Take 1 tablet by mouth twice daily  . furosemide (LASIX) 40 MG tablet TAKE 1 TABLET BY MOUTH ONCE DAILY FOR FLUID. MAY TAKE ONE HALF TABLET EXTRA IF NEEDED  . hydrALAZINE (APRESOLINE) 25 MG tablet TAKE 1 & 1/2 (ONE & ONE-HALF) TABLETS BY MOUTH THREE TIMES DAILY FOR BLOOD PRESSURE  . insulin aspart protamine- aspart (NOVOLOG MIX 70/30) (70-30) 100 UNIT/ML injection Inject 21-34 Units into the skin 2 (two) times daily with a meal. 34 units in morning and 21 units in the evening  . pantoprazole (PROTONIX) 40 MG tablet Take 40  mg by mouth daily.  Marland Kitchen warfarin (COUMADIN) 5 MG tablet TAKE ONE TABLET BY MOUTH ONCE DAILY AT 5-6PM AND TAKE ONE AND ONE-HALF TABLET BY MOUTH ON THURSDAY   No facility-administered encounter medications on file as of 02/17/2019.     Surgical History: Past Surgical History:  Procedure Laterality Date  . aoric valve replacemet      st Jude  . CATARACT EXTRACTION, BILATERAL    . PACEMAKER PLACEMENT    . VALVE REPLACEMENT      Medical History: Past Medical History:  Diagnosis Date  . CHF (congestive heart failure) ([place])   . Chronic kidney disease   . COPD (chronic obstructive pulmonary disease) ([place])   . Diabetes mellitus without complication ([place])   . Hypertension     Family History: History reviewed. No pertinent family history.  Social History: Social History   Socioeconomic History  . Marital status: Married    Spouse name: Not on file  . Number of children: Not on file  . Years of education: Not on file  . Highest education level: Not on file  Occupational History  . Not on file  Social Needs  . Financial resource strain: Not hard at all  . Food insecurity    Worry: Never true    Inability: Never true  . Transportation needs    Medical: No    Non-medical: No  Tobacco Use  . Smoking status: Never Smoker  . Smokeless tobacco: Never Used  Substance and Sexual Activity  . Alcohol use: No  .  Drug use: No  . Sexual activity: Never  Lifestyle  . Physical activity    Days per week: Not on file    Minutes per session: Not on file  . Stress: Not on file  Relationships  . Social Herbalist on phone: Not on file    Gets together: Not on file    Attends religious service: Not on file    Active member of club or organization: Not on file    Attends meetings of clubs or organizations: Not on file    Relationship status: Not on file  . Intimate partner violence    Fear of current or ex partner: Not on file    Emotionally abused: Not on file     Physically abused: Not on file    Forced sexual activity: Not on file  Other Topics Concern  . Not on file  Social History Narrative  . Not on file    Vital Signs: Blood pressure (!) 130/55, pulse 73, resp. rate 16, weight 204 lb (92.5 kg).  Examination: General Appearance: The patient is well-developed, well-nourished, and in no distress. Skin: Gross inspection of skin unremarkable. Head: normocephalic, no gross deformities. Eyes: no gross deformities noted. ENT: ears appear grossly normal no exudates. Neck: Supple. No thyromegaly. No LAD. Respiratory: clear bilaterally. Cardiovascular: Normal S1 and S2 without murmur or rub. Extremities: No cyanosis. pulses are equal. Neurologic: Alert and oriented. No involuntary movements.  LABS: Recent Results (from the past 2160 hour(s))  Protime-INR     Status: Abnormal   Collection Time: 11/22/18 12:00 AM  Result Value Ref Range   INR 2.2 (A) 0.9 - 1.1  Protime-INR     Status: Abnormal   Collection Time: 11/29/18 12:00 AM  Result Value Ref Range   INR 2.5 (A) 0.9 - 1.1  Protime-INR     Status: Abnormal   Collection Time: 12/06/18 12:00 AM  Result Value Ref Range   INR 2.1 (A) 0.9 - 1.1  Protime-INR     Status: Abnormal   Collection Time: 12/13/18 12:00 AM  Result Value Ref Range   INR 2.3 (A) 0.9 - 1.1  Protime-INR     Status: Abnormal   Collection Time: 01/03/19 12:00 AM  Result Value Ref Range   INR 1.9 (A) 0.9 - 1.1  Protime-INR     Status: Abnormal   Collection Time: 01/17/19 12:00 AM  Result Value Ref Range   INR 2.7 (A) 0.9 - 1.1  Protime-INR     Status: Abnormal   Collection Time: 01/24/19 12:00 AM  Result Value Ref Range   INR 2.2 (A) 0.9 - 1.1  Protime-INR     Status: Abnormal   Collection Time: 01/31/19 12:00 AM  Result Value Ref Range   INR 2.3 (A) 0.9 - 1.1  CBC with Differential     Status: Abnormal   Collection Time: 02/07/19 10:11 AM  Result Value Ref Range   WBC 7.0 4.0 - 10.5 K/uL   RBC 4.36 3.87 -  5.11 MIL/uL   Hemoglobin 11.3 (L) 12.0 - 15.0 g/dL   HCT 37.9 36.0 - 46.0 %   MCV 86.9 80.0 - 100.0 fL   MCH 25.9 (L) 26.0 - 34.0 pg   MCHC 29.8 (L) 30.0 - 36.0 g/dL   RDW 15.0 11.5 - 15.5 %   Platelets 266 150 - 400 K/uL   nRBC 0.0 0.0 - 0.2 %   Neutrophils Relative % 69 %   Neutro Abs 4.9  1.7 - 7.7 K/uL   Lymphocytes Relative 18 %   Lymphs Abs 1.2 0.7 - 4.0 K/uL   Monocytes Relative 10 %   Monocytes Absolute 0.7 0.1 - 1.0 K/uL   Eosinophils Relative 2 %   Eosinophils Absolute 0.1 0.0 - 0.5 K/uL   Basophils Relative 1 %   Basophils Absolute 0.0 0.0 - 0.1 K/uL   Immature Granulocytes 0 %   Abs Immature Granulocytes 0.02 0.00 - 0.07 K/uL    Comment: Performed at [facility], [address] [place], [place] [address]  Basic metabolic panel     Status: Abnormal   Collection Time: 02/07/19 10:11 AM  Result Value Ref Range   Sodium 138 135 - 145 mmol/L   Potassium 4.0 3.5 - 5.1 mmol/L   Chloride 99 98 - 111 mmol/L   CO2 28 22 - 32 mmol/L   Glucose, Bld 127 (H) 70 - 99 mg/dL   BUN 15 8 - 23 mg/dL   Creatinine, Ser 1.02 (H) 0.44 - 1.00 mg/dL   Calcium 9.1 8.9 - 10.3 mg/dL   GFR calc non Af Amer 49 (L) >60 mL/min   GFR calc Af Amer 57 (L) >60 mL/min   Anion gap 11 5 - 15    Comment: Performed at [facility], [address] [place], [place] [address]  Brain natriuretic peptide     Status: Abnormal   Collection Time: 02/07/19 10:11 AM  Result Value Ref Range   B Natriuretic Peptide 476.0 (H) 0.0 - 100.0 pg/mL    Comment: Performed at [facility], [place], [place] [address]  Troponin I (High Sensitivity)     Status: Abnormal   Collection Time: 02/07/19 10:11 AM  Result Value Ref Range   Troponin I (High Sensitivity) 48 (H) <18 ng/L    Comment: (NOTE) Elevated high sensitivity troponin I (hsTnI) values and significant  changes across serial measurements may suggest ACS but many other  chronic and acute conditions are known to  elevate hsTnI results.  Refer to the "Links" section for chest pain algorithms and additional  guidance. Performed at [facility], [address] [place], [place] [address]   SARS Coronavirus 2 [facility] order, Performed in [facility] hospital lab)     Status: None   Collection Time: 02/07/19 10:41 AM  Result Value Ref Range   SARS Coronavirus 2 NEGATIVE NEGATIVE    Comment: (NOTE) If result is NEGATIVE SARS-CoV-2 target nucleic acids are NOT DETECTED. The SARS-CoV-2 RNA is generally detectable in upper and lower  respiratory specimens during the acute phase of infection. The lowest  concentration of SARS-CoV-2 viral copies this assay can detect is 250  copies / mL. A negative result does not preclude SARS-CoV-2 infection  and should not be used as the sole basis for treatment or other  patient management decisions.  A negative result may occur with  improper specimen collection / handling, submission of specimen other  than nasopharyngeal swab, presence of viral mutation(s) within the  areas targeted by this assay, and inadequate number of viral copies  (<250 copies / mL). A negative result must be combined with clinical  observations, patient history, and epidemiological information. If result is POSITIVE SARS-CoV-2 target nucleic acids are DETECTED. The SARS-CoV-2 RNA is generally detectable in upper and lower  respiratory specimens dur ing the acute phase of infection.  Positive  results are indicative of active infection with SARS-CoV-2.  Clinical  correlation with patient history  and other diagnostic information is  necessary to determine patient infection status.  Positive results do  not rule out bacterial infection or co-infection with other viruses. If result is PRESUMPTIVE POSTIVE SARS-CoV-2 nucleic acids MAY BE PRESENT.   A presumptive positive result was obtained on the submitted specimen  and confirmed on repeat testing.  While 2019 novel coronavirus   (SARS-CoV-2) nucleic acids may be present in the submitted sample  additional confirmatory testing may be necessary for epidemiological  and / or clinical management purposes  to differentiate between  SARS-CoV-2 and other Sarbecovirus currently known to infect humans.  If clinically indicated additional testing with an alternate test  methodology [id number]) is advised. The SARS-CoV-2 RNA is generally  detectable in upper and lower respiratory sp ecimens during the acute  phase of infection. The expected result is Negative. Fact Sheet for Patients:  StrictlyIdeas.no Fact Sheet for Healthcare Providers: BankingDealers.co.za This test is not yet approved or cleared by the [place] FDA and has been authorized for detection and/or diagnosis of SARS-CoV-2 by FDA under an Emergency Use Authorization (EUA).  This EUA will remain in effect (meaning this test can be used) for the duration of the COVID-19 declaration under Section 564(b)(1) of the Act, 21 U.S.C. section 360bbb-3(b)(1), unless the authorization is terminated or revoked sooner. Performed at [facility], [address] [place], [place] [address]   Glucose, capillary     Status: Abnormal   Collection Time: 02/07/19 12:50 PM  Result Value Ref Range   Glucose-Capillary 153 (H) 70 - 99 mg/dL   Comment 1 Notify RN    Comment 2 Document in Chart   Hemoglobin A1c     Status: None   Collection Time: 02/07/19  1:45 PM  Result Value Ref Range   Hgb A1c MFr Bld 5.6 4.8 - 5.6 %    Comment: (NOTE) Pre diabetes:          5.7%-6.4% Diabetes:              >6.4% Glycemic control for   <7.0% adults with diabetes    Mean Plasma Glucose 114.02 mg/dL    Comment: Performed at [facility] [address], [place], [place] [address]  Protime-INR     Status: Abnormal   Collection Time: 02/07/19  1:45 PM  Result Value Ref Range   Prothrombin Time 26.6 (H) 11.4 - 15.2 seconds    INR 2.5 (H) 0.8 - 1.2    Comment: (NOTE) INR goal varies based on device and disease states. Performed at [facility], [address] [place], [place] [address]   Glucose, capillary     Status: Abnormal   Collection Time: 02/07/19  4:51 PM  Result Value Ref Range   Glucose-Capillary 178 (H) 70 - 99 mg/dL  Glucose, capillary     Status: Abnormal   Collection Time: 02/07/19  8:56 PM  Result Value Ref Range   Glucose-Capillary 265 (H) 70 - 99 mg/dL   Comment 1 Notify RN    Comment 2 Document in Chart   Basic metabolic panel     Status: Abnormal   Collection Time: 02/08/19  6:14 AM  Result Value Ref Range   Sodium 138 135 - 145 mmol/L   Potassium 4.0 3.5 - 5.1 mmol/L   Chloride 98 98 - 111 mmol/L   CO2 33 (H) 22 - 32 mmol/L   Glucose, Bld 178 (H) 70 - 99 mg/dL   BUN 19 8 - 23 mg/dL  Creatinine, Ser 0.94 0.44 - 1.00 mg/dL   Calcium 8.7 (L) 8.9 - 10.3 mg/dL   GFR calc non Af Amer 55 (L) >60 mL/min   GFR calc Af Amer >60 >60 mL/min   Anion gap 7 5 - 15    Comment: Performed at [facility], [address] [place], [place] [address]  Protime-INR     Status: Abnormal   Collection Time: 02/08/19  6:14 AM  Result Value Ref Range   Prothrombin Time 28.9 (H) 11.4 - 15.2 seconds   INR 2.8 (H) 0.8 - 1.2    Comment: (NOTE) INR goal varies based on device and disease states. Performed at [facility], [address] [place], [place] [address]   CBC     Status: Abnormal   Collection Time: 02/08/19  6:14 AM  Result Value Ref Range   WBC 6.1 4.0 - 10.5 K/uL   RBC 4.09 3.87 - 5.11 MIL/uL   Hemoglobin 10.6 (L) 12.0 - 15.0 g/dL   HCT 36.4 36.0 - 46.0 %   MCV 89.0 80.0 - 100.0 fL   MCH 25.9 (L) 26.0 - 34.0 pg   MCHC 29.1 (L) 30.0 - 36.0 g/dL   RDW 15.1 11.5 - 15.5 %   Platelets 216 150 - 400 K/uL   nRBC 0.0 0.0 - 0.2 %    Comment: Performed at [facility], [address] [place], [place] [address]  Glucose, capillary     Status:  Abnormal   Collection Time: 02/08/19  7:53 AM  Result Value Ref Range   Glucose-Capillary 168 (H) 70 - 99 mg/dL  ECHOCARDIOGRAM COMPLETE     Status: None   Collection Time: 02/08/19  9:30 AM  Result Value Ref Range   Weight 3,334.4 oz   Height 62 in   BP 129/75 mmHg  Glucose, capillary     Status: Abnormal   Collection Time: 02/08/19 11:31 AM  Result Value Ref Range   Glucose-Capillary 247 (H) 70 - 99 mg/dL  Glucose, capillary     Status: None   Collection Time: 02/08/19  4:45 PM  Result Value Ref Range   Glucose-Capillary 91 70 - 99 mg/dL  Glucose, capillary     Status: Abnormal   Collection Time: 02/08/19  9:03 PM  Result Value Ref Range   Glucose-Capillary 247 (H) 70 - 99 mg/dL  Basic metabolic panel     Status: Abnormal   Collection Time: 02/09/19  5:49 AM  Result Value Ref Range   Sodium 140 135 - 145 mmol/L   Potassium 4.3 3.5 - 5.1 mmol/L   Chloride 97 (L) 98 - 111 mmol/L   CO2 34 (H) 22 - 32 mmol/L   Glucose, Bld 205 (H) 70 - 99 mg/dL   BUN 21 8 - 23 mg/dL   Creatinine, Ser 1.13 (H) 0.44 - 1.00 mg/dL   Calcium 8.8 (L) 8.9 - 10.3 mg/dL   GFR calc non Af Amer 44 (L) >60 mL/min   GFR calc Af Amer 51 (L) >60 mL/min   Anion gap 9 5 - 15    Comment: Performed at [facility], [address] [place],  [address]  Protime-INR     Status: Abnormal   Collection Time: 02/09/19  5:49 AM  Result Value Ref Range   Prothrombin Time 30.1 (H) 11.4 - 15.2 seconds   INR 2.9 (H) 0.8 - 1.2    Comment: (NOTE) INR goal varies based on device and disease states. Performed at  [facility], [address] [place], [place] [address]   Glucose, capillary     Status: Abnormal   Collection Time: 02/09/19  7:40 AM  Result Value Ref Range   Glucose-Capillary 190 (H) 70 - 99 mg/dL  Glucose, capillary     Status: Abnormal   Collection Time: 02/09/19 11:39 AM  Result Value Ref Range   Glucose-Capillary 267 (H) 70 - 99 mg/dL    Radiology: No results  found.  No results found.  Dg Chest Port 1 View  Result Date: 02/07/2019 CLINICAL DATA:  Shortness of breath. EXAM: PORTABLE CHEST 1 VIEW COMPARISON:  08/02/2018. FINDINGS: Cardiac pacer stable position. Prior median sternotomy. Cardiomegaly with pulmonary venous congestion and bilateral interstitial prominence. Bilateral pleural effusion noted. Similar findings noted on prior exam is suggest CHF. Pneumonitis cannot be excluded. IMPRESSION: Cardiac pacer stable position. Prior median sternotomy. Cardiomegaly with pulmonary vascular congestion, bilateral interstitial prominence, and small bilateral pleural effusions suggesting CHF. Similar findings noted on prior exam. Electronically Signed   By: [place]   On: 02/07/2019 10:43      Assessment and Plan: Patient Active Problem List   Diagnosis Date Noted  . Acute respiratory failure with hypoxia ([place]) 02/07/2019  . Type 2 diabetes with peripheral circulatory disorder, controlled ([place]) 10/17/2018  . Complete heart block ([place]) 09/09/2018  . Severe aortic valve stenosis 09/09/2018  . Lymphedema 08/12/2018  . Uncontrolled type 2 diabetes mellitus with hyperglycemia ([place]) 02/25/2018  . Atrial fibrillation ([place]) 02/25/2018  . Encounter for therapeutic drug monitoring 02/25/2018  . Dependence on supplemental oxygen 02/25/2018  . Lower extremity edema 02/25/2018  . CHF (congestive heart failure) ([place]) 12/04/2015  . Diabetes mellitus ([place]) 12/04/2015  . Chronic obstructive pulmonary disease ([place]) 09/20/2015  . Diastolic CHF, acute on chronic (HCC) 09/19/2015  . Benign essential HTN 09/19/2015  . Controlled diabetes mellitus without complication, with long-term current use of insulin ([place]) 09/19/2015  . Hyperlipidemia, mixed 09/04/2014  . Pulmonary embolism ([place]) 09/23/2013    1. Acute systolic congestive heart failure ([place]) Continue to follow up with cardiology as discussed.  Stable at this time.   2. Dependence on supplemental  oxygen Continue to use oxygen as prescribed, continuously.   3. OSA on CPAP Continue to use cpap when sleeping.    General Counseling: I have discussed the findings of the evaluation and examination with Estelita.  I have also discussed any further diagnostic evaluation thatmay be needed or ordered today. Malgorzata verbalizes understanding of the findings of todays visit. We also reviewed her medications today and discussed drug interactions and side effects including but not limited excessive drowsiness and altered mental states. We also discussed that there is always a risk not just to her but also people around her. she has been encouraged to call the office with any questions or concerns that should arise related to todays visit.    Time spent: 14 This patient was seen by Orson Gear AGNP-C in Collaboration with Dr. Devona Konig as a part of collaborative care agreement.   I have personally obtained a history, examined the patient, evaluated laboratory and imaging results, formulated the assessment and plan and placed orders.    Allyne Gee, MD [facility] Pulmonary and Critical Care Sleep medicine

## 2019-02-17 NOTE — Progress Notes (Deleted)
Patient ID: Kimberly Peterson, female    DOB: [birth year], 85 y.o.   MRN: [id number]  HPI  Kimberly Peterson is a 85 y/o female with a history of DM, HTN, CKD, COPD and chronic heart failure.   Echo report from 02/08/2019 reviewed and showed an EF of 60-65% along with trivial AR. Echo report from 08/03/2018 reviewed and showed an EF of 55-65% along with an elevated PA pressure of 78 mm Hg.  Admitted 02/07/2019 due to acute HF exacerbation. Initially needed IV lasix and then transitioned to oral diuretics. Diuresed >4L. Cardiology consult obtained. Elevated troponin thought to be due to demand ischemia. Discharged after 2 days. Admitted 08/02/2018 due to acute on chronic HF. Cardiology consult obtained. Initially given IV lasix and then transitioned to oral diuretics. Wound consult also obtained due to blisters on her legs. Discharged after 3 days.   She presents today for a follow-up visit with a chief complaint of   Past Medical History:  Diagnosis Date  . CHF (congestive heart failure) ([place])   . Chronic kidney disease   . COPD (chronic obstructive pulmonary disease) ([place])   . Diabetes mellitus without complication ([place])   . Hypertension    Past Surgical History:  Procedure Laterality Date  . aoric valve replacemet      st Jude  . CATARACT EXTRACTION, BILATERAL    . PACEMAKER PLACEMENT    . VALVE REPLACEMENT     No family history on file. Social History   Tobacco Use  . Smoking status: Never Smoker  . Smokeless tobacco: Never Used  Substance Use Topics  . Alcohol use: No   No Known Allergies   Review of Systems  Constitutional: Negative for appetite change and fatigue.  HENT: Negative for congestion, rhinorrhea and sore throat.   Eyes: Negative.   Respiratory: Positive for shortness of breath. Negative for cough.   Cardiovascular: Positive for leg swelling (1+ pitting). Negative for chest pain and palpitations.  Gastrointestinal: Negative for abdominal distention and abdominal pain.   Endocrine: Negative.   Genitourinary: Negative.   Musculoskeletal: Negative for back pain and neck pain.  Skin: Positive for wound (left lower leg open wound).  Allergic/Immunologic: Negative.   Neurological: Negative for dizziness and light-headedness.  Hematological: Negative for adenopathy. Does not bruise/bleed easily.  Psychiatric/Behavioral: Negative for dysphoric mood and sleep disturbance (wearing oxygen at 2L around the clock). The patient is not nervous/anxious.       Physical Exam Vitals signs and nursing note reviewed.  Constitutional:      Appearance: She is well-developed.  HENT:     Head: Normocephalic and atraumatic.  Neck:     Musculoskeletal: Normal range of motion and neck supple.     Vascular: No JVD.  Cardiovascular:     Rate and Rhythm: Normal rate and regular rhythm.  Pulmonary:     Effort: Pulmonary effort is normal.     Breath sounds: No wheezing or rales.  Abdominal:     Palpations: Abdomen is soft.     Tenderness: There is no abdominal tenderness.  Musculoskeletal:     Right lower leg: She exhibits no tenderness. Edema (1+ pitting) present.     Left lower leg: She exhibits no tenderness. Edema (1+ pitting) present.  Skin:    General: Skin is warm and dry.  Neurological:     General: No focal deficit present.     Mental Status: She is alert and oriented to person, place, and time.  Psychiatric:  Mood and Affect: Mood normal.        Behavior: Behavior normal.    Assessment & Plan:  1: Chronic heart failure with preserved ejection fraction- - NYHA class II - euvolemic today - having difficulty weighing because of being unsteady on her feet. Advised her to put her walker over the scale and that she could gently hold the walker for stability. Instructed to call for an overnight weight gain of >2 pounds or a weekly weight gain of >5 pounds - weight 212.4 from last visit here 6 months ago - not adding salt and reviewed the importance of  closely following a 2000mg  sodium diet.  - saw cardiology Kimberly Peterson) in the past but has to make another appointment with him/ his office number provided to them upon their request - [facility] coming to the home - BNP 02/07/2019 was 476.0  2: HTN- - BP  - saw PCP Kimberly Peterson) 02/15/2019 - BMP from 02/09/2019 reviewed and showed sodium 140, potassium 4.3, creatinine 1.13 and GFR 51  3: DM- - glucose at home today was  - A1c 02/07/2019 was 5.6%  4: COPD- - saw pulmonology Kimberly Peterson) 12/28/2018 - wearing oxygen at 2L around the clock  5: Lymphedema- - stage 2 - unable to wear TED hose as she says that they cut into her legs and she is unable to get them on - encouraged her to elevate her legs when sitting for long periods of time - limited in her ability to exercise due to her being unsteady on her feet - consider lymphapress compression boots if edema persists after above therapies  Medications list was reviewed.

## 2019-02-18 ENCOUNTER — Ambulatory Visit: Payer: Medicare Other | Admitting: Family

## 2019-02-18 DIAGNOSIS — J449 Chronic obstructive pulmonary disease, unspecified: Secondary | ICD-10-CM | POA: Diagnosis not present

## 2019-02-18 DIAGNOSIS — I5033 Acute on chronic diastolic (congestive) heart failure: Secondary | ICD-10-CM | POA: Diagnosis not present

## 2019-02-18 DIAGNOSIS — E1122 Type 2 diabetes mellitus with diabetic chronic kidney disease: Secondary | ICD-10-CM | POA: Diagnosis not present

## 2019-02-18 DIAGNOSIS — J9621 Acute and chronic respiratory failure with hypoxia: Secondary | ICD-10-CM | POA: Diagnosis not present

## 2019-02-18 DIAGNOSIS — N189 Chronic kidney disease, unspecified: Secondary | ICD-10-CM | POA: Diagnosis not present

## 2019-02-18 DIAGNOSIS — I130 Hypertensive heart and chronic kidney disease with heart failure and stage 1 through stage 4 chronic kidney disease, or unspecified chronic kidney disease: Secondary | ICD-10-CM | POA: Diagnosis not present

## 2019-02-21 ENCOUNTER — Other Ambulatory Visit: Payer: Self-pay

## 2019-02-21 ENCOUNTER — Ambulatory Visit (INDEPENDENT_AMBULATORY_CARE_PROVIDER_SITE_OTHER): Payer: Medicare Other

## 2019-02-21 DIAGNOSIS — Z7901 Long term (current) use of anticoagulants: Secondary | ICD-10-CM

## 2019-02-22 ENCOUNTER — Encounter: Payer: Self-pay | Admitting: Nurse Practitioner

## 2019-02-22 ENCOUNTER — Other Ambulatory Visit: Payer: Self-pay

## 2019-02-22 ENCOUNTER — Other Ambulatory Visit: Payer: Self-pay | Admitting: Internal Medicine

## 2019-02-22 DIAGNOSIS — J449 Chronic obstructive pulmonary disease, unspecified: Secondary | ICD-10-CM | POA: Diagnosis not present

## 2019-02-22 DIAGNOSIS — J9621 Acute and chronic respiratory failure with hypoxia: Secondary | ICD-10-CM | POA: Diagnosis not present

## 2019-02-22 DIAGNOSIS — I130 Hypertensive heart and chronic kidney disease with heart failure and stage 1 through stage 4 chronic kidney disease, or unspecified chronic kidney disease: Secondary | ICD-10-CM | POA: Diagnosis not present

## 2019-02-22 DIAGNOSIS — N189 Chronic kidney disease, unspecified: Secondary | ICD-10-CM | POA: Diagnosis not present

## 2019-02-22 DIAGNOSIS — E1122 Type 2 diabetes mellitus with diabetic chronic kidney disease: Secondary | ICD-10-CM | POA: Diagnosis not present

## 2019-02-22 DIAGNOSIS — I5033 Acute on chronic diastolic (congestive) heart failure: Secondary | ICD-10-CM | POA: Diagnosis not present

## 2019-02-22 LAB — PROTIME-INR
INR: 2.6 — AB (ref 0.9–1.1)
INR: 3.1 — AB (ref 0.9–1.1)

## 2019-02-22 MED ORDER — WARFARIN SODIUM 5 MG PO TABS: ORAL_TABLET | ORAL | 4 refills | Status: DC

## 2019-02-23 DIAGNOSIS — I5033 Acute on chronic diastolic (congestive) heart failure: Secondary | ICD-10-CM | POA: Diagnosis not present

## 2019-02-23 DIAGNOSIS — I130 Hypertensive heart and chronic kidney disease with heart failure and stage 1 through stage 4 chronic kidney disease, or unspecified chronic kidney disease: Secondary | ICD-10-CM | POA: Diagnosis not present

## 2019-02-23 DIAGNOSIS — E1122 Type 2 diabetes mellitus with diabetic chronic kidney disease: Secondary | ICD-10-CM | POA: Diagnosis not present

## 2019-02-23 DIAGNOSIS — J449 Chronic obstructive pulmonary disease, unspecified: Secondary | ICD-10-CM | POA: Diagnosis not present

## 2019-02-23 DIAGNOSIS — N189 Chronic kidney disease, unspecified: Secondary | ICD-10-CM | POA: Diagnosis not present

## 2019-02-23 DIAGNOSIS — J9621 Acute and chronic respiratory failure with hypoxia: Secondary | ICD-10-CM | POA: Diagnosis not present

## 2019-02-24 DIAGNOSIS — E1122 Type 2 diabetes mellitus with diabetic chronic kidney disease: Secondary | ICD-10-CM | POA: Diagnosis not present

## 2019-02-24 DIAGNOSIS — I5033 Acute on chronic diastolic (congestive) heart failure: Secondary | ICD-10-CM | POA: Diagnosis not present

## 2019-02-24 DIAGNOSIS — I130 Hypertensive heart and chronic kidney disease with heart failure and stage 1 through stage 4 chronic kidney disease, or unspecified chronic kidney disease: Secondary | ICD-10-CM | POA: Diagnosis not present

## 2019-02-24 DIAGNOSIS — J449 Chronic obstructive pulmonary disease, unspecified: Secondary | ICD-10-CM | POA: Diagnosis not present

## 2019-02-24 DIAGNOSIS — N189 Chronic kidney disease, unspecified: Secondary | ICD-10-CM | POA: Diagnosis not present

## 2019-02-24 DIAGNOSIS — J9621 Acute and chronic respiratory failure with hypoxia: Secondary | ICD-10-CM | POA: Diagnosis not present

## 2019-02-25 DIAGNOSIS — E1122 Type 2 diabetes mellitus with diabetic chronic kidney disease: Secondary | ICD-10-CM | POA: Diagnosis not present

## 2019-02-25 DIAGNOSIS — J449 Chronic obstructive pulmonary disease, unspecified: Secondary | ICD-10-CM | POA: Diagnosis not present

## 2019-02-25 DIAGNOSIS — J9621 Acute and chronic respiratory failure with hypoxia: Secondary | ICD-10-CM | POA: Diagnosis not present

## 2019-02-25 DIAGNOSIS — I5033 Acute on chronic diastolic (congestive) heart failure: Secondary | ICD-10-CM | POA: Diagnosis not present

## 2019-02-25 DIAGNOSIS — I130 Hypertensive heart and chronic kidney disease with heart failure and stage 1 through stage 4 chronic kidney disease, or unspecified chronic kidney disease: Secondary | ICD-10-CM | POA: Diagnosis not present

## 2019-02-25 DIAGNOSIS — N189 Chronic kidney disease, unspecified: Secondary | ICD-10-CM | POA: Diagnosis not present

## 2019-02-28 ENCOUNTER — Other Ambulatory Visit: Payer: Self-pay | Admitting: *Deleted

## 2019-02-28 ENCOUNTER — Other Ambulatory Visit: Payer: Self-pay

## 2019-02-28 ENCOUNTER — Ambulatory Visit (INDEPENDENT_AMBULATORY_CARE_PROVIDER_SITE_OTHER): Payer: Medicare Other

## 2019-02-28 DIAGNOSIS — Z7901 Long term (current) use of anticoagulants: Secondary | ICD-10-CM

## 2019-02-28 DIAGNOSIS — I4821 Permanent atrial fibrillation: Secondary | ICD-10-CM | POA: Diagnosis not present

## 2019-02-28 NOTE — Progress Notes (Signed)
Pt inr 6.0 as per Dr Humphrey Rolls  Skip coumadin today and tomorrow and recheck inr wed and call us back with reading

## 2019-02-28 NOTE — Patient Outreach (Signed)
[facility]) Care Management  02/28/2019  Kimberly Peterson 1929/08/26 [id number]   Call placed to member to follow up on heart failure management and to complete initial assessment.  Report she is doing better, denies any shortness of breath during time of call.  State her legs were "puffy" but went down after TED hose were put on.  She did follow up with televisit with heart failure clinic, but state she will not follow up with them again.  Prefer to attend appointments with regular cardiologist.  Report taking 2 fluid pills daily since last week (was seen by the home health nurse, weight gain reported to MD), state nurse will be back tomorrow for assessment.  Denies any needs at this time, daughter remain in the hoe and available for assistance.  Will follow up within the next month.  THN CM Care Plan Problem One     Most Recent Value  Care Plan Problem One  Risk for readmission related to heart failure management as evidenced by recent hospitalization  Role Documenting the Problem One  Care Management [place] for Problem One  Active  THN Long Term Goal   Member will not be readmitted to hospital within the next 31 days  THN Long Term Goal Start Date  02/14/19  Interventions for Problem One Long Term Goal  Discussed plan of care with member, including involvement with home health, daily weights, and compliance with oxygen therapy  [facility]N CM Short Term Goal #1   Member and daughter will verbalize understanding of yellow zone on heart failure action plan within the next 4 weeks  THN CM Short Term Goal #1 Start Date  02/14/19  Interventions for Short Term Goal #1  Reviewed heart failure action plan with member, advised on signs/symptoms and when to call MD office  [facility] CM Short Term Goal #2   Member will keep and attend follow up appointments with primary MD and heart failure clinic within the next 2 weeks   THN CM Short Term Goal #2 Start Date  02/14/19  [facility] CM Short  Term Goal #2 Met Date  02/28/19      Valente David, RN, MSN [place] Manager [phone]

## 2019-03-01 ENCOUNTER — Emergency Department: Payer: Medicare Other

## 2019-03-01 ENCOUNTER — Other Ambulatory Visit: Payer: Self-pay

## 2019-03-01 ENCOUNTER — Inpatient Hospital Stay
Admission: EM | Admit: 2019-03-01 | Discharge: 2019-03-05 | DRG: 291 | Disposition: A | Discharge status: Home Health | Payer: Medicare Other | Attending: Internal Medicine | Admitting: Internal Medicine

## 2019-03-01 DIAGNOSIS — Z515 Encounter for palliative care: Secondary | ICD-10-CM | POA: Diagnosis not present

## 2019-03-01 DIAGNOSIS — I480 Paroxysmal atrial fibrillation: Secondary | ICD-10-CM | POA: Diagnosis present

## 2019-03-01 DIAGNOSIS — R069 Unspecified abnormalities of breathing: Secondary | ICD-10-CM | POA: Diagnosis not present

## 2019-03-01 DIAGNOSIS — Z9981 Dependence on supplemental oxygen: Secondary | ICD-10-CM | POA: Diagnosis not present

## 2019-03-01 DIAGNOSIS — Z9841 Cataract extraction status, right eye: Secondary | ICD-10-CM

## 2019-03-01 DIAGNOSIS — Z79899 Other long term (current) drug therapy: Secondary | ICD-10-CM | POA: Diagnosis not present

## 2019-03-01 DIAGNOSIS — I10 Essential (primary) hypertension: Secondary | ICD-10-CM | POA: Diagnosis not present

## 2019-03-01 DIAGNOSIS — G4733 Obstructive sleep apnea (adult) (pediatric): Secondary | ICD-10-CM | POA: Diagnosis present

## 2019-03-01 DIAGNOSIS — D631 Anemia in chronic kidney disease: Secondary | ICD-10-CM | POA: Diagnosis present

## 2019-03-01 DIAGNOSIS — J984 Other disorders of lung: Secondary | ICD-10-CM | POA: Diagnosis not present

## 2019-03-01 DIAGNOSIS — I4891 Unspecified atrial fibrillation: Secondary | ICD-10-CM | POA: Diagnosis not present

## 2019-03-01 DIAGNOSIS — I130 Hypertensive heart and chronic kidney disease with heart failure and stage 1 through stage 4 chronic kidney disease, or unspecified chronic kidney disease: Principal | ICD-10-CM | POA: Diagnosis present

## 2019-03-01 DIAGNOSIS — Z20828 Contact with and (suspected) exposure to other viral communicable diseases: Secondary | ICD-10-CM | POA: Diagnosis present

## 2019-03-01 DIAGNOSIS — J449 Chronic obstructive pulmonary disease, unspecified: Secondary | ICD-10-CM | POA: Diagnosis present

## 2019-03-01 DIAGNOSIS — Z952 Presence of prosthetic heart valve: Secondary | ICD-10-CM

## 2019-03-01 DIAGNOSIS — N182 Chronic kidney disease, stage 2 (mild): Secondary | ICD-10-CM | POA: Diagnosis present

## 2019-03-01 DIAGNOSIS — Z7189 Other specified counseling: Secondary | ICD-10-CM | POA: Diagnosis not present

## 2019-03-01 DIAGNOSIS — R109 Unspecified abdominal pain: Secondary | ICD-10-CM | POA: Diagnosis not present

## 2019-03-01 DIAGNOSIS — E119 Type 2 diabetes mellitus without complications: Secondary | ICD-10-CM | POA: Diagnosis not present

## 2019-03-01 DIAGNOSIS — I509 Heart failure, unspecified: Secondary | ICD-10-CM

## 2019-03-01 DIAGNOSIS — R680 Hypothermia, not associated with low environmental temperature: Secondary | ICD-10-CM | POA: Diagnosis present

## 2019-03-01 DIAGNOSIS — I491 Atrial premature depolarization: Secondary | ICD-10-CM | POA: Diagnosis not present

## 2019-03-01 DIAGNOSIS — J9621 Acute and chronic respiratory failure with hypoxia: Secondary | ICD-10-CM | POA: Diagnosis present

## 2019-03-01 DIAGNOSIS — Z6838 Body mass index (BMI) 38.0-38.9, adult: Secondary | ICD-10-CM | POA: Diagnosis not present

## 2019-03-01 DIAGNOSIS — R0902 Hypoxemia: Secondary | ICD-10-CM | POA: Diagnosis not present

## 2019-03-01 DIAGNOSIS — N179 Acute kidney failure, unspecified: Secondary | ICD-10-CM | POA: Diagnosis not present

## 2019-03-01 DIAGNOSIS — Z86711 Personal history of pulmonary embolism: Secondary | ICD-10-CM

## 2019-03-01 DIAGNOSIS — Z7982 Long term (current) use of aspirin: Secondary | ICD-10-CM

## 2019-03-01 DIAGNOSIS — I442 Atrioventricular block, complete: Secondary | ICD-10-CM | POA: Diagnosis present

## 2019-03-01 DIAGNOSIS — Z209 Contact with and (suspected) exposure to unspecified communicable disease: Secondary | ICD-10-CM | POA: Diagnosis not present

## 2019-03-01 DIAGNOSIS — Z9842 Cataract extraction status, left eye: Secondary | ICD-10-CM

## 2019-03-01 DIAGNOSIS — Z950 Presence of cardiac pacemaker: Secondary | ICD-10-CM

## 2019-03-01 DIAGNOSIS — R7989 Other specified abnormal findings of blood chemistry: Secondary | ICD-10-CM | POA: Diagnosis not present

## 2019-03-01 DIAGNOSIS — I5031 Acute diastolic (congestive) heart failure: Secondary | ICD-10-CM | POA: Diagnosis not present

## 2019-03-01 DIAGNOSIS — E1165 Type 2 diabetes mellitus with hyperglycemia: Secondary | ICD-10-CM | POA: Diagnosis present

## 2019-03-01 DIAGNOSIS — Z7901 Long term (current) use of anticoagulants: Secondary | ICD-10-CM

## 2019-03-01 DIAGNOSIS — I5033 Acute on chronic diastolic (congestive) heart failure: Secondary | ICD-10-CM | POA: Diagnosis present

## 2019-03-01 DIAGNOSIS — N189 Chronic kidney disease, unspecified: Secondary | ICD-10-CM | POA: Diagnosis not present

## 2019-03-01 DIAGNOSIS — E1122 Type 2 diabetes mellitus with diabetic chronic kidney disease: Secondary | ICD-10-CM | POA: Diagnosis present

## 2019-03-01 DIAGNOSIS — Z794 Long term (current) use of insulin: Secondary | ICD-10-CM

## 2019-03-01 DIAGNOSIS — R5381 Other malaise: Secondary | ICD-10-CM | POA: Diagnosis present

## 2019-03-01 DIAGNOSIS — R0602 Shortness of breath: Secondary | ICD-10-CM | POA: Diagnosis not present

## 2019-03-01 DIAGNOSIS — E782 Mixed hyperlipidemia: Secondary | ICD-10-CM | POA: Diagnosis present

## 2019-03-01 LAB — BASIC METABOLIC PANEL
Anion gap: 9 (ref 5–15)
BUN: 17 mg/dL (ref 8–23)
CO2: 28 mmol/L (ref 22–32)
Calcium: 9 mg/dL (ref 8.9–10.3)
Chloride: 103 mmol/L (ref 98–111)
Creatinine, Ser: 0.91 mg/dL (ref 0.44–1.00)
GFR calc Af Amer: >60 mL/min (ref >60)
GFR calc non Af Amer: 57 mL/min — ABNORMAL LOW (ref >60)
Glucose, Bld: 145 mg/dL — ABNORMAL HIGH (ref 70–99)
Potassium: 4.1 mmol/L (ref 3.5–5.1)
Sodium: 140 mmol/L (ref 135–145)

## 2019-03-01 LAB — PROTIME-INR
INR: 5.3 (ref 0.8–1.2)
Prothrombin Time: 47.4 seconds — ABNORMAL HIGH (ref 11.4–15.2)

## 2019-03-01 LAB — APTT: aPTT: 51 seconds — ABNORMAL HIGH (ref 24–36)

## 2019-03-01 LAB — CBC WITH DIFFERENTIAL/PLATELET
Abs Immature Granulocytes: 0.02 10*3/uL (ref 0.00–0.07)
Basophils Absolute: 0 10*3/uL (ref 0.0–0.1)
Basophils Relative: 1 %
Eosinophils Absolute: 0.1 10*3/uL (ref 0.0–0.5)
Eosinophils Relative: 2 %
HCT: 36.4 % (ref 36.0–46.0)
Hemoglobin: 10.7 g/dL — ABNORMAL LOW (ref 12.0–15.0)
Immature Granulocytes: 0 %
Lymphocytes Relative: 17 %
Lymphs Abs: 1.2 10*3/uL (ref 0.7–4.0)
MCH: 25.3 pg — ABNORMAL LOW (ref 26.0–34.0)
MCHC: 29.4 g/dL — ABNORMAL LOW (ref 30.0–36.0)
MCV: 86.1 fL (ref 80.0–100.0)
Monocytes Absolute: 0.7 10*3/uL (ref 0.1–1.0)
Monocytes Relative: 10 %
Neutro Abs: 4.9 10*3/uL (ref 1.7–7.7)
Neutrophils Relative %: 70 %
Platelets: 323 10*3/uL (ref 150–400)
RBC: 4.23 MIL/uL (ref 3.87–5.11)
RDW: 15.7 % — ABNORMAL HIGH (ref 11.5–15.5)
WBC: 7 10*3/uL (ref 4.0–10.5)
nRBC: 0 % (ref 0.0–0.2)

## 2019-03-01 LAB — GLUCOSE, CAPILLARY
Glucose-Capillary: 208 mg/dL — ABNORMAL HIGH (ref 70–99)
Glucose-Capillary: 240 mg/dL — ABNORMAL HIGH (ref 70–99)

## 2019-03-01 LAB — MRSA PCR SCREENING: MRSA by PCR: NEGATIVE

## 2019-03-01 LAB — SARS CORONAVIRUS 2 (TAT 6-24 HRS): SARS Coronavirus 2: NEGATIVE

## 2019-03-01 LAB — TROPONIN I (HIGH SENSITIVITY)
Troponin I (High Sensitivity): 33 ng/L — ABNORMAL HIGH (ref <18)
Troponin I (High Sensitivity): 67 ng/L — ABNORMAL HIGH (ref <18)

## 2019-03-01 LAB — BRAIN NATRIURETIC PEPTIDE: B Natriuretic Peptide: 521 pg/mL — ABNORMAL HIGH (ref 0.0–100.0)

## 2019-03-01 MED ORDER — CARVEDILOL 25 MG PO TABS
25.0000 mg | ORAL_TABLET | Freq: Two times a day (BID) | ORAL | Status: DC
  Administered 2019-03-02 – 2019-03-03 (×3): 25 mg via ORAL
  Filled 2019-03-01: qty 2
  Filled 2019-03-01 (×2): qty 1

## 2019-03-01 MED ORDER — INSULIN ASPART 100 UNIT/ML ~~LOC~~ SOLN
0.0000 [IU] | Freq: Every day | SUBCUTANEOUS | Status: DC
  Administered 2019-03-01: 2 [IU] via SUBCUTANEOUS
  Administered 2019-03-02: 3 [IU] via SUBCUTANEOUS
  Filled 2019-03-01 (×2): qty 1

## 2019-03-01 MED ORDER — FUROSEMIDE 10 MG/ML IJ SOLN
40.0000 mg | Freq: Two times a day (BID) | INTRAMUSCULAR | Status: DC
  Administered 2019-03-01 – 2019-03-02 (×2): 40 mg via INTRAVENOUS
  Filled 2019-03-01 (×2): qty 4

## 2019-03-01 MED ORDER — INSULIN ASPART PROT & ASPART (70-30 MIX) 100 UNIT/ML ~~LOC~~ SUSP: 21.0000 [IU] | Freq: Two times a day (BID) | SUBCUTANEOUS | Status: DC

## 2019-03-01 MED ORDER — NITROGLYCERIN 2 % TD OINT
1.0000 [in_us] | TOPICAL_OINTMENT | Freq: Once | TRANSDERMAL | Status: AC
  Administered 2019-03-01: 12:00:00 1 [in_us] via TOPICAL
  Filled 2019-03-01: qty 1

## 2019-03-01 MED ORDER — LISINOPRIL 20 MG PO TABS
20.0000 mg | ORAL_TABLET | Freq: Every day | ORAL | Status: DC
  Administered 2019-03-03: 20 mg via ORAL
  Filled 2019-03-01: qty 1

## 2019-03-01 MED ORDER — ONDANSETRON HCL 4 MG PO TABS: 4.0000 mg | ORAL_TABLET | Freq: Four times a day (QID) | ORAL | Status: DC | PRN

## 2019-03-01 MED ORDER — BISACODYL 5 MG PO TBEC: 5.0000 mg | DELAYED_RELEASE_TABLET | Freq: Every day | ORAL | Status: DC | PRN

## 2019-03-01 MED ORDER — IPRATROPIUM-ALBUTEROL 0.5-2.5 (3) MG/3ML IN SOLN
3.0000 mL | RESPIRATORY_TRACT | Status: DC | PRN
  Administered 2019-03-01: 13:00:00 3 mL via RESPIRATORY_TRACT
  Filled 2019-03-01: qty 3

## 2019-03-01 MED ORDER — IPRATROPIUM-ALBUTEROL 0.5-2.5 (3) MG/3ML IN SOLN
3.0000 mL | Freq: Four times a day (QID) | RESPIRATORY_TRACT | Status: DC
  Administered 2019-03-01 – 2019-03-04 (×10): 3 mL via RESPIRATORY_TRACT
  Filled 2019-03-01 (×10): qty 3

## 2019-03-01 MED ORDER — ATORVASTATIN CALCIUM 20 MG PO TABS
10.0000 mg | ORAL_TABLET | Freq: Every day | ORAL | Status: DC
  Administered 2019-03-02 – 2019-03-04 (×3): 10 mg via ORAL
  Filled 2019-03-01 (×3): qty 1

## 2019-03-01 MED ORDER — HYDRALAZINE HCL 50 MG PO TABS
25.0000 mg | ORAL_TABLET | Freq: Three times a day (TID) | ORAL | Status: DC
  Administered 2019-03-02 – 2019-03-05 (×5): 25 mg via ORAL
  Filled 2019-03-01 (×5): qty 1

## 2019-03-01 MED ORDER — CHLORHEXIDINE GLUCONATE CLOTH 2 % EX PADS
6.0000 | MEDICATED_PAD | Freq: Every day | CUTANEOUS | Status: DC
  Administered 2019-03-01 – 2019-03-05 (×4): 6 via TOPICAL

## 2019-03-01 MED ORDER — HYDRALAZINE HCL 20 MG/ML IJ SOLN: 10.0000 mg | Freq: Four times a day (QID) | INTRAMUSCULAR | Status: DC | PRN

## 2019-03-01 MED ORDER — INSULIN ASPART 100 UNIT/ML ~~LOC~~ SOLN
0.0000 [IU] | Freq: Three times a day (TID) | SUBCUTANEOUS | Status: DC
  Administered 2019-03-01: 3 [IU] via SUBCUTANEOUS
  Administered 2019-03-02 (×2): 5 [IU] via SUBCUTANEOUS
  Administered 2019-03-02: 3 [IU] via SUBCUTANEOUS
  Administered 2019-03-03 (×3): 5 [IU] via SUBCUTANEOUS
  Administered 2019-03-04: 08:00:00 2 [IU] via SUBCUTANEOUS
  Administered 2019-03-04 (×2): 3 [IU] via SUBCUTANEOUS
  Administered 2019-03-05: 09:00:00 1 [IU] via SUBCUTANEOUS
  Administered 2019-03-05: 2 [IU] via SUBCUTANEOUS
  Filled 2019-03-01 (×12): qty 1

## 2019-03-01 MED ORDER — METHYLPREDNISOLONE SODIUM SUCC 125 MG IJ SOLR
125.0000 mg | Freq: Once | INTRAMUSCULAR | Status: AC
  Administered 2019-03-01: 13:00:00 125 mg via INTRAVENOUS
  Filled 2019-03-01: qty 2

## 2019-03-01 MED ORDER — VITAMIN K1 10 MG/ML IJ SOLN
5.0000 mg | Freq: Once | INTRAVENOUS | Status: AC
  Administered 2019-03-01: 5 mg via INTRAVENOUS
  Filled 2019-03-01: qty 0.5

## 2019-03-01 MED ORDER — ONDANSETRON HCL 4 MG/2ML IJ SOLN: 4.0000 mg | Freq: Four times a day (QID) | INTRAMUSCULAR | Status: DC | PRN

## 2019-03-01 MED ORDER — FUROSEMIDE 10 MG/ML IJ SOLN
80.0000 mg | Freq: Once | INTRAMUSCULAR | Status: AC
  Administered 2019-03-01: 80 mg via INTRAVENOUS
  Filled 2019-03-01: qty 8

## 2019-03-01 MED ORDER — ALBUTEROL SULFATE (2.5 MG/3ML) 0.083% IN NEBU: 2.5000 mg | INHALATION_SOLUTION | RESPIRATORY_TRACT | Status: DC | PRN

## 2019-03-01 MED ORDER — ACETAMINOPHEN 325 MG PO TABS: 650.0000 mg | ORAL_TABLET | Freq: Four times a day (QID) | ORAL | Status: DC | PRN

## 2019-03-01 MED ORDER — LISINOPRIL-HYDROCHLOROTHIAZIDE 20-12.5 MG PO TABS: 1.0000 | ORAL_TABLET | Freq: Every day | ORAL | Status: DC

## 2019-03-01 MED ORDER — SENNOSIDES-DOCUSATE SODIUM 8.6-50 MG PO TABS: 1.0000 | ORAL_TABLET | Freq: Every evening | ORAL | Status: DC | PRN

## 2019-03-01 MED ORDER — ASPIRIN EC 81 MG PO TBEC
81.0000 mg | DELAYED_RELEASE_TABLET | Freq: Every day | ORAL | Status: DC
  Administered 2019-03-03 – 2019-03-05 (×3): 81 mg via ORAL
  Filled 2019-03-01 (×3): qty 1

## 2019-03-01 MED ORDER — HYDROCHLOROTHIAZIDE 12.5 MG PO CAPS
12.5000 mg | ORAL_CAPSULE | Freq: Every day | ORAL | Status: DC
  Administered 2019-03-03: 09:00:00 12.5 mg via ORAL
  Filled 2019-03-01 (×3): qty 1

## 2019-03-01 MED ORDER — DILTIAZEM HCL ER COATED BEADS 240 MG PO CP24
240.0000 mg | ORAL_CAPSULE | Freq: Every day | ORAL | Status: DC
  Administered 2019-03-03 – 2019-03-05 (×3): 240 mg via ORAL
  Filled 2019-03-01 (×5): qty 1

## 2019-03-01 MED ORDER — PANTOPRAZOLE SODIUM 40 MG PO TBEC
40.0000 mg | DELAYED_RELEASE_TABLET | Freq: Every day | ORAL | Status: DC
  Administered 2019-03-03 – 2019-03-05 (×3): 40 mg via ORAL
  Filled 2019-03-01 (×3): qty 1

## 2019-03-01 MED ORDER — HYDROCODONE-ACETAMINOPHEN 5-325 MG PO TABS: 1.0000 | ORAL_TABLET | ORAL | Status: DC | PRN

## 2019-03-01 MED ORDER — ACETAMINOPHEN 650 MG RE SUPP: 650.0000 mg | Freq: Four times a day (QID) | RECTAL | Status: DC | PRN

## 2019-03-01 NOTE — Progress Notes (Signed)
Updated patient's daughter via the phone about patient's condition and transfer to ICU after clearing it with patient.

## 2019-03-01 NOTE — Progress Notes (Signed)
Transported to ICU on bipap without complications,report given to ICU therapist

## 2019-03-01 NOTE — ED Provider Notes (Signed)
[facility] Emergency Department Provider Note   ____________________________________________   First MD Initiated Contact with Patient 03/01/19 1107     (approximate)  I have reviewed the triage vital signs and the nursing notes.   HISTORY  Chief Complaint Shortness of Breath    HPI Kimberly Peterson is a 85 y.o. female with past medical history of diastolic CHF and COPD who presents to the ED complaining of shortness of breath.  Patient reports she was recently admitted to the hospital for CHF exacerbation and was feeling well when she was discharged.  She states she has been compliant with her medications since then, but has felt increasingly short of breath over the past 4 to 5 days.  She has also noticed increasing swelling in her lower extremities.  She denies any fevers, chills, chest pain, or cough.  She is not aware of any sick contacts.        Past Medical History:  Diagnosis Date  . CHF (congestive heart failure) ([place])   . Chronic kidney disease   . COPD (chronic obstructive pulmonary disease) ([place])   . Diabetes mellitus without complication ([place])   . Hypertension     Patient Active Problem List   Diagnosis Date Noted  . Acute and chronic respiratory failure with hypoxia ([employer]) 03/01/2019  . Acute respiratory failure with hypoxia ([place]) 02/07/2019  . Type 2 diabetes with peripheral circulatory disorder, controlled ([place]) 10/17/2018  . Complete heart block ([place]) 09/09/2018  . Severe aortic valve stenosis 09/09/2018  . Lymphedema 08/12/2018  . Uncontrolled type 2 diabetes mellitus with hyperglycemia ([place]) 02/25/2018  . Atrial fibrillation ([place]) 02/25/2018  . Encounter for current long-term use of anticoagulants 02/25/2018  . Dependence on supplemental oxygen 02/25/2018  . Lower extremity edema 02/25/2018  . CHF (congestive heart failure) ([place]) 12/04/2015  . Diabetes mellitus ([place]) 12/04/2015  . Chronic obstructive pulmonary disease ([place])  09/20/2015  . Diastolic CHF, acute on chronic (HCC) 09/19/2015  . Benign essential HTN 09/19/2015  . Controlled diabetes mellitus without complication, with long-term current use of insulin ([place]) 09/19/2015  . Hyperlipidemia, mixed 09/04/2014  . Pulmonary embolism ([place]) 09/23/2013    Past Surgical History:  Procedure Laterality Date  . aoric valve replacemet      st Jude  . CATARACT EXTRACTION, BILATERAL    . PACEMAKER PLACEMENT    . VALVE REPLACEMENT      Prior to Admission medications   Medication Sig Start Date End Date Taking? Authorizing Provider  aspirin 81 MG tablet Take 81 mg by mouth daily.   Yes [provider]  atorvastatin (LIPITOR) 10 MG tablet TAKE 1 TABLET BY MOUTH ONCE DAILY FOR CHOLESTEROL 12/06/18  Yes Kendell Bane, NP  carvedilol (COREG) 25 MG tablet Take 1 tablet by mouth twice daily 12/06/18  Yes [name], MD  diltiazem (CARDIZEM CD) 240 MG 24 hr capsule Take 240 mg by mouth daily.   Yes [provider]  furosemide (LASIX) 40 MG tablet TAKE 1 TABLET BY MOUTH ONCE DAILY FOR  FLUID  MAY  TAKE  1/2  TABLET  EXTRA  IF  NEEDED 02/22/19  Yes Scar[place], Audie Clear, NP  hydrALAZINE (APRESOLINE) 25 MG tablet TAKE 1 & 1/2 (ONE & ONE-HALF) TABLETS BY MOUTH THREE TIMES DAILY FOR BLOOD PRESSURE 02/22/19  Yes [place], Audie Clear, NP  insulin aspart protamine- aspart (NOVOLOG MIX 70/30) (70-30) 100 UNIT/ML injection Inject 21-34 Units into the skin 2 (two) times daily with a meal.  34 units in morning and 21 units in the evening   Yes [provider]  lisinopril-hydrochlorothiazide (ZESTORETIC) 20-12.5 MG tablet Take 1 tablet by mouth daily.   Yes [provider]  pantoprazole (PROTONIX) 40 MG tablet Take 40 mg by mouth daily. 06/09/18  Yes [provider]  warfarin (COUMADIN) 5 MG tablet TAKE ONE TABLET BY MOUTH ONCE DAILY AT 5-6PM AND TAKE ONE AND ONE-HALF TABLET BY MOUTH ON THURSDAY 02/22/19  Yes Ronnell Freshwater, NP    Allergies Patient  has no known allergies.  History reviewed. No pertinent family history.  Social History Social History   Tobacco Use  . Smoking status: Never Smoker  . Smokeless tobacco: Never Used  Substance Use Topics  . Alcohol use: No  . Drug use: No    Review of Systems  Constitutional: No fever/chills Eyes: No visual changes. ENT: No sore throat. Cardiovascular: Denies chest pain. Respiratory: Positive for shortness of breath. Gastrointestinal: No abdominal pain.  No nausea, no vomiting.  No diarrhea.  No constipation. Genitourinary: Negative for dysuria. Musculoskeletal: Negative for back pain. Skin: Negative for rash. Neurological: Negative for headaches, focal weakness or numbness.  ____________________________________________   PHYSICAL EXAM:  VITAL SIGNS: ED Triage Vitals  Enc Vitals Group     BP 03/01/19 1101 (!) 221/87     Pulse Rate 03/01/19 1101 88     Resp 03/01/19 1101 (!) 26     Temp 03/01/19 1101 99 F (37.2 C)     Temp Source 03/01/19 1101 Axillary     SpO2 03/01/19 1101 100 %     Weight 03/01/19 1105 200 lb (90.7 kg)     Height 03/01/19 1105 5\' 2"  (1.575 m)     Head Circumference --      Peak Flow --      Pain Score 03/01/19 1102 0     Pain Loc --      Pain Edu? --      Excl. in [place]? --     Constitutional: Alert and oriented. Eyes: Conjunctivae are normal. Head: Atraumatic. Nose: No congestion/rhinnorhea. Mouth/Throat: Mucous membranes are moist. Neck: Normal ROM Cardiovascular: Normal rate, regular rhythm. Grossly normal heart sounds. Respiratory: Increased respiratory effort with abdominal accessory muscle use, crackles to bilateral bases. Gastrointestinal: Soft and nontender. No distention. Genitourinary: deferred Musculoskeletal: No lower extremity tenderness, 2+ pitting edema to bilateral lower extremities. Neurologic:  Normal speech and language. No gross focal neurologic deficits are appreciated. Skin:  Skin is warm, dry and intact. No rash  noted. Psychiatric: Mood and affect are normal. Speech and behavior are normal.  ____________________________________________   LABS (all labs ordered are listed, but only abnormal results are displayed)  Labs Reviewed  BASIC METABOLIC PANEL - Abnormal; Notable for the following components:      Result Value   Glucose, Bld 145 (*)    GFR calc non Af Amer 57 (*)    All other components within normal limits  CBC WITH DIFFERENTIAL/PLATELET - Abnormal; Notable for the following components:   Hemoglobin 10.7 (*)    MCH 25.3 (*)    MCHC 29.4 (*)    RDW 15.7 (*)    All other components within normal limits  BRAIN NATRIURETIC PEPTIDE - Abnormal; Notable for the following components:   B Natriuretic Peptide 521.0 (*)    All other components within normal limits  APTT - Abnormal; Notable for the following components:   aPTT 51 (*)    All other components within normal  limits  PROTIME-INR - Abnormal; Notable for the following components:   Prothrombin Time 47.4 (*)    INR 5.3 (*)    All other components within normal limits  TROPONIN I (HIGH SENSITIVITY) - Abnormal; Notable for the following components:   Troponin I (High Sensitivity) 33 (*)    All other components within normal limits  TROPONIN I (HIGH SENSITIVITY) - Abnormal; Notable for the following components:   Troponin I (High Sensitivity) 67 (*)    All other components within normal limits  SARS CORONAVIRUS 2  MRSA PCR SCREENING   ____________________________________________  EKG  ED ECG REPORT I, Blake Divine, the attending physician, personally viewed and interpreted this ECG.   Date: 03/01/2019  EKG Time: 11:00  Rate: 89  Rhythm: normal sinus rhythm  Axis: Normal  Intervals:nonspecific intraventricular conduction delay  ST&T Change: None    PROCEDURES  Procedure(s) performed (including Critical Care):  Procedures   ____________________________________________   INITIAL IMPRESSION / ASSESSMENT AND  PLAN / ED COURSE       85 year old female with history of COPD and CHF presents to the ED with worsening shortness of breath and leg swelling.  Significant hypertension noted upon arrival and given clinically apparent CHF exacerbation, Nitropaste placed.  Patient initially appeared comfortable on 4 L nasal cannula, but then reported increasing difficulty breathing despite maintaining O2 sats.  She was subsequently transitioned to BiPAP and reported feeling better.  Chest x-ray and elevated BNP consistent with CHF exacerbation, will treat with Lasix.  No apparent infectious process noted on chest x-ray.  Patient noted to have elevated troponin, however this is similar to prior no ischemic changes on EKG, lower suspicion for ACS.  Patient not noted to have significant wheezing on exam, but given her history we will also treat for COPD exacerbation with duo nebs and steroids.  Case discussed with hospitalist, who accepts patient for admission.      ____________________________________________   FINAL CLINICAL IMPRESSION(S) / ED DIAGNOSES  Final diagnoses:  Acute on chronic congestive heart failure, unspecified heart failure type ([place])  Shortness of breath     ED Discharge Orders    None       Note:  This document was prepared using Dragon voice recognition software and may include unintentional dictation errors.   Blake Divine, MD 03/01/19 [phone]

## 2019-03-01 NOTE — Consult Note (Signed)
CRITICAL CARE NOTE      CHIEF COMPLAINT:   Acute hypoxemic respiratory failure   History of present illness   This is a pleasant 85 year old female with a history of CHF, CKD COPD diabetes essential hypertension who came in to emergency department with complaints of shortness of breath.  Patient recently had admission for acute decompensated CHF and was treated for 4 days.  She has noticed worsening lower extremity swelling.  She denies having any lower respiratory tract symptoms or sick contacts.  She denies having night sweats chills chest pain or fevers.  Patient did have an x-ray which showed bilateral pulmonary infiltrates suggestive of edema.  She had blood work done including CBC showing chronic normocytic anemia without leukocytosis, and a BMP with mild hyperglycemia and mild decrement in GFR at 57 which is an improvement from her previous at 44 just 2 weeks ago.  Her high sensitivity troponin was only mildly elevated but the beta natruretic peptide was significantly elevated in the context of morbid obesity which is likely much higher in reality.  Her baseline beta natruretic natruretic peptide is closer to 150.  She did have COVID screening done on 02/07/2019 which was negative and a repeat COVID testing is pending due to unavailability of rapid Cepheid COVID test.  PAST MEDICAL HISTORY   Past Medical History:  Diagnosis Date  . CHF (congestive heart failure) ([place])   . Chronic kidney disease   . COPD (chronic obstructive pulmonary disease) ([place])   . Diabetes mellitus without complication ([place])   . Hypertension      SURGICAL HISTORY   Past Surgical History:  Procedure Laterality Date  . aoric valve replacemet      st Jude  . CATARACT EXTRACTION, BILATERAL    . PACEMAKER PLACEMENT    . VALVE REPLACEMENT        FAMILY HISTORY   History reviewed. No pertinent family history.   SOCIAL HISTORY   Social History   Tobacco Use  . Smoking status: Never Smoker  . Smokeless tobacco: Never Used  Substance Use Topics  . Alcohol use: No  . Drug use: No     MEDICATIONS   Current Medication:  Current Facility-Administered Medications:  .  acetaminophen (TYLENOL) tablet 650 mg, 650 mg, Oral, Q6H PRN **OR** acetaminophen (TYLENOL) suppository 650 mg, 650 mg, Rectal, Q6H PRN, Demetrios Loll, MD .  albuterol (PROVENTIL) (2.5 MG/3ML) 0.083% nebulizer solution 2.5 mg, 2.5 mg, Nebulization, Q2H PRN, Demetrios Loll, MD .  aspirin EC tablet 81 mg, 81 mg, Oral, Daily, Demetrios Loll, MD .  atorvastatin (LIPITOR) tablet 10 mg, 10 mg, Oral, q1800, Demetrios Loll, MD .  bisacodyl (DULCOLAX) EC tablet 5 mg, 5 mg, Oral, Daily PRN, Demetrios Loll, MD .  carvedilol (COREG) tablet 25 mg, 25 mg, Oral, BID WC, Demetrios Loll, MD .  Chlorhexidine Gluconate Cloth 2 % PADS 6 each, 6 each, Topical, Daily, Ottie Glazier, MD .  diltiazem (CARDIZEM CD) 24 hr capsule 240 mg, 240 mg, Oral, Daily, Demetrios Loll, MD .  furosemide (LASIX) injection 40 mg, 40 mg, Intravenous, Q12H, Demetrios Loll, MD .  hydrALAZINE (APRESOLINE) injection 10 mg, 10 mg, Intravenous, Q6H PRN, Demetrios Loll, MD .  hydrALAZINE (APRESOLINE) tablet 25 mg, 25 mg, Oral, Q8H, Demetrios Loll, MD .  lisinopril (ZESTRIL) tablet 20 mg, 20 mg, Oral, Daily **AND** hydrochlorothiazide (MICROZIDE) capsule 12.5 mg, 12.5 mg, Oral, Daily, Demetrios Loll, MD .  HYDROcodone-acetaminophen (NORCO/VICODIN) 5-325 MG per tablet 1-2 tablet, 1-2 tablet, Oral, Q4H  PRN, Demetrios Loll, MD .  insulin aspart (novoLOG) injection 0-5 Units, 0-5 Units, Subcutaneous, QHS, Demetrios Loll, MD .  insulin aspart (novoLOG) injection 0-9 Units, 0-9 Units, Subcutaneous, TID WC, Demetrios Loll, MD .  insulin aspart protamine- aspart (NOVOLOG MIX 70/30) injection 21-34 Units, 21-34 Units, Subcutaneous, BID WC, Demetrios Loll, MD .   ipratropium-albuterol (DUONEB) 0.5-2.5 (3) MG/3ML nebulizer solution 3 mL, 3 mL, Nebulization, Q15 min PRN, Blake Divine, MD, 3 mL at 03/01/19 1258 .  ipratropium-albuterol (DUONEB) 0.5-2.5 (3) MG/3ML nebulizer solution 3 mL, 3 mL, Nebulization, Q6H, Demetrios Loll, MD .  ondansetron [facility]) tablet 4 mg, 4 mg, Oral, Q6H PRN **OR** ondansetron (ZOFRAN) injection 4 mg, 4 mg, Intravenous, Q6H PRN, Demetrios Loll, MD .  pantoprazole (PROTONIX) EC tablet 40 mg, 40 mg, Oral, Daily, Demetrios Loll, MD .  phytonadione (VITAMIN K) 5 mg in dextrose 5 % 50 mL IVPB, 5 mg, Intravenous, Once, Ottie Glazier, MD .  senna-docusate (Senokot-S) tablet 1 tablet, 1 tablet, Oral, QHS PRN, Demetrios Loll, MD    ALLERGIES   Patient has no known allergies.    REVIEW OF SYSTEMS     10 point ROS conducted and is negative except for mild hypothermia reported by patient  PHYSICAL EXAMINATION   Vitals:   03/01/19 1441 03/01/19 1520  BP:  (!) 185/88  Pulse:  84  Resp:  (!) 26  Temp:    SpO2: 94% 93%    GENERAL mild distress due to shortness of breath HEAD: Normocephalic, atraumatic.  EYES: Pupils equal, round, reactive to light.  No scleral icterus.  MOUTH: Moist mucosal membrane. NECK: Supple. No thyromegaly. No nodules. No JVD.  PULMONARY: Decreased breath sounds bilaterally with crackles at the bases CARDIOVASCULAR: S1 and S2. Regular rate and rhythm. No murmurs, rubs, or gallops.  GASTROINTESTINAL: Soft, nontender, non-distended. No masses. Positive bowel sounds. No hepatosplenomegaly.  MUSCULOSKELETAL: No swelling, clubbing, or edema.  NEUROLOGIC: Mild distress due to acute illness SKIN:intact,warm,dry   PERTINENT DATA      Lines / Drains: Peripheral line  Cultures / Sepsis markers: Respiratory and blood  Antibiotics: Currently no antibiotics   Protocols / Consultants: None  Tests / Events: Transthoracic echo  Overnight: Patient will remain on BiPAP      Infusions: . phytonadione  (VITAMIN K) IV     Scheduled Medications: . aspirin EC  81 mg Oral Daily  . atorvastatin  10 mg Oral q1800  . carvedilol  25 mg Oral BID WC  . Chlorhexidine Gluconate Cloth  6 each Topical Daily  . diltiazem  240 mg Oral Daily  . furosemide  40 mg Intravenous Q12H  . hydrALAZINE  25 mg Oral Q8H  . lisinopril  20 mg Oral Daily   And  . hydrochlorothiazide  12.5 mg Oral Daily  . insulin aspart  0-5 Units Subcutaneous QHS  . insulin aspart  0-9 Units Subcutaneous TID WC  . insulin aspart protamine- aspart  21-34 Units Subcutaneous BID WC  . ipratropium-albuterol  3 mL Nebulization Q6H  . pantoprazole  40 mg Oral Daily   PRN Medications: acetaminophen **OR** acetaminophen, albuterol, bisacodyl, hydrALAZINE, HYDROcodone-acetaminophen, ipratropium-albuterol, ondansetron **OR** ondansetron (ZOFRAN) IV, senna-docusate Hemodynamic parameters:   Intake/Output: No intake/output data recorded.  Ventilator  Settings:        LAB RESULTS:  Basic Metabolic Panel: Recent Labs  Lab 03/01/19 1109  NA 140  K 4.1  CL 103  CO2 28  GLUCOSE 145*  BUN 17  CREATININE 0.91  CALCIUM 9.0  Liver Function Tests: No results for input(s): AST, ALT, ALKPHOS, BILITOT, PROT, ALBUMIN in the last 168 hours. No results for input(s): LIPASE, AMYLASE in the last 168 hours. No results for input(s): AMMONIA in the last 168 hours. CBC: Recent Labs  Lab 03/01/19 1109  WBC 7.0  NEUTROABS 4.9  HGB 10.7*  HCT 36.4  MCV 86.1  PLT 323   Cardiac Enzymes: No results for input(s): CKTOTAL, CKMB, CKMBINDEX, TROPONINI in the last 168 hours. BNP: Invalid input(s): POCBNP CBG: No results for input(s): GLUCAP in the last 168 hours.   IMAGING RESULTS:  Imaging: Dg Chest Portable 1 View  Result Date: 03/01/2019 CLINICAL DATA:  Shortness of breath, history CHF, COPD, diabetes mellitus, hypertension EXAM: PORTABLE CHEST 1 VIEW COMPARISON:  Portable exam 1106 hours compared to 02/07/2019 FINDINGS: LEFT  subclavian sequential transvenous pacemaker leads project at RIGHT atrium and RIGHT ventricle. Enlargement of cardiac silhouette with pulmonary vascular congestion. Atherosclerotic calcification aorta mild tortuosity. Scattered interstitial infiltrates consistent with mild pulmonary edema. No pleural effusion or gross pneumothorax, though assessment of the lung apices is limited by superimposition of the patient's chin. IMPRESSION: Mild CHF. Electronically Signed   By: Lavonia Dana M.D.   On: 03/01/2019 11:55      ASSESSMENT AND PLAN    -Multidisciplinary rounds held today  Acute Hypoxic Respiratory Failure -Due to acute decompensated diastolic CHF EF 60 to 123456 -continue Bronchodilator Therapy -Continue BiPAP -We will perform ABG if patient does not clinically improved by tomorrow morning   Acute decompensated diastolic CHF with EF 60 to 65% -oxygen as needed -Lasix 60 mg IV twice daily -1200 cc fluid restriction per 24 hours -Continue NIV support ICU monitoring  Renal Failure-chronic stage I -follow chem 7 -follow UO -continue Foley Catheter-assess need daily    ID -continue IV abx as prescibed -follow up cultures  GI/Nutrition GI PROPHYLAXIS as indicated DIET-->TF's as tolerated Constipation protocol as indicated  ENDO - ICU hypoglycemic\Hyperglycemia protocol -check FSBS per protocol   ELECTROLYTES -follow labs as needed -replace as needed -pharmacy consultation   DVT/GI PRX ordered -SCDs  TRANSFUSIONS AS NEEDED MONITOR FSBS ASSESS the need for LABS as needed   Critical care provider statement:    Critical care time (minutes):  109   Critical care time was exclusive of:  Separately billable procedures and treating other patients   Critical care was necessary to treat or prevent imminent or life-threatening deterioration of the following conditions:   Acute hypoxemic respiratory failure, acute decompensated diastolic heart failure, obstructive sleep apnea,  diabetes, essential hypertension, morbid obesity, multiple comorbid conditions   Critical care was time spent personally by me on the following activities:  Development of treatment plan with patient or surrogate, discussions with consultants, evaluation of patient's response to treatment, examination of patient, obtaining history from patient or surrogate, ordering and performing treatments and interventions, ordering and review of laboratory studies and re-evaluation of patient's condition.  I assumed direction of critical care for this patient from another provider in my specialty: no    This document was prepared using Dragon voice recognition software and may include unintentional dictation errors.    Ottie Glazier, M.D.  Division of [place]

## 2019-03-01 NOTE — ED Notes (Signed)
ED TO INPATIENT HANDOFF REPORT  ED Nurse Name and Phone #: [place] [employer] and Caryl Pina, [place] Name/Age/Gender Kimberly Peterson 85 y.o. female Room/Bed: ED06A/ED06A  Code Status   Code Status: Prior  Home/SNF/Other Home Patient oriented to: self, place, time and situation Is this baseline? Yes   Triage Complete: Triage complete  Chief Complaint sob  Triage Note pt from home, sob increase in sweling in lower legs. EMS reports pt seen a couple weeks ago and admitted for the same. chronicly on 2L [place] at home. increased to 4L face mask saturatinon 100%. pt continues to states she cant breath with [place], so ems placed pt on a non rebreather at 10L for comfort. Pt has some minor labored breathing at this time  ems vitals 97.7 182/98 98 HR afib CBG 139   Allergies No Known Allergies  Level of Care/Admitting Diagnosis ED Disposition    ED Disposition Condition [facility]: [place] [[id number]]  Level of Care: Stepdown [14]  Covid Evaluation: Person Under Investigation (PUI)  Diagnosis: Acute and chronic respiratory failure with hypoxia [id number]  Admitting Physician: Demetrios Loll [phone]  Attending Physician: Demetrios Loll [phone]  Estimated length of stay: 3 - 4 days  Certification:: I certify this patient will need inpatient services for at least 2 midnights  PT Class (Do Not Modify): Inpatient [101]  PT Acc Code (Do Not Modify): Private [1]       B Medical/Surgery History Past Medical History:  Diagnosis Date  . CHF (congestive heart failure) ([place])   . Chronic kidney disease   . COPD (chronic obstructive pulmonary disease) ([place])   . Diabetes mellitus without complication ([place])   . Hypertension    Past Surgical History:  Procedure Laterality Date  . aoric valve replacemet      st Jude  . CATARACT EXTRACTION, BILATERAL    . PACEMAKER PLACEMENT    . VALVE REPLACEMENT       A IV Location/Drains/Wounds Patient  Lines/Drains/Airways Status   Active Line/Drains/Airways    Name:   Placement date:   Placement time:   Site:   Days:   Peripheral IV 03/01/19 Right Antecubital   03/01/19    1240    Antecubital   less than 1   External Urinary Catheter   03/01/19    1252    -   less than 1          Intake/Output Last 24 hours No intake or output data in the 24 hours ending 03/01/19 1356  Labs/Imaging Results for orders placed or performed during the hospital encounter of 03/01/19 (from the past 48 hour(s))  Basic metabolic panel     Status: Abnormal   Collection Time: 03/01/19 11:09 AM  Result Value Ref Range   Sodium 140 135 - 145 mmol/L   Potassium 4.1 3.5 - 5.1 mmol/L   Chloride 103 98 - 111 mmol/L   CO2 28 22 - 32 mmol/L   Glucose, Bld 145 (H) 70 - 99 mg/dL   BUN 17 8 - 23 mg/dL   Creatinine, Ser 0.91 0.44 - 1.00 mg/dL   Calcium 9.0 8.9 - 10.3 mg/dL   GFR calc non Af Amer 57 (L) >60 mL/min   GFR calc Af Amer >60 >60 mL/min   Anion gap 9 5 - 15    Comment: Performed at [facility], [address], [place], [place] [address]  CBC with Differential     Status:  Abnormal   Collection Time: 03/01/19 11:09 AM  Result Value Ref Range   WBC 7.0 4.0 - 10.5 K/uL   RBC 4.23 3.87 - 5.11 MIL/uL   Hemoglobin 10.7 (L) 12.0 - 15.0 g/dL   HCT 36.4 36.0 - 46.0 %   MCV 86.1 80.0 - 100.0 fL   MCH 25.3 (L) 26.0 - 34.0 pg   MCHC 29.4 (L) 30.0 - 36.0 g/dL   RDW 15.7 (H) 11.5 - 15.5 %   Platelets 323 150 - 400 K/uL   nRBC 0.0 0.0 - 0.2 %   Neutrophils Relative % 70 %   Neutro Abs 4.9 1.7 - 7.7 K/uL   Lymphocytes Relative 17 %   Lymphs Abs 1.2 0.7 - 4.0 K/uL   Monocytes Relative 10 %   Monocytes Absolute 0.7 0.1 - 1.0 K/uL   Eosinophils Relative 2 %   Eosinophils Absolute 0.1 0.0 - 0.5 K/uL   Basophils Relative 1 %   Basophils Absolute 0.0 0.0 - 0.1 K/uL   Immature Granulocytes 0 %   Abs Immature Granulocytes 0.02 0.00 - 0.07 K/uL    Comment: Performed at [facility], [place], [place] [address]  Troponin I (High Sensitivity)     Status: Abnormal   Collection Time: 03/01/19 11:09 AM  Result Value Ref Range   Troponin I (High Sensitivity) 33 (H) <18 ng/L    Comment: (NOTE) Elevated high sensitivity troponin I (hsTnI) values and significant  changes across serial measurements may suggest ACS but many other  chronic and acute conditions are known to elevate hsTnI results.  Refer to the "Links" section for chest pain algorithms and additional  guidance. Performed at [facility], [address] [place], [place] [address]   Brain natriuretic peptide     Status: Abnormal   Collection Time: 03/01/19 11:09 AM  Result Value Ref Range   B Natriuretic Peptide 521.0 (H) 0.0 - 100.0 pg/mL    Comment: Performed at [facility], [address] [place], [place] [address]   Dg Chest Portable 1 View  Result Date: 03/01/2019 CLINICAL DATA:  Shortness of breath, history CHF, COPD, diabetes mellitus, hypertension EXAM: PORTABLE CHEST 1 VIEW COMPARISON:  Portable exam 1106 hours compared to 02/07/2019 FINDINGS: LEFT subclavian sequential transvenous pacemaker leads project at RIGHT atrium and RIGHT ventricle. Enlargement of cardiac silhouette with pulmonary vascular congestion. Atherosclerotic calcification aorta mild tortuosity. Scattered interstitial infiltrates consistent with mild pulmonary edema. No pleural effusion or gross pneumothorax, though assessment of the lung apices is limited by superimposition of the patient's chin. IMPRESSION: Mild CHF. Electronically Signed   By: Lavonia Dana M.D.   On: 03/01/2019 11:55    Pending Labs Unresulted Labs (From admission, onward)    Start     Ordered   03/01/19 1152  SARS CORONAVIRUS 2 (TAT 6-12 HRS)  Once,   R     03/01/19 1152   Signed and Held  Basic metabolic panel  Tomorrow morning,   R     Signed and Held   Signed and Held  CBC  Tomorrow morning,   R     Signed and Held   Signed and  Held  Basic metabolic panel  Daily,   R     Signed and Held          Vitals/Pain Today's Vitals   03/01/19 1145 03/01/19 1245 03/01/19 1300 03/01/19 1315  BP: (!) 196/89 (!) 203/99 (!) 201/79 (!) 190/85  Pulse: 88  97 91 90  Resp: (!) 33 (!) 34 (!) 35 (!) 35  Temp:      TempSrc:      SpO2: 95% 95% 91% 90%  Weight:      Height:      PainSc:        Isolation Precautions Airborne and Contact precautions  Medications Medications  ipratropium-albuterol (DUONEB) 0.5-2.5 (3) MG/3ML nebulizer solution 3 mL (3 mLs Nebulization Given 03/01/19 1258)  nitroGLYCERIN (NITROGLYN) 2 % ointment 1 inch (1 inch Topical Given 03/01/19 1135)  furosemide (LASIX) injection 80 mg (80 mg Intravenous Given 03/01/19 1246)  methylPREDNISolone sodium succinate (SOLU-MEDROL) 125 mg/2 mL injection 125 mg (125 mg Intravenous Given 03/01/19 1258)    Mobility walks with device Low fall risk   Focused Assessments Pulmonary Assessment Handoff:  Lung sounds: Bilateral Breath Sounds: Diminished O2 Device: NRB O2 Flow Rate (L/min): 10 L/min      R Recommendations: See Admitting Provider Note  Report given to:   Additional Notes: pt is currently on BiPap

## 2019-03-01 NOTE — ED Notes (Signed)
Date and time results received: 03/01/19 1407  Test: Troponin Critical Value: 67  Name of Provider Notified: Dr. Charna Archer, MD

## 2019-03-01 NOTE — ED Notes (Signed)
Pt placed on BiPap due to increased SOB and labored breathing

## 2019-03-01 NOTE — Consult Note (Signed)
ANTICOAGULATION CONSULT NOTE - Initial Consult  Pharmacy Consult for Warfarin Dosing Indication: atrial fibrillation  No Known Allergies  Patient Measurements: Height: 5\' 2"  (157.5 cm) Weight: 200 lb (90.7 kg) IBW/kg (Calculated) : 50.1 Heparin Dosing Weight: 71.1 kg  Vital Signs: Temp: 99 F (37.2 C) (08/25 1101) Temp Source: Axillary (08/25 1101) BP: 167/68 (08/25 1700) Pulse Rate: 75 (08/25 1700)  Labs: Recent Labs    03/01/19 1109 03/01/19 1323  HGB 10.7*  --   HCT 36.4  --   PLT 323  --   APTT 51*  --   LABPROT 47.4*  --   INR 5.3*  --   CREATININE 0.91  --   TROPONINIHS 33* 67*    Estimated Creatinine Clearance: 45.6 mL/min (by C-G formula based on SCr of 0.91 mg/dL).   Medical History: Past Medical History:  Diagnosis Date  . CHF (congestive heart failure) ([place])   . Chronic kidney disease   . COPD (chronic obstructive pulmonary disease) ([place])   . Diabetes mellitus without complication ([place])   . Hypertension     Medications:  Medications Prior to Admission  Medication Sig Dispense Refill Last Dose  . aspirin 81 MG tablet Take 81 mg by mouth daily.   02/28/2019 at Unknown time  . atorvastatin (LIPITOR) 10 MG tablet TAKE 1 TABLET BY MOUTH ONCE DAILY FOR CHOLESTEROL 90 tablet 3 02/28/2019 at Unknown time  . carvedilol (COREG) 25 MG tablet Take 1 tablet by mouth twice daily 180 tablet 0 02/28/2019 at Unknown time  . diltiazem (CARDIZEM CD) 240 MG 24 hr capsule Take 240 mg by mouth daily.   02/28/2019 at Unknown time  . furosemide (LASIX) 40 MG tablet TAKE 1 TABLET BY MOUTH ONCE DAILY FOR  FLUID  MAY  TAKE  1/2  TABLET  EXTRA  IF  NEEDED 120 tablet 0 02/28/2019 at Unknown time  . hydrALAZINE (APRESOLINE) 25 MG tablet TAKE 1 & 1/2 (ONE & ONE-HALF) TABLETS BY MOUTH THREE TIMES DAILY FOR BLOOD PRESSURE 405 tablet 0 02/28/2019 at Unknown time  . insulin aspart protamine- aspart (NOVOLOG MIX 70/30) (70-30) 100 UNIT/ML injection Inject 21-34 Units into the skin 2 (two)  times daily with a meal. 34 units in morning and 21 units in the evening   02/28/2019 at Unknown time  . lisinopril-hydrochlorothiazide (ZESTORETIC) 20-12.5 MG tablet Take 1 tablet by mouth daily.   02/28/2019 at Unknown time  . pantoprazole (PROTONIX) 40 MG tablet Take 40 mg by mouth daily.   02/28/2019 at Unknown time  . warfarin (COUMADIN) 5 MG tablet TAKE ONE TABLET BY MOUTH ONCE DAILY AT 5-6PM AND TAKE ONE AND ONE-HALF TABLET BY MOUTH ON THURSDAY 90 tablet 4 Past Week at Unknown time   Scheduled:  . aspirin EC  81 mg Oral Daily  . atorvastatin  10 mg Oral q1800  . carvedilol  25 mg Oral BID WC  . Chlorhexidine Gluconate Cloth  6 each Topical Daily  . diltiazem  240 mg Oral Daily  . furosemide  40 mg Intravenous Q12H  . hydrALAZINE  25 mg Oral Q8H  . lisinopril  20 mg Oral Daily   And  . hydrochlorothiazide  12.5 mg Oral Daily  . insulin aspart  0-5 Units Subcutaneous QHS  . insulin aspart  0-9 Units Subcutaneous TID WC  . insulin aspart protamine- aspart  21-34 Units Subcutaneous BID WC  . ipratropium-albuterol  3 mL Nebulization Q6H  . pantoprazole  40 mg Oral Daily   Infusions:  .  phytonadione (VITAMIN K) IV 5 mg (03/01/19 1744)   PRN: acetaminophen **OR** acetaminophen, albuterol, bisacodyl, hydrALAZINE, HYDROcodone-acetaminophen, ipratropium-albuterol, ondansetron **OR** ondansetron (ZOFRAN) IV, senna-docusate Anti-infectives (From admission, onward)   None      Assessment: Pharmacy has been consulted for Warfarin therapy in 85yo patient admitted with acute and chronic respiratory failure with hypoxia. Patient takes Warfarin 5mg  everyday but Thursday, when he takes 7.5mg . Patient has stated that he took his dose yesterday but nothing today.   8/25@1109 : INR 5.3  Goal of Therapy:  INR 2-3 Monitor platelets by anticoagulation protocol: Yes   Plan:  Supratherapeutic but shows no signs of bleeding observed and Hgb 10.7 currently. Intensivist has order Vitamin K 5mg  IV x 1  dose started@1744  to decrease INR. Will hold off on Warfarin dose this evening until stable.  Will recheck INR with AM labs.  Wylie Coon A Aamya Orellana 03/01/2019,5:54 PM

## 2019-03-01 NOTE — Progress Notes (Signed)
Advanced Care Plan.  Purpose of Encounter: CODE STATUS. Parties in Attendance: The patient and me. Patient's Decisional Capacity: Yes. Medical Story: Kimberly Peterson  is a 85 y.o. female with a known history of chronic respiratory failure on home oxygen 2 L, CHF, COPD, CKD, hypertension, diabetes.    The patient is being admitted for acute on chronic respiratory failure with hypoxia due to acute on chronic diastolic CHF.  She is on BiPAP.  I discussed with the patient about her current condition, prognosis and CODE STATUS.  The patient does want to be resuscitated and intubated if she has cardiopulmonary arrest.  Plan: Palliative care consult. Code Status: Full code for now. Time spent discussing advance care planning: 18 minutes.

## 2019-03-01 NOTE — Progress Notes (Signed)
CRITICAL VALUE ALERT  Critical Value:  INR 5.3  Date & Time Notified: 03/01/2019 15:30  Provider Notified: Dr. Lanney Gins  Orders Received/Actions taken: MD reviewed patient's chart with RN present. Pharmacy consult already in place to dose wafarin, Hgb 10.7 with no signs of bleeding observed on admission. MD ordered 5 mg of Vitamin K IVPB once.

## 2019-03-01 NOTE — ED Triage Notes (Signed)
pt from home, sob increase in sweling in lower legs. EMS reports pt seen a couple weeks ago and admitted for the same. chronicly on 2L [place] at home. increased to 4L face mask saturatinon 100%. pt continues to states she cant breath with [place], so ems placed pt on a non rebreather at 10L for comfort. Pt has some minor labored breathing at this time  ems vitals 97.7 182/98 98 HR afib CBG 139

## 2019-03-01 NOTE — H&P (Signed)
[place] [facility] [place] NAME: Kimberly Peterson    MR#:  [id number]  DATE OF BIRTH:  15-Feb-1930  DATE OF ADMISSION:  03/01/2019  PRIMARY CARE PHYSICIAN: [name], MD   REQUESTING/REFERRING PHYSICIAN: Blake Divine, MD  CHIEF COMPLAINT:   Chief Complaint  Patient presents with  . Shortness of Breath   Shortness of breath for 3 to 4 days. HISTORY OF PRESENT ILLNESS:  Kimberly Peterson  is a 85 y.o. female with a known history of chronic respiratory failure on home oxygen 2 L, CHF, COPD, CKD, hypertension diabetes.  The patient presented to ED with above chief complaints.  She has had worsening shortness of breath and a cough for the past 3 to 4 days.  She denies any fever or chills, no orthopnea or nocturnal dyspnea but has leg edema.  She is found hypoxia in the ED and put on BiPAP.  Chest x-ray show CHF congestion.  She is treated with Lasix.  ED physician request admission. PAST MEDICAL HISTORY:   Past Medical History:  Diagnosis Date  . CHF (congestive heart failure) ([place])   . Chronic kidney disease   . COPD (chronic obstructive pulmonary disease) ([place])   . Diabetes mellitus without complication ([place])   . Hypertension     PAST SURGICAL HISTORY:   Past Surgical History:  Procedure Laterality Date  . aoric valve replacemet      st Jude  . CATARACT EXTRACTION, BILATERAL    . PACEMAKER PLACEMENT    . VALVE REPLACEMENT      SOCIAL HISTORY:   Social History   Tobacco Use  . Smoking status: Never Smoker  . Smokeless tobacco: Never Used  Substance Use Topics  . Alcohol use: No    FAMILY HISTORY:  History reviewed. No pertinent family history.  DRUG ALLERGIES:  No Known Allergies  REVIEW OF SYSTEMS:   Review of Systems  Constitutional: Positive for malaise/fatigue. Negative for chills and fever.  HENT: Negative for sore throat.   Eyes: Negative for blurred vision and double vision.  Respiratory: Positive for cough,  sputum production and shortness of breath. Negative for hemoptysis, wheezing and stridor.   Cardiovascular: Negative for chest pain, palpitations, orthopnea and leg swelling.  Gastrointestinal: Negative for abdominal pain, blood in stool, diarrhea, melena, nausea and vomiting.  Genitourinary: Negative for dysuria, flank pain and hematuria.  Musculoskeletal: Negative for back pain and joint pain.  Skin: Negative for rash.  Neurological: Negative for dizziness, sensory change, focal weakness, seizures, loss of consciousness, weakness and headaches.  Endo/Heme/Allergies: Negative for polydipsia.  Psychiatric/Behavioral: Negative for depression. The patient is not nervous/anxious.     MEDICATIONS AT HOME:   Prior to Admission medications   Medication Sig Start Date End Date Taking? Authorizing Provider  aspirin 81 MG tablet Take 81 mg by mouth daily.   Yes [provider]  atorvastatin (LIPITOR) 10 MG tablet TAKE 1 TABLET BY MOUTH ONCE DAILY FOR CHOLESTEROL 12/06/18  Yes Kendell Bane, NP  carvedilol (COREG) 25 MG tablet Take 1 tablet by mouth twice daily 12/06/18  Yes [name], MD  diltiazem (CARDIZEM CD) 240 MG 24 hr capsule Take 240 mg by mouth daily.   Yes [provider]  furosemide (LASIX) 40 MG tablet TAKE 1 TABLET BY MOUTH ONCE DAILY FOR  FLUID  MAY  TAKE  1/2  TABLET  EXTRA  IF  NEEDED 02/22/19  Yes Scarboro, Audie Clear, NP  hydrALAZINE (APRESOLINE)  25 MG tablet TAKE 1 & 1/2 (ONE & ONE-HALF) TABLETS BY MOUTH THREE TIMES DAILY FOR BLOOD PRESSURE 02/22/19  Yes Scarboro, Audie Clear, NP  insulin aspart protamine- aspart (NOVOLOG MIX 70/30) (70-30) 100 UNIT/ML injection Inject 21-34 Units into the skin 2 (two) times daily with a meal. 34 units in morning and 21 units in the evening   Yes [provider]  lisinopril-hydrochlorothiazide (ZESTORETIC) 20-12.5 MG tablet Take 1 tablet by mouth daily.   Yes [provider]  pantoprazole (PROTONIX) 40 MG tablet Take 40 mg  by mouth daily. 06/09/18  Yes [provider]  warfarin (COUMADIN) 5 MG tablet TAKE ONE TABLET BY MOUTH ONCE DAILY AT 5-6PM AND TAKE ONE AND ONE-HALF TABLET BY MOUTH ON THURSDAY 02/22/19  Yes Boscia, Heather E, NP      VITAL SIGNS:  Blood pressure (!) 190/85, pulse 90, temperature 99 F (37.2 C), temperature source Axillary, resp. rate (!) 35, height 5\' 2"  (1.575 m), weight 90.7 kg, SpO2 90 %.  PHYSICAL EXAMINATION:  Physical Exam Constitutional:      Comments: On BiPAP.  HENT:     Head: Normocephalic.     Mouth/Throat:     Mouth: Mucous membranes are moist.  Eyes:     General: No scleral icterus.    Conjunctiva/sclera: Conjunctivae normal.     Pupils: Pupils are equal, round, and reactive to light.  Neck:     Musculoskeletal: Normal range of motion and neck supple.     Vascular: No JVD.     Trachea: No tracheal deviation.  Cardiovascular:     Rate and Rhythm: Normal rate and regular rhythm.     Heart sounds: Normal heart sounds. No murmur. No gallop.   Pulmonary:     Effort: Pulmonary effort is normal. No respiratory distress.     Breath sounds: No stridor. Rales present. No wheezing or rhonchi.     Comments: Diminished breath sounds. Chest:     Chest wall: No tenderness.  Abdominal:     General: Bowel sounds are normal. There is no distension.     Palpations: Abdomen is soft.     Tenderness: There is no abdominal tenderness. There is no rebound.  Musculoskeletal: Normal range of motion.        General: No tenderness.     Right lower leg: Edema present.     Left lower leg: Edema present.  Skin:    Findings: No erythema or rash.  Neurological:     General: No focal deficit present.     Mental Status: She is alert and oriented to person, place, and time.     Cranial Nerves: No cranial nerve deficit.  Psychiatric:        Mood and Affect: Mood normal.    LABORATORY PANEL:   CBC Recent Labs  Lab 03/01/19 1109  WBC 7.0  HGB 10.7*  HCT 36.4  PLT 323    ------------------------------------------------------------------------------------------------------------------  Chemistries  Recent Labs  Lab 03/01/19 1109  NA 140  K 4.1  CL 103  CO2 28  GLUCOSE 145*  BUN 17  CREATININE 0.91  CALCIUM 9.0   ------------------------------------------------------------------------------------------------------------------  Cardiac Enzymes No results for input(s): TROPONINI in the last 168 hours. ------------------------------------------------------------------------------------------------------------------  RADIOLOGY:  Dg Chest Portable 1 View  Result Date: 03/01/2019 CLINICAL DATA:  Shortness of breath, history CHF, COPD, diabetes mellitus, hypertension EXAM: PORTABLE CHEST 1 VIEW COMPARISON:  Portable exam 1106 hours compared to 02/07/2019 FINDINGS: LEFT subclavian sequential transvenous pacemaker leads project at  RIGHT atrium and RIGHT ventricle. Enlargement of cardiac silhouette with pulmonary vascular congestion. Atherosclerotic calcification aorta mild tortuosity. Scattered interstitial infiltrates consistent with mild pulmonary edema. No pleural effusion or gross pneumothorax, though assessment of the lung apices is limited by superimposition of the patient's chin. IMPRESSION: Mild CHF. Electronically Signed   By: Lavonia Dana M.D.   On: 03/01/2019 11:55      IMPRESSION AND PLAN:   Acute on chronic respiratory failure with hypoxia due to acute on chronic diastolic CHF. The patient will be admitted to stepdown unit. Continue BiPAP, DuoNeb every 6 hours, continue home nebulizer.  Intensivist consult.  Acute on chronic diastolic CHF. Start CHF protocol, Lasix 40 mg IV twice daily, continue lisinopril I called this month reported The left ventricle has normal systolic function with an ejection fraction of 60-65%.  COPD.  Continue home nebulizer.  Hypertension.  Continue home hypertension medication. Diabetes.  Start sliding scale.   Continue home NovoLog 70/30. CKD stage II.  Stable.  All the records are reviewed and case discussed with ED provider. Management plans discussed with the patient, her daughter and they are in agreement.  CODE STATUS:   TOTAL TIME TAKING CARE OF THIS PATIENT: 52 minutes.   Demetrios Loll M.D on 03/01/2019 at 1:49 PM  Between 7am to 6pm - Pager - [phone]  After 6pm go to [email or web address] - password [email or web address] [facility]  [employer] [facility]  Office  [phone]  CC: Primary care physician; [name], MD   Note: This dictation was prepared with Dragon dictation along with smaller phrase technology. Any transcriptional errors that result from this process are unin

## 2019-03-01 NOTE — ED Notes (Addendum)
Pt switched from 10L on non-rebreather to 4L on [place]. O2 sat 98-99%

## 2019-03-02 ENCOUNTER — Inpatient Hospital Stay: Payer: Medicare Other

## 2019-03-02 DIAGNOSIS — I509 Heart failure, unspecified: Secondary | ICD-10-CM

## 2019-03-02 DIAGNOSIS — Z515 Encounter for palliative care: Secondary | ICD-10-CM

## 2019-03-02 DIAGNOSIS — Z7189 Other specified counseling: Secondary | ICD-10-CM

## 2019-03-02 DIAGNOSIS — J984 Other disorders of lung: Secondary | ICD-10-CM

## 2019-03-02 LAB — BASIC METABOLIC PANEL
Anion gap: 12 (ref 5–15)
BUN: 25 mg/dL — ABNORMAL HIGH (ref 8–23)
CO2: 28 mmol/L (ref 22–32)
Calcium: 8.9 mg/dL (ref 8.9–10.3)
Chloride: 100 mmol/L (ref 98–111)
Creatinine, Ser: 0.97 mg/dL (ref 0.44–1.00)
GFR calc Af Amer: >60 mL/min (ref >60)
GFR calc non Af Amer: 53 mL/min — ABNORMAL LOW (ref >60)
Glucose, Bld: 254 mg/dL — ABNORMAL HIGH (ref 70–99)
Potassium: 4.3 mmol/L (ref 3.5–5.1)
Sodium: 140 mmol/L (ref 135–145)

## 2019-03-02 LAB — CBC
HCT: 35.1 % — ABNORMAL LOW (ref 36.0–46.0)
Hemoglobin: 10.3 g/dL — ABNORMAL LOW (ref 12.0–15.0)
MCH: 25.1 pg — ABNORMAL LOW (ref 26.0–34.0)
MCHC: 29.3 g/dL — ABNORMAL LOW (ref 30.0–36.0)
MCV: 85.6 fL (ref 80.0–100.0)
Platelets: 270 10*3/uL (ref 150–400)
RBC: 4.1 MIL/uL (ref 3.87–5.11)
RDW: 15.5 % (ref 11.5–15.5)
WBC: 7.5 10*3/uL (ref 4.0–10.5)
nRBC: 0 % (ref 0.0–0.2)

## 2019-03-02 LAB — PROTIME-INR
INR: 2 — ABNORMAL HIGH (ref 0.8–1.2)
Prothrombin Time: 22.3 seconds — ABNORMAL HIGH (ref 11.4–15.2)

## 2019-03-02 LAB — BRAIN NATRIURETIC PEPTIDE: B Natriuretic Peptide: 785 pg/mL — ABNORMAL HIGH (ref 0.0–100.0)

## 2019-03-02 LAB — GLUCOSE, CAPILLARY
Glucose-Capillary: 237 mg/dL — ABNORMAL HIGH (ref 70–99)
Glucose-Capillary: 268 mg/dL — ABNORMAL HIGH (ref 70–99)
Glucose-Capillary: 267 mg/dL — ABNORMAL HIGH (ref 70–99)
Glucose-Capillary: 300 mg/dL — ABNORMAL HIGH (ref 70–99)

## 2019-03-02 MED ORDER — WARFARIN - PHARMACIST DOSING INPATIENT: Freq: Every day | Status: DC

## 2019-03-02 MED ORDER — FUROSEMIDE 10 MG/ML IJ SOLN
80.0000 mg | Freq: Two times a day (BID) | INTRAMUSCULAR | Status: DC
  Administered 2019-03-02 – 2019-03-03 (×2): 80 mg via INTRAVENOUS
  Filled 2019-03-02 (×2): qty 8

## 2019-03-02 MED ORDER — INSULIN ASPART PROT & ASPART (70-30 MIX) 100 UNIT/ML ~~LOC~~ SUSP: 21.0000 [IU] | Freq: Every day | SUBCUTANEOUS | Status: DC

## 2019-03-02 MED ORDER — WARFARIN SODIUM 5 MG PO TABS
5.0000 mg | ORAL_TABLET | Freq: Once | ORAL | Status: DC
  Filled 2019-03-02: qty 1

## 2019-03-02 MED ORDER — INSULIN ASPART PROT & ASPART (70-30 MIX) 100 UNIT/ML ~~LOC~~ SUSP
10.0000 [IU] | Freq: Every day | SUBCUTANEOUS | Status: DC
  Administered 2019-03-02 – 2019-03-03 (×2): 10 [IU] via SUBCUTANEOUS
  Filled 2019-03-02 (×2): qty 10

## 2019-03-02 MED ORDER — INSULIN ASPART PROT & ASPART (70-30 MIX) 100 UNIT/ML ~~LOC~~ SUSP
17.0000 [IU] | Freq: Every day | SUBCUTANEOUS | Status: DC
  Administered 2019-03-03 – 2019-03-04 (×2): 17 [IU] via SUBCUTANEOUS
  Filled 2019-03-02 (×2): qty 10

## 2019-03-02 MED ORDER — INSULIN ASPART PROT & ASPART (70-30 MIX) 100 UNIT/ML ~~LOC~~ SUSP: 34.0000 [IU] | Freq: Every day | SUBCUTANEOUS | Status: DC

## 2019-03-02 NOTE — Progress Notes (Signed)
[facility] NAME: Kimberly Peterson    MR#:  [id number]  DATE OF BIRTH:  [birth year]  SUBJECTIVE:  CHIEF COMPLAINT:   Chief Complaint  Patient presents with  . Shortness of Breath   Better shortness of breath and cough, still on BiPAP. REVIEW OF SYSTEMS:  Review of Systems  Constitutional: Positive for malaise/fatigue. Negative for chills and fever.  HENT: Negative for sore throat.   Eyes: Negative for blurred vision and double vision.  Respiratory: Positive for cough and shortness of breath. Negative for hemoptysis, sputum production, wheezing and stridor.   Cardiovascular: Positive for leg swelling. Negative for chest pain, palpitations and orthopnea.  Gastrointestinal: Negative for abdominal pain, blood in stool, diarrhea, melena, nausea and vomiting.  Genitourinary: Negative for dysuria, flank pain and hematuria.  Musculoskeletal: Negative for back pain and joint pain.  Skin: Negative for rash.  Neurological: Negative for dizziness, sensory change, focal weakness, seizures, loss of consciousness, weakness and headaches.  Endo/Heme/Allergies: Negative for polydipsia.  Psychiatric/Behavioral: Negative for depression. The patient is not nervous/anxious.     DRUG ALLERGIES:  No Known Allergies VITALS:  Blood pressure (!) 154/65, pulse 81, temperature (!) 97.1 F (36.2 C), temperature source Axillary, resp. rate (!) 23, height 5\' 2"  (1.575 m), weight 96.2 kg, SpO2 98 %. PHYSICAL EXAMINATION:  Physical Exam Constitutional:      General: She is not in acute distress. HENT:     Head: Normocephalic.     Mouth/Throat:     Mouth: Mucous membranes are moist.  Eyes:     General: No scleral icterus.    Conjunctiva/sclera: Conjunctivae normal.     Pupils: Pupils are equal, round, and reactive to light.  Neck:     Musculoskeletal: Normal range of motion and neck supple.     Vascular: No JVD.     Trachea: No tracheal deviation.   Cardiovascular:     Rate and Rhythm: Normal rate and regular rhythm.     Heart sounds: Normal heart sounds. No murmur. No gallop.   Pulmonary:     Effort: Pulmonary effort is normal. No respiratory distress.     Breath sounds: No stridor. Rales present. No wheezing or rhonchi.  Abdominal:     General: Bowel sounds are normal. There is no distension.     Palpations: Abdomen is soft.     Tenderness: There is no abdominal tenderness. There is no rebound.  Musculoskeletal: Normal range of motion.        General: No tenderness.     Right lower leg: No edema.     Left lower leg: No edema.  Skin:    Findings: No erythema or rash.  Neurological:     General: No focal deficit present.     Mental Status: She is alert and oriented to person, place, and time.     Cranial Nerves: No cranial nerve deficit.  Psychiatric:        Mood and Affect: Mood normal.    LABORATORY PANEL:  Female CBC Recent Labs  Lab 03/02/19 0351  WBC 7.5  HGB 10.3*  HCT 35.1*  PLT 270   ------------------------------------------------------------------------------------------------------------------ Chemistries  Recent Labs  Lab 03/02/19 0351  NA 140  K 4.3  CL 100  CO2 28  GLUCOSE 254*  BUN 25*  CREATININE 0.97  CALCIUM 8.9   RADIOLOGY:  Dg Chest Port 1 View  Result Date: 03/02/2019 CLINICAL DATA:  Pulmonary disease, hypertension EXAM: PORTABLE CHEST 1  VIEW COMPARISON:  03/01/2019 FINDINGS: Bilateral interstitial and patchy alveolar airspace opacities. No pleural effusion or pneumothorax. Stable cardiomegaly. Prior CABG. Dual lead cardiac pacemaker. IMPRESSION: Mild CHF. Electronically Signed   By: Kathreen Devoid   On: 03/02/2019 08:31   ASSESSMENT AND PLAN:   Acute on chronic respiratory failure with hypoxia due to acute on chronic diastolic CHF. The patient will be admitted to stepdown unit. Continue BiPAP, DuoNeb every 6 hours, continue home nebulizer.  Acute on chronic diastolic CHF. Continue  CHF protocol, Lasix is increased to 80 mg IV twice daily, continue lisinopril Echo this month reported The left ventricle has normal systolic function with an ejection fraction of 60-65%.  COPD.  Continue home nebulizer.  Hypertension.  Continue home hypertension medication. Diabetes.  Start sliding scale.  Continue home NovoLog 70/30. CKD stage II.  Stable.  Follow-up BMP while on Lasix.  All the records are reviewed and case discussed with Care Management/Social Worker. Management plans discussed with the patient, family and they are in agreement.  CODE STATUS: Full Code  TOTAL TIME TAKING CARE OF THIS PATIENT: 33 minutes.   More than 50% of the time was spent in counseling/coordination of care: YES  POSSIBLE D/C IN 3 DAYS, DEPENDING ON CLINICAL CONDITION.   Demetrios Loll M.D on 03/02/2019 at 2:42 PM  Between 7am to 6pm - Pager - [phone]  After 6pm go to [email or web address] - [facility]

## 2019-03-02 NOTE — Progress Notes (Signed)
CRITICAL CARE NOTE      CHIEF COMPLAINT:   Acute hypoxemic respiratory failure   SUBJECTIVE    -Patient is improved overnight with diuresis and NIV support.  She had 1500cc urine output overnight.   -CXR this am for interval changes on pulmonary vascular congestion.    -Weaning FiO2 on BIPAP with goal >90%spO2  -BMP this am with stable GFR adjusted for age.  -Sitting up in bed eating lunch indepedently   -  PAST MEDICAL HISTORY   Past Medical History:  Diagnosis Date  . CHF (congestive heart failure) ([place])   . Chronic kidney disease   . COPD (chronic obstructive pulmonary disease) ([place])   . Diabetes mellitus without complication ([place])   . Hypertension      SURGICAL HISTORY   Past Surgical History:  Procedure Laterality Date  . aoric valve replacemet      [employer]  . CATARACT EXTRACTION, BILATERAL    . PACEMAKER PLACEMENT    . VALVE REPLACEMENT       FAMILY HISTORY   History reviewed. No pertinent family history.   SOCIAL HISTORY   Social History   Tobacco Use  . Smoking status: Never Smoker  . Smokeless tobacco: Never Used  Substance Use Topics  . Alcohol use: No  . Drug use: No     MEDICATIONS   Current Medication:  Current Facility-Administered Medications:  .  acetaminophen (TYLENOL) tablet 650 mg, 650 mg, Oral, Q6H PRN **OR** acetaminophen (TYLENOL) suppository 650 mg, 650 mg, Rectal, Q6H PRN, Demetrios Loll, MD .  albuterol (PROVENTIL) (2.5 MG/3ML) 0.083% nebulizer solution 2.5 mg, 2.5 mg, Nebulization, Q2H PRN, Demetrios Loll, MD .  aspirin EC tablet 81 mg, 81 mg, Oral, Daily, Demetrios Loll, MD .  atorvastatin (LIPITOR) tablet 10 mg, 10 mg, Oral, q1800, Demetrios Loll, MD .  bisacodyl (DULCOLAX) EC tablet 5 mg, 5 mg, Oral, Daily PRN, Demetrios Loll, MD .  carvedilol (COREG) tablet  25 mg, 25 mg, Oral, BID WC, Demetrios Loll, MD .  Chlorhexidine Gluconate Cloth 2 % PADS 6 each, 6 each, Topical, Daily, Ottie Glazier, MD, 6 each at 03/01/19 1500 .  diltiazem (CARDIZEM CD) 24 hr capsule 240 mg, 240 mg, Oral, Daily, Demetrios Loll, MD .  furosemide (LASIX) injection 40 mg, 40 mg, Intravenous, Q12H, Demetrios Loll, MD, 40 mg at 03/02/19 0610 .  hydrALAZINE (APRESOLINE) injection 10 mg, 10 mg, Intravenous, Q6H PRN, Demetrios Loll, MD .  hydrALAZINE (APRESOLINE) tablet 25 mg, 25 mg, Oral, Q8H, Demetrios Loll, MD .  lisinopril (ZESTRIL) tablet 20 mg, 20 mg, Oral, Daily **AND** hydrochlorothiazide (MICROZIDE) capsule 12.5 mg, 12.5 mg, Oral, Daily, Demetrios Loll, MD .  HYDROcodone-acetaminophen (NORCO/VICODIN) 5-325 MG per tablet 1-2 tablet, 1-2 tablet, Oral, Q4H PRN, Demetrios Loll, MD .  insulin aspart (novoLOG) injection 0-5 Units, 0-5 Units, Subcutaneous, Cherene Julian, MD, 2 Units at 03/01/19 2155 .  insulin aspart (novoLOG) injection 0-9 Units, 0-9 Units, Subcutaneous, TID WC, Demetrios Loll, MD, 3 Units at 03/02/19 0730 .  insulin aspart protamine- aspart (NOVOLOG MIX 70/30) injection 21 Units, 21 Units, Subcutaneous, Q supper, Tawnya Crook, RPH .  [START ON 03/03/2019] insulin aspart protamine- aspart (NOVOLOG MIX 70/30) injection 34 Units, 34 Units, Subcutaneous, Q breakfast, [place], Abby K, RPH .  ipratropium-albuterol (DUONEB) 0.5-2.5 (3) MG/3ML nebulizer solution 3 mL, 3 mL, Nebulization, Q15 min PRN, Blake Divine, MD, 3 mL at 03/01/19 1258 .  ipratropium-albuterol (DUONEB) 0.5-2.5 (3) MG/3ML nebulizer solution 3 mL, 3 mL, Nebulization,  Geraldine Solar, MD, 3 mL at 03/02/19 [phone] .  ondansetron (ZOFRAN) tablet 4 mg, 4 mg, Oral, Q6H PRN **OR** ondansetron (ZOFRAN) injection 4 mg, 4 mg, Intravenous, Q6H PRN, Demetrios Loll, MD .  pantoprazole (PROTONIX) EC tablet 40 mg, 40 mg, Oral, Daily, Demetrios Loll, MD .  senna-docusate (Senokot-S) tablet 1 tablet, 1 tablet, Oral, QHS PRN, Demetrios Loll, MD     ALLERGIES   Patient has no known allergies.    REVIEW OF SYSTEMS     10 point ROS conducted and is negative except for mild hypothermia reported by patient  PHYSICAL EXAMINATION   Vitals:   03/02/19 0600 03/02/19 0730  BP: (!) 157/74   Pulse: 72 84  Resp: 16 (!) 21  Temp:  (!) 97.1 F (36.2 C)  SpO2: 94% 96%    GENERAL mild distress due to shortness of breath HEAD: Normocephalic, atraumatic.  EYES: Pupils equal, round, reactive to light.  No scleral icterus.  MOUTH: Moist mucosal membrane. NECK: Supple. No thyromegaly. No nodules. No JVD.  PULMONARY: Decreased breath sounds bilaterally with crackles at the bases CARDIOVASCULAR: S1 and S2. Regular rate and rhythm. No murmurs, rubs, or gallops.  GASTROINTESTINAL: Soft, nontender, non-distended. No masses. Positive bowel sounds. No hepatosplenomegaly.  MUSCULOSKELETAL: No swelling, clubbing, or edema.  NEUROLOGIC: Mild distress due to acute illness SKIN:intact,warm,dry   PERTINENT DATA      Lines / Drains: Peripheral line  Cultures / Sepsis markers: Respiratory and blood  Antibiotics: Currently no antibiotics   Protocols / Consultants: None  Tests / Events: Transthoracic echo  Overnight: Patient will remain on BiPAP      Infusions:  Scheduled Medications: . aspirin EC  81 mg Oral Daily  . atorvastatin  10 mg Oral q1800  . carvedilol  25 mg Oral BID WC  . Chlorhexidine Gluconate Cloth  6 each Topical Daily  . diltiazem  240 mg Oral Daily  . furosemide  40 mg Intravenous Q12H  . hydrALAZINE  25 mg Oral Q8H  . lisinopril  20 mg Oral Daily   And  . hydrochlorothiazide  12.5 mg Oral Daily  . insulin aspart  0-5 Units Subcutaneous QHS  . insulin aspart  0-9 Units Subcutaneous TID WC  . insulin aspart protamine- aspart  21 Units Subcutaneous Q supper  . [START ON 03/03/2019] insulin aspart protamine- aspart  34 Units Subcutaneous Q breakfast  . ipratropium-albuterol  3 mL Nebulization Q6H  .  pantoprazole  40 mg Oral Daily   PRN Medications: acetaminophen **OR** acetaminophen, albuterol, bisacodyl, hydrALAZINE, HYDROcodone-acetaminophen, ipratropium-albuterol, ondansetron **OR** ondansetron (ZOFRAN) IV, senna-docusate Hemodynamic parameters:   Intake/Output: 08/25 0701 - 08/26 0700 In: -  Out: 1500 [Urine:1500]  Ventilator  Settings: FiO2 (%):  [35 %] 35 %      LAB RESULTS:  Basic Metabolic Panel: Recent Labs  Lab 03/01/19 1109 03/02/19 0351  NA 140 140  K 4.1 4.3  CL 103 100  CO2 28 28  GLUCOSE 145* 254*  BUN 17 25*  CREATININE 0.91 0.97  CALCIUM 9.0 8.9   Liver Function Tests: No results for input(s): AST, ALT, ALKPHOS, BILITOT, PROT, ALBUMIN in the last 168 hours. No results for input(s): LIPASE, AMYLASE in the last 168 hours. No results for input(s): AMMONIA in the last 168 hours. CBC: Recent Labs  Lab 03/01/19 1109 03/02/19 0351  WBC 7.0 7.5  NEUTROABS 4.9  --   HGB 10.7* 10.3*  HCT 36.4 35.1*  MCV 86.1 85.6  PLT 323 270  Cardiac Enzymes: No results for input(s): CKTOTAL, CKMB, CKMBINDEX, TROPONINI in the last 168 hours. BNP: Invalid input(s): POCBNP CBG: Recent Labs  Lab 03/01/19 1514 03/01/19 2153 03/02/19 0709  GLUCAP 208* 240* 237*     IMAGING RESULTS:  Imaging: Dg Chest Portable 1 View  Result Date: 03/01/2019 CLINICAL DATA:  Shortness of breath, history CHF, COPD, diabetes mellitus, hypertension EXAM: PORTABLE CHEST 1 VIEW COMPARISON:  Portable exam 1106 hours compared to 02/07/2019 FINDINGS: LEFT subclavian sequential transvenous pacemaker leads project at RIGHT atrium and RIGHT ventricle. Enlargement of cardiac silhouette with pulmonary vascular congestion. Atherosclerotic calcification aorta mild tortuosity. Scattered interstitial infiltrates consistent with mild pulmonary edema. No pleural effusion or gross pneumothorax, though assessment of the lung apices is limited by superimposition of the patient's chin. IMPRESSION:  Mild CHF. Electronically Signed   By: Lavonia Dana M.D.   On: 03/01/2019 11:55         ASSESSMENT AND PLAN    -Multidisciplinary rounds held today  Acute Hypoxic Respiratory Failure -Due to acute decompensated diastolic CHF EF 60 to [id number] -continue Bronchodilator Therapy -Continue BiPAP -We will perform ABG if patient does not clinically improved by tomorrow morning     Acute decompensated diastolic CHF with EF 60 to 65% -oxygen as needed -Lasix 80 mg IV twice daily -1200 cc fluid restriction per 24 hours -Continue NIV support ICU monitoring     Renal Failure-chronic stage I -follow chem 7 -follow UO -continue Foley Catheter-assess need daily    ID -continue IV abx as prescibed -follow up cultures  GI/Nutrition GI PROPHYLAXIS as indicated DIET-->TF's as tolerated Constipation protocol as indicated  ENDO - ICU hypoglycemic\Hyperglycemia protocol -check FSBS per protocol   ELECTROLYTES -follow labs as needed -replace as needed -pharmacy consultation   DVT/GI PRX ordered -SCDs  TRANSFUSIONS AS NEEDED MONITOR FSBS ASSESS the need for LABS as needed   Critical care provider statement:    Critical care time (minutes):  36   Critical care time was exclusive of:  Separately billable procedures and treating other patients   Critical care was necessary to treat or prevent imminent or life-threatening deterioration of the following conditions:   Acute hypoxemic respiratory failure, acute decompensated diastolic heart failure, obstructive sleep apnea, diabetes, essential hypertension, morbid obesity, multiple comorbid conditions   Critical care was time spent personally by me on the following activities:  Development of treatment plan with patient or surrogate, discussions with consultants, evaluation of patient's response to treatment, examination of patient, obtaining history from patient or surrogate, ordering and performing treatments and interventions,  ordering and review of laboratory studies and re-evaluation of patient's condition.  I assumed direction of critical care for this patient from another provider in my specialty: no    This document was prepared using Dragon voice recognition software and may include unintentional dictation errors.    Ottie Glazier, M.D.  Division of [facility]

## 2019-03-02 NOTE — Progress Notes (Signed)
Inpatient Diabetes Program Recommendations  AACE/ADA: New Consensus Statement on Inpatient Glycemic Control (2015)  Target Ranges:  Prepandial:   less than 140 mg/dL      Peak postprandial:   less than 180 mg/dL (1-2 hours)      Critically ill patients:  140 - 180 mg/dL   Lab Results  Component Value Date   GLUCAP 268 (H) 03/02/2019   HGBA1C 5.6 02/07/2019    Review of Glycemic Control Results for Kimberly Peterson, Kimberly Peterson (MRN [id number]) as of 03/02/2019 15:10  Ref. Range 03/01/2019 15:14 03/01/2019 21:53 03/02/2019 07:09 03/02/2019 11:27  Glucose-Capillary Latest Ref Range: 70 - 99 mg/dL 208 (H) 240 (H) 237 (H) 268 (H)   Diabetes history: DM2 Outpatient Diabetes medications: Novolog 70/30 insulin mix 34 units ac breakfast + 21 units ac supper Current orders for Inpatient glycemic control: Novolog 70/30 34 units am + 21 units pm + Novolog sensitive correction tid + hs 0-5 units  Inpatient Diabetes Program Recommendations:   -Decrease 70/30 insulin by 50% home insulin dose To 17 units am + 10 units pm Please evaluate discharge doses of insulin based on A1c of 5.6 so may decrease doses @ discharge   Thank you, Nani Gasser. Tamula Morrical, RN, MSN, CDE  Diabetes Coordinator Inpatient Glycemic Control Team Team Pager [phone] (8am-5pm) 03/02/2019 3:15 PM

## 2019-03-02 NOTE — Consult Note (Signed)
Consultation Note Date: 03/02/2019   Patient Name: Kimberly Peterson  DOB: February 20, 1930  MRN: [id number]  Age / Sex: 85 y.o., female   PCP: [name], MD Referring Physician: Demetrios Loll, MD   REASON FOR CONSULTATION:Establishing goals of care  Palliative Care consult requested for this 85 y.o. female with multiple medical problems including chronic respiratory failure, diastolic CHF (EF 51-88%), COPD (2L home oxygen), CKD II, hypertension, , aortic valve disease s/p valve replacement ([place] 2010), atrial fibrillation (coumadin), complete AV block s/p pacemakre, and diabetes. She presented to ED with complaints of worsening shortness of breath and cough. She was recently admitted for similar complaints and treated for CHF exacerbation. On admission she denied pain but endorsed bilateral lower extremity edema. She required BiPAP use for oxygen support and hypoxia. Chest x-ray showed CHF congestion and she is being treated with IV lasix. COVID negative on previous admission (02/06/19) with pending results. BNP 521 on admission and has increased to 785. Since admission she continues to require intermittent BiPAP support. PMT consulted for goals of care discussion.   Clinical Assessment and Goals of Care: I have reviewed medical records including lab results, imaging, Epic notes, and MAR, received report from the bedside RN, and assessed the patient. I met at the bedside with patient  to discuss diagnosis prognosis, GOC, EOL wishes, disposition and options. No family present. Patient is awake, alert, and oriented x3. She denies pain or shortness of breath at this time.   I introduced Palliative Medicine as specialized medical care for people living with serious illness. It focuses on providing relief from the symptoms and stress of a serious illness. The goal is to improve quality of life for both the patient and the family. Kimberly Peterson verbalized understanding and appreciation of team being involved in  her care.   We discussed a brief life review of the patient, along with her functional and nutritional status. Kimberly Peterson shares that she lives in the home with her daughter. She has 5 living children (1 local and 4 in [place]), and one deceased. She is a widow, reporting her husband passed away about 6 years ago. 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 She is of [place] faith.   Prior to admission she was able to assist with ADLs. She reports her daughters is a Quarry manager. She was participating in home PT. She ambulates with a walker. Has been on home oxygen (2L) for approximately 10 years. She reports she is able to ambulate to the bathroom and around her home independently. Her appetite is up and down, but she reports generally eating at least 2-3 meals per day with occasional snacking. Reports she weighs and monitors her blood pressure daily and has lab work weekly for her PT/INR.    We discussed Her current illness and what it means in the larger context of Her on-going co-morbidities. With specific discussions regarding her COPD, CHF, recurrent hospitalizations, and overall functional state. Natural disease trajectory and expectations at EOL were discussed.  Patient verbalized understanding of updates expressing "I try to do everything the doctors tell me by taking my medications, weighing, and watching what I eat!" She states "I know I am getting old, but I know I still have life!"   She shares with me that she is 1 of 9 siblings with only 3 surviving. She states her 3 sisters who all passed knew when it was almost time and was able to say "this  is it, I am tired of fighting, let me go be with the Reita Cliche!" support given. Patient states she is hopeful she will have that opportunity, but is optimistic that she will know when she can't fight or is tired of fighting and be able to comfortably make that decision. She states "but right now I still have fight and I am not tired yet!"    I attempted to elicit values and goals of care important to the patient.    The difference between aggressive medical intervention and comfort care was considered in light of the patient's goals of care. Patient is requesting to continue with current medical interventions. She remains hopeful for the best but is also preparing herself for the worst (meaning when it is time to not do aggressive interventions but be at peace and comfort).   Patient reports she has an advanced directive. Her daughter, [name] is her HCPOA. I discuss with patient her current full code status with consideration to her current illness and co-morbidities. She verbalized understanding. Patient reports "I am still fighting and I want everything done that is possible except being kept on life-support!" I discuss DNR/DNI and all concepts (CPR, defibrillation, BiPAP, ACLS, mechanical ventilation). She verbalized understanding expressing she does want all of these interventions except being intubated. I educated patient on what a cardiopulmonary arrest. She verbalized understanding expressing "if this happens you do what you can and if I don't make it then just let me go on to be with God but at least try. If I am not going to have a chance just let my children know, they will be ok. I am ready when God says it is over!" I used her statement to elaborate discussing specifically " I am ready when God says it is over" I shared with patient if she suffers an arrest we would artificially be intervening with God as she is clinically dead and we as medical providers are attempting to bring her back. She verbalized understanding and again stating "at least try and then if I don't make it we know it is time!"   She does express she would never want artificial feedings/PEG, dialysis, or tracheostomy. She states at that point it would be time to let go.   Hospice and Palliative Care services outpatient were explained and offered. Patient and  family verbalized their understanding and awareness of both palliative and hospice's goals and philosophy of care. Given patient's request to continue with aggressive medical interventions recommendations at minimum given for outpatient palliative. Ms. Kohl verbalized understanding and agreement.   Questions and concerns were addressed.  Patient was encouraged to call with questions or concerns.  PMT will continue to support holistically.   SOCIAL HISTORY:     reports that she has never smoked. She has never used smokeless tobacco. She reports that she does not drink alcohol or use drugs.  CODE STATUS: Full code  ADVANCE DIRECTIVES: Anne Hahn (daughter/POA)   SYMPTOM MANAGEMENT: per attending   Palliative Prophylaxis:   Aspiration, Bowel Regimen, Eye Care, Frequent Pain Assessment, Oral Care and Turn Reposition  PSYCHO-SOCIAL/SPIRITUAL:  Support System: Family   Desire for further Chaplaincy support:NO   Additional Recommendations (Limitations, Scope, Preferences):  Full Scope Treatment, No Artificial Feeding, No Hemodialysis and No Tracheostomy   PAST MEDICAL HISTORY: Past Medical History:  Diagnosis Date   CHF (congestive heart failure) (HCC)    Chronic kidney disease    COPD (chronic obstructive pulmonary disease) (HCC)    Diabetes mellitus  without complication ([place])    Hypertension     PAST SURGICAL HISTORY:  Past Surgical History:  Procedure Laterality Date   aoric valve replacemet      st Jude   CATARACT EXTRACTION, BILATERAL     PACEMAKER PLACEMENT     VALVE REPLACEMENT      ALLERGIES:  has No Known Allergies.   MEDICATIONS:  Current Facility-Administered Medications  Medication Dose Route Frequency Provider Last Rate Last Dose   acetaminophen (TYLENOL) tablet 650 mg  650 mg Oral Q6H PRN Demetrios Loll, MD       Or   acetaminophen (TYLENOL) suppository 650 mg  650 mg Rectal Q6H PRN Demetrios Loll, MD       albuterol (PROVENTIL) (2.5 MG/3ML)  0.083% nebulizer solution 2.5 mg  2.5 mg Nebulization Q2H PRN Demetrios Loll, MD       aspirin EC tablet 81 mg  81 mg Oral Daily Demetrios Loll, MD       atorvastatin (LIPITOR) tablet 10 mg  10 mg Oral q1800 Demetrios Loll, MD       bisacodyl (DULCOLAX) EC tablet 5 mg  5 mg Oral Daily PRN Demetrios Loll, MD       carvedilol (COREG) tablet 25 mg  25 mg Oral BID WC Demetrios Loll, MD       Chlorhexidine Gluconate Cloth 2 % PADS 6 each  6 each Topical Daily Ottie Glazier, MD   Stopped at 03/02/19 1115   diltiazem (CARDIZEM CD) 24 hr capsule 240 mg  240 mg Oral Daily Demetrios Loll, MD       furosemide (LASIX) injection 80 mg  80 mg Intravenous Q12H Ottie Glazier, MD       hydrALAZINE (APRESOLINE) injection 10 mg  10 mg Intravenous Q6H PRN Demetrios Loll, MD       hydrALAZINE (APRESOLINE) tablet 25 mg  25 mg Oral Q8H Demetrios Loll, MD       lisinopril (ZESTRIL) tablet 20 mg  20 mg Oral Daily Demetrios Loll, MD       And   hydrochlorothiazide (MICROZIDE) capsule 12.5 mg  12.5 mg Oral Daily Demetrios Loll, MD       HYDROcodone-acetaminophen (NORCO/VICODIN) 5-325 MG per tablet 1-2 tablet  1-2 tablet Oral Q4H PRN Demetrios Loll, MD       insulin aspart (novoLOG) injection 0-5 Units  0-5 Units Subcutaneous QHS Demetrios Loll, MD   2 Units at 03/01/19 2155   insulin aspart (novoLOG) injection 0-9 Units  0-9 Units Subcutaneous TID WC Demetrios Loll, MD   5 Units at 03/02/19 1218   insulin aspart protamine- aspart (NOVOLOG MIX 70/30) injection 21 Units  21 Units Subcutaneous Q supper Tawnya Crook, RPH       [START ON 03/03/2019] insulin aspart protamine- aspart (NOVOLOG MIX 70/30) injection 34 Units  34 Units Subcutaneous Q breakfast Ellington, Abby K, RPH       ipratropium-albuterol (DUONEB) 0.5-2.5 (3) MG/3ML nebulizer solution 3 mL  3 mL Nebulization Q15 min PRN Blake Divine, MD   3 mL at 03/01/19 1258   ipratropium-albuterol (DUONEB) 0.5-2.5 (3) MG/3ML nebulizer solution 3 mL  3 mL Nebulization Q6H Demetrios Loll, MD   3 mL at  03/02/19 0737   ondansetron (ZOFRAN) tablet 4 mg  4 mg Oral Q6H PRN Demetrios Loll, MD       Or   ondansetron [facility]) injection 4 mg  4 mg Intravenous Q6H PRN Demetrios Loll, MD       pantoprazole (PROTONIX) EC tablet  40 mg  40 mg Oral Daily Demetrios Loll, MD       senna-docusate (Senokot-S) tablet 1 tablet  1 tablet Oral QHS PRN Demetrios Loll, MD       warfarin (COUMADIN) tablet 5 mg  5 mg Oral ONCE-1800 Ellington, Abby K, [facility]       Warfarin - Pharmacist Dosing Inpatient   Does not apply q1800 Tawnya Crook, RPH        VITAL SIGNS: BP (!) 172/89    Pulse 80    Temp (!) 97.1 F (36.2 C) (Axillary)    Resp 20    Ht 5' 2"  (1.575 m)    Wt 96.2 kg    SpO2 97%    BMI 38.79 kg/m  Filed Weights   03/01/19 1105 03/02/19 0403  Weight: 90.7 kg 96.2 kg    Estimated body mass index is 38.79 kg/m as calculated from the following:   Height as of this encounter: 5' 2"  (1.575 m).   Weight as of this encounter: 96.2 kg.  LABS: CBC:    Component Value Date/Time   WBC 7.5 03/02/2019 0351   HGB 10.3 (L) 03/02/2019 0351   HGB 10.5 (L) 09/28/2017 0928   HCT 35.1 (L) 03/02/2019 0351   HCT 33.2 (L) 09/28/2017 0928   PLT 270 03/02/2019 0351   PLT 287 09/28/2017 0928   Comprehensive Metabolic Panel:    Component Value Date/Time   NA 140 03/02/2019 0351   NA 144 09/28/2017 0928   NA 136 09/10/2013 0421   K 4.3 03/02/2019 0351   K 4.3 09/10/2013 0421   CO2 28 03/02/2019 0351   CO2 35 (H) 09/10/2013 0421   BUN 25 (H) 03/02/2019 0351   BUN 16 09/28/2017 0928   BUN 12 09/10/2013 0421   CREATININE 0.97 03/02/2019 0351   CREATININE 1.04 09/10/2013 0421   ALBUMIN 3.9 09/28/2017 0928   ALBUMIN 2.6 (L) 09/09/2013 0432     Review of Systems  Respiratory: Positive for shortness of breath.   Neurological: Positive for weakness.  All other systems reviewed and are negative.  Physical Exam General: NAD, chronically-ill appearing Cardiovascular: regular rate and rhythm Pulmonary: diminished bases,  tolerating nasal cannula 3L Abdomen: soft, nontender, + bowel sounds Extremities: bilateral lower extremity trace edema, discoloration, no joint deformities Skin: no rashes, warm, dry Neurological: Weakness but otherwise nonfocal   Prognosis: Guarded to Poor in the setting of diastolic CHF, acute on chronic respiratory failure, COPD, diabetes, generalized weakness, CKD, and CAD.   Discharge Planning:  Based on patient's expressed goals home with continued health care and outpatient palliative.   Recommendations:  Full Code-as confirmed by patient (she states she would not want to be intubated long-term, no PEG, HD, or tracheostomy.   Continue current plan of care per medical team   Patient's expressed goals is to continue with full aggressive interventions. Agrees to outpatient palliative support. (CM referral).  PMT will continue to support and follow    Palliative Performance Scale: PPS 30%               Patient expressed understanding and was in agreement with this plan.   Thank you for allowing the Palliative Medicine Team to assist in the care of this patient.  Time In: 1120 Time Out: 1215 Time Total: 60 min.   Visit consisted of counseling and education dealing with the complex and emotionally intense issues of symptom management and palliative care in the setting of serious and potentially life-threatening illness.Greater  than 50%  of this time was spent counseling and coordinating care related to the above assessment and plan.  Signed by:  Alda Lea, AGPCNP-BC Palliative Medicine Team  Phone: [phone] Fax: [phone] Pager: [phone] Amion: Bjorn Pippin

## 2019-03-02 NOTE — Consult Note (Signed)
ANTICOAGULATION CONSULT NOTE - Initial Consult  Pharmacy Consult for Warfarin Dosing Indication: atrial fibrillation  No Known Allergies  Patient Measurements: Height: 5\' 2"  (157.5 cm) Weight: 212 lb 1.3 oz (96.2 kg) IBW/kg (Calculated) : 50.1  Vital Signs: Temp: 97.1 F (36.2 C) (08/26 0730) Temp Source: Axillary (08/26 0730) BP: 172/89 (08/26 1000) Pulse Rate: 80 (08/26 1000)  Labs: Recent Labs    03/01/19 1109 03/01/19 1323 03/02/19 0351  HGB 10.7*  --  10.3*  HCT 36.4  --  35.1*  PLT 323  --  270  APTT 51*  --   --   LABPROT 47.4*  --  22.3*  INR 5.3*  --  2.0*  CREATININE 0.91  --  0.97  TROPONINIHS 33* 67*  --     Estimated Creatinine Clearance: 44.2 mL/min (by C-G formula based on SCr of 0.97 mg/dL).   Medical History: Past Medical History:  Diagnosis Date  . CHF (congestive heart failure) ([place])   . Chronic kidney disease   . COPD (chronic obstructive pulmonary disease) ([place])   . Diabetes mellitus without complication ([place])   . Hypertension     Medications:  Medications Prior to Admission  Medication Sig Dispense Refill Last Dose  . aspirin 81 MG tablet Take 81 mg by mouth daily.   02/28/2019 at Unknown time  . atorvastatin (LIPITOR) 10 MG tablet TAKE 1 TABLET BY MOUTH ONCE DAILY FOR CHOLESTEROL 90 tablet 3 02/28/2019 at Unknown time  . carvedilol (COREG) 25 MG tablet Take 1 tablet by mouth twice daily 180 tablet 0 02/28/2019 at Unknown time  . diltiazem (CARDIZEM CD) 240 MG 24 hr capsule Take 240 mg by mouth daily.   02/28/2019 at Unknown time  . furosemide (LASIX) 40 MG tablet TAKE 1 TABLET BY MOUTH ONCE DAILY FOR  FLUID  MAY  TAKE  1/2  TABLET  EXTRA  IF  NEEDED 120 tablet 0 02/28/2019 at Unknown time  . hydrALAZINE (APRESOLINE) 25 MG tablet TAKE 1 & 1/2 (ONE & ONE-HALF) TABLETS BY MOUTH THREE TIMES DAILY FOR BLOOD PRESSURE 405 tablet 0 02/28/2019 at Unknown time  . insulin aspart protamine- aspart (NOVOLOG MIX 70/30) (70-30) 100 UNIT/ML injection Inject  21-34 Units into the skin 2 (two) times daily with a meal. 34 units in morning and 21 units in the evening   02/28/2019 at Unknown time  . lisinopril-hydrochlorothiazide (ZESTORETIC) 20-12.5 MG tablet Take 1 tablet by mouth daily.   02/28/2019 at Unknown time  . pantoprazole (PROTONIX) 40 MG tablet Take 40 mg by mouth daily.   02/28/2019 at Unknown time  . warfarin (COUMADIN) 5 MG tablet TAKE ONE TABLET BY MOUTH ONCE DAILY AT 5-6PM AND TAKE ONE AND ONE-HALF TABLET BY MOUTH ON THURSDAY 90 tablet 4 Past Week at Unknown time   Scheduled:  . aspirin EC  81 mg Oral Daily  . atorvastatin  10 mg Oral q1800  . carvedilol  25 mg Oral BID WC  . Chlorhexidine Gluconate Cloth  6 each Topical Daily  . diltiazem  240 mg Oral Daily  . furosemide  80 mg Intravenous Q12H  . hydrALAZINE  25 mg Oral Q8H  . lisinopril  20 mg Oral Daily   And  . hydrochlorothiazide  12.5 mg Oral Daily  . insulin aspart  0-5 Units Subcutaneous QHS  . insulin aspart  0-9 Units Subcutaneous TID WC  . insulin aspart protamine- aspart  21 Units Subcutaneous Q supper  . [START ON 03/03/2019] insulin aspart protamine- aspart  34 Units Subcutaneous Q breakfast  . ipratropium-albuterol  3 mL Nebulization Q6H  . pantoprazole  40 mg Oral Daily   Infusions:   PRN: acetaminophen **OR** acetaminophen, albuterol, bisacodyl, hydrALAZINE, HYDROcodone-acetaminophen, ipratropium-albuterol, ondansetron **OR** ondansetron (ZOFRAN) IV, senna-docusate Anti-infectives (From admission, onward)   None      Assessment: Pharmacy has been consulted for Warfarin therapy in 85yo patient admitted with acute and chronic respiratory failure with hypoxia. Patient takes Warfarin 5mg  everyday but Thursday, when she takes 7.5mg . Patient stated that she took her last dose 8/24. Appears patient is typically within therapeutic range with INR.  Date INR Dose 8/25 5.3 Vit K 5 mg 8/26 2  Goal of Therapy:  INR 2-3 Monitor platelets by anticoagulation protocol:  Yes   Plan:  INR 2 therapeutic. Decreased from 5.3 at admission s/p administration of Vitamin K. Patient with no signs of bleeding at the time and CBC stable. Will continue with home dose of 5 mg tonight. INR and CBC with AM labs.  Tawnya Crook, PharmD 03/02/2019,11:23 AM

## 2019-03-03 ENCOUNTER — Inpatient Hospital Stay: Payer: Medicare Other

## 2019-03-03 ENCOUNTER — Telehealth: Payer: Self-pay

## 2019-03-03 DIAGNOSIS — J9621 Acute and chronic respiratory failure with hypoxia: Secondary | ICD-10-CM

## 2019-03-03 LAB — CBC
HCT: 34 % — ABNORMAL LOW (ref 36.0–46.0)
Hemoglobin: 10 g/dL — ABNORMAL LOW (ref 12.0–15.0)
MCH: 25.9 pg — ABNORMAL LOW (ref 26.0–34.0)
MCHC: 29.4 g/dL — ABNORMAL LOW (ref 30.0–36.0)
MCV: 88.1 fL (ref 80.0–100.0)
Platelets: 257 10*3/uL (ref 150–400)
RBC: 3.86 MIL/uL — ABNORMAL LOW (ref 3.87–5.11)
RDW: 15.6 % — ABNORMAL HIGH (ref 11.5–15.5)
WBC: 13.3 10*3/uL — ABNORMAL HIGH (ref 4.0–10.5)
nRBC: 0 % (ref 0.0–0.2)

## 2019-03-03 LAB — PROTIME-INR
INR: 1.4 — ABNORMAL HIGH (ref 0.8–1.2)
Prothrombin Time: 17 seconds — ABNORMAL HIGH (ref 11.4–15.2)

## 2019-03-03 LAB — BASIC METABOLIC PANEL
Anion gap: 8 (ref 5–15)
BUN: 42 mg/dL — ABNORMAL HIGH (ref 8–23)
CO2: 33 mmol/L — ABNORMAL HIGH (ref 22–32)
Calcium: 8.6 mg/dL — ABNORMAL LOW (ref 8.9–10.3)
Chloride: 99 mmol/L (ref 98–111)
Creatinine, Ser: 1.24 mg/dL — ABNORMAL HIGH (ref 0.44–1.00)
GFR calc Af Amer: 45 mL/min — ABNORMAL LOW (ref >60)
GFR calc non Af Amer: 39 mL/min — ABNORMAL LOW (ref >60)
Glucose, Bld: 278 mg/dL — ABNORMAL HIGH (ref 70–99)
Potassium: 4.8 mmol/L (ref 3.5–5.1)
Sodium: 140 mmol/L (ref 135–145)

## 2019-03-03 LAB — GLUCOSE, CAPILLARY
Glucose-Capillary: 257 mg/dL — ABNORMAL HIGH (ref 70–99)
Glucose-Capillary: 289 mg/dL — ABNORMAL HIGH (ref 70–99)
Glucose-Capillary: 260 mg/dL — ABNORMAL HIGH (ref 70–99)
Glucose-Capillary: 195 mg/dL — ABNORMAL HIGH (ref 70–99)

## 2019-03-03 LAB — BRAIN NATRIURETIC PEPTIDE: B Natriuretic Peptide: 686 pg/mL — ABNORMAL HIGH (ref 0.0–100.0)

## 2019-03-03 MED ORDER — FUROSEMIDE 10 MG/ML IJ SOLN
40.0000 mg | Freq: Two times a day (BID) | INTRAMUSCULAR | Status: DC
  Administered 2019-03-03: 40 mg via INTRAVENOUS
  Filled 2019-03-03: qty 4

## 2019-03-03 MED ORDER — BISACODYL 10 MG RE SUPP
10.0000 mg | Freq: Once | RECTAL | Status: AC
  Administered 2019-03-03: 10 mg via RECTAL
  Filled 2019-03-03: qty 1

## 2019-03-03 MED ORDER — WARFARIN SODIUM 7.5 MG PO TABS
7.5000 mg | ORAL_TABLET | Freq: Once | ORAL | Status: AC
  Administered 2019-03-03: 18:00:00 7.5 mg via ORAL
  Filled 2019-03-03: qty 1

## 2019-03-03 NOTE — TOC Initial Note (Signed)
Transition of Care [facility]) - Initial/Assessment Note    Patient Details  Name: Kimberly Peterson MRN: [id number] Date of Birth: [birth year]  [facility] CM/SW Contact:    Candie Chroman, LCSW Phone Number: 03/03/2019, 10:41 AM  Clinical Narrative: Readmission prevention screen complete. CSW met with patient, introduced role, and explained that discharge planning would be discussed. Patient confirmed she was active with [facility] prior to admission and would like to continue home health with them once she goes home. She is also agreeable to outpatient palliative follow up. Made referral to [place]. She uses oxygen and a rolling walker at home. Her daughter typically drives her to appts but lately due to [place] restrictions, she has been doing telephonic appts. No issues paying for medications. No further concerns. CSW encouraged patient to contact CSW as needed. CSW will continue to follow patient for support and facilitate return home once stable.                 Expected Discharge Plan: [place] Barriers to Discharge: Continued Medical Work up   Patient Goals and CMS Choice     Choice offered to / list presented to : NA  Expected Discharge Plan and Services Expected Discharge Plan: [place] Choice: Home Health(Outpatient palliative) Living arrangements for the past 2 months: Single Family Home                           HH Arranged: RN, PT, Nurse's Aide [place]: [facility] [employer] Date [place]: 03/02/19   Representative spoke with at [place] Arrangements/Services Living arrangements for the past 2 months: [place] with:: Adult Children Patient language and need for interpreter reviewed:: Yes Do you feel safe going back to the place where you live?: Yes      Need for Family Participation in Patient Care: Yes  (Comment) Care giver support system in place?: Yes (comment) Current home services: DME, Homehealth aide, Home PT, Home RN Criminal Activity/Legal Involvement Pertinent to Current Situation/Hospitalization: No - Comment as needed  Activities of Daily Living      Permission Sought/Granted Permission sought to share information with : Facility Art therapist granted to share information with : Yes, Verbal Permission Granted     Permission granted to share info w AGENCY: [facility]        Emotional Assessment Appearance:: Appears stated age Attitude/Demeanor/Rapport: Engaged, Gracious Affect (typically observed): Accepting, Appropriate, Calm, Pleasant Orientation: : Oriented to Self, Oriented to Place, Oriented to  Time, Oriented to Situation Alcohol / Substance Use: Never Used Psych Involvement: No (comment)  Admission diagnosis:  Shortness of breath [R06.02] Acute on chronic congestive heart failure, unspecified heart failure type [facility]) [I50.9] Patient Active Problem List   Diagnosis Date Noted  . Acute and chronic respiratory failure with hypoxia ([place]) 03/01/2019  . Acute respiratory failure with hypoxia ([place]) 02/07/2019  . Type 2 diabetes with peripheral circulatory disorder, controlled ([place]) 10/17/2018  . Complete heart block ([place]) 09/09/2018  . Severe aortic valve stenosis 09/09/2018  . Lymphedema 08/12/2018  . Uncontrolled type 2 diabetes mellitus with hyperglycemia ([place]) 02/25/2018  . Atrial fibrillation ([place]) 02/25/2018  . Encounter for current long-term use of anticoagulants 02/25/2018  . Dependence on supplemental oxygen 02/25/2018  . Lower extremity edema 02/25/2018  .  CHF (congestive heart failure) ([place]) 12/04/2015  . Diabetes mellitus ([place]) 12/04/2015  . Chronic obstructive pulmonary disease ([place]) 09/20/2015  . Diastolic CHF, acute on chronic (HCC) 09/19/2015  . Benign essential HTN 09/19/2015  . Controlled diabetes mellitus without  complication, with long-term current use of insulin ([place]) 09/19/2015  . Hyperlipidemia, mixed 09/04/2014  . Pulmonary embolism ([place]) 09/23/2013   PCP:  [name], MD Pharmacy:   [facility] [address], [place] - [facility] [address] [place] [address] Phone: [phone] Fax: [phone]     Social Determinants of Health (SDOH) Interventions    Readmission Risk Interventions Readmission Risk Prevention Plan 03/03/2019  Transportation Screening Complete  HRI or Home Care Consult Complete  Palliative Care Screening Complete  Medication Review (RN Care Manager) Complete  Some recent data might be hidden

## 2019-03-03 NOTE — Progress Notes (Signed)
Results for SHAELI, ANGELICA (MRN [id number]) as of 03/03/2019 14:17  Ref. Range 03/02/2019 11:27 03/02/2019 15:59 03/02/2019 20:55 03/03/2019 07:25 03/03/2019 11:45  Glucose-Capillary Latest Ref Range: 70 - 99 mg/dL 268 (H) 267 (H) 300 (H) 257 (H) 289 (H)  Noted that CBGs have been elevated with patient taking 50% of her home dose of 70/30 insulin.  Recommend increasing 70/30 dosages to 70% of home dose: 70/30 insulin 23 units every am 70/30 insulin 14 units every pm.  Will continue to monitor blood sugars while in the hospital.  Harvel Ricks RN BSN CDE Diabetes Coordinator Pager: [phone]  8am-5pm

## 2019-03-03 NOTE — Progress Notes (Signed)
Patient declined Bipap at this time. Patient resting comfortably in chair, 2L [place], no distress noted. Patient advised to call RT if she changes her mind.

## 2019-03-03 NOTE — Progress Notes (Signed)
New referral for outpatient Palliative to follow at home received from [place]. Patient to be followed by [facility]. Patient information faxed to referral. Flo Shanks BSN, RN, [place] [phone]

## 2019-03-03 NOTE — Progress Notes (Signed)
[facility] NAME: Kimberly Peterson    MR#:  [id number]  DATE OF BIRTH:  Jun 13, 1930  SUBJECTIVE:  CHIEF COMPLAINT:   Chief Complaint  Patient presents with  . Shortness of Breath   Better shortness of breath and cough,  Had good diuresis. Off Bipap now. REVIEW OF SYSTEMS:  Review of Systems  Constitutional: Positive for malaise/fatigue. Negative for chills and fever.  HENT: Negative for sore throat.   Eyes: Negative for blurred vision and double vision.  Respiratory: Positive for cough and shortness of breath. Negative for hemoptysis, sputum production, wheezing and stridor.   Cardiovascular: Positive for leg swelling. Negative for chest pain, palpitations and orthopnea.  Gastrointestinal: Negative for abdominal pain, blood in stool, diarrhea, melena, nausea and vomiting.  Genitourinary: Negative for dysuria, flank pain and hematuria.  Musculoskeletal: Negative for back pain and joint pain.  Skin: Negative for rash.  Neurological: Negative for dizziness, sensory change, focal weakness, seizures, loss of consciousness, weakness and headaches.  Endo/Heme/Allergies: Negative for polydipsia.  Psychiatric/Behavioral: Negative for depression. The patient is not nervous/anxious.     DRUG ALLERGIES:  No Known Allergies VITALS:  Blood pressure 116/63, pulse 74, temperature 98.6 F (37 C), temperature source Oral, resp. rate 15, height 5\' 2"  (1.575 m), weight 96.2 kg, SpO2 97 %. PHYSICAL EXAMINATION:  Physical Exam Constitutional:      General: She is not in acute distress. HENT:     Head: Normocephalic.     Mouth/Throat:     Mouth: Mucous membranes are moist.  Eyes:     General: No scleral icterus.    Conjunctiva/sclera: Conjunctivae normal.     Pupils: Pupils are equal, round, and reactive to light.  Neck:     Musculoskeletal: Normal range of motion and neck supple.     Vascular: No JVD.     Trachea: No tracheal deviation.   Cardiovascular:     Rate and Rhythm: Normal rate and regular rhythm.     Heart sounds: Normal heart sounds. No murmur. No gallop.   Pulmonary:     Effort: Pulmonary effort is normal. No respiratory distress.     Breath sounds: No stridor. Rales present. No wheezing or rhonchi.  Abdominal:     General: Bowel sounds are normal. There is no distension.     Palpations: Abdomen is soft.     Tenderness: There is no abdominal tenderness. There is no rebound.  Musculoskeletal: Normal range of motion.        General: No tenderness.     Right lower leg: No edema.     Left lower leg: No edema.  Skin:    Findings: No erythema or rash.  Neurological:     General: No focal deficit present.     Mental Status: She is alert and oriented to person, place, and time.     Cranial Nerves: No cranial nerve deficit.  Psychiatric:        Mood and Affect: Mood normal.    LABORATORY PANEL:  Female CBC Recent Labs  Lab 03/03/19 0611  WBC 13.3*  HGB 10.0*  HCT 34.0*  PLT 257   ------------------------------------------------------------------------------------------------------------------ Chemistries  Recent Labs  Lab 03/03/19 0611  NA 140  K 4.8  CL 99  CO2 33*  GLUCOSE 278*  BUN 42*  CREATININE 1.24*  CALCIUM 8.6*   RADIOLOGY:  Dg Abd 1 View  Result Date: 03/03/2019 CLINICAL DATA:  Chronic diffuse abdominal pain. EXAM: ABDOMEN -  1 VIEW COMPARISON:  Plain films of the abdomen 09/19/2015. CT abdomen and pelvis 11/21/2013. FINDINGS: The bowel gas pattern is nonobstructive. There is a large volume of stool throughout the colon. Calcified uterine fibroids are seen as on prior exams. IMPRESSION: No acute finding. Large colonic stool burden. Calcified uterine fibroids. Electronically Signed   By: Inge Rise M.D.   On: 03/03/2019 11:20   ASSESSMENT AND PLAN:   Acute on chronic respiratory failure with hypoxia due to acute on chronic diastolic CHF. The patient will be admitted to stepdown  unit. Continue BiPAP, DuoNeb every 6 hours, continue home nebulizer.  off Bipap, move to floor.  Acute on chronic diastolic CHF. Continue CHF protocol, Lasix is increased to 80 mg IV twice daily, continue lisinopril Echo this month reported The left ventricle has normal systolic function with an ejection fraction of 60-65%  improved, decrease lasix now.  COPD.  Continue home nebulizer.  Hypertension.  Continue home hypertension medication. Diabetes.  Start sliding scale.  Continue home NovoLog 70/30. CKD stage II.  Stable.  Follow-up BMP while on Lasix.  All the records are reviewed and case discussed with Care Management/Social Worker. Management plans discussed with the patient, family and they are in agreement.  CODE STATUS: Full Code  TOTAL TIME TAKING CARE OF THIS PATIENT: 33 minutes.   More than 50% of the time was spent in counseling/coordination of care: YES  POSSIBLE D/C IN 1-2 DAYS, DEPENDING ON CLINICAL CONDITION.   Vaughan Basta M.D on 03/03/2019 at 3:13 PM  Between 7am to 6pm - Pager - [phone]  After 6pm go to [email or web address] - [facility]

## 2019-03-03 NOTE — Progress Notes (Signed)
Report called to [facility] on 1C, pt to transfer on 1C bed, on bedpan at this time.  VSS on 3 liters [place], chart belongings and meds to transfer with her

## 2019-03-03 NOTE — Progress Notes (Signed)
CRITICAL CARE NOTE      CHIEF COMPLAINT:   Acute hypoxemic respiratory failure   SUBJECTIVE    -Patient is improved overnight with diuresis and NIV support.  She had 3200cc urine output overnight.   -CXR this am for interval changes on pulmonary vascular congestion.    -Weaning FiO2 on BIPAP with goal >90%spO2  -BMP this am with stable GFR adjusted for age.  -Sitting up in bed eating lunch indepedently   Noe Gens (daughter and POA) came in to discuss care plan.   --Th  PAST MEDICAL HISTORY   Past Medical History:  Diagnosis Date  . CHF (congestive heart failure) ([place])   . Chronic kidney disease   . COPD (chronic obstructive pulmonary disease) ([place])   . Diabetes mellitus without complication ([place])   . Hypertension      SURGICAL HISTORY   Past Surgical History:  Procedure Laterality Date  . aoric valve replacemet      [employer] Jude  . CATARACT EXTRACTION, BILATERAL    . PACEMAKER PLACEMENT    . VALVE REPLACEMENT       FAMILY HISTORY   History reviewed. No pertinent family history.   SOCIAL HISTORY   Social History   Tobacco Use  . Smoking status: Never Smoker  . Smokeless tobacco: Never Used  Substance Use Topics  . Alcohol use: No  . Drug use: No     MEDICATIONS   Current Medication:  Current Facility-Administered Medications:  .  acetaminophen (TYLENOL) tablet 650 mg, 650 mg, Oral, Q6H PRN **OR** acetaminophen (TYLENOL) suppository 650 mg, 650 mg, Rectal, Q6H PRN, Demetrios Loll, MD .  albuterol (PROVENTIL) (2.5 MG/3ML) 0.083% nebulizer solution 2.5 mg, 2.5 mg, Nebulization, Q2H PRN, Demetrios Loll, MD .  aspirin EC tablet 81 mg, 81 mg, Oral, Daily, Demetrios Loll, MD, 81 mg at 03/03/19 0844 .  atorvastatin (LIPITOR) tablet 10 mg, 10 mg, Oral, q1800, Demetrios Loll, MD, 10 mg at  03/02/19 1618 .  bisacodyl (DULCOLAX) EC tablet 5 mg, 5 mg, Oral, Daily PRN, Demetrios Loll, MD .  carvedilol (COREG) tablet 25 mg, 25 mg, Oral, BID WC, Demetrios Loll, MD, 25 mg at 03/03/19 [phone] .  Chlorhexidine Gluconate Cloth 2 % PADS 6 each, 6 each, Topical, Daily, Ottie Glazier, MD, 6 each at 03/03/19 [phone] .  diltiazem (CARDIZEM CD) 24 hr capsule 240 mg, 240 mg, Oral, Daily, Demetrios Loll, MD, 240 mg at 03/03/19 0843 .  furosemide (LASIX) injection 80 mg, 80 mg, Intravenous, Q12H, Galan Ghee, MD, 80 mg at 03/03/19 0556 .  hydrALAZINE (APRESOLINE) injection 10 mg, 10 mg, Intravenous, Q6H PRN, Demetrios Loll, MD .  hydrALAZINE (APRESOLINE) tablet 25 mg, 25 mg, Oral, Q8H, Demetrios Loll, MD, 25 mg at 03/03/19 0555 .  lisinopril (ZESTRIL) tablet 20 mg, 20 mg, Oral, Daily, 20 mg at 03/03/19 0844 **AND** hydrochlorothiazide (MICROZIDE) capsule 12.5 mg, 12.5 mg, Oral, Daily, Demetrios Loll, MD, 12.5 mg at 03/03/19 0843 .  HYDROcodone-acetaminophen (NORCO/VICODIN) 5-325 MG per tablet 1-2 tablet, 1-2 tablet, Oral, Q4H PRN, Demetrios Loll, MD .  insulin aspart (novoLOG) injection 0-5 Units, 0-5 Units, Subcutaneous, QHS, Demetrios Loll, MD, 3 Units at 03/02/19 2140 .  insulin aspart (novoLOG) injection 0-9 Units, 0-9 Units, Subcutaneous, TID WC, Demetrios Loll, MD, 5 Units at 03/03/19 [phone] .  insulin aspart protamine- aspart (NOVOLOG MIX 70/30) injection 10 Units, 10 Units, Subcutaneous, Q supper, Demetrios Loll, MD, 10 Units at 03/02/19 1619 .  insulin aspart protamine- aspart (NOVOLOG MIX 70/30) injection 17  Units, 17 Units, Subcutaneous, Q breakfast, Demetrios Loll, MD, 17 Units at 03/03/19 [phone] .  ipratropium-albuterol (DUONEB) 0.5-2.5 (3) MG/3ML nebulizer solution 3 mL, 3 mL, Nebulization, Q15 min PRN, Blake Divine, MD, 3 mL at 03/01/19 1258 .  ipratropium-albuterol (DUONEB) 0.5-2.5 (3) MG/3ML nebulizer solution 3 mL, 3 mL, Nebulization, Q6H, Demetrios Loll, MD, 3 mL at 03/03/19 0849 .  ondansetron (ZOFRAN) tablet 4 mg, 4 mg, Oral, Q6H  PRN **OR** ondansetron (ZOFRAN) injection 4 mg, 4 mg, Intravenous, Q6H PRN, Demetrios Loll, MD .  pantoprazole (PROTONIX) EC tablet 40 mg, 40 mg, Oral, Daily, Demetrios Loll, MD, 40 mg at 03/03/19 0843 .  senna-docusate (Senokot-S) tablet 1 tablet, 1 tablet, Oral, QHS PRN, Demetrios Loll, MD .  warfarin (COUMADIN) tablet 7.5 mg, 7.5 mg, Oral, ONCE-1800, [place], Abby K, RPH .  Warfarin - Pharmacist Dosing Inpatient, , Does not apply, q1800, Tawnya Crook, [place]    ALLERGIES   Patient has no known allergies.    REVIEW OF SYSTEMS     10 point ROS conducted and is negative except for mild hypothermia reported by patient  PHYSICAL EXAMINATION   Vitals:   03/03/19 0800 03/03/19 0843  BP: 120/63 120/63  Pulse: 72   Resp: 20   Temp: 98.7 F (37.1 C)   SpO2: 99%     GENERAL mild distress due to shortness of breath HEAD: Normocephalic, atraumatic.  EYES: Pupils equal, round, reactive to light.  No scleral icterus.  MOUTH: Moist mucosal membrane. NECK: Supple. No thyromegaly. No nodules. No JVD.  PULMONARY: Decreased breath sounds bilaterally with crackles at the bases CARDIOVASCULAR: S1 and S2. Regular rate and rhythm. No murmurs, rubs, or gallops.  GASTROINTESTINAL: Soft, nontender, non-distended. No masses. Positive bowel sounds. No hepatosplenomegaly.  MUSCULOSKELETAL: No swelling, clubbing, or edema.  NEUROLOGIC: Mild distress due to acute illness SKIN:intact,warm,dry   PERTINENT DATA      Lines / Drains: Peripheral line  Cultures / Sepsis markers: Respiratory and blood  Antibiotics: Currently no antibiotics   Protocols / Consultants: None  Tests / Events: Transthoracic echo  Overnight: Patient will remain on BiPAP      Infusions:  Scheduled Medications: . aspirin EC  81 mg Oral Daily  . atorvastatin  10 mg Oral q1800  . carvedilol  25 mg Oral BID WC  . Chlorhexidine Gluconate Cloth  6 each Topical Daily  . diltiazem  240 mg Oral Daily  . furosemide   80 mg Intravenous Q12H  . hydrALAZINE  25 mg Oral Q8H  . lisinopril  20 mg Oral Daily   And  . hydrochlorothiazide  12.5 mg Oral Daily  . insulin aspart  0-5 Units Subcutaneous QHS  . insulin aspart  0-9 Units Subcutaneous TID WC  . insulin aspart protamine- aspart  10 Units Subcutaneous Q supper  . insulin aspart protamine- aspart  17 Units Subcutaneous Q breakfast  . ipratropium-albuterol  3 mL Nebulization Q6H  . pantoprazole  40 mg Oral Daily  . warfarin  7.5 mg Oral ONCE-1800  . Warfarin - Pharmacist Dosing Inpatient   Does not apply q1800   PRN Medications: acetaminophen **OR** acetaminophen, albuterol, bisacodyl, hydrALAZINE, HYDROcodone-acetaminophen, ipratropium-albuterol, ondansetron **OR** ondansetron (ZOFRAN) IV, senna-docusate Hemodynamic parameters:   Intake/Output: 08/26 0701 - 08/27 0700 In: -  Out: 900 [Urine:900]  Ventilator  Settings:        LAB RESULTS:  Basic Metabolic Panel: Recent Labs  Lab 03/01/19 1109 03/02/19 0351 03/03/19 0611  NA 140 140 140  K 4.1 4.3  4.8  CL 103 100 99  CO2 28 28 33*  GLUCOSE 145* 254* 278*  BUN 17 25* 42*  CREATININE 0.91 0.97 1.24*  CALCIUM 9.0 8.9 8.6*   Liver Function Tests: No results for input(s): AST, ALT, ALKPHOS, BILITOT, PROT, ALBUMIN in the last 168 hours. No results for input(s): LIPASE, AMYLASE in the last 168 hours. No results for input(s): AMMONIA in the last 168 hours. CBC: Recent Labs  Lab 03/01/19 1109 03/02/19 0351 03/03/19 0611  WBC 7.0 7.5 13.3*  NEUTROABS 4.9  --   --   HGB 10.7* 10.3* 10.0*  HCT 36.4 35.1* 34.0*  MCV 86.1 85.6 88.1  PLT 323 270 257   Cardiac Enzymes: No results for input(s): CKTOTAL, CKMB, CKMBINDEX, TROPONINI in the last 168 hours. BNP: Invalid input(s): POCBNP CBG: Recent Labs  Lab 03/02/19 0709 03/02/19 1127 03/02/19 1559 03/02/19 2055 03/03/19 0725  GLUCAP 237* 268* 267* 300* 257*     IMAGING RESULTS:  Imaging: Dg Chest Port 1 View  Result  Date: 03/02/2019 CLINICAL DATA:  Pulmonary disease, hypertension EXAM: PORTABLE CHEST 1 VIEW COMPARISON:  03/01/2019 FINDINGS: Bilateral interstitial and patchy alveolar airspace opacities. No pleural effusion or pneumothorax. Stable cardiomegaly. Prior CABG. Dual lead cardiac pacemaker. IMPRESSION: Mild CHF. Electronically Signed   By: Kathreen Devoid   On: 03/02/2019 08:31   Dg Chest Portable 1 View  Result Date: 03/01/2019 CLINICAL DATA:  Shortness of breath, history CHF, COPD, diabetes mellitus, hypertension EXAM: PORTABLE CHEST 1 VIEW COMPARISON:  Portable exam 1106 hours compared to 02/07/2019 FINDINGS: LEFT subclavian sequential transvenous pacemaker leads project at RIGHT atrium and RIGHT ventricle. Enlargement of cardiac silhouette with pulmonary vascular congestion. Atherosclerotic calcification aorta mild tortuosity. Scattered interstitial infiltrates consistent with mild pulmonary edema. No pleural effusion or gross pneumothorax, though assessment of the lung apices is limited by superimposition of the patient's chin. IMPRESSION: Mild CHF. Electronically Signed   By: Lavonia Dana M.D.   On: 03/01/2019 11:55         ASSESSMENT AND PLAN    -Multidisciplinary rounds held today  Acute Hypoxic Respiratory Failure -Due to acute decompensated diastolic CHF EF 60 to 123456 -continue Bronchodilator Therapy -Continue BiPAP -We will perform ABG if patient does not clinically improved by tomorrow morning     Acute decompensated diastolic CHF with EF 60 to 65% -oxygen as needed -Lasix 80 mg IV twice daily -1200 cc fluid restriction per 24 hours -Continue NIV support ICU monitoring     Renal Failure-chronic stage I -follow chem 7 -follow UO -continue Foley Catheter-assess need daily    ID -continue IV abx as prescibed -follow up cultures  GI/Nutrition GI PROPHYLAXIS as indicated DIET-->TF's as tolerated Constipation protocol as indicated  ENDO - ICU  hypoglycemic\Hyperglycemia protocol -check FSBS per protocol   ELECTROLYTES -follow labs as needed -replace as needed -pharmacy consultation   DVT/GI PRX ordered -SCDs  TRANSFUSIONS AS NEEDED MONITOR FSBS ASSESS the need for LABS as needed   Critical care provider statement:    Critical care time (minutes):  32   Critical care time was exclusive of:  Separately billable procedures and treating other patients   Critical care was necessary to treat or prevent imminent or life-threatening deterioration of the following conditions:   Acute hypoxemic respiratory failure, acute decompensated diastolic heart failure, obstructive sleep apnea, diabetes, essential hypertension, morbid obesity, multiple comorbid conditions   Critical care was time spent personally by me on the following activities:  Development of treatment plan with patient  or surrogate, discussions with consultants, evaluation of patient's response to treatment, examination of patient, obtaining history from patient or surrogate, ordering and performing treatments and interventions, ordering and review of laboratory studies and re-evaluation of patient's condition.  I assumed direction of critical care for this patient from another provider in my specialty: no    This document was prepared using Dragon voice recognition software and may include unintentional dictation errors.    Ottie Glazier, M.D.  Division of [place]

## 2019-03-03 NOTE — Progress Notes (Signed)
Daily Progress Note   Patient Name: Kimberly Peterson       Date: 03/03/2019 DOB: 16-Apr-1930  Age: 85 y.o. MRN#: [id number] Attending Physician: Vaughan Basta, * Primary Care Physician: [name], MD Admit Date: 03/01/2019  Reason for Consultation/Follow-up: Establishing goals of care  Subjective: Patient in bed sleeping. Easily aroused with verbal stimuli. Denies pain or shortness of breath. Reports other than weakness, she feels much better. Continues to tolerate nasal cannula.   Briefly reviewed goals of care discussion form yesterday. Kimberly Peterson continues to request outpatient palliative support. She is aware a referral has been placed for services once she is discharged back to home.   Length of Stay: 2  Current Medications: Scheduled Meds:  . aspirin EC  81 mg Oral Daily  . atorvastatin  10 mg Oral q1800  . bisacodyl  10 mg Rectal Once  . carvedilol  25 mg Oral BID WC  . Chlorhexidine Gluconate Cloth  6 each Topical Daily  . diltiazem  240 mg Oral Daily  . furosemide  80 mg Intravenous Q12H  . hydrALAZINE  25 mg Oral Q8H  . lisinopril  20 mg Oral Daily   And  . hydrochlorothiazide  12.5 mg Oral Daily  . insulin aspart  0-5 Units Subcutaneous QHS  . insulin aspart  0-9 Units Subcutaneous TID WC  . insulin aspart protamine- aspart  10 Units Subcutaneous Q supper  . insulin aspart protamine- aspart  17 Units Subcutaneous Q breakfast  . ipratropium-albuterol  3 mL Nebulization Q6H  . pantoprazole  40 mg Oral Daily  . warfarin  7.5 mg Oral ONCE-1800  . Warfarin - Pharmacist Dosing Inpatient   Does not apply q1800    Continuous Infusions:   PRN Meds: acetaminophen **OR** acetaminophen, albuterol, bisacodyl, hydrALAZINE, HYDROcodone-acetaminophen,  ipratropium-albuterol, ondansetron **OR** ondansetron (ZOFRAN) IV, senna-docusate  Physical Exam         -awake, A&O x3, chronically-ill -RRR, diminished bases  Vital Signs: BP (!) 135/59   Pulse 70   Temp 98.7 F (37.1 C) (Temporal)   Resp 17   Ht 5\' 2"  (1.575 m)   Wt 96.2 kg   SpO2 99%   BMI 38.79 kg/m  SpO2: SpO2: 99 % O2 Device: O2 Device: Nasal Cannula O2 Flow Rate: O2 Flow Rate (L/min): 3 L/min  Intake/output  summary:   Intake/Output Summary (Last 24 hours) at 03/03/2019 1125 Last data filed at 03/03/2019 0900 Gross per 24 hour  Intake 360 ml  Output 2125 ml  Net -1765 ml   LBM: Last BM Date: 02/28/19 Baseline Weight: Weight: 90.7 kg Most recent weight: Weight: 96.2 kg       Palliative Assessment/Data: PPS 30%      Patient Active Problem List   Diagnosis Date Noted  . Acute and chronic respiratory failure with hypoxia ([place]) 03/01/2019  . Acute respiratory failure with hypoxia ([place]) 02/07/2019  . Type 2 diabetes with peripheral circulatory disorder, controlled ([place]) 10/17/2018  . Complete heart block ([place]) 09/09/2018  . Severe aortic valve stenosis 09/09/2018  . Lymphedema 08/12/2018  . Uncontrolled type 2 diabetes mellitus with hyperglycemia ([place]) 02/25/2018  . Atrial fibrillation ([place]) 02/25/2018  . Encounter for current long-term use of anticoagulants 02/25/2018  . Dependence on supplemental oxygen 02/25/2018  . Lower extremity edema 02/25/2018  . CHF (congestive heart failure) ([place]) 12/04/2015  . Diabetes mellitus ([place]) 12/04/2015  . Chronic obstructive pulmonary disease ([place]) 09/20/2015  . Diastolic CHF, acute on chronic ([facility]) 09/19/2015  . Benign essential HTN 09/19/2015  . Controlled diabetes mellitus without complication, with long-term current use of insulin ([place]) 09/19/2015  . Hyperlipidemia, mixed 09/04/2014  . Pulmonary embolism ([place]) 09/23/2013    Palliative Care Assessment & Plan   Patient Profile: Palliative Care consult requested  for this 85 y.o. female with multiple medical problems including chronic respiratory failure, diastolic CHF (EF 123456), COPD (2L home oxygen), CKD II, hypertension, , aortic valve disease s/p valve replacement ([place] 2010), atrial fibrillation (coumadin), complete AV block s/p pacemakre, and diabetes. She presented to ED with complaints of worsening shortness of breath and cough. She was recently admitted for similar complaints and treated for CHF exacerbation. On admission she denied pain but endorsed bilateral lower extremity edema. She required BiPAP use for oxygen support and hypoxia. Chest x-ray showed CHF congestion and she is being treated with IV lasix. COVID negative on previous admission (02/06/19) with pending results. BNP 521 on admission and has increased to 785. Since admission she continues to require intermittent BiPAP support. PMT consulted for goals of care discussion.   Recommendations/Plan:  Full Code  Continue current plan of care per attending  Outpatient palliative support at discharge  PMT will continue to support and follow as needed. Please call.   Goals of Care and Additional Recommendations:  Limitations on Scope of Treatment: Full Scope Treatment, No Artificial Feeding, No Hemodialysis and No Tracheostomy  Code Status:    Code Status Orders  (From admission, onward)         Start     Ordered   03/01/19 1517  Full code  Continuous     03/01/19 1516        Code Status History    Date Active Date Inactive Code Status Order ID Comments User Context   02/07/2019 1126 02/09/2019 1939 Full Code [id number]  Lang Snow, NP ED   08/02/2018 1745 08/05/2018 1704 Full Code [id number]  Fritzi Mandes, MD Inpatient   12/04/2015 0346 12/06/2015 1852 Full Code [id number]  Quintella Baton, MD Inpatient   09/19/2015 1427 09/21/2015 2113 Full Code [id number]  Robbie Lis, MD Inpatient   09/23/2013 1544 09/29/2013 1741 Full Code [id number]  Rush Farmer, MD ED   Advance Care  Planning Activity    Advance Directive Documentation     Most Recent Value  Type of Advance  Research scientist (medical)  Pre-existing out of facility DNR order (yellow form or pink MOST form)  -  "MOST" Form in Place?  -       Prognosis:  Guarded  Discharge Planning:  Home with Home Health and outpatient Palliative.   Care plan was discussed with patient.   Thank you for allowing the Palliative Medicine Team to assist in the care of this patient.   Total Time: 20 min.   Greater than 50%  of this time was spent counseling and coordinating care related to the above assessment and plan.  Alda Lea, AGPCNP-BC Palliative Medicine Team    Please contact Palliative Medicine Team phone at [phone] for questions and concerns.

## 2019-03-03 NOTE — Telephone Encounter (Signed)
CMN SIGNED AND PLACED IN [facility]. °

## 2019-03-03 NOTE — Consult Note (Signed)
ANTICOAGULATION CONSULT NOTE - Initial Consult  Pharmacy Consult for Warfarin Dosing Indication: atrial fibrillation  No Known Allergies  Patient Measurements: Height: 5\' 2"  (157.5 cm) Weight: 212 lb 1.3 oz (96.2 kg) IBW/kg (Calculated) : 50.1  Vital Signs: Temp: 98.7 F (37.1 C) (08/27 0800) Temp Source: Temporal (08/27 0800) BP: 120/63 (08/27 0843) Pulse Rate: 72 (08/27 0800)  Labs: Recent Labs    03/01/19 1109 03/01/19 1323 03/02/19 0351 03/03/19 0611  HGB 10.7*  --  10.3* 10.0*  HCT 36.4  --  35.1* 34.0*  PLT 323  --  270 257  APTT 51*  --   --   --   LABPROT 47.4*  --  22.3* 17.0*  INR 5.3*  --  2.0* 1.4*  CREATININE 0.91  --  0.97 1.24*  TROPONINIHS 33* 67*  --   --     Estimated Creatinine Clearance: 34.6 mL/min (A) (by C-G formula based on SCr of 1.24 mg/dL (H)).   Medical History: Past Medical History:  Diagnosis Date  . CHF (congestive heart failure) ([place])   . Chronic kidney disease   . COPD (chronic obstructive pulmonary disease) ([place])   . Diabetes mellitus without complication ([place])   . Hypertension     Medications:  Medications Prior to Admission  Medication Sig Dispense Refill Last Dose  . aspirin 81 MG tablet Take 81 mg by mouth daily.   02/28/2019 at Unknown time  . atorvastatin (LIPITOR) 10 MG tablet TAKE 1 TABLET BY MOUTH ONCE DAILY FOR CHOLESTEROL 90 tablet 3 02/28/2019 at Unknown time  . carvedilol (COREG) 25 MG tablet Take 1 tablet by mouth twice daily 180 tablet 0 02/28/2019 at Unknown time  . diltiazem (CARDIZEM CD) 240 MG 24 hr capsule Take 240 mg by mouth daily.   02/28/2019 at Unknown time  . furosemide (LASIX) 40 MG tablet TAKE 1 TABLET BY MOUTH ONCE DAILY FOR  FLUID  MAY  TAKE  1/2  TABLET  EXTRA  IF  NEEDED 120 tablet 0 02/28/2019 at Unknown time  . hydrALAZINE (APRESOLINE) 25 MG tablet TAKE 1 & 1/2 (ONE & ONE-HALF) TABLETS BY MOUTH THREE TIMES DAILY FOR BLOOD PRESSURE 405 tablet 0 02/28/2019 at Unknown time  . insulin aspart protamine-  aspart (NOVOLOG MIX 70/30) (70-30) 100 UNIT/ML injection Inject 21-34 Units into the skin 2 (two) times daily with a meal. 34 units in morning and 21 units in the evening   02/28/2019 at Unknown time  . lisinopril-hydrochlorothiazide (ZESTORETIC) 20-12.5 MG tablet Take 1 tablet by mouth daily.   02/28/2019 at Unknown time  . pantoprazole (PROTONIX) 40 MG tablet Take 40 mg by mouth daily.   02/28/2019 at Unknown time  . warfarin (COUMADIN) 5 MG tablet TAKE ONE TABLET BY MOUTH ONCE DAILY AT 5-6PM AND TAKE ONE AND ONE-HALF TABLET BY MOUTH ON THURSDAY 90 tablet 4 Past Week at Unknown time   Scheduled:  . aspirin EC  81 mg Oral Daily  . atorvastatin  10 mg Oral q1800  . carvedilol  25 mg Oral BID WC  . Chlorhexidine Gluconate Cloth  6 each Topical Daily  . diltiazem  240 mg Oral Daily  . furosemide  80 mg Intravenous Q12H  . hydrALAZINE  25 mg Oral Q8H  . lisinopril  20 mg Oral Daily   And  . hydrochlorothiazide  12.5 mg Oral Daily  . insulin aspart  0-5 Units Subcutaneous QHS  . insulin aspart  0-9 Units Subcutaneous TID WC  . insulin aspart protamine-  aspart  10 Units Subcutaneous Q supper  . insulin aspart protamine- aspart  17 Units Subcutaneous Q breakfast  . ipratropium-albuterol  3 mL Nebulization Q6H  . pantoprazole  40 mg Oral Daily  . warfarin  7.5 mg Oral ONCE-1800  . Warfarin - Pharmacist Dosing Inpatient   Does not apply q1800   Infusions:   PRN: acetaminophen **OR** acetaminophen, albuterol, bisacodyl, hydrALAZINE, HYDROcodone-acetaminophen, ipratropium-albuterol, ondansetron **OR** ondansetron (ZOFRAN) IV, senna-docusate Anti-infectives (From admission, onward)   None      Assessment: Pharmacy has been consulted for Warfarin therapy in 85yo patient admitted with acute and chronic respiratory failure with hypoxia. Patient takes Warfarin 5mg  everyday but Thursday, when she takes 7.5mg . Patient stated that she took her last dose 8/24. Appears patient is typically within  therapeutic range with INR.  Date INR Dose 8/25 5.3 Vit K 5 mg 8/26 2 Refused 8/27 1.4  Goal of Therapy:  INR 2-3 Monitor platelets by anticoagulation protocol: Yes   Plan:  INR 1.4 subtherapeutic. Patient refused dose of warfarin last night. Per patient and daughter, on Monday her doctor told her to hold her dose of warfarin for two days because her INR was 6. Patient presented with INR 5.3 and received Vit K which has subsequently decreased her INR. Will continue home dose of 7.5 mg tonight.  Tawnya Crook, PharmD 03/03/2019,10:42 AM

## 2019-03-04 LAB — BASIC METABOLIC PANEL
Anion gap: 7 (ref 5–15)
BUN: 51 mg/dL — ABNORMAL HIGH (ref 8–23)
CO2: 33 mmol/L — ABNORMAL HIGH (ref 22–32)
Calcium: 8.4 mg/dL — ABNORMAL LOW (ref 8.9–10.3)
Chloride: 99 mmol/L (ref 98–111)
Creatinine, Ser: 1.6 mg/dL — ABNORMAL HIGH (ref 0.44–1.00)
GFR calc Af Amer: 33 mL/min — ABNORMAL LOW (ref >60)
GFR calc non Af Amer: 29 mL/min — ABNORMAL LOW (ref >60)
Glucose, Bld: 180 mg/dL — ABNORMAL HIGH (ref 70–99)
Potassium: 4.4 mmol/L (ref 3.5–5.1)
Sodium: 139 mmol/L (ref 135–145)

## 2019-03-04 LAB — CBC WITH DIFFERENTIAL/PLATELET
Abs Immature Granulocytes: 0.05 10*3/uL (ref 0.00–0.07)
Basophils Absolute: 0.1 10*3/uL (ref 0.0–0.1)
Basophils Relative: 1 %
Eosinophils Absolute: 0.2 10*3/uL (ref 0.0–0.5)
Eosinophils Relative: 2 %
HCT: 33.2 % — ABNORMAL LOW (ref 36.0–46.0)
Hemoglobin: 9.7 g/dL — ABNORMAL LOW (ref 12.0–15.0)
Immature Granulocytes: 1 %
Lymphocytes Relative: 20 %
Lymphs Abs: 2 10*3/uL (ref 0.7–4.0)
MCH: 25.2 pg — ABNORMAL LOW (ref 26.0–34.0)
MCHC: 29.2 g/dL — ABNORMAL LOW (ref 30.0–36.0)
MCV: 86.2 fL (ref 80.0–100.0)
Monocytes Absolute: 1 10*3/uL (ref 0.1–1.0)
Monocytes Relative: 10 %
Neutro Abs: 6.7 10*3/uL (ref 1.7–7.7)
Neutrophils Relative %: 66 %
Platelets: 234 10*3/uL (ref 150–400)
RBC: 3.85 MIL/uL — ABNORMAL LOW (ref 3.87–5.11)
RDW: 15.5 % (ref 11.5–15.5)
WBC: 9.9 10*3/uL (ref 4.0–10.5)
nRBC: 0 % (ref 0.0–0.2)

## 2019-03-04 LAB — GLUCOSE, CAPILLARY
Glucose-Capillary: 176 mg/dL — ABNORMAL HIGH (ref 70–99)
Glucose-Capillary: 222 mg/dL — ABNORMAL HIGH (ref 70–99)
Glucose-Capillary: 231 mg/dL — ABNORMAL HIGH (ref 70–99)
Glucose-Capillary: 146 mg/dL — ABNORMAL HIGH (ref 70–99)

## 2019-03-04 LAB — PROTIME-INR
INR: 1.8 — ABNORMAL HIGH (ref 0.8–1.2)
Prothrombin Time: 20.4 seconds — ABNORMAL HIGH (ref 11.4–15.2)

## 2019-03-04 MED ORDER — IPRATROPIUM-ALBUTEROL 0.5-2.5 (3) MG/3ML IN SOLN
3.0000 mL | Freq: Three times a day (TID) | RESPIRATORY_TRACT | Status: DC
  Administered 2019-03-04 – 2019-03-05 (×5): 3 mL via RESPIRATORY_TRACT
  Filled 2019-03-04 (×5): qty 3

## 2019-03-04 MED ORDER — INSULIN ASPART PROT & ASPART (70-30 MIX) 100 UNIT/ML ~~LOC~~ SUSP
23.0000 [IU] | Freq: Every day | SUBCUTANEOUS | Status: DC
  Administered 2019-03-05: 09:00:00 23 [IU] via SUBCUTANEOUS
  Filled 2019-03-04: qty 10

## 2019-03-04 MED ORDER — CARVEDILOL 6.25 MG PO TABS
6.2500 mg | ORAL_TABLET | Freq: Two times a day (BID) | ORAL | Status: DC
  Administered 2019-03-05: 6.25 mg via ORAL
  Filled 2019-03-04 (×3): qty 1
  Filled 2019-03-04: qty 2

## 2019-03-04 MED ORDER — INSULIN ASPART PROT & ASPART (70-30 MIX) 100 UNIT/ML ~~LOC~~ SUSP
14.0000 [IU] | Freq: Every day | SUBCUTANEOUS | Status: DC
  Administered 2019-03-04: 17:00:00 14 [IU] via SUBCUTANEOUS
  Filled 2019-03-04: qty 10

## 2019-03-04 MED ORDER — WARFARIN SODIUM 5 MG PO TABS
5.0000 mg | ORAL_TABLET | Freq: Once | ORAL | Status: AC
  Administered 2019-03-04: 17:00:00 5 mg via ORAL
  Filled 2019-03-04: qty 1

## 2019-03-04 NOTE — Progress Notes (Signed)
[facility] [place] NAME: Kimberly Peterson    MR#:  [id number]  DATE OF BIRTH:  04-20-1930  SUBJECTIVE:  CHIEF COMPLAINT:   Chief Complaint  Patient presents with  . Shortness of Breath   Better shortness of breath and cough,  Had good diuresis. Off Bipap now. On medical floor, ambulates in room, renal func slight worse.  REVIEW OF SYSTEMS:  Review of Systems  Constitutional: Positive for malaise/fatigue. Negative for chills and fever.  HENT: Negative for sore throat.   Eyes: Negative for blurred vision and double vision.  Respiratory: Positive for cough and shortness of breath. Negative for hemoptysis, sputum production, wheezing and stridor.   Cardiovascular: Positive for leg swelling. Negative for chest pain, palpitations and orthopnea.  Gastrointestinal: Negative for abdominal pain, blood in stool, diarrhea, melena, nausea and vomiting.  Genitourinary: Negative for dysuria, flank pain and hematuria.  Musculoskeletal: Negative for back pain and joint pain.  Skin: Negative for rash.  Neurological: Negative for dizziness, sensory change, focal weakness, seizures, loss of consciousness, weakness and headaches.  Endo/Heme/Allergies: Negative for polydipsia.  Psychiatric/Behavioral: Negative for depression. The patient is not nervous/anxious.     DRUG ALLERGIES:  No Known Allergies VITALS:  Blood pressure (!) 106/49, pulse 73, temperature 98.3 F (36.8 C), resp. rate 19, height 5\' 2"  (1.575 m), weight 96.2 kg, SpO2 95 %. PHYSICAL EXAMINATION:  Physical Exam Constitutional:      General: She is not in acute distress. HENT:     Head: Normocephalic.     Mouth/Throat:     Mouth: Mucous membranes are moist.  Eyes:     General: No scleral icterus.    Conjunctiva/sclera: Conjunctivae normal.     Pupils: Pupils are equal, round, and reactive to light.  Neck:     Musculoskeletal: Normal range of motion and neck supple.     Vascular: No  JVD.     Trachea: No tracheal deviation.  Cardiovascular:     Rate and Rhythm: Normal rate and regular rhythm.     Heart sounds: Normal heart sounds. No murmur. No gallop.   Pulmonary:     Effort: Pulmonary effort is normal. No respiratory distress.     Breath sounds: No stridor. Rales present. No wheezing or rhonchi.  Abdominal:     General: Bowel sounds are normal. There is no distension.     Palpations: Abdomen is soft.     Tenderness: There is no abdominal tenderness. There is no rebound.  Musculoskeletal: Normal range of motion.        General: No tenderness.     Right lower leg: No edema.     Left lower leg: No edema.  Skin:    Findings: No erythema or rash.  Neurological:     General: No focal deficit present.     Mental Status: She is alert and oriented to person, place, and time.     Cranial Nerves: No cranial nerve deficit.  Psychiatric:        Mood and Affect: Mood normal.    LABORATORY PANEL:  Female CBC Recent Labs  Lab 03/04/19 0337  WBC 9.9  HGB 9.7*  HCT 33.2*  PLT 234   ------------------------------------------------------------------------------------------------------------------ Chemistries  Recent Labs  Lab 03/04/19 0337  NA 139  K 4.4  CL 99  CO2 33*  GLUCOSE 180*  BUN 51*  CREATININE 1.60*  CALCIUM 8.4*   RADIOLOGY:  No results found. ASSESSMENT AND PLAN:   Acute  on chronic respiratory failure with hypoxia due to acute on chronic diastolic CHF.  admitted to stepdown unit. Continue BiPAP, DuoNeb every 6 hours, continue home nebulizer.  off Bipap, moved to floor.  Acute on chronic diastolic CHF. Continue CHF protocol, Lasix is increased to 80 mg IV twice daily, continue lisinopril Echo this month reported The left ventricle has normal systolic function with an ejection fraction of 60-65%  improved, decrease lasix now.  COPD.  Continue home nebulizer.  Hypertension.  Continue home hypertension medication. Diabetes.  Start  sliding scale.  Continue home NovoLog 70/30. Ac renal failure on CKD stage II.   Follow-up BMP while on Lasix. Some worsening, so will monitor and holding lasix.  All the records are reviewed and case discussed with Care Management/Social Worker. Management plans discussed with the patient, family and they are in agreement.  CODE STATUS: Full Code  TOTAL TIME TAKING CARE OF THIS PATIENT: 33 minutes.   More than 50% of the time was spent in counseling/coordination of care: YES  POSSIBLE D/C IN 1-2 DAYS, DEPENDING ON CLINICAL CONDITION.   Vaughan Basta M.D on 03/04/2019 at 2:26 PM  Between 7am to 6pm - Pager - [phone]  After 6pm go to [email or web address] - [facility]

## 2019-03-04 NOTE — Care Management Important Message (Signed)
Important Message  Patient Details  Name: Kimberly Peterson MRN: [id number] Date of Birth: 02-Dec-1929   Medicare Important Message Given:  Yes     Juliann Pulse A Elvie Maines 03/04/2019, 10:54 AM

## 2019-03-04 NOTE — Consult Note (Signed)
ANTICOAGULATION CONSULT NOTE - Initial Consult  Pharmacy Consult for Warfarin Dosing Indication: atrial fibrillation  No Known Allergies  Patient Measurements: Height: 5\' 2"  (157.5 cm) Weight: 212 lb 1.3 oz (96.2 kg) IBW/kg (Calculated) : 50.1  Vital Signs: Temp: 98.3 F (36.8 C) (08/28 0307) BP: 105/53 (08/28 0620) Pulse Rate: 72 (08/28 0620)  Labs: Recent Labs    03/01/19 1109 03/01/19 1323 03/02/19 0351 03/03/19 0611 03/04/19 0337  HGB 10.7*  --  10.3* 10.0* 9.7*  HCT 36.4  --  35.1* 34.0* 33.2*  PLT 323  --  270 257 234  APTT 51*  --   --   --   --   LABPROT 47.4*  --  22.3* 17.0* 20.4*  INR 5.3*  --  2.0* 1.4* 1.8*  CREATININE 0.91  --  0.97 1.24* 1.60*  TROPONINIHS 33* 67*  --   --   --     Estimated Creatinine Clearance: 26.8 mL/min (A) (by C-G formula based on SCr of 1.6 mg/dL (H)).   Medical History: Past Medical History:  Diagnosis Date  . CHF (congestive heart failure) ([place])   . Chronic kidney disease   . COPD (chronic obstructive pulmonary disease) ([place])   . Diabetes mellitus without complication ([place])   . Hypertension     Medications:  Medications Prior to Admission  Medication Sig Dispense Refill Last Dose  . aspirin 81 MG tablet Take 81 mg by mouth daily.   02/28/2019 at Unknown time  . atorvastatin (LIPITOR) 10 MG tablet TAKE 1 TABLET BY MOUTH ONCE DAILY FOR CHOLESTEROL 90 tablet 3 02/28/2019 at Unknown time  . carvedilol (COREG) 25 MG tablet Take 1 tablet by mouth twice daily 180 tablet 0 02/28/2019 at Unknown time  . diltiazem (CARDIZEM CD) 240 MG 24 hr capsule Take 240 mg by mouth daily.   02/28/2019 at Unknown time  . furosemide (LASIX) 40 MG tablet TAKE 1 TABLET BY MOUTH ONCE DAILY FOR  FLUID  MAY  TAKE  1/2  TABLET  EXTRA  IF  NEEDED 120 tablet 0 02/28/2019 at Unknown time  . hydrALAZINE (APRESOLINE) 25 MG tablet TAKE 1 & 1/2 (ONE & ONE-HALF) TABLETS BY MOUTH THREE TIMES DAILY FOR BLOOD PRESSURE 405 tablet 0 02/28/2019 at Unknown time  .  insulin aspart protamine- aspart (NOVOLOG MIX 70/30) (70-30) 100 UNIT/ML injection Inject 21-34 Units into the skin 2 (two) times daily with a meal. 34 units in morning and 21 units in the evening   02/28/2019 at Unknown time  . lisinopril-hydrochlorothiazide (ZESTORETIC) 20-12.5 MG tablet Take 1 tablet by mouth daily.   02/28/2019 at Unknown time  . pantoprazole (PROTONIX) 40 MG tablet Take 40 mg by mouth daily.   02/28/2019 at Unknown time  . warfarin (COUMADIN) 5 MG tablet TAKE ONE TABLET BY MOUTH ONCE DAILY AT 5-6PM AND TAKE ONE AND ONE-HALF TABLET BY MOUTH ON THURSDAY 90 tablet 4 Past Week at Unknown time   Scheduled:  . aspirin EC  81 mg Oral Daily  . atorvastatin  10 mg Oral q1800  . carvedilol  25 mg Oral BID WC  . Chlorhexidine Gluconate Cloth  6 each Topical Daily  . diltiazem  240 mg Oral Daily  . furosemide  40 mg Intravenous Q12H  . hydrALAZINE  25 mg Oral Q8H  . insulin aspart  0-5 Units Subcutaneous QHS  . insulin aspart  0-9 Units Subcutaneous TID WC  . insulin aspart protamine- aspart  10 Units Subcutaneous Q supper  . insulin  aspart protamine- aspart  17 Units Subcutaneous Q breakfast  . ipratropium-albuterol  3 mL Nebulization TID  . pantoprazole  40 mg Oral Daily  . Warfarin - Pharmacist Dosing Inpatient   Does not apply q1800   Infusions:   PRN: acetaminophen **OR** acetaminophen, albuterol, bisacodyl, hydrALAZINE, HYDROcodone-acetaminophen, ipratropium-albuterol, ondansetron **OR** ondansetron (ZOFRAN) IV, senna-docusate Anti-infectives (From admission, onward)   None      Assessment: Pharmacy has been consulted for Warfarin therapy in 85yo patient admitted with acute and chronic respiratory failure with hypoxia. Patient takes Warfarin 5mg  everyday but Thursday, when she takes 7.5mg . Patient stated that she took her last dose 8/24. Appears patient is typically within therapeutic range with INR.  Date INR Dose 8/25 5.3 Vit K 5 mg 8/26 2 Refused 8/27 1.4        Warfarin 7.5mg  8/28     1.8  Goal of Therapy:  INR 2-3 Monitor platelets by anticoagulation protocol: Yes   Plan:  Will give home dose of 5mg  tonight and recheck INR with am labs.    Will check CBC a minimum of every 3 days per protocol.  Lu Duffel, PharmD, BCPS Clinical Pharmacist 03/04/2019 7:26 AM

## 2019-03-04 NOTE — Evaluation (Signed)
Physical Therapy Evaluation Patient Details Name: Kimberly Peterson MRN: [id number] DOB: 1930/04/16 Today's Date: 03/04/2019   History of Present Illness  pt presented to ED with complaints of shortness of breath and cough worsening over the last 3-4 days, chest xray show CHF congestion. PMH: CHF, CKD, COPD, DM, HTN, on home oxygen 2L  Clinical Impression  Pt is a 85 yo female admitted for above. Pt in recliner upon arrival and agreed to participate with PT. Pt grossly 4/5 strength BUE and BLE. Pt required min guard to min A for functional mobility tasks due to mild unsteadiness upon rising and for RW management. Pt required cuing for hand placement with STS transfers with minimal carryover. Pt performed therex requiring min cuing for correct performance however SpO2 frequently dropped to 90% after seated leg exercises on 2L O2 via nasal cannula. Pt ambulated to/from end of bed with RW and reported not wanting to do any more ambulation because she was tired. Pt fatigues quickly which limits her ambulation/activity tolerance. Pt reports hardly going out into the community.  Pt presents with decreased strength, endurance, balance and activity tolerance and would benefit from skilled acute therapy to improve deficits. Pt would benefit from home health PT following discharge from hospital to further improve deficits and allow for improved independence.    Follow Up Recommendations Home health PT    Equipment Recommendations  Rolling walker with 5" wheels;3in1 (PT)    Recommendations for Other Services       Precautions / Restrictions Precautions Precautions: Fall Restrictions Weight Bearing Restrictions: No      Mobility  Bed Mobility               General bed mobility comments: pt in recliner upon arrival  Transfers Overall transfer level: Needs assistance Equipment used: Rolling walker (2 wheeled) Transfers: Sit to/from Stand Sit to Stand: Min assist         General transfer  comment: min assist for initial rise, however progressed to min guard to rise later in session, VC for hand placement   Ambulation/Gait Ambulation/Gait assistance: Min guard Gait Distance (Feet): 10 Feet Assistive device: Rolling walker (2 wheeled) Gait Pattern/deviations: Decreased stride length;Step-through pattern;Trunk flexed;Wide base of support Gait velocity: decreased   General Gait Details: pt ambulated in room limited by length of O2 and pt reporting fatigue, pt reliant on walker with poor walker management at times  Stairs            Wheelchair Mobility    Modified Rankin (Stroke Patients Only)       Balance Overall balance assessment: Needs assistance Sitting-balance support: Feet supported Sitting balance-Leahy Scale: Fair     Standing balance support: Bilateral upper extremity supported;During functional activity Standing balance-Leahy Scale: Poor Standing balance comment: requires UE support of RW for ambulation and sit to stand, slight unsteadiness with initial standing and initial standing marching                             Pertinent Vitals/Pain Pain Assessment: No/denies pain    Home Living Family/patient expects to be discharged to:: Private residence Living Arrangements: Children Available Help at Discharge: Family;Available PRN/intermittently Type of Home: House Home Access: Level entry     Home Layout: One level Home Equipment: Walker - 4 wheels;Shower seat - built in;Cane - single point      Prior Function Level of Independence: Independent with assistive device(s)  Comments: pt reports ind with ADLs and household ambulation with rollator, on home O2 2L, denies any falls in the last year, pt reports she doesn't go out into the community     Hand Dominance        Extremity/Trunk Assessment   Upper Extremity Assessment Upper Extremity Assessment: Overall WFL for tasks assessed(grossly 4-/5)    Lower  Extremity Assessment Lower Extremity Assessment: Overall WFL for tasks assessed(grossly 4-/5)       Communication   Communication: HOH  Cognition Arousal/Alertness: Awake/alert Behavior During Therapy: WFL for tasks assessed/performed Overall Cognitive Status: Within Functional Limits for tasks assessed                                        General Comments      Exercises Total Joint Exercises Hip ABduction/ADduction: AROM;Strengthening;Both;15 reps;Seated(against therapist resistance) Long Arc Quad: AROM;Both;10 reps Marching in Standing: AROM;Both;10 reps;Standing;Seated General Exercises - Lower Extremity Toe Raises: AROM;Both;10 reps;Seated Heel Raises: AROM;Both;10 reps;Seated Other Exercises Other Exercises: sit to stand from recliner with RW x10, required close guarding for safety, pt reported it was easy after, SpO2 90% after 10 on 2L O2   Assessment/Plan    PT Assessment Patient needs continued PT services  PT Problem List Decreased strength;Decreased activity tolerance;Decreased balance;Decreased mobility;Decreased knowledge of use of DME;Obesity;Cardiopulmonary status limiting activity;Decreased knowledge of precautions;Decreased safety awareness       PT Treatment Interventions DME instruction;Gait training;Functional mobility training;Therapeutic activities;Therapeutic exercise;Balance training;Patient/family education;Neuromuscular re-education    PT Goals (Current goals can be found in the Care Plan section)  Acute Rehab PT Goals Patient Stated Goal: to return home PT Goal Formulation: With patient Time For Goal Achievement: 03/10/19 Potential to Achieve Goals: Good    Frequency Min 2X/week   Barriers to discharge        Co-evaluation               AM-PAC PT "6 Clicks" Mobility  Outcome Measure Help needed turning from your back to your side while in a flat bed without using bedrails?: None Help needed moving from lying on  your back to sitting on the side of a flat bed without using bedrails?: None Help needed moving to and from a bed to a chair (including a wheelchair)?: A Little Help needed standing up from a chair using your arms (e.g., wheelchair or bedside chair)?: A Little Help needed to walk in hospital room?: A Little Help needed climbing 3-5 steps with a railing? : A Lot 6 Click Score: 19    End of Session Equipment Utilized During Treatment: Gait belt Activity Tolerance: Patient tolerated treatment well;Patient limited by fatigue Patient left: in chair;with call bell/phone within reach;with chair alarm set Nurse Communication: Mobility status PT Visit Diagnosis: Difficulty in walking, not elsewhere classified (R26.2);Muscle weakness (generalized) (M62.81);Unsteadiness on feet (R26.81)    Time: 1006-1040 PT Time Calculation (min) (ACUTE ONLY): 34 min   Charges:   PT Evaluation $PT Eval Low Complexity: 1 Low PT Treatments $Therapeutic Exercise: 8-22 mins        Nhia Heaphy PT, DPT 11:20 AM,03/04/19 [phone]

## 2019-03-05 LAB — BASIC METABOLIC PANEL
Anion gap: 9 (ref 5–15)
BUN: 50 mg/dL — ABNORMAL HIGH (ref 8–23)
CO2: 35 mmol/L — ABNORMAL HIGH (ref 22–32)
Calcium: 8.7 mg/dL — ABNORMAL LOW (ref 8.9–10.3)
Chloride: 97 mmol/L — ABNORMAL LOW (ref 98–111)
Creatinine, Ser: 1.28 mg/dL — ABNORMAL HIGH (ref 0.44–1.00)
GFR calc Af Amer: 44 mL/min — ABNORMAL LOW (ref >60)
GFR calc non Af Amer: 38 mL/min — ABNORMAL LOW (ref >60)
Glucose, Bld: 132 mg/dL — ABNORMAL HIGH (ref 70–99)
Potassium: 4.2 mmol/L (ref 3.5–5.1)
Sodium: 141 mmol/L (ref 135–145)

## 2019-03-05 LAB — PROTIME-INR
INR: 2.3 — ABNORMAL HIGH (ref 0.8–1.2)
Prothrombin Time: 25.3 seconds — ABNORMAL HIGH (ref 11.4–15.2)

## 2019-03-05 LAB — GLUCOSE, CAPILLARY
Glucose-Capillary: 136 mg/dL — ABNORMAL HIGH (ref 70–99)
Glucose-Capillary: 198 mg/dL — ABNORMAL HIGH (ref 70–99)

## 2019-03-05 MED ORDER — FUROSEMIDE 40 MG PO TABS
40.0000 mg | ORAL_TABLET | Freq: Every day | ORAL | Status: DC
  Administered 2019-03-05: 12:00:00 40 mg via ORAL
  Filled 2019-03-05: qty 1

## 2019-03-05 MED ORDER — CARVEDILOL 6.25 MG PO TABS: 6.2500 mg | ORAL_TABLET | Freq: Two times a day (BID) | ORAL | 0 refills | Status: DC

## 2019-03-05 MED ORDER — WARFARIN SODIUM 5 MG PO TABS
5.0000 mg | ORAL_TABLET | Freq: Once | ORAL | Status: DC
  Filled 2019-03-05: qty 1

## 2019-03-05 NOTE — Consult Note (Signed)
ANTICOAGULATION CONSULT NOTE   Pharmacy Consult for Warfarin Dosing Indication: atrial fibrillation  No Known Allergies  Patient Measurements: Height: 5\' 2"  (157.5 cm) Weight: 208 lb 5.4 oz (94.5 kg) IBW/kg (Calculated) : 50.1  Vital Signs: Temp: 98.5 F (36.9 C) (08/29 0846) Temp Source: Oral (08/29 0846) BP: 149/62 (08/29 0846) Pulse Rate: 72 (08/29 0846)  Labs: Recent Labs    03/03/19 0611 03/04/19 0337 03/05/19 0414  HGB 10.0* 9.7*  --   HCT 34.0* 33.2*  --   PLT 257 234  --   LABPROT 17.0* 20.4* 25.3*  INR 1.4* 1.8* 2.3*  CREATININE 1.24* 1.60* 1.28*    Estimated Creatinine Clearance: 33.2 mL/min (A) (by C-G formula based on SCr of 1.28 mg/dL (H)).   Medical History: Past Medical History:  Diagnosis Date  . CHF (congestive heart failure) ([place])   . Chronic kidney disease   . COPD (chronic obstructive pulmonary disease) ([place])   . Diabetes mellitus without complication ([place])   . Hypertension     Medications:  Medications Prior to Admission  Medication Sig Dispense Refill Last Dose  . aspirin 81 MG tablet Take 81 mg by mouth daily.   02/28/2019 at Unknown time  . atorvastatin (LIPITOR) 10 MG tablet TAKE 1 TABLET BY MOUTH ONCE DAILY FOR CHOLESTEROL 90 tablet 3 02/28/2019 at Unknown time  . carvedilol (COREG) 25 MG tablet Take 1 tablet by mouth twice daily 180 tablet 0 02/28/2019 at Unknown time  . diltiazem (CARDIZEM CD) 240 MG 24 hr capsule Take 240 mg by mouth daily.   02/28/2019 at Unknown time  . furosemide (LASIX) 40 MG tablet TAKE 1 TABLET BY MOUTH ONCE DAILY FOR  FLUID  MAY  TAKE  1/2  TABLET  EXTRA  IF  NEEDED 120 tablet 0 02/28/2019 at Unknown time  . hydrALAZINE (APRESOLINE) 25 MG tablet TAKE 1 & 1/2 (ONE & ONE-HALF) TABLETS BY MOUTH THREE TIMES DAILY FOR BLOOD PRESSURE 405 tablet 0 02/28/2019 at Unknown time  . insulin aspart protamine- aspart (NOVOLOG MIX 70/30) (70-30) 100 UNIT/ML injection Inject 21-34 Units into the skin 2 (two) times daily with a meal.  34 units in morning and 21 units in the evening   02/28/2019 at Unknown time  . lisinopril-hydrochlorothiazide (ZESTORETIC) 20-12.5 MG tablet Take 1 tablet by mouth daily.   02/28/2019 at Unknown time  . pantoprazole (PROTONIX) 40 MG tablet Take 40 mg by mouth daily.   02/28/2019 at Unknown time  . warfarin (COUMADIN) 5 MG tablet TAKE ONE TABLET BY MOUTH ONCE DAILY AT 5-6PM AND TAKE ONE AND ONE-HALF TABLET BY MOUTH ON THURSDAY 90 tablet 4 Past Week at Unknown time   Scheduled:  . aspirin EC  81 mg Oral Daily  . atorvastatin  10 mg Oral q1800  . carvedilol  6.25 mg Oral BID WC  . Chlorhexidine Gluconate Cloth  6 each Topical Daily  . diltiazem  240 mg Oral Daily  . furosemide  40 mg Oral Daily  . hydrALAZINE  25 mg Oral Q8H  . insulin aspart  0-5 Units Subcutaneous QHS  . insulin aspart  0-9 Units Subcutaneous TID WC  . insulin aspart protamine- aspart  14 Units Subcutaneous Q supper  . insulin aspart protamine- aspart  23 Units Subcutaneous Q breakfast  . ipratropium-albuterol  3 mL Nebulization TID  . pantoprazole  40 mg Oral Daily  . Warfarin - Pharmacist Dosing Inpatient   Does not apply q1800   Infusions:   PRN: acetaminophen **OR** acetaminophen,  albuterol, bisacodyl, hydrALAZINE, HYDROcodone-acetaminophen, ipratropium-albuterol, ondansetron **OR** ondansetron (ZOFRAN) IV, senna-docusate Anti-infectives (From admission, onward)   None      Assessment: Pharmacy has been consulted for Warfarin therapy in 85yo patient admitted with acute and chronic respiratory failure with hypoxia. Patient takes Warfarin 5mg  everyday but Thursday, when she takes 7.5mg . Patient stated that she took her last dose 8/24. Appears patient is typically within therapeutic range with INR.  Date INR Dose 8/25 5.3 Vit K 5 mg 8/26 2 Refused 8/27 1.4       Warfarin 7.5mg  8/28     1.8 5 mg 8/29 2.3  Goal of Therapy:  INR 2-3 Monitor platelets by anticoagulation protocol: Yes   Plan:  Will give home dose  of 5mg  again tonight and recheck INR with am labs.    Will check CBC a minimum of every 3 days per protocol.  Noralee Space, PharmD, BCPS Clinical Pharmacist 03/05/2019 12:59 PM

## 2019-03-05 NOTE — Plan of Care (Signed)

## 2019-03-05 NOTE — Discharge Summary (Signed)
[facility] [place] NAME: Kimberly Peterson    MR#:  [id number]  DATE OF BIRTH:  Dec 08, 1929  DATE OF ADMISSION:  03/01/2019   ADMITTING PHYSICIAN: Demetrios Loll, MD  DATE OF DISCHARGE: 03/05/2019  PRIMARY CARE PHYSICIAN: [name], MD   ADMISSION DIAGNOSIS:  Shortness of breath [R06.02] Acute on chronic congestive heart failure, unspecified heart failure type ([place]) [I50.9] DISCHARGE DIAGNOSIS:  Active Problems:   Acute and chronic respiratory failure with hypoxia ([place])  SECONDARY DIAGNOSIS:   Past Medical History:  Diagnosis Date  . CHF (congestive heart failure) ([place])   . Chronic kidney disease   . COPD (chronic obstructive pulmonary disease) ([place])   . Diabetes mellitus without complication ([place])   . Hypertension    HOSPITAL COURSE:  Chief complaint; shortness of breath  History of presenting complaint; Kimberly Peterson  is a 85 y.o. female with a known history of chronic respiratory failure on home oxygen 2 L, CHF, COPD, CKD, hypertension diabetes.  The patient presented to ED with above chief complaints.  She has had worsening shortness of breath and a cough for the past 3 to 4 days.  She has leg edema.  She is found hypoxia in the ED and put on BiPAP.  Chest x-ray show CHF congestion.  She is treated with Lasix.  ED physician request admission.  Hospital course; Acute on chronic respiratory failure with hypoxia due to acute on chronic diastolic CHF.  Patient was initially placed on BiPAP and admitted to stepdown unit.  Placed on nebulizer treatment.  Already weaned off BiPAP and transferred out of stepdown unit to the medical floor.  Significantly improved clinically.  Patient back to 2 L of oxygen via nasal cannula which she uses at home.  Clinically and hemodynamically stable and wishes to be discharged home today.  Acute on chronic diastolic CHF. Patient adequately diuresed with IV Lasix.  Transitioned back to p.o. Lasix.  Patient  noted to be on lisinopril hydrochlorothiazide combination which was placed on hold due to being on multiple blood pressure medications.  Primary care physician to reevaluate before considering resuming.  2D echocardiogram done with normal ejection fraction of 60 to 65%.  Patient currently euvolemic.   COPD.  Stable.  Hypertension.  Continue current blood pressure meds.  Follow-up with primary care physician for monitoring. Continue home hypertension medication. Diabetes. Resume home insulin regimen.  Return by primary care physician Acute renal failure on CKD stage II.  Lasix was held yesterday.  Renal function improved this morning. Debility due to multiple medical problems.;  Case manager to set up home health services on discharge  I called and updated patient's daughter this morning on treatment and discharge plans.  All questions were answered and she is in agreement to the plan of care. DISCHARGE CONDITIONS:  Patient clinically and hemodynamically stable for discharge. CONSULTS OBTAINED:   DRUG ALLERGIES:  No Known Allergies DISCHARGE MEDICATIONS:   Allergies as of 03/05/2019   No Known Allergies     Medication List    STOP taking these medications   lisinopril-hydrochlorothiazide 20-12.5 MG tablet Commonly known as: ZESTORETIC     TAKE these medications   aspirin 81 MG tablet Take 81 mg by mouth daily.   atorvastatin 10 MG tablet Commonly known as: LIPITOR TAKE 1 TABLET BY MOUTH ONCE DAILY FOR CHOLESTEROL   carvedilol 6.25 MG tablet Commonly known as: COREG Take 1 tablet (6.25 mg total) by mouth 2 (two) times  daily with a meal. What changed:   medication strength  how much to take  when to take this   diltiazem 240 MG 24 hr capsule Commonly known as: CARDIZEM CD Take 240 mg by mouth daily.   furosemide 40 MG tablet Commonly known as: LASIX TAKE 1 TABLET BY MOUTH ONCE DAILY FOR  FLUID  MAY  TAKE  1/2  TABLET  EXTRA  IF  NEEDED   hydrALAZINE 25 MG tablet  Commonly known as: APRESOLINE TAKE 1 & 1/2 (ONE & ONE-HALF) TABLETS BY MOUTH THREE TIMES DAILY FOR BLOOD PRESSURE   insulin aspart protamine- aspart (70-30) 100 UNIT/ML injection Commonly known as: NOVOLOG MIX 70/30 Inject 21-34 Units into the skin 2 (two) times daily with a meal. 34 units in morning and 21 units in the evening   pantoprazole 40 MG tablet Commonly known as: PROTONIX Take 40 mg by mouth daily.   warfarin 5 MG tablet Commonly known as: COUMADIN TAKE ONE TABLET BY MOUTH ONCE DAILY AT 5-6PM AND TAKE ONE AND ONE-HALF TABLET BY MOUTH ON THURSDAY        DISCHARGE INSTRUCTIONS:   DIET:  Cardiac diet and Diabetic diet DISCHARGE CONDITION:  Stable ACTIVITY:  Activity as tolerated OXYGEN:  Home Oxygen: Yes.    Oxygen Delivery: Oxygen via nasal cannula at 2 L DISCHARGE LOCATION:  home   If you experience worsening of your admission symptoms, develop shortness of breath, life threatening emergency, suicidal or homicidal thoughts you must seek medical attention immediately by calling 911 or calling your MD immediately  if symptoms less severe.  You Must read complete instructions/literature along with all the possible adverse reactions/side effects for all the Medicines you take and that have been prescribed to you. Take any new Medicines after you have completely understood and accpet all the possible adverse reactions/side effects.   Please note  You were cared for by a hospitalist during your hospital stay. If you have any questions about your discharge medications or the care you received while you were in the hospital after you are discharged, you can call the unit and asked to speak with the hospitalist on call if the hospitalist that took care of you is not available. Once you are discharged, your primary care physician will handle any further medical issues. Please note that NO REFILLS for any discharge medications will be authorized once you are discharged, as it  is imperative that you return to your primary care physician (or establish a relationship with a primary care physician if you do not have one) for your aftercare needs so that they can reassess your need for medications and monitor your lab values.    On the day of Discharge:  VITAL SIGNS:  Blood pressure (!) 149/62, pulse 72, temperature 98.5 F (36.9 C), temperature source Oral, resp. rate 18, height 5\' 2"  (1.575 m), weight 94.5 kg, SpO2 95 %. PHYSICAL EXAMINATION:  GENERAL:  85 y.o.-year-old patient lying in the bed with no acute distress.  EYES: Pupils equal, round, reactive to light and accommodation. No scleral icterus. Extraocular muscles intact.  HEENT: Head atraumatic, normocephalic. Oropharynx and nasopharynx clear.  NECK:  Supple, no jugular venous distention. No thyroid enlargement, no tenderness.  LUNGS: Normal breath sounds bilaterally, no wheezing, rales,rhonchi or crepitation. No use of accessory muscles of respiration.  CARDIOVASCULAR: S1, S2 normal. No murmurs, rubs, or gallops.  ABDOMEN: Soft, non-tender, non-distended. Bowel sounds present. No organomegaly or mass.  EXTREMITIES: No pedal edema, cyanosis, or clubbing.  NEUROLOGIC: Cranial nerves II through XII are intact. Muscle strength 5/5 in all extremities. Sensation intact. Gait not checked.  PSYCHIATRIC: The patient is alert and oriented x 3.  SKIN: No obvious rash, lesion, or ulcer.  DATA REVIEW:   CBC Recent Labs  Lab 03/04/19 0337  WBC 9.9  HGB 9.7*  HCT 33.2*  PLT 234    Chemistries  Recent Labs  Lab 03/05/19 0414  NA 141  K 4.2  CL 97*  CO2 35*  GLUCOSE 132*  BUN 50*  CREATININE 1.28*  CALCIUM 8.7*     Microbiology Results  Results for orders placed or performed during the hospital encounter of 03/01/19  SARS CORONAVIRUS 2 (TAT 6-12 HRS)     Status: None   Collection Time: 03/01/19 11:52 AM  Result Value Ref Range Status   SARS Coronavirus 2 NEGATIVE NEGATIVE Final    Comment: (NOTE)  SARS-CoV-2 target nucleic acids are NOT DETECTED. The SARS-CoV-2 RNA is generally detectable in upper and lower respiratory specimens during the acute phase of infection. Negative results do not preclude SARS-CoV-2 infection, do not rule out co-infections with other pathogens, and should not be used as the sole basis for treatment or other patient management decisions. Negative results must be combined with clinical observations, patient history, and epidemiological information. The expected result is Negative. Fact Sheet for Patients: SugarRoll.be Fact Sheet for Healthcare Providers: https://www.woods-mathews.com/ This test is not yet approved or cleared by the [place] FDA and  has been authorized for detection and/or diagnosis of SARS-CoV-2 by FDA under an Emergency Use Authorization (EUA). This EUA will remain  in effect (meaning this test can be used) for the duration of the COVID-19 declaration under Section 56 4(b)(1) of the Act, 21 U.S.C. section 360bbb-3(b)(1), unless the authorization is terminated or revoked sooner. Performed at [facility], [place] [address] [place], [place] [address]   MRSA PCR Screening     Status: None   Collection Time: 03/01/19  3:28 PM   Specimen: Nasopharyngeal  Result Value Ref Range Status   MRSA by PCR NEGATIVE NEGATIVE Final    Comment:        The GeneXpert MRSA Assay (FDA approved for NASAL specimens only), is one component of a comprehensive MRSA colonization surveillance program. It is not intended to diagnose MRSA infection nor to guide or monitor treatment for MRSA infections. Performed at [facility], [address] [place], [place] [address]     RADIOLOGY:  No results found.   Management plans discussed with the patient, family and they are in agreement.  CODE STATUS: Full Code   TOTAL TIME TAKING CARE OF THIS PATIENT: 36 minutes.    Lysandra Loughmiller M.D on  03/05/2019 at 11:10 AM  Between 7am to 6pm - Pager - [phone]  After 6pm go to [email or web address] - password [email or web address] [facility]  [facility] [place] [facility]  Office  [phone]  CC: Primary care physician; [name], MD   Note: This dictation was prepared with Dragon dictation along with smaller phrase technology. Any transcriptional errors that result from this process are unintentional.

## 2019-03-05 NOTE — TOC Transition Note (Signed)
Transition of Care [facility]) - CM/SW Discharge Note   Patient Details  Name: Kimberly Peterson MRN: [id number] Date of Birth: 1930-07-07  Transition of Care [facility]) CM/SW Contact:  Latanya Maudlin, RN Phone Number: 03/05/2019, 11:25 AM   Clinical Narrative:  Patient to be discharged per MD order. Orders in place for home health services. Patient was already established with [facility] for home health. Notified [name] at [place] of discharge. Patient has all needed DME. Family to transport.      Final next level of care: Home w Home Health Services Barriers to Discharge: No Barriers Identified   Patient Goals and CMS Choice   CMS Medicare.gov Compare Post Acute Care list provided to:: Patient Choice offered to / list presented to : Patient  Discharge Placement                       Discharge Plan and Services     Post Acute Care Choice: Home Health(Outpatient palliative)                    HH Arranged: RN, PT, Nurse's Aide [place] Agency: [facility] (formerly [employer]) Date [place]: 03/05/19 Time [place] [facility]: [place] Representative spoke with at [place]: [place] ([place]) Interventions     Readmission Risk Interventions Readmission Risk Prevention Plan 03/05/2019 03/03/2019  Transportation Screening Complete Complete  PCP or Specialist Appt within 3-5 Days Complete -  [place] or [place] Complete Complete  Social Work Consult for [place] Planning/Counseling Complete -  Palliative Care Screening Not Applicable Complete  Medication Review Press photographer) Complete Complete  Some recent data might be hidden

## 2019-03-05 NOTE — Plan of Care (Addendum)
Patient IV and tele removed.  RN assessment and VS revealed stability for DC to home with [facility].  Discussed suggested FU with PCP and patient agreed to set up since DC on weekend.  DC papers given, explained and educated.  Once ready, will be transported via car by family.  Waiting for arrival.

## 2019-03-07 ENCOUNTER — Other Ambulatory Visit: Payer: Self-pay | Admitting: *Deleted

## 2019-03-07 ENCOUNTER — Telehealth: Payer: Self-pay

## 2019-03-07 ENCOUNTER — Encounter: Payer: Self-pay | Admitting: *Deleted

## 2019-03-07 NOTE — Patient Outreach (Signed)
[facility]) [place] Telephone Outreach PCP office completes Transition of Care follow up post-hospital discharge Post-hospital discharge day # 2  03/07/2019  Kimberly Peterson 1930/07/05 [id number]  Successful telephone outreach to Kimberly Peterson, 85 y/o female currently active with [place] RN CM after recent hospitalization August 3-5, 2020 for acute respiratory failure/ CHF exacerbation.  Patient was discharged home to self-care with [facility] for RN/ PT/ and CNA- bath aide.  Patient has history including, but not limited to, CHF; DM- Type II; HTN/ HLD; COPD- on home O2 at baseline, at 2 L/min; A-Fib, on ACT.  Unfortunately, patient experienced hospital re-admission 20 days after her previous hospital discharge, on August 25-29, 2020, again for acute-chronic respiratory failure with hypoxia/ CHF exacerbation.  Patient was again discharged home to self-care with ongoing home health services through [facility].  HIPAA/ identity verified with patient during phone call today, and patient provides ongoing verbal consent for [facility] CM involvement in her care; patient reports living with [name], and she provides verbal consent for me to speak with her daughter/ caregiver, and entirety of today's call was completed with patient and caregiver while phone on speaker mode.  Explained to patient/ caregiver that I was calling in coverage for patient's primary [facility] RN [place], and that Brayton Layman would be back in touch with patient/ caregiver with next outreach; patient and caregiver agreeable.  Today, patient reports "feeling so much better" after most recent hospital discharge; states "they didn't take enough fluid off my heart the first time I was in the hospital," which she reports led to her recent hospital re-admission.  Patient denies pain and new/ recent falls post-most recent hospital discharge on March 05, 2019, and she sounds to be in no distress throughout phone call today.  Patient/ caregiver further report:  -- has all medications and continues taking as prescribed; patient and caregiver are able to accurately verbalize recent dosing changes to carvedilol post-recent hospital discharge.  Caregiver reports that patient had 4- pound overnight weight gain at home this morning, and states she provided patient with her regularly scheduled "extra" dose of lasix this morning, as instructed for weight gain as part of action plan at home.  Patient and caregiver deny medication concerns today.  -- continues using home O2 "all the time" at 2 L/min; patient and caregiver deny concerns around patient's clinical condition/ breathing status, and also state that patient's "leg swelling" is "better than it was" since her recent hospital discharge.  Patient and caregiver are able to accurately verbalize signs/ symptoms CHF yellow zone, along with corresponding action plan and both report patient is in "green zone" today.  Reports weight at home yesterday (Sunday 8/30) as: "192 lbs" and weight this morning of "196 lbs"  -- daughter/ caregiver will continue providing transportation to all provider appointments- reports they were contacted this morning by CHF clinic, and caregiver reports she is going to call the clinic back this afternoon to confirm appointment, as she believes this is "a virtual or telephone" appointment- daughter confirms that she has the contact number for the CHF clinic.  Encouraged patient and caregiver/ daughter to also consider making prompt hospital follow up visit with patient's PCP, which they report they will do  -- home health services remain in place; caregiver reports that she has not yet heard from home health team post-most recent hospital discharge, and confirms that she has the  phone number for home health team.  Patient's active participation in home health services as ordered  post-hospital discharge was encouraged.  Patient/ caregiver both deny further issues, concerns, or problems today.  I provided/ confirmed that patient has the direct phone number for primary [facility] RN CM, the main [facility] CM office phone number, and the [facility] CM 24-hour nurse advice phone number should issues arise prior to next scheduled [place] outreach.  Encouraged patient/ caregiver to promptly contact care providers if needs, questions, issues, or concerns arise prior to next scheduled outreach; patient and caregiver verbalized agreement.  Plan:  Patient will take medications as prescribed and will attend all scheduled provider appointments  Patient will promptly schedule post-hospital discharge office visit with PCP  Patient will promptly notify care providers for any new concerns/ issues/ problems that arise  Patient will actively participate in home health services as ordered post-hospital discharge  Patient will continue monitoring/ recording daily weights and following existing plan of action for signs/ symptoms CHF yellow zone  I will make patient's primary [facility] RN CM aware of successful outreach to patient and caregiver today to facilitate ongoing [place] outreach  [facility] CM Care Plan Problem One     Most Recent Value  Care Plan Problem One  Risk for readmission related to heart failure management as evidenced by recent hospitalization  Role Documenting the Problem One  Care Management Coordinator  Care Plan for Problem One  Active  [facility] Long Term Goal   Member will not be readmitted to hospital within the next 31 days  [facility] Long Term Goal Start Date  03/07/19 [Goal re-established today after recent hospital readmission]  Interventions for Problem One Long Term Goal  Discussed with patient and caregiver patient's current clinical condition and understanding of recent hospital re-admission,  confirmed that patient has initiated medication changes at time of recent hospital  discharge,  reviewed hospital discharge instructions with patient and caregiver,  confirmed that patient contiues to have reliable transportation to all provider appointments through caregiver  [facility] CM Short Term Goal #1   Member and daughter will verbalize understanding of yellow zone on heart failure action plan within the next 4 weeks  [facility] CM Short Term Goal #1 Start Date  02/14/19  Interventions for Short Term Goal #1  Using teach back method, reiterated previously provided education around signs/ symptoms CHF zones and confirmed that patient and caregiver are able to verbalize appropriate corresponding action plan for yellow CHF zone,  confirmed that patient and caregiver believe that patient has been in green CHF zone since her recent hospital discharge,  encouraged patient and caregiver to promptly notify care providers for any new concerns/ issues/ problems that arise     Oneta Rack, RN, BSN, [place] Care Management  [phone]

## 2019-03-07 NOTE — Telephone Encounter (Signed)
CMN SIGNED AND PLACED IN [facility]. °

## 2019-03-08 ENCOUNTER — Encounter: Payer: Self-pay | Admitting: Internal Medicine

## 2019-03-08 ENCOUNTER — Telehealth: Payer: Self-pay

## 2019-03-08 ENCOUNTER — Telehealth: Payer: Self-pay | Admitting: Family

## 2019-03-08 DIAGNOSIS — N189 Chronic kidney disease, unspecified: Secondary | ICD-10-CM | POA: Diagnosis not present

## 2019-03-08 DIAGNOSIS — I5033 Acute on chronic diastolic (congestive) heart failure: Secondary | ICD-10-CM | POA: Diagnosis not present

## 2019-03-08 DIAGNOSIS — I130 Hypertensive heart and chronic kidney disease with heart failure and stage 1 through stage 4 chronic kidney disease, or unspecified chronic kidney disease: Secondary | ICD-10-CM | POA: Diagnosis not present

## 2019-03-08 DIAGNOSIS — J449 Chronic obstructive pulmonary disease, unspecified: Secondary | ICD-10-CM | POA: Diagnosis not present

## 2019-03-08 DIAGNOSIS — J9621 Acute and chronic respiratory failure with hypoxia: Secondary | ICD-10-CM | POA: Diagnosis not present

## 2019-03-08 DIAGNOSIS — E1122 Type 2 diabetes mellitus with diabetic chronic kidney disease: Secondary | ICD-10-CM | POA: Diagnosis not present

## 2019-03-08 LAB — PROTIME-INR: INR: 6 — AB (ref 0.9–1.1)

## 2019-03-08 MED ORDER — FUROSEMIDE 10 MG/ML IJ SOLN: 40.0000 mg | Freq: Once | INTRAMUSCULAR | 0 refills | Status: DC

## 2019-03-08 MED ORDER — POTASSIUM CHLORIDE CRYS ER 20 MEQ PO TBCR: 20.0000 meq | EXTENDED_RELEASE_TABLET | Freq: Once | ORAL | 0 refills | Status: DC

## 2019-03-08 MED FILL — FUROSEMIDE 10 MG/ML SOLN: 10 | 1 days supply | Qty: 4 | Fill #0

## 2019-03-08 MED FILL — POTASSIUM CHLORIDE CRYS ER: 20 | 1 days supply | Qty: 1 | Fill #0

## 2019-03-08 NOTE — Telephone Encounter (Signed)
TELEPHONE CALL NOTE  Kimberly Peterson has been deemed a candidate for a follow-up tele-health visit to limit community exposure during the Covid-19 pandemic. I spoke with the patient via phone to ensure availability of phone/video source, confirm preferred email & phone number, discuss instructions and expectations, and review consent.   I reminded Kimberly Peterson to be prepared with any vital sign and/or heart rhythm information that could potentially be obtained via home monitoring, at the time of her visit.  Finally, I reminded Kimberly Peterson to expect an e-mail containing a link for their video-based visit approximately 15 minutes before her visit, or alternatively, a phone call at the time of her visit if her visit is planned to be a phone encounter.  Did the patient verbally consent to treatment as below? YES  Kimberly Peterson L, CMA 03/08/2019 10:04 AM  CONSENT FOR TELE-HEALTH VISIT - PLEASE REVIEW  I hereby voluntarily request, consent and authorize [facility] and its employed or contracted physicians, physician assistants, nurse practitioners or other licensed health care professionals (the Practitioner), to provide me with telemedicine health care services (the "Services") as deemed necessary by the treating Practitioner. I acknowledge and consent to receive the Services by the Practitioner via telemedicine. I understand that the telemedicine visit will involve communicating with the Practitioner through telephonic communication technology and the disclosure of certain medical information by electronic transmission. I acknowledge that I have been given the opportunity to request an in-person assessment or other available alternative prior to the telemedicine visit and am voluntarily participating in the telemedicine visit.  I understand that I have the right to withhold or withdraw my consent to the use of telemedicine in the course of my care at any time, without affecting my  right to future care or treatment, and that the Practitioner or I may terminate the telemedicine visit at any time. I understand that I have the right to inspect all information obtained and/or recorded in the course of the telemedicine visit and may receive copies of available information for a reasonable fee.  I understand that some of the potential risks of receiving the Services via telemedicine include:  Marland Kitchen Delay or interruption in medical evaluation due to technological equipment failure or disruption; . Information transmitted may not be sufficient (e.g. poor resolution of images) to allow for appropriate medical decision making by the Practitioner; and/or  . In rare instances, security protocols could fail, causing a breach of personal health information.  Furthermore, I acknowledge that it is my responsibility to provide information about my medical history, conditions and care that is complete and accurate to the best of my ability. I acknowledge that Practitioner's advice, recommendations, and/or decision may be based on factors not within their control, such as incomplete or inaccurate data provided by me or lack of visual representation. I understand that the practice of medicine is not an exact science and that Practitioner makes no warranties or guarantees regarding treatment outcomes. I acknowledge that I will receive a copy of this consent concurrently upon execution via email to the email address I last provided but may also request a printed copy by calling the office of [facility].    I understand that my insurance may be billed for this visit.   I have read or had this consent read to me. . I understand the contents of this consent, which adequately explains the benefits and risks of the Services being provided via telemedicine.  Marland Kitchen  I have been provided ample opportunity to ask questions regarding this consent and the Services and have had my questions answered to my  satisfaction. . I give my informed consent for the services to be provided through the use of telemedicine in my medical care  By participating in this telemedicine visit I agree to the above.

## 2019-03-08 NOTE — Telephone Encounter (Signed)
PT WAS DISCHARGED FROM HOSP AND WAS REFERRED TO RECEIVE PALLATIVE CARE AND GAVE VERBAL OK PER [place].

## 2019-03-08 NOTE — Telephone Encounter (Signed)
[facility] nurse called back asking that RX be sent to [facility] outpatient pharmacy. RX for 40mg  IV lasix sent along with 88meq po potassium for a one time use.

## 2019-03-08 NOTE — Telephone Encounter (Signed)
Received phone call from [facility] regarding patient's weight. Nurse says that patient has gained 10 pounds in the last 2 days. Says that her shortness of breath is stable and isn't any worse. Patient has chronic swelling in her lower legs but does have some weeping now.   Patient is currently taking furosemide 40mg  daily. Nurse is able to give IV lasix so will give patient 40mg  IV lasix once and she will also drawn a BMP and fax results to [place].   Will contact patient tomorrow.

## 2019-03-09 ENCOUNTER — Ambulatory Visit: Payer: Medicare Other | Attending: Family | Admitting: Family

## 2019-03-09 ENCOUNTER — Telehealth: Payer: Self-pay

## 2019-03-09 ENCOUNTER — Telehealth: Payer: Self-pay | Admitting: Adult Health Nurse Practitioner

## 2019-03-09 ENCOUNTER — Encounter: Payer: Self-pay | Admitting: Family

## 2019-03-09 ENCOUNTER — Other Ambulatory Visit: Payer: Self-pay

## 2019-03-09 VITALS — BP 155/67 | HR 71 | Wt 202.5 lb

## 2019-03-09 DIAGNOSIS — I10 Essential (primary) hypertension: Secondary | ICD-10-CM

## 2019-03-09 DIAGNOSIS — I890 Lymphedema, not elsewhere classified: Secondary | ICD-10-CM

## 2019-03-09 DIAGNOSIS — I5033 Acute on chronic diastolic (congestive) heart failure: Secondary | ICD-10-CM

## 2019-03-09 MED ORDER — POTASSIUM CHLORIDE CRYS ER 20 MEQ PO TBCR: 20.0000 meq | EXTENDED_RELEASE_TABLET | Freq: Every day | ORAL | 5 refills | Status: DC

## 2019-03-09 MED ORDER — TORSEMIDE 20 MG PO TABS: 40.0000 mg | ORAL_TABLET | Freq: Every day | ORAL | 5 refills | Status: DC

## 2019-03-09 NOTE — Patient Instructions (Signed)
Continue weighing daily and call for an overnight weight gain of > 2 pounds or a weekly weight gain of >5 pounds.  Stop taking the furosemide and begin torsemide 40mg  daily. This will be 2 tablets every day.  You will also begin potassium 38meq once daily.

## 2019-03-09 NOTE — Progress Notes (Signed)
Virtual Visit via Telephone Note    Evaluation Performed:  Follow-up visit  This visit type was conducted due to national recommendations for restrictions regarding the COVID-19 Pandemic (e.g. social distancing).  This format is felt to be most appropriate for this patient at this time.  All issues noted in this document were discussed and addressed.  No physical exam was performed (except for noted visual exam findings with Video Visits).  Please refer to the patient's chart (MyChart message for video visits and phone note for telephone visits) for the patient's consent to telehealth for [facility]  Date:  03/09/2019   ID:  Kimberly Peterson, DOB 1930/02/28, MRN [id number]  Patient Location:  [place] [address]   Provider location:   [facility] [address] [place], [place] [address]  PCP:  Kimberly Guise, MD  Cardiologist: Kimberly Royals, MD Electrophysiologist:  None   Chief Complaint:  fatigue  History of Present Illness:    Kimberly Peterson is a 85 y.o. female who presents via audio/video conferencing for a telehealth visit today.  Patient verified DOB and address.  The patient does not have symptoms concerning for COVID-19 infection (fever, chills, cough, or new SHORTNESS OF BREATH).   Patient reports moderate fatigue with minimal exertion. She describes this as chronic in nature having been present for several years. She has associated weight gain and pedal edema along with this. She denies any dizziness, palpitations, chest pain, shortness of breath, cough, difficulty sleeping or change in appetite.   She says that she's wearing her oxygen at 2L around the clock along with her CPAP at night. She received 40mg  IV lasix and 21meq potassium yesterday and her weight went up 1.7 pounds overnight. She and her daughter say that she's been taking furosemide for "quite awhile".   Prior CV studies:   The following studies were  reviewed today:  Echo report from 02/08/2019 reviewed and showed an EF of 60-65% along with trivial AR.   Past Medical History:  Diagnosis Date  . CHF (congestive heart failure) ([place])   . Chronic kidney disease   . COPD (chronic obstructive pulmonary disease) ([place])   . Diabetes mellitus without complication ([place])   . Hypertension    Past Surgical History:  Procedure Laterality Date  . aoric valve replacemet      st [facility]  . CATARACT EXTRACTION, BILATERAL    . PACEMAKER PLACEMENT    . VALVE REPLACEMENT      Prior to Admission medications   Medication Sig Start Date End Date Taking? Authorizing Provider  aspirin 81 MG tablet Take 81 mg by mouth daily.   Yes [provider]  atorvastatin (LIPITOR) 10 MG tablet TAKE 1 TABLET BY MOUTH ONCE DAILY FOR CHOLESTEROL 12/06/18  Yes Scarboro, Kimberly Clear, NP  carvedilol (COREG) 6.25 MG tablet Take 1 tablet (6.25 mg total) by mouth 2 (two) times daily with a meal. 03/05/19 04/04/19 Yes Peterson, Jude, MD  diltiazem (CARDIZEM CD) 240 MG 24 hr capsule Take 240 mg by mouth daily.   Yes [provider]  furosemide (LASIX) 40 MG tablet TAKE 1 TABLET BY MOUTH ONCE DAILY FOR  FLUID  MAY  TAKE  1/2  TABLET  EXTRA  IF  NEEDED 02/22/19  Yes Scarboro, Kimberly Clear, NP  hydrALAZINE (APRESOLINE) 25 MG tablet TAKE 1 & 1/2 (ONE & ONE-HALF) TABLETS BY MOUTH THREE TIMES DAILY FOR BLOOD PRESSURE 02/22/19  Yes Scarboro,  Kimberly Clear, NP  insulin aspart protamine- aspart (NOVOLOG MIX 70/30) (70-30) 100 UNIT/ML injection Inject 21-34 Units into the skin 2 (two) times daily with a meal. 34 units in morning and 21 units in the evening   Yes [provider]  pantoprazole (PROTONIX) 40 MG tablet Take 40 mg by mouth daily. 06/09/18  Yes [provider]  warfarin (COUMADIN) 5 MG tablet TAKE ONE TABLET BY MOUTH ONCE DAILY AT 5-6PM AND TAKE ONE AND ONE-HALF TABLET BY MOUTH ON THURSDAY 02/22/19  Yes Kimberly Freshwater, NP      Allergies:   Patient has no known allergies.    Social History   Tobacco Use  . Smoking status: Never Smoker  . Smokeless tobacco: Never Used  Substance Use Topics  . Alcohol use: No  . Drug use: No     Family Hx: The patient's family history is not on file.  ROS:   Please see the history of present illness.     All other systems reviewed and are negative.   Labs/Other Tests and Data Reviewed:    Recent Labs: 03/03/2019: B Natriuretic Peptide 686.0 03/04/2019: Hemoglobin 9.7; Platelets 234 03/05/2019: BUN 50; Creatinine, Ser 1.28; Potassium 4.2; Sodium 141   Recent Lipid Panel Lab Results  Component Value Date/Time   CHOL 169 09/28/2017 09:28 AM   CHOL 160 08/18/2013 12:41 AM   TRIG 128 09/28/2017 09:28 AM   TRIG 80 08/18/2013 12:41 AM   HDL 56 09/28/2017 09:28 AM   HDL 68 (H) 08/18/2013 12:41 AM   LDLCALC 87 09/28/2017 09:28 AM   LDLCALC 76 08/18/2013 12:41 AM    Wt Readings from Last 3 Encounters:  03/05/19 208 lb 5.4 oz (94.5 kg)  02/17/19 204 lb (92.5 kg)  02/15/19 203 lb 6.4 oz (92.3 kg)     Exam:    Vital Signs:  Weight 202.8 pounds, BP 155/67, pulse ox on 2L was 98%  Well nourished, well developed female in no  acute distress.   ASSESSMENT & PLAN:    1. Acute on Chronic heart failure with preserved ejection fraction- - NYHA class III - fluid overloaded per daughter's weight reports and description of edema - was given 40mg  IV lasix/ 12meq PO potassium yesterday and her weight went up 1.7 pounds overnight - awaiting on BMP results that home health nurse drew yesterday - [facility] - reminded to call for an overnight weight gain of >2 pounds or a weekly weight gain of >5 pounds - drinking about 15 ounces of water and 1/2 -1 cup of coffee daily; explained that she needed to drink at least 30 ounces of water daily - will stop her furosemide and change to torsemide 40mg  daily along with 25meq potassium - not adding salt to her food - palliative care to start - had  telemedicine visit with cardiology Kimberly Peterson) 10/28/2018 - saw pulmonology Kimberly Peterson) 02/17/2019 - BNP 03/03/2019 was 686.0  2: HTN- - BP mildly elevated today reported by daughter as 155/67 - saw PCP Kimberly Peterson) 02/15/2019 & returns next week - BMP from 03/05/2019 reviewed and showed sodium 141, potassium 4.2, creatinine 1.28 and GFR 44  3: Lymphedema- - stage 2 - wearing TED hose daily with removal at bedtime - limited in her ability to exercise due to symptoms - elevating legs in the bed - consider lymphapress compression boots if edema persists   COVID-19 Education: The signs and symptoms of COVID-19 were discussed with the patient and how to  seek care for testing (follow up with PCP or arrange E-visit).  The importance of social distancing was discussed today.  Patient Risk:   After full review of this patients clinical status, I feel that they are at least moderate risk at this time.  Time:   Today, I have spent 12 minutes with the patient with telehealth technology discussing medications, weight and symptoms to report.    Medication Adjustments/Labs and Tests Ordered: Current medicines are reviewed at length with the patient today.  Concerns regarding medicines are outlined above.   Tests Ordered: No orders of the defined types were placed in this encounter.  Medication Changes: D/C furosemide RX sent to [facility] for torsemide 40mg  daily along with potassium 35meq daily  Disposition:  In 1 month or sooner for any questions/problems before then.   Signed, Alisa Graff, FNP  03/09/2019 8:38 AM    [facility]

## 2019-03-09 NOTE — Telephone Encounter (Signed)
[facility] [phone] nursing 2 times a day for 1 3 times a week one week ,twice a week for 2 weeks once a week for 4 weeks and 2 prn and Ocupational evaluation

## 2019-03-09 NOTE — Telephone Encounter (Signed)
Called daughter Noe Gens to schedule Palliative Consult, no answer.  Left message with reason for call along with my contact information

## 2019-03-10 ENCOUNTER — Other Ambulatory Visit: Payer: Self-pay

## 2019-03-10 MED ORDER — HYDRALAZINE HCL 25 MG PO TABS: ORAL_TABLET | ORAL | 0 refills | Status: DC

## 2019-03-11 ENCOUNTER — Telehealth: Payer: Self-pay | Admitting: Family

## 2019-03-11 DIAGNOSIS — J449 Chronic obstructive pulmonary disease, unspecified: Secondary | ICD-10-CM | POA: Diagnosis not present

## 2019-03-11 DIAGNOSIS — N189 Chronic kidney disease, unspecified: Secondary | ICD-10-CM | POA: Diagnosis not present

## 2019-03-11 DIAGNOSIS — J9621 Acute and chronic respiratory failure with hypoxia: Secondary | ICD-10-CM | POA: Diagnosis not present

## 2019-03-11 DIAGNOSIS — E1122 Type 2 diabetes mellitus with diabetic chronic kidney disease: Secondary | ICD-10-CM | POA: Diagnosis not present

## 2019-03-11 DIAGNOSIS — I5033 Acute on chronic diastolic (congestive) heart failure: Secondary | ICD-10-CM | POA: Diagnosis not present

## 2019-03-11 DIAGNOSIS — I130 Hypertensive heart and chronic kidney disease with heart failure and stage 1 through stage 4 chronic kidney disease, or unspecified chronic kidney disease: Secondary | ICD-10-CM | POA: Diagnosis not present

## 2019-03-11 NOTE — Telephone Encounter (Signed)
Called patient to see how she was feeling since having her diuretic was changed to torsemide 40mg  daily and starting potassium 27meq daily. She says that she started the medications yesterday and her weight was down 2 pounds overnight. She says that she feels "great". She was appreciative of the phone call.

## 2019-03-13 DIAGNOSIS — J9621 Acute and chronic respiratory failure with hypoxia: Secondary | ICD-10-CM | POA: Diagnosis not present

## 2019-03-13 DIAGNOSIS — Z9181 History of falling: Secondary | ICD-10-CM | POA: Diagnosis not present

## 2019-03-13 DIAGNOSIS — Z6836 Body mass index (BMI) 36.0-36.9, adult: Secondary | ICD-10-CM | POA: Diagnosis not present

## 2019-03-13 DIAGNOSIS — L89892 Pressure ulcer of other site, stage 2: Secondary | ICD-10-CM | POA: Diagnosis not present

## 2019-03-13 DIAGNOSIS — G4730 Sleep apnea, unspecified: Secondary | ICD-10-CM | POA: Diagnosis not present

## 2019-03-13 DIAGNOSIS — Z794 Long term (current) use of insulin: Secondary | ICD-10-CM | POA: Diagnosis not present

## 2019-03-13 DIAGNOSIS — I5033 Acute on chronic diastolic (congestive) heart failure: Secondary | ICD-10-CM | POA: Diagnosis not present

## 2019-03-13 DIAGNOSIS — J449 Chronic obstructive pulmonary disease, unspecified: Secondary | ICD-10-CM | POA: Diagnosis not present

## 2019-03-13 DIAGNOSIS — E669 Obesity, unspecified: Secondary | ICD-10-CM | POA: Diagnosis not present

## 2019-03-13 DIAGNOSIS — E1122 Type 2 diabetes mellitus with diabetic chronic kidney disease: Secondary | ICD-10-CM | POA: Diagnosis not present

## 2019-03-13 DIAGNOSIS — Z7901 Long term (current) use of anticoagulants: Secondary | ICD-10-CM | POA: Diagnosis not present

## 2019-03-13 DIAGNOSIS — Z950 Presence of cardiac pacemaker: Secondary | ICD-10-CM | POA: Diagnosis not present

## 2019-03-13 DIAGNOSIS — Z9981 Dependence on supplemental oxygen: Secondary | ICD-10-CM | POA: Diagnosis not present

## 2019-03-13 DIAGNOSIS — Z952 Presence of prosthetic heart valve: Secondary | ICD-10-CM | POA: Diagnosis not present

## 2019-03-13 DIAGNOSIS — Z7982 Long term (current) use of aspirin: Secondary | ICD-10-CM | POA: Diagnosis not present

## 2019-03-13 DIAGNOSIS — I130 Hypertensive heart and chronic kidney disease with heart failure and stage 1 through stage 4 chronic kidney disease, or unspecified chronic kidney disease: Secondary | ICD-10-CM | POA: Diagnosis not present

## 2019-03-13 DIAGNOSIS — N182 Chronic kidney disease, stage 2 (mild): Secondary | ICD-10-CM | POA: Diagnosis not present

## 2019-03-13 DIAGNOSIS — I480 Paroxysmal atrial fibrillation: Secondary | ICD-10-CM | POA: Diagnosis not present

## 2019-03-13 DIAGNOSIS — I2699 Other pulmonary embolism without acute cor pulmonale: Secondary | ICD-10-CM | POA: Diagnosis not present

## 2019-03-15 ENCOUNTER — Ambulatory Visit: Payer: Medicare Other | Admitting: Internal Medicine

## 2019-03-15 ENCOUNTER — Encounter: Payer: Self-pay | Admitting: Internal Medicine

## 2019-03-15 ENCOUNTER — Other Ambulatory Visit: Payer: Self-pay

## 2019-03-15 ENCOUNTER — Other Ambulatory Visit: Payer: Self-pay | Admitting: *Deleted

## 2019-03-15 DIAGNOSIS — E1122 Type 2 diabetes mellitus with diabetic chronic kidney disease: Secondary | ICD-10-CM | POA: Diagnosis not present

## 2019-03-15 DIAGNOSIS — J9621 Acute and chronic respiratory failure with hypoxia: Secondary | ICD-10-CM | POA: Diagnosis not present

## 2019-03-15 DIAGNOSIS — I5032 Chronic diastolic (congestive) heart failure: Secondary | ICD-10-CM | POA: Diagnosis not present

## 2019-03-15 DIAGNOSIS — L89892 Pressure ulcer of other site, stage 2: Secondary | ICD-10-CM | POA: Diagnosis not present

## 2019-03-15 DIAGNOSIS — I130 Hypertensive heart and chronic kidney disease with heart failure and stage 1 through stage 4 chronic kidney disease, or unspecified chronic kidney disease: Secondary | ICD-10-CM | POA: Diagnosis not present

## 2019-03-15 DIAGNOSIS — J9601 Acute respiratory failure with hypoxia: Secondary | ICD-10-CM | POA: Diagnosis not present

## 2019-03-15 DIAGNOSIS — I5033 Acute on chronic diastolic (congestive) heart failure: Secondary | ICD-10-CM | POA: Diagnosis not present

## 2019-03-15 DIAGNOSIS — I442 Atrioventricular block, complete: Secondary | ICD-10-CM | POA: Diagnosis not present

## 2019-03-15 DIAGNOSIS — I4891 Unspecified atrial fibrillation: Secondary | ICD-10-CM | POA: Diagnosis not present

## 2019-03-15 DIAGNOSIS — N182 Chronic kidney disease, stage 2 (mild): Secondary | ICD-10-CM | POA: Diagnosis not present

## 2019-03-15 NOTE — Patient Outreach (Signed)
[facility]) Care Management  03/15/2019  KAYSEN ZACARIAS [birth year] [id number]   Call placed to member to follow up on heart failure management.  She report she is doing "better" since being switched to Torsemide rather than Furosemide.  Confirms she is also taking Potassium with it.  Had appointment with PCP on Monday, another follow up scheduled for next week.  Report weight being 190.2 pounds today, denies any current shortness of breath.  Admits that she has not been wearing her CPAP at night because she has not been sleeping in the bed but in he recliner.  She is aware that she can use it while sleeping in the recliner, will restart using it today.  Inquired about contact with palliative care nurse from [facility], confirms the did receive message, will return call today.  Denies any urgent concerns, advised to contact this care manager with questions.  Will follow up within the next 2 weeks.  THN CM Care Plan Problem One     Most Recent Value  Care Plan Problem One  Risk for readmission related to heart failure management as evidenced by recent hospitalization  Role Documenting the Problem One  Care Management [place] for Problem One  Active  THN Long Term Goal   Member will not be readmitted to hospital within the next 31 days  THN Long Term Goal Start Date  03/07/19 [Goal re-established today after recent hospital readmission]  Interventions for Problem One Long Term Goal  Discussed with member and daughter palliative care consult, adviseed daughter to return call as soon as possible, provided with contact information  THN CM Short Term Goal #1   Member and daughter will verbalize understanding of yellow zone on heart failure action plan within the next 4 weeks  THN CM Short Term Goal #1 Start Date  02/14/19  Interventions for Short Term Goal #1  Reducated member and daughter of heart failure zones and when to contact cardiologist  [facility] CM Short Term Goal #2    Member will report taking new medications as prescried over the next 2 weeks  THN CM Short Term Goal #2 Start Date  03/15/19  Interventions for Short Term Goal #2  Medication changes reviewed with member and daughter     Valente David, [place], MSN [place] Manager [phone]

## 2019-03-15 NOTE — Progress Notes (Signed)
[facility] [place], [place] [address]  Internal MEDICINE  Telephone Visit  Patient Name: Kimberly Peterson  [id number]  [id number]  Date of Service: 03/15/2019  I connected with the patient at 11:45 am  by telephone and verified the patients identity using two identifiers.   I discussed the limitations, risks, security and privacy concerns of performing an evaluation and management service by telephone and the availability of in person appointments. I also discussed with the patient that there may be a patient responsible charge related to the service.  The patient expressed understanding and agrees to proceed.    Chief Complaint  Patient presents with  . Telephone Assessment    [phone]  . Telephone Screen  . Hospitalization Follow-up  . Medical Management of Chronic Issues    glucose 286 and inr 2.5    HPI  Pt is connected by virtual visit, she has been in the hospital twice due to acute pulmonary edema CHF, she has not been using her CPAP machine, she feels scared using it, sleeping in her recliner and not in her bed, daughter is concerned about her that she has almost developed almost a phobia of Covid and scared to go from one room to the other.Her legs and feet are swollen, Pt has complicated medical history of AVR, pacemaker, Diastolic CHF, Acute CHF, DM   Current Medication: Outpatient Encounter Medications as of 03/15/2019  Medication Sig  . aspirin 81 MG tablet Take 81 mg by mouth daily.  Marland Kitchen atorvastatin (LIPITOR) 10 MG tablet TAKE 1 TABLET BY MOUTH ONCE DAILY FOR CHOLESTEROL  . carvedilol (COREG) 6.25 MG tablet Take 1 tablet (6.25 mg total) by mouth 2 (two) times daily with a meal.  . diltiazem (CARDIZEM CD) 240 MG 24 hr capsule Take 240 mg by mouth daily.  . hydrALAZINE (APRESOLINE) 25 MG tablet TAKE 1 & 1/2 (ONE & ONE-HALF) TABLETS BY MOUTH THREE TIMES DAILY FOR BLOOD PRESSURE  . insulin aspart protamine- aspart (NOVOLOG MIX 70/30) (70-30)  100 UNIT/ML injection Inject 21-34 Units into the skin 2 (two) times daily with a meal. 34 units in morning and 21 units in the evening  . pantoprazole (PROTONIX) 40 MG tablet Take 40 mg by mouth daily.  . potassium chloride SA (K-DUR) 20 MEQ tablet Take 1 tablet (20 mEq total) by mouth daily.  Marland Kitchen torsemide ([name]) 20 MG tablet Take 2 tablets (40 mg total) by mouth daily.  Marland Kitchen warfarin (COUMADIN) 5 MG tablet TAKE ONE TABLET BY MOUTH ONCE DAILY AT 5-6PM AND TAKE ONE AND ONE-HALF TABLET BY MOUTH ON THURSDAY   No facility-administered encounter medications on file as of 03/15/2019.     Surgical History: Past Surgical History:  Procedure Laterality Date  . aoric valve replacemet      st Jude  . CATARACT EXTRACTION, BILATERAL    . PACEMAKER PLACEMENT    . VALVE REPLACEMENT      Medical History: Past Medical History:  Diagnosis Date  . CHF (congestive heart failure) ([place])   . Chronic kidney disease   . COPD (chronic obstructive pulmonary disease) ([place])   . Diabetes mellitus without complication ([place])   . Hypertension     Family History: History reviewed. No pertinent family history.  Social History   Socioeconomic History  . Marital status: Married    Spouse name: Not on file  . Number of children: Not on file  . Years of education: Not on file  . Highest education level: Not  on file  Occupational History  . Not on file  Social Needs  . Financial resource strain: Not hard at all  . Food insecurity    Worry: Never true    Inability: Never true  . Transportation needs    Medical: No    Non-medical: No  Tobacco Use  . Smoking status: Never Smoker  . Smokeless tobacco: Never Used  Substance and Sexual Activity  . Alcohol use: No  . Drug use: No  . Sexual activity: Never  Lifestyle  . Physical activity    Days per week: Not on file    Minutes per session: Not on file  . Stress: Not on file  Relationships  . Social Herbalist on phone: Not on file    Gets  together: Not on file    Attends religious service: Not on file    Active member of club or organization: Not on file    Attends meetings of clubs or organizations: Not on file    Relationship status: Not on file  . Intimate partner violence    Fear of current or ex partner: Not on file    Emotionally abused: Not on file    Physically abused: Not on file    Forced sexual activity: Not on file  Other Topics Concern  . Not on file  Social History Narrative  . Not on file    Review of Systems  Constitutional: Negative.   HENT: Positive for dental problem. Negative for congestion and mouth sores.   Respiratory: Positive for chest tightness and shortness of breath.   Cardiovascular: Positive for leg swelling. Negative for chest pain.  Endocrine: Negative.   Musculoskeletal: Positive for joint swelling.  Skin: Negative.   Neurological: Negative.   Psychiatric/Behavioral: Positive for sleep disturbance.    Vital Signs: BP 134/68   Pulse 85   Temp 97.7 F (36.5 C)   Resp 18   Ht 5\' 2"  (1.575 m)   Wt 190 lb (86.2 kg)   SpO2 90% Comment: 2litre  BMI 34.75 kg/m    Observation/Objective: Pt is sitting in her recliner, looks comfortable, legs seems to be swollen    Assessment/Plan: 1. Acute respiratory failure with hypoxia (HCC) Resolved and stable, pt to use CPAP   2. Atrial fibrillation, unspecified type ([place]) Controlled, continue on coreg and coumadin, pt has pacemaker   3. Chronic diastolic congestive heart failure ([place]) All medications are reviewed, pt is taking as prescribed, will need metb   General Counseling: [name] verbalizes understanding of the findings of today's phone visit and agrees with plan of treatment. I have discussed any further diagnostic evaluation that may be needed or ordered today. We also reviewed her medications today. she has been encouraged to call the office with any questions or concerns that should arise related to todays visit.   Time  spent:15 Minutes    Dr [name] Internal medicine

## 2019-03-16 ENCOUNTER — Ambulatory Visit (INDEPENDENT_AMBULATORY_CARE_PROVIDER_SITE_OTHER): Payer: Medicare Other

## 2019-03-16 ENCOUNTER — Other Ambulatory Visit: Payer: Self-pay

## 2019-03-16 ENCOUNTER — Encounter: Payer: Self-pay | Admitting: Internal Medicine

## 2019-03-16 DIAGNOSIS — Z7901 Long term (current) use of anticoagulants: Secondary | ICD-10-CM | POA: Diagnosis not present

## 2019-03-16 DIAGNOSIS — I4891 Unspecified atrial fibrillation: Secondary | ICD-10-CM

## 2019-03-16 LAB — PROTIME-INR: INR: 2.5 — AB (ref 0.9–1.1)

## 2019-03-16 NOTE — Progress Notes (Signed)
Pt INR 2.5 continue same med as per dr Humphrey Rolls

## 2019-03-17 ENCOUNTER — Ambulatory Visit: Payer: Medicare Other

## 2019-03-17 DIAGNOSIS — I130 Hypertensive heart and chronic kidney disease with heart failure and stage 1 through stage 4 chronic kidney disease, or unspecified chronic kidney disease: Secondary | ICD-10-CM | POA: Diagnosis not present

## 2019-03-17 DIAGNOSIS — I5033 Acute on chronic diastolic (congestive) heart failure: Secondary | ICD-10-CM | POA: Diagnosis not present

## 2019-03-17 DIAGNOSIS — E1122 Type 2 diabetes mellitus with diabetic chronic kidney disease: Secondary | ICD-10-CM | POA: Diagnosis not present

## 2019-03-17 DIAGNOSIS — J9621 Acute and chronic respiratory failure with hypoxia: Secondary | ICD-10-CM | POA: Diagnosis not present

## 2019-03-17 DIAGNOSIS — L89892 Pressure ulcer of other site, stage 2: Secondary | ICD-10-CM | POA: Diagnosis not present

## 2019-03-17 DIAGNOSIS — N182 Chronic kidney disease, stage 2 (mild): Secondary | ICD-10-CM | POA: Diagnosis not present

## 2019-03-18 ENCOUNTER — Telehealth: Payer: Self-pay | Admitting: Adult Health Nurse Practitioner

## 2019-03-18 ENCOUNTER — Other Ambulatory Visit: Payer: Self-pay | Admitting: *Deleted

## 2019-03-18 DIAGNOSIS — I130 Hypertensive heart and chronic kidney disease with heart failure and stage 1 through stage 4 chronic kidney disease, or unspecified chronic kidney disease: Secondary | ICD-10-CM | POA: Diagnosis not present

## 2019-03-18 DIAGNOSIS — I5033 Acute on chronic diastolic (congestive) heart failure: Secondary | ICD-10-CM | POA: Diagnosis not present

## 2019-03-18 DIAGNOSIS — L89892 Pressure ulcer of other site, stage 2: Secondary | ICD-10-CM | POA: Diagnosis not present

## 2019-03-18 DIAGNOSIS — J9621 Acute and chronic respiratory failure with hypoxia: Secondary | ICD-10-CM | POA: Diagnosis not present

## 2019-03-18 DIAGNOSIS — N182 Chronic kidney disease, stage 2 (mild): Secondary | ICD-10-CM | POA: Diagnosis not present

## 2019-03-18 DIAGNOSIS — E1122 Type 2 diabetes mellitus with diabetic chronic kidney disease: Secondary | ICD-10-CM | POA: Diagnosis not present

## 2019-03-18 NOTE — Telephone Encounter (Signed)
Rec'd call back from daughter and after discussing Palliative services, she was in agreement with this.  I have scheduled a Telephone Consult for 03/24/19 @ 9 AM.

## 2019-03-18 NOTE — Telephone Encounter (Signed)
Called daughter, Noe Gens to schedule Consult, no answer.  Left message with reason for call along with my contact information.

## 2019-03-18 NOTE — Patient Outreach (Signed)
[facility]) Care Management  03/18/2019  Kimberly Peterson 1929/10/26 [id number]   Case conference held to discuss 30 day readmission.  MCD team notified of changes since discharge (medication change to Torsemide and palliative care consult).  No additional interventions requested, will follow up within the next 2 weeks as scheduled.  Valente David, [place], MSN [place] [phone]

## 2019-03-21 ENCOUNTER — Other Ambulatory Visit: Payer: Self-pay

## 2019-03-21 ENCOUNTER — Ambulatory Visit (INDEPENDENT_AMBULATORY_CARE_PROVIDER_SITE_OTHER): Payer: Medicare Other | Admitting: Internal Medicine

## 2019-03-21 ENCOUNTER — Encounter: Payer: Self-pay | Admitting: Internal Medicine

## 2019-03-21 DIAGNOSIS — I4891 Unspecified atrial fibrillation: Secondary | ICD-10-CM

## 2019-03-21 DIAGNOSIS — Z9989 Dependence on other enabling machines and devices: Secondary | ICD-10-CM | POA: Diagnosis not present

## 2019-03-21 DIAGNOSIS — E1165 Type 2 diabetes mellitus with hyperglycemia: Secondary | ICD-10-CM | POA: Diagnosis not present

## 2019-03-21 DIAGNOSIS — G4733 Obstructive sleep apnea (adult) (pediatric): Secondary | ICD-10-CM | POA: Diagnosis not present

## 2019-03-21 DIAGNOSIS — I5032 Chronic diastolic (congestive) heart failure: Secondary | ICD-10-CM | POA: Diagnosis not present

## 2019-03-21 DIAGNOSIS — Z7901 Long term (current) use of anticoagulants: Secondary | ICD-10-CM | POA: Diagnosis not present

## 2019-03-21 DIAGNOSIS — K029 Dental caries, unspecified: Secondary | ICD-10-CM | POA: Diagnosis not present

## 2019-03-21 LAB — POCT GLYCOSYLATED HEMOGLOBIN (HGB A1C): Hemoglobin A1C: 6.1 % — AB (ref 4.0–5.6)

## 2019-03-21 NOTE — Progress Notes (Signed)
[facility] [place], [place] [address]  Internal MEDICINE  Office Visit Note  Patient Name: Kimberly Peterson  [id number]  [id number]  Date of Service: 03/22/2019  Chief Complaint  Patient presents with  . Diabetes  . Hypertension  . Congestive Heart Failure  . Atrial Fibrillation  . Sleep Apnea   HPI Pt has multiple medical problems. She has not been seen in the office due to risk of pandemic of Corona virus over last 6 months. Pt stopped using her CPAP/Bipap and has developed pulmonary edema/CHF last few months, was hospitalized twice as well. She has been home bound. Last week she was seen by virtual visit and importance of CPAP was discussed with her.  Her medications were modified which are reviewed today C/O problem with her teeth and difficulty chewing her food, she is having hard time with extractions due to being on chronic anticoagulation. Pt also has a pacemaker, Her coreg was decreased during her hospital admission, pt needs ongoing monitoring of her kidney functions and electrolytes   Current Medication: Outpatient Encounter Medications as of 03/21/2019  Medication Sig  . aspirin 81 MG tablet Take 81 mg by mouth daily.  Marland Kitchen atorvastatin (LIPITOR) 10 MG tablet TAKE 1 TABLET BY MOUTH ONCE DAILY FOR CHOLESTEROL  . carvedilol (COREG) 6.25 MG tablet Take 1 tablet (6.25 mg total) by mouth 2 (two) times daily with a meal.  . diltiazem (CARDIZEM CD) 240 MG 24 hr capsule Take 240 mg by mouth daily.  . hydrALAZINE (APRESOLINE) 25 MG tablet TAKE 1 & 1/2 (ONE & ONE-HALF) TABLETS BY MOUTH THREE TIMES DAILY FOR BLOOD PRESSURE  . insulin aspart protamine- aspart (NOVOLOG MIX 70/30) (70-30) 100 UNIT/ML injection Inject 21-34 Units into the skin 2 (two) times daily with a meal. 34 units in morning and 21 units in the evening  . pantoprazole (PROTONIX) 40 MG tablet Take 40 mg by mouth daily.  . potassium chloride SA (K-DUR) 20 MEQ tablet Take 1 tablet (20 mEq total) by  mouth daily.  Marland Kitchen torsemide ([name]) 20 MG tablet Take one tab po qd  . warfarin (COUMADIN) 5 MG tablet TAKE ONE TABLET BY MOUTH ONCE DAILY AT 5-6PM AND TAKE ONE AND ONE-HALF TABLET BY MOUTH ON THURSDAY  . [DISCONTINUED] carvedilol (COREG) 6.25 MG tablet Take 1 tablet (6.25 mg total) by mouth 2 (two) times daily with a meal.  . [DISCONTINUED] torsemide ([name]) 20 MG tablet Take 2 tablets (40 mg total) by mouth daily.   No facility-administered encounter medications on file as of 03/21/2019.     Surgical History: Past Surgical History:  Procedure Laterality Date  . aoric valve replacemet      st Jude  . CATARACT EXTRACTION, BILATERAL    . PACEMAKER PLACEMENT    . VALVE REPLACEMENT      Medical History: Past Medical History:  Diagnosis Date  . CHF (congestive heart failure) ([place])   . Chronic kidney disease   . COPD (chronic obstructive pulmonary disease) ([place])   . Diabetes mellitus without complication ([place])   . Hypertension     Family History: History reviewed. No pertinent family history.  Social History   Socioeconomic History  . Marital status: Married    Spouse name: Not on file  . Number of children: Not on file  . Years of education: Not on file  . Highest education level: Not on file  Occupational History  . Not on file  Social Needs  . Financial resource strain:  Not hard at all  . Food insecurity    Worry: Never true    Inability: Never true  . Transportation needs    Medical: No    Non-medical: No  Tobacco Use  . Smoking status: Never Smoker  . Smokeless tobacco: Never Used  Substance and Sexual Activity  . Alcohol use: No  . Drug use: No  . Sexual activity: Never  Lifestyle  . Physical activity    Days per week: Not on file    Minutes per session: Not on file  . Stress: Not on file  Relationships  . Social Herbalist on phone: Not on file    Gets together: Not on file    Attends religious service: Not on file    Active member of  club or organization: Not on file    Attends meetings of clubs or organizations: Not on file    Relationship status: Not on file  . Intimate partner violence    Fear of current or ex partner: Not on file    Emotionally abused: Not on file    Physically abused: Not on file    Forced sexual activity: Not on file  Other Topics Concern  . Not on file  Social History Narrative  . Not on file     Review of Systems  Constitutional: Negative for chills, fatigue and unexpected weight change.  HENT: Positive for postnasal drip. Negative for congestion, rhinorrhea, sneezing and sore throat.   Eyes: Negative for redness.  Respiratory: Negative for cough, chest tightness and shortness of breath.   Cardiovascular: Negative for chest pain and palpitations.  Gastrointestinal: Negative for abdominal pain, constipation, diarrhea, nausea and vomiting.  Genitourinary: Negative for dysuria and frequency.  Musculoskeletal: Negative for arthralgias, back pain, joint swelling and neck pain.  Skin: Negative for rash.  Neurological: Negative.  Negative for tremors and numbness.  Hematological: Negative for adenopathy. Does not bruise/bleed easily.  Psychiatric/Behavioral: Negative for behavioral problems (Depression), sleep disturbance and suicidal ideas. The patient is not nervous/anxious.    Vital Signs: BP (!) 147/69   Pulse 90   Resp 16   Ht 5\' 2"  (1.575 m)   Wt 189 lb 12.8 oz (86.1 kg)   SpO2 95% Comment: 2litre  BMI 34.71 kg/m   Physical Exam Constitutional:      General: She is not in acute distress.    Appearance: She is well-developed. She is not diaphoretic.  HENT:     Head: Normocephalic and atraumatic.     Mouth/Throat:     Pharynx: No oropharyngeal exudate.  Eyes:     Pupils: Pupils are equal, round, and reactive to light.  Neck:     Musculoskeletal: Normal range of motion and neck supple.     Thyroid: No thyromegaly.     Vascular: No JVD.     Trachea: No tracheal deviation.   Cardiovascular:     Rate and Rhythm: Rhythm irregular.     Heart sounds: Murmur present. No friction rub. No gallop.   Pulmonary:     Effort: Pulmonary effort is normal. No respiratory distress.     Breath sounds: No wheezing or rales.  Chest:     Chest wall: No tenderness.  Abdominal:     General: Bowel sounds are normal.     Palpations: Abdomen is soft.  Musculoskeletal: Normal range of motion.     Right lower leg: Edema present.     Left lower leg: Edema present.  Lymphadenopathy:  Cervical: No cervical adenopathy.  Skin:    General: Skin is warm and dry.  Neurological:     Mental Status: She is alert and oriented to person, place, and time.     Cranial Nerves: No cranial nerve deficit.  Psychiatric:        Behavior: Behavior normal.        Thought Content: Thought content normal.        Judgment: Judgment normal.    Assessment/Plan: 1. Uncontrolled type 2 diabetes mellitus with hyperglycemia (HCC)  POCT HgB A1C, 6.1, Continue Current medications as before   2. OSA on CPAP Continue on cpap, pt is encouraged to use all night  3. CHF (congestive heart failure), NYHA class I, chronic, diastolic (HCC) Stable at the moment, decreased [name] and hold potassium. Will check metb, needs walker as well   4. Long-term (current) use of anticoagulants, INR goal 2.0-3.0 Continue Coumadin INR within range   5. Atrial fibrillation, unspecified type ([place]) Stable, pacemaker follow up with cardiology   6. Dental caries noted on examination Patient needs extractions with denture, she is having hard time chewing her food, due to her DM, it is medical necessity to get her extractions and dentures asap   General Counseling: Winna verbalizes understanding of the findings of todays visit and agrees with plan of treatment. I have discussed any further diagnostic evaluation that may be needed or ordered today. We also reviewed her medications today. she has been encouraged to call the  office with any questions or concerns that should arise related to todays visit.    Orders Placed This Encounter  Procedures  . POCT HgB A1C    Meds ordered this encounter  Medications  . carvedilol (COREG) 6.25 MG tablet    Sig: Take 1 tablet (6.25 mg total) by mouth 2 (two) times daily with a meal.    Dispense:  180 tablet    Refill:  3  . torsemide ([name]) 20 MG tablet    Sig: Take one tab po qd    Dispense:  30 tablet    Refill:  5    D/C furosemide tablets    Time spent: 25 Minutes      Dr [name] Internal medicine

## 2019-03-22 ENCOUNTER — Telehealth: Payer: Self-pay | Admitting: Internal Medicine

## 2019-03-22 ENCOUNTER — Other Ambulatory Visit: Payer: Self-pay

## 2019-03-22 ENCOUNTER — Ambulatory Visit (INDEPENDENT_AMBULATORY_CARE_PROVIDER_SITE_OTHER): Payer: Medicare Other

## 2019-03-22 ENCOUNTER — Other Ambulatory Visit: Payer: Self-pay | Admitting: Internal Medicine

## 2019-03-22 DIAGNOSIS — Z7901 Long term (current) use of anticoagulants: Secondary | ICD-10-CM | POA: Diagnosis not present

## 2019-03-22 DIAGNOSIS — N182 Chronic kidney disease, stage 2 (mild): Secondary | ICD-10-CM | POA: Diagnosis not present

## 2019-03-22 DIAGNOSIS — E1122 Type 2 diabetes mellitus with diabetic chronic kidney disease: Secondary | ICD-10-CM | POA: Diagnosis not present

## 2019-03-22 DIAGNOSIS — I5033 Acute on chronic diastolic (congestive) heart failure: Secondary | ICD-10-CM | POA: Diagnosis not present

## 2019-03-22 DIAGNOSIS — I130 Hypertensive heart and chronic kidney disease with heart failure and stage 1 through stage 4 chronic kidney disease, or unspecified chronic kidney disease: Secondary | ICD-10-CM | POA: Diagnosis not present

## 2019-03-22 DIAGNOSIS — L89892 Pressure ulcer of other site, stage 2: Secondary | ICD-10-CM | POA: Diagnosis not present

## 2019-03-22 DIAGNOSIS — J9621 Acute and chronic respiratory failure with hypoxia: Secondary | ICD-10-CM | POA: Diagnosis not present

## 2019-03-22 MED ORDER — PANTOPRAZOLE SODIUM 40 MG PO TBEC: DELAYED_RELEASE_TABLET | ORAL | 1 refills | Status: DC

## 2019-03-22 MED ORDER — CARVEDILOL 6.25 MG PO TABS: 6.2500 mg | ORAL_TABLET | Freq: Two times a day (BID) | ORAL | 3 refills | Status: DC

## 2019-03-22 MED ORDER — TORSEMIDE 20 MG PO TABS: ORAL_TABLET | ORAL | 5 refills | Status: DC

## 2019-03-22 NOTE — Progress Notes (Signed)
Home InR 3.0 Ok per [employer]

## 2019-03-22 NOTE — Telephone Encounter (Signed)
Called [name] [phone]  from [facility] , asking to do lab draw for ms Kimberly Peterson, faxed over office notes and order for lab draw, per joanne instruction , faxed to [phone] [name] will be able to draw on Friday when she goes out to the home

## 2019-03-22 NOTE — Telephone Encounter (Signed)
Faxed over orders [employer] for rolling walker with seat .

## 2019-03-24 ENCOUNTER — Other Ambulatory Visit: Payer: Medicare Other | Admitting: Adult Health Nurse Practitioner

## 2019-03-24 ENCOUNTER — Other Ambulatory Visit: Payer: Medicare Other | Admitting: Nurse Practitioner

## 2019-03-24 ENCOUNTER — Other Ambulatory Visit: Payer: Self-pay

## 2019-03-24 DIAGNOSIS — Z515 Encounter for palliative care: Secondary | ICD-10-CM

## 2019-03-24 NOTE — Progress Notes (Signed)
[place] Consult Note Telephone: [phone]  Fax: [phone]  PATIENT NAME: Kimberly Peterson DOB: Nov 01, 1929 MRN: [id number]  PRIMARY CARE PROVIDER:   [name], MD  REFERRING PROVIDER:  Lavera Peterson, [address] [place],  [place] [address]  RESPONSIBLE PARTY:   Self [id number]; Daughter Kimberly Peterson [id number]  Due to the COVID-19 crisis, this visit was done via telemedicine from my office and it was initiated and consent by this patient and or family.  I was asked by Dr Kimberly Peterson to see Kimberly Peterson for palliative care consult for goals of care  RECOMMENDATIONS and PLAN:  1. ACP: full code; will mail blank copy of MOST form and hard choice book to be followed up at next scheduled PC visit. She verbalized she would not want a feeding tube  2. Generalized weakness  secondary to continue with therapy as able. Encourage energy conservation and rest times.  3. Dyspneic secondary to remain stable at present time. Continue daily weights; continue O2, will ask cardiology/heart failure clinic about option of community paramedic program due to 3 hospitalizations with CHF in 6 months  4. Palliative care encounter Palliative medicine team will continue to support patient, patient's family, and medical team. Visit consisted of counseling and education dealing with the complex and emotionally intense issues of symptom management and palliative care in the setting of serious and potentially life-threatening illness  I spent 60 minutes providing this consultation,  from 9:00am to 10:00am. More than 50% of the time in this consultation was spent coordinating communication.   HISTORY OF PRESENT ILLNESS:  Kimberly Peterson is a 85 y.o. year old female with multiple medical problems including COPD, dependent on supplemental oxygen, chronic respiratory failure with hypoxia, congestive heart failure, atrial fibrillation, pulmonary embolism, complete  heart block s / p pacemaker placement, chronic kidney disease, hypertension, diabetes, severe aortic valve stenosis, aortic valve replacement, cataract extraction bilaterally, lymphedema. Hospitalize 1 / 27 / 2020 to 1 / 30 / 2020 for acute respiratory failure with hypoxia, acute on chronic congestive heart failure. She develop blisters which verse with clear cereal segment fluid discharge on her lower extremities. Poor appetite at this time she did receive IV Lasix. Hospitalized 8 / 30 / 2020 to 8 / 5 / 2020 for acute on chronic congestive heart failure, acute respiratory failure with hypoxia. Last Echo 1 / 28 / 2020 with ef 55 to 65%. Requiring diuresing. She did continue to remain on continuous oxygen as previously baseline from home. She is on coumadin for atrial fibrillation. She was hospitalized 8 / 25 / 2020 for shortness of breath, acute on chronic congestive heart failure requiring BiPAP. Acute on chronic congestive heart failure requiring IV diuresing. She had her 2D Echo repeated with ef 60 to 65%. She was just charged back home. She does continue to be a full code at present time. She did have a palliative care consult during last hospitalization. Kimberly Peterson lives at home with her daughter and has five living children. One local with him she lives with him for in [place], One deceased. She is a widow. Her husband passed away six years ago. She is one of my siblings with only three surviving. 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 [facility] has a Shinto is ambulating with a walker, on home oxygen for about 10 years. She was able to get around independently appetite was variable. Document that she  does have an advance directive with [name] says her health care power of attorney. During palliative care visit extensive discussion about code status and she wished to remain a full code. It is documented she would not want artificial feedings, peg tube,  dialysis or tracheostomy. Hospice Services were explained. She was discharged home with home palliative care initial visit scheduled. She did have a follow-up visit 9 / 14 / 2020 with Kimberly Peterson. She stophas stopped using herusing your CPAP BiPAP and developed pulmonary edema over the last few months hospitalized twice, homebound. Complained of problem with her teeth and difficulty swallowing, having a hard time with extractions being on anticoagulant. Coreg was decrease during hospitalization. Hemoglobin A1c 6.1. Continued on CPAP and courage to use at night. Congestive heart failure currently stable nyha class 1 chronic diastolic stable decrease Demodex and held potassium. Needs to use a walker. Atrial fibrillation stable and she is does follow up with Cardiology. Dental caries needs extractions with Dentures but having a hard time due to diabetes to get extractions and dentures as soon as possible. I called Kimberly Peterson for schedule palliative care follow-up visit. We talked about purpose for palliative care visit in Kimberly Peterson in agreement. We talked about how she is feeling. She replied that she is doing very well. We talked about past medical history in the setting of chronic disease progression. We talked about congestive heart failure. We talked about shortness of breath and oxygen dependency. Kimberly Peterson endorses that her endurance is improving. She does he use her CPAP at night. We talked about following up at the Congestive heart failure Clinic with Kimberly Peterson her cardiologist. We talked about recent multiple hospitalizations 3 and 6 months. We talked about the option of possibly the [employer] Paramedic program through Congestive Heart Failure Clinic. Information provided. We talked about her recent hospitalization. We talked about her functional level as she is ambulatory with a walker. She does use a cane and laying on her daughter when she's out in the community. No recent Falls. We talked about the work  she has been doing with physical therapy. She verbalize that therapist has helped her considerably, teaching her how to breath when she walks. It is made a considerable difference. We talked about home health nursing. We talked about medical goals of care has her wishes are to remain a full code. We talked about aggressive versus conservative versus comfort care. We talked about her discussion with palliative care during hospitalization with wishes for no feeding tube. We talked about most form and her choice book. Kimberly Bender in agreement for having a blank most form and hard choice but to be mailed for further discussion. We talked about role palliative care and plan of care. Kimberly [place] in agreement to continue palliative care services and follow-up appointment schedule. Therapeutic listening and emotional support provided. Contact information provided. Questions answered to satisfaction.  Palliative Care was asked to help address goals of care.   CODE STATUS: Full code  PPS: 50% HOSPICE ELIGIBILITY/DIAGNOSIS: TBD  PAST MEDICAL HISTORY:  Past Medical History:  Diagnosis Date  . CHF (congestive heart failure) ([place])   . Chronic kidney disease   . COPD (chronic obstructive pulmonary disease) ([place])   . Diabetes mellitus without complication ([place])   . Hypertension     SOCIAL HX:  Social History   Tobacco Use  . Smoking status: Never Smoker  . Smokeless tobacco: Never Used  Substance Use Topics  . Alcohol use: No  ALLERGIES: No Known Allergies   PERTINENT MEDICATIONS:  Outpatient Encounter Medications as of 03/24/2019  Medication Sig  . aspirin 81 MG tablet Take 81 mg by mouth daily.  Marland Kitchen atorvastatin (LIPITOR) 10 MG tablet TAKE 1 TABLET BY MOUTH ONCE DAILY FOR CHOLESTEROL  . carvedilol (COREG) 6.25 MG tablet Take 1 tablet (6.25 mg total) by mouth 2 (two) times daily with a meal.  . diltiazem (CARDIZEM CD) 240 MG 24 hr capsule Take 240 mg by mouth daily.  . hydrALAZINE (APRESOLINE) 25  MG tablet TAKE 1 & 1/2 (ONE & ONE-HALF) TABLETS BY MOUTH THREE TIMES DAILY FOR BLOOD PRESSURE  . insulin aspart protamine- aspart (NOVOLOG MIX 70/30) (70-30) 100 UNIT/ML injection Inject 21-34 Units into the skin 2 (two) times daily with a meal. 34 units in morning and 21 units in the evening  . pantoprazole (PROTONIX) 40 MG tablet Take one tab po qd for stomach  . potassium chloride SA (K-DUR) 20 MEQ tablet Take 1 tablet (20 mEq total) by mouth daily.  Marland Kitchen torsemide ([name]) 20 MG tablet Take one tab po qd  . warfarin (COUMADIN) 5 MG tablet TAKE ONE TABLET BY MOUTH ONCE DAILY AT 5-6PM AND TAKE ONE AND ONE-HALF TABLET BY MOUTH ON THURSDAY   No facility-administered encounter medications on file as of 03/24/2019.     PHYSICAL EXAM:  Deferred  Christin Z Gusler, NP

## 2019-03-25 ENCOUNTER — Telehealth: Payer: Self-pay | Admitting: Internal Medicine

## 2019-03-25 NOTE — Telephone Encounter (Signed)
Kimberly Peterson FROM [facility] CALLED AND STATES THAT SHE WAS A LITTLE LATE GOING TO Kimberly Peterson AND  WAS ADVISED BY THE CARE GIVER THAT SHE WOULD PREFER THAT SHE IS THERE WHEN SHE GOES INTO THE HOME , Kimberly Peterson WILL GO IN ON Monday AND DO THE BLOOD DRAW THEN.

## 2019-03-28 ENCOUNTER — Telehealth: Payer: Self-pay

## 2019-03-28 ENCOUNTER — Ambulatory Visit (INDEPENDENT_AMBULATORY_CARE_PROVIDER_SITE_OTHER): Payer: Medicare Other

## 2019-03-28 DIAGNOSIS — Z7901 Long term (current) use of anticoagulants: Secondary | ICD-10-CM | POA: Diagnosis not present

## 2019-03-28 DIAGNOSIS — L89892 Pressure ulcer of other site, stage 2: Secondary | ICD-10-CM | POA: Diagnosis not present

## 2019-03-28 DIAGNOSIS — N182 Chronic kidney disease, stage 2 (mild): Secondary | ICD-10-CM | POA: Diagnosis not present

## 2019-03-28 DIAGNOSIS — J9621 Acute and chronic respiratory failure with hypoxia: Secondary | ICD-10-CM | POA: Diagnosis not present

## 2019-03-28 DIAGNOSIS — I4821 Permanent atrial fibrillation: Secondary | ICD-10-CM | POA: Diagnosis not present

## 2019-03-28 DIAGNOSIS — E1122 Type 2 diabetes mellitus with diabetic chronic kidney disease: Secondary | ICD-10-CM | POA: Diagnosis not present

## 2019-03-28 DIAGNOSIS — I130 Hypertensive heart and chronic kidney disease with heart failure and stage 1 through stage 4 chronic kidney disease, or unspecified chronic kidney disease: Secondary | ICD-10-CM | POA: Diagnosis not present

## 2019-03-28 DIAGNOSIS — I5033 Acute on chronic diastolic (congestive) heart failure: Secondary | ICD-10-CM | POA: Diagnosis not present

## 2019-03-28 NOTE — Telephone Encounter (Signed)
[facility] TO [facility] CALL us BACK FOR WALKER WE FAXED

## 2019-03-29 ENCOUNTER — Ambulatory Visit: Payer: Self-pay | Admitting: *Deleted

## 2019-03-29 NOTE — Progress Notes (Addendum)
Pt  INR 3.1 continue same med as per [employer]

## 2019-03-30 ENCOUNTER — Encounter: Payer: Self-pay | Admitting: Nurse Practitioner

## 2019-03-30 ENCOUNTER — Other Ambulatory Visit: Payer: Self-pay | Admitting: *Deleted

## 2019-03-30 ENCOUNTER — Telehealth: Payer: Self-pay

## 2019-03-30 DIAGNOSIS — I5033 Acute on chronic diastolic (congestive) heart failure: Secondary | ICD-10-CM | POA: Diagnosis not present

## 2019-03-30 DIAGNOSIS — J9621 Acute and chronic respiratory failure with hypoxia: Secondary | ICD-10-CM | POA: Diagnosis not present

## 2019-03-30 DIAGNOSIS — E1122 Type 2 diabetes mellitus with diabetic chronic kidney disease: Secondary | ICD-10-CM | POA: Diagnosis not present

## 2019-03-30 DIAGNOSIS — L89892 Pressure ulcer of other site, stage 2: Secondary | ICD-10-CM | POA: Diagnosis not present

## 2019-03-30 DIAGNOSIS — N182 Chronic kidney disease, stage 2 (mild): Secondary | ICD-10-CM | POA: Diagnosis not present

## 2019-03-30 DIAGNOSIS — I130 Hypertensive heart and chronic kidney disease with heart failure and stage 1 through stage 4 chronic kidney disease, or unspecified chronic kidney disease: Secondary | ICD-10-CM | POA: Diagnosis not present

## 2019-03-30 LAB — PROTIME-INR
INR: 3 — AB (ref 0.9–1.1)
INR: 3.1 — AB (ref 0.9–1.1)

## 2019-03-30 NOTE — Telephone Encounter (Signed)
ORDERS SIGNED AND PLACED IN [facility] FOLDER. °

## 2019-03-30 NOTE — Patient Outreach (Signed)
[facility]) Care Management  03/30/2019  Kimberly Peterson [birth year] [id number]   Call placed to member to follow up on heart failure management.  She report she is doing much better since her medication change.  State her weights, blood pressure, and blood sugars have all been stable.  Confirms contact with palliative care NP, will continue involvement with program.  Made aware of recommendations for paramedicine program, she will consider and aware referral/call from MD office to discuss further.  Denies any shortness of breath or chest discomfort, denies any urgent concerns at this time.  Will follow up within the next month.  [facility] CM Care Plan Problem One     Most Recent Value  Care Plan Problem One  Risk for readmission related to heart failure management as evidenced by recent hospitalization  Role Documenting the Problem One  Care Management [place] for Problem One  Active  [facility] Long Term Goal   Member will not be readmitted to hospital within the next 31 days  [facility] Long Term Goal Start Date  03/07/19 [Goal re-established today after recent hospital readmission]  Interventions for Problem One Long Term Goal  Discussed recommendations for paramedicine involvement.  Program explained, advised to consider  [facility] CM Short Term Goal #1   Member and daughter will verbalize understanding of yellow zone on heart failure action plan within the next 4 weeks  [facility] CM Short Term Goal #1 Start Date  02/14/19  [facility] CM Short Term Goal #1 Met Date  03/30/19  [facility] CM Short Term Goal #2   Member will report taking new medications as prescried over the next 2 weeks  [facility] CM Short Term Goal #2 Start Date  03/15/19  [facility] CM Short Term Goal #2 Met Date  03/30/19     Valente David, RN, MSN Kimberly Peterson [phone]

## 2019-03-31 DIAGNOSIS — L89892 Pressure ulcer of other site, stage 2: Secondary | ICD-10-CM | POA: Diagnosis not present

## 2019-03-31 DIAGNOSIS — J9621 Acute and chronic respiratory failure with hypoxia: Secondary | ICD-10-CM | POA: Diagnosis not present

## 2019-03-31 DIAGNOSIS — N182 Chronic kidney disease, stage 2 (mild): Secondary | ICD-10-CM | POA: Diagnosis not present

## 2019-03-31 DIAGNOSIS — E1122 Type 2 diabetes mellitus with diabetic chronic kidney disease: Secondary | ICD-10-CM | POA: Diagnosis not present

## 2019-03-31 DIAGNOSIS — I130 Hypertensive heart and chronic kidney disease with heart failure and stage 1 through stage 4 chronic kidney disease, or unspecified chronic kidney disease: Secondary | ICD-10-CM | POA: Diagnosis not present

## 2019-03-31 DIAGNOSIS — I5033 Acute on chronic diastolic (congestive) heart failure: Secondary | ICD-10-CM | POA: Diagnosis not present

## 2019-04-04 ENCOUNTER — Ambulatory Visit (INDEPENDENT_AMBULATORY_CARE_PROVIDER_SITE_OTHER): Payer: Medicare Other

## 2019-04-04 ENCOUNTER — Other Ambulatory Visit: Payer: Self-pay

## 2019-04-04 ENCOUNTER — Telehealth: Payer: Self-pay

## 2019-04-04 DIAGNOSIS — Z7901 Long term (current) use of anticoagulants: Secondary | ICD-10-CM

## 2019-04-04 NOTE — Progress Notes (Signed)
Pt INR 2.9 continue same med as per heather  

## 2019-04-04 NOTE — Progress Notes (Deleted)
Note was started prior to knowing that this visit was going to be a telemedicine visit.     HPI    Review of Systems  Constitutional: Negative for appetite change and fatigue.  HENT: Negative for congestion, rhinorrhea and sore throat.   Eyes: Negative.   Respiratory: Positive for shortness of breath. Negative for cough.   Cardiovascular: Positive for leg swelling (1+ pitting). Negative for chest pain and palpitations.  Gastrointestinal: Negative for abdominal distention and abdominal pain.  Endocrine: Negative.   Genitourinary: Negative.   Musculoskeletal: Negative for back pain and neck pain.  Skin: Positive for wound (left lower leg open wound).  Allergic/Immunologic: Negative.   Neurological: Negative for dizziness and light-headedness.  Hematological: Negative for adenopathy. Does not bruise/bleed easily.  Psychiatric/Behavioral: Negative for dysphoric mood and sleep disturbance (wearing oxygen at 2L around the clock). The patient is not nervous/anxious.      Physical Exam Vitals signs and nursing note reviewed.  Constitutional:      Appearance: She is well-developed.  HENT:     Head: Normocephalic and atraumatic.  Neck:     Musculoskeletal: Normal range of motion and neck supple.     Vascular: No JVD.  Cardiovascular:     Rate and Rhythm: Normal rate and regular rhythm.  Pulmonary:     Effort: Pulmonary effort is normal.     Breath sounds: No wheezing or rales.  Abdominal:     Palpations: Abdomen is soft.     Tenderness: There is no abdominal tenderness.  Musculoskeletal:     Right lower leg: She exhibits no tenderness. Edema (1+ pitting) present.     Left lower leg: She exhibits no tenderness. Edema (1+ pitting) present.  Skin:    General: Skin is warm and dry.  Neurological:     General: No focal deficit present.     Mental Status: She is alert and oriented to person, place, and time.  Psychiatric:        Mood and Affect: Mood normal.        Behavior:  Behavior normal.

## 2019-04-04 NOTE — Telephone Encounter (Signed)
PLAN OF CARE SIGNED AND PLACED IN [facility] FOLDER °

## 2019-04-05 ENCOUNTER — Telehealth: Payer: Self-pay

## 2019-04-05 DIAGNOSIS — I130 Hypertensive heart and chronic kidney disease with heart failure and stage 1 through stage 4 chronic kidney disease, or unspecified chronic kidney disease: Secondary | ICD-10-CM | POA: Diagnosis not present

## 2019-04-05 DIAGNOSIS — J9621 Acute and chronic respiratory failure with hypoxia: Secondary | ICD-10-CM | POA: Diagnosis not present

## 2019-04-05 DIAGNOSIS — I5033 Acute on chronic diastolic (congestive) heart failure: Secondary | ICD-10-CM | POA: Diagnosis not present

## 2019-04-05 DIAGNOSIS — N182 Chronic kidney disease, stage 2 (mild): Secondary | ICD-10-CM | POA: Diagnosis not present

## 2019-04-05 DIAGNOSIS — L89892 Pressure ulcer of other site, stage 2: Secondary | ICD-10-CM | POA: Diagnosis not present

## 2019-04-05 DIAGNOSIS — E1122 Type 2 diabetes mellitus with diabetic chronic kidney disease: Secondary | ICD-10-CM | POA: Diagnosis not present

## 2019-04-05 NOTE — Telephone Encounter (Signed)
Spoke with patient daughtr and explained her medical card do not have a dental plan and she may need to contact self pay dental locations like [facility] so her mom can get the dental care needed. Kimberly Peterson

## 2019-04-05 NOTE — Telephone Encounter (Signed)
TELEPHONE CALL NOTE  Kimberly Peterson has been deemed a candidate for a follow-up tele-health visit to limit community exposure during the Covid-19 pandemic. I spoke with the patient via phone to ensure availability of phone/video source, confirm preferred email & phone number, discuss instructions and expectations, and review consent.   I reminded Kimberly Peterson to be prepared with any vital sign and/or heart rhythm information that could potentially be obtained via home monitoring, at the time of her visit.  Finally, I reminded Kimberly Peterson to expect an e-mail containing a link for their video-based visit approximately 15 minutes before her visit, or alternatively, a phone call at the time of her visit if her visit is planned to be a phone encounter.  Did the patient verbally consent to treatment as below? YES  Gaylord Shih, CMA 04/05/2019 2:43 PM  CONSENT FOR TELE-HEALTH VISIT - PLEASE REVIEW  I hereby voluntarily request, consent and authorize [facility] and its employed or contracted physicians, physician assistants, nurse practitioners or other licensed health care professionals (the Practitioner), to provide me with telemedicine health care services (the "Services") as deemed necessary by the treating Practitioner. I acknowledge and consent to receive the Services by the Practitioner via telemedicine. I understand that the telemedicine visit will involve communicating with the Practitioner through telephonic communication technology and the disclosure of certain medical information by electronic transmission. I acknowledge that I have been given the opportunity to request an in-person assessment or other available alternative prior to the telemedicine visit and am voluntarily participating in the telemedicine visit.  I understand that I have the right to withhold or withdraw my consent to the use of telemedicine in the course of my care at any time, without affecting my  right to future care or treatment, and that the Practitioner or I may terminate the telemedicine visit at any time. I understand that I have the right to inspect all information obtained and/or recorded in the course of the telemedicine visit and may receive copies of available information for a reasonable fee.  I understand that some of the potential risks of receiving the Services via telemedicine include:  Marland Kitchen Delay or interruption in medical evaluation due to technological equipment failure or disruption; . Information transmitted may not be sufficient (e.g. poor resolution of images) to allow for appropriate medical decision making by the Practitioner; and/or  . In rare instances, security protocols could fail, causing a breach of personal health information.  Furthermore, I acknowledge that it is my responsibility to provide information about my medical history, conditions and care that is complete and accurate to the best of my ability. I acknowledge that Practitioner's advice, recommendations, and/or decision may be based on factors not within their control, such as incomplete or inaccurate data provided by me or lack of visual representation. I understand that the practice of medicine is not an exact science and that Practitioner makes no warranties or guarantees regarding treatment outcomes. I acknowledge that I will receive a copy of this consent concurrently upon execution via email to the email address I last provided but may also request a printed copy by calling the office of [facility].    I understand that my insurance may be billed for this visit.   I have read or had this consent read to me. . I understand the contents of this consent, which adequately explains the benefits and risks of the Services being provided via telemedicine.  Marland Kitchen  I have been provided ample opportunity to ask questions regarding this consent and the Services and have had my questions answered to my  satisfaction. . I give my informed consent for the services to be provided through the use of telemedicine in my medical care  By participating in this telemedicine visit I agree to the above.

## 2019-04-06 ENCOUNTER — Ambulatory Visit: Payer: Medicare Other | Attending: Family | Admitting: Family

## 2019-04-06 ENCOUNTER — Other Ambulatory Visit: Payer: Self-pay

## 2019-04-06 ENCOUNTER — Encounter: Payer: Self-pay | Admitting: Family

## 2019-04-06 ENCOUNTER — Other Ambulatory Visit: Payer: Self-pay | Admitting: Internal Medicine

## 2019-04-06 VITALS — BP 169/79 | HR 85 | Temp 97.5°F | Wt 184.1 lb

## 2019-04-06 DIAGNOSIS — I10 Essential (primary) hypertension: Secondary | ICD-10-CM

## 2019-04-06 DIAGNOSIS — I890 Lymphedema, not elsewhere classified: Secondary | ICD-10-CM

## 2019-04-06 DIAGNOSIS — I5032 Chronic diastolic (congestive) heart failure: Secondary | ICD-10-CM

## 2019-04-06 NOTE — Progress Notes (Signed)
Virtual Visit via Telephone Note   Evaluation Performed:  Follow-up visit  This visit type was conducted due to national recommendations for restrictions regarding the COVID-19 Pandemic (e.g. social distancing).  This format is felt to be most appropriate for this patient at this time.  All issues noted in this document were discussed and addressed.  No physical exam was performed (except for noted visual exam findings with Video Visits).  Please refer to the patient's chart (MyChart message for video visits and phone note for telephone visits) for the patient's consent to telehealth for [facility]  Date:  04/06/2019   ID:  Kimberly Peterson, DOB 1929-10-15, MRN [id number]  Patient Location:  [place] [address]   Provider location:   [facility] [place] [address], [place] [address]  PCP:  [name], MD  Cardiologist:  Serafina Royals, MD Electrophysiologist:  None   Chief Complaint:  Minimal fatigue  History of Present Illness:    Kimberly Peterson is a 85 y.o. female who presents via audio/video conferencing for a telehealth visit today.  Patient verified DOB and address.  The patient does not have symptoms concerning for COVID-19 infection (fever, chills, cough, or new SHORTNESS OF BREATH).   Patient reports minimal fatigue upon moderate exertion. She says that this has been present for several years but has been improving. She has associated shortness of breath (improving) and minimal pedal edema in her right leg along with this. She denies any dizziness, abdominal distention, palpitations, chest pain, cough or weight gain.   She wears her oxygen at 2L around the clock along with CPAP at night. She has not taken any of her medications yet this morning.    Prior CV studies:   The following studies were reviewed today:  Echo report from 02/08/2019 reviewed and showed an EF of 60-65% along with trivial AR.   Past  Medical History:  Diagnosis Date  . CHF (congestive heart failure) ([place])   . Chronic kidney disease   . COPD (chronic obstructive pulmonary disease) ([place])   . Diabetes mellitus without complication ([place])   . Hypertension    Past Surgical History:  Procedure Laterality Date  . aoric valve replacemet      st Jude  . CATARACT EXTRACTION, BILATERAL    . PACEMAKER PLACEMENT    . VALVE REPLACEMENT       No outpatient medications have been marked as taking for the 04/06/19 encounter (Appointment) with Alisa Graff, FNP.     Allergies:   Patient has no known allergies.   Social History   Tobacco Use  . Smoking status: Never Smoker  . Smokeless tobacco: Never Used  Substance Use Topics  . Alcohol use: No  . Drug use: No     Family Hx: The patient's family history is not on file.  ROS:   Please see the history of present illness.     All other systems reviewed and are negative.  Prior to Admission medications   Medication Sig Start Date End Date Taking? Authorizing Provider  aspirin 81 MG tablet Take 81 mg by mouth daily.   Yes [provider]  atorvastatin (LIPITOR) 10 MG tablet TAKE 1 TABLET BY MOUTH ONCE DAILY FOR CHOLESTEROL 12/06/18  Yes Kendell Bane, NP  carvedilol (COREG) 6.25 MG tablet Take 1 tablet (6.25 mg total) by mouth 2 (two) times daily with a meal. 03/22/19 04/21/19 Yes Clayborn Bigness  M, MD  diltiazem (CARDIZEM CD) 240 MG 24 hr capsule Take 240 mg by mouth daily.   Yes [provider]  hydrALAZINE (APRESOLINE) 25 MG tablet TAKE 1 & 1/2 (ONE & ONE-HALF) TABLETS BY MOUTH THREE TIMES DAILY FOR BLOOD PRESSURE 03/10/19  Yes [place], Audie Clear, NP  insulin aspart protamine- aspart (NOVOLOG MIX 70/30) (70-30) 100 UNIT/ML injection Inject 21-34 Units into the skin 2 (two) times daily with a meal. 34 units in morning and 21 units in the evening   Yes [provider]  pantoprazole (PROTONIX) 40 MG tablet Take one tab po qd for stomach 03/22/19  Yes  [name], MD  torsemide [facility]) 20 MG tablet Take one tab po qd 03/22/19  Yes [name], MD  warfarin (COUMADIN) 5 MG tablet TAKE ONE TABLET BY MOUTH ONCE DAILY AT 5-6PM AND TAKE ONE AND ONE-HALF TABLET BY MOUTH ON THURSDAY 02/22/19  Yes Ronnell Freshwater, NP    Labs/Other Tests and Data Reviewed:    Recent Labs: 03/03/2019: B Natriuretic Peptide 686.0 03/04/2019: Hemoglobin 9.7; Platelets 234 03/05/2019: BUN 50; Creatinine, Ser 1.28; Potassium 4.2; Sodium 141   Recent Lipid Panel Lab Results  Component Value Date/Time   CHOL 169 09/28/2017 09:28 AM   CHOL 160 08/18/2013 12:41 AM   TRIG 128 09/28/2017 09:28 AM   TRIG 80 08/18/2013 12:41 AM   HDL 56 09/28/2017 09:28 AM   HDL 68 (H) 08/18/2013 12:41 AM   LDLCALC 87 09/28/2017 09:28 AM   LDLCALC 76 08/18/2013 12:41 AM    Wt Readings from Last 3 Encounters:  03/21/19 189 lb 12.8 oz (86.1 kg)  03/15/19 190 lb (86.2 kg)  03/09/19 202 lb 8 oz (91.9 kg)     Exam:    Vitals:   04/06/19 0912  BP: (!) 169/79  Pulse: 85  Temp: (!) 97.5 F (36.4 C)  SpO2: 96%  Weight: 184 lb 2 oz (83.5 kg)   Vitals were reported by her daughter.   Well nourished, well developed female in no  acute distress.   ASSESSMENT & PLAN:    1. Chronic heart failure with preserved ejection fraction- - NYHA class II - euvolemic today - weighing daily and home weight has been stable; reminded to call for an overnight weight gain of >2 pounds or a weekly weight gain of >5 pounds - drinking about 15 ounces of water and 1/2 -1 cup of coffee daily; explained that she needed to drink at least 30 ounces of water daily - not adding salt to her food - palliative care had telemedicine visit with patient 03/24/2019 - had telemedicine visit with cardiology [name]) 10/28/2018 - saw pulmonology Humphrey Rolls) 02/17/2019 - BNP 03/03/2019 was 686.0 - discussed paramedicine program with patient and she is interested so will make that referral  2: HTN- - BP elevated  today but she hasn't taken her medications yet this morning - saw PCP Stephanie Coup) 03/21/2019 - BMP from 03/05/2019 reviewed and showed sodium 141, potassium 4.2, creatinine 1.28 and GFR 44  3: Lymphedema- - stage 2 - wearing TED hose daily with removal at bedtime - limited in her ability to exercise due to symptoms - elevating legs in the bed - edema has improved per patient since diuretic was changed to torsemide - consider lymphapress compression boots if edema persists    COVID-19 Education: The signs and symptoms of COVID-19 were discussed with the patient and how to seek care for testing (follow up with PCP or arrange  E-visit).  The importance of social distancing was discussed today.  Patient Risk:   After full review of this patients clinical status, I feel that they are at least moderate risk at this time.  Time:   Today, I have spent 11 minutes with the patient with telehealth technology discussing medications, symptoms and paramedicine program.      Medication Adjustments/Labs and Tests Ordered: Current medicines are reviewed at length with the patient today.  Concerns regarding medicines are outlined above.   Tests Ordered: No orders of the defined types were placed in this encounter.  Medication Changes: No orders of the defined types were placed in this encounter.   Disposition:  Follow-up in 3 months or sooner for any questions/problems before then.   Signed, Alisa Graff, FNP  04/06/2019 9:00 AM    [facility]

## 2019-04-06 NOTE — Patient Instructions (Signed)
Continue weighing daily and call for an overnight weight gain of > 2 pounds or a weekly weight gain of >5 pounds. 

## 2019-04-08 DIAGNOSIS — J9621 Acute and chronic respiratory failure with hypoxia: Secondary | ICD-10-CM | POA: Diagnosis not present

## 2019-04-08 DIAGNOSIS — I5033 Acute on chronic diastolic (congestive) heart failure: Secondary | ICD-10-CM | POA: Diagnosis not present

## 2019-04-08 DIAGNOSIS — L89892 Pressure ulcer of other site, stage 2: Secondary | ICD-10-CM | POA: Diagnosis not present

## 2019-04-08 DIAGNOSIS — N182 Chronic kidney disease, stage 2 (mild): Secondary | ICD-10-CM | POA: Diagnosis not present

## 2019-04-08 DIAGNOSIS — E1122 Type 2 diabetes mellitus with diabetic chronic kidney disease: Secondary | ICD-10-CM | POA: Diagnosis not present

## 2019-04-08 DIAGNOSIS — I130 Hypertensive heart and chronic kidney disease with heart failure and stage 1 through stage 4 chronic kidney disease, or unspecified chronic kidney disease: Secondary | ICD-10-CM | POA: Diagnosis not present

## 2019-04-11 ENCOUNTER — Ambulatory Visit (INDEPENDENT_AMBULATORY_CARE_PROVIDER_SITE_OTHER): Payer: Medicare Other

## 2019-04-11 ENCOUNTER — Other Ambulatory Visit: Payer: Self-pay

## 2019-04-11 DIAGNOSIS — Z7901 Long term (current) use of anticoagulants: Secondary | ICD-10-CM | POA: Diagnosis not present

## 2019-04-11 NOTE — Progress Notes (Signed)
Pt inr 2.9 continue same med as per [employer]

## 2019-04-12 ENCOUNTER — Other Ambulatory Visit: Payer: Self-pay

## 2019-04-12 ENCOUNTER — Encounter: Payer: Self-pay | Admitting: Internal Medicine

## 2019-04-12 ENCOUNTER — Ambulatory Visit (INDEPENDENT_AMBULATORY_CARE_PROVIDER_SITE_OTHER): Payer: Medicare Other | Admitting: Internal Medicine

## 2019-04-12 VITALS — BP 148/67 | HR 78 | Resp 16 | Ht 62.0 in | Wt 188.4 lb

## 2019-04-12 DIAGNOSIS — E1122 Type 2 diabetes mellitus with diabetic chronic kidney disease: Secondary | ICD-10-CM | POA: Diagnosis not present

## 2019-04-12 DIAGNOSIS — I2699 Other pulmonary embolism without acute cor pulmonale: Secondary | ICD-10-CM | POA: Diagnosis not present

## 2019-04-12 DIAGNOSIS — Z950 Presence of cardiac pacemaker: Secondary | ICD-10-CM | POA: Diagnosis not present

## 2019-04-12 DIAGNOSIS — Z952 Presence of prosthetic heart valve: Secondary | ICD-10-CM | POA: Diagnosis not present

## 2019-04-12 DIAGNOSIS — Z9981 Dependence on supplemental oxygen: Secondary | ICD-10-CM

## 2019-04-12 DIAGNOSIS — I4891 Unspecified atrial fibrillation: Secondary | ICD-10-CM

## 2019-04-12 DIAGNOSIS — Z9181 History of falling: Secondary | ICD-10-CM | POA: Diagnosis not present

## 2019-04-12 DIAGNOSIS — G4733 Obstructive sleep apnea (adult) (pediatric): Secondary | ICD-10-CM

## 2019-04-12 DIAGNOSIS — Z9989 Dependence on other enabling machines and devices: Secondary | ICD-10-CM

## 2019-04-12 DIAGNOSIS — I480 Paroxysmal atrial fibrillation: Secondary | ICD-10-CM | POA: Diagnosis not present

## 2019-04-12 DIAGNOSIS — Z794 Long term (current) use of insulin: Secondary | ICD-10-CM | POA: Diagnosis not present

## 2019-04-12 DIAGNOSIS — Z6836 Body mass index (BMI) 36.0-36.9, adult: Secondary | ICD-10-CM | POA: Diagnosis not present

## 2019-04-12 DIAGNOSIS — I5032 Chronic diastolic (congestive) heart failure: Secondary | ICD-10-CM | POA: Diagnosis not present

## 2019-04-12 DIAGNOSIS — G4730 Sleep apnea, unspecified: Secondary | ICD-10-CM | POA: Diagnosis not present

## 2019-04-12 DIAGNOSIS — I130 Hypertensive heart and chronic kidney disease with heart failure and stage 1 through stage 4 chronic kidney disease, or unspecified chronic kidney disease: Secondary | ICD-10-CM | POA: Diagnosis not present

## 2019-04-12 DIAGNOSIS — J449 Chronic obstructive pulmonary disease, unspecified: Secondary | ICD-10-CM | POA: Diagnosis not present

## 2019-04-12 DIAGNOSIS — N182 Chronic kidney disease, stage 2 (mild): Secondary | ICD-10-CM | POA: Diagnosis not present

## 2019-04-12 DIAGNOSIS — J9621 Acute and chronic respiratory failure with hypoxia: Secondary | ICD-10-CM | POA: Diagnosis not present

## 2019-04-12 DIAGNOSIS — E669 Obesity, unspecified: Secondary | ICD-10-CM | POA: Diagnosis not present

## 2019-04-12 DIAGNOSIS — Z7901 Long term (current) use of anticoagulants: Secondary | ICD-10-CM | POA: Diagnosis not present

## 2019-04-12 DIAGNOSIS — I5033 Acute on chronic diastolic (congestive) heart failure: Secondary | ICD-10-CM | POA: Diagnosis not present

## 2019-04-12 NOTE — Progress Notes (Signed)
[facility] [address], [place] [address]  Internal MEDICINE  Telephone Visit  Patient Name: Kimberly Peterson  [id number]  [id number]  Date of Service: 04/12/2019  I connected with the patient at 1214 by telephone and verified the patients identity using two identifiers.   I discussed the limitations, risks, security and privacy concerns of performing an evaluation and management service by telephone and the availability of in person appointments. I also discussed with the patient that there may be a patient responsible charge related to the service.  The patient expressed understanding and agrees to proceed.    Chief Complaint  Patient presents with  . Telephone Assessment  . Telephone Screen  . Follow-up    cpap     HPI  PT is seen via video.  She is seen for cpap follow up.  She is using her machine nightly, and cleaning her machine by hand. She denies any issues with mask seal.  She does not have any snoring, headaches or other issues. She is changing her filters and tubing as well.  Overall she is doing well.     Current Medication: Outpatient Encounter Medications as of 04/12/2019  Medication Sig  . aspirin 81 MG tablet Take 81 mg by mouth daily.  Marland Kitchen atorvastatin (LIPITOR) 10 MG tablet TAKE 1 TABLET BY MOUTH ONCE DAILY FOR CHOLESTEROL  . CARTIA XT 240 MG 24 hr capsule Take 1 capsule by mouth once daily  . carvedilol (COREG) 6.25 MG tablet Take 1 tablet (6.25 mg total) by mouth 2 (two) times daily with a meal.  . hydrALAZINE (APRESOLINE) 25 MG tablet TAKE 1 & 1/2 (ONE & ONE-HALF) TABLETS BY MOUTH THREE TIMES DAILY FOR BLOOD PRESSURE  . insulin aspart protamine- aspart (NOVOLOG MIX 70/30) (70-30) 100 UNIT/ML injection Inject 21-34 Units into the skin 2 (two) times daily with a meal. 34 units in morning and 21 units in the evening  . OXYGEN Inhale into the lungs. 2 litre 24 hrs  . pantoprazole (PROTONIX) 40 MG tablet Take one tab po qd for stomach  .  torsemide ([name]) 20 MG tablet Take one tab po qd  . warfarin (COUMADIN) 5 MG tablet TAKE ONE TABLET BY MOUTH ONCE DAILY AT 5-6PM AND TAKE ONE AND ONE-HALF TABLET BY MOUTH ON THURSDAY   No facility-administered encounter medications on file as of 04/12/2019.     Surgical History: Past Surgical History:  Procedure Laterality Date  . aoric valve replacemet      st Jude  . CATARACT EXTRACTION, BILATERAL    . PACEMAKER PLACEMENT    . VALVE REPLACEMENT      Medical History: Past Medical History:  Diagnosis Date  . CHF (congestive heart failure) ([place])   . Chronic kidney disease   . COPD (chronic obstructive pulmonary disease) ([place])   . Diabetes mellitus without complication ([place])   . Hypertension     Family History: History reviewed. No pertinent family history.  Social History   Socioeconomic History  . Marital status: Married    Spouse name: Not on file  . Number of children: Not on file  . Years of education: Not on file  . Highest education level: Not on file  Occupational History  . Not on file  Social Needs  . Financial resource strain: Not hard at all  . Food insecurity    Worry: Never true    Inability: Never true  . Transportation needs    Medical: No  Non-medical: No  Tobacco Use  . Smoking status: Never Smoker  . Smokeless tobacco: Never Used  Substance and Sexual Activity  . Alcohol use: No  . Drug use: No  . Sexual activity: Never  Lifestyle  . Physical activity    Days per week: Not on file    Minutes per session: Not on file  . Stress: Not on file  Relationships  . Social Herbalist on phone: Not on file    Gets together: Not on file    Attends religious service: Not on file    Active member of club or organization: Not on file    Attends meetings of clubs or organizations: Not on file    Relationship status: Not on file  . Intimate partner violence    Fear of current or ex partner: Not on file    Emotionally abused: Not on  file    Physically abused: Not on file    Forced sexual activity: Not on file  Other Topics Concern  . Not on file  Social History Narrative  . Not on file      Review of Systems  Constitutional: Negative for chills, fatigue and unexpected weight change.  HENT: Negative for congestion, rhinorrhea, sneezing and sore throat.   Eyes: Negative for photophobia, pain and redness.  Respiratory: Negative for cough, chest tightness and shortness of breath.   Cardiovascular: Negative for chest pain and palpitations.  Gastrointestinal: Negative for abdominal pain, constipation, diarrhea, nausea and vomiting.  Endocrine: Negative.   Genitourinary: Negative for dysuria and frequency.  Musculoskeletal: Negative for arthralgias, back pain, joint swelling and neck pain.  Skin: Negative for rash.  Allergic/Immunologic: Negative.   Neurological: Negative for tremors and numbness.  Hematological: Negative for adenopathy. Does not bruise/bleed easily.  Psychiatric/Behavioral: Negative for behavioral problems and sleep disturbance. The patient is not nervous/anxious.     Vital Signs: BP (!) 148/67   Pulse 78   Resp 16   Ht 5\' 2"  (1.575 m)   Wt 188 lb 6.4 oz (85.5 kg)   SpO2 98%   BMI 34.46 kg/m    Observation/Objective:  Well appearing, NAD noted.    Assessment/Plan: 1. OSA on CPAP Continue to use cpap as directed while sleeping.   2. Dependence on supplemental oxygen Continue to use oxygen continually as directed.    3. CHF (congestive heart failure), NYHA class I, chronic, diastolic (H[facility]) Continue to follow up with cardiology as scheduled.   4. Atrial fibrillation, unspecified type ([place]) Continue to follow up with cardiology as directed.   General Counseling: Chrisanne verbalizes understanding of the findings of today's phone visit and agrees with plan of treatment. I have discussed any further diagnostic evaluation that may be needed or ordered today. We also reviewed her  medications today. she has been encouraged to call the office with any questions or concerns that should arise related to todays visit.    No orders of the defined types were placed in this encounter.   No orders of the defined types were placed in this encounter.   Time spent: 15 Minutes   This patient was seen by Orson Gear AGNP-C in Collaboration with Dr. Devona Konig as a part of collaborative care agreement.  Orson Gear AGNP-C Pulmonary medicine

## 2019-04-15 DIAGNOSIS — I130 Hypertensive heart and chronic kidney disease with heart failure and stage 1 through stage 4 chronic kidney disease, or unspecified chronic kidney disease: Secondary | ICD-10-CM | POA: Diagnosis not present

## 2019-04-15 DIAGNOSIS — J449 Chronic obstructive pulmonary disease, unspecified: Secondary | ICD-10-CM | POA: Diagnosis not present

## 2019-04-15 DIAGNOSIS — E1122 Type 2 diabetes mellitus with diabetic chronic kidney disease: Secondary | ICD-10-CM | POA: Diagnosis not present

## 2019-04-15 DIAGNOSIS — I5033 Acute on chronic diastolic (congestive) heart failure: Secondary | ICD-10-CM | POA: Diagnosis not present

## 2019-04-15 DIAGNOSIS — N182 Chronic kidney disease, stage 2 (mild): Secondary | ICD-10-CM | POA: Diagnosis not present

## 2019-04-15 DIAGNOSIS — J9621 Acute and chronic respiratory failure with hypoxia: Secondary | ICD-10-CM | POA: Diagnosis not present

## 2019-04-18 ENCOUNTER — Ambulatory Visit (INDEPENDENT_AMBULATORY_CARE_PROVIDER_SITE_OTHER): Payer: Medicare Other

## 2019-04-18 ENCOUNTER — Other Ambulatory Visit: Payer: Self-pay

## 2019-04-18 DIAGNOSIS — Z7901 Long term (current) use of anticoagulants: Secondary | ICD-10-CM

## 2019-04-18 NOTE — Progress Notes (Signed)
Pt INR 2.7 continue same med as per adam

## 2019-04-19 ENCOUNTER — Telehealth: Payer: Self-pay

## 2019-04-19 NOTE — Telephone Encounter (Signed)
Faxed surgery clearance to [facility] [phone] and put copy in scan. Beth

## 2019-04-21 ENCOUNTER — Telehealth: Payer: Self-pay | Admitting: Adult Health Nurse Practitioner

## 2019-04-21 ENCOUNTER — Other Ambulatory Visit: Payer: Medicare Other | Admitting: Adult Health Nurse Practitioner

## 2019-04-21 ENCOUNTER — Other Ambulatory Visit: Payer: Self-pay

## 2019-04-21 ENCOUNTER — Other Ambulatory Visit: Payer: Self-pay | Admitting: *Deleted

## 2019-04-21 NOTE — Telephone Encounter (Signed)
Called patient for scheduled telephone appointment at [facility].  Was some confusion as she said someone was supposed to come between 10 and 11 this morning.  Rescheduled visit for Monday 10/19 at 11am Mahoganie Basher K. Olena Heckle NP

## 2019-04-21 NOTE — Patient Outreach (Signed)
[facility]) Care Management  04/21/2019  JOVON STREETMAN [birth year] [id number]   Call placed to member to follow up on heart failure management.  Still has some shortness of breath with activity, but denies at this time.  Report weight has been stable, aware of referral to paramedicine, state she has already received call to enroll in program.  Follow up with cardiology on 9/30, next in January.  Appointment with pulmonology on 10/6, next in 4 months.  Appointment with PCP on 11/9.  Member denies any urgent concerns at this time, will follow up within the next month.  [facility] CM Care Plan Problem One     Most Recent Value  Care Plan Problem One  Risk for readmission related to heart failure management as evidenced by recent hospitalization  Role Documenting the Problem One  Care Management Lorenzo for Problem One  Active  [facility] Long Term Goal   Member will not be readmitted to hospital within the next 31 days  [facility] Long Term Goal Start Date  03/07/19 [Goal re-established today after recent hospital readmission]  THN Long Term Goal Met Date  04/21/19  [facility] CM Short Term Goal #1   Member will report compliance with paramedicine visits over the next 4 weeks  [facility] CM Short Term Goal #1 Start Date  04/21/19  Interventions for Short Term Goal #1  Educated on the process of paramedicine, advised of importance of weekly visits and daily management of heart failure  [facility] CM Short Term Goal #2   Member will report keeping and completing appointment with palliative care NP  [facility] CM Short Term Goal #2 Start Date  04/21/19  Interventions for Short Term Goal #2  Reviewed upcoming appointment with member and palliative care NP     Valente David, RN, MSN [place] Manager [phone]

## 2019-04-22 ENCOUNTER — Encounter: Payer: Self-pay | Admitting: Internal Medicine

## 2019-04-22 ENCOUNTER — Telehealth: Payer: Self-pay

## 2019-04-22 ENCOUNTER — Other Ambulatory Visit: Payer: Self-pay | Admitting: Internal Medicine

## 2019-04-22 NOTE — Telephone Encounter (Signed)
Her last HgbA1c done 03/21/2019 was 6.1. I don't see  where any diabetic medication were changed. Is there anything you would like me to do for her?

## 2019-04-23 DIAGNOSIS — J449 Chronic obstructive pulmonary disease, unspecified: Secondary | ICD-10-CM | POA: Diagnosis not present

## 2019-04-23 DIAGNOSIS — I130 Hypertensive heart and chronic kidney disease with heart failure and stage 1 through stage 4 chronic kidney disease, or unspecified chronic kidney disease: Secondary | ICD-10-CM | POA: Diagnosis not present

## 2019-04-23 DIAGNOSIS — N182 Chronic kidney disease, stage 2 (mild): Secondary | ICD-10-CM | POA: Diagnosis not present

## 2019-04-23 DIAGNOSIS — J9621 Acute and chronic respiratory failure with hypoxia: Secondary | ICD-10-CM | POA: Diagnosis not present

## 2019-04-23 DIAGNOSIS — E1122 Type 2 diabetes mellitus with diabetic chronic kidney disease: Secondary | ICD-10-CM | POA: Diagnosis not present

## 2019-04-23 DIAGNOSIS — I5033 Acute on chronic diastolic (congestive) heart failure: Secondary | ICD-10-CM | POA: Diagnosis not present

## 2019-04-25 ENCOUNTER — Ambulatory Visit (INDEPENDENT_AMBULATORY_CARE_PROVIDER_SITE_OTHER): Payer: Medicare Other

## 2019-04-25 ENCOUNTER — Other Ambulatory Visit: Payer: Medicare Other | Admitting: Adult Health Nurse Practitioner

## 2019-04-25 ENCOUNTER — Other Ambulatory Visit: Payer: Self-pay

## 2019-04-25 DIAGNOSIS — Z7901 Long term (current) use of anticoagulants: Secondary | ICD-10-CM | POA: Diagnosis not present

## 2019-04-25 DIAGNOSIS — I4821 Permanent atrial fibrillation: Secondary | ICD-10-CM | POA: Diagnosis not present

## 2019-04-25 NOTE — Progress Notes (Signed)
Home inr 3.0 continue with current treatment

## 2019-04-27 ENCOUNTER — Emergency Department: Payer: Medicare Other

## 2019-04-27 ENCOUNTER — Other Ambulatory Visit: Payer: Self-pay

## 2019-04-27 ENCOUNTER — Inpatient Hospital Stay
Admission: EM | Admit: 2019-04-27 | Discharge: 2019-05-01 | DRG: 291 | Disposition: A | Discharge status: Home Health | Payer: Medicare Other | Attending: Internal Medicine | Admitting: Internal Medicine

## 2019-04-27 DIAGNOSIS — Z20828 Contact with and (suspected) exposure to other viral communicable diseases: Secondary | ICD-10-CM | POA: Diagnosis present

## 2019-04-27 DIAGNOSIS — Z952 Presence of prosthetic heart valve: Secondary | ICD-10-CM | POA: Diagnosis not present

## 2019-04-27 DIAGNOSIS — J449 Chronic obstructive pulmonary disease, unspecified: Secondary | ICD-10-CM | POA: Diagnosis present

## 2019-04-27 DIAGNOSIS — Z7901 Long term (current) use of anticoagulants: Secondary | ICD-10-CM | POA: Diagnosis not present

## 2019-04-27 DIAGNOSIS — I361 Nonrheumatic tricuspid (valve) insufficiency: Secondary | ICD-10-CM | POA: Diagnosis not present

## 2019-04-27 DIAGNOSIS — J9621 Acute and chronic respiratory failure with hypoxia: Secondary | ICD-10-CM | POA: Diagnosis present

## 2019-04-27 DIAGNOSIS — I5033 Acute on chronic diastolic (congestive) heart failure: Secondary | ICD-10-CM | POA: Diagnosis present

## 2019-04-27 DIAGNOSIS — R0902 Hypoxemia: Secondary | ICD-10-CM | POA: Diagnosis not present

## 2019-04-27 DIAGNOSIS — Z7982 Long term (current) use of aspirin: Secondary | ICD-10-CM

## 2019-04-27 DIAGNOSIS — I214 Non-ST elevation (NSTEMI) myocardial infarction: Secondary | ICD-10-CM | POA: Diagnosis not present

## 2019-04-27 DIAGNOSIS — J810 Acute pulmonary edema: Secondary | ICD-10-CM | POA: Diagnosis present

## 2019-04-27 DIAGNOSIS — I10 Essential (primary) hypertension: Secondary | ICD-10-CM | POA: Diagnosis not present

## 2019-04-27 DIAGNOSIS — I340 Nonrheumatic mitral (valve) insufficiency: Secondary | ICD-10-CM | POA: Diagnosis not present

## 2019-04-27 DIAGNOSIS — N179 Acute kidney failure, unspecified: Secondary | ICD-10-CM | POA: Diagnosis present

## 2019-04-27 DIAGNOSIS — J9601 Acute respiratory failure with hypoxia: Secondary | ICD-10-CM

## 2019-04-27 DIAGNOSIS — Z950 Presence of cardiac pacemaker: Secondary | ICD-10-CM | POA: Diagnosis not present

## 2019-04-27 DIAGNOSIS — E1122 Type 2 diabetes mellitus with diabetic chronic kidney disease: Secondary | ICD-10-CM | POA: Diagnosis present

## 2019-04-27 DIAGNOSIS — Z79899 Other long term (current) drug therapy: Secondary | ICD-10-CM | POA: Diagnosis not present

## 2019-04-27 DIAGNOSIS — I130 Hypertensive heart and chronic kidney disease with heart failure and stage 1 through stage 4 chronic kidney disease, or unspecified chronic kidney disease: Principal | ICD-10-CM | POA: Diagnosis present

## 2019-04-27 DIAGNOSIS — I5043 Acute on chronic combined systolic (congestive) and diastolic (congestive) heart failure: Secondary | ICD-10-CM | POA: Diagnosis present

## 2019-04-27 DIAGNOSIS — R609 Edema, unspecified: Secondary | ICD-10-CM | POA: Diagnosis not present

## 2019-04-27 DIAGNOSIS — Z794 Long term (current) use of insulin: Secondary | ICD-10-CM | POA: Diagnosis not present

## 2019-04-27 DIAGNOSIS — E1165 Type 2 diabetes mellitus with hyperglycemia: Secondary | ICD-10-CM | POA: Diagnosis not present

## 2019-04-27 DIAGNOSIS — R0602 Shortness of breath: Secondary | ICD-10-CM | POA: Diagnosis not present

## 2019-04-27 LAB — COMPREHENSIVE METABOLIC PANEL
ALT: 12 U/L (ref 0–44)
AST: 24 U/L (ref 15–41)
Albumin: 3.8 g/dL (ref 3.5–5.0)
Alkaline Phosphatase: 78 U/L (ref 38–126)
Anion gap: 13 (ref 5–15)
BUN: 30 mg/dL — ABNORMAL HIGH (ref 8–23)
CO2: 27 mmol/L (ref 22–32)
Calcium: 9.3 mg/dL (ref 8.9–10.3)
Chloride: 101 mmol/L (ref 98–111)
Creatinine, Ser: 1.13 mg/dL — ABNORMAL HIGH (ref 0.44–1.00)
GFR calc Af Amer: 51 mL/min — ABNORMAL LOW (ref >60)
GFR calc non Af Amer: 44 mL/min — ABNORMAL LOW (ref >60)
Glucose, Bld: 249 mg/dL — ABNORMAL HIGH (ref 70–99)
Potassium: 3.9 mmol/L (ref 3.5–5.1)
Sodium: 141 mmol/L (ref 135–145)
Total Bilirubin: 0.8 mg/dL (ref 0.3–1.2)
Total Protein: 8.7 g/dL — ABNORMAL HIGH (ref 6.5–8.1)

## 2019-04-27 LAB — CBC WITH DIFFERENTIAL/PLATELET
Abs Immature Granulocytes: 0.09 10*3/uL — ABNORMAL HIGH (ref 0.00–0.07)
Basophils Absolute: 0.1 10*3/uL (ref 0.0–0.1)
Basophils Relative: 1 %
Eosinophils Absolute: 0 10*3/uL (ref 0.0–0.5)
Eosinophils Relative: 0 %
HCT: 32.7 % — ABNORMAL LOW (ref 36.0–46.0)
Hemoglobin: 9.6 g/dL — ABNORMAL LOW (ref 12.0–15.0)
Immature Granulocytes: 1 %
Lymphocytes Relative: 10 %
Lymphs Abs: 1.1 10*3/uL (ref 0.7–4.0)
MCH: 24.9 pg — ABNORMAL LOW (ref 26.0–34.0)
MCHC: 29.4 g/dL — ABNORMAL LOW (ref 30.0–36.0)
MCV: 84.7 fL (ref 80.0–100.0)
Monocytes Absolute: 0.6 10*3/uL (ref 0.1–1.0)
Monocytes Relative: 5 %
Neutro Abs: 9.4 10*3/uL — ABNORMAL HIGH (ref 1.7–7.7)
Neutrophils Relative %: 83 %
Platelets: 296 10*3/uL (ref 150–400)
RBC: 3.86 MIL/uL — ABNORMAL LOW (ref 3.87–5.11)
RDW: 16.3 % — ABNORMAL HIGH (ref 11.5–15.5)
WBC: 11.2 10*3/uL — ABNORMAL HIGH (ref 4.0–10.5)
nRBC: 0 % (ref 0.0–0.2)

## 2019-04-27 LAB — GLUCOSE, CAPILLARY
Glucose-Capillary: 215 mg/dL — ABNORMAL HIGH (ref 70–99)
Glucose-Capillary: 121 mg/dL — ABNORMAL HIGH (ref 70–99)

## 2019-04-27 LAB — TROPONIN I (HIGH SENSITIVITY)
Troponin I (High Sensitivity): 73 ng/L — ABNORMAL HIGH (ref <18)
Troponin I (High Sensitivity): 123 ng/L (ref <18)

## 2019-04-27 LAB — TSH: TSH: 0.5 u[IU]/mL (ref 0.350–4.500)

## 2019-04-27 LAB — SARS CORONAVIRUS 2 BY RT PCR (HOSPITAL ORDER, PERFORMED IN ~~LOC~~ HOSPITAL LAB): SARS Coronavirus 2: NEGATIVE

## 2019-04-27 LAB — BRAIN NATRIURETIC PEPTIDE: B Natriuretic Peptide: 270 pg/mL — ABNORMAL HIGH (ref 0.0–100.0)

## 2019-04-27 LAB — MRSA PCR SCREENING: MRSA by PCR: NEGATIVE

## 2019-04-27 LAB — PROTIME-INR
INR: 2.2 — ABNORMAL HIGH (ref 0.8–1.2)
Prothrombin Time: 24.2 seconds — ABNORMAL HIGH (ref 11.4–15.2)

## 2019-04-27 LAB — APTT: aPTT: 39 seconds — ABNORMAL HIGH (ref 24–36)

## 2019-04-27 MED ORDER — NITROGLYCERIN IN D5W 200-5 MCG/ML-% IV SOLN
0.0000 ug/min | INTRAVENOUS | Status: DC
  Administered 2019-04-27: 5 ug/min via INTRAVENOUS
  Filled 2019-04-27: qty 250

## 2019-04-27 MED ORDER — HEPARIN (PORCINE) 25000 UT/250ML-% IV SOLN
950.0000 [IU]/h | INTRAVENOUS | Status: DC
  Administered 2019-04-27: 800 [IU]/h via INTRAVENOUS
  Administered 2019-04-28: 950 [IU]/h via INTRAVENOUS
  Filled 2019-04-27 (×2): qty 250

## 2019-04-27 MED ORDER — ACETAMINOPHEN 650 MG RE SUPP: 650.0000 mg | Freq: Four times a day (QID) | RECTAL | Status: DC | PRN

## 2019-04-27 MED ORDER — INSULIN ASPART 100 UNIT/ML ~~LOC~~ SOLN
0.0000 [IU] | Freq: Three times a day (TID) | SUBCUTANEOUS | Status: DC
  Administered 2019-04-27: 5 [IU] via SUBCUTANEOUS
  Administered 2019-04-28: 3 [IU] via SUBCUTANEOUS
  Administered 2019-04-28 – 2019-04-29 (×3): 2 [IU] via SUBCUTANEOUS
  Administered 2019-04-29: 5 [IU] via SUBCUTANEOUS
  Administered 2019-04-30: 3 [IU] via SUBCUTANEOUS
  Administered 2019-04-30: 2 [IU] via SUBCUTANEOUS
  Administered 2019-04-30 – 2019-05-01 (×2): 3 [IU] via SUBCUTANEOUS
  Filled 2019-04-27 (×10): qty 1

## 2019-04-27 MED ORDER — HYDRALAZINE HCL 25 MG PO TABS
38.5000 mg | ORAL_TABLET | Freq: Three times a day (TID) | ORAL | Status: DC
  Filled 2019-04-27 (×3): qty 1.5

## 2019-04-27 MED ORDER — CHLORHEXIDINE GLUCONATE CLOTH 2 % EX PADS
6.0000 | MEDICATED_PAD | Freq: Every day | CUTANEOUS | Status: DC
  Administered 2019-04-27 – 2019-04-30 (×3): 6 via TOPICAL

## 2019-04-27 MED ORDER — FUROSEMIDE 10 MG/ML IJ SOLN: 20.0000 mg | INTRAMUSCULAR | Status: AC

## 2019-04-27 MED ORDER — ONDANSETRON HCL 4 MG PO TABS: 4.0000 mg | ORAL_TABLET | Freq: Four times a day (QID) | ORAL | Status: DC | PRN

## 2019-04-27 MED ORDER — DOCUSATE SODIUM 100 MG PO CAPS
100.0000 mg | ORAL_CAPSULE | Freq: Two times a day (BID) | ORAL | Status: DC
  Administered 2019-04-27 – 2019-05-01 (×8): 100 mg via ORAL
  Filled 2019-04-27 (×9): qty 1

## 2019-04-27 MED ORDER — ONDANSETRON HCL 4 MG/2ML IJ SOLN: 4.0000 mg | Freq: Four times a day (QID) | INTRAMUSCULAR | Status: DC | PRN

## 2019-04-27 MED ORDER — INSULIN GLARGINE 100 UNIT/ML ~~LOC~~ SOLN
24.0000 [IU] | Freq: Every day | SUBCUTANEOUS | Status: DC
  Administered 2019-04-27 – 2019-04-30 (×4): 24 [IU] via SUBCUTANEOUS
  Filled 2019-04-27 (×5): qty 0.24

## 2019-04-27 MED ORDER — PANTOPRAZOLE SODIUM 40 MG PO TBEC
40.0000 mg | DELAYED_RELEASE_TABLET | Freq: Every day | ORAL | Status: DC
  Administered 2019-04-28 – 2019-05-01 (×4): 40 mg via ORAL
  Filled 2019-04-27 (×4): qty 1

## 2019-04-27 MED ORDER — FUROSEMIDE 10 MG/ML IJ SOLN
40.0000 mg | Freq: Once | INTRAMUSCULAR | Status: AC
  Administered 2019-04-27: 40 mg via INTRAVENOUS
  Filled 2019-04-27: qty 4

## 2019-04-27 MED ORDER — ACETAMINOPHEN 325 MG PO TABS: 650.0000 mg | ORAL_TABLET | Freq: Four times a day (QID) | ORAL | Status: DC | PRN

## 2019-04-27 MED ORDER — ASPIRIN EC 81 MG PO TBEC
81.0000 mg | DELAYED_RELEASE_TABLET | Freq: Every day | ORAL | Status: DC
  Administered 2019-04-28 – 2019-05-01 (×4): 81 mg via ORAL
  Filled 2019-04-27 (×4): qty 1

## 2019-04-27 MED ORDER — HYDRALAZINE HCL 25 MG PO TABS
37.5000 mg | ORAL_TABLET | Freq: Three times a day (TID) | ORAL | Status: DC
  Administered 2019-04-27 – 2019-05-01 (×11): 37.5 mg via ORAL
  Filled 2019-04-27 (×13): qty 1.5

## 2019-04-27 MED ORDER — HEPARIN BOLUS VIA INFUSION
4000.0000 [IU] | Freq: Once | INTRAVENOUS | Status: AC
  Administered 2019-04-27: 4000 [IU] via INTRAVENOUS
  Filled 2019-04-27: qty 4000

## 2019-04-27 NOTE — Consult Note (Signed)
ANTICOAGULATION CONSULT NOTE   Pharmacy Consult for Heparin Dosing  Indication: chest pain/ACS  No Known Allergies  Patient Measurements: Height: 5\' 2"  (157.5 cm) Weight: 203 lb 4.8 oz (92.2 kg) IBW/kg (Calculated) : 50.1 Heparin Dosing Weight: 69.4 kg   Vital Signs: Temp: 97.7 F (36.5 C) (10/21 1326) Temp Source: Axillary (10/21 1326) BP: 154/56 (10/21 1600) Pulse Rate: 69 (10/21 1600)  Labs: Recent Labs    04/27/19 1327 04/27/19 1531  HGB 9.6*  --   HCT 32.7*  --   PLT 296  --   CREATININE 1.13*  --   TROPONINIHS 73* 123*    Estimated Creatinine Clearance: 37 mL/min (A) (by C-G formula based on SCr of 1.13 mg/dL (H)).   Medications:  Warfarin PTA medication - last take 10/20 @ 6 pm.   Assessment: Patient presented to the ED with acute respiratory distress and hypoxia. Patient has a PMH significant for CHF, CKD, COPD, HTN, DM, and HTN. Complaints of SOB began today yet patient had no complaints of chest pain on presentation. BL INR/PT and aPTT will need to be obtained.   Given last dose has been ~ 24 hours, it is okay to start heparin.   Hgb 9.6  (though since August 2020 Hgb 9.7-11.3); Per Dr. Marcille Blanco okay to give bolus.   Troponin 73 > 123   Goal of Therapy:  Heparin level 0.3-0.7 units/ml Monitor platelets by anticoagulation protocol: Yes   Plan:  Baseline labs have been ordered  Heparin DW:  69.4 kg  Give 4000 units bolus x 1 Start heparin infusion at 800 units/hr Check anti-Xa level in 8 hours and daily while on heparin, per protocol Continue to monitor H&H and platelets  Kimberly Peterson 04/27/2019,4:43 PM

## 2019-04-27 NOTE — ED Notes (Signed)
Admitting provider informed of critical trop

## 2019-04-27 NOTE — Consult Note (Signed)
CRITICAL CARE NOTE  CC  resp failure  SUBJECTIVE 85 y.o. female  past medical history presents the ER with acute respiratory distress and hypoxia - States that she started feeling short of breath today - No fevers.   Placed on biPAP Increased WOB seems to be resolving, remains on biPAP Being treated for acute Systolic CHF Was given lasix in ER  High risk for intubation    BP (!) 154/56   Pulse 69   Temp 97.9 F (36.6 C) (Axillary)   Resp 14   Ht _0  (1.575 m)   Wt 90.7 kg   SpO2 100%   BMI 36.57 kg/m    No intake/output data recorded. Total I/O In: 2.6 [I.V.:2.6] Out: -   SpO2: 100 % O2 Flow Rate (L/min): 8 L/min   SIGNIFICANT EVENTS   REVIEW OF SYSTEMS  PATIENT IS UNABLE TO PROVIDE COMPLETE REVIEW OF SYSTEMS DUE TO SEVERE CRITICAL ILLNESS   PHYSICAL EXAMINATION:  GENERAL:critically ill appearing, +resp distress HEAD: Normocephalic, atraumatic.  EYES: Pupils equal, round, reactive to light.  No scleral icterus.  MOUTH: Moist mucosal membrane. NECK: Supple.  PULMONARY: +rhonchi, +wheezing CARDIOVASCULAR: S1 and S2. Regular rate and rhythm. No murmurs, rubs, or gallops.  GASTROINTESTINAL: Soft, nontender, -distended. No masses. Positive bowel sounds. No hepatosplenomegaly.  MUSCULOSKELETAL: No swelling, clubbing, or edema.  NEUROLOGIC: arousable  SKIN:intact,warm,dry  MEDICATIONS: I have reviewed all medications and confirmed regimen as documented   CULTURE RESULTS   Recent Results (from the past 240 hour(s))  SARS Coronavirus 2 by RT PCR (hospital order, performed in [facility] hospital lab) Nasopharyngeal Nasopharyngeal Swab     Status: None   Collection Time: 04/27/19  1:27 PM   Specimen: Nasopharyngeal Swab  Result Value Ref Range Status   SARS Coronavirus 2 NEGATIVE NEGATIVE Final    Comment: (NOTE) If result is NEGATIVE SARS-CoV-2 target nucleic acids are NOT DETECTED. The SARS-CoV-2 RNA is generally detectable in upper and lower   respiratory specimens during the acute phase of infection. The lowest  concentration of SARS-CoV-2 viral copies this assay can detect is 250  copies / mL. A negative result does not preclude SARS-CoV-2 infection  and should not be used as the sole basis for treatment or other  patient management decisions.  A negative result may occur with  improper specimen collection / handling, submission of specimen other  than nasopharyngeal swab, presence of viral mutation(s) within the  areas targeted by this assay, and inadequate number of viral copies  (<250 copies / mL). A negative result must be combined with clinical  observations, patient history, and epidemiological information. If result is POSITIVE SARS-CoV-2 target nucleic acids are DETECTED. The SARS-CoV-2 RNA is generally detectable in upper and lower  respiratory specimens dur ing the acute phase of infection.  Positive  results are indicative of active infection with SARS-CoV-2.  Clinical  correlation with patient history and other diagnostic information is  necessary to determine patient infection status.  Positive results do  not rule out bacterial infection or co-infection with other viruses. If result is PRESUMPTIVE POSTIVE SARS-CoV-2 nucleic acids MAY BE PRESENT.   A presumptive positive result was obtained on the submitted specimen  and confirmed on repeat testing.  While 2019 novel coronavirus  (SARS-CoV-2) nucleic acids may be present in the submitted sample  additional confirmatory testing may be necessary for epidemiological  and / or clinical management purposes  to differentiate between  SARS-CoV-2 and other Sarbecovirus currently known to infect humans.  If clinically indicated additional testing with an alternate test  methodology [phone]) is advised. The SARS-CoV-2 RNA is generally  detectable in upper and lower respiratory sp ecimens during the acute  phase of infection. The expected result is Negative. Fact  Sheet for Patients:  StrictlyIdeas.no Fact Sheet for Healthcare Providers: BankingDealers.co.za This test is not yet approved or cleared by the [place] FDA and has been authorized for detection and/or diagnosis of SARS-CoV-2 by FDA under an Emergency Use Authorization (EUA).  This EUA will remain in effect (meaning this test can be used) for the duration of the COVID-19 declaration under Section 564(b)(1) of the Act, 21 U.S.C. section 360bbb-3(b)(1), unless the authorization is terminated or revoked sooner. Performed at [facility], [address] [place],  [address]           IMAGING    Dg Chest Portable 1 View  Result Date: 04/27/2019 CLINICAL DATA:  Shortness of breath EXAM: PORTABLE CHEST 1 VIEW COMPARISON:  Chest radiograph dated 03/02/2019 FINDINGS: The heart remains enlarged. Vascular calcifications are seen in the aortic arch. A left subclavian approach cardiac device is redemonstrated. Median sternotomy wires are well aligned. Patchy bilateral interstitial and airspace opacities appear similar to 03/02/2019. There is no significant pleural effusion or pneumothorax. IMPRESSION: 1. No significant interval change in the bilateral interstitial and airspace opacities since 03/02/2019. These may represent pneumonia or edema. 2. Stable cardiomegaly. Aortic Atherosclerosis (ICD10-I70.0). Electronically Signed   By: Zerita Boers M.D.   On: 04/27/2019 13:51    CBC    Component Value Date/Time   WBC 11.2 (H) 04/27/2019 1327   RBC 3.86 (L) 04/27/2019 1327   HGB 9.6 (L) 04/27/2019 1327   HGB 10.5 (L) 09/28/2017 0928   HCT 32.7 (L) 04/27/2019 1327   HCT 33.2 (L) 09/28/2017 0928   PLT 296 04/27/2019 1327   PLT 287 09/28/2017 0928   MCV 84.7 04/27/2019 1327   MCV 87 09/28/2017 0928   MCV 88 09/10/2013 0421   MCH 24.9 (L) 04/27/2019 1327   MCHC 29.4 (L) 04/27/2019 1327   RDW 16.3 (H) 04/27/2019 1327   RDW 14.1  09/28/2017 0928   RDW 15.7 (H) 09/10/2013 0421   LYMPHSABS 1.1 04/27/2019 1327   LYMPHSABS 2.0 09/28/2017 0928   LYMPHSABS 1.2 09/10/2013 0421   MONOABS 0.6 04/27/2019 1327   MONOABS 0.7 09/10/2013 0421   EOSABS 0.0 04/27/2019 1327   EOSABS 0.2 09/28/2017 0928   EOSABS 0.2 09/10/2013 0421   BASOSABS 0.1 04/27/2019 1327   BASOSABS 0.0 09/28/2017 0928   BASOSABS 0.0 09/10/2013 0421   BMP Latest Ref Rng & Units 04/27/2019 03/05/2019 03/04/2019  Glucose 70 - 99 mg/dL 249(H) 132(H) 180(H)  BUN 8 - 23 mg/dL 30(H) 50(H) 51(H)  Creatinine 0.44 - 1.00 mg/dL 1.13(H) 1.28(H) 1.60(H)  BUN/Creat Ratio 12 - 28 - - -  Sodium 135 - 145 mmol/L 141 141 139  Potassium 3.5 - 5.1 mmol/L 3.9 4.2 4.4  Chloride 98 - 111 mmol/L 101 97(L) 99  CO2 22 - 32 mmol/L 27 35(H) 33(H)  Calcium 8.9 - 10.3 mg/dL 9.3 8.7(L) 8.4(L)      Indwelling Urinary Catheter continued, requirement due to   Reason to continue Indwelling Urinary Catheter strict Intake/Output monitoring for hemodynamic instability      ASSESSMENT AND PLAN SYNOPSIS   Severe ACUTE Hypoxic and Hypercapnic Respiratory Failure -continue Full MV support -continue Bronchodilator Therapy -Wean Fio2 and PEEP as tolerated -will perform SAT/SBT when respiratory parameters are met  ACUTE SYSTOLIC CARDIAC FAILURE-HFp EF -oxygen as needed -Lasix as tolerated -follow up cardiac enzymes as indicated   ACUTE KIDNEY INJURY/Renal Failure -follow chem 7 -follow UO -continue Foley Catheter-assess need -Avoid nephrotoxic agents -Recheck creatinine  -Renal ultrasound   CARDIAC ICU monitoring   GI GI PROPHYLAXIS as indicated  NUTRITIONAL STATUS DIET-->NPO Constipation protocol as indicated  ENDO - will use ICU hypoglycemic\Hyperglycemia protocol if indicated   ELECTROLYTES -follow labs as needed -replace as needed -pharmacy consultation and following   DVT/GI PRX ordered TRANSFUSIONS AS NEEDED MONITOR FSBS ASSESS the need for  LABS as needed   Critical Care Time devoted to patient care services described in this note is 35 minutes.   Overall, patient is critically ill, prognosis is guarded.  Patient with Multiorgan failure and at high risk for cardiac arrest and death.    Corrin Parker, M.D.  [facility]  Medical Director [place] Director [facility] Cardio-Pulmonary Department

## 2019-04-27 NOTE — ED Notes (Signed)
Pt placed on bipap at this time.

## 2019-04-27 NOTE — ED Provider Notes (Signed)
[facility] Emergency Department Provider Note    First MD Initiated Contact with Patient 04/27/19 1324     (approximate)  I have reviewed the triage vital signs and the nursing notes.   HISTORY  Chief Complaint Shortness of Breath    HPI Kimberly Peterson is a 85 y.o. female [place] past medical history presents the ER with acute respiratory distress and hypoxia.  States that she started feeling short of breath today.  Denies any fevers.  No productive cough.  Denies any chest pain.  No nausea or vomiting.    Past Medical History:  Diagnosis Date   CHF (congestive heart failure) (HCC)    Chronic kidney disease    COPD (chronic obstructive pulmonary disease) (HCC)    Diabetes mellitus without complication ([place])    Hypertension    History reviewed. No pertinent family history. Past Surgical History:  Procedure Laterality Date   aoric valve replacemet      st Jude   CATARACT EXTRACTION, BILATERAL     PACEMAKER PLACEMENT     VALVE REPLACEMENT     Patient Active Problem List   Diagnosis Date Noted   Acute and chronic respiratory failure with hypoxia ([place]) 03/01/2019   Acute respiratory failure with hypoxia ([place]) 02/07/2019   Type 2 diabetes with peripheral circulatory disorder, controlled ([place]) 10/17/2018   Complete heart block ([place]) 09/09/2018   Severe aortic valve stenosis 09/09/2018   Lymphedema 08/12/2018   Uncontrolled type 2 diabetes mellitus with hyperglycemia ([place]) 02/25/2018   Atrial fibrillation ([place]) 02/25/2018   Encounter for current long-term use of anticoagulants 02/25/2018   Dependence on supplemental oxygen 02/25/2018   Lower extremity edema 02/25/2018   CHF (congestive heart failure) ([place]) 12/04/2015   Diabetes mellitus ([place]) 12/04/2015   Chronic obstructive pulmonary disease ([place]) [id number]   Diastolic CHF, acute on chronic ([place]) 09/19/2015   Benign essential HTN 09/19/2015   Controlled diabetes  mellitus without complication, with long-term current use of insulin ([place]) 09/19/2015   Hyperlipidemia, mixed 09/04/2014   Pulmonary embolism ([place]) 09/23/2013      Prior to Admission medications   Medication Sig Start Date End Date Taking? Authorizing Provider  aspirin 81 MG tablet Take 81 mg by mouth daily.    [provider]  atorvastatin (LIPITOR) 10 MG tablet TAKE 1 TABLET BY MOUTH ONCE DAILY FOR CHOLESTEROL 12/06/18   Kendell Bane, NP  CARTIA XT 240 MG 24 hr capsule Take 1 capsule by mouth once daily 04/06/19   [name], MD  carvedilol (COREG) 6.25 MG tablet Take 1 tablet (6.25 mg total) by mouth 2 (two) times daily with a meal. 03/22/19 04/21/19  [name], MD  hydrALAZINE (APRESOLINE) 25 MG tablet TAKE 1 & 1/2 (ONE & ONE-HALF) TABLETS BY MOUTH THREE TIMES DAILY FOR BLOOD PRESSURE 03/10/19   Kendell Bane, NP  insulin aspart protamine- aspart (NOVOLOG MIX 70/30) (70-30) 100 UNIT/ML injection Inject 21-34 Units into the skin 2 (two) times daily with a meal. 34 units in morning and 21 units in the evening    [provider]  pantoprazole (PROTONIX) 40 MG tablet Take one tab po qd for stomach 03/22/19   [name], MD  torsemide [facility]) 20 MG tablet Take one tab po qd 03/22/19   [name], MD  warfarin (COUMADIN) 5 MG tablet TAKE ONE TABLET BY MOUTH ONCE DAILY AT 5-6PM AND TAKE ONE AND ONE-HALF TABLET BY MOUTH ON THURSDAY 02/22/19   Boscia,  Greer Ee, NP    Allergies Patient has no known allergies.    Social History Social History   Tobacco Use   Smoking status: Never Smoker   Smokeless tobacco: Never Used  Substance Use Topics   Alcohol use: No   Drug use: No    Review of Systems Patient denies headaches, rhinorrhea, blurry vision, numbness, shortness of breath, chest pain, edema, cough, abdominal pain, nausea, vomiting, diarrhea, dysuria, fevers, rashes or hallucinations unless otherwise stated above in  HPI. ____________________________________________   PHYSICAL EXAM:  VITAL SIGNS: Vitals:   04/27/19 1430 04/27/19 1500  BP: (!) 135/54 (!) 161/61  Pulse: 70 75  Resp: 16 11  Temp:    SpO2: 98% 100%    Constitutional: Alert and oriented. Ill appearing in moderate resp distress Eyes: Conjunctivae are normal.  Head: Atraumatic. Nose: No congestion/rhinnorhea. Mouth/Throat: Mucous membranes are moist.   Neck: No stridor. Painless ROM.  Cardiovascular: Normal rate, regular rhythm. Grossly normal heart sounds.  Good peripheral circulation. Respiratory: tachypnea with diminished posterior lung sounds and cracklesB. Gastrointestinal: Soft and nontender. No distention. No abdominal bruits. No CVA tenderness. Genitourinary:  Musculoskeletal: No lower extremity tenderness, 2+ edema  No joint effusions. Neurologic:  Normal speech and language. No gross focal neurologic deficits are appreciated. No facial droop Skin:  Skin is warm, dry and intact. No rash noted. Psychiatric: Mood and affect are normal. Speech and behavior are normal.  ____________________________________________   LABS (all labs ordered are listed, but only abnormal results are displayed)  Results for orders placed or performed during the hospital encounter of 04/27/19 (from the past 24 hour(s))  CBC with Differential/Platelet     Status: Abnormal   Collection Time: 04/27/19  1:27 PM  Result Value Ref Range   WBC 11.2 (H) 4.0 - 10.5 K/uL   RBC 3.86 (L) 3.87 - 5.11 MIL/uL   Hemoglobin 9.6 (L) 12.0 - 15.0 g/dL   HCT 32.7 (L) 36.0 - 46.0 %   MCV 84.7 80.0 - 100.0 fL   MCH 24.9 (L) 26.0 - 34.0 pg   MCHC 29.4 (L) 30.0 - 36.0 g/dL   RDW 16.3 (H) 11.5 - 15.5 %   Platelets 296 150 - 400 K/uL   nRBC 0.0 0.0 - 0.2 %   Neutrophils Relative % 83 %   Neutro Abs 9.4 (H) 1.7 - 7.7 K/uL   Lymphocytes Relative 10 %   Lymphs Abs 1.1 0.7 - 4.0 K/uL   Monocytes Relative 5 %   Monocytes Absolute 0.6 0.1 - 1.0 K/uL   Eosinophils  Relative 0 %   Eosinophils Absolute 0.0 0.0 - 0.5 K/uL   Basophils Relative 1 %   Basophils Absolute 0.1 0.0 - 0.1 K/uL   Immature Granulocytes 1 %   Abs Immature Granulocytes 0.09 (H) 0.00 - 0.07 K/uL  Comprehensive metabolic panel     Status: Abnormal   Collection Time: 04/27/19  1:27 PM  Result Value Ref Range   Sodium 141 135 - 145 mmol/L   Potassium 3.9 3.5 - 5.1 mmol/L   Chloride 101 98 - 111 mmol/L   CO2 27 22 - 32 mmol/L   Glucose, Bld 249 (H) 70 - 99 mg/dL   BUN 30 (H) 8 - 23 mg/dL   Creatinine, Ser 1.13 (H) 0.44 - 1.00 mg/dL   Calcium 9.3 8.9 - 10.3 mg/dL   Total Protein 8.7 (H) 6.5 - 8.1 g/dL   Albumin 3.8 3.5 - 5.0 g/dL   AST 24 15 - 41  U/L   ALT 12 0 - 44 U/L   Alkaline Phosphatase 78 38 - 126 U/L   Total Bilirubin 0.8 0.3 - 1.2 mg/dL   GFR calc non Af Amer 44 (L) >60 mL/min   GFR calc Af Amer 51 (L) >60 mL/min   Anion gap 13 5 - 15  Troponin I (High Sensitivity)     Status: Abnormal   Collection Time: 04/27/19  1:27 PM  Result Value Ref Range   Troponin I (High Sensitivity) 73 (H) <18 ng/L  Brain natriuretic peptide     Status: Abnormal   Collection Time: 04/27/19  1:27 PM  Result Value Ref Range   B Natriuretic Peptide 270.0 (H) 0.0 - 100.0 pg/mL  SARS Coronavirus 2 by RT PCR (hospital order, performed in [place] hospital lab) Nasopharyngeal Nasopharyngeal Swab     Status: None   Collection Time: 04/27/19  1:27 PM   Specimen: Nasopharyngeal Swab  Result Value Ref Range   SARS Coronavirus 2 NEGATIVE NEGATIVE   ____________________________________________  EKG My review and personal interpretation at Time: 13:30   Indication: sob  Rate: 100  Rhythm: sinus Axis: normal Other: nonspecific st abn, interpretation limited 2/2 baseline artifact ____________________________________________  RADIOLOGY  I personally reviewed all radiographic images ordered to evaluate for the above acute complaints and reviewed radiology reports and findings.  These findings  were personally discussed with the patient.  Please see medical record for radiology report.  ____________________________________________   PROCEDURES  Procedure(s) performed:  .Critical Care Performed by: Merlyn Lot, MD Authorized by: Merlyn Lot, MD   Critical care provider statement:    Critical care time (minutes):  30   Critical care time was exclusive of:  Separately billable procedures and treating other patients   Critical care was necessary to treat or prevent imminent or life-threatening deterioration of the following conditions:  Respiratory failure   Critical care was time spent personally by me on the following activities:  Development of treatment plan with patient or surrogate, discussions with consultants, evaluation of patient's response to treatment, examination of patient, obtaining history from patient or surrogate, ordering and performing treatments and interventions, ordering and review of laboratory studies, ordering and review of radiographic studies, pulse oximetry, re-evaluation of patient's condition and review of old charts      Critical Care performed: yes ____________________________________________   INITIAL IMPRESSION / [place] / ED COURSE  Pertinent labs & imaging results that were available during my care of the patient were reviewed by me and considered in my medical decision making (see chart for details).   DDX: Asthma, copd, CHF, pna, ptx, malignancy, Pe, anemia   Kimberly Peterson is a 85 y.o. who presents to the ED with acute respiratory failure with hypoxia as described above.  Patient afebrile mildly hypertensive lower extremity edema presentation concerning for congestive heart failure with flash pulmonary edema.  Patient started on nitro drip.  Placed on BiPAP for respiratory support.  Clinical Course as of Apr 26 1536  Wed Apr 27, 2019  1336 Given her hypertension respiratory failure with hypoxia chest x-ray  does show evidence of probable edema will place on BiPAP for respiratory support.  Will start on nitro drip.   [PR]  1445 Patient feels much more comfortable on BiPAP currently.  I do suspect hypertensive urgency and CHF over infectious process given lack of fever presentation being more consistent with CHF.   [PR]    Clinical Course User Index [PR] Merlyn Lot, MD  The patient was evaluated in Emergency Department today for the symptoms described in the history of present illness. He/she was evaluated in the context of the global COVID-19 pandemic, which necessitated consideration that the patient might be at risk for infection with the SARS-CoV-2 virus that causes COVID-19. Institutional protocols and algorithms that pertain to the evaluation of patients at risk for COVID-19 are in a state of rapid change based on information released by regulatory bodies including the CDC and federal and state organizations. These policies and algorithms were followed during the patient's care in the ED.  As part of my medical decision making, I reviewed the following data within the [place] notes reviewed and incorporated, Labs reviewed, notes from prior ED visits and [place] Controlled Substance Database   ____________________________________________   FINAL CLINICAL IMPRESSION(S) / ED DIAGNOSES  Final diagnoses:  Acute respiratory failure with hypoxia ([place])  Acute pulmonary edema ([place])      NEW MEDICATIONS STARTED DURING THIS VISIT:  New Prescriptions   No medications on file     Note:  This document was prepared using Dragon voice recognition software and may include unintentional dictation errors.    Merlyn Lot, MD 04/27/19 1537

## 2019-04-27 NOTE — ED Notes (Signed)
Pt placed on bedpan. Urinated x1 assist.

## 2019-04-27 NOTE — ED Notes (Signed)
Report given to ICU RN

## 2019-04-27 NOTE — ED Triage Notes (Signed)
Pt comes EMS from home with SOB. Pt has hx of CHF. Pt reports being fine last night then waking up this morning and increasingly becoming SOB. Pt went to the dentist this am then after that apt she became even more SOB and wanted to go to the hospital. CBG 270. Pt hypertensive at [id number] systolic. Pt normally on 3L [place] but was bumped up to 5L by daughter and was 88% when EMS arrived. Pt placed on nonrebreather and came up to 100%.

## 2019-04-27 NOTE — Progress Notes (Signed)
Per Dr. Mortimer Fries, stop Nitro drip.  As long as SBP less than 170 keep nitro drip off.  SBP currently 125.

## 2019-04-27 NOTE — ED Notes (Signed)
Light green tube sent to lab 

## 2019-04-27 NOTE — ED Notes (Signed)
purewick placed, pt educated on use  

## 2019-04-27 NOTE — Progress Notes (Signed)
Transported pt to ICU 5 while on Bipap without incident. Pt remains on Bipap and is tol well at this time. Report given to ICU RT.

## 2019-04-27 NOTE — H&P (Addendum)
Kimberly Peterson is an 85 y.o. female.   Chief Complaint: Shortness of breath HPI: The patient with past medical history of CHF, chronic kidney disease as well as diabetes, hypertension and COPD presents to the emergency department from home complaining of shortness of breath.  At the time she could talk she reported feeling fine before going to bed but awoke this morning with significant dyspnea.  Upon arrival to the emergency department she was found to be hypertensive and hypoxic requiring 5 L of oxygen by nasal cannula.  She was placed on nonrebreather before requiring BiPAP due to increased work of breathing.  Chest x-ray showed pulmonary edema.  Patient was given Lasix IV.  She is also found to have a dramatic rise in her troponin from 73-1 23.  She was started on a heparin drip prior to the emergency department staff calling the hospitalist service for admission.  Past Medical History:  Diagnosis Date  . CHF (congestive heart failure) ([place])   . Chronic kidney disease   . COPD (chronic obstructive pulmonary disease) ([place])   . Diabetes mellitus without complication ([place])   . Hypertension     Past Surgical History:  Procedure Laterality Date  . aoric valve replacemet      st Jude  . CATARACT EXTRACTION, BILATERAL    . PACEMAKER PLACEMENT    . VALVE REPLACEMENT      History reviewed. No pertinent family history.  Cannot gather history due to acuity of patient's medical condition  Social History:  reports that she has never smoked. She has never used smokeless tobacco. She reports that she does not drink alcohol or use drugs.  Allergies: No Known Allergies  Medications Prior to Admission  Medication Sig Dispense Refill  . aspirin 81 MG tablet Take 81 mg by mouth daily.    Marland Kitchen atorvastatin (LIPITOR) 10 MG tablet TAKE 1 TABLET BY MOUTH ONCE DAILY FOR CHOLESTEROL (Patient taking differently: Take 10 mg by mouth daily. ) 90 tablet 3  . CARTIA XT 240 MG 24 hr capsule Take 1 capsule by mouth  once daily (Patient taking differently: Take 240 mg by mouth daily. ) 90 capsule 1  . carvedilol (COREG) 6.25 MG tablet Take 1 tablet (6.25 mg total) by mouth 2 (two) times daily with a meal. 180 tablet 3  . hydrALAZINE (APRESOLINE) 25 MG tablet TAKE 1 & 1/2 (ONE & ONE-HALF) TABLETS BY MOUTH THREE TIMES DAILY FOR BLOOD PRESSURE (Patient taking differently: Take 37.5 mg by mouth 3 (three) times daily. ) 405 tablet 0  . insulin aspart protamine- aspart (NOVOLOG MIX 70/30) (70-30) 100 UNIT/ML injection Inject 21-34 Units into the skin See admin instructions. Inject 34u under the skin every morning and inject 21u under the skin every evening    . pantoprazole (PROTONIX) 40 MG tablet Take one tab po qd for stomach (Patient taking differently: Take 40 mg by mouth daily. ) 90 tablet 1  . potassium chloride SA (KLOR-CON) 20 MEQ tablet Take 20 mEq by mouth daily.    Marland Kitchen torsemide ([name]) 20 MG tablet Take one tab po qd (Patient taking differently: Take 20 mg by mouth daily. ) 30 tablet 5  . warfarin (COUMADIN) 5 MG tablet TAKE ONE TABLET BY MOUTH ONCE DAILY AT 5-6PM AND TAKE ONE AND ONE-HALF TABLET BY MOUTH ON THURSDAY (Patient taking differently: Take 5-7.5 mg by mouth See admin instructions. Take 1 tablet (5mg ) by mouth every Monday, Tuesday. Wednesday, Friday, Saturday and Sunday evening and take 1 tablets (  7.5mg ) by mouth every Thursday evening) 90 tablet 4    Results for orders placed or performed during the hospital encounter of 04/27/19 (from the past 48 hour(s))  CBC with Differential/Platelet     Status: Abnormal   Collection Time: 04/27/19  1:27 PM  Result Value Ref Range   WBC 11.2 (H) 4.0 - 10.5 K/uL   RBC 3.86 (L) 3.87 - 5.11 MIL/uL   Hemoglobin 9.6 (L) 12.0 - 15.0 g/dL   HCT 32.7 (L) 36.0 - 46.0 %   MCV 84.7 80.0 - 100.0 fL   MCH 24.9 (L) 26.0 - 34.0 pg   MCHC 29.4 (L) 30.0 - 36.0 g/dL   RDW 16.3 (H) 11.5 - 15.5 %   Platelets 296 150 - 400 K/uL   nRBC 0.0 0.0 - 0.2 %   Neutrophils  Relative % 83 %   Neutro Abs 9.4 (H) 1.7 - 7.7 K/uL   Lymphocytes Relative 10 %   Lymphs Abs 1.1 0.7 - 4.0 K/uL   Monocytes Relative 5 %   Monocytes Absolute 0.6 0.1 - 1.0 K/uL   Eosinophils Relative 0 %   Eosinophils Absolute 0.0 0.0 - 0.5 K/uL   Basophils Relative 1 %   Basophils Absolute 0.1 0.0 - 0.1 K/uL   Immature Granulocytes 1 %   Abs Immature Granulocytes 0.09 (H) 0.00 - 0.07 K/uL    Comment: Performed at [facility], [address] [place], [place] [address]  Comprehensive metabolic panel     Status: Abnormal   Collection Time: 04/27/19  1:27 PM  Result Value Ref Range   Sodium 141 135 - 145 mmol/L   Potassium 3.9 3.5 - 5.1 mmol/L   Chloride 101 98 - 111 mmol/L   CO2 27 22 - 32 mmol/L   Glucose, Bld 249 (H) 70 - 99 mg/dL   BUN 30 (H) 8 - 23 mg/dL   Creatinine, Ser 1.13 (H) 0.44 - 1.00 mg/dL   Calcium 9.3 8.9 - 10.3 mg/dL   Total Protein 8.7 (H) 6.5 - 8.1 g/dL   Albumin 3.8 3.5 - 5.0 g/dL   AST 24 15 - 41 U/L   ALT 12 0 - 44 U/L   Alkaline Phosphatase 78 38 - 126 U/L   Total Bilirubin 0.8 0.3 - 1.2 mg/dL   GFR calc non Af Amer 44 (L) >60 mL/min   GFR calc Af Amer 51 (L) >60 mL/min   Anion gap 13 5 - 15    Comment: Performed at [facility], [place], [place] [address]  Troponin I (High Sensitivity)     Status: Abnormal   Collection Time: 04/27/19  1:27 PM  Result Value Ref Range   Troponin I (High Sensitivity) 73 (H) <18 ng/L    Comment: (NOTE) Elevated high sensitivity troponin I (hsTnI) values and significant  changes across serial measurements may suggest ACS but many other  chronic and acute conditions are known to elevate hsTnI results.  Refer to the "Links" section for chest pain algorithms and additional  guidance. Performed at [facility], [address] [place], [place] [address]   Brain natriuretic peptide     Status: Abnormal   Collection Time: 04/27/19  1:27 PM  Result Value Ref Range   B  Natriuretic Peptide 270.0 (H) 0.0 - 100.0 pg/mL    Comment: Performed at [facility], [address] [place], [place] [address]  SARS Coronavirus 2 by RT PCR (hospital order, performed in [place]  hospital lab) Nasopharyngeal Nasopharyngeal Swab     Status: None   Collection Time: 04/27/19  1:27 PM   Specimen: Nasopharyngeal Swab  Result Value Ref Range   SARS Coronavirus 2 NEGATIVE NEGATIVE    Comment: (NOTE) If result is NEGATIVE SARS-CoV-2 target nucleic acids are NOT DETECTED. The SARS-CoV-2 RNA is generally detectable in upper and lower  respiratory specimens during the acute phase of infection. The lowest  concentration of SARS-CoV-2 viral copies this assay can detect is 250  copies / mL. A negative result does not preclude SARS-CoV-2 infection  and should not be used as the sole basis for treatment or other  patient management decisions.  A negative result may occur with  improper specimen collection / handling, submission of specimen other  than nasopharyngeal swab, presence of viral mutation(s) within the  areas targeted by this assay, and inadequate number of viral copies  (<250 copies / mL). A negative result must be combined with clinical  observations, patient history, and epidemiological information. If result is POSITIVE SARS-CoV-2 target nucleic acids are DETECTED. The SARS-CoV-2 RNA is generally detectable in upper and lower  respiratory specimens dur ing the acute phase of infection.  Positive  results are indicative of active infection with SARS-CoV-2.  Clinical  correlation with patient history and other diagnostic information is  necessary to determine patient infection status.  Positive results do  not rule out bacterial infection or co-infection with other viruses. If result is PRESUMPTIVE POSTIVE SARS-CoV-2 nucleic acids MAY BE PRESENT.   A presumptive positive result was obtained on the submitted specimen  and confirmed on repeat testing.   While 2019 novel coronavirus  (SARS-CoV-2) nucleic acids may be present in the submitted sample  additional confirmatory testing may be necessary for epidemiological  and / or clinical management purposes  to differentiate between  SARS-CoV-2 and other Sarbecovirus currently known to infect humans.  If clinically indicated additional testing with an alternate test  methodology [phone]) is advised. The SARS-CoV-2 RNA is generally  detectable in upper and lower respiratory sp ecimens during the acute  phase of infection. The expected result is Negative. Fact Sheet for Patients:  StrictlyIdeas.no Fact Sheet for Healthcare Providers: BankingDealers.co.za This test is not yet approved or cleared by the [place] FDA and has been authorized for detection and/or diagnosis of SARS-CoV-2 by FDA under an Emergency Use Authorization (EUA).  This EUA will remain in effect (meaning this test can be used) for the duration of the COVID-19 declaration under Section 564(b)(1) of the Act, 21 U.S.C. section 360bbb-3(b)(1), unless the authorization is terminated or revoked sooner. Performed at [facility], [address] [place], [place] [address]    Dg Chest Portable 1 View  Result Date: 04/27/2019 CLINICAL DATA:  Shortness of breath EXAM: PORTABLE CHEST 1 VIEW COMPARISON:  Chest radiograph dated 03/02/2019 FINDINGS: The heart remains enlarged. Vascular calcifications are seen in the aortic arch. A left subclavian approach cardiac device is redemonstrated. Median sternotomy wires are well aligned. Patchy bilateral interstitial and airspace opacities appear similar to 03/02/2019. There is no significant pleural effusion or pneumothorax. IMPRESSION: 1. No significant interval change in the bilateral interstitial and airspace opacities since 03/02/2019. These may represent pneumonia or edema. 2. Stable cardiomegaly. Aortic Atherosclerosis  (ICD10-I70.0). Electronically Signed   By: Zerita Boers M.D.   On: 04/27/2019 13:51    Review of Systems  Constitutional: Negative for chills and fever.  HENT: Negative for sore throat and tinnitus.   Eyes: Negative  for blurred vision and redness.  Respiratory: Positive for shortness of breath. Negative for cough.   Cardiovascular: Negative for chest pain, palpitations, orthopnea and PND.  Gastrointestinal: Negative for abdominal pain, diarrhea, nausea and vomiting.  Genitourinary: Negative for dysuria, frequency and urgency.  Musculoskeletal: Negative for joint pain and myalgias.  Skin: Negative for rash.       No lesions  Neurological: Negative for speech change, focal weakness and weakness.  Endo/Heme/Allergies: Does not bruise/bleed easily.       No temperature intolerance  Psychiatric/Behavioral: Negative for depression and suicidal ideas.    Blood pressure (!) 161/61, pulse 75, temperature 97.7 F (36.5 C), temperature source Axillary, resp. rate 11, height 5\' 2"  (1.575 m), weight 92.2 kg, SpO2 100 %. Physical Exam  Vitals reviewed. Constitutional: She is oriented to person, place, and time. She appears well-developed and well-nourished. No distress.  HENT:  Head: Normocephalic and atraumatic.  Mouth/Throat: Oropharynx is clear and moist.  Eyes: Pupils are equal, round, and reactive to light. Conjunctivae and EOM are normal. No scleral icterus.  Neck: Normal range of motion. Neck supple. No JVD present. No tracheal deviation present. No thyromegaly present.  Cardiovascular: Normal rate, regular rhythm and normal heart sounds. Exam reveals no gallop and no friction rub.  No murmur heard. Respiratory: Breath sounds normal. She is in respiratory distress.  GI: Soft. Bowel sounds are normal. She exhibits no distension. There is no abdominal tenderness.  Genitourinary:    Genitourinary Comments: Deferred   Musculoskeletal: Normal range of motion.        General: No edema.   Lymphadenopathy:    She has no cervical adenopathy.  Neurological: She is alert and oriented to person, place, and time. No cranial nerve deficit. She exhibits normal muscle tone.  Skin: Skin is warm and dry. No rash noted. No erythema.  Psychiatric: She has a normal mood and affect. Her behavior is normal. Judgment and thought content normal.     Assessment/Plan This is an 85 year old female admitted for CHF exacerbation. 1.  CHF: Acute on chronic; diastolic heart failure.  Continue diuresis as necessary.  The patient is currently on BiPAP.  Titrate oxygen down to home levels of 3 L/min or to maintain oxygen saturations 88 to 92%.  NSTEMI may be instigating event as the patient has some EKG changes and dramatically elevated troponin.  I have started her on a heparin drip.  Continue to monitor telemetry.  Consult cardiology. 2.  COPD: Oxygenation goal as above; continue BiPAP until air trapping is improved. 3.  Hypertension: Uncontrolled; continue hydralazine.  Initiate antihypertensive drip if necessary 4.  Diabetes mellitus type 2: Continue basal insulin therapy adjusted for hospital diet.  Sliding scale insulin while hospitalized. 5.  DVT prophylaxis: Heparin 6.  GI prophylaxis: Pantoprazole per home regimen The patient is a full code.  Time spent on admission orders and patient care approximately 45 minutes  Harrie Foreman, MD 04/27/2019, 3:53 PM

## 2019-04-28 ENCOUNTER — Other Ambulatory Visit: Payer: Self-pay | Admitting: *Deleted

## 2019-04-28 DIAGNOSIS — I5033 Acute on chronic diastolic (congestive) heart failure: Secondary | ICD-10-CM | POA: Diagnosis not present

## 2019-04-28 LAB — HEPARIN LEVEL (UNFRACTIONATED)
Heparin Unfractionated: 0.28 IU/mL — ABNORMAL LOW (ref 0.30–0.70)
Heparin Unfractionated: 0.31 IU/mL (ref 0.30–0.70)
Heparin Unfractionated: 0.48 IU/mL (ref 0.30–0.70)

## 2019-04-28 LAB — BASIC METABOLIC PANEL
Anion gap: 12 (ref 5–15)
BUN: 27 mg/dL — ABNORMAL HIGH (ref 8–23)
CO2: 29 mmol/L (ref 22–32)
Calcium: 9 mg/dL (ref 8.9–10.3)
Chloride: 103 mmol/L (ref 98–111)
Creatinine, Ser: 1.04 mg/dL — ABNORMAL HIGH (ref 0.44–1.00)
GFR calc Af Amer: 56 mL/min — ABNORMAL LOW (ref >60)
GFR calc non Af Amer: 48 mL/min — ABNORMAL LOW (ref >60)
Glucose, Bld: 141 mg/dL — ABNORMAL HIGH (ref 70–99)
Potassium: 3.7 mmol/L (ref 3.5–5.1)
Sodium: 144 mmol/L (ref 135–145)

## 2019-04-28 LAB — CBC
HCT: 29.6 % — ABNORMAL LOW (ref 36.0–46.0)
Hemoglobin: 8.5 g/dL — ABNORMAL LOW (ref 12.0–15.0)
MCH: 24.5 pg — ABNORMAL LOW (ref 26.0–34.0)
MCHC: 28.7 g/dL — ABNORMAL LOW (ref 30.0–36.0)
MCV: 85.3 fL (ref 80.0–100.0)
Platelets: 258 10*3/uL (ref 150–400)
RBC: 3.47 MIL/uL — ABNORMAL LOW (ref 3.87–5.11)
RDW: 16.2 % — ABNORMAL HIGH (ref 11.5–15.5)
WBC: 8.2 10*3/uL (ref 4.0–10.5)
nRBC: 0 % (ref 0.0–0.2)

## 2019-04-28 LAB — GLUCOSE, CAPILLARY
Glucose-Capillary: 123 mg/dL — ABNORMAL HIGH (ref 70–99)
Glucose-Capillary: 162 mg/dL — ABNORMAL HIGH (ref 70–99)
Glucose-Capillary: 127 mg/dL — ABNORMAL HIGH (ref 70–99)
Glucose-Capillary: 154 mg/dL — ABNORMAL HIGH (ref 70–99)

## 2019-04-28 LAB — BRAIN NATRIURETIC PEPTIDE: B Natriuretic Peptide: 308 pg/mL — ABNORMAL HIGH (ref 0.0–100.0)

## 2019-04-28 MED ORDER — FUROSEMIDE 10 MG/ML IJ SOLN
20.0000 mg | Freq: Once | INTRAMUSCULAR | Status: AC
  Administered 2019-04-28: 20 mg via INTRAVENOUS
  Filled 2019-04-28: qty 2

## 2019-04-28 MED ORDER — HEPARIN BOLUS VIA INFUSION
1000.0000 [IU] | Freq: Once | INTRAVENOUS | Status: AC
  Administered 2019-04-28: 19:00:00 1000 [IU] via INTRAVENOUS
  Filled 2019-04-28: qty 1000

## 2019-04-28 NOTE — Consult Note (Signed)
ANTICOAGULATION CONSULT NOTE   Pharmacy Consult for Heparin Dosing  Indication: chest pain/ACS  No Known Allergies  Patient Measurements: Height: 5\' 2"  (157.5 cm) Weight: 197 lb 5 oz (89.5 kg) IBW/kg (Calculated) : 50.1 Heparin Dosing Weight: 69.4 kg   Vital Signs: Temp: 98.3 F (36.8 C) (10/22 0713) Temp Source: Axillary (10/22 0200) BP: 159/69 (10/22 0900) Pulse Rate: 91 (10/22 0900)  Labs: Recent Labs    04/27/19 1327 04/27/19 1531 04/27/19 1714 04/28/19 0016 04/28/19 0528 04/28/19 0805  HGB 9.6*  --   --   --  8.5*  --   HCT 32.7*  --   --   --  29.6*  --   PLT 296  --   --   --  258  --   APTT  --   --  39*  --   --   --   LABPROT  --   --  24.2*  --   --   --   INR  --   --  2.2*  --   --   --   HEPARINUNFRC  --   --   --  0.48  --  0.31  CREATININE 1.13*  --   --   --  1.04*  --   TROPONINIHS 73* 123*  --   --   --   --     Estimated Creatinine Clearance: 39.6 mL/min (A) (by C-G formula based on SCr of 1.04 mg/dL (H)).   Medications:  Warfarin PTA medication - last take 10/20 @ 6 pm.   Assessment: Patient presented to the ED with acute respiratory distress and hypoxia. Patient has a PMH significant for CHF, CKD, COPD, HTN, DM, and HTN. Complaints of SOB began today yet patient had no complaints of chest pain on presentation. BL INR/PT and aPTT will need to be obtained.   Given last dose has been ~ 24 hours, it is okay to start heparin.   Hgb 9.6  (though since August 2020 Hgb 9.7-11.3); Per Dr. Marcille Blanco okay to give bolus.   Troponin 73 > 123   Goal of Therapy:  Heparin level 0.3-0.7 units/ml Monitor platelets by anticoagulation protocol: Yes   Plan:  10/22 0805 HL 0.31, therapeutic x 2 but level has trended down. Rather than changing rate, will continue heparin at 800 units/hr and recheck another level to confirm. Will adjust rate at that time if necessary. If level is therapeutic at that time, will move to checking levels once daily with morning  labs.  Dorena Bodo, PharmD Clinical Pharmacist 04/28/2019,10:58 AM

## 2019-04-28 NOTE — Consult Note (Signed)
ANTICOAGULATION CONSULT NOTE   Pharmacy Consult for Heparin Dosing  Indication: chest pain/ACS  No Known Allergies  Patient Measurements: Height: 5\' 2"  (157.5 cm) Weight: 199 lb 15.3 oz (90.7 kg) IBW/kg (Calculated) : 50.1 Heparin Dosing Weight: 69.4 kg   Vital Signs: Temp: 98 F (36.7 C) (10/21 2000) Temp Source: Axillary (10/21 2000) BP: 137/55 (10/22 0100) Pulse Rate: 70 (10/22 0100)  Labs: Recent Labs    04/27/19 1327 04/27/19 1531 04/27/19 1714 04/28/19 0016  HGB 9.6*  --   --   --   HCT 32.7*  --   --   --   PLT 296  --   --   --   APTT  --   --  39*  --   LABPROT  --   --  24.2*  --   INR  --   --  2.2*  --   HEPARINUNFRC  --   --   --  0.48  CREATININE 1.13*  --   --   --   TROPONINIHS 73* 123*  --   --     Estimated Creatinine Clearance: 36.7 mL/min (A) (by C-G formula based on SCr of 1.13 mg/dL (H)).   Medications:  Warfarin PTA medication - last take 10/20 @ 6 pm.   Assessment: Patient presented to the ED with acute respiratory distress and hypoxia. Patient has a PMH significant for CHF, CKD, COPD, HTN, DM, and HTN. Complaints of SOB began today yet patient had no complaints of chest pain on presentation. BL INR/PT and aPTT will need to be obtained.   Given last dose has been ~ 24 hours, it is okay to start heparin.   Hgb 9.6  (though since August 2020 Hgb 9.7-11.3); Per Dr. Marcille Blanco okay to give bolus.   Troponin 73 > 123   Goal of Therapy:  Heparin level 0.3-0.7 units/ml Monitor platelets by anticoagulation protocol: Yes   Plan:  10/22 @ 0030 HL 0.48 therapeutic. Will continue current rate at 800 units/hr and will recheck HL @ 0800, CBC check w/ am labs, will continue to monitor.  Tobie Lords, PharmD, BCPS Clinical Pharmacist 04/28/2019,1:28 AM

## 2019-04-28 NOTE — Progress Notes (Signed)
[place] [place] NAME: Kimberly Peterson    MR#:  [id number]  DATE OF BIRTH:  [birth year]  SUBJECTIVE:  CHIEF COMPLAINT:   Chief Complaint  Patient presents with  . Shortness of Breath   Off bipap. Wear 2 L O2 at home SOB better.   REVIEW OF SYSTEMS:    Review of Systems  Constitutional: Positive for malaise/fatigue. Negative for chills and fever.  HENT: Negative for sore throat.   Eyes: Negative for blurred vision, double vision and pain.  Respiratory: Positive for cough and shortness of breath. Negative for hemoptysis and wheezing.   Cardiovascular: Negative for chest pain, palpitations, orthopnea and leg swelling.  Gastrointestinal: Negative for abdominal pain, constipation, diarrhea, heartburn, nausea and vomiting.  Genitourinary: Negative for dysuria and hematuria.  Musculoskeletal: Negative for back pain and joint pain.  Skin: Negative for rash.  Neurological: Negative for sensory change, speech change, focal weakness and headaches.  Endo/Heme/Allergies: Does not bruise/bleed easily.  Psychiatric/Behavioral: Negative for depression. The patient is not nervous/anxious.     DRUG ALLERGIES:  No Known Allergies  VITALS:  Blood pressure (!) 159/69, pulse 91, temperature 98.3 F (36.8 C), resp. rate (!) 23, height 5\' 2"  (1.575 m), weight 89.5 kg, SpO2 95 %.  PHYSICAL EXAMINATION:   Physical Exam  GENERAL:  85 y.o.-year-old patient lying in the bed with no acute distress. Obese EYES: Pupils equal, round, reactive to light and accommodation. No scleral icterus. Extraocular muscles intact.  HEENT: Head atraumatic, normocephalic. Oropharynx and nasopharynx clear.  NECK:  Supple, no jugular venous distention. No thyroid enlargement, no tenderness.  LUNGS: bilateral crackles. Conversational dyspnea CARDIOVASCULAR: S1, S2 normal. No murmurs, rubs, or gallops.  ABDOMEN: Soft, nontender, nondistended. Bowel sounds present. No  organomegaly or mass.  EXTREMITIES: No cyanosis, clubbing or edema b/l.    NEUROLOGIC: Cranial nerves II through XII are intact. No focal Motor or sensory deficits b/l.   PSYCHIATRIC: The patient is alert and oriented x 3.  SKIN: No obvious rash, lesion, or ulcer.   LABORATORY PANEL:   CBC Recent Labs  Lab 04/28/19 0528  WBC 8.2  HGB 8.5*  HCT 29.6*  PLT 258   ------------------------------------------------------------------------------------------------------------------ Chemistries  Recent Labs  Lab 04/27/19 1327 04/28/19 0528  NA 141 144  K 3.9 3.7  CL 101 103  CO2 27 29  GLUCOSE 249* 141*  BUN 30* 27*  CREATININE 1.13* 1.04*  CALCIUM 9.3 9.0  AST 24  --   ALT 12  --   ALKPHOS 78  --   BILITOT 0.8  --    ------------------------------------------------------------------------------------------------------------------  Cardiac Enzymes No results for input(s): TROPONINI in the last 168 hours. ------------------------------------------------------------------------------------------------------------------  RADIOLOGY:  Dg Chest Portable 1 View  Result Date: 04/27/2019 CLINICAL DATA:  Shortness of breath EXAM: PORTABLE CHEST 1 VIEW COMPARISON:  Chest radiograph dated 03/02/2019 FINDINGS: The heart remains enlarged. Vascular calcifications are seen in the aortic arch. A left subclavian approach cardiac device is redemonstrated. Median sternotomy wires are well aligned. Patchy bilateral interstitial and airspace opacities appear similar to 03/02/2019. There is no significant pleural effusion or pneumothorax. IMPRESSION: 1. No significant interval change in the bilateral interstitial and airspace opacities since 03/02/2019. These may represent pneumonia or edema. 2. Stable cardiomegaly. Aortic Atherosclerosis (ICD10-I70.0). Electronically Signed   By: Zerita Boers M.D.   On: 04/27/2019 13:51     ASSESSMENT AND PLAN:   This is an 85 year old female admitted for CHF  exacerbation.  1.  Acute on chronic diastolic congestive heart failure with acute on chronic hypoxic resp failure POA Continue further inpatient diuresis. Move to Telemetry floor when ready. Consulted cardiology. Appreciate ICU help  2.  COPD: Nebs ad inhalers 3.  Hypertension: Uncontrolled; continue hydralazine.    4.  Diabetes mellitus type 2 SSI, Diabetic diet  All the records are reviewed and case discussed with Care Management/Social Worker Management plans discussed with the patient, family and they are in agreement.  CODE STATUS: FULL CODE  DVT Prophylaxis: SCDs  TOTAL TIME TAKING CARE OF THIS PATIENT: 35 minutes.   POSSIBLE D/C IN 2-3 DAYS, DEPENDING ON CLINICAL CONDITION.  Kimberly Peterson Kimberly Peterson M.D on 04/28/2019 at 12:34 PM  Between 7am to 6pm - Pager - [phone]  After 6pm go to [email or web address] - password [email or web address] [facility]  Office  [phone]  CC: Primary care physician; Kimberly Guise, MD  Note: This dictation was prepared with Dragon dictation along with smaller phrase technology. Any transcriptional errors that result from this process are unintentional.

## 2019-04-28 NOTE — Patient Outreach (Signed)
[facility]) Care Management  04/28/2019  Kimberly Peterson 01-Apr-1930 [id number]   Noted that member admitted to hospital yesterday with shortness of breath.  Hospital liaisons notified, will follow up pending discharge.  Valente David, [place], MSN [place] [phone]

## 2019-04-28 NOTE — Consult Note (Signed)
ANTICOAGULATION CONSULT NOTE   Pharmacy Consult for Heparin Dosing  Indication: chest pain/ACS  No Known Allergies  Patient Measurements: Height: 5\' 2"  (157.5 cm) Weight: 197 lb 5 oz (89.5 kg) IBW/kg (Calculated) : 50.1 Heparin Dosing Weight: 69.4 kg   Vital Signs: Temp: 98.6 F (37 C) (10/22 1400) BP: 161/66 (10/22 1700) Pulse Rate: 97 (10/22 1700)  Labs: Recent Labs    04/27/19 1327 04/27/19 1531 04/27/19 1714 04/28/19 0016 04/28/19 0528 04/28/19 0805 04/28/19 1727  HGB 9.6*  --   --   --  8.5*  --   --   HCT 32.7*  --   --   --  29.6*  --   --   PLT 296  --   --   --  258  --   --   APTT  --   --  39*  --   --   --   --   LABPROT  --   --  24.2*  --   --   --   --   INR  --   --  2.2*  --   --   --   --   HEPARINUNFRC  --   --   --  0.48  --  0.31 0.28*  CREATININE 1.13*  --   --   --  1.04*  --   --   TROPONINIHS 73* 123*  --   --   --   --   --     Estimated Creatinine Clearance: 39.6 mL/min (A) (by C-G formula based on SCr of 1.04 mg/dL (H)).   Medications:  Warfarin PTA medication - last take 10/20 @ 6 pm.   Assessment: Patient presented to the ED with acute respiratory distress and hypoxia. Patient has a PMH significant for CHF, CKD, COPD, HTN, DM, and HTN. Complaints of SOB began today yet patient had no complaints of chest pain on presentation. BL INR/PT and aPTT will need to be obtained.   Given last dose has been ~ 24 hours, it is okay to start heparin.   Hgb 9.6  (though since August 2020 Hgb 9.7-11.3); Per Dr. Marcille Blanco okay to give bolus.   Troponin 73 > 123  10/22 0805 HL 0.31, therapeutic x 2 but level has trended down. Rather than changing rate, will continue heparin at 800 units/hr and recheck another level to confirm. Will adjust rate at that time if necessary. If level is therapeutic at that time, will move to checking levels once daily with morning labs   Goal of Therapy:  Heparin level 0.3-0.7 units/ml Monitor platelets by  anticoagulation protocol: Yes   Plan:  10/22 1727 HL 0.28, will order Heparin bolus of 1000 units and increase heparin rate to 950 units/hr and recheck another level to confirm in 8 hrs.  Hgb 8.5, plt [place] PharmD Clinical Pharmacist 04/28/2019

## 2019-04-28 NOTE — Consult Note (Signed)
CRITICAL CARE NOTE  CC  Follow up resp failure  SUBJECTIVE Remains on biPAP High risk for intubation Attempt to wean off biPAP Will give extra dose of lasix    BP (!) 144/56   Pulse 71   Temp 98.2 F (36.8 C) (Axillary)   Resp 18   Ht 5\' 2"  (1.575 m)   Wt 89.5 kg   SpO2 98%   BMI 36.09 kg/m    I/O last 3 completed shifts: In: 147.7 [I.V.:147.7] Out: 1000 [Urine:1000] No intake/output data recorded.  SpO2: 98 % O2 Flow Rate (L/min): 8 L/min FiO2 (%): 28 %   SIGNIFICANT EVENTS 10/21 admitted for severe resp failure diastolic heart failure [id number] remains on biPAP  REVIEW OF SYSTEMS  PATIENT IS UNABLE TO PROVIDE COMPLETE REVIEW OF SYSTEM S DUE TO SEVERE CRITICAL ILLNESS AND ENCEPHALOPATHY   PHYSICAL EXAMINATION:  GENERAL:critically ill appearing, +resp distress HEAD: Normocephalic, atraumatic.  EYES: Pupils equal, round, reactive to light.  No scleral icterus.  MOUTH: Moist mucosal membrane. NECK: Supple. No thyromegaly. No nodules. No JVD.  PULMONARY: +rhonchi, +wheezing CARDIOVASCULAR: S1 and S2. Regular rate and rhythm. No murmurs, rubs, or gallops.  GASTROINTESTINAL: Soft, nontender, -distended. No masses. Positive bowel sounds. No hepatosplenomegaly.  MUSCULOSKELETAL: No swelling, clubbing, or edema.  NEUROLOGIC: obtunded SKIN:intact,warm,dry    CULTURE RESULTS   Recent Results (from the past 240 hour(s))  SARS Coronavirus 2 by RT PCR (hospital order, performed in [facility] hospital lab) Nasopharyngeal Nasopharyngeal Swab     Status: None   Collection Time: 04/27/19  1:27 PM   Specimen: Nasopharyngeal Swab  Result Value Ref Range Status   SARS Coronavirus 2 NEGATIVE NEGATIVE Final    Comment: (NOTE) If result is NEGATIVE SARS-CoV-2 target nucleic acids are NOT DETECTED. The SARS-CoV-2 RNA is generally detectable in upper and lower  respiratory specimens during the acute phase of infection. The lowest  concentration of SARS-CoV-2 viral copies  this assay can detect is 250  copies / mL. A negative result does not preclude SARS-CoV-2 infection  and should not be used as the sole basis for treatment or other  patient management decisions.  A negative result may occur with  improper specimen collection / handling, submission of specimen other  than nasopharyngeal swab, presence of viral mutation(s) within the  areas targeted by this assay, and inadequate number of viral copies  (<250 copies / mL). A negative result must be combined with clinical  observations, patient history, and epidemiological information. If result is POSITIVE SARS-CoV-2 target nucleic acids are DETECTED. The SARS-CoV-2 RNA is generally detectable in upper and lower  respiratory specimens dur ing the acute phase of infection.  Positive  results are indicative of active infection with SARS-CoV-2.  Clinical  correlation with patient history and other diagnostic information is  necessary to determine patient infection status.  Positive results do  not rule out bacterial infection or co-infection with other viruses. If result is PRESUMPTIVE POSTIVE SARS-CoV-2 nucleic acids MAY BE PRESENT.   A presumptive positive result was obtained on the submitted specimen  and confirmed on repeat testing.  While 2019 novel coronavirus  (SARS-CoV-2) nucleic acids may be present in the submitted sample  additional confirmatory testing may be necessary for epidemiological  and / or clinical management purposes  to differentiate between  SARS-CoV-2 and other Sarbecovirus currently known to infect humans.  If clinically indicated additional testing with an alternate test  methodology [phone]) is advised. The SARS-CoV-2 RNA is generally  detectable in upper  and lower respiratory sp ecimens during the acute  phase of infection. The expected result is Negative. Fact Sheet for Patients:  StrictlyIdeas.no Fact Sheet for Healthcare  Providers: BankingDealers.co.za This test is not yet approved or cleared by the [place] FDA and has been authorized for detection and/or diagnosis of SARS-CoV-2 by FDA under an Emergency Use Authorization (EUA).  This EUA will remain in effect (meaning this test can be used) for the duration of the COVID-19 declaration under Section 564(b)(1) of the Act, 21 U.S.C. section 360bbb-3(b)(1), unless the authorization is terminated or revoked sooner. Performed at [facility], [address] [place], [place] [address]   MRSA PCR Screening     Status: None   Collection Time: 04/27/19  5:15 PM   Specimen: Nasal Mucosa; Nasopharyngeal  Result Value Ref Range Status   MRSA by PCR NEGATIVE NEGATIVE Final    Comment:        The GeneXpert MRSA Assay (FDA approved for NASAL specimens only), is one component of a comprehensive MRSA colonization surveillance program. It is not intended to diagnose MRSA infection nor to guide or monitor treatment for MRSA infections. Performed at [facility], [address] [place], [place] [address]           IMAGING    Dg Chest Portable 1 View  Result Date: 04/27/2019 CLINICAL DATA:  Shortness of breath EXAM: PORTABLE CHEST 1 VIEW COMPARISON:  Chest radiograph dated 03/02/2019 FINDINGS: The heart remains enlarged. Vascular calcifications are seen in the aortic arch. A left subclavian approach cardiac device is redemonstrated. Median sternotomy wires are well aligned. Patchy bilateral interstitial and airspace opacities appear similar to 03/02/2019. There is no significant pleural effusion or pneumothorax. IMPRESSION: 1. No significant interval change in the bilateral interstitial and airspace opacities since 03/02/2019. These may represent pneumonia or edema. 2. Stable cardiomegaly. Aortic Atherosclerosis (ICD10-I70.0). Electronically Signed   By: Zerita Boers M.D.   On: 04/27/2019 13:51    CBC     Component Value Date/Time   WBC 8.2 04/28/2019 0528   RBC 3.47 (L) 04/28/2019 0528   HGB 8.5 (L) 04/28/2019 0528   HGB 10.5 (L) 09/28/2017 0928   HCT 29.6 (L) 04/28/2019 0528   HCT 33.2 (L) 09/28/2017 0928   PLT 258 04/28/2019 0528   PLT 287 09/28/2017 0928   MCV 85.3 04/28/2019 0528   MCV 87 09/28/2017 0928   MCV 88 09/10/2013 0421   MCH 24.5 (L) 04/28/2019 0528   MCHC 28.7 (L) 04/28/2019 0528   RDW 16.2 (H) 04/28/2019 0528   RDW 14.1 09/28/2017 0928   RDW 15.7 (H) 09/10/2013 0421   LYMPHSABS 1.1 04/27/2019 1327   LYMPHSABS 2.0 09/28/2017 0928   LYMPHSABS 1.2 09/10/2013 0421   MONOABS 0.6 04/27/2019 1327   MONOABS 0.7 09/10/2013 0421   EOSABS 0.0 04/27/2019 1327   EOSABS 0.2 09/28/2017 0928   EOSABS 0.2 09/10/2013 0421   BASOSABS 0.1 04/27/2019 1327   BASOSABS 0.0 09/28/2017 0928   BASOSABS 0.0 09/10/2013 0421   BMP Latest Ref Rng & Units 04/28/2019 04/27/2019 03/05/2019  Glucose 70 - 99 mg/dL 141(H) 249(H) 132(H)  BUN 8 - 23 mg/dL 27(H) 30(H) 50(H)  Creatinine 0.44 - 1.00 mg/dL 1.04(H) 1.13(H) 1.28(H)  BUN/Creat Ratio 12 - 28 - - -  Sodium 135 - 145 mmol/L 144 141 141  Potassium 3.5 - 5.1 mmol/L 3.7 3.9 4.2  Chloride 98 - 111 mmol/L 103 101 97(L)  CO2 22 - 32 mmol/L 29 27  35(H)  Calcium 8.9 - 10.3 mg/dL 9.0 9.3 8.7(L)      Indwelling Urinary Catheter continued, requirement due to   Reason to continue Indwelling Urinary Catheter strict Intake/Output monitoring for hemodynamic instability      ASSESSMENT AND PLAN SYNOPSIS  Severe ACUTE Hypoxic and Hypercapnic Respiratory Failure From acute diastolic heart failure Wean off biPAP High risk for intubation  ACUTE DIASTOLIC CARDIAC FAILURE-HFp EF Lasix as tolerated   ACUTE KIDNEY INJURY/Renal Failure -follow chem 7 -follow UO -continue Foley Catheter-assess need -Avoid nephrotoxic agents   CARDIAC ICU monitoring  NPO status    DVT/GI PRX ordered TRANSFUSIONS AS NEEDED MONITOR FSBS ASSESS the  need for LABS as needed   ELECTROLYTES -follow labs as needed -replace as needed -pharmacy consultation and following    Critical Care Time devoted to patient care services described in this note is 32 minutes.   Overall, patient is critically ill, prognosis is guarded.  Patient with Multiorgan failure and at high risk for cardiac arrest and death.    Corrin Parker, M.D.  [facility]  Medical Director [place] Director [facility] Cardio-Pulmonary Department

## 2019-04-29 ENCOUNTER — Inpatient Hospital Stay (HOSPITAL_COMMUNITY)
Admit: 2019-04-29 | Discharge: 2019-04-29 | Disposition: A | Discharge status: Home / Self Care | Payer: Medicare Other | Attending: Internal Medicine | Admitting: Internal Medicine

## 2019-04-29 DIAGNOSIS — I5033 Acute on chronic diastolic (congestive) heart failure: Secondary | ICD-10-CM | POA: Diagnosis not present

## 2019-04-29 DIAGNOSIS — I361 Nonrheumatic tricuspid (valve) insufficiency: Secondary | ICD-10-CM

## 2019-04-29 DIAGNOSIS — I340 Nonrheumatic mitral (valve) insufficiency: Secondary | ICD-10-CM

## 2019-04-29 LAB — BASIC METABOLIC PANEL
Anion gap: 11 (ref 5–15)
BUN: 24 mg/dL — ABNORMAL HIGH (ref 8–23)
CO2: 30 mmol/L (ref 22–32)
Calcium: 9.1 mg/dL (ref 8.9–10.3)
Chloride: 102 mmol/L (ref 98–111)
Creatinine, Ser: 1.03 mg/dL — ABNORMAL HIGH (ref 0.44–1.00)
GFR calc Af Amer: 57 mL/min — ABNORMAL LOW (ref >60)
GFR calc non Af Amer: 49 mL/min — ABNORMAL LOW (ref >60)
Glucose, Bld: 140 mg/dL — ABNORMAL HIGH (ref 70–99)
Potassium: 3.9 mmol/L (ref 3.5–5.1)
Sodium: 143 mmol/L (ref 135–145)

## 2019-04-29 LAB — GLUCOSE, CAPILLARY
Glucose-Capillary: 118 mg/dL — ABNORMAL HIGH (ref 70–99)
Glucose-Capillary: 217 mg/dL — ABNORMAL HIGH (ref 70–99)
Glucose-Capillary: 127 mg/dL — ABNORMAL HIGH (ref 70–99)
Glucose-Capillary: 216 mg/dL — ABNORMAL HIGH (ref 70–99)

## 2019-04-29 LAB — CBC
HCT: 27.9 % — ABNORMAL LOW (ref 36.0–46.0)
Hemoglobin: 8.2 g/dL — ABNORMAL LOW (ref 12.0–15.0)
MCH: 25.2 pg — ABNORMAL LOW (ref 26.0–34.0)
MCHC: 29.4 g/dL — ABNORMAL LOW (ref 30.0–36.0)
MCV: 85.6 fL (ref 80.0–100.0)
Platelets: 236 10*3/uL (ref 150–400)
RBC: 3.26 MIL/uL — ABNORMAL LOW (ref 3.87–5.11)
RDW: 16.2 % — ABNORMAL HIGH (ref 11.5–15.5)
WBC: 8.4 10*3/uL (ref 4.0–10.5)
nRBC: 0 % (ref 0.0–0.2)

## 2019-04-29 LAB — HEPARIN LEVEL (UNFRACTIONATED): Heparin Unfractionated: 0.41 IU/mL (ref 0.30–0.70)

## 2019-04-29 LAB — BRAIN NATRIURETIC PEPTIDE: B Natriuretic Peptide: 255 pg/mL — ABNORMAL HIGH (ref 0.0–100.0)

## 2019-04-29 MED ORDER — FUROSEMIDE 10 MG/ML IJ SOLN
40.0000 mg | Freq: Two times a day (BID) | INTRAMUSCULAR | Status: DC
  Administered 2019-04-29 – 2019-05-01 (×5): 40 mg via INTRAVENOUS
  Filled 2019-04-29 (×5): qty 4

## 2019-04-29 MED ORDER — ENOXAPARIN SODIUM 40 MG/0.4ML ~~LOC~~ SOLN
40.0000 mg | SUBCUTANEOUS | Status: DC
  Administered 2019-04-29 – 2019-04-30 (×2): 40 mg via SUBCUTANEOUS
  Filled 2019-04-29 (×2): qty 0.4

## 2019-04-29 NOTE — Consult Note (Signed)
ANTICOAGULATION CONSULT NOTE   Pharmacy Consult for Heparin Dosing  Indication: chest pain/ACS  No Known Allergies  Patient Measurements: Height: 5\' 2"  (157.5 cm) Weight: 197 lb 5 oz (89.5 kg) IBW/kg (Calculated) : 50.1 Heparin Dosing Weight: 69.4 kg   Vital Signs: Temp: 98.3 F (36.8 C) (10/23 0000) Temp Source: Oral (10/23 0000) BP: 145/59 (10/23 0100) Pulse Rate: 72 (10/23 0100)  Labs: Recent Labs    04/27/19 1327 04/27/19 1531 04/27/19 1714  04/28/19 0528 04/28/19 0805 04/28/19 1727 04/29/19 0245  HGB 9.6*  --   --   --  8.5*  --   --  8.2*  HCT 32.7*  --   --   --  29.6*  --   --  27.9*  PLT 296  --   --   --  258  --   --  236  APTT  --   --  39*  --   --   --   --   --   LABPROT  --   --  24.2*  --   --   --   --   --   INR  --   --  2.2*  --   --   --   --   --   HEPARINUNFRC  --   --   --    < >  --  0.31 0.28* 0.41  CREATININE 1.13*  --   --   --  1.04*  --   --  1.03*  TROPONINIHS 73* 123*  --   --   --   --   --   --    < > = values in this interval not displayed.    Estimated Creatinine Clearance: 40 mL/min (A) (by C-G formula based on SCr of 1.03 mg/dL (H)).   Medications:  Warfarin PTA medication - last take 10/20 @ 6 pm.   Assessment: Patient presented to the ED with acute respiratory distress and hypoxia. Patient has a PMH significant for CHF, CKD, COPD, HTN, DM, and HTN. Complaints of SOB began today yet patient had no complaints of chest pain on presentation. BL INR/PT and aPTT will need to be obtained.   Given last dose has been ~ 24 hours, it is okay to start heparin.   Hgb 9.6  (though since August 2020 Hgb 9.7-11.3); Per Dr. Marcille Blanco okay to give bolus.   Troponin 73 > 123  10/22 0805 HL 0.31, therapeutic x 2 but level has trended down. Rather than changing rate, will continue heparin at 800 units/hr and recheck another level to confirm. Will adjust rate at that time if necessary. If level is therapeutic at that time, will move to  checking levels once daily with morning labs   Goal of Therapy:  Heparin level 0.3-0.7 units/ml Monitor platelets by anticoagulation protocol: Yes   Plan:  10/23 @ 0300 HL 0.41 therapeutic. Will continue rate of 950 units/hr and will recheck HL at 1100, CBC stable will continue to monitor.  Tobie Lords, PharmD, BCPS Clinical Pharmacist 04/29/2019

## 2019-04-29 NOTE — Progress Notes (Signed)
Palliative:  Palliative care has received consult. Unfortunately unable to see until 05/02/19 d/t census and staffing. We apologize for the delay. Please consult and recommend outpatient palliative to follow if d/c prior to 05/02/19. Thank you for this consult.   Vinie Sill, NP Palliative Medicine Team Pager [phone] (Please see [email or web address] for schedule) Team Phone [phone]

## 2019-04-29 NOTE — Progress Notes (Signed)
Pt had an uneventful day, sat in chair comfortably and had daughter visit as well as updated. Pt has remained in NSR throughout shift, has remained AOx4. and has had good urine output with IV lasix. Pt is currently resting comfortably.  Waiting for med surg bed.

## 2019-04-29 NOTE — Progress Notes (Signed)
[place] at [place] NAME: Kimberly Peterson    MR#:  [id number]  DATE OF BIRTH:  1930/03/07  SUBJECTIVE:  CHIEF COMPLAINT:   Chief Complaint  Patient presents with  . Shortness of Breath    On 2 L O2 Sitting in a chair  Still has SOB but improvingg  REVIEW OF SYSTEMS:    Review of Systems  Constitutional: Positive for malaise/fatigue. Negative for chills and fever.  HENT: Negative for sore throat.   Eyes: Negative for blurred vision, double vision and pain.  Respiratory: Positive for cough and shortness of breath. Negative for hemoptysis and wheezing.   Cardiovascular: Negative for chest pain, palpitations, orthopnea and leg swelling.  Gastrointestinal: Negative for abdominal pain, constipation, diarrhea, heartburn, nausea and vomiting.  Genitourinary: Negative for dysuria and hematuria.  Musculoskeletal: Negative for back pain and joint pain.  Skin: Negative for rash.  Neurological: Negative for sensory change, speech change, focal weakness and headaches.  Endo/Heme/Allergies: Does not bruise/bleed easily.  Psychiatric/Behavioral: Negative for depression. The patient is not nervous/anxious.    DRUG ALLERGIES:  No Known Allergies  VITALS:  Blood pressure (!) 156/73, pulse 69, temperature 98.2 F (36.8 C), temperature source Oral, resp. rate 18, height 5\' 2"  (1.575 m), weight 88.7 kg, SpO2 98 %.  PHYSICAL EXAMINATION:   Physical Exam  GENERAL:  85 y.o.-year-old patient lying in the bed with no acute distress. Obese EYES: Pupils equal, round, reactive to light and accommodation. No scleral icterus. Extraocular muscles intact.  HEENT: Head atraumatic, normocephalic. Oropharynx and nasopharynx clear.  NECK:  Supple, no jugular venous distention. No thyroid enlargement, no tenderness.  LUNGS: bilateral crackles. Conversational dyspnea CARDIOVASCULAR: S1, S2 normal. No murmurs, rubs, or gallops.  ABDOMEN: Soft, nontender,  nondistended. Bowel sounds present. No organomegaly or mass.  EXTREMITIES: No cyanosis, clubbing or edema b/l.    NEUROLOGIC: Cranial nerves II through XII are intact. No focal Motor or sensory deficits b/l.   PSYCHIATRIC: The patient is alert and oriented x 3.  SKIN: No obvious rash, lesion, or ulcer.   LABORATORY PANEL:   CBC Recent Labs  Lab 04/29/19 0245  WBC 8.4  HGB 8.2*  HCT 27.9*  PLT 236   ------------------------------------------------------------------------------------------------------------------ Chemistries  Recent Labs  Lab 04/27/19 1327  04/29/19 0245  NA 141   < > 143  K 3.9   < > 3.9  CL 101   < > 102  CO2 27   < > 30  GLUCOSE 249*   < > 140*  BUN 30*   < > 24*  CREATININE 1.13*   < > 1.03*  CALCIUM 9.3   < > 9.1  AST 24  --   --   ALT 12  --   --   ALKPHOS 78  --   --   BILITOT 0.8  --   --    < > = values in this interval not displayed.   ------------------------------------------------------------------------------------------------------------------  Cardiac Enzymes No results for input(s): TROPONINI in the last 168 hours. ------------------------------------------------------------------------------------------------------------------  RADIOLOGY:  Dg Chest Portable 1 View  Result Date: 04/27/2019 CLINICAL DATA:  Shortness of breath EXAM: PORTABLE CHEST 1 VIEW COMPARISON:  Chest radiograph dated 03/02/2019 FINDINGS: The heart remains enlarged. Vascular calcifications are seen in the aortic arch. A left subclavian approach cardiac device is redemonstrated. Median sternotomy wires are well aligned. Patchy bilateral interstitial and airspace opacities appear similar to 03/02/2019. There is no significant pleural effusion or pneumothorax. IMPRESSION: 1.  No significant interval change in the bilateral interstitial and airspace opacities since 03/02/2019. These may represent pneumonia or edema. 2. Stable cardiomegaly. Aortic Atherosclerosis  (ICD10-I70.0). Electronically Signed   By: Zerita Boers M.D.   On: 04/27/2019 13:51     ASSESSMENT AND PLAN:   This is an 85 year old female admitted for CHF exacerbation.  1.  Acute on chronic diastolic congestive heart failure with acute on chronic hypoxic resp failure POA Continue further inpatient diuresis. Ordered lasix IV BID Move to Telemetry floor when ready. Appreciate cardiology input. Appreciate ICU help  2.  COPD: Nebs ad inhalers 3.  Hypertension: Improving  4.  Diabetes mellitus type 2 SSI, Diabetic diet  All the records are reviewed and case discussed with Care Management/Social Worker Management plans discussed with the patient, family and they are in agreement.  CODE STATUS: FULL CODE  DVT Prophylaxis: SCDs  TOTAL TIME TAKING CARE OF THIS PATIENT: 35 minutes.   POSSIBLE D/C IN 1-2 DAYS, DEPENDING ON CLINICAL CONDITION.  [name] Fortune Brannigan M.D on 04/29/2019 at 11:15 AM  Between 7am to 6pm - Pager - [phone]  After 6pm go to [email or web address] - password [email or web address] [place] [facility]  Office  [phone]  CC: Primary care physician; [name], MD  Note: This dictation was prepared with Dragon dictation along with smaller phrase technology. Any transcriptional errors that result from this process are unintentional.

## 2019-04-29 NOTE — Plan of Care (Signed)

## 2019-04-29 NOTE — Progress Notes (Addendum)
CRITICAL CARE NOTE  CC  follow up respiratory failure due to CHF exacerbation   SUBJECTIVE Patient SOB improving since admission. Currently breathing comfortably on 2L O2 [place] biPAP placed for presumed OSA/OHS tolerated well    BP (!) 166/71   Pulse (!) 114   Temp 98.2 F (36.8 C) (Oral)   Resp (!) 29   Ht 5\' 2"  (1.575 m)   Wt 88.7 kg   SpO2 97%   BMI 35.77 kg/m    I/O last 3 completed shifts: In: 315.1 [I.V.:315.1] Out: 1200 [Urine:1200] Total I/O In: -  Out: 300 [Urine:300]  SpO2: 97 % O2 Flow Rate (L/min): 2 L/min FiO2 (%): 28 %   SIGNIFICANT EVENTS 10/21 admitted for severe resp failure diastolic heart failure [id number] remains on biPAP 10/23 weaned from BiPAP. Currently on 2L [place]. Still on heparin drip   REVIEW OF SYSTEMS Review of Systems  Constitutional: Negative for chills, diaphoresis, fever and malaise/fatigue.  HENT: Negative for congestion, sinus pain and sore throat.   Eyes: Negative for blurred vision, double vision and photophobia.  Respiratory: Negative for cough and shortness of breath (None reported today).   Cardiovascular: Negative for chest pain, palpitations, orthopnea and leg swelling.  Gastrointestinal: Negative for abdominal pain, constipation, diarrhea, nausea and vomiting.  Genitourinary: Negative for dysuria.       Purewick catheter in place   Musculoskeletal: Negative for myalgias.  Neurological: Negative for dizziness, tingling, sensory change and headaches.     PHYSICAL EXAMINATION:  GENERAL: on , appears to be in no acute distress  HEAD: Normocephalic, atraumatic.  EYES: Pupils equal, round, reactive to light.  No scleral icterus.  MOUTH: Moist mucosal membrane. NECK: Supple.  PULMONARY: +rhonchi, +wheezing CARDIOVASCULAR: S1 and S2. Regular rate and rhythm. No murmurs, rubs, or gallops.  GASTROINTESTINAL: Soft, nontender, -distended. No masses. Positive bowel sounds. No hepatosplenomegaly.  MUSCULOSKELETAL: No swelling,  clubbing, or edema. SCDs bilaterally  NEUROLOGIC: A+Ox4.  SKIN:intact,warm,dry  MEDICATIONS: I have reviewed all medications and confirmed regimen as documented   CULTURE RESULTS   Recent Results (from the past 240 hour(s))  SARS Coronavirus 2 by RT PCR (hospital order, performed in [facility] hospital lab) Nasopharyngeal Nasopharyngeal Swab     Status: None   Collection Time: 04/27/19  1:27 PM   Specimen: Nasopharyngeal Swab  Result Value Ref Range Status   SARS Coronavirus 2 NEGATIVE NEGATIVE Final    Comment: (NOTE) If result is NEGATIVE SARS-CoV-2 target nucleic acids are NOT DETECTED. The SARS-CoV-2 RNA is generally detectable in upper and lower  respiratory specimens during the acute phase of infection. The lowest  concentration of SARS-CoV-2 viral copies this assay can detect is 250  copies / mL. A negative result does not preclude SARS-CoV-2 infection  and should not be used as the sole basis for treatment or other  patient management decisions.  A negative result may occur with  improper specimen collection / handling, submission of specimen other  than nasopharyngeal swab, presence of viral mutation(s) within the  areas targeted by this assay, and inadequate number of viral copies  (<250 copies / mL). A negative result must be combined with clinical  observations, patient history, and epidemiological information. If result is POSITIVE SARS-CoV-2 target nucleic acids are DETECTED. The SARS-CoV-2 RNA is generally detectable in upper and lower  respiratory specimens dur ing the acute phase of infection.  Positive  results are indicative of active infection with SARS-CoV-2.  Clinical  correlation with patient history and other diagnostic information  is  necessary to determine patient infection status.  Positive results do  not rule out bacterial infection or co-infection with other viruses. If result is PRESUMPTIVE POSTIVE SARS-CoV-2 nucleic acids MAY BE PRESENT.   A  presumptive positive result was obtained on the submitted specimen  and confirmed on repeat testing.  While 2019 novel coronavirus  (SARS-CoV-2) nucleic acids may be present in the submitted sample  additional confirmatory testing may be necessary for epidemiological  and / or clinical management purposes  to differentiate between  SARS-CoV-2 and other Sarbecovirus currently known to infect humans.  If clinically indicated additional testing with an alternate test  methodology [phone]) is advised. The SARS-CoV-2 RNA is generally  detectable in upper and lower respiratory sp ecimens during the acute  phase of infection. The expected result is Negative. Fact Sheet for Patients:  StrictlyIdeas.no Fact Sheet for Healthcare Providers: BankingDealers.co.za This test is not yet approved or cleared by the [place] FDA and has been authorized for detection and/or diagnosis of SARS-CoV-2 by FDA under an Emergency Use Authorization (EUA).  This EUA will remain in effect (meaning this test can be used) for the duration of the COVID-19 declaration under Section 564(b)(1) of the Act, 21 U.S.C. section 360bbb-3(b)(1), unless the authorization is terminated or revoked sooner. Performed at [facility], [address] [place], [place] [address]   MRSA PCR Screening     Status: None   Collection Time: 04/27/19  5:15 PM   Specimen: Nasal Mucosa; Nasopharyngeal  Result Value Ref Range Status   MRSA by PCR NEGATIVE NEGATIVE Final    Comment:        The GeneXpert MRSA Assay (FDA approved for NASAL specimens only), is one component of a comprehensive MRSA colonization surveillance program. It is not intended to diagnose MRSA infection nor to guide or monitor treatment for MRSA infections. Performed at [facility], [address] [place], [place] [address]    CBC    Component Value Date/Time   WBC 8.4 04/29/2019 0245    RBC 3.26 (L) 04/29/2019 0245   HGB 8.2 (L) 04/29/2019 0245   HGB 10.5 (L) 09/28/2017 0928   HCT 27.9 (L) 04/29/2019 0245   HCT 33.2 (L) 09/28/2017 0928   PLT 236 04/29/2019 0245   PLT 287 09/28/2017 0928   MCV 85.6 04/29/2019 0245   MCV 87 09/28/2017 0928   MCV 88 09/10/2013 0421   MCH 25.2 (L) 04/29/2019 0245   MCHC 29.4 (L) 04/29/2019 0245   RDW 16.2 (H) 04/29/2019 0245   RDW 14.1 09/28/2017 0928   RDW 15.7 (H) 09/10/2013 0421   LYMPHSABS 1.1 04/27/2019 1327   LYMPHSABS 2.0 09/28/2017 0928   LYMPHSABS 1.2 09/10/2013 0421   MONOABS 0.6 04/27/2019 1327   MONOABS 0.7 09/10/2013 0421   EOSABS 0.0 04/27/2019 1327   EOSABS 0.2 09/28/2017 0928   EOSABS 0.2 09/10/2013 0421   BASOSABS 0.1 04/27/2019 1327   BASOSABS 0.0 09/28/2017 0928   BASOSABS 0.0 09/10/2013 0421   BMP Latest Ref Rng & Units 04/29/2019 04/28/2019 04/27/2019  Glucose 70 - 99 mg/dL 140(H) 141(H) 249(H)  BUN 8 - 23 mg/dL 24(H) 27(H) 30(H)  Creatinine 0.44 - 1.00 mg/dL 1.03(H) 1.04(H) 1.13(H)  BUN/Creat Ratio 12 - 28 - - -  Sodium 135 - 145 mmol/L 143 144 141  Potassium 3.5 - 5.1 mmol/L 3.9 3.7 3.9  Chloride 98 - 111 mmol/L 102 103 101  CO2 22 - 32 mmol/L 30 29 27   Calcium 8.9 -  10.3 mg/dL 9.1 9.0 9.3      Indwelling Urinary Catheter continued, requirement due to   Reason to continue Indwelling Urinary Catheter strict Intake/Output monitoring for hemodynamic instability      ASSESSMENT AND PLAN SYNOPSIS   Severe ACUTE Hypoxic Respiratory Failure due to CHF exacerbation  slowly reoslving -continue on 2L [place] -Monitor SpO2, RR and patient's symptoms   Acute diastolic cardiac failure - HFpEF -oxygen as needed -Lasix as tolerated  -follow up cardiac enzymes as indicated   ACUTE KIDNEY INJURY/Renal Failure -follow chem 7 -follow UO -Avoid nephrotoxic agents -Continue on purewick catheter   CARDIAC -ICU monitoring -Discontinue heparin drip  -Consult cardiology   GI GI PROPHYLAXIS as  indicated  NUTRITIONAL STATUS DIET--> Reduced salt intake/cardiac diet as tolerated  Constipation protocol as indicated  ENDO - will use ICU hypoglycemic\Hyperglycemia protocol if indicated   ELECTROLYTES -follow labs as needed -replace as needed -pharmacy consultation and following   DVT/GI PRX ordered TRANSFUSIONS AS NEEDED MONITOR FSBS ASSESS the need for LABS as needed   Overall, patient condition has improved and she may be transferred to the floor. Consider palliative care consult for patient's end of life goals.    Corrin Parker, M.D.  [facility]  Medical Director [place] Director [facility] Cardio-Pulmonary Department

## 2019-04-30 LAB — CBC WITH DIFFERENTIAL/PLATELET
Abs Immature Granulocytes: 0.03 10*3/uL (ref 0.00–0.07)
Basophils Absolute: 0.1 10*3/uL (ref 0.0–0.1)
Basophils Relative: 1 %
Eosinophils Absolute: 0.4 10*3/uL (ref 0.0–0.5)
Eosinophils Relative: 6 %
HCT: 28.4 % — ABNORMAL LOW (ref 36.0–46.0)
Hemoglobin: 8.3 g/dL — ABNORMAL LOW (ref 12.0–15.0)
Immature Granulocytes: 0 %
Lymphocytes Relative: 24 %
Lymphs Abs: 1.7 10*3/uL (ref 0.7–4.0)
MCH: 25 pg — ABNORMAL LOW (ref 26.0–34.0)
MCHC: 29.2 g/dL — ABNORMAL LOW (ref 30.0–36.0)
MCV: 85.5 fL (ref 80.0–100.0)
Monocytes Absolute: 0.9 10*3/uL (ref 0.1–1.0)
Monocytes Relative: 13 %
Neutro Abs: 4 10*3/uL (ref 1.7–7.7)
Neutrophils Relative %: 56 %
Platelets: 234 10*3/uL (ref 150–400)
RBC: 3.32 MIL/uL — ABNORMAL LOW (ref 3.87–5.11)
RDW: 16 % — ABNORMAL HIGH (ref 11.5–15.5)
WBC: 7.1 10*3/uL (ref 4.0–10.5)
nRBC: 0 % (ref 0.0–0.2)

## 2019-04-30 LAB — BASIC METABOLIC PANEL
Anion gap: 7 (ref 5–15)
BUN: 26 mg/dL — ABNORMAL HIGH (ref 8–23)
CO2: 33 mmol/L — ABNORMAL HIGH (ref 22–32)
Calcium: 9 mg/dL (ref 8.9–10.3)
Chloride: 101 mmol/L (ref 98–111)
Creatinine, Ser: 1.28 mg/dL — ABNORMAL HIGH (ref 0.44–1.00)
GFR calc Af Amer: 44 mL/min — ABNORMAL LOW (ref >60)
GFR calc non Af Amer: 38 mL/min — ABNORMAL LOW (ref >60)
Glucose, Bld: 145 mg/dL — ABNORMAL HIGH (ref 70–99)
Potassium: 4.3 mmol/L (ref 3.5–5.1)
Sodium: 141 mmol/L (ref 135–145)

## 2019-04-30 LAB — GLUCOSE, CAPILLARY
Glucose-Capillary: 121 mg/dL — ABNORMAL HIGH (ref 70–99)
Glucose-Capillary: 160 mg/dL — ABNORMAL HIGH (ref 70–99)
Glucose-Capillary: 176 mg/dL — ABNORMAL HIGH (ref 70–99)
Glucose-Capillary: 172 mg/dL — ABNORMAL HIGH (ref 70–99)

## 2019-04-30 LAB — MAGNESIUM: Magnesium: 2.2 mg/dL (ref 1.7–2.4)

## 2019-04-30 NOTE — Progress Notes (Signed)
[place] [facility] [place] NAME: Kimberly Peterson    MR#:  [id number]  DATE OF BIRTH:  July 07, 1930  SUBJECTIVE:  CHIEF COMPLAINT:   Chief Complaint  Patient presents with  . Shortness of Breath    On 2 L O2 Sitting in a chair  Occasional cough Afebrile  REVIEW OF SYSTEMS:    Review of Systems  Constitutional: Positive for malaise/fatigue. Negative for chills and fever.  HENT: Negative for sore throat.   Eyes: Negative for blurred vision, double vision and pain.  Respiratory: Positive for cough and shortness of breath. Negative for hemoptysis and wheezing.   Cardiovascular: Negative for chest pain, palpitations, orthopnea and leg swelling.  Gastrointestinal: Negative for abdominal pain, constipation, diarrhea, heartburn, nausea and vomiting.  Genitourinary: Negative for dysuria and hematuria.  Musculoskeletal: Negative for back pain and joint pain.  Skin: Negative for rash.  Neurological: Negative for sensory change, speech change, focal weakness and headaches.  Endo/Heme/Allergies: Does not bruise/bleed easily.  Psychiatric/Behavioral: Negative for depression. The patient is not nervous/anxious.    DRUG ALLERGIES:  No Known Allergies  VITALS:  Blood pressure (!) 117/57, pulse 78, temperature 99 F (37.2 C), temperature source Axillary, resp. rate 18, height 5\' 2"  (1.575 m), weight 88.5 kg, SpO2 99 %.  PHYSICAL EXAMINATION:   Physical Exam  GENERAL:  85 y.o.-year-old patient lying in the bed with no acute distress. Obese. EYES: Pupils equal, round, reactive to light and accommodation. No scleral icterus. Extraocular muscles intact.  HEENT: Head atraumatic, normocephalic. Oropharynx and nasopharynx clear.  NECK:  Supple, no jugular venous distention. No thyroid enlargement, no tenderness.  LUNGS: bilateral crackles. Conversational dyspnea CARDIOVASCULAR: S1, S2 normal. No murmurs, rubs, or gallops.  ABDOMEN: Soft, nontender,  nondistended. Bowel sounds present. No organomegaly or mass.  EXTREMITIES: No cyanosis, clubbing or edema b/l.    NEUROLOGIC: Cranial nerves II through XII are intact. No focal Motor or sensory deficits b/l.   PSYCHIATRIC: The patient is alert and oriented x 3.  SKIN: No obvious rash, lesion, or ulcer.   LABORATORY PANEL:   CBC Recent Labs  Lab 04/30/19 0432  WBC 7.1  HGB 8.3*  HCT 28.4*  PLT 234   ------------------------------------------------------------------------------------------------------------------ Chemistries  Recent Labs  Lab 04/27/19 1327  04/30/19 0432  NA 141   < > 141  K 3.9   < > 4.3  CL 101   < > 101  CO2 27   < > 33*  GLUCOSE 249*   < > 145*  BUN 30*   < > 26*  CREATININE 1.13*   < > 1.28*  CALCIUM 9.3   < > 9.0  MG  --   --  2.2  AST 24  --   --   ALT 12  --   --   ALKPHOS 78  --   --   BILITOT 0.8  --   --    < > = values in this interval not displayed.   ------------------------------------------------------------------------------------------------------------------  Cardiac Enzymes No results for input(s): TROPONINI in the last 168 hours. ------------------------------------------------------------------------------------------------------------------  RADIOLOGY:  No results found.   ASSESSMENT AND PLAN:   This is an 85 year old female admitted for CHF exacerbation.  1.  Acute on chronic diastolic congestive heart failure with acute on chronic hypoxic resp failure POA Continue further inpatient diuresis. Ordered lasix IV BID Transfer to floor Appreciate cardiology input. Appreciate ICU help  2.  COPD: Nebs ad inhalers 3.  Hypertension: Improving  4.  Diabetes mellitus type 2 SSI, Diabetic diet  All the records are reviewed and case discussed with Care Management/Social Worker Management plans discussed with the patient, family and they are in agreement.  CODE STATUS: FULL CODE  DVT Prophylaxis: SCDs  TOTAL TIME TAKING  CARE OF THIS PATIENT: 35 minutes.   POSSIBLE D/C IN 1-2 DAYS, DEPENDING ON CLINICAL CONDITION.  [name] Darrell Leonhardt M.D on 04/30/2019 at 12:33 PM  Between 7am to 6pm - Pager - [phone]  After 6pm go to [email or web address] - password [email or web address] [facility]  Office  [phone]  CC: Primary care physician; [name], MD  Note: This dictation was prepared with Dragon dictation along with smaller phrase technology. Any transcriptional errors that result from this process are unintentional.

## 2019-04-30 NOTE — Progress Notes (Signed)
CRITICAL CARE NOTE  CC  follow up respiratory failure due to CHF exacerbation   SUBJECTIVE Patient SOB improving since admission. Currently breathing comfortably on 2L O2 [place] biPAP placed for presumed OSA/OHS tolerated well    BP 133/65   Pulse 86   Temp 98.3 F (36.8 C) (Oral)   Resp 17   Ht 5\' 2"  (1.575 m)   Wt 88.5 kg   SpO2 100%   BMI 35.69 kg/m    I/O last 3 completed shifts: In: 271.5 [P.O.:120; I.V.:151.5] Out: 2250 [Urine:2250] Total I/O In: 240 [P.O.:240] Out: 0   SpO2: 100 % O2 Flow Rate (L/min): 3 L/min FiO2 (%): 28 %   SIGNIFICANT EVENTS 10/21 admitted for severe resp failure diastolic heart failure [id number] remains on biPAP 10/23 weaned from BiPAP. Currently on 2L [place]. Still on heparin drip  10/24- medically optimizing for downgrade    REVIEW OF SYSTEMS Review of Systems  Constitutional: Negative for chills, diaphoresis, fever and malaise/fatigue.  HENT: Negative for congestion, sinus pain and sore throat.   Eyes: Negative for blurred vision, double vision and photophobia.  Respiratory: Negative for cough and shortness of breath (None reported today).   Cardiovascular: Negative for chest pain, palpitations, orthopnea and leg swelling.  Gastrointestinal: Negative for abdominal pain, constipation, diarrhea, nausea and vomiting.  Genitourinary: Negative for dysuria.       Purewick catheter in place   Musculoskeletal: Negative for myalgias.  Neurological: Negative for dizziness, tingling, sensory change and headaches.     PHYSICAL EXAMINATION:  GENERAL: on [place], appears to be in no acute distress  HEAD: Normocephalic, atraumatic.  EYES: Pupils equal, round, reactive to light.  No scleral icterus.  MOUTH: Moist mucosal membrane. NECK: Supple.  PULMONARY: decreased bs b/l +cabg scar CARDIOVASCULAR: S1 and S2. Regular rate and rhythm. No murmurs, rubs, or gallops.  GASTROINTESTINAL: Soft, nontender, -distended. No masses. Positive bowel sounds. No  hepatosplenomegaly.  MUSCULOSKELETAL: No swelling, clubbing, or edema. SCDs bilaterally  NEUROLOGIC: A+Ox4.  SKIN:intact,warm,dry  MEDICATIONS: I have reviewed all medications and confirmed regimen as documented   CULTURE RESULTS   Recent Results (from the past 240 hour(s))  SARS Coronavirus 2 by RT PCR (hospital order, performed in [facility] hospital lab) Nasopharyngeal Nasopharyngeal Swab     Status: None   Collection Time: 04/27/19  1:27 PM   Specimen: Nasopharyngeal Swab  Result Value Ref Range Status   SARS Coronavirus 2 NEGATIVE NEGATIVE Final    Comment: (NOTE) If result is NEGATIVE SARS-CoV-2 target nucleic acids are NOT DETECTED. The SARS-CoV-2 RNA is generally detectable in upper and lower  respiratory specimens during the acute phase of infection. The lowest  concentration of SARS-CoV-2 viral copies this assay can detect is 250  copies / mL. A negative result does not preclude SARS-CoV-2 infection  and should not be used as the sole basis for treatment or other  patient management decisions.  A negative result may occur with  improper specimen collection / handling, submission of specimen other  than nasopharyngeal swab, presence of viral mutation(s) within the  areas targeted by this assay, and inadequate number of viral copies  (<250 copies / mL). A negative result must be combined with clinical  observations, patient history, and epidemiological information. If result is POSITIVE SARS-CoV-2 target nucleic acids are DETECTED. The SARS-CoV-2 RNA is generally detectable in upper and lower  respiratory specimens dur ing the acute phase of infection.  Positive  results are indicative of active infection with SARS-CoV-2.  Clinical  correlation with patient history and other diagnostic information is  necessary to determine patient infection status.  Positive results do  not rule out bacterial infection or co-infection with other viruses. If result is PRESUMPTIVE  POSTIVE SARS-CoV-2 nucleic acids MAY BE PRESENT.   A presumptive positive result was obtained on the submitted specimen  and confirmed on repeat testing.  While 2019 novel coronavirus  (SARS-CoV-2) nucleic acids may be present in the submitted sample  additional confirmatory testing may be necessary for epidemiological  and / or clinical management purposes  to differentiate between  SARS-CoV-2 and other Sarbecovirus currently known to infect humans.  If clinically indicated additional testing with an alternate test  methodology [phone]) is advised. The SARS-CoV-2 RNA is generally  detectable in upper and lower respiratory sp ecimens during the acute  phase of infection. The expected result is Negative. Fact Sheet for Patients:  StrictlyIdeas.no Fact Sheet for Healthcare Providers: BankingDealers.co.za This test is not yet approved or cleared by the [place] FDA and has been authorized for detection and/or diagnosis of SARS-CoV-2 by FDA under an Emergency Use Authorization (EUA).  This EUA will remain in effect (meaning this test can be used) for the duration of the COVID-19 declaration under Section 564(b)(1) of the Act, 21 U.S.C. section 360bbb-3(b)(1), unless the authorization is terminated or revoked sooner. Performed at [facility], [address] [place], [place] [address]   MRSA PCR Screening     Status: None   Collection Time: 04/27/19  5:15 PM   Specimen: Nasal Mucosa; Nasopharyngeal  Result Value Ref Range Status   MRSA by PCR NEGATIVE NEGATIVE Final    Comment:        The GeneXpert MRSA Assay (FDA approved for NASAL specimens only), is one component of a comprehensive MRSA colonization surveillance program. It is not intended to diagnose MRSA infection nor to guide or monitor treatment for MRSA infections. Performed at [facility], [address] [place], [place] [address]    CBC     Component Value Date/Time   WBC 7.1 04/30/2019 0432   RBC 3.32 (L) 04/30/2019 0432   HGB 8.3 (L) 04/30/2019 0432   HGB 10.5 (L) 09/28/2017 0928   HCT 28.4 (L) 04/30/2019 0432   HCT 33.2 (L) 09/28/2017 0928   PLT 234 04/30/2019 0432   PLT 287 09/28/2017 0928   MCV 85.5 04/30/2019 0432   MCV 87 09/28/2017 0928   MCV 88 09/10/2013 0421   MCH 25.0 (L) 04/30/2019 0432   MCHC 29.2 (L) 04/30/2019 0432   RDW 16.0 (H) 04/30/2019 0432   RDW 14.1 09/28/2017 0928   RDW 15.7 (H) 09/10/2013 0421   LYMPHSABS 1.7 04/30/2019 0432   LYMPHSABS 2.0 09/28/2017 0928   LYMPHSABS 1.2 09/10/2013 0421   MONOABS 0.9 04/30/2019 0432   MONOABS 0.7 09/10/2013 0421   EOSABS 0.4 04/30/2019 0432   EOSABS 0.2 09/28/2017 0928   EOSABS 0.2 09/10/2013 0421   BASOSABS 0.1 04/30/2019 0432   BASOSABS 0.0 09/28/2017 0928   BASOSABS 0.0 09/10/2013 0421   BMP Latest Ref Rng & Units 04/30/2019 04/29/2019 04/28/2019  Glucose 70 - 99 mg/dL 145(H) 140(H) 141(H)  BUN 8 - 23 mg/dL 26(H) 24(H) 27(H)  Creatinine 0.44 - 1.00 mg/dL 1.28(H) 1.03(H) 1.04(H)  BUN/Creat Ratio 12 - 28 - - -  Sodium 135 - 145 mmol/L 141 143 144  Potassium 3.5 - 5.1 mmol/L 4.3 3.9 3.7  Chloride 98 - 111 mmol/L 101 102 103  CO2 22 -  32 mmol/L 33(H) 30 29  Calcium 8.9 - 10.3 mg/dL 9.0 9.1 9.0      Indwelling Urinary Catheter continued, requirement due to   Reason to continue Indwelling Urinary Catheter strict Intake/Output monitoring for hemodynamic instability      ASSESSMENT AND PLAN SYNOPSIS   Severe ACUTE Hypoxic Respiratory Failure due to CHF exacerbation  slowly reoslving -continue on 2L [place] -Monitor SpO2, RR and patient's symptoms   Acute diastolic cardiac failure - HFpEF -oxygen as needed -Lasix as tolerated  -follow up cardiac enzymes as indicated   ACUTE KIDNEY INJURY/Renal Failure -follow chem 7 -follow UO -Avoid nephrotoxic agents -Continue on purewick catheter   CARDIAC -ICU monitoring -Discontinue heparin  drip  -Consult cardiology   GI GI PROPHYLAXIS as indicated  NUTRITIONAL STATUS DIET--> Reduced salt intake/cardiac diet as tolerated  Constipation protocol as indicated  ENDO - will use ICU hypoglycemic\Hyperglycemia protocol if indicated   ELECTROLYTES -follow labs as needed -replace as needed -pharmacy consultation and following   DVT/GI PRX ordered TRANSFUSIONS AS NEEDED MONITOR FSBS ASSESS the need for LABS as needed      Ottie Glazier, M.D.  Pulmonary & [place]

## 2019-04-30 NOTE — Plan of Care (Signed)

## 2019-04-30 NOTE — Progress Notes (Signed)
Shift summary:  - Patient OOB to chair for most of the day.  - Continuing with IV pushes of Lasix as ordered.  - PW in place.  - Med/Surg level of care, awaiting bed placement.

## 2019-05-01 LAB — CBC WITH DIFFERENTIAL/PLATELET
Abs Immature Granulocytes: 0.02 10*3/uL (ref 0.00–0.07)
Basophils Absolute: 0 10*3/uL (ref 0.0–0.1)
Basophils Relative: 1 %
Eosinophils Absolute: 0.4 10*3/uL (ref 0.0–0.5)
Eosinophils Relative: 6 %
HCT: 28 % — ABNORMAL LOW (ref 36.0–46.0)
Hemoglobin: 8.2 g/dL — ABNORMAL LOW (ref 12.0–15.0)
Immature Granulocytes: 0 %
Lymphocytes Relative: 23 %
Lymphs Abs: 1.5 10*3/uL (ref 0.7–4.0)
MCH: 25 pg — ABNORMAL LOW (ref 26.0–34.0)
MCHC: 29.3 g/dL — ABNORMAL LOW (ref 30.0–36.0)
MCV: 85.4 fL (ref 80.0–100.0)
Monocytes Absolute: 0.9 10*3/uL (ref 0.1–1.0)
Monocytes Relative: 14 %
Neutro Abs: 3.7 10*3/uL (ref 1.7–7.7)
Neutrophils Relative %: 56 %
Platelets: 233 10*3/uL (ref 150–400)
RBC: 3.28 MIL/uL — ABNORMAL LOW (ref 3.87–5.11)
RDW: 15.7 % — ABNORMAL HIGH (ref 11.5–15.5)
WBC: 6.7 10*3/uL (ref 4.0–10.5)
nRBC: 0 % (ref 0.0–0.2)

## 2019-05-01 LAB — BASIC METABOLIC PANEL
Anion gap: 9 (ref 5–15)
BUN: 28 mg/dL — ABNORMAL HIGH (ref 8–23)
CO2: 32 mmol/L (ref 22–32)
Calcium: 8.7 mg/dL — ABNORMAL LOW (ref 8.9–10.3)
Chloride: 99 mmol/L (ref 98–111)
Creatinine, Ser: 1.16 mg/dL — ABNORMAL HIGH (ref 0.44–1.00)
GFR calc Af Amer: 49 mL/min — ABNORMAL LOW (ref >60)
GFR calc non Af Amer: 42 mL/min — ABNORMAL LOW (ref >60)
Glucose, Bld: 139 mg/dL — ABNORMAL HIGH (ref 70–99)
Potassium: 3.5 mmol/L (ref 3.5–5.1)
Sodium: 140 mmol/L (ref 135–145)

## 2019-05-01 LAB — MAGNESIUM: Magnesium: 2.3 mg/dL (ref 1.7–2.4)

## 2019-05-01 LAB — GLUCOSE, CAPILLARY
Glucose-Capillary: 120 mg/dL — ABNORMAL HIGH (ref 70–99)
Glucose-Capillary: 199 mg/dL — ABNORMAL HIGH (ref 70–99)

## 2019-05-01 LAB — PHOSPHORUS: Phosphorus: 4.2 mg/dL (ref 2.5–4.6)

## 2019-05-01 MED ORDER — TORSEMIDE 20 MG PO TABS: 40.0000 mg | ORAL_TABLET | Freq: Every day | ORAL | 0 refills | Status: DC

## 2019-05-01 NOTE — Progress Notes (Signed)
CRITICAL CARE NOTE  CC  follow up respiratory failure due to CHF exacerbation   SUBJECTIVE Patient SOB improving since admission. Currently breathing comfortably on 2L O2 [place] biPAP placed for presumed OSA/OHS tolerated well    BP 106/77   Pulse 98   Temp 98.1 F (36.7 C) (Oral)   Resp 19   Ht 5\' 2"  (1.575 m)   Wt 89.8 kg   SpO2 100%   BMI 36.21 kg/m    I/O last 3 completed shifts: In: 360 [P.O.:360] Out: 2650 [Urine:2650] Total I/O In: 240 [P.O.:240] Out: 1100 [Urine:1100]  SpO2: 100 % O2 Flow Rate (L/min): 3 L/min FiO2 (%): 28 %   SIGNIFICANT EVENTS 10/21 admitted for severe resp failure diastolic heart failure [id number] remains on biPAP 10/23 weaned from BiPAP. Currently on 2L [place]. Still on heparin drip  10/24- medically optimizing for downgrade  10/25-no acute events overnight  REVIEW OF SYSTEMS Review of Systems  Constitutional: Negative for chills, diaphoresis, fever and malaise/fatigue.  HENT: Negative for congestion, sinus pain and sore throat.   Eyes: Negative for blurred vision, double vision and photophobia.  Respiratory: Negative for cough and shortness of breath (None reported today).   Cardiovascular: Negative for chest pain, palpitations, orthopnea and leg swelling.  Gastrointestinal: Negative for abdominal pain, constipation, diarrhea, nausea and vomiting.  Genitourinary: Negative for dysuria.       Purewick catheter in place   Musculoskeletal: Negative for myalgias.  Neurological: Negative for dizziness, tingling, sensory change and headaches.     PHYSICAL EXAMINATION:  GENERAL: on [place], appears to be in no acute distress  HEAD: Normocephalic, atraumatic.  EYES: Pupils equal, round, reactive to light.  No scleral icterus.  MOUTH: Moist mucosal membrane. NECK: Supple.  PULMONARY: decreased bs b/l +cabg scar CARDIOVASCULAR: S1 and S2. Regular rate and rhythm. No murmurs, rubs, or gallops.  GASTROINTESTINAL: Soft, nontender, -distended. No  masses. Positive bowel sounds. No hepatosplenomegaly.  MUSCULOSKELETAL: No swelling, clubbing, or edema. SCDs bilaterally  NEUROLOGIC: A+Ox4.  SKIN:intact,warm,dry  MEDICATIONS: I have reviewed all medications and confirmed regimen as documented   CULTURE RESULTS   Recent Results (from the past 240 hour(s))  SARS Coronavirus 2 by RT PCR (hospital order, performed in [facility] hospital lab) Nasopharyngeal Nasopharyngeal Swab     Status: None   Collection Time: 04/27/19  1:27 PM   Specimen: Nasopharyngeal Swab  Result Value Ref Range Status   SARS Coronavirus 2 NEGATIVE NEGATIVE Final    Comment: (NOTE) If result is NEGATIVE SARS-CoV-2 target nucleic acids are NOT DETECTED. The SARS-CoV-2 RNA is generally detectable in upper and lower  respiratory specimens during the acute phase of infection. The lowest  concentration of SARS-CoV-2 viral copies this assay can detect is 250  copies / mL. A negative result does not preclude SARS-CoV-2 infection  and should not be used as the sole basis for treatment or other  patient management decisions.  A negative result may occur with  improper specimen collection / handling, submission of specimen other  than nasopharyngeal swab, presence of viral mutation(s) within the  areas targeted by this assay, and inadequate number of viral copies  (<250 copies / mL). A negative result must be combined with clinical  observations, patient history, and epidemiological information. If result is POSITIVE SARS-CoV-2 target nucleic acids are DETECTED. The SARS-CoV-2 RNA is generally detectable in upper and lower  respiratory specimens dur ing the acute phase of infection.  Positive  results are indicative of active infection with SARS-CoV-2.  Clinical  correlation with patient history and other diagnostic information is  necessary to determine patient infection status.  Positive results do  not rule out bacterial infection or co-infection with other  viruses. If result is PRESUMPTIVE POSTIVE SARS-CoV-2 nucleic acids MAY BE PRESENT.   A presumptive positive result was obtained on the submitted specimen  and confirmed on repeat testing.  While 2019 novel coronavirus  (SARS-CoV-2) nucleic acids may be present in the submitted sample  additional confirmatory testing may be necessary for epidemiological  and / or clinical management purposes  to differentiate between  SARS-CoV-2 and other Sarbecovirus currently known to infect humans.  If clinically indicated additional testing with an alternate test  methodology [id number]) is advised. The SARS-CoV-2 RNA is generally  detectable in upper and lower respiratory sp ecimens during the acute  phase of infection. The expected result is Negative. Fact Sheet for Patients:  StrictlyIdeas.no Fact Sheet for Healthcare Providers: BankingDealers.co.za This test is not yet approved or cleared by the [place] FDA and has been authorized for detection and/or diagnosis of SARS-CoV-2 by FDA under an Emergency Use Authorization (EUA).  This EUA will remain in effect (meaning this test can be used) for the duration of the COVID-19 declaration under Section 564(b)(1) of the Act, 21 U.S.C. section 360bbb-3(b)(1), unless the authorization is terminated or revoked sooner. Performed at [facility], [address] [place], [place] [address]   MRSA PCR Screening     Status: None   Collection Time: 04/27/19  5:15 PM   Specimen: Nasal Mucosa; Nasopharyngeal  Result Value Ref Range Status   MRSA by PCR NEGATIVE NEGATIVE Final    Comment:        The GeneXpert MRSA Assay (FDA approved for NASAL specimens only), is one component of a comprehensive MRSA colonization surveillance program. It is not intended to diagnose MRSA infection nor to guide or monitor treatment for MRSA infections. Performed at [facility], [address]  [place], [place] [address]    CBC    Component Value Date/Time   WBC 6.7 05/01/2019 0415   RBC 3.28 (L) 05/01/2019 0415   HGB 8.2 (L) 05/01/2019 0415   HGB 10.5 (L) 09/28/2017 0928   HCT 28.0 (L) 05/01/2019 0415   HCT 33.2 (L) 09/28/2017 0928   PLT 233 05/01/2019 0415   PLT 287 09/28/2017 0928   MCV 85.4 05/01/2019 0415   MCV 87 09/28/2017 0928   MCV 88 09/10/2013 0421   MCH 25.0 (L) 05/01/2019 0415   MCHC 29.3 (L) 05/01/2019 0415   RDW 15.7 (H) 05/01/2019 0415   RDW 14.1 09/28/2017 0928   RDW 15.7 (H) 09/10/2013 0421   LYMPHSABS 1.5 05/01/2019 0415   LYMPHSABS 2.0 09/28/2017 0928   LYMPHSABS 1.2 09/10/2013 0421   MONOABS 0.9 05/01/2019 0415   MONOABS 0.7 09/10/2013 0421   EOSABS 0.4 05/01/2019 0415   EOSABS 0.2 09/28/2017 0928   EOSABS 0.2 09/10/2013 0421   BASOSABS 0.0 05/01/2019 0415   BASOSABS 0.0 09/28/2017 0928   BASOSABS 0.0 09/10/2013 0421   BMP Latest Ref Rng & Units 05/01/2019 04/30/2019 04/29/2019  Glucose 70 - 99 mg/dL 139(H) 145(H) 140(H)  BUN 8 - 23 mg/dL 28(H) 26(H) 24(H)  Creatinine 0.44 - 1.00 mg/dL 1.16(H) 1.28(H) 1.03(H)  BUN/Creat Ratio 12 - 28 - - -  Sodium 135 - 145 mmol/L 140 141 143  Potassium 3.5 - 5.1 mmol/L 3.5 4.3 3.9  Chloride 98 - 111 mmol/L 99 101 102  CO2  22 - 32 mmol/L 32 33(H) 30  Calcium 8.9 - 10.3 mg/dL 8.7(L) 9.0 9.1      Indwelling Urinary Catheter continued, requirement due to   Reason to continue Indwelling Urinary Catheter strict Intake/Output monitoring for hemodynamic instability      ASSESSMENT AND PLAN SYNOPSIS   Severe ACUTE Hypoxic Respiratory Failure due to CHF exacerbation  slowly reoslving -continue on 2L [place] -Monitor SpO2, RR and patient's symptoms   Acute diastolic cardiac failure - HFpEF -oxygen as needed -Lasix as tolerated  -follow up cardiac enzymes as indicated   ACUTE KIDNEY INJURY/Renal Failure -follow chem 7 -follow UO -Avoid nephrotoxic agents -Continue on purewick catheter   CARDIAC -ICU  monitoring -Discontinue heparin drip  -Consult cardiology   GI GI PROPHYLAXIS as indicated  NUTRITIONAL STATUS DIET--> Reduced salt intake/cardiac diet as tolerated  Constipation protocol as indicated  ENDO - will use ICU hypoglycemic\Hyperglycemia protocol if indicated   ELECTROLYTES -follow labs as needed -replace as needed -pharmacy consultation and following   DVT/GI PRX ordered TRANSFUSIONS AS NEEDED MONITOR FSBS ASSESS the need for LABS as needed      Ottie Glazier, M.D.  [facility] [place]

## 2019-05-01 NOTE — TOC Transition Note (Signed)
Transition of Care [facility]) - CM/SW Discharge Note   Patient Details  Name: Kimberly Peterson MRN: [id number] Date of Birth: 1929/10/04  [facility]) CM/SW Contact:  Latanya Maudlin, RN Phone Number: 05/01/2019, 1:55 PM   Clinical Narrative:   Patient to be discharged per MD order. Orders in place for home health services. Patient active with [facility]. Orders in place to resume. Notified [name] of discharge.           Patient Goals and CMS Choice        Discharge Placement                       Discharge Plan and Services                                     Social Determinants of Health (SDOH) Interventions     Readmission Risk Interventions Readmission Risk Prevention Plan 03/05/2019 03/03/2019  Transportation Screening Complete Complete  PCP or Specialist Appt within 3-5 Days Complete -  HRI or [place] Complete Complete  Social Work Consult for [place] Planning/Counseling Complete -  Palliative Care Screening Not Applicable Complete  Medication Review Press photographer) Complete Complete  Some recent data might be hidden

## 2019-05-01 NOTE — Progress Notes (Signed)
Patient is alert and oriented x 4. Patient is at baseline 3 liters O2. Cleared for discharged per MD pending home health PT set up. Josh with care management confirmed home health has been set up and will be in touch with patient in next 24 hours. Reviewed discharge instructions with patient and daughter (who lives with patient). Will discharge per orders.

## 2019-05-01 NOTE — Evaluation (Signed)
Physical Therapy Evaluation Patient Details Name: Kimberly Peterson MRN: [id number] DOB: [birth year] Today's Date: 05/01/2019   History of Present Illness  Pt is an 85 year old female admitted with acute on chronic diastolic heart failure, COPD exacerbation and Htn.  PMH includes COPD, CHF, DM and CKD.  Clinical Impression  Pt is an 85 year old female who lives in a one story home with her daughter.  Pt requires min assistance for ADL's and walks with a RW at home.  Pt sitting up in chair and on 3L of O2 when PT arrived.  She was very pleasant and willing to work with therapy.  Pt presented with fair overall strength and reported no sensation loss.  Pt able to stand from chair with use of UE's and stand without UE support for 10 sec.  Pt unable to perform activities that require reaching outside BOS due to balance concerns.  She also maintains a flexed posture during static and dynamic gait activity.  Pt Able to ambulate 40 ft in room with 3 standing rest breaks and use of RW.  Vitals remained WNL.  Pt will continue to benefit from skilled PT with focus on balance, tolerance to activity, safe functional mobility and strength.    Follow Up Recommendations Home health PT;Supervision - Intermittent    Equipment Recommendations  None recommended by PT    Recommendations for Other Services       Precautions / Restrictions Precautions Precautions: Fall Restrictions Weight Bearing Restrictions: No      Mobility  Bed Mobility Overal bed mobility: (In chair upon arrival and departure.)                Transfers Overall transfer level: Needs assistance Equipment used: Rolling walker (2 wheeled) Transfers: Sit to/from Stand Sit to Stand: Supervision         General transfer comment: Able to stand with min use of UE's, did pull on RW.  Ambulation/Gait Ambulation/Gait assistance: Min guard Gait Distance (Feet): 40 Feet Assistive device: Rolling walker (2 wheeled)     Gait  velocity interpretation: 1.31 - 2.62 ft/sec, indicative of limited community ambulator General Gait Details: Short shuffling steps, flexed posture, good use of RW.  Stairs            Wheelchair Mobility    Modified Rankin (Stroke Patients Only)       Balance Overall balance assessment: Needs assistance   Sitting balance-Leahy Scale: Normal     Standing balance support: Bilateral upper extremity supported Standing balance-Leahy Scale: Fair Standing balance comment: Functional reach: 1".  Pt stated that she does not reach overhead to cabinets and that her daughter assists her with all types of activity involving picking up objects from floor and reaching outside BOS.                             Pertinent Vitals/Pain Pain Assessment: No/denies pain    Home Living Family/patient expects to be discharged to:: Private residence Living Arrangements: Children(Daughter has had recent surgery and has "other health issues" according to pt.) Available Help at Discharge: Family;Available 24 hours/day Type of Home: House Home Access: Level entry     Home Layout: One level Home Equipment: Walker - 4 wheels;Shower seat - built in;Cane - single point      Prior Function Level of Independence: Needs assistance   Gait / Transfers Assistance Needed: Ambulates with rollator  ADL's / Homemaking Assistance Needed: Daughter  assists with meals and bathing intermittently.  Comments: 3L O2     Hand Dominance        Extremity/Trunk Assessment   Upper Extremity Assessment Upper Extremity Assessment: Overall WFL for tasks assessed    Lower Extremity Assessment Lower Extremity Assessment: Overall WFL for tasks assessed(Grossly 4-/5 bilaterally.  No sensation loss.)    Cervical / Trunk Assessment Cervical / Trunk Assessment: Kyphotic  Communication   Communication: No difficulties  Cognition Arousal/Alertness: Awake/alert Behavior During Therapy: WFL for tasks  assessed/performed Overall Cognitive Status: Within Functional Limits for tasks assessed                                 General Comments: Very pleasant and ready to work with therapy.      General Comments      Exercises Other Exercises Other Exercises: Time to monitor vitals. x3 min Other Exercises: Time to educate regarding benefit of [place] PT and assistance with meal tray setup.  x5 min   Assessment/Plan    PT Assessment Patient needs continued PT services  PT Problem List Decreased strength;Decreased mobility;Decreased balance;Decreased activity tolerance       PT Treatment Interventions Gait training;Therapeutic exercise;Patient/family education;Stair training;Balance training;Functional mobility training;Therapeutic activities    PT Goals (Current goals can be found in the Care Plan section)  Acute Rehab PT Goals Patient Stated Goal: To return home and back to general daily activity. PT Goal Formulation: With patient Time For Goal Achievement: 05/15/19 Potential to Achieve Goals: Good    Frequency Min 2X/week   Barriers to discharge        Co-evaluation               AM-PAC PT "6 Clicks" Mobility  Outcome Measure Help needed turning from your back to your side while in a flat bed without using bedrails?: None Help needed moving from lying on your back to sitting on the side of a flat bed without using bedrails?: None Help needed moving to and from a bed to a chair (including a wheelchair)?: A Little Help needed standing up from a chair using your arms (e.g., wheelchair or bedside chair)?: A Little Help needed to walk in hospital room?: A Little Help needed climbing 3-5 steps with a railing? : A Little 6 Click Score: 20    End of Session Equipment Utilized During Treatment: Gait belt;Oxygen Activity Tolerance: Patient tolerated treatment well Patient left: in chair;with call bell/phone within reach;with nursing/sitter in room Nurse  Communication: Mobility status PT Visit Diagnosis: Unsteadiness on feet (R26.81);Muscle weakness (generalized) (M62.81)    Time: [id number] PT Time Calculation (min) (ACUTE ONLY): 33 min   Charges:   PT Evaluation $PT Eval Low Complexity: 1 Low PT Treatments $Therapeutic Activity: 8-22 mins       Roxanne Gates, PT, DPT  Roxanne Gates 05/01/2019, 1:09 PM

## 2019-05-01 NOTE — Discharge Instructions (Signed)
°-   Daily fluids < 2 liters. - Low salt diet - Check weight everyday and keep log. Take to your doctors appt. - Take extra dose of Torsemide if you gain more than 3 pounds weight.

## 2019-05-02 ENCOUNTER — Other Ambulatory Visit: Payer: Self-pay

## 2019-05-02 ENCOUNTER — Ambulatory Visit (INDEPENDENT_AMBULATORY_CARE_PROVIDER_SITE_OTHER): Payer: Medicare Other

## 2019-05-02 DIAGNOSIS — Z7901 Long term (current) use of anticoagulants: Secondary | ICD-10-CM

## 2019-05-02 NOTE — Progress Notes (Signed)
Pt INR 1.4 as per dr Humphrey Rolls pt daughter advised take coumadin 7.5 mg today and tomorrow and then continue coumadin 5 mg and re check monday

## 2019-05-03 ENCOUNTER — Other Ambulatory Visit: Payer: Self-pay | Admitting: *Deleted

## 2019-05-03 DIAGNOSIS — I5033 Acute on chronic diastolic (congestive) heart failure: Secondary | ICD-10-CM | POA: Diagnosis not present

## 2019-05-03 DIAGNOSIS — J449 Chronic obstructive pulmonary disease, unspecified: Secondary | ICD-10-CM | POA: Diagnosis not present

## 2019-05-03 DIAGNOSIS — N182 Chronic kidney disease, stage 2 (mild): Secondary | ICD-10-CM | POA: Diagnosis not present

## 2019-05-03 DIAGNOSIS — J9621 Acute and chronic respiratory failure with hypoxia: Secondary | ICD-10-CM | POA: Diagnosis not present

## 2019-05-03 DIAGNOSIS — E1122 Type 2 diabetes mellitus with diabetic chronic kidney disease: Secondary | ICD-10-CM | POA: Diagnosis not present

## 2019-05-03 DIAGNOSIS — I130 Hypertensive heart and chronic kidney disease with heart failure and stage 1 through stage 4 chronic kidney disease, or unspecified chronic kidney disease: Secondary | ICD-10-CM | POA: Diagnosis not present

## 2019-05-03 NOTE — Patient Outreach (Signed)
[facility]) Care Management  05/03/2019  Kimberly Peterson January 12, 1930 [id number]   Noted that member was discharged over the weekend after being admitted with heart failure complications.  Primary MD office will complete transition of care assessment.  Call placed to member to follow up on discharge, state she is feeling "much better."  Report this episode was different from previous ones as she and her daughter didn't notice any weight gain.  They arrived home from the dentist office and she was unable to make it into the home due to shortness of breath.  After discharge, Torsemide was increased to 40 mg daily instead of 20 mg.  She has appointment with heart failure clinic on 11/4 and with PCP on 11/9.  Appointment with palliative care nurse also on 11/9.  Denies being contacted by paramedicine team, no visits scheduled, this care manager will follow up with HF clinic.  State her daughter reached out to PCP office regarding elevated blood sugars last week but they have not received any advise back.  Report readings remain elevated up into the 200s.  State her insulin was decreased from 26 units to 21 and she is wondering if she should go back up.  She will have her daughter call the office again for advice.  Denies any urgent concerns at this time, will follow up within the next 2 weeks.    [facility] CM Care Plan Problem One     Most Recent Value  Care Plan Problem One  Risk for readmission related to heart failure management as evidenced by recent hospitalization  Role Documenting the Problem One  Care Management [place] for Problem One  Active  [facility] Long Term Goal   Member will not be readmitted to hospital within the next 31 days  [facility] Long Term Goal Start Date  05/03/19  Interventions for Problem One Long Term Goal  Discharge instructions reviewed with member, advised of importance of following recommended plan of care.  Confirmed home health nurse has restarted and  appointments for HF clinic and PCP reviewed.  [facility] CM Short Term Goal #1   Member will report compliance with paramedicine visits over the next 4 weeks  [facility] CM Short Term Goal #1 Start Date  04/21/19  Interventions for Short Term Goal #1  Contacted NP at HF clinic regarding referral to paramedicine.    [facility]N CM Short Term Goal #2   Member will report keeping and completing appointment with palliative care NP within the next 2 weeks  [facility] CM Short Term Goal #2 Start Date  04/21/19  Interventions for Short Term Goal #2  Reviewed upcoming appointments with member.     Valente David, [place], MSN [place] [phone]

## 2019-05-04 ENCOUNTER — Telehealth: Payer: Self-pay

## 2019-05-04 NOTE — Telephone Encounter (Signed)
Spoke with pt her glucose was still high in morning I offered pt appt to come in office she only want telephone visit she was just came out from hospital we make tomorrow hospital follow up with [facility]

## 2019-05-05 ENCOUNTER — Other Ambulatory Visit: Payer: Self-pay

## 2019-05-05 ENCOUNTER — Telehealth (HOSPITAL_COMMUNITY): Payer: Self-pay | Admitting: Licensed Clinical Social Worker

## 2019-05-05 ENCOUNTER — Ambulatory Visit (INDEPENDENT_AMBULATORY_CARE_PROVIDER_SITE_OTHER): Payer: Medicare Other | Admitting: Internal Medicine

## 2019-05-05 ENCOUNTER — Encounter: Payer: Self-pay | Admitting: Internal Medicine

## 2019-05-05 DIAGNOSIS — E1129 Type 2 diabetes mellitus with other diabetic kidney complication: Secondary | ICD-10-CM

## 2019-05-05 DIAGNOSIS — I4891 Unspecified atrial fibrillation: Secondary | ICD-10-CM

## 2019-05-05 DIAGNOSIS — G4733 Obstructive sleep apnea (adult) (pediatric): Secondary | ICD-10-CM

## 2019-05-05 DIAGNOSIS — Z7901 Long term (current) use of anticoagulants: Secondary | ICD-10-CM

## 2019-05-05 DIAGNOSIS — Z9989 Dependence on other enabling machines and devices: Secondary | ICD-10-CM | POA: Diagnosis not present

## 2019-05-05 DIAGNOSIS — I5033 Acute on chronic diastolic (congestive) heart failure: Secondary | ICD-10-CM | POA: Diagnosis not present

## 2019-05-05 DIAGNOSIS — E1165 Type 2 diabetes mellitus with hyperglycemia: Secondary | ICD-10-CM

## 2019-05-05 DIAGNOSIS — IMO0002 Reserved for concepts with insufficient information to code with codable children: Secondary | ICD-10-CM

## 2019-05-05 NOTE — Telephone Encounter (Signed)
Pt identified for paramedicine program- confirmed with pt that she is agreeable.  Referral completed and sent to paramedics for review and follow up.  Paramedicine Initial Assessment:  Housing:  In what kind of housing do you live? House/apt/trailer/shelter? house  Do you rent/pay a mortgage/own? own  Do you live with anyone? daughter  Are you currently worried about losing your housing? no  Within the past 12 months have you ever stayed outside, in a car, tent, a shelter, or temporarily with someone?no  Within the past 12 months have you been unable to get utilities when it was really needed? no  Social:  What is your current marital status? single  Do you have any children? Yes but they live in [place] you have family or friends who live locally?  Food:  Within the past 12 months were you ever worried that food would run out before you got money to buy more? no  Within the past 50months have you run out of food and didn't have money to buy more? no  Income:  What is your current source of income? retirement  How hard is it for you to pay for the basics like food housing, medical care, and utilities? Not very hard  Do you have outstanding medical bills? no  Insurance:  Are you currently insured? yes  Do you have prescription coverage? yes  If no insurance, have you applied for coverage (Medicaid, disability, marketplace etc)? n/a  Transportation:  Do you have transportation to your medical appointments? yes   If yes, how? Daughter drives her  In the past 12 months has lack of transportation kept you from medical appts or from getting medications? no  In the past 12 months has lack of transportation kept you from meetings, work, or getting things you needed? no   Daily Health Needs: Do you have a working scale at home? yes  How do you manage your medications at home? Take them out of the bottle  Do you ever take your medications differently than  prescribed? no  Do you have issues affording your medications? no  If yes, has this ever prevented you from obtaining medications?  Do you have any concerns with mobility at home?   Do you use any assistive devices at home or have PCS at home? Walker- interested in home aid but makes too much to qualify for Medicaid so unable to get through insurance- will look into getting patient   Are there any additional barriers you see to getting the care you need? no  CSW will continue to follow through paramedicine program and assist as needed.  Jorge Ny, LCSW Clinical Social Worker [facility] Desk#: [phone] Cell#: [phone]

## 2019-05-05 NOTE — Progress Notes (Signed)
[facility] [place], [place] [address]  Internal MEDICINE  Office Visit Note  Patient Name: Kimberly Peterson  [id number]  [id number]  Date of Service: 05/05/2019  Transitional care Management!!!  Chief Complaint  Patient presents with  . Hospitalization Follow-up  . Diabetes  . Nausea  . Medical Management of Chronic Issues    glucose 05/02/19 140 morning,263 evening,10/27 163 morning,227 evening,05/04/19 191 morning and 234 eveninig and today 141   She is connected via doximity to prevent risk of spread of Covid-19. Daughter is present with her   HPI Pt is here for recent hospital follow up.She is hospitalized for acute on chronic diastolic heart failure. Pt was in the hospital for 4 days, diuretics were increased. She is non adherent with her Bipap/Cpap. Her diuretic was increased, feels better, blood sugar numbers are fluctuating according to her diet Getting OT/PT. She is also on chronic coumadin due to aortic valve replacement and atrial fib.   Current Medication: Outpatient Encounter Medications as of 05/05/2019  Medication Sig  . aspirin 81 MG tablet Take 81 mg by mouth daily.  Marland Kitchen atorvastatin (LIPITOR) 10 MG tablet TAKE 1 TABLET BY MOUTH ONCE DAILY FOR CHOLESTEROL (Patient taking differently: Take 10 mg by mouth daily. )  . CARTIA XT 240 MG 24 hr capsule Take 1 capsule by mouth once daily (Patient taking differently: Take 240 mg by mouth daily. )  . hydrALAZINE (APRESOLINE) 25 MG tablet TAKE 1 & 1/2 (ONE & ONE-HALF) TABLETS BY MOUTH THREE TIMES DAILY FOR BLOOD PRESSURE (Patient taking differently: Take 37.5 mg by mouth 3 (three) times daily. )  . insulin aspart protamine- aspart (NOVOLOG MIX 70/30) (70-30) 100 UNIT/ML injection Inject 21-34 Units into the skin See admin instructions. Inject 34u under the skin every morning and inject 21u under the skin every evening  . pantoprazole (PROTONIX) 40 MG tablet Take one tab po qd for stomach (Patient taking  differently: Take 40 mg by mouth daily. )  . potassium chloride SA (KLOR-CON) 20 MEQ tablet Take 20 mEq by mouth daily.  Marland Kitchen torsemide ([name]) 20 MG tablet Take 2 tablets (40 mg total) by mouth daily.  Marland Kitchen warfarin (COUMADIN) 5 MG tablet TAKE ONE TABLET BY MOUTH ONCE DAILY AT 5-6PM AND TAKE ONE AND ONE-HALF TABLET BY MOUTH ON THURSDAY (Patient taking differently: Take 5-7.5 mg by mouth See admin instructions. Take 1 tablet (5mg ) by mouth every Monday, Tuesday. Wednesday, Friday, Saturday and Sunday evening and take 1 tablets (7.5mg ) by mouth every Thursday evening)  . carvedilol (COREG) 6.25 MG tablet Take 1 tablet (6.25 mg total) by mouth 2 (two) times daily with a meal.   No facility-administered encounter medications on file as of 05/05/2019.     Surgical History: Past Surgical History:  Procedure Laterality Date  . aoric valve replacemet      st Jude  . CATARACT EXTRACTION, BILATERAL    . PACEMAKER PLACEMENT    . VALVE REPLACEMENT      Medical History: Past Medical History:  Diagnosis Date  . CHF (congestive heart failure) ([place])   . Chronic kidney disease   . COPD (chronic obstructive pulmonary disease) ([place])   . Diabetes mellitus without complication ([place])   . Hypertension     Family History: History reviewed. No pertinent family history.  Social History   Socioeconomic History  . Marital status: Married    Spouse name: Not on file  . Number of children: Not on file  . Years  of education: Not on file  . Highest education level: Not on file  Occupational History  . Not on file  Social Needs  . Financial resource strain: Not hard at all  . Food insecurity    Worry: Never true    Inability: Never true  . Transportation needs    Medical: No    Non-medical: No  Tobacco Use  . Smoking status: Never Smoker  . Smokeless tobacco: Never Used  Substance and Sexual Activity  . Alcohol use: No  . Drug use: No  . Sexual activity: Never  Lifestyle  . Physical activity     Days per week: Not on file    Minutes per session: Not on file  . Stress: Not on file  Relationships  . Social Herbalist on phone: Not on file    Gets together: Not on file    Attends religious service: Not on file    Active member of club or organization: Not on file    Attends meetings of clubs or organizations: Not on file    Relationship status: Not on file  . Intimate partner violence    Fear of current or ex partner: Not on file    Emotionally abused: Not on file    Physically abused: Not on file    Forced sexual activity: Not on file  Other Topics Concern  . Not on file  Social History Narrative  . Not on file   Review of Systems  Constitutional: Negative for chills, diaphoresis and fatigue.  HENT: Negative for ear pain, postnasal drip and sinus pressure.   Eyes: Negative for photophobia, discharge, redness, itching and visual disturbance.  Respiratory: Negative for cough, shortness of breath and wheezing.   Cardiovascular: Negative for chest pain, palpitations and leg swelling.  Gastrointestinal: Negative for abdominal pain, constipation, diarrhea, nausea and vomiting.  Genitourinary: Negative for dysuria and flank pain.  Musculoskeletal: Negative for arthralgias, back pain, gait problem and neck pain.  Skin: Negative for color change.  Allergic/Immunologic: Negative for environmental allergies and food allergies.  Neurological: Negative for dizziness and headaches.  Hematological: Does not bruise/bleed easily.  Psychiatric/Behavioral: Negative for agitation, behavioral problems (depression), decreased concentration and hallucinations. The patient is not hyperactive.     Vital Signs: BP (!) 155/65   Pulse 72   Temp (!) 97.5 F (36.4 C)   Resp 16   Ht 5\' 2"  (1.575 m)   Wt 182 lb 12.8 oz (82.9 kg)   SpO2 97% Comment: 2litre  BMI 33.43 kg/m    Physical Exam  Pt seems in good spirits, no acute distress. No other exam is performed   Assessment/Plan: 1. Diastolic CHF, acute on chronic (HCC) - Stable at this time, will monitor wt and O2 sats   2. Atrial fibrillation, unspecified type ([place]) - Controlled   3. Long-term (current) use of anticoagulants, INR goal 2.0-3.0 - Continue Coumadin as before   4. Uncontrolled type 2 diabetes mellitus with hyperglycemia (HCC) - Continue mix 70/30 for now, will add SS if needed   5. OSA on CPAP - Encouraged compliance  6. Type 2 diabetes, uncontrolled, with renal manifestation (HCC) - Monitor, she does have worsening renal functions due to diuretic dose increase  General Counseling: Florella verbalizes understanding of the findings of todays visit and agrees with plan of treatment. I have discussed any further diagnostic evaluation that may be needed or ordered today. We also reviewed her medications today. she has been encouraged to  call the office with any questions or concerns that should arise related to todays visit.  I have reviewed all medical records from hospital follow up including radiology reports and consults from other physicians. Appropriate follow up diagnostics will be scheduled as needed. Patient/ Family understands the plan of treatment. Time spent30 minutes.   Dr [name], MD Internal Medicine

## 2019-05-06 ENCOUNTER — Telehealth (HOSPITAL_COMMUNITY): Payer: Self-pay | Admitting: Licensed Clinical Social Worker

## 2019-05-06 NOTE — Telephone Encounter (Signed)
CSW called and made referral to the [facility] [place] which can provide up to 8 hours of assistance to those who don't qualify to have insurance cover an in home aid.  They state wait list is up to a year but that it would depend on patients assessed level of need/age/ health.  CSW will continue to follow and assist as needed  Jorge Ny, [facility] Desk#: [phone] Cell#: [phone]

## 2019-05-07 NOTE — Discharge Summary (Signed)
[name] [place] NAME: Kimberly Peterson    MR#:  [id number]  DATE OF BIRTH:  1930-04-25  DATE OF ADMISSION:  04/27/2019 ADMITTING PHYSICIAN: Harrie Foreman, MD  DATE OF DISCHARGE: 05/01/2019  3:06 PM  PRIMARY CARE PHYSICIAN: [name], MD   ADMISSION DIAGNOSIS:  Acute pulmonary edema ([place]) [J81.0] Acute respiratory failure with hypoxia ([place]) [J96.01]  DISCHARGE DIAGNOSIS:  Active Problems:   Acute on chronic diastolic heart failure ([place])   SECONDARY DIAGNOSIS:   Past Medical History:  Diagnosis Date  . CHF (congestive heart failure) ([place])   . Chronic kidney disease   . COPD (chronic obstructive pulmonary disease) ([place])   . Diabetes mellitus without complication ([place])   . Hypertension      ADMITTING HISTORY  Chief Complaint: Shortness of breath HPI: The patient with past medical history of CHF, chronic kidney disease as well as diabetes, hypertension and COPD presents to the emergency department from home complaining of shortness of breath.  At the time she could talk she reported feeling fine before going to bed but awoke this morning with significant dyspnea.  Upon arrival to the emergency department she was found to be hypertensive and hypoxic requiring 5 L of oxygen by nasal cannula.  She was placed on nonrebreather before requiring BiPAP due to increased work of breathing.  Chest x-ray showed pulmonary edema.  Patient was given Lasix IV.  She is also found to have a dramatic rise in her troponin from 73-1 23.  She was started on a heparin drip prior to the emergency department staff calling the hospitalist service for admission.  HOSPITAL COURSE:   This is an 85 year old female admitted for CHF exacerbation.  1. Acute on chronic diastolic congestive heart failure with acute on chronic hypoxic resp failure POA Diuresed well with IV Lasix Increased home Lasix and discharged home.  Home health set up.  2. COPD: Nebs  and inhalers 3. Hypertension: Improving  4. Diabetes mellitus type 2 SSI, Diabetic diet  Stable for discharge home  CONSULTS OBTAINED:  Treatment Team:  [facility], [facility], MD  DRUG ALLERGIES:  No Known Allergies  DISCHARGE MEDICATIONS:   Allergies as of 05/01/2019   No Known Allergies     Medication List    TAKE these medications   aspirin 81 MG tablet Take 81 mg by mouth daily.   atorvastatin 10 MG tablet Commonly known as: LIPITOR TAKE 1 TABLET BY MOUTH ONCE DAILY FOR CHOLESTEROL What changed:   how much to take  how to take this  when to take this  additional instructions   Cartia XT 240 MG 24 hr capsule Generic drug: diltiazem Take 1 capsule by mouth once daily What changed: how much to take   carvedilol 6.25 MG tablet Commonly known as: COREG Take 1 tablet (6.25 mg total) by mouth 2 (two) times daily with a meal.   hydrALAZINE 25 MG tablet Commonly known as: APRESOLINE TAKE 1 & 1/2 (ONE & ONE-HALF) TABLETS BY MOUTH THREE TIMES DAILY FOR BLOOD PRESSURE What changed:   how much to take  how to take this  when to take this  additional instructions   insulin aspart protamine- aspart (70-30) 100 UNIT/ML injection Commonly known as: NOVOLOG MIX 70/30 Inject 21-34 Units into the skin See admin instructions. Inject 34u under the skin every morning and inject 21u under the skin every evening   pantoprazole 40 MG tablet Commonly known as: PROTONIX Take one tab  po qd for stomach What changed:   how much to take  how to take this  when to take this  additional instructions   potassium chloride SA 20 MEQ tablet Commonly known as: KLOR-CON Take 20 mEq by mouth daily.   torsemide 20 MG tablet Commonly known as: [name] Take 2 tablets (40 mg total) by mouth daily. What changed:   how much to take  how to take this  when to take this  additional instructions   warfarin 5 MG tablet Commonly known as: COUMADIN TAKE ONE TABLET BY  MOUTH ONCE DAILY AT 5-6PM AND TAKE ONE AND ONE-HALF TABLET BY MOUTH ON THURSDAY What changed:   how much to take  how to take this  when to take this  additional instructions       Today   VITAL SIGNS:  Blood pressure 106/77, pulse 98, temperature 98.1 F (36.7 C), temperature source Oral, resp. rate 19, height 5\' 2"  (1.575 m), weight 89.8 kg, SpO2 100 %.  I/O:  No intake or output data in the 24 hours ending 05/07/19 1238  PHYSICAL EXAMINATION:  Physical Exam  GENERAL:  85 y.o.-year-old patient lying in the bed with no acute distress.  LUNGS: Normal breath sounds bilaterally, no wheezing, rales,rhonchi or crepitation. No use of accessory muscles of respiration.  CARDIOVASCULAR: S1, S2 normal. No murmurs, rubs, or gallops.  ABDOMEN: Soft, non-tender, non-distended. Bowel sounds present. No organomegaly or mass.  NEUROLOGIC: Moves all 4 extremities. PSYCHIATRIC: The patient is alert and oriented x 3.  SKIN: No obvious rash, lesion, or ulcer.   DATA REVIEW:   CBC Recent Labs  Lab 05/01/19 0415  WBC 6.7  HGB 8.2*  HCT 28.0*  PLT 233    Chemistries  Recent Labs  Lab 05/01/19 0415  NA 140  K 3.5  CL 99  CO2 32  GLUCOSE 139*  BUN 28*  CREATININE 1.16*  CALCIUM 8.7*  MG 2.3    Cardiac Enzymes No results for input(s): TROPONINI in the last 168 hours.  Microbiology Results  Results for orders placed or performed during the hospital encounter of 04/27/19  SARS Coronavirus 2 by RT PCR (hospital order, performed in [facility] hospital lab) Nasopharyngeal Nasopharyngeal Swab     Status: None   Collection Time: 04/27/19  1:27 PM   Specimen: Nasopharyngeal Swab  Result Value Ref Range Status   SARS Coronavirus 2 NEGATIVE NEGATIVE Final    Comment: (NOTE) If result is NEGATIVE SARS-CoV-2 target nucleic acids are NOT DETECTED. The SARS-CoV-2 RNA is generally detectable in upper and lower  respiratory specimens during the acute phase of infection. The lowest   concentration of SARS-CoV-2 viral copies this assay can detect is 250  copies / mL. A negative result does not preclude SARS-CoV-2 infection  and should not be used as the sole basis for treatment or other  patient management decisions.  A negative result may occur with  improper specimen collection / handling, submission of specimen other  than nasopharyngeal swab, presence of viral mutation(s) within the  areas targeted by this assay, and inadequate number of viral copies  (<250 copies / mL). A negative result must be combined with clinical  observations, patient history, and epidemiological information. If result is POSITIVE SARS-CoV-2 target nucleic acids are DETECTED. The SARS-CoV-2 RNA is generally detectable in upper and lower  respiratory specimens dur ing the acute phase of infection.  Positive  results are indicative of active infection with SARS-CoV-2.  Clinical  correlation  with patient history and other diagnostic information is  necessary to determine patient infection status.  Positive results do  not rule out bacterial infection or co-infection with other viruses. If result is PRESUMPTIVE POSTIVE SARS-CoV-2 nucleic acids MAY BE PRESENT.   A presumptive positive result was obtained on the submitted specimen  and confirmed on repeat testing.  While 2019 novel coronavirus  (SARS-CoV-2) nucleic acids may be present in the submitted sample  additional confirmatory testing may be necessary for epidemiological  and / or clinical management purposes  to differentiate between  SARS-CoV-2 and other Sarbecovirus currently known to infect humans.  If clinically indicated additional testing with an alternate test  methodology [phone]) is advised. The SARS-CoV-2 RNA is generally  detectable in upper and lower respiratory sp ecimens during the acute  phase of infection. The expected result is Negative. Fact Sheet for Patients:  StrictlyIdeas.no Fact Sheet  for Healthcare Providers: BankingDealers.co.za This test is not yet approved or cleared by the [place] FDA and has been authorized for detection and/or diagnosis of SARS-CoV-2 by FDA under an Emergency Use Authorization (EUA).  This EUA will remain in effect (meaning this test can be used) for the duration of the COVID-19 declaration under Section 564(b)(1) of the Act, 21 U.S.C. section 360bbb-3(b)(1), unless the authorization is terminated or revoked sooner. Performed at [facility], [address] [place], [place] [address]   MRSA PCR Screening     Status: None   Collection Time: 04/27/19  5:15 PM   Specimen: Nasal Mucosa; Nasopharyngeal  Result Value Ref Range Status   MRSA by PCR NEGATIVE NEGATIVE Final    Comment:        The GeneXpert MRSA Assay (FDA approved for NASAL specimens only), is one component of a comprehensive MRSA colonization surveillance program. It is not intended to diagnose MRSA infection nor to guide or monitor treatment for MRSA infections. Performed at [facility], [address] [place],  [address]     RADIOLOGY:  No results found.  Follow up with PCP in 1 week.  Management plans discussed with the patient, family and they are in agreement.  CODE STATUS:  Code Status History    Date Active Date Inactive Code Status Order ID Comments User Context   04/27/2019 1715 05/01/2019 1811 Full Code [id number]  Harrie Foreman, MD Inpatient   03/01/2019 1516 03/05/2019 1741 Full Code [id number]  Demetrios Loll, MD Inpatient   02/07/2019 1126 02/09/2019 1939 Full Code [id number]  Lang Snow, NP ED   08/02/2018 1745 08/05/2018 1704 Full Code [id number]  Fritzi Mandes, MD Inpatient   12/04/2015 0346 12/06/2015 1852 Full Code [id number]  Quintella Baton, MD Inpatient   09/19/2015 1427 09/21/2015 2113 Full Code [id number]  Robbie Lis, MD Inpatient   09/23/2013 1544 09/29/2013 1741 Full Code [id number]  Rush Farmer, MD ED   Advance Care Planning Activity      TOTAL TIME TAKING CARE OF THIS PATIENT ON DAY OF DISCHARGE: more than 30 minutes.   [name] Juliah Scadden M.D on 05/07/2019 at 12:38 PM  Between 7am to 6pm - Pager - [phone]  After 6pm go to [email or web address] - password [email or web address] [facility]  Office  [phone]  CC: Primary care physician; [name], MD  Note: This dictation was prepared with Dragon dictation along with smaller phrase technology. Any transcriptional errors that result from this process are unintentional.

## 2019-05-09 ENCOUNTER — Telehealth: Payer: Self-pay | Admitting: Family

## 2019-05-09 ENCOUNTER — Ambulatory Visit (INDEPENDENT_AMBULATORY_CARE_PROVIDER_SITE_OTHER): Payer: Medicare Other

## 2019-05-09 ENCOUNTER — Other Ambulatory Visit: Payer: Self-pay

## 2019-05-09 ENCOUNTER — Telehealth: Payer: Self-pay | Admitting: Internal Medicine

## 2019-05-09 DIAGNOSIS — Z7901 Long term (current) use of anticoagulants: Secondary | ICD-10-CM | POA: Diagnosis not present

## 2019-05-09 NOTE — Telephone Encounter (Signed)
Pt is having back pain ,directly mid back behind where the breast would be , pt is having a pulling / fluttering sensation , no urinary issues , denies any pain or burning , but daughter states she is not eating , she has lost her appetite, been using tylenol and warm compresses on her back.

## 2019-05-09 NOTE — Telephone Encounter (Signed)
Patient does prefer to come here for ultrasound, pt is aware of not eating before test and also needs to be put on schedule first thing Friday morning

## 2019-05-09 NOTE — Telephone Encounter (Signed)
TELEPHONE CALL NOTE  Kimberly Peterson has been deemed a candidate for a follow-up tele-health visit to limit community exposure during the Covid-19 pandemic. I spoke with the patient via phone to ensure availability of phone/video source, confirm preferred email & phone number, and discuss instructions and expectations.  I reminded Kimberly Peterson to be prepared with any vital sign and/or heart rhythm information that could potentially be obtained via home monitoring, at the time of her visit. I reminded Kimberly Peterson to expect a phone call prior to her visit. Did the patient verbally consent to treatment as below? [place], FNP 05/09/2019 11:36 AM       FULL LENGTH CONSENT FOR TELE-HEALTH VISIT   I hereby voluntarily request, consent and authorize [facility] and its employed or contracted physicians, physician assistants, nurse practitioners or other licensed health care professionals (the Practitioner), to provide me with telemedicine health care services (the "Services") as deemed necessary by the treating Practitioner. I acknowledge and consent to receive the Services by the Practitioner via telemedicine. I understand that the telemedicine visit will involve communicating with the Practitioner through live audiovisual communication technology and the disclosure of certain medical information by electronic transmission. I acknowledge that I have been given the opportunity to request an in-person assessment or other available alternative prior to the telemedicine visit and am voluntarily participating in the telemedicine visit.  I understand that I have the right to withhold or withdraw my consent to the use of telemedicine in the course of my care at any time, without affecting my right to future care or treatment, and that the Practitioner or I may terminate the telemedicine visit at any time. I understand that I have the right to inspect all information obtained and/or  recorded in the course of the telemedicine visit and may receive copies of available information for a reasonable fee.  I understand that some of the potential risks of receiving the Services via telemedicine include:  Marland Kitchen Delay or interruption in medical evaluation due to technological equipment failure or disruption; . Information transmitted may not be sufficient (e.g. poor resolution of images) to allow for appropriate medical decision making by the Practitioner; and/or  . In rare instances, security protocols could fail, causing a breach of personal health information.  Furthermore, I acknowledge that it is my responsibility to provide information about my medical history, conditions and care that is complete and accurate to the best of my ability. I acknowledge that Practitioner's advice, recommendations, and/or decision may be based on factors not within their control, such as incomplete or inaccurate data provided by me or distortions of diagnostic images or specimens that may result from electronic transmissions. I understand that the practice of medicine is not an exact science and that Practitioner makes no warranties or guarantees regarding treatment outcomes. I acknowledge that I will receive a copy of this consent concurrently upon execution via email to the email address I last provided but may also request a printed copy by calling the office of [facility].    I understand that my insurance will be billed for this visit.   I have read or had this consent read to me. . I understand the contents of this consent, which adequately explains the benefits and risks of the Services being provided via telemedicine.  . I have been provided ample opportunity to ask questions regarding this consent and the Services and have had my questions answered to my  satisfaction. . I give my informed consent for the services to be provided through the use of telemedicine in my medical care  By participating  in this telemedicine visit I agree to the above.

## 2019-05-09 NOTE — Progress Notes (Signed)
Pt INR 2.4  Continue same med as prescribed as per dr Humphrey Rolls

## 2019-05-10 DIAGNOSIS — J449 Chronic obstructive pulmonary disease, unspecified: Secondary | ICD-10-CM | POA: Diagnosis not present

## 2019-05-10 DIAGNOSIS — E1122 Type 2 diabetes mellitus with diabetic chronic kidney disease: Secondary | ICD-10-CM | POA: Diagnosis not present

## 2019-05-10 DIAGNOSIS — I130 Hypertensive heart and chronic kidney disease with heart failure and stage 1 through stage 4 chronic kidney disease, or unspecified chronic kidney disease: Secondary | ICD-10-CM | POA: Diagnosis not present

## 2019-05-10 DIAGNOSIS — N182 Chronic kidney disease, stage 2 (mild): Secondary | ICD-10-CM | POA: Diagnosis not present

## 2019-05-10 DIAGNOSIS — I5033 Acute on chronic diastolic (congestive) heart failure: Secondary | ICD-10-CM | POA: Diagnosis not present

## 2019-05-10 DIAGNOSIS — J9621 Acute and chronic respiratory failure with hypoxia: Secondary | ICD-10-CM | POA: Diagnosis not present

## 2019-05-11 ENCOUNTER — Other Ambulatory Visit: Payer: Self-pay

## 2019-05-11 ENCOUNTER — Encounter: Payer: Self-pay | Admitting: Family

## 2019-05-11 ENCOUNTER — Ambulatory Visit: Payer: Medicare Other | Attending: Family | Admitting: Family

## 2019-05-11 ENCOUNTER — Other Ambulatory Visit (HOSPITAL_COMMUNITY): Payer: Self-pay

## 2019-05-11 VITALS — BP 166/67 | HR 74 | Temp 96.0°F | Wt 183.0 lb

## 2019-05-11 DIAGNOSIS — I890 Lymphedema, not elsewhere classified: Secondary | ICD-10-CM

## 2019-05-11 DIAGNOSIS — I10 Essential (primary) hypertension: Secondary | ICD-10-CM

## 2019-05-11 DIAGNOSIS — I5032 Chronic diastolic (congestive) heart failure: Secondary | ICD-10-CM

## 2019-05-11 NOTE — Progress Notes (Signed)
Paramedicine Encounter    Patient ID: Kimberly Peterson, female    DOB: 01-01-1930, 85 y.o.   MRN: [id number]   Patient Care Team: [name], MD as PCP - General (Internal Medicine) Valente David, RN as [place] [facility] [place], Connye Burkitt, LCSW as Education officer, museum (Licensed Holiday representative)  Patient Active Problem List   Diagnosis Date Noted  . Acute on chronic diastolic heart failure ([place]) 04/27/2019  . Acute and chronic respiratory failure with hypoxia ([place]) 03/01/2019  . Acute respiratory failure with hypoxia ([place]) 02/07/2019  . Type 2 diabetes with peripheral circulatory disorder, controlled ([place]) 10/17/2018  . Complete heart block ([place]) 09/09/2018  . Severe aortic valve stenosis 09/09/2018  . Lymphedema 08/12/2018  . Uncontrolled type 2 diabetes mellitus with hyperglycemia ([place]) 02/25/2018  . Atrial fibrillation ([place]) 02/25/2018  . Encounter for current long-term use of anticoagulants 02/25/2018  . Dependence on supplemental oxygen 02/25/2018  . Lower extremity edema 02/25/2018  . CHF (congestive heart failure) ([place]) 12/04/2015  . Diabetes mellitus ([place]) 12/04/2015  . Chronic obstructive pulmonary disease ([place]) 09/20/2015  . Diastolic CHF, acute on chronic (H[facility]) 09/19/2015  . Benign essential HTN 09/19/2015  . Controlled diabetes mellitus without complication, with long-term current use of insulin ([place]) 09/19/2015  . Hyperlipidemia, mixed 09/04/2014  . Pulmonary embolism ([place]) 09/23/2013    Current Outpatient Medications:  .  aspirin 81 MG tablet, Take 81 mg by mouth daily., Disp: , Rfl:  .  atorvastatin (LIPITOR) 10 MG tablet, TAKE 1 TABLET BY MOUTH ONCE DAILY FOR CHOLESTEROL (Patient taking differently: Take 10 mg by mouth daily. ), Disp: 90 tablet, Rfl: 3 .  CARTIA XT 240 MG 24 hr capsule, Take 1 capsule by mouth once daily (Patient taking differently: Take 240 mg by mouth daily. ), Disp: 90 capsule, Rfl: 1 .  hydrALAZINE (APRESOLINE) 25 MG tablet,  TAKE 1 & 1/2 (ONE & ONE-HALF) TABLETS BY MOUTH THREE TIMES DAILY FOR BLOOD PRESSURE (Patient taking differently: Take 37.5 mg by mouth 3 (three) times daily. ), Disp: 405 tablet, Rfl: 0 .  insulin aspart protamine- aspart (NOVOLOG MIX 70/30) (70-30) 100 UNIT/ML injection, Inject 21-34 Units into the skin See admin instructions. Inject 34u under the skin every morning and inject 21u under the skin every evening, Disp: , Rfl:  .  pantoprazole (PROTONIX) 40 MG tablet, Take one tab po qd for stomach (Patient taking differently: Take 40 mg by mouth daily. ), Disp: 90 tablet, Rfl: 1 .  torsemide ([name]) 20 MG tablet, Take 2 tablets (40 mg total) by mouth daily., Disp: 60 tablet, Rfl: 0 .  warfarin (COUMADIN) 5 MG tablet, TAKE ONE TABLET BY MOUTH ONCE DAILY AT 5-6PM AND TAKE ONE AND ONE-HALF TABLET BY MOUTH ON THURSDAY (Patient taking differently: Take 5-7.5 mg by mouth See admin instructions. Take 1 tablet (5mg ) by mouth every Monday, Tuesday. Wednesday, Friday, Saturday and Sunday evening and take 1 tablets (7.5mg ) by mouth every Thursday evening), Disp: 90 tablet, Rfl: 4 .  carvedilol (COREG) 6.25 MG tablet, Take 1 tablet (6.25 mg total) by mouth 2 (two) times daily with a meal., Disp: 180 tablet, Rfl: 3 .  potassium chloride SA (KLOR-CON) 20 MEQ tablet, Take 20 mEq by mouth daily., Disp: , Rfl:  No Known Allergies    Social History   Socioeconomic History  . Marital status: Married    Spouse name: Not on file  . Number of children: Not on file  . Years of education: Not  on file  . Highest education level: Not on file  Occupational History  . Not on file  Social Needs  . Financial resource strain: Not hard at all  . Food insecurity    Worry: Never true    Inability: Never true  . Transportation needs    Medical: No    Non-medical: No  Tobacco Use  . Smoking status: Never Smoker  . Smokeless tobacco: Never Used  Substance and Sexual Activity  . Alcohol use: No  . Drug use: No  .  Sexual activity: Never  Lifestyle  . Physical activity    Days per week: Not on file    Minutes per session: Not on file  . Stress: Not on file  Relationships  . Social Herbalist on phone: Not on file    Gets together: Not on file    Attends religious service: Not on file    Active member of club or organization: Not on file    Attends meetings of clubs or organizations: Not on file    Relationship status: Not on file  . Intimate partner violence    Fear of current or ex partner: Not on file    Emotionally abused: Not on file    Physically abused: Not on file    Forced sexual activity: Not on file  Other Topics Concern  . Not on file  Social History Narrative  . Not on file    Physical Exam      Future Appointments  Date Time Provider [place]  05/11/2019  1:00 PM Alisa Graff, FNP [facility] None  05/16/2019 10:45 AM Berneta Sages, NP [facility] None  05/19/2019  9:30 AM Valente David, RN [facility] None  06/07/2019 12:00 PM [name], MD [facility] None  06/13/2019 11:30 AM Ronnell Freshwater, NP [facility] None  07/13/2019  9:20 AM Alisa Graff, FNP [facility] None  08/15/2019 10:30 AM Allyne Gee, MD [facility] None    BP (!) 142/60   Pulse 76   Temp 97.8 F (36.6 C)   Resp 18   Wt 183 lb (83 kg)   SpO2 98%   BMI 33.47 kg/m  CBG PTA-130 Weight yesterday-182  First visit with the patient in the home. She lives here with her daughter in a house.  She reports [facility] week and also her daughter is a CNA and takes her v/s every morning and does what the nurse does.  She has a log of her v/s and weights.   Her PCP took her off the potassium--will let TIna know.  She has stopped the potassium since post hosp d/c.  She reports dr Humphrey Rolls tries to take over all her care and advised she didn't need to be seen by heart doctor.  meds verified--she takes meds from bottles-she is able to verbalize how to take each and every pill  without issues.  She states she came home from dentist then ended up in the hosp that evening out of the blue. I advised her things to be aware of like the weight gain and increased swelling to the legs/bloating/decreased appetite. She denies any issues with medications.  Daughter cooks for her. She denies using table salt or eating foods with a lot of salt in it.   Her b/p readings are elevated from what daughter is getting.  Will also relay to [place].  Uses 02 continuously at 2LPM. Uses CPAP nightly.  Lungs clear  She does have pitting edema to both legs, the rt leg is more than the left-she reports that it is always bigger than the left.   She is c/o pain to her back area below her shoulder blade area and is being worked up for that and has [place] scheduled.   She reports getting around the house ok without any issues.   B/p @ home-140/60-176/75 Weights @ [phone]    Marylouise Stacks, [place] Paramedic  05/11/19

## 2019-05-11 NOTE — Patient Instructions (Signed)
Continue weighing daily and call for an overnight weight gain of > 2 pounds or a weekly weight gain of >5 pounds. 

## 2019-05-11 NOTE — Progress Notes (Signed)
Virtual Visit via Telephone Note   Evaluation Performed:  Follow-up visit  This visit type was conducted due to national recommendations for restrictions regarding the COVID-19 Pandemic (e.g. social distancing).  This format is felt to be most appropriate for this patient at this time.  All issues noted in this document were discussed and addressed.  No physical exam was performed (except for noted visual exam findings with Video Visits).  Please refer to the patient's chart (MyChart message for video visits and phone note for telephone visits) for the patient's consent to telehealth for [facility]  Date:  05/11/2019   ID:  Kimberly Peterson, DOB [birth year], MRN [id number]  Patient Location:  [place] [address]   Provider location:   [facility] [place] [address], [place] [address]  PCP:  [name], MD  Cardiologist:  Electrophysiologist:  None   Chief Complaint:  Fatigue  History of Present Illness:    Kimberly Peterson is a 85 y.o. female who presents via audio/video conferencing for a telehealth visit today.  Patient verified DOB and address.  The patient does not have symptoms concerning for COVID-19 infection (fever, chills, cough, or new SHORTNESS OF BREATH).   Patient reports minimal fatigue upon moderate exertion. She describes this as having been present for several years. She has associated back pain/ tightness along with this. She denies any dizziness, swelling in her legs/abdomen, palpitations, chest pain, shortness of breath, cough, difficulty sleeping or weight gain. Home weight has ranged from 182-184 pounds.   [facility] coming weekly along with paramedic. Says that she is supposed to have an ultrasound done in the next couple of weeks to figure out what's going on with her back  Prior CV studies:   The following studies were reviewed today:  Echocardiogram completed 04/29/2019 but  am currently unable to review the results  Past Medical History:  Diagnosis Date  . CHF (congestive heart failure) ([place])   . Chronic kidney disease   . COPD (chronic obstructive pulmonary disease) ([place])   . Diabetes mellitus without complication ([place])   . Hypertension    Past Surgical History:  Procedure Laterality Date  . aoric valve replacemet      [employer] Jude  . CATARACT EXTRACTION, BILATERAL    . PACEMAKER PLACEMENT    . VALVE REPLACEMENT       Prior to Admission medications   Medication Sig Start Date End Date Taking? Authorizing Provider  aspirin 81 MG tablet Take 81 mg by mouth daily.   Yes [provider]  atorvastatin (LIPITOR) 10 MG tablet TAKE 1 TABLET BY MOUTH ONCE DAILY FOR CHOLESTEROL Patient taking differently: Take 10 mg by mouth daily.  12/06/18  Yes Scarboro, Audie Clear, NP  CARTIA XT 240 MG 24 hr capsule Take 1 capsule by mouth once daily Patient taking differently: Take 240 mg by mouth daily.  04/06/19  Yes [name], MD  carvedilol (COREG) 6.25 MG tablet Take 1 tablet (6.25 mg total) by mouth 2 (two) times daily with a meal. 03/22/19 05/11/19 Yes [name], MD  hydrALAZINE (APRESOLINE) 25 MG tablet TAKE 1 & 1/2 (ONE & ONE-HALF) TABLETS BY MOUTH THREE TIMES DAILY FOR BLOOD PRESSURE Patient taking differently: Take 37.5 mg by mouth 3 (three) times daily.  03/10/19  Yes Scarboro, Audie Clear, NP  insulin aspart protamine- aspart (NOVOLOG MIX 70/30) (70-30) 100 UNIT/ML injection Inject 21-34 Units  into the skin See admin instructions. Inject 32u under the skin every morning and inject 24u nder the skin every evening   Yes [provider]  pantoprazole (PROTONIX) 40 MG tablet Take one tab po qd for stomach Patient taking differently: Take 40 mg by mouth daily.  03/22/19  Yes [name], MD  torsemide ([name]) 20 MG tablet Take 2 tablets (40 mg total) by mouth daily. 05/01/19  Yes Sudini, Alveta Heimlich, MD  warfarin (COUMADIN) 5 MG tablet TAKE ONE TABLET BY MOUTH  ONCE DAILY AT 5-6PM AND TAKE ONE AND ONE-HALF TABLET BY MOUTH ON THURSDAY Patient taking differently: Take 5-7.5 mg by mouth See admin instructions. Take 1 tablet (5mg ) by mouth every Monday, Tuesday. Wednesday, Friday, Saturday and Sunday evening and take 1 tablets (7.5mg ) by mouth every Thursday evening 02/22/19  Yes Boscia, Heather E, NP  potassium chloride SA (KLOR-CON) 20 MEQ tablet Take 20 mEq by mouth daily.    [provider]      Allergies:   Patient has no known allergies.   Social History   Tobacco Use  . Smoking status: Never Smoker  . Smokeless tobacco: Never Used  Substance Use Topics  . Alcohol use: No  . Drug use: No     Family Hx: The patient's family history is not on file.  ROS:   Please see the history of present illness.     All other systems reviewed and are negative.   Labs/Other Tests and Data Reviewed:    Recent Labs: 04/27/2019: ALT 12; TSH 0.500 04/29/2019: B Natriuretic Peptide 255.0 05/01/2019: BUN 28; Creatinine, Ser 1.16; Hemoglobin 8.2; Magnesium 2.3; Platelets 233; Potassium 3.5; Sodium 140   Recent Lipid Panel Lab Results  Component Value Date/Time   CHOL 169 09/28/2017 09:28 AM   CHOL 160 08/18/2013 12:41 AM   TRIG 128 09/28/2017 09:28 AM   TRIG 80 08/18/2013 12:41 AM   HDL 56 09/28/2017 09:28 AM   HDL 68 (H) 08/18/2013 12:41 AM   LDLCALC 87 09/28/2017 09:28 AM   LDLCALC 76 08/18/2013 12:41 AM    Wt Readings from Last 3 Encounters:  05/11/19 183 lb (83 kg)  05/05/19 182 lb 12.8 oz (82.9 kg)  05/01/19 197 lb 15.6 oz (89.8 kg)     Exam:    Vital Signs:   Vitals:   05/11/19 1300  BP: (!) 166/67  Pulse: 74  Temp: (!) 96 F (35.6 C)  TempSrc: Oral  SpO2: 94%  Weight: 183 lb (83 kg)   Wt Readings from Last 3 Encounters:  05/11/19 183 lb (83 kg)  05/11/19 183 lb (83 kg)  05/05/19 182 lb 12.8 oz (82.9 kg)     Well nourished, well developed female in no  acute distress.   ASSESSMENT & PLAN:    1. Chronic  heart failure with preserved ejection fraction- - NYHA class II - euvolemic today based on patient's description of symptoms - weighing daily and home weight has been stable; reminded to call for an overnight weight gain of >2 pounds or a weekly weight gain of >5 pounds - drinking about 15 ounces of water and 1/2 -1 cup of coffee daily; explained that she needed to drink at least 30 ounces of water daily - not adding salt to her food - palliative care had telemedicine visit with patient 03/24/2019 - had telemedicine visit with cardiology [name]) 10/28/2018 - saw pulmonology Humphrey Rolls) 04/12/2019 - BNP 04/29/2019 was 255.0 - is now participating in paramedicine program - has  [facility] coming  2: HTN- - BP elevated today but she's having back pain/tightness; may need to adjust medications if BP remains elevated - saw PCP Stephanie Coup) 05/05/2019 - BMP from 05/01/2019 reviewed and showed sodium 140, potassium 3.5, creatinine 1.16 and GFR 49  3: Lymphedema- - stage 2 - wearing TED hose daily with removal at bedtime - limited in her ability to exercise due to symptoms - elevating legs in the bed - edema has improved per patient since diuretic was changed to torsemide - consider lymphapress compression boots if edema persists    COVID-19 Education: The signs and symptoms of COVID-19 were discussed with the patient and how to seek care for testing (follow up with PCP or arrange E-visit).  The importance of social distancing was discussed today.  Patient Risk:   After full review of this patients clinical status, I feel that they are at least moderate risk at this time.  Time:   Today, I have spent 10 minutes with the patient with telehealth technology discussing medications and symptoms to report   Medication Adjustments/Labs and Tests Ordered: Current medicines are reviewed at length with the patient today.  Concerns regarding medicines are outlined above.   Tests Ordered: No  orders of the defined types were placed in this encounter.  Medication Changes: No orders of the defined types were placed in this encounter.   Disposition:  Follow-up in 3 months or sooner for any questions/problems before then.   Signed, Alisa Graff, FNP  05/11/2019 12:54 PM    [facility]

## 2019-05-12 DIAGNOSIS — Z6836 Body mass index (BMI) 36.0-36.9, adult: Secondary | ICD-10-CM | POA: Diagnosis not present

## 2019-05-12 DIAGNOSIS — I130 Hypertensive heart and chronic kidney disease with heart failure and stage 1 through stage 4 chronic kidney disease, or unspecified chronic kidney disease: Secondary | ICD-10-CM | POA: Diagnosis not present

## 2019-05-12 DIAGNOSIS — Z952 Presence of prosthetic heart valve: Secondary | ICD-10-CM | POA: Diagnosis not present

## 2019-05-12 DIAGNOSIS — Z7901 Long term (current) use of anticoagulants: Secondary | ICD-10-CM | POA: Diagnosis not present

## 2019-05-12 DIAGNOSIS — I442 Atrioventricular block, complete: Secondary | ICD-10-CM | POA: Diagnosis not present

## 2019-05-12 DIAGNOSIS — J9621 Acute and chronic respiratory failure with hypoxia: Secondary | ICD-10-CM | POA: Diagnosis not present

## 2019-05-12 DIAGNOSIS — G4730 Sleep apnea, unspecified: Secondary | ICD-10-CM | POA: Diagnosis not present

## 2019-05-12 DIAGNOSIS — Z7982 Long term (current) use of aspirin: Secondary | ICD-10-CM | POA: Diagnosis not present

## 2019-05-12 DIAGNOSIS — Z9981 Dependence on supplemental oxygen: Secondary | ICD-10-CM | POA: Diagnosis not present

## 2019-05-12 DIAGNOSIS — E1122 Type 2 diabetes mellitus with diabetic chronic kidney disease: Secondary | ICD-10-CM | POA: Diagnosis not present

## 2019-05-12 DIAGNOSIS — Z9181 History of falling: Secondary | ICD-10-CM | POA: Diagnosis not present

## 2019-05-12 DIAGNOSIS — Z950 Presence of cardiac pacemaker: Secondary | ICD-10-CM | POA: Diagnosis not present

## 2019-05-12 DIAGNOSIS — E669 Obesity, unspecified: Secondary | ICD-10-CM | POA: Diagnosis not present

## 2019-05-12 DIAGNOSIS — I5033 Acute on chronic diastolic (congestive) heart failure: Secondary | ICD-10-CM | POA: Diagnosis not present

## 2019-05-12 DIAGNOSIS — I480 Paroxysmal atrial fibrillation: Secondary | ICD-10-CM | POA: Diagnosis not present

## 2019-05-12 DIAGNOSIS — Z794 Long term (current) use of insulin: Secondary | ICD-10-CM | POA: Diagnosis not present

## 2019-05-12 DIAGNOSIS — J449 Chronic obstructive pulmonary disease, unspecified: Secondary | ICD-10-CM | POA: Diagnosis not present

## 2019-05-12 DIAGNOSIS — N182 Chronic kidney disease, stage 2 (mild): Secondary | ICD-10-CM | POA: Diagnosis not present

## 2019-05-12 DIAGNOSIS — I2699 Other pulmonary embolism without acute cor pulmonale: Secondary | ICD-10-CM | POA: Diagnosis not present

## 2019-05-13 NOTE — Progress Notes (Signed)
Pt 3.1 continue same med

## 2019-05-13 NOTE — Progress Notes (Signed)
Pt INR 2.7 continue same med  

## 2019-05-16 ENCOUNTER — Other Ambulatory Visit: Payer: Self-pay

## 2019-05-16 ENCOUNTER — Ambulatory Visit: Payer: Medicare Other | Admitting: Internal Medicine

## 2019-05-16 ENCOUNTER — Ambulatory Visit (INDEPENDENT_AMBULATORY_CARE_PROVIDER_SITE_OTHER): Payer: Medicare Other

## 2019-05-16 ENCOUNTER — Other Ambulatory Visit: Payer: Medicare Other | Admitting: Adult Health Nurse Practitioner

## 2019-05-16 DIAGNOSIS — E1122 Type 2 diabetes mellitus with diabetic chronic kidney disease: Secondary | ICD-10-CM | POA: Diagnosis not present

## 2019-05-16 DIAGNOSIS — J9621 Acute and chronic respiratory failure with hypoxia: Secondary | ICD-10-CM | POA: Diagnosis not present

## 2019-05-16 DIAGNOSIS — I5033 Acute on chronic diastolic (congestive) heart failure: Secondary | ICD-10-CM | POA: Diagnosis not present

## 2019-05-16 DIAGNOSIS — N182 Chronic kidney disease, stage 2 (mild): Secondary | ICD-10-CM | POA: Diagnosis not present

## 2019-05-16 DIAGNOSIS — J449 Chronic obstructive pulmonary disease, unspecified: Secondary | ICD-10-CM | POA: Diagnosis not present

## 2019-05-16 DIAGNOSIS — Z7901 Long term (current) use of anticoagulants: Secondary | ICD-10-CM

## 2019-05-16 DIAGNOSIS — Z515 Encounter for palliative care: Secondary | ICD-10-CM

## 2019-05-16 DIAGNOSIS — I130 Hypertensive heart and chronic kidney disease with heart failure and stage 1 through stage 4 chronic kidney disease, or unspecified chronic kidney disease: Secondary | ICD-10-CM | POA: Diagnosis not present

## 2019-05-16 DIAGNOSIS — I5022 Chronic systolic (congestive) heart failure: Secondary | ICD-10-CM

## 2019-05-16 NOTE — Progress Notes (Signed)
[facility] Consult Note Telephone: [phone]  Fax: [phone]  PATIENT NAME: Kimberly Peterson DOB: [birth year] MRN: [id number]  PRIMARY CARE PROVIDER:   [name], MD  REFERRING PROVIDER:  [name], [address] [place],  [place] [address]  RESPONSIBLE PARTY:   Self [phone]; Daughter Anne Hahn [phone]    RECOMMENDATIONS and PLAN:  1.  Advanced care planning.  Patient is a full code.  Expresses she wants to a ventilator if needed, does not want to be put on life support, antibiotics and IV fluids as indicated, no feeding tube. Filled out MOST, uploaded to [facility] and left original with patient.    2.  CHF.  Patient had recent hospitalization 10/21-10/25/2020 for pulmonary edema.  Stated that the SOB came on her suddenly when she was returning from a dentist appointment. States that she usually will have increased leg edema with CHF exacerbation but she did not have that this time.  She states today that her breathing is improved.  Denies any increased cough or SOB, chest pain, palpitations. BP today is 126/60 HR 69 and O2sat 99% on 2L.  Checks weights daily and has been maintaining around 182-184.  She is not currently on PT/OT.  States that she does not need it.  She lives with her daughter who helps with ADLs and IADLs.  She is able to walk with walker.  Is continent of bowel and bladder.  She is currently on torsemide 40mg  daily and wears TED hose daily. She is being followed by Paramedic program. Continue current plan of care and follow up with Paramedic.  3.  Midback discomfort.  Patient states that every now and then she gets a discomfort on the right side of her midback. States that it feels like "a balloon inflating and going down."  States that it lasts 3-4 days.  She does get relief with Tylenol and heating pad.  Has discussed this with her PCP and is in process of scheduling an [place] to see if it may be gall stones.  Keep follow up and diagnostic testing/imaging.  Overall patient is doing well at this time.  Denies pain, N/V/D, constipation, fever, headaches, dizziness.  Appetite is good with no weight loss reported.  Palliative will continue to monitor for symptom management and decline and make recommendations as needed.  Have appointment in one month.  I spent 50 minutes providing this consultation,  from 11:00 to 11:50. More than 50% of the time in this consultation was spent coordinating communication.   HISTORY OF PRESENT ILLNESS:  SAHNIYA Peterson is a 85 y.o. year old female with multiple medical problems including COPD, dependent on supplemental oxygen, chronic respiratory failure with hypoxia, congestive heart failure, atrial fibrillation, pulmonary embolism, complete heart block s / p pacemaker placement, chronic kidney disease, hypertension, diabetes, severe aortic valve stenosis, aortic valve replacement, cataract extraction bilaterally, lymphedema. Palliative Care was asked to help address goals of care.   CODE STATUS: full code  PPS: 50% HOSPICE ELIGIBILITY/DIAGNOSIS: TBD  PHYSICAL EXAM:   General: NAD, frail appearing Cardiovascular: regular rate and rhythm Pulmonary: lung sounds diminished but no abnormal breath sounds heard; normal respiratory effort Abdomen: soft, nontender, + bowel sounds GU: no suprapubic tenderness Extremities: 1+ pitting edema noted to bilateral feet; no joint deformities Skin: no rashes Neurological: Weakness but otherwise nonfocal   PAST MEDICAL HISTORY:  Past Medical History:  Diagnosis Date   CHF (congestive heart failure) ([place])  Chronic kidney disease    COPD (chronic obstructive pulmonary disease) ([facility])    Diabetes mellitus without complication ([place])    Hypertension     SOCIAL HX:  Social History   Tobacco Use   Smoking status: Never Smoker   Smokeless tobacco: Never Used  Substance Use Topics   Alcohol use: No    ALLERGIES: No  Known Allergies   PERTINENT MEDICATIONS:  Outpatient Encounter Medications as of 05/16/2019  Medication Sig   aspirin 81 MG tablet Take 81 mg by mouth daily.   atorvastatin (LIPITOR) 10 MG tablet TAKE 1 TABLET BY MOUTH ONCE DAILY FOR CHOLESTEROL (Patient taking differently: Take 10 mg by mouth daily. )   CARTIA XT 240 MG 24 hr capsule Take 1 capsule by mouth once daily (Patient taking differently: Take 240 mg by mouth daily. )   carvedilol (COREG) 6.25 MG tablet Take 1 tablet (6.25 mg total) by mouth 2 (two) times daily with a meal.   hydrALAZINE (APRESOLINE) 25 MG tablet TAKE 1 & 1/2 (ONE & ONE-HALF) TABLETS BY MOUTH THREE TIMES DAILY FOR BLOOD PRESSURE (Patient taking differently: Take 37.5 mg by mouth 3 (three) times daily. )   insulin aspart protamine- aspart (NOVOLOG MIX 70/30) (70-30) 100 UNIT/ML injection Inject 21-34 Units into the skin See admin instructions. Inject 32u under the skin every morning and inject 24u nder the skin every evening   pantoprazole (PROTONIX) 40 MG tablet Take one tab po qd for stomach (Patient taking differently: Take 40 mg by mouth daily. )   potassium chloride SA (KLOR-CON) 20 MEQ tablet Take 20 mEq by mouth daily.   torsemide ([name]) 20 MG tablet Take 2 tablets (40 mg total) by mouth daily.   warfarin (COUMADIN) 5 MG tablet TAKE ONE TABLET BY MOUTH ONCE DAILY AT 5-6PM AND TAKE ONE AND ONE-HALF TABLET BY MOUTH ON THURSDAY (Patient taking differently: Take 5-7.5 mg by mouth See admin instructions. Take 1 tablet (5mg ) by mouth every Monday, Tuesday. Wednesday, Friday, Saturday and Sunday evening and take 1 tablets (7.5mg ) by mouth every Thursday evening)   No facility-administered encounter medications on file as of 05/16/2019.      Oscar Forman Jenetta Downer, NP

## 2019-05-16 NOTE — Progress Notes (Signed)
Pt INR 3.0 continue same med

## 2019-05-18 ENCOUNTER — Other Ambulatory Visit (HOSPITAL_COMMUNITY): Payer: Self-pay

## 2019-05-18 NOTE — Progress Notes (Signed)
Paramedicine Encounter    Patient ID: Kimberly Peterson, female    DOB: 1929-08-02, 85 y.o.   MRN: [id number]   Patient Care Team: [name], MD as PCP - General (Internal Medicine) Valente David, RN as [place] Management [place], Connye Burkitt, LCSW as Education officer, museum (Licensed Holiday representative)  Patient Active Problem List   Diagnosis Date Noted  . Acute on chronic diastolic heart failure ([place]) 04/27/2019  . Acute and chronic respiratory failure with hypoxia ([place]) 03/01/2019  . Acute respiratory failure with hypoxia ([place]) 02/07/2019  . Type 2 diabetes with peripheral circulatory disorder, controlled ([place]) 10/17/2018  . Complete heart block ([place]) 09/09/2018  . Severe aortic valve stenosis 09/09/2018  . Lymphedema 08/12/2018  . Uncontrolled type 2 diabetes mellitus with hyperglycemia ([place]) 02/25/2018  . Atrial fibrillation ([place]) 02/25/2018  . Encounter for current long-term use of anticoagulants 02/25/2018  . Dependence on supplemental oxygen 02/25/2018  . Lower extremity edema 02/25/2018  . CHF (congestive heart failure) ([place]) 12/04/2015  . Diabetes mellitus ([place]) 12/04/2015  . Chronic obstructive pulmonary disease ([place]) 09/20/2015  . Diastolic CHF, acute on chronic (HCC) 09/19/2015  . Benign essential HTN 09/19/2015  . Controlled diabetes mellitus without complication, with long-term current use of insulin ([place]) 09/19/2015  . Hyperlipidemia, mixed 09/04/2014  . Pulmonary embolism ([place]) 09/23/2013    Current Outpatient Medications:  .  aspirin 81 MG tablet, Take 81 mg by mouth daily., Disp: , Rfl:  .  atorvastatin (LIPITOR) 10 MG tablet, TAKE 1 TABLET BY MOUTH ONCE DAILY FOR CHOLESTEROL (Patient taking differently: Take 10 mg by mouth daily. ), Disp: 90 tablet, Rfl: 3 .  CARTIA XT 240 MG 24 hr capsule, Take 1 capsule by mouth once daily (Patient taking differently: Take 240 mg by mouth daily. ), Disp: 90 capsule, Rfl: 1 .  carvedilol (COREG) 6.25 MG tablet, Take  1 tablet (6.25 mg total) by mouth 2 (two) times daily with a meal., Disp: 180 tablet, Rfl: 3 .  hydrALAZINE (APRESOLINE) 25 MG tablet, TAKE 1 & 1/2 (ONE & ONE-HALF) TABLETS BY MOUTH THREE TIMES DAILY FOR BLOOD PRESSURE (Patient taking differently: Take 37.5 mg by mouth 3 (three) times daily. ), Disp: 405 tablet, Rfl: 0 .  insulin aspart protamine- aspart (NOVOLOG MIX 70/30) (70-30) 100 UNIT/ML injection, Inject 21-34 Units into the skin See admin instructions. Inject 32u under the skin every morning and inject 24u nder the skin every evening, Disp: , Rfl:  .  pantoprazole (PROTONIX) 40 MG tablet, Take one tab po qd for stomach (Patient taking differently: Take 40 mg by mouth daily. ), Disp: 90 tablet, Rfl: 1 .  potassium chloride SA (KLOR-CON) 20 MEQ tablet, Take 20 mEq by mouth daily., Disp: , Rfl:  .  torsemide ([name]) 20 MG tablet, Take 2 tablets (40 mg total) by mouth daily., Disp: 60 tablet, Rfl: 0 .  warfarin (COUMADIN) 5 MG tablet, TAKE ONE TABLET BY MOUTH ONCE DAILY AT 5-6PM AND TAKE ONE AND ONE-HALF TABLET BY MOUTH ON THURSDAY (Patient taking differently: Take 5-7.5 mg by mouth See admin instructions. Take 1 tablet (5mg ) by mouth every Monday, Tuesday. Wednesday, Friday, Saturday and Sunday evening and take 1 tablets (7.5mg ) by mouth every Thursday evening), Disp: 90 tablet, Rfl: 4 No Known Allergies    Social History   Socioeconomic History  . Marital status: Married    Spouse name: Not on file  . Number of children: Not on file  . Years of education: Not  on file  . Highest education level: Not on file  Occupational History  . Not on file  Social Needs  . Financial resource strain: Not hard at all  . Food insecurity    Worry: Never true    Inability: Never true  . Transportation needs    Medical: No    Non-medical: No  Tobacco Use  . Smoking status: Never Smoker  . Smokeless tobacco: Never Used  Substance and Sexual Activity  . Alcohol use: No  . Drug use: No  . Sexual  activity: Never  Lifestyle  . Physical activity    Days per week: Not on file    Minutes per session: Not on file  . Stress: Not on file  Relationships  . Social Herbalist on phone: Not on file    Gets together: Not on file    Attends religious service: Not on file    Active member of club or organization: Not on file    Attends meetings of clubs or organizations: Not on file    Relationship status: Not on file  . Intimate partner violence    Fear of current or ex partner: Not on file    Emotionally abused: Not on file    Physically abused: Not on file    Forced sexual activity: Not on file  Other Topics Concern  . Not on file  Social History Narrative  . Not on file    Physical Exam      Future Appointments  Date Time Provider [place]  05/19/2019  9:30 AM Valente David, RN [facility] None  06/07/2019 12:00 PM [name], MD [facility] None  06/10/2019  9:00 AM NOVA-US IMAGING [facility] None  06/13/2019 11:30 AM Ronnell Freshwater, NP [facility] None  07/13/2019  9:20 AM Alisa Graff, FNP [facility] None  08/15/2019 10:30 AM Allyne Gee, MD [facility] None    BP (!) 132/54   Pulse 78   Temp 98 F (36.7 C)   Resp 18   Wt 184 lb (83.5 kg)   SpO2 97%   BMI 33.65 kg/m   Weight yesterday-183 Last visit weight-183  Pt sitting in her chair when I arrived, pt denies any issues or complaints at this time.  She states her breathing is doing well. She had nurse the other day from palliative care, also still has nurse with home services as well.  She has a lot of resources in the home including her daughter who is a CNA and assists with her daily.  She is wearing her compression stockings.  Minimal swelling today. She looks good today.  Her b/p is much improved from last week.  Will f/u in a couple weeks. I sent tina a message to confirm the d/c of her potassium.   Marylouise Stacks, [place] [facility] Paramedic  05/18/19

## 2019-05-19 ENCOUNTER — Other Ambulatory Visit: Payer: Self-pay | Admitting: *Deleted

## 2019-05-19 NOTE — Patient Outreach (Signed)
[facility]) Care Management  05/19/2019  Kimberly Peterson [birth year] [id number]   Call placed to member to follow up on heart failure management.  She report she is doing "pretty good."  Denies an shortness of breath or chest discomfort at this time.  State her weight is now down to 180 pounds, report she feels Torsemide is working much better than Furosemide.  She has had follow ups with PCP, HF clinic, and Palliative Care since last outreach.  Remains active with home health and confirms that paramedicine program has started.  Denies any urgent concerns at this time, will follow up within the next month.  THN CM Care Plan Problem One     Most Recent Value  Care Plan Problem One  Risk for readmission related to heart failure management as evidenced by recent hospitalization  Role Documenting the Problem One  Care Management [place] for Problem One  Active  [facility] Long Term Goal   Member will not be readmitted to hospital within the next 31 days  THN Long Term Goal Start Date  05/03/19  Interventions for Problem One Long Term Goal  Reviewed heart failure management with member, complications of not following plan of care discussed.  THN CM Short Term Goal #1   Member will report compliance with paramedicine visits over the next 4 weeks  THN CM Short Term Goal #1 Start Date  04/21/19  Interventions for Short Term Goal #1  Confirmed member has had initial visit with EMT, advised of importance of allowing recurrent visits in effort to manage heart failure  THN CM Short Term Goal #2   Member will report keeping and completing appointment with palliative care NP within the next 2 weeks  THN CM Short Term Goal #2 Start Date  04/21/19  [facility] CM Short Term Goal #2 Met Date  05/19/19     Valente David, RN, MSN [place] [phone]

## 2019-05-23 ENCOUNTER — Ambulatory Visit (INDEPENDENT_AMBULATORY_CARE_PROVIDER_SITE_OTHER): Payer: Medicare Other

## 2019-05-23 ENCOUNTER — Other Ambulatory Visit: Payer: Self-pay

## 2019-05-23 DIAGNOSIS — I4821 Permanent atrial fibrillation: Secondary | ICD-10-CM | POA: Diagnosis not present

## 2019-05-23 DIAGNOSIS — Z7901 Long term (current) use of anticoagulants: Secondary | ICD-10-CM | POA: Diagnosis not present

## 2019-05-23 NOTE — Progress Notes (Signed)
Pt INR 3.5 as per dr Humphrey Rolls skip coumadin every  Monday and continue same med  as prescribed

## 2019-05-24 ENCOUNTER — Telehealth: Payer: Self-pay

## 2019-05-24 NOTE — Telephone Encounter (Signed)
Patient's daughter has been advised to start back coumadin after her tooth extraction. Kimberly Peterson

## 2019-05-25 DIAGNOSIS — I130 Hypertensive heart and chronic kidney disease with heart failure and stage 1 through stage 4 chronic kidney disease, or unspecified chronic kidney disease: Secondary | ICD-10-CM | POA: Diagnosis not present

## 2019-05-25 DIAGNOSIS — E1122 Type 2 diabetes mellitus with diabetic chronic kidney disease: Secondary | ICD-10-CM | POA: Diagnosis not present

## 2019-05-25 DIAGNOSIS — I5033 Acute on chronic diastolic (congestive) heart failure: Secondary | ICD-10-CM | POA: Diagnosis not present

## 2019-05-25 DIAGNOSIS — J449 Chronic obstructive pulmonary disease, unspecified: Secondary | ICD-10-CM | POA: Diagnosis not present

## 2019-05-25 DIAGNOSIS — N182 Chronic kidney disease, stage 2 (mild): Secondary | ICD-10-CM | POA: Diagnosis not present

## 2019-05-25 DIAGNOSIS — J9621 Acute and chronic respiratory failure with hypoxia: Secondary | ICD-10-CM | POA: Diagnosis not present

## 2019-05-27 ENCOUNTER — Other Ambulatory Visit: Payer: Self-pay | Admitting: Internal Medicine

## 2019-05-27 DIAGNOSIS — R630 Anorexia: Secondary | ICD-10-CM

## 2019-05-30 ENCOUNTER — Ambulatory Visit (INDEPENDENT_AMBULATORY_CARE_PROVIDER_SITE_OTHER): Payer: Medicare Other

## 2019-05-30 ENCOUNTER — Other Ambulatory Visit: Payer: Self-pay

## 2019-05-30 DIAGNOSIS — Z7901 Long term (current) use of anticoagulants: Secondary | ICD-10-CM | POA: Diagnosis not present

## 2019-05-30 NOTE — Progress Notes (Signed)
Pt INR 2.6 continue same med as prescribed

## 2019-05-31 DIAGNOSIS — I5033 Acute on chronic diastolic (congestive) heart failure: Secondary | ICD-10-CM | POA: Diagnosis not present

## 2019-05-31 DIAGNOSIS — N182 Chronic kidney disease, stage 2 (mild): Secondary | ICD-10-CM | POA: Diagnosis not present

## 2019-05-31 DIAGNOSIS — J449 Chronic obstructive pulmonary disease, unspecified: Secondary | ICD-10-CM | POA: Diagnosis not present

## 2019-05-31 DIAGNOSIS — E1122 Type 2 diabetes mellitus with diabetic chronic kidney disease: Secondary | ICD-10-CM | POA: Diagnosis not present

## 2019-05-31 DIAGNOSIS — I130 Hypertensive heart and chronic kidney disease with heart failure and stage 1 through stage 4 chronic kidney disease, or unspecified chronic kidney disease: Secondary | ICD-10-CM | POA: Diagnosis not present

## 2019-05-31 DIAGNOSIS — J9621 Acute and chronic respiratory failure with hypoxia: Secondary | ICD-10-CM | POA: Diagnosis not present

## 2019-06-01 ENCOUNTER — Telehealth: Payer: Self-pay

## 2019-06-01 NOTE — Telephone Encounter (Signed)
CONFIRMED VIRTUAL VISIT ON 06-07-19.

## 2019-06-06 ENCOUNTER — Other Ambulatory Visit: Payer: Self-pay

## 2019-06-06 ENCOUNTER — Other Ambulatory Visit: Payer: Self-pay | Admitting: Adult Health

## 2019-06-06 ENCOUNTER — Ambulatory Visit (INDEPENDENT_AMBULATORY_CARE_PROVIDER_SITE_OTHER): Payer: Medicare Other

## 2019-06-06 DIAGNOSIS — Z7901 Long term (current) use of anticoagulants: Secondary | ICD-10-CM | POA: Diagnosis not present

## 2019-06-06 NOTE — Progress Notes (Signed)
Pt inr 3.3 continue same med as prescribed

## 2019-06-07 ENCOUNTER — Other Ambulatory Visit: Payer: Self-pay

## 2019-06-07 ENCOUNTER — Encounter: Payer: Self-pay | Admitting: Internal Medicine

## 2019-06-07 ENCOUNTER — Ambulatory Visit (INDEPENDENT_AMBULATORY_CARE_PROVIDER_SITE_OTHER): Payer: Medicare Other | Admitting: Internal Medicine

## 2019-06-07 VITALS — BP 145/67 | HR 87 | Temp 97.1°F | Wt 182.8 lb

## 2019-06-07 DIAGNOSIS — IMO0002 Reserved for concepts with insufficient information to code with codable children: Secondary | ICD-10-CM

## 2019-06-07 DIAGNOSIS — E1129 Type 2 diabetes mellitus with other diabetic kidney complication: Secondary | ICD-10-CM

## 2019-06-07 DIAGNOSIS — N182 Chronic kidney disease, stage 2 (mild): Secondary | ICD-10-CM | POA: Diagnosis not present

## 2019-06-07 DIAGNOSIS — I130 Hypertensive heart and chronic kidney disease with heart failure and stage 1 through stage 4 chronic kidney disease, or unspecified chronic kidney disease: Secondary | ICD-10-CM | POA: Diagnosis not present

## 2019-06-07 DIAGNOSIS — E1122 Type 2 diabetes mellitus with diabetic chronic kidney disease: Secondary | ICD-10-CM | POA: Diagnosis not present

## 2019-06-07 DIAGNOSIS — I4891 Unspecified atrial fibrillation: Secondary | ICD-10-CM | POA: Diagnosis not present

## 2019-06-07 DIAGNOSIS — I5033 Acute on chronic diastolic (congestive) heart failure: Secondary | ICD-10-CM

## 2019-06-07 DIAGNOSIS — J449 Chronic obstructive pulmonary disease, unspecified: Secondary | ICD-10-CM | POA: Diagnosis not present

## 2019-06-07 DIAGNOSIS — J9621 Acute and chronic respiratory failure with hypoxia: Secondary | ICD-10-CM | POA: Diagnosis not present

## 2019-06-07 DIAGNOSIS — E1165 Type 2 diabetes mellitus with hyperglycemia: Secondary | ICD-10-CM

## 2019-06-07 NOTE — Progress Notes (Signed)
[facility] [place], [place] [address]  Internal MEDICINE  Telephone Visit  Patient Name: Kimberly Peterson  [id number]  [id number]  Date of Service: 06/07/2019  I connected with the patient at 1155 by telephone and verified the patients identity using two identifiers.   I discussed the limitations, risks, security and privacy concerns of performing an evaluation and management service by telephone and the availability of in person appointments. I also discussed with the patient that there may be a patient responsible charge related to the service.  The patient expressed understanding and agrees to proceed.    Chief Complaint  Patient presents with  . Diabetes    patients blood sugar ran high with ensure, sugar was 97 this morning  . Hypertension    HPI Pt is feeling well at home now. She has been hospitalized 3 times due to non adherence with her Bipap. She developed acute on chronic heart failure. Blood sugar is improved, she is on chronic anticoagulation for prosthetic heart valve. Her daughter lives with her   Has lost weight  Current Medication: Outpatient Encounter Medications as of 06/07/2019  Medication Sig  . aspirin 81 MG tablet Take 81 mg by mouth daily.  Marland Kitchen atorvastatin (LIPITOR) 10 MG tablet TAKE 1 TABLET BY MOUTH ONCE DAILY FOR CHOLESTEROL (Patient taking differently: Take 10 mg by mouth daily. )  . CARTIA XT 240 MG 24 hr capsule Take 1 capsule by mouth once daily (Patient taking differently: Take 240 mg by mouth daily. )  . hydrALAZINE (APRESOLINE) 25 MG tablet TAKE 1 & 1/2 (ONE & ONE-HALF) TABLETS BY MOUTH THREE TIMES DAILY FOR BLOOD PRESSURE (Patient taking differently: Take 37.5 mg by mouth 3 (three) times daily. )  . insulin aspart protamine- aspart (NOVOLOG MIX 70/30) (70-30) 100 UNIT/ML injection Inject 21-34 Units into the skin See admin instructions. Inject 32u under the skin every morning and inject 24u nder the skin every evening  .  pantoprazole (PROTONIX) 40 MG tablet Take one tab po qd for stomach (Patient taking differently: Take 40 mg by mouth daily. )  . potassium chloride SA (KLOR-CON) 20 MEQ tablet Take 20 mEq by mouth daily.  Marland Kitchen torsemide ([name]) 20 MG tablet Take 2 tablets (40 mg total) by mouth daily.  Marland Kitchen warfarin (COUMADIN) 5 MG tablet TAKE ONE TABLET BY MOUTH ONCE DAILY AT 5-6PM AND TAKE ONE AND ONE-HALF TABLET BY MOUTH ON THURSDAY (Patient taking differently: Take 5-7.5 mg by mouth See admin instructions. Take 1 tablet (5mg ) by mouth every Monday, Tuesday. Wednesday, Friday, Saturday and Sunday evening and take 1 tablets (7.5mg ) by mouth every Thursday evening)  . carvedilol (COREG) 6.25 MG tablet Take 1 tablet (6.25 mg total) by mouth 2 (two) times daily with a meal.   No facility-administered encounter medications on file as of 06/07/2019.     Surgical History: Past Surgical History:  Procedure Laterality Date  . aoric valve replacemet      [employer] Jude  . CATARACT EXTRACTION, BILATERAL    . PACEMAKER PLACEMENT    . VALVE REPLACEMENT      Medical History: Past Medical History:  Diagnosis Date  . CHF (congestive heart failure) ([place])   . Chronic kidney disease   . COPD (chronic obstructive pulmonary disease) ([place])   . Diabetes mellitus without complication ([place])   . Hypertension     Family History: History reviewed. No pertinent family history.  Social History   Socioeconomic History  . Marital status: Married  Spouse name: Not on file  . Number of children: Not on file  . Years of education: Not on file  . Highest education level: Not on file  Occupational History  . Not on file  Social Needs  . Financial resource strain: Not hard at all  . Food insecurity    Worry: Never true    Inability: Never true  . Transportation needs    Medical: No    Non-medical: No  Tobacco Use  . Smoking status: Never Smoker  . Smokeless tobacco: Never Used  Substance and Sexual Activity  . Alcohol use:  No  . Drug use: No  . Sexual activity: Never  Lifestyle  . Physical activity    Days per week: Not on file    Minutes per session: Not on file  . Stress: Not on file  Relationships  . Social Herbalist on phone: Not on file    Gets together: Not on file    Attends religious service: Not on file    Active member of club or organization: Not on file    Attends meetings of clubs or organizations: Not on file    Relationship status: Not on file  . Intimate partner violence    Fear of current or ex partner: Not on file    Emotionally abused: Not on file    Physically abused: Not on file    Forced sexual activity: Not on file  Other Topics Concern  . Not on file  Social History Narrative  . Not on file    Review of Systems  Constitutional: Negative for chills, diaphoresis and fatigue.  HENT: Negative for ear pain, postnasal drip and sinus pressure.   Eyes: Negative for photophobia, discharge, redness, itching and visual disturbance.  Respiratory: Negative for cough, shortness of breath and wheezing.   Cardiovascular: Negative for chest pain, palpitations and leg swelling.  Gastrointestinal: Negative for abdominal pain, constipation, diarrhea, nausea and vomiting.  Genitourinary: Negative for dysuria and flank pain.  Musculoskeletal: Negative for arthralgias, back pain, gait problem and neck pain.  Skin: Negative for color change.  Allergic/Immunologic: Negative for environmental allergies and food allergies.  Neurological: Negative for dizziness and headaches.  Hematological: Does not bruise/bleed easily.  Psychiatric/Behavioral: Negative for agitation, behavioral problems (depression) and hallucinations.    Vital Signs: BP (!) 145/67   Pulse 87   Temp (!) 97.1 F (36.2 C)   Wt 182 lb 12.8 oz (82.9 kg)   SpO2 92%   BMI 33.43 kg/m    Observation/Objective: Pt is in no acute distress, feels well    Assessment/Plan: 1. Diastolic CHF, acute on chronic  (HCC) - Stable at this time  2. Atrial fibrillation, unspecified type ([place]) - Heart rate is controlled, continue on anticoagulation   3. Type 2 diabetes, uncontrolled, with renal manifestation (HCC) - Improved.   General Counseling: Adelynne verbalizes understanding of the findings of today's phone visit and agrees with plan of treatment. I have discussed any further diagnostic evaluation that may be needed or ordered today. We also reviewed her medications today. she has been encouraged to call the office with any questions or concerns that should arise related to todays visit.   Time spent: [place] [facility]

## 2019-06-10 ENCOUNTER — Other Ambulatory Visit: Payer: Medicare Other

## 2019-06-10 LAB — ECHOCARDIOGRAM COMPLETE
Height: 62 in
Weight: 3128.77 oz

## 2019-06-13 ENCOUNTER — Other Ambulatory Visit: Payer: Self-pay

## 2019-06-13 ENCOUNTER — Ambulatory Visit (INDEPENDENT_AMBULATORY_CARE_PROVIDER_SITE_OTHER): Payer: Medicare Other

## 2019-06-13 ENCOUNTER — Ambulatory Visit: Payer: Self-pay | Admitting: Nurse Practitioner

## 2019-06-13 ENCOUNTER — Telehealth (HOSPITAL_COMMUNITY): Payer: Self-pay

## 2019-06-13 DIAGNOSIS — I4891 Unspecified atrial fibrillation: Secondary | ICD-10-CM | POA: Diagnosis not present

## 2019-06-13 NOTE — Telephone Encounter (Signed)
Left message with [name] for Shelicia Mcnair notifying her that Joellen Jersey will be following up with her upon her return to work. Message complete.

## 2019-06-13 NOTE — Progress Notes (Signed)
Pt INR 2.8 continue same med as prescribe

## 2019-06-14 ENCOUNTER — Other Ambulatory Visit: Payer: Medicare Other | Admitting: Adult Health Nurse Practitioner

## 2019-06-14 DIAGNOSIS — I5022 Chronic systolic (congestive) heart failure: Secondary | ICD-10-CM

## 2019-06-14 DIAGNOSIS — Z515 Encounter for palliative care: Secondary | ICD-10-CM | POA: Diagnosis not present

## 2019-06-14 NOTE — Progress Notes (Signed)
[facility] Consult Note Telephone: [phone]  Fax: [phone]  PATIENT NAME: Kimberly Peterson DOB: May 13, 1930 MRN: [id number]  PRIMARY CARE PROVIDER:   [name], MD  REFERRING PROVIDER:  [name], [place],  [place] [address]  RESPONSIBLE PARTY:   Self [phone]; Daughter Anne Hahn [phone]    RECOMMENDATIONS and PLAN:  1.  Advanced care planning.  Patient is a full code.No changes made today  2.  CHF.  Patient has no new concerns today. Does state that she gets more SOB when she goes out to an appointment.  Feels like it may be associated with anxiety related to not having enough oxygen when she goes out.  States he daughter makes sure she brings extra oxygen.  Have gone over some deep breathing exercises to help calm her when she does get anxious. Her BP is slightly elevated at 164/77 and her weight is up 4 pounds from 2 days ago. She states that her BP goes up and down and she does have some normal ones mixed elevated Bps in her VS log.  Her weight does seem to bounce around from 178-185.  Denies increased SOB or cough, fever, chest pain, N/V/D, constipation. Her edema seems about the same from last visit.  She  lives with her daughter who helps with ADLs and IADLs.  She is able to walk with walker.  Is continent of bowel and bladder.  She is currently on torsemide 40mg  daily and wears TED hose daily. She is being followed by Paramedic program. Continue current plan of care and follow up with Paramedic.  Overall patient is doing well at this time.  Appetite is good.  No reported falls, infections, or hospital visits since last visit.  Palliative care will continue to monitor for symptom management and decline and make recommendations as needed.  Will call after the holidays to set up next visit.  I spent 35 minutes providing this consultation,  from 11:00 to 11:35. More than 50% of the time in this  consultation was spent coordinating communication.   HISTORY OF PRESENT ILLNESS:  Kimberly Peterson is a 85 y.o. year old female with multiple medical problems including COPD, dependent on supplemental oxygen, chronic respiratory failure with hypoxia, congestive heart failure, atrial fibrillation, pulmonary embolism, complete heart block s / p pacemaker placement, chronic kidney disease, hypertension, diabetes, severe aortic valve stenosis, aortic valve replacement, cataract extraction bilaterally, lymphedema. Palliative Care was asked to help address goals of care.   CODE STATUS: full code  PPS: 50% HOSPICE ELIGIBILITY/DIAGNOSIS: TBD  PHYSICAL EXAM:  BP 164/77  HR 83  O2 93% on 2L  Weight 184.4 General: NAD, frail appearing Cardiovascular: regular rate and rhythm Pulmonary: lung sounds clear; normal respiratory effort Extremities: 1+ pitting edema noted to bilateral feet, no joint deformities Skin: no rashes Neurological: Weakness but otherwise nonfocal   PAST MEDICAL HISTORY:  Past Medical History:  Diagnosis Date  . CHF (congestive heart failure) ([place])   . Chronic kidney disease   . COPD (chronic obstructive pulmonary disease) ([place])   . Diabetes mellitus without complication ([place])   . Hypertension     SOCIAL HX:  Social History   Tobacco Use  . Smoking status: Never Smoker  . Smokeless tobacco: Never Used  Substance Use Topics  . Alcohol use: No    ALLERGIES: No Known Allergies   PERTINENT MEDICATIONS:  Outpatient Encounter Medications as of 06/14/2019  Medication Sig  .  aspirin 81 MG tablet Take 81 mg by mouth daily.  Marland Kitchen atorvastatin (LIPITOR) 10 MG tablet TAKE 1 TABLET BY MOUTH ONCE DAILY FOR CHOLESTEROL (Patient taking differently: Take 10 mg by mouth daily. )  . CARTIA XT 240 MG 24 hr capsule Take 1 capsule by mouth once daily (Patient taking differently: Take 240 mg by mouth daily. )  . carvedilol (COREG) 6.25 MG tablet Take 1 tablet (6.25 mg total) by mouth 2 (two)  times daily with a meal.  . hydrALAZINE (APRESOLINE) 25 MG tablet TAKE 1 1/2 TABLETS BY MOUTH THREE TIMES DAILY FOR BLOOD PRESSURE  . insulin aspart protamine- aspart (NOVOLOG MIX 70/30) (70-30) 100 UNIT/ML injection Inject 21-34 Units into the skin See admin instructions. Inject 32u under the skin every morning and inject 24u nder the skin every evening  . pantoprazole (PROTONIX) 40 MG tablet Take one tab po qd for stomach (Patient taking differently: Take 40 mg by mouth daily. )  . potassium chloride SA (KLOR-CON) 20 MEQ tablet Take 20 mEq by mouth daily.  Marland Kitchen torsemide ([name]) 20 MG tablet Take 2 tablets (40 mg total) by mouth daily.  Marland Kitchen warfarin (COUMADIN) 5 MG tablet TAKE ONE TABLET BY MOUTH ONCE DAILY AT 5-6PM AND TAKE ONE AND ONE-HALF TABLET BY MOUTH ON THURSDAY (Patient taking differently: Take 5-7.5 mg by mouth See admin instructions. Take 1 tablet (5mg ) by mouth every Monday, Tuesday. Wednesday, Friday, Saturday and Sunday evening and take 1 tablets (7.5mg ) by mouth every Thursday evening)   No facility-administered encounter medications on file as of 06/14/2019.      Amy Jenetta Downer, NP

## 2019-06-16 ENCOUNTER — Other Ambulatory Visit: Payer: Self-pay | Admitting: *Deleted

## 2019-06-16 NOTE — Patient Outreach (Signed)
[facility]) Care Management  06/16/2019  SHATERRIA SAGER [birth year] [phone]   Call placed to member to follow up on management of heart failure.  She report she is doing well, denies any shortness of breath or chest discomfort at this time.  Noted that she has had a virtual visit with PCP and a home visit from NP with palliative care since last outreach.  Denies any concerns discussed during visits.  Also noted that paramedicine has started, however she is not aware of follow up visit.  She will contact EMT to inquire about next visit.    Report having multiple teeth pulled prior to Thanksgiving, will have top and bottom dentures.  State her weights are remaining stable, today was 182 pounds, denies drastic weight increase.  Has follow up appointment with cardiology on 1/6.  Denies any urgent concerns at this time, agrees to follow up within the next month.  THN CM Care Plan Problem One     Most Recent Value  Care Plan Problem One  Risk for readmission related to heart failure management as evidenced by recent hospitalization  Role Documenting the Problem One  Care Management [place] for Problem One  Active  THN Long Term Goal   Member will not be readmitted to hospital within the next 31 days  THN Long Term Goal Start Date  05/03/19  [facility] Long Term Goal Met Date  06/16/19  [facility] CM Short Term Goal #1   Member will report compliance with paramedicine visits over the next 4 weeks  THN CM Short Term Goal #1 Start Date  04/21/19  Interventions for Short Term Goal #1  Advised to reach out to EMT to schedule next visit.  THN CM Short Term Goal #2   Member will keep and attend virtual appointment with cardiology within the next 4 weks   THN CM Short Term Goal #2 Start Date  06/16/19  Interventions for Short Term Goal #2  Educated member on importance of follow up with heart specialists in effort to effectively manage heart failure      Valente David, Therapist, sports, MSN [place] Manager [phone]

## 2019-06-20 ENCOUNTER — Other Ambulatory Visit: Payer: Self-pay

## 2019-06-20 ENCOUNTER — Ambulatory Visit: Payer: Medicare Other

## 2019-06-20 DIAGNOSIS — Z7901 Long term (current) use of anticoagulants: Secondary | ICD-10-CM | POA: Diagnosis not present

## 2019-06-20 DIAGNOSIS — I4821 Permanent atrial fibrillation: Secondary | ICD-10-CM | POA: Diagnosis not present

## 2019-06-22 ENCOUNTER — Other Ambulatory Visit (HOSPITAL_COMMUNITY): Payer: Self-pay

## 2019-06-22 NOTE — Progress Notes (Signed)
Paramedicine Encounter    Patient ID: Kimberly Peterson, female    DOB: 05-20-1930, 85 y.o.   MRN: [id number]   Patient Care Team: [name], MD as PCP - General (Internal Medicine) Valente David, RN as [place] Management [place], Connye Burkitt, LCSW as Education officer, museum (Licensed Holiday representative)  Patient Active Problem List   Diagnosis Date Noted  . Acute on chronic diastolic heart failure ([place]) 04/27/2019  . Acute and chronic respiratory failure with hypoxia ([place]) 03/01/2019  . Acute respiratory failure with hypoxia ([place]) 02/07/2019  . Type 2 diabetes with peripheral circulatory disorder, controlled ([place]) 10/17/2018  . Complete heart block ([place]) 09/09/2018  . Severe aortic valve stenosis 09/09/2018  . Lymphedema 08/12/2018  . Uncontrolled type 2 diabetes mellitus with hyperglycemia ([place]) 02/25/2018  . Atrial fibrillation ([place]) 02/25/2018  . Encounter for current long-term use of anticoagulants 02/25/2018  . Dependence on supplemental oxygen 02/25/2018  . Lower extremity edema 02/25/2018  . CHF (congestive heart failure) ([place]) 12/04/2015  . Diabetes mellitus ([place]) 12/04/2015  . Chronic obstructive pulmonary disease ([place]) 09/20/2015  . Diastolic CHF, acute on chronic (HCC) 09/19/2015  . Benign essential HTN 09/19/2015  . Controlled diabetes mellitus without complication, with long-term current use of insulin ([place]) 09/19/2015  . Hyperlipidemia, mixed 09/04/2014  . Pulmonary embolism ([place]) 09/23/2013    Current Outpatient Medications:  .  aspirin 81 MG tablet, Take 81 mg by mouth daily., Disp: , Rfl:  .  atorvastatin (LIPITOR) 10 MG tablet, TAKE 1 TABLET BY MOUTH ONCE DAILY FOR CHOLESTEROL (Patient taking differently: Take 10 mg by mouth daily. ), Disp: 90 tablet, Rfl: 3 .  CARTIA XT 240 MG 24 hr capsule, Take 1 capsule by mouth once daily (Patient taking differently: Take 240 mg by mouth daily. ), Disp: 90 capsule, Rfl: 1 .  hydrALAZINE (APRESOLINE) 25 MG tablet,  TAKE 1 1/2 TABLETS BY MOUTH THREE TIMES DAILY FOR BLOOD PRESSURE, Disp: 405 tablet, Rfl: 0 .  insulin aspart protamine- aspart (NOVOLOG MIX 70/30) (70-30) 100 UNIT/ML injection, Inject 21-34 Units into the skin See admin instructions. Inject 32u under the skin every morning and inject 24u nder the skin every evening, Disp: , Rfl:  .  pantoprazole (PROTONIX) 40 MG tablet, Take one tab po qd for stomach (Patient taking differently: Take 40 mg by mouth daily. ), Disp: 90 tablet, Rfl: 1 .  torsemide ([name]) 20 MG tablet, Take 2 tablets (40 mg total) by mouth daily., Disp: 60 tablet, Rfl: 0 .  warfarin (COUMADIN) 5 MG tablet, TAKE ONE TABLET BY MOUTH ONCE DAILY AT 5-6PM AND TAKE ONE AND ONE-HALF TABLET BY MOUTH ON THURSDAY (Patient taking differently: Take 5-7.5 mg by mouth See admin instructions. Take 1 tablet (5mg ) by mouth every Monday, Tuesday. Wednesday, Friday, Saturday and Sunday evening and take 1 tablets (7.5mg ) by mouth every Thursday evening), Disp: 90 tablet, Rfl: 4 .  carvedilol (COREG) 6.25 MG tablet, Take 1 tablet (6.25 mg total) by mouth 2 (two) times daily with a meal., Disp: 180 tablet, Rfl: 3 .  potassium chloride SA (KLOR-CON) 20 MEQ tablet, Take 20 mEq by mouth daily., Disp: , Rfl:  No Known Allergies    Social History   Socioeconomic History  . Marital status: Married    Spouse name: Not on file  . Number of children: Not on file  . Years of education: Not on file  . Highest education level: Not on file  Occupational History  . Not on  file  Tobacco Use  . Smoking status: Never Smoker  . Smokeless tobacco: Never Used  Substance and Sexual Activity  . Alcohol use: No  . Drug use: No  . Sexual activity: Never  Other Topics Concern  . Not on file  Social History Narrative  . Not on file   Social Determinants of Health   Financial Resource Strain: Low Risk   . Difficulty of Paying Living Expenses: Not hard at all  Food Insecurity: No Food Insecurity  . Worried  About Charity fundraiser in the Last Year: Never true  . Ran Out of Food in the Last Year: Never true  Transportation Needs: No Transportation Needs  . Lack of Transportation (Medical): No  . Lack of Transportation (Non-Medical): No  Physical Activity:   . Days of Exercise per Week: Not on file  . Minutes of Exercise per Session: Not on file  Stress:   . Feeling of Stress : Not on file  Social Connections:   . Frequency of Communication with Friends and Family: Not on file  . Frequency of Social Gatherings with Friends and Family: Not on file  . Attends Religious Services: Not on file  . Active Member of Clubs or Organizations: Not on file  . Attends Archivist Meetings: Not on file  . Marital Status: Not on file  Intimate Partner Violence:   . Fear of Current or Ex-Partner: Not on file  . Emotionally Abused: Not on file  . Physically Abused: Not on file  . Sexually Abused: Not on file    Physical Exam      Future Appointments  Date Time Provider [place]  07/13/2019  9:30 AM Alisa Graff, FNP [facility] None  07/25/2019 11:15 AM Ronnell Freshwater, NP [facility] None  08/15/2019 10:30 AM Allyne Gee, MD [facility] None    BP (!) 150/60   Pulse 82   Resp 18   Wt 184 lb (83.5 kg)   SpO2 94%   BMI 33.65 kg/m   Weight yesterday-182 Last visit weight-184  Pt reports she is doing well. She denies any issues or concerns at this time.  She reports NP from palliative came to see her last week. The home health agency is done with services now.  Looks like her weights fluctuate a lot between 179-184 this week-appears to be consistent with other providers as well.  Her b/p same--fluctuates from 120-180/60's.  She reports her breathing is doing well. She denies c/p, no dizziness.  Reviewed meds--no potassium is being taken although it is on her list.  Lives with daughter who does her pill dosing for her and her ADL's.  Her rt leg is always more swollen than  the left.  No abnormal swelling today.   Will see her again in 3-4 wks.   Kimberly Peterson, [place] [facility] Paramedic  06/22/19

## 2019-06-27 ENCOUNTER — Telehealth: Payer: Self-pay

## 2019-06-27 ENCOUNTER — Ambulatory Visit: Payer: Medicare Other

## 2019-06-27 ENCOUNTER — Other Ambulatory Visit: Payer: Self-pay

## 2019-06-27 NOTE — Telephone Encounter (Signed)
Pt/INR results came through fax out of prescribed range. Spoke with DFK advised patient to withhold medication for today, 06-27-19 and resume starting tomorrow 06-28-19 at one a day.

## 2019-07-04 ENCOUNTER — Ambulatory Visit (INDEPENDENT_AMBULATORY_CARE_PROVIDER_SITE_OTHER): Payer: Medicare Other

## 2019-07-04 ENCOUNTER — Other Ambulatory Visit: Payer: Self-pay

## 2019-07-04 DIAGNOSIS — Z7901 Long term (current) use of anticoagulants: Secondary | ICD-10-CM

## 2019-07-04 DIAGNOSIS — I4891 Unspecified atrial fibrillation: Secondary | ICD-10-CM

## 2019-07-04 NOTE — Progress Notes (Signed)
Pt INR  3.7 as per dr Humphrey Rolls continue same med  as prescribed

## 2019-07-11 ENCOUNTER — Emergency Department: Payer: Medicare Other

## 2019-07-11 ENCOUNTER — Telehealth: Payer: Self-pay

## 2019-07-11 ENCOUNTER — Other Ambulatory Visit: Payer: Self-pay

## 2019-07-11 ENCOUNTER — Inpatient Hospital Stay
Admission: EM | Admit: 2019-07-11 | Discharge: 2019-07-18 | DRG: 291 | Disposition: A | Discharge status: Home Health | Payer: Medicare Other | Attending: Internal Medicine | Admitting: Internal Medicine

## 2019-07-11 ENCOUNTER — Encounter: Payer: Self-pay | Admitting: *Deleted

## 2019-07-11 DIAGNOSIS — E872 Acidosis, unspecified: Secondary | ICD-10-CM | POA: Diagnosis present

## 2019-07-11 DIAGNOSIS — I4891 Unspecified atrial fibrillation: Secondary | ICD-10-CM

## 2019-07-11 DIAGNOSIS — E876 Hypokalemia: Secondary | ICD-10-CM | POA: Diagnosis present

## 2019-07-11 DIAGNOSIS — Z794 Long term (current) use of insulin: Secondary | ICD-10-CM | POA: Diagnosis not present

## 2019-07-11 DIAGNOSIS — E782 Mixed hyperlipidemia: Secondary | ICD-10-CM | POA: Diagnosis present

## 2019-07-11 DIAGNOSIS — N183 Chronic kidney disease, stage 3 unspecified: Secondary | ICD-10-CM | POA: Diagnosis not present

## 2019-07-11 DIAGNOSIS — N1831 Chronic kidney disease, stage 3a: Secondary | ICD-10-CM | POA: Diagnosis not present

## 2019-07-11 DIAGNOSIS — I340 Nonrheumatic mitral (valve) insufficiency: Secondary | ICD-10-CM | POA: Diagnosis present

## 2019-07-11 DIAGNOSIS — D509 Iron deficiency anemia, unspecified: Secondary | ICD-10-CM | POA: Diagnosis present

## 2019-07-11 DIAGNOSIS — I2699 Other pulmonary embolism without acute cor pulmonale: Secondary | ICD-10-CM | POA: Diagnosis present

## 2019-07-11 DIAGNOSIS — I5023 Acute on chronic systolic (congestive) heart failure: Secondary | ICD-10-CM | POA: Diagnosis present

## 2019-07-11 DIAGNOSIS — N179 Acute kidney failure, unspecified: Secondary | ICD-10-CM | POA: Diagnosis not present

## 2019-07-11 DIAGNOSIS — E1151 Type 2 diabetes mellitus with diabetic peripheral angiopathy without gangrene: Secondary | ICD-10-CM | POA: Diagnosis present

## 2019-07-11 DIAGNOSIS — I248 Other forms of acute ischemic heart disease: Secondary | ICD-10-CM | POA: Diagnosis present

## 2019-07-11 DIAGNOSIS — Z66 Do not resuscitate: Secondary | ICD-10-CM | POA: Diagnosis present

## 2019-07-11 DIAGNOSIS — I482 Chronic atrial fibrillation, unspecified: Secondary | ICD-10-CM | POA: Diagnosis present

## 2019-07-11 DIAGNOSIS — Z7982 Long term (current) use of aspirin: Secondary | ICD-10-CM | POA: Diagnosis not present

## 2019-07-11 DIAGNOSIS — Z515 Encounter for palliative care: Secondary | ICD-10-CM | POA: Diagnosis not present

## 2019-07-11 DIAGNOSIS — R0902 Hypoxemia: Secondary | ICD-10-CM | POA: Diagnosis not present

## 2019-07-11 DIAGNOSIS — Z7901 Long term (current) use of anticoagulants: Secondary | ICD-10-CM | POA: Diagnosis not present

## 2019-07-11 DIAGNOSIS — Z950 Presence of cardiac pacemaker: Secondary | ICD-10-CM

## 2019-07-11 DIAGNOSIS — K449 Diaphragmatic hernia without obstruction or gangrene: Secondary | ICD-10-CM | POA: Diagnosis present

## 2019-07-11 DIAGNOSIS — J449 Chronic obstructive pulmonary disease, unspecified: Secondary | ICD-10-CM | POA: Diagnosis present

## 2019-07-11 DIAGNOSIS — R140 Abdominal distension (gaseous): Secondary | ICD-10-CM | POA: Diagnosis not present

## 2019-07-11 DIAGNOSIS — K5900 Constipation, unspecified: Secondary | ICD-10-CM | POA: Diagnosis present

## 2019-07-11 DIAGNOSIS — E1122 Type 2 diabetes mellitus with diabetic chronic kidney disease: Secondary | ICD-10-CM | POA: Diagnosis present

## 2019-07-11 DIAGNOSIS — I442 Atrioventricular block, complete: Secondary | ICD-10-CM | POA: Diagnosis present

## 2019-07-11 DIAGNOSIS — K648 Other hemorrhoids: Secondary | ICD-10-CM | POA: Diagnosis not present

## 2019-07-11 DIAGNOSIS — Z20822 Contact with and (suspected) exposure to covid-19: Secondary | ICD-10-CM | POA: Diagnosis present

## 2019-07-11 DIAGNOSIS — D508 Other iron deficiency anemias: Secondary | ICD-10-CM | POA: Diagnosis not present

## 2019-07-11 DIAGNOSIS — D500 Iron deficiency anemia secondary to blood loss (chronic): Secondary | ICD-10-CM | POA: Diagnosis not present

## 2019-07-11 DIAGNOSIS — J80 Acute respiratory distress syndrome: Secondary | ICD-10-CM | POA: Diagnosis not present

## 2019-07-11 DIAGNOSIS — K635 Polyp of colon: Secondary | ICD-10-CM | POA: Diagnosis present

## 2019-07-11 DIAGNOSIS — J9621 Acute and chronic respiratory failure with hypoxia: Secondary | ICD-10-CM | POA: Diagnosis not present

## 2019-07-11 DIAGNOSIS — R0602 Shortness of breath: Secondary | ICD-10-CM | POA: Diagnosis not present

## 2019-07-11 DIAGNOSIS — D62 Acute posthemorrhagic anemia: Secondary | ICD-10-CM | POA: Diagnosis present

## 2019-07-11 DIAGNOSIS — K644 Residual hemorrhoidal skin tags: Secondary | ICD-10-CM | POA: Diagnosis present

## 2019-07-11 DIAGNOSIS — I5033 Acute on chronic diastolic (congestive) heart failure: Secondary | ICD-10-CM | POA: Diagnosis not present

## 2019-07-11 DIAGNOSIS — I10 Essential (primary) hypertension: Secondary | ICD-10-CM | POA: Diagnosis not present

## 2019-07-11 DIAGNOSIS — I2782 Chronic pulmonary embolism: Secondary | ICD-10-CM | POA: Diagnosis present

## 2019-07-11 DIAGNOSIS — Z79899 Other long term (current) drug therapy: Secondary | ICD-10-CM | POA: Diagnosis not present

## 2019-07-11 DIAGNOSIS — I5031 Acute diastolic (congestive) heart failure: Secondary | ICD-10-CM | POA: Diagnosis not present

## 2019-07-11 DIAGNOSIS — I130 Hypertensive heart and chronic kidney disease with heart failure and stage 1 through stage 4 chronic kidney disease, or unspecified chronic kidney disease: Principal | ICD-10-CM | POA: Diagnosis present

## 2019-07-11 DIAGNOSIS — E1129 Type 2 diabetes mellitus with other diabetic kidney complication: Secondary | ICD-10-CM | POA: Diagnosis present

## 2019-07-11 DIAGNOSIS — R0689 Other abnormalities of breathing: Secondary | ICD-10-CM | POA: Diagnosis not present

## 2019-07-11 DIAGNOSIS — K5730 Diverticulosis of large intestine without perforation or abscess without bleeding: Secondary | ICD-10-CM | POA: Diagnosis not present

## 2019-07-11 DIAGNOSIS — I459 Conduction disorder, unspecified: Secondary | ICD-10-CM | POA: Diagnosis present

## 2019-07-11 DIAGNOSIS — K5790 Diverticulosis of intestine, part unspecified, without perforation or abscess without bleeding: Secondary | ICD-10-CM | POA: Diagnosis not present

## 2019-07-11 DIAGNOSIS — R778 Other specified abnormalities of plasma proteins: Secondary | ICD-10-CM | POA: Diagnosis not present

## 2019-07-11 DIAGNOSIS — Z7189 Other specified counseling: Secondary | ICD-10-CM | POA: Diagnosis not present

## 2019-07-11 DIAGNOSIS — IMO0002 Reserved for concepts with insufficient information to code with codable children: Secondary | ICD-10-CM | POA: Diagnosis present

## 2019-07-11 DIAGNOSIS — Z952 Presence of prosthetic heart valve: Secondary | ICD-10-CM | POA: Diagnosis not present

## 2019-07-11 DIAGNOSIS — I509 Heart failure, unspecified: Secondary | ICD-10-CM | POA: Diagnosis not present

## 2019-07-11 DIAGNOSIS — K219 Gastro-esophageal reflux disease without esophagitis: Secondary | ICD-10-CM | POA: Diagnosis present

## 2019-07-11 HISTORY — DX: Anemia, unspecified: D64.9

## 2019-07-11 LAB — LACTIC ACID, PLASMA
Lactic Acid, Venous: 3.5 mmol/L (ref 0.5–1.9)
Lactic Acid, Venous: 1.4 mmol/L (ref 0.5–1.9)
Lactic Acid, Venous: 1.6 mmol/L (ref 0.5–1.9)

## 2019-07-11 LAB — URINALYSIS, COMPLETE (UACMP) WITH MICROSCOPIC
Bacteria, UA: NONE SEEN
Bilirubin Urine: NEGATIVE
Glucose, UA: NEGATIVE mg/dL
Hgb urine dipstick: NEGATIVE
Ketones, ur: NEGATIVE mg/dL
Leukocytes,Ua: NEGATIVE
Nitrite: NEGATIVE
Protein, ur: 100 mg/dL — AB
Specific Gravity, Urine: 1.013 (ref 1.005–1.030)
pH: 5 (ref 5.0–8.0)

## 2019-07-11 LAB — CBC WITH DIFFERENTIAL/PLATELET
Abs Immature Granulocytes: 0.04 10*3/uL (ref 0.00–0.07)
Basophils Absolute: 0.1 10*3/uL (ref 0.0–0.1)
Basophils Relative: 1 %
Eosinophils Absolute: 0.1 10*3/uL (ref 0.0–0.5)
Eosinophils Relative: 1 %
HCT: 27.2 % — ABNORMAL LOW (ref 36.0–46.0)
Hemoglobin: 7.7 g/dL — ABNORMAL LOW (ref 12.0–15.0)
Immature Granulocytes: 1 %
Lymphocytes Relative: 25 %
Lymphs Abs: 2.1 10*3/uL (ref 0.7–4.0)
MCH: 21.4 pg — ABNORMAL LOW (ref 26.0–34.0)
MCHC: 28.3 g/dL — ABNORMAL LOW (ref 30.0–36.0)
MCV: 75.8 fL — ABNORMAL LOW (ref 80.0–100.0)
Monocytes Absolute: 0.9 10*3/uL (ref 0.1–1.0)
Monocytes Relative: 10 %
Neutro Abs: 5.4 10*3/uL (ref 1.7–7.7)
Neutrophils Relative %: 62 %
Platelets: 402 10*3/uL — ABNORMAL HIGH (ref 150–400)
RBC: 3.59 MIL/uL — ABNORMAL LOW (ref 3.87–5.11)
RDW: 17.5 % — ABNORMAL HIGH (ref 11.5–15.5)
WBC: 8.5 10*3/uL (ref 4.0–10.5)
nRBC: 0.4 % — ABNORMAL HIGH (ref 0.0–0.2)

## 2019-07-11 LAB — BLOOD GAS, ARTERIAL
Acid-Base Excess: 6.9 mmol/L — ABNORMAL HIGH (ref 0.0–2.0)
Bicarbonate: 32.5 mmol/L — ABNORMAL HIGH (ref 20.0–28.0)
Delivery systems: POSITIVE
Expiratory PAP: 7
FIO2: 0.4
Inspiratory PAP: 14
O2 Saturation: 96.9 %
Patient temperature: 37
RATE: 12 resp/min
pCO2 arterial: 49 mmHg — ABNORMAL HIGH (ref 32.0–48.0)
pH, Arterial: 7.43 (ref 7.350–7.450)
pO2, Arterial: 87 mmHg (ref 83.0–108.0)

## 2019-07-11 LAB — GLUCOSE, CAPILLARY
Glucose-Capillary: 201 mg/dL — ABNORMAL HIGH (ref 70–99)
Glucose-Capillary: 190 mg/dL — ABNORMAL HIGH (ref 70–99)

## 2019-07-11 LAB — COMPREHENSIVE METABOLIC PANEL
ALT: 12 U/L (ref 0–44)
AST: 25 U/L (ref 15–41)
Albumin: 3.6 g/dL (ref 3.5–5.0)
Alkaline Phosphatase: 72 U/L (ref 38–126)
Anion gap: 15 (ref 5–15)
BUN: 25 mg/dL — ABNORMAL HIGH (ref 8–23)
CO2: 26 mmol/L (ref 22–32)
Calcium: 9 mg/dL (ref 8.9–10.3)
Chloride: 98 mmol/L (ref 98–111)
Creatinine, Ser: 1.4 mg/dL — ABNORMAL HIGH (ref 0.44–1.00)
GFR calc Af Amer: 39 mL/min — ABNORMAL LOW (ref >60)
GFR calc non Af Amer: 34 mL/min — ABNORMAL LOW (ref >60)
Glucose, Bld: 256 mg/dL — ABNORMAL HIGH (ref 70–99)
Potassium: 3.4 mmol/L — ABNORMAL LOW (ref 3.5–5.1)
Sodium: 139 mmol/L (ref 135–145)
Total Bilirubin: 0.8 mg/dL (ref 0.3–1.2)
Total Protein: 8.4 g/dL — ABNORMAL HIGH (ref 6.5–8.1)

## 2019-07-11 LAB — RESPIRATORY PANEL BY RT PCR (FLU A&B, COVID)
Influenza A by PCR: NEGATIVE
Influenza B by PCR: NEGATIVE
SARS Coronavirus 2 by RT PCR: NEGATIVE

## 2019-07-11 LAB — MRSA PCR SCREENING: MRSA by PCR: NEGATIVE

## 2019-07-11 LAB — TROPONIN I (HIGH SENSITIVITY)
Troponin I (High Sensitivity): 54 ng/L — ABNORMAL HIGH (ref <18)
Troponin I (High Sensitivity): 165 ng/L (ref <18)

## 2019-07-11 LAB — PROTIME-INR
INR: 3.8 — ABNORMAL HIGH (ref 0.8–1.2)
Prothrombin Time: 37.3 seconds — ABNORMAL HIGH (ref 11.4–15.2)

## 2019-07-11 LAB — BRAIN NATRIURETIC PEPTIDE: B Natriuretic Peptide: 735 pg/mL — ABNORMAL HIGH (ref 0.0–100.0)

## 2019-07-11 MED ORDER — ACETAMINOPHEN 325 MG PO TABS
650.0000 mg | ORAL_TABLET | Freq: Four times a day (QID) | ORAL | Status: DC | PRN
  Administered 2019-07-18: 650 mg via ORAL
  Filled 2019-07-11: qty 2

## 2019-07-11 MED ORDER — DM-GUAIFENESIN ER 30-600 MG PO TB12
1.0000 | ORAL_TABLET | Freq: Two times a day (BID) | ORAL | Status: DC
  Administered 2019-07-11 – 2019-07-18 (×14): 1 via ORAL
  Filled 2019-07-11 (×18): qty 1

## 2019-07-11 MED ORDER — SODIUM CHLORIDE 0.9 % IV SOLN: 250.0000 mL | INTRAVENOUS | Status: DC | PRN

## 2019-07-11 MED ORDER — INSULIN ASPART PROT & ASPART (70-30 MIX) 100 UNIT/ML ~~LOC~~ SUSP: 16.0000 [IU] | SUBCUTANEOUS | Status: DC

## 2019-07-11 MED ORDER — CHLORHEXIDINE GLUCONATE 0.12 % MT SOLN
15.0000 mL | Freq: Two times a day (BID) | OROMUCOSAL | Status: DC
  Administered 2019-07-11: 15 mL via OROMUCOSAL
  Filled 2019-07-11: qty 15

## 2019-07-11 MED ORDER — HYDRALAZINE HCL 25 MG PO TABS: 25.0000 mg | ORAL_TABLET | Freq: Three times a day (TID) | ORAL | Status: DC | PRN

## 2019-07-11 MED ORDER — ASPIRIN EC 81 MG PO TBEC
81.0000 mg | DELAYED_RELEASE_TABLET | Freq: Every day | ORAL | Status: DC
  Administered 2019-07-12 – 2019-07-14 (×3): 81 mg via ORAL
  Filled 2019-07-11 (×3): qty 1

## 2019-07-11 MED ORDER — CHLORHEXIDINE GLUCONATE CLOTH 2 % EX PADS
6.0000 | MEDICATED_PAD | Freq: Every day | CUTANEOUS | Status: DC
  Administered 2019-07-11 – 2019-07-16 (×4): 6 via TOPICAL

## 2019-07-11 MED ORDER — WARFARIN - PHARMACIST DOSING INPATIENT
Freq: Every day | Status: DC
  Filled 2019-07-11: qty 1

## 2019-07-11 MED ORDER — SODIUM CHLORIDE 0.9% FLUSH
3.0000 mL | INTRAVENOUS | Status: DC | PRN
  Administered 2019-07-16 – 2019-07-17 (×2): 3 mL via INTRAVENOUS

## 2019-07-11 MED ORDER — ORAL CARE MOUTH RINSE: 15.0000 mL | Freq: Two times a day (BID) | OROMUCOSAL | Status: DC

## 2019-07-11 MED ORDER — HYDRALAZINE HCL 25 MG PO TABS
37.5000 mg | ORAL_TABLET | Freq: Three times a day (TID) | ORAL | Status: DC
  Administered 2019-07-11 – 2019-07-15 (×8): 37.5 mg via ORAL
  Administered 2019-07-16: 25 mg via ORAL
  Administered 2019-07-16 – 2019-07-18 (×4): 37.5 mg via ORAL
  Filled 2019-07-11 (×2): qty 2
  Filled 2019-07-11: qty 1.5
  Filled 2019-07-11 (×2): qty 2
  Filled 2019-07-11 (×3): qty 1.5
  Filled 2019-07-11 (×8): qty 2

## 2019-07-11 MED ORDER — SODIUM CHLORIDE 0.9% FLUSH
3.0000 mL | Freq: Two times a day (BID) | INTRAVENOUS | Status: DC
  Administered 2019-07-11 – 2019-07-17 (×12): 3 mL via INTRAVENOUS

## 2019-07-11 MED ORDER — PANTOPRAZOLE SODIUM 40 MG PO TBEC
40.0000 mg | DELAYED_RELEASE_TABLET | Freq: Every day | ORAL | Status: DC
  Administered 2019-07-12 – 2019-07-18 (×7): 40 mg via ORAL
  Filled 2019-07-11 (×7): qty 1

## 2019-07-11 MED ORDER — CARVEDILOL 3.125 MG PO TABS
6.2500 mg | ORAL_TABLET | Freq: Two times a day (BID) | ORAL | Status: DC
  Administered 2019-07-12 – 2019-07-17 (×10): 6.25 mg via ORAL
  Filled 2019-07-11: qty 1
  Filled 2019-07-11: qty 2
  Filled 2019-07-11: qty 1
  Filled 2019-07-11 (×7): qty 2

## 2019-07-11 MED ORDER — FUROSEMIDE 10 MG/ML IJ SOLN
80.0000 mg | Freq: Once | INTRAMUSCULAR | Status: AC
  Administered 2019-07-11: 80 mg via INTRAVENOUS
  Filled 2019-07-11: qty 8

## 2019-07-11 MED ORDER — ATORVASTATIN CALCIUM 10 MG PO TABS
10.0000 mg | ORAL_TABLET | Freq: Every day | ORAL | Status: DC
  Administered 2019-07-12 – 2019-07-18 (×7): 10 mg via ORAL
  Filled 2019-07-11 (×7): qty 1

## 2019-07-11 MED ORDER — ONDANSETRON HCL 4 MG/2ML IJ SOLN: 4.0000 mg | Freq: Three times a day (TID) | INTRAMUSCULAR | Status: DC | PRN

## 2019-07-11 MED ORDER — FUROSEMIDE 10 MG/ML IJ SOLN
80.0000 mg | Freq: Two times a day (BID) | INTRAMUSCULAR | Status: DC
  Administered 2019-07-12 – 2019-07-14 (×6): 80 mg via INTRAVENOUS
  Filled 2019-07-11 (×7): qty 8

## 2019-07-11 MED ORDER — IPRATROPIUM BROMIDE HFA 17 MCG/ACT IN AERS
2.0000 | INHALATION_SPRAY | RESPIRATORY_TRACT | Status: DC
  Administered 2019-07-11 – 2019-07-18 (×30): 2 via RESPIRATORY_TRACT
  Filled 2019-07-11: qty 12.9

## 2019-07-11 MED ORDER — INSULIN ASPART PROT & ASPART (70-30 MIX) 100 UNIT/ML ~~LOC~~ SUSP
16.0000 [IU] | Freq: Every day | SUBCUTANEOUS | Status: DC
  Administered 2019-07-11 – 2019-07-15 (×5): 16 [IU] via SUBCUTANEOUS
  Filled 2019-07-11 (×4): qty 10

## 2019-07-11 MED ORDER — ALBUTEROL SULFATE HFA 108 (90 BASE) MCG/ACT IN AERS
2.0000 | INHALATION_SPRAY | RESPIRATORY_TRACT | Status: DC | PRN
  Administered 2019-07-11 – 2019-07-18 (×5): 2 via RESPIRATORY_TRACT
  Filled 2019-07-11: qty 6.7

## 2019-07-11 MED ORDER — INSULIN ASPART 100 UNIT/ML ~~LOC~~ SOLN
0.0000 [IU] | SUBCUTANEOUS | Status: DC
  Administered 2019-07-11: 2 [IU] via SUBCUTANEOUS
  Administered 2019-07-11: 3 [IU] via SUBCUTANEOUS
  Administered 2019-07-12: 5 [IU] via SUBCUTANEOUS
  Administered 2019-07-12: 1 [IU] via SUBCUTANEOUS
  Administered 2019-07-12 – 2019-07-13 (×2): 5 [IU] via SUBCUTANEOUS
  Administered 2019-07-13: 2 [IU] via SUBCUTANEOUS
  Filled 2019-07-11 (×6): qty 1

## 2019-07-11 MED ORDER — POTASSIUM CHLORIDE CRYS ER 20 MEQ PO TBCR
40.0000 meq | EXTENDED_RELEASE_TABLET | Freq: Once | ORAL | Status: AC
  Administered 2019-07-11: 40 meq via ORAL
  Filled 2019-07-11: qty 2

## 2019-07-11 MED ORDER — INSULIN ASPART PROT & ASPART (70-30 MIX) 100 UNIT/ML ~~LOC~~ SUSP
20.0000 [IU] | Freq: Every day | SUBCUTANEOUS | Status: DC
  Administered 2019-07-12 – 2019-07-15 (×4): 20 [IU] via SUBCUTANEOUS
  Filled 2019-07-11 (×4): qty 10

## 2019-07-11 MED ORDER — DILTIAZEM HCL ER COATED BEADS 240 MG PO CP24
240.0000 mg | ORAL_CAPSULE | Freq: Every day | ORAL | Status: DC
  Administered 2019-07-12 – 2019-07-18 (×7): 240 mg via ORAL
  Filled 2019-07-11 (×7): qty 1

## 2019-07-11 NOTE — ED Notes (Signed)
Report given to ICU, daughter [name] notified that pt will be transferred to ICU

## 2019-07-11 NOTE — ED Notes (Signed)
Notified pharmacy for mucinex and atrovent

## 2019-07-11 NOTE — Progress Notes (Signed)
[place] for warfarin Indication: atrial fibrillation and pulmonary embolus  No Known Allergies   Vital Signs: Temp: 98.2 F (36.8 C) (01/04 1526) Temp Source: Oral (01/04 1526) BP: 181/64 (01/04 1500) Pulse Rate: 84 (01/04 1500)  Labs: Recent Labs    07/11/19 1241  HGB 7.7*  HCT 27.2*  PLT 402*  LABPROT 37.3*  INR 3.8*  CREATININE 1.40*  TROPONINIHS 54*    CrCl cannot be calculated (Unknown ideal weight.).   Medical History: Past Medical History:  Diagnosis Date  . CHF (congestive heart failure) ([place])   . Chronic kidney disease   . COPD (chronic obstructive pulmonary disease) ([place])   . Diabetes mellitus without complication ([place])   . Hypertension     Assessment: 86 year old female with PMH afib and PE on warfarin PTA. Home dose of warfarin 5 mg daily except 7.5 mg on Thursday. Pharmacy consulted to continue warfarin in this patient.  Date INR Dose 1/4 3.8 --  Goal of Therapy:  INR 2-3 Monitor platelets by anticoagulation protocol: Yes   Plan:  INR 3.8 supratherapeutic. Will hold dose of warfarin tonight and monitor INR trend for appropriate dosing. INR with morning labs. Will follow CBC q72h at minimum.  Tawnya Crook, PharmD 07/11/2019,4:21 PM

## 2019-07-11 NOTE — H&P (Signed)
History and Physical    Kimberly Peterson [id number] DOB: Sep 19, 1929 DOA: 07/11/2019  Referring MD/NP/PA:   PCP: [name], MD   Patient coming from:  The patient is coming from home.  At baseline, pt is independent for most of ADL.        Chief Complaint: SOB  HPI: Kimberly Peterson is a 86 y.o. female with medical history significant of hypertension, hyperlipidemia, diabetes mellitus, GERD, CKD stage III, CHF, atrial fibrillation PE on Coumadin, aortic valve replacement, pacemaker placement due to complete heart block, who presents with shortness of breath.  Pt has been having shortness of breath for more than 1 day, which has been progressively worsening.  Patient is in respiratory distress. Her oxygen saturation 92% on 4 L oxygen, requiring BiPAP in the ED. She has some chest congestion cough and no active chest pain.  No active nausea, vomiting, diarrhea.  Patient moves all extremities.  ED Course: pt was found to have BNP 735, troponin 54, INR 3.8, lactic acid 3.5, negative COVID-19 RVP and Ag tests, worsening renal function, potassium 3.4, blood pressure 184/74, heart rate in 90s, tachypnea, chest x-ray showed vascular congestion and cardiomegaly.  Patient is admitted to stepdown as inpatient.    Review of Systems:   General: no fevers, chills, no body weight gain, has poor appetite, has fatigue HEENT: no blurry vision, hearing changes or sore throat Respiratory: has dyspnea, coughing, no wheezing CV: no chest pain, no palpitations GI: no nausea, vomiting, abdominal pain, diarrhea, constipation GU: no dysuria, burning on urination, increased urinary frequency, hematuria  Ext: has leg edema Neuro: no unilateral weakness, numbness, or tingling, no vision change or hearing loss Skin: no rash, no skin tear. MSK: No muscle spasm, no deformity, no limitation of range of movement in spin Heme: No easy bruising.  Travel history: No recent long distant travel.  Allergy: No Known  Allergies  Past Medical History:  Diagnosis Date  . CHF (congestive heart failure) ([place])   . Chronic kidney disease   . COPD (chronic obstructive pulmonary disease) ([place])   . Diabetes mellitus without complication ([place])   . Hypertension     Past Surgical History:  Procedure Laterality Date  . aoric valve replacemet      st Jude  . CATARACT EXTRACTION, BILATERAL    . PACEMAKER PLACEMENT    . VALVE REPLACEMENT      Social History:  reports that she has never smoked. She has never used smokeless tobacco. She reports that she does not drink alcohol or use drugs.  Family History: No family history on file.  Tried to review with patient, but patient cannot provide clear medical history.  Prior to Admission medications   Medication Sig Start Date End Date Taking? Authorizing Provider  aspirin 81 MG tablet Take 81 mg by mouth daily.    [provider]  atorvastatin (LIPITOR) 10 MG tablet TAKE 1 TABLET BY MOUTH ONCE DAILY FOR CHOLESTEROL Patient taking differently: Take 10 mg by mouth daily.  12/06/18   [place], Audie Clear, NP  CARTIA XT 240 MG 24 hr capsule Take 1 capsule by mouth once daily Patient taking differently: Take 240 mg by mouth daily.  04/06/19   [name], MD  carvedilol (COREG) 6.25 MG tablet Take 1 tablet (6.25 mg total) by mouth 2 (two) times daily with a meal. 03/22/19 05/11/19  [name], MD  hydrALAZINE (APRESOLINE) 25 MG tablet TAKE 1 1/2 TABLETS BY MOUTH THREE TIMES DAILY  FOR BLOOD PRESSURE 06/08/19   [name], MD  insulin aspart protamine- aspart (NOVOLOG MIX 70/30) (70-30) 100 UNIT/ML injection Inject 21-34 Units into the skin See admin instructions. Inject 32u under the skin every morning and inject 24u nder the skin every evening    [provider]  pantoprazole (PROTONIX) 40 MG tablet Take one tab po qd for stomach Patient taking differently: Take 40 mg by mouth daily.  03/22/19   [name], MD  potassium chloride SA (KLOR-CON) 20 MEQ  tablet Take 20 mEq by mouth daily.    [provider]  torsemide ([name]) 20 MG tablet Take 2 tablets (40 mg total) by mouth daily. 05/01/19   Hillary Bow, MD  warfarin (COUMADIN) 5 MG tablet TAKE ONE TABLET BY MOUTH ONCE DAILY AT 5-6PM AND TAKE ONE AND ONE-HALF TABLET BY MOUTH ON THURSDAY Patient taking differently: Take 5-7.5 mg by mouth See admin instructions. Take 1 tablet (5mg ) by mouth every Monday, Tuesday. Wednesday, Friday, Saturday and Sunday evening and take 1 tablets (7.5mg ) by mouth every Thursday evening 02/22/19   Ronnell Freshwater, NP    Physical Exam: Vitals:   07/11/19 1526 07/11/19 1530 07/11/19 1600 07/11/19 1630  BP:  (!) 153/59 (!) 132/55 (!) 151/48  Pulse:  77 69 72  Resp:  19 (!) 30 17  Temp: 98.2 F (36.8 C)     TempSrc: Oral     SpO2:  95% 95% 95%   General: Not in acute distress HEENT:       Eyes: PERRL, EOMI, no scleral icterus.       ENT: No discharge from the ears and nose, no pharynx injection, no tonsillar enlargement.        Neck: no bruit, no mass felt. Heme: No neck lymph node enlargement. Cardiac: S1/S2, RRR, No gallops or rubs. Respiratory: No rales, wheezing, rhonchi or rubs. GI: Soft, nondistended, nontender, no rebound pain, no organomegaly, BS present. GU: No hematuria Ext: has 2+ pitting leg edema bilaterally. 2+DP/PT pulse bilaterally. Musculoskeletal: No joint deformities, No joint redness or warmth, no limitation of ROM in spin. Skin: No rashes.  Neuro: Alert, oriented X3, cranial nerves II-XII grossly intact, moves all extremities normally.   Psych: Patient is not psychotic, no suicidal or hemocidal ideation.  Labs on Admission: I have personally reviewed following labs and imaging studies  CBC: Recent Labs  Lab 07/11/19 1241  WBC 8.5  NEUTROABS 5.4  HGB 7.7*  HCT 27.2*  MCV 75.8*  PLT [id number]*   Basic Metabolic Panel: Recent Labs  Lab 07/11/19 1241  NA 139  K 3.4*  CL 98  CO2 26  GLUCOSE 256*  BUN 25*    CREATININE 1.40*  CALCIUM 9.0   GFR: CrCl cannot be calculated (Unknown ideal weight.). Liver Function Tests: Recent Labs  Lab 07/11/19 1241  AST 25  ALT 12  ALKPHOS 72  BILITOT 0.8  PROT 8.4*  ALBUMIN 3.6   No results for input(s): LIPASE, AMYLASE in the last 168 hours. No results for input(s): AMMONIA in the last 168 hours. Coagulation Profile: Recent Labs  Lab 07/11/19 1241  INR 3.8*   Cardiac Enzymes: No results for input(s): CKTOTAL, CKMB, CKMBINDEX, TROPONINI in the last 168 hours. BNP (last 3 results) No results for input(s): PROBNP in the last 8760 hours. HbA1C: No results for input(s): HGBA1C in the last 72 hours. CBG: Recent Labs  Lab 07/11/19 1647  GLUCAP 201*   Lipid Profile: No results for input(s): CHOL,  HDL, LDLCALC, TRIG, CHOLHDL, LDLDIRECT in the last 72 hours. Thyroid Function Tests: No results for input(s): TSH, T4TOTAL, FREET4, T3FREE, THYROIDAB in the last 72 hours. Anemia Panel: No results for input(s): VITAMINB12, FOLATE, FERRITIN, TIBC, IRON, RETICCTPCT in the last 72 hours. Urine analysis:    Component Value Date/Time   COLORURINE YELLOW (A) 07/11/2019 1242   APPEARANCEUR CLEAR (A) 07/11/2019 1242   APPEARANCEUR Clear 06/09/2018 1502   LABSPEC 1.013 07/11/2019 1242   LABSPEC 1.012 09/07/2013 1418   PHURINE 5.0 07/11/2019 1242   GLUCOSEU NEGATIVE 07/11/2019 1242   GLUCOSEU Negative 09/07/2013 1418   HGBUR NEGATIVE 07/11/2019 1242   BILIRUBINUR NEGATIVE 07/11/2019 1242   BILIRUBINUR Negative 06/09/2018 1502   BILIRUBINUR Negative 09/07/2013 1418   KETONESUR NEGATIVE 07/11/2019 1242   PROTEINUR 100 (A) 07/11/2019 1242   UROBILINOGEN 0.2 11/21/2013 1253   NITRITE NEGATIVE 07/11/2019 1242   LEUKOCYTESUR NEGATIVE 07/11/2019 1242   LEUKOCYTESUR 3+ 09/07/2013 1418   Sepsis Labs: @LABRCNTIP (procalcitonin:4,lacticidven:4) ) Recent Results (from the past 240 hour(s))  Respiratory Panel by RT PCR (Flu A&B, Covid) - Nasopharyngeal Swab      Status: None   Collection Time: 07/11/19  1:07 PM   Specimen: Nasopharyngeal Swab  Result Value Ref Range Status   SARS Coronavirus 2 by RT PCR NEGATIVE NEGATIVE Final    Comment: (NOTE) SARS-CoV-2 target nucleic acids are NOT DETECTED. The SARS-CoV-2 RNA is generally detectable in upper respiratoy specimens during the acute phase of infection. The lowest concentration of SARS-CoV-2 viral copies this assay can detect is 131 copies/mL. A negative result does not preclude SARS-Cov-2 infection and should not be used as the sole basis for treatment or other patient management decisions. A negative result may occur with  improper specimen collection/handling, submission of specimen other than nasopharyngeal swab, presence of viral mutation(s) within the areas targeted by this assay, and inadequate number of viral copies (<131 copies/mL). A negative result must be combined with clinical observations, patient history, and epidemiological information. The expected result is Negative. Fact Sheet for Patients:  PinkCheek.be Fact Sheet for Healthcare Providers:  GravelBags.it This test is not yet ap proved or cleared by the [place] FDA and  has been authorized for detection and/or diagnosis of SARS-CoV-2 by FDA under an Emergency Use Authorization (EUA). This EUA will remain  in effect (meaning this test can be used) for the duration of the COVID-19 declaration under Section 564(b)(1) of the Act, 21 U.S.C. section 360bbb-3(b)(1), unless the authorization is terminated or revoked sooner.    Influenza A by PCR NEGATIVE NEGATIVE Final   Influenza B by PCR NEGATIVE NEGATIVE Final    Comment: (NOTE) The Xpert Xpress SARS-CoV-2/FLU/RSV assay is intended as an aid in  the diagnosis of influenza from Nasopharyngeal swab specimens and  should not be used as a sole basis for treatment. Nasal washings and  aspirates are unacceptable  for Xpert Xpress SARS-CoV-2/FLU/RSV  testing. Fact Sheet for Patients: PinkCheek.be Fact Sheet for Healthcare Providers: GravelBags.it This test is not yet approved or cleared by the [place] FDA and  has been authorized for detection and/or diagnosis of SARS-CoV-2 by  FDA under an Emergency Use Authorization (EUA). This EUA will remain  in effect (meaning this test can be used) for the duration of the  Covid-19 declaration under Section 564(b)(1) of the Act, 21  U.S.C. section 360bbb-3(b)(1), unless the authorization is  terminated or revoked. Performed at [facility], [address] [place], [place] [address]  Radiological Exams on Admission: DG Chest Portable 1 View  Result Date: 07/11/2019 CLINICAL DATA:  Worsening shortness of breath over the past 24 hours. EXAM: PORTABLE CHEST 1 VIEW COMPARISON:  Single-view of the chest 04/27/2019, 03/01/2019 and 12/03/2015. FINDINGS: Cardiomegaly and pulmonary vascular congestion are unchanged. Atherosclerosis is noted. Pacing device is in place. No acute or focal bony abnormality. IMPRESSION: 1. No change in cardiomegaly and pulmonary vascular congestion. 2. Atherosclerosis. Electronically Signed   By: Inge Rise M.D.   On: 07/11/2019 13:31     EKG: Independently reviewed.  Poor quality of EKG strips, QTC 486, RAD, anteroseptal infarction pattern, poor R wave progression   Assessment/Plan Principal Problem:   Acute on chronic diastolic heart failure (HCC) Active Problems:   Pulmonary embolism (HCC)   Benign essential HTN   Chronic obstructive pulmonary disease (HCC)   Hyperlipidemia, mixed   Atrial fibrillation (HCC)   Acute and chronic respiratory failure with hypoxia (HCC)   Type II diabetes mellitus with renal manifestations (HCC)   Microcytic anemia   Lactic acidosis   Hypokalemia   Elevated troponin   Acute renal failure superimposed on stage 3a  chronic kidney disease (HCC)  Acute and chronic respiratory failure with hypoxia due to acute on chronic diastolic heart failure and elevated trop: Patient has elevated BNP 735, 2+ leg edema, chest x-ray showed vascular congestion cardiomegaly, clinically consistent with CHF exacerbation.  Patient has a mildly elevated troponin 54, no active chest pain.  Most likely due to demand ischemia secondary to CHF exacerbation.  -will admit to SDU as inpt. -on BiPAP -Lasix 80 mg bid by IV -trend trop, check A1c and FLP -2d echo -Daily weights -strict I/O's -Low salt diet -Fluid restriction  Pulmonary embolism ([place]): -continue coumadin per pharm  HLD: -lipitor  HTN:  -Continue home medications -hydralazine prn  Chronic obstructive pulmonary disease ([place]): -Continue bronchodilators  Atrial fibrillation ([place]): -on coumadin -coreg  Type II diabetes mellitus with renal manifestations ([place]): Last A1c 6.1, well controled. Patient is taking 70/30 insulin at home -will decrease 70/30 insulin from 32-24 units twice daily to 20-16 unit twice daily -SSI  Microcytic anemia: Hemoglobin 8.2 to  7.7 -Follow-up with CBC  Lactic acidosis: Lactic acid 3.5, does not have signs of infection, possibly due to hypoxia -Will not give IV fluids due to CHF exacerbation -Trend lactic acid  Hypokalemia: K= 3.4 on admission. - Repleted - Check Mg level  Acute renal failure superimposed on stage 3a chronic kidney disease ([place]): Baseline creatinine 1.0-1.02, her creatinine is 1.40, BUN 25, possibly due to cardiorenal syndrome. -Follow-up renal function by BMP     DVT ppx: on couamind Code Status: Full code Family Communication: None at bed side.    Disposition Plan:  Anticipate discharge back to previous home environment Consults called:  none Admission status: SDU/inpation       Date of Service 07/11/2019    Ivor Costa [facility]   If 7PM-7AM, please contact  night-coverage [email or web address] Password [email or web address] 07/11/2019, 5:18 PM

## 2019-07-11 NOTE — Telephone Encounter (Signed)
Pt daughter called  Pt feels fluid in lungs ,bloated and shortness of breath as per dr Humphrey Rolls advised pt need call 911 or go to ED

## 2019-07-11 NOTE — ED Notes (Signed)
Date and time results received: 07/11/19 1330   Test: Lactic Acid Critical Value: 3.5  Name of Provider Notified: Charlynn Court

## 2019-07-11 NOTE — ED Provider Notes (Signed)
[facility] Emergency Department Provider Note ____________________________________________   First MD Initiated Contact with Patient 07/11/19 1229     (approximate)  I have reviewed the triage vital signs and the nursing notes.   HISTORY  Chief Complaint Shortness of Breath  Level 5 caveat: History present illness limited due to respiratory distress  HPI Kimberly Peterson is a 86 y.o. female with a history of CHF, COPD, and other PMH as noted below who presents with acute onset shortness of breath since last night, worsening today, and not relieved by her oxygen at home.  O2 saturation was found to be as low as the mid 70s by EMS, who placed her on CPAP.  Past Medical History:  Diagnosis Date  . CHF (congestive heart failure) ([place])   . Chronic kidney disease   . COPD (chronic obstructive pulmonary disease) ([place])   . Diabetes mellitus without complication ([place])   . Hypertension     Patient Active Problem List   Diagnosis Date Noted  . Type II diabetes mellitus with renal manifestations ([place]) 07/11/2019  . Microcytic anemia 07/11/2019  . Lactic acidosis 07/11/2019  . Hypokalemia 07/11/2019  . Elevated troponin 07/11/2019  . Acute renal failure superimposed on stage 3a chronic kidney disease ([place]) 07/11/2019  . Acute on chronic systolic CHF (congestive heart failure) ([place]) 07/11/2019  . Acute on chronic diastolic heart failure ([place]) 04/27/2019  . Acute and chronic respiratory failure with hypoxia ([place]) 03/01/2019  . Acute respiratory failure with hypoxia ([place]) 02/07/2019  . Type 2 diabetes with peripheral circulatory disorder, controlled ([place]) 10/17/2018  . Complete heart block ([place]) 09/09/2018  . Severe aortic valve stenosis 09/09/2018  . Lymphedema 08/12/2018  . Uncontrolled type 2 diabetes mellitus with hyperglycemia ([place]) 02/25/2018  . Atrial fibrillation ([place]) 02/25/2018  . Encounter for current long-term use of anticoagulants 02/25/2018  .  Dependence on supplemental oxygen 02/25/2018  . Lower extremity edema 02/25/2018  . CHF (congestive heart failure) ([place]) 12/04/2015  . Diabetes mellitus ([place]) 12/04/2015  . Chronic obstructive pulmonary disease ([place]) 09/20/2015  . Diastolic CHF, acute on chronic (HCC) 09/19/2015  . Benign essential HTN 09/19/2015  . Controlled diabetes mellitus without complication, with long-term current use of insulin ([place]) 09/19/2015  . Hyperlipidemia, mixed 09/04/2014  . Pulmonary embolism ([place]) 09/23/2013    Past Surgical History:  Procedure Laterality Date  . aoric valve replacemet      [employer]  . CATARACT EXTRACTION, BILATERAL    . PACEMAKER PLACEMENT    . VALVE REPLACEMENT      Prior to Admission medications   Medication Sig Start Date End Date Taking? Authorizing Provider  aspirin 81 MG tablet Take 81 mg by mouth daily.    [provider]  atorvastatin (LIPITOR) 10 MG tablet TAKE 1 TABLET BY MOUTH ONCE DAILY FOR CHOLESTEROL Patient taking differently: Take 10 mg by mouth daily.  12/06/18   [place], Audie Clear, NP  CARTIA XT 240 MG 24 hr capsule Take 1 capsule by mouth once daily Patient taking differently: Take 240 mg by mouth daily.  04/06/19   [name], MD  carvedilol (COREG) 6.25 MG tablet Take 1 tablet (6.25 mg total) by mouth 2 (two) times daily with a meal. 03/22/19 05/11/19  [name], MD  hydrALAZINE (APRESOLINE) 25 MG tablet TAKE 1 1/2 TABLETS BY MOUTH THREE TIMES DAILY FOR BLOOD PRESSURE 06/08/19   [name], MD  insulin aspart protamine- aspart (NOVOLOG MIX 70/30) (70-30) 100 UNIT/ML injection  Inject 21-34 Units into the skin See admin instructions. Inject 32u under the skin every morning and inject 24u nder the skin every evening    [provider]  pantoprazole (PROTONIX) 40 MG tablet Take one tab po qd for stomach Patient taking differently: Take 40 mg by mouth daily.  03/22/19   [name], MD  potassium chloride SA (KLOR-CON) 20 MEQ tablet Take 20 mEq  by mouth daily.    [provider]  torsemide ([name]) 20 MG tablet Take 2 tablets (40 mg total) by mouth daily. 05/01/19   Hillary Bow, MD  warfarin (COUMADIN) 5 MG tablet TAKE ONE TABLET BY MOUTH ONCE DAILY AT 5-6PM AND TAKE ONE AND ONE-HALF TABLET BY MOUTH ON THURSDAY Patient taking differently: Take 5-7.5 mg by mouth See admin instructions. Take 1 tablet (5mg ) by mouth every Monday, Tuesday. Wednesday, Friday, Saturday and Sunday evening and take 1 tablets (7.5mg ) by mouth every Thursday evening 02/22/19   Ronnell Freshwater, NP    Allergies Patient has no known allergies.  No family history on file.  Social History Social History   Tobacco Use  . Smoking status: Never Smoker  . Smokeless tobacco: Never Used  Substance Use Topics  . Alcohol use: No  . Drug use: No    Review of Systems Level 5 caveat: Unable to obtain review of systems due to respiratory distress    ____________________________________________   PHYSICAL EXAM:  VITAL SIGNS: ED Triage Vitals  Enc Vitals Group     BP 07/11/19 1236 (!) 185/76     Pulse Rate 07/11/19 1236 89     Resp 07/11/19 1236 (!) 31     Temp --      Temp src --      SpO2 07/11/19 1236 98 %     Weight --      Height --      Head Circumference --      Peak Flow --      Pain Score 07/11/19 1257 0     Pain Loc --      Pain Edu? --      Excl. in [employer]? --     Constitutional: Alert and oriented, uncomfortable appearing. Eyes: Conjunctivae are normal.  Head: Atraumatic. Nose: No congestion/rhinnorhea. Mouth/Throat: Mucous membranes are moist.   Neck: Normal range of motion.  Cardiovascular: Normal rate, regular rhythm.  Good peripheral circulation. Respiratory: Increased respiratory effort with retractions.  Decreased breath sounds bilaterally.  Possible faint rales to bases. Gastrointestinal: Soft and nontender. No distention.  Genitourinary: No flank tenderness. Musculoskeletal: 2+ bilateral lower extremity edema.   Extremities warm and well perfused.  Neurologic: Motor intact in all extremities. Skin:  Skin is warm and dry. No rash noted. Psychiatric: Calm and cooperative.  ____________________________________________   LABS (all labs ordered are listed, but only abnormal results are displayed)  Labs Reviewed  COMPREHENSIVE METABOLIC PANEL - Abnormal; Notable for the following components:      Result Value   Potassium 3.4 (*)    Glucose, Bld 256 (*)    BUN 25 (*)    Creatinine, Ser 1.40 (*)    Total Protein 8.4 (*)    GFR calc non Af Amer 34 (*)    GFR calc Af Amer 39 (*)    All other components within normal limits  BRAIN NATRIURETIC PEPTIDE - Abnormal; Notable for the following components:   B Natriuretic Peptide 735.0 (*)    All other components within normal limits  LACTIC ACID, PLASMA - Abnormal; Notable for the following components:   Lactic Acid, Venous 3.5 (*)    All other components within normal limits  CBC WITH DIFFERENTIAL/PLATELET - Abnormal; Notable for the following components:   RBC 3.59 (*)    Hemoglobin 7.7 (*)    HCT 27.2 (*)    MCV 75.8 (*)    MCH 21.4 (*)    MCHC 28.3 (*)    RDW 17.5 (*)    Platelets 402 (*)    nRBC 0.4 (*)    All other components within normal limits  PROTIME-INR - Abnormal; Notable for the following components:   Prothrombin Time 37.3 (*)    INR 3.8 (*)    All other components within normal limits  BLOOD GAS, ARTERIAL - Abnormal; Notable for the following components:   pCO2 arterial 49 (*)    Bicarbonate 32.5 (*)    Acid-Base Excess 6.9 (*)    All other components within normal limits  TROPONIN I (HIGH SENSITIVITY) - Abnormal; Notable for the following components:   Troponin I (High Sensitivity) 54 (*)    All other components within normal limits  RESPIRATORY PANEL BY RT PCR (FLU A&B, COVID)  LACTIC ACID, PLASMA  URINALYSIS, COMPLETE (UACMP) WITH MICROSCOPIC  POC SARS CORONAVIRUS 2 AG -  ED  TROPONIN I (HIGH SENSITIVITY)    ____________________________________________  EKG  ED ECG REPORT I, Arta Silence, the attending physician, personally viewed and interpreted this ECG.  Date: 07/11/2019 EKG Time: 1236 Rate: 93 Rhythm: normal sinus rhythm QRS Axis: normal Intervals: Nonspecific IVCD ST/T Wave abnormalities: Nonspecific abnormalities but unable to interpret due to very poor baseline Narrative Interpretation: Poor quality EKG with possible nonspecific ST abnormalities but no ischemic changes   ED ECG REPORT I, Arta Silence, the attending physician, personally viewed and interpreted this ECG.  Date: 07/11/2019 EKG Time: 1304 Rate: 83 Rhythm: normal sinus rhythm QRS Axis: normal Intervals: Nonspecific IVCD ST/T Wave abnormalities: normal Narrative Interpretation: Nonspecific abnormalities with no evidence of acute ischemia  ____________________________________________  RADIOLOGY  CXR: Cardiomegaly and pulmonary vascular congestion  ____________________________________________   PROCEDURES  Procedure(s) performed: No  Procedures  Critical Care performed: Yes  CRITICAL CARE Performed by: Arta Silence   Total critical care time: 45 minutes  Critical care time was exclusive of separately billable procedures and treating other patients.  Critical care was necessary to treat or prevent imminent or life-threatening deterioration.  Critical care was time spent personally by me on the following activities: development of treatment plan with patient and/or surrogate as well as nursing, discussions with consultants, evaluation of patient's response to treatment, examination of patient, obtaining history from patient or surrogate, ordering and performing treatments and interventions, ordering and review of laboratory studies, ordering and review of radiographic studies, pulse oximetry and re-evaluation of patient's  condition. ____________________________________________   INITIAL IMPRESSION / ASSESSMENT AND PLAN / ED COURSE  Pertinent labs & imaging results that were available during my care of the patient were reviewed by me and considered in my medical decision making (see chart for details).  86 year old female with PMH as noted above including history of CHF and COPD presents with relatively acute onset of shortness of breath since last night, and was found to be hypoxic by EMS today to the mid 70s.  The patient is normally on 2 L of oxygen by nasal cannula.  She was placed on CPAP by EMS.  I reviewed the past medical records in Epic patient was most recent  admitted in October with acute pulmonary edema due to CHF and respiratory failure requiring BiPAP at that time.  On exam today, the patient has significantly increased work of breathing and respiratory distress.  She is alert and able to answer questions.  O2 saturation is now 99% on BiPAP with FiO2 40%.  Patient has faint and decreased breath sounds, with possible rales at the bases.  The remainder of the exam is as described above.  Overall presentation is most consistent with acute CHF.  We will continue BiPAP, obtain chest x-ray and lab work-up, COVID-19 test, and reassess.  I anticipate admission.  ----------------------------------------- 3:28 PM on 07/11/2019 -----------------------------------------  Work-up is consistent with CHF exacerbation except for elevated lactate.  We attempted to wean the patient off of the BiPAP.  She maintained a good O2 saturation in the mid 90s on nasal cannula, but had worsening work of breathing requiring her to be put back on the BiPAP.  She will require admission, likely to stepdown.  I discussed her case with Dr. Blaine Hamper for admission to the hospital service. ___________________________  Delrae Sawyers was evaluated in Emergency Department on 07/11/2019 for the symptoms described in the history of present  illness. She was evaluated in the context of the global COVID-19 pandemic, which necessitated consideration that the patient might be at risk for infection with the SARS-CoV-2 virus that causes COVID-19. Institutional protocols and algorithms that pertain to the evaluation of patients at risk for COVID-19 are in a state of rapid change based on information released by regulatory bodies including the CDC and federal and state organizations. These policies and algorithms were followed during the patient's care in the ED.  ____________________________________________   FINAL CLINICAL IMPRESSION(S) / ED DIAGNOSES  Final diagnoses:  Acute on chronic respiratory failure with hypoxia (HCC)      NEW MEDICATIONS STARTED DURING THIS VISIT:  New Prescriptions   No medications on file     Note:  This document was prepared using Dragon voice recognition software and may include unintentional dictation errors.    Arta Silence, MD 07/11/19 [phone]

## 2019-07-11 NOTE — ED Triage Notes (Signed)
Pt brought in by ems for sob  Which has been increasing over the past 24 hours.  Per ems pt has notable increased abdominal distention over the past "few days".  Pt was placed on CPAP by ems and was 100% on CPAP on arrival.  Pt placed on Bipap on arrival.  Per ems pt has been compliant with all meds.  Checked COVID Ag and pt was negative.  EDP notified.

## 2019-07-11 NOTE — ED Notes (Signed)
Call to RT regarding ABG

## 2019-07-11 NOTE — Progress Notes (Signed)
Shift summary:  - Patient admitted to CCU on BiPAP.   - Lab called with critical troponin level. Dr. Blaine Hamper notified over telephone. No new orders.

## 2019-07-11 NOTE — ED Notes (Signed)
Pt placed on purewick to collect urine sample, also placed chuck under pt and changed pt into hospital gown.  Repositioned pt for comfort.  RT removed pt from Bipap and placed pt on 4L [place].  Pt spo2 remained between 92-94%.  Pt reports that her "breathing doesn't seem right" so I increased her [place] to 5L for her comfort. Pt spo2 is 96-98% on 5L.

## 2019-07-11 NOTE — ED Notes (Signed)
Placed pt back on Bipap per Dr. Charlynn Court

## 2019-07-12 ENCOUNTER — Inpatient Hospital Stay
Admit: 2019-07-12 | Discharge: 2019-07-12 | Disposition: A | Discharge status: Home / Self Care | Payer: Medicare Other | Attending: Internal Medicine | Admitting: Internal Medicine

## 2019-07-12 DIAGNOSIS — J9621 Acute and chronic respiratory failure with hypoxia: Secondary | ICD-10-CM

## 2019-07-12 LAB — LIPID PANEL
Cholesterol: 125 mg/dL (ref 0–200)
HDL: 51 mg/dL (ref >40)
LDL Cholesterol: 59 mg/dL (ref 0–99)
Total CHOL/HDL Ratio: 2.5 RATIO
Triglycerides: 73 mg/dL (ref <150)
VLDL: 15 mg/dL (ref 0–40)

## 2019-07-12 LAB — PROTIME-INR
INR: 3.8 — ABNORMAL HIGH (ref 0.8–1.2)
Prothrombin Time: 37.6 seconds — ABNORMAL HIGH (ref 11.4–15.2)

## 2019-07-12 LAB — GLUCOSE, CAPILLARY
Glucose-Capillary: 111 mg/dL — ABNORMAL HIGH (ref 70–99)
Glucose-Capillary: 111 mg/dL — ABNORMAL HIGH (ref 70–99)
Glucose-Capillary: 121 mg/dL — ABNORMAL HIGH (ref 70–99)
Glucose-Capillary: 158 mg/dL — ABNORMAL HIGH (ref 70–99)
Glucose-Capillary: 257 mg/dL — ABNORMAL HIGH (ref 70–99)
Glucose-Capillary: 257 mg/dL — ABNORMAL HIGH (ref 70–99)
Glucose-Capillary: 69 mg/dL — ABNORMAL LOW (ref 70–99)

## 2019-07-12 LAB — MAGNESIUM: Magnesium: 2.3 mg/dL (ref 1.7–2.4)

## 2019-07-12 LAB — BASIC METABOLIC PANEL
Anion gap: 12 (ref 5–15)
BUN: 26 mg/dL — ABNORMAL HIGH (ref 8–23)
CO2: 30 mmol/L (ref 22–32)
Calcium: 9.1 mg/dL (ref 8.9–10.3)
Chloride: 102 mmol/L (ref 98–111)
Creatinine, Ser: 1.21 mg/dL — ABNORMAL HIGH (ref 0.44–1.00)
GFR calc Af Amer: 47 mL/min — ABNORMAL LOW (ref >60)
GFR calc non Af Amer: 40 mL/min — ABNORMAL LOW (ref >60)
Glucose, Bld: 126 mg/dL — ABNORMAL HIGH (ref 70–99)
Potassium: 4 mmol/L (ref 3.5–5.1)
Sodium: 144 mmol/L (ref 135–145)

## 2019-07-12 LAB — HEMOGLOBIN A1C
Hgb A1c MFr Bld: 6 % — ABNORMAL HIGH (ref 4.8–5.6)
Hgb A1c MFr Bld: 5.9 % — ABNORMAL HIGH (ref 4.8–5.6)
Mean Plasma Glucose: 125.5 mg/dL
Mean Plasma Glucose: 122.63 mg/dL

## 2019-07-12 LAB — ECHOCARDIOGRAM COMPLETE
Height: 62 in
Weight: 3072.33 oz

## 2019-07-12 MED ORDER — SENNOSIDES-DOCUSATE SODIUM 8.6-50 MG PO TABS
1.0000 | ORAL_TABLET | Freq: Every day | ORAL | Status: DC
  Administered 2019-07-12 – 2019-07-17 (×5): 1 via ORAL
  Filled 2019-07-12 (×6): qty 1

## 2019-07-12 MED ORDER — POTASSIUM CHLORIDE CRYS ER 10 MEQ PO TBCR
10.0000 meq | EXTENDED_RELEASE_TABLET | Freq: Every day | ORAL | Status: DC
  Administered 2019-07-13 – 2019-07-18 (×6): 10 meq via ORAL
  Filled 2019-07-12 (×6): qty 1

## 2019-07-12 MED ORDER — BISACODYL 10 MG RE SUPP
10.0000 mg | Freq: Every day | RECTAL | Status: DC | PRN
  Administered 2019-07-12: 10 mg via RECTAL
  Filled 2019-07-12: qty 1

## 2019-07-12 MED ORDER — DOCUSATE SODIUM 100 MG PO CAPS
100.0000 mg | ORAL_CAPSULE | Freq: Every day | ORAL | Status: DC
  Administered 2019-07-12 – 2019-07-18 (×6): 100 mg via ORAL
  Filled 2019-07-12 (×6): qty 1

## 2019-07-12 MED ORDER — FERROUS SULFATE 325 (65 FE) MG PO TABS
325.0000 mg | ORAL_TABLET | Freq: Two times a day (BID) | ORAL | Status: DC
  Administered 2019-07-13 – 2019-07-16 (×7): 325 mg via ORAL
  Filled 2019-07-12 (×7): qty 1

## 2019-07-12 MED ORDER — MAGNESIUM HYDROXIDE 400 MG/5ML PO SUSP
30.0000 mL | Freq: Every day | ORAL | Status: DC | PRN
  Administered 2019-07-12: 30 mL via ORAL
  Filled 2019-07-12: qty 30

## 2019-07-12 MED ORDER — ORAL CARE MOUTH RINSE
15.0000 mL | Freq: Two times a day (BID) | OROMUCOSAL | Status: DC
  Administered 2019-07-12 – 2019-07-17 (×10): 15 mL via OROMUCOSAL

## 2019-07-12 NOTE — Progress Notes (Signed)
PROGRESS NOTE    Kimberly Peterson  [id number] DOB: 1929-08-18 DOA: 07/11/2019 PCP: [name], MD      Brief Narrative:  Kimberly Peterson is a 86 y.o. F with obesity, dCHF, DM, CKD III, Afib on warfarin, AV replacement, pacer due to CHB and HTN who presented with dyspnea on exertion.  Had had several days progressive DOE.  In the ER, she was in respiratory distress requiring BiPAP to maintain O2 saturation >90%.  Noted to have elevated BNP, lactic acidosis, severe range hypertension.  Started on Lasix and admitted.        Assessment & Plan:  Acute on chronic respiratory failure with hypoxia Acute on chronic diastolic CHF Presented with leg swelling, bilateral opacities on chest x-ray, and elevated BNP.  EF is 60 to 65% on echocardiogram today.    She is still swollen, but her breathing is much improved with diuresis. -Continue furosemide 40 mg IV twice a day  -K supplement -Strict I/Os, daily weights, telemetry  -Daily monitoring renal function     History of pulmonary embolism -Continue warfarin  History of atrial fibrillation, chronic -Continue carvedilol, diltiazem -Continue warfarin  Hypertension Improved -Continue carvedilol, diltiazem, hydralazine  COPD without exacerbation No wheezing -Continue duo nebs -Continue pantoprazole  Diabetes Glucoses well controlled -Continue baby aspirin, atorvastatin -Continue 70/30 -Continue sliding scale corrections  Lactic acidosis Due to CHF, no suspicion for infection.  Microcytic anemia No clinical bleeding, but hemoglobin is trending down. -Check iron studies -Start iron -Outpatient GI consultation  Hypokalemia -Supplement potassium with diuresis   AKI on CKD stage IIIa Baseline creatinine 1.0.  Creatinine on admission is 1.4.  This is to 1.2. -Continue diuresis          Disposition: The patient was admitted with acute CHF.   I will discharge when her swelling is resolved, and she has been  evaluated by physical therapy and placement can be arranged.        MDM: The below labs and imaging reports were reviewed and summarized above.  Medication management as above.  This was a severe exacerbation of her chronic disease.   DVT prophylaxis: Not applicable, on warfarin Code Status: Full code Family Communication:     Consultants:     Procedures:   1/5 echocardiogram--normal EF  Antimicrobials:       Subjective: Patient is feeling much better.  She is quite constipated.  No vomiting, fever, sputum, hemoptysis.  Objective: Vitals:   07/12/19 1900 07/12/19 2000 07/12/19 2019 07/12/19 2100  BP: 118/80 (!) 57/18 117/66   Pulse: 77 (!) 49 78 76  Resp: 16 20 (!) 22 17  Temp:  98.3 F (36.8 C)    TempSrc:  Oral    SpO2: 96% 100% 96% 97%  Weight:      Height:        Intake/Output Summary (Last 24 hours) at 07/12/2019 2140 Last data filed at 07/12/2019 1800 Gross per 24 hour  Intake 720 ml  Output 2750 ml  Net -2030 ml   Filed Weights   07/11/19 1850 07/12/19 0500  Weight: 88.6 kg 87.1 kg    Examination: General appearance: Morbidly obese elderly adult female, alert and in no acute distress.  Sitting on the commode HEENT: Anicteric, conjunctiva pink, lids and lashes normal. No nasal deformity, discharge, epistaxis.  Lips moist.   Skin: Warm and dry.   No suspicious rashes or lesions. Cardiac: RRR, nl S1-S2, no murmurs appreciated.  Capillary refill is brisk.  JVP not  visible.  1+ bilateral LE edema.  Radial pulses 2+ and symmetric. Respiratory: Normal respiratory rate and rhythm.  CTAB without  wheezes.  Diminished at bases, rales at bilateral bases. Abdomen: Abdomen soft.  No TTP or guarding. No ascites, distension, hepatosplenomegaly.   MSK: No deformities or effusions. Neuro: Awake and alert.  EOMI, moves all extremities. Speech fluent.    Psych: Sensorium intact and responding to questions, attention normal. Affect blunted.  Judgment and insight  appear mildly impaired due to dementia.    Data Reviewed: I have personally reviewed following labs and imaging studies:  CBC: Recent Labs  Lab 07/11/19 1241  WBC 8.5  NEUTROABS 5.4  HGB 7.7*  HCT 27.2*  MCV 75.8*  PLT [id number]*   Basic Metabolic Panel: Recent Labs  Lab 07/11/19 1241 07/12/19 0502  NA 139 144  K 3.4* 4.0  CL 98 102  CO2 26 30  GLUCOSE 256* 126*  BUN 25* 26*  CREATININE 1.40* 1.21*  CALCIUM 9.0 9.1  MG  --  2.3   GFR: Estimated Creatinine Clearance: 33.6 mL/min (A) (by C-G formula based on SCr of 1.21 mg/dL (H)). Liver Function Tests: Recent Labs  Lab 07/11/19 1241  AST 25  ALT 12  ALKPHOS 72  BILITOT 0.8  PROT 8.4*  ALBUMIN 3.6   No results for input(s): LIPASE, AMYLASE in the last 168 hours. No results for input(s): AMMONIA in the last 168 hours. Coagulation Profile: Recent Labs  Lab 07/11/19 1241 07/12/19 0502  INR 3.8* 3.8*   Cardiac Enzymes: No results for input(s): CKTOTAL, CKMB, CKMBINDEX, TROPONINI in the last 168 hours. BNP (last 3 results) No results for input(s): PROBNP in the last 8760 hours. HbA1C: Recent Labs    07/11/19 1654 07/12/19 0502  HGBA1C 6.0* 5.9*   CBG: Recent Labs  Lab 07/12/19 0404 07/12/19 0810 07/12/19 1146 07/12/19 1627 07/12/19 1912  GLUCAP 111* 121* 111* 257* 257*   Lipid Profile: Recent Labs    07/12/19 0502  CHOL 125  HDL 51  LDLCALC 59  TRIG 73  CHOLHDL 2.5   Thyroid Function Tests: No results for input(s): TSH, T4TOTAL, FREET4, T3FREE, THYROIDAB in the last 72 hours. Anemia Panel: No results for input(s): VITAMINB12, FOLATE, FERRITIN, TIBC, IRON, RETICCTPCT in the last 72 hours. Urine analysis:    Component Value Date/Time   COLORURINE YELLOW (A) 07/11/2019 1242   APPEARANCEUR CLEAR (A) 07/11/2019 1242   APPEARANCEUR Clear 06/09/2018 1502   LABSPEC 1.013 07/11/2019 1242   LABSPEC 1.012 09/07/2013 1418   PHURINE 5.0 07/11/2019 1242   GLUCOSEU NEGATIVE 07/11/2019 1242    GLUCOSEU Negative 09/07/2013 1418   HGBUR NEGATIVE 07/11/2019 1242   BILIRUBINUR NEGATIVE 07/11/2019 1242   BILIRUBINUR Negative 06/09/2018 1502   BILIRUBINUR Negative 09/07/2013 1418   KETONESUR NEGATIVE 07/11/2019 1242   PROTEINUR 100 (A) 07/11/2019 1242   UROBILINOGEN 0.2 11/21/2013 1253   NITRITE NEGATIVE 07/11/2019 1242   LEUKOCYTESUR NEGATIVE 07/11/2019 1242   LEUKOCYTESUR 3+ 09/07/2013 1418   Sepsis Labs: @LABRCNTIP (procalcitonin:4,lacticacidven:4)  ) Recent Results (from the past 240 hour(s))  Respiratory Panel by RT PCR (Flu A&B, Covid) - Nasopharyngeal Swab     Status: None   Collection Time: 07/11/19  1:07 PM   Specimen: Nasopharyngeal Swab  Result Value Ref Range Status   SARS Coronavirus 2 by RT PCR NEGATIVE NEGATIVE Final    Comment: (NOTE) SARS-CoV-2 target nucleic acids are NOT DETECTED. The SARS-CoV-2 RNA is generally detectable in upper respiratoy specimens during the acute phase of  infection. The lowest concentration of SARS-CoV-2 viral copies this assay can detect is 131 copies/mL. A negative result does not preclude SARS-Cov-2 infection and should not be used as the sole basis for treatment or other patient management decisions. A negative result may occur with  improper specimen collection/handling, submission of specimen other than nasopharyngeal swab, presence of viral mutation(s) within the areas targeted by this assay, and inadequate number of viral copies (<131 copies/mL). A negative result must be combined with clinical observations, patient history, and epidemiological information. The expected result is Negative. Fact Sheet for Patients:  PinkCheek.be Fact Sheet for Healthcare Providers:  GravelBags.it This test is not yet ap proved or cleared by the [place] FDA and  has been authorized for detection and/or diagnosis of SARS-CoV-2 by FDA under an Emergency Use Authorization (EUA).  This EUA will remain  in effect (meaning this test can be used) for the duration of the COVID-19 declaration under Section 564(b)(1) of the Act, 21 U.S.C. section 360bbb-3(b)(1), unless the authorization is terminated or revoked sooner.    Influenza A by PCR NEGATIVE NEGATIVE Final   Influenza B by PCR NEGATIVE NEGATIVE Final    Comment: (NOTE) The Xpert Xpress SARS-CoV-2/FLU/RSV assay is intended as an aid in  the diagnosis of influenza from Nasopharyngeal swab specimens and  should not be used as a sole basis for treatment. Nasal washings and  aspirates are unacceptable for Xpert Xpress SARS-CoV-2/FLU/RSV  testing. Fact Sheet for Patients: PinkCheek.be Fact Sheet for Healthcare Providers: GravelBags.it This test is not yet approved or cleared by the [place] FDA and  has been authorized for detection and/or diagnosis of SARS-CoV-2 by  FDA under an Emergency Use Authorization (EUA). This EUA will remain  in effect (meaning this test can be used) for the duration of the  Covid-19 declaration under Section 564(b)(1) of the Act, 21  U.S.C. section 360bbb-3(b)(1), unless the authorization is  terminated or revoked. Performed at [facility], [address] [place], [place] [address]   MRSA PCR Screening     Status: None   Collection Time: 07/11/19  6:54 PM   Specimen: Nasal Mucosa; Nasopharyngeal  Result Value Ref Range Status   MRSA by PCR NEGATIVE NEGATIVE Final    Comment:        The GeneXpert MRSA Assay (FDA approved for NASAL specimens only), is one component of a comprehensive MRSA colonization surveillance program. It is not intended to diagnose MRSA infection nor to guide or monitor treatment for MRSA infections. Performed at [facility], [address] [place], [place] [address]          Radiology Studies: DG Chest Portable 1 View  Result Date: 07/11/2019 CLINICAL DATA:   Worsening shortness of breath over the past 24 hours. EXAM: PORTABLE CHEST 1 VIEW COMPARISON:  Single-view of the chest 04/27/2019, 03/01/2019 and 12/03/2015. FINDINGS: Cardiomegaly and pulmonary vascular congestion are unchanged. Atherosclerosis is noted. Pacing device is in place. No acute or focal bony abnormality. IMPRESSION: 1. No change in cardiomegaly and pulmonary vascular congestion. 2. Atherosclerosis. Electronically Signed   By: Inge Rise M.D.   On: 07/11/2019 13:31   ECHOCARDIOGRAM COMPLETE  Result Date: 07/12/2019   ECHOCARDIOGRAM REPORT   Patient Name:   CHAROLET MCKUSICK Date of Exam: 07/12/2019 Medical Rec #:  [id number]       Height:       62.0 in Accession #:    [id number]      Weight:  192.0 lb Date of Birth:  1929-08-08        BSA:          1.88 m Patient Age:    63 years        BP:           136/76 mmHg Patient Gender: F               HR:           72 bpm. Exam Location:  [facility] Procedure: 2D Echo, Color Doppler and Cardiac Doppler Indications:     I50.31 CHF-Acute Diastolic  History:         Patient has prior history of Echocardiogram examinations, most                  recent 04/29/2019. CHF, COPD and CKD; Risk Factors:Hypertension                  and Diabetes.  Sonographer:     Charmayne Sheer RDCS (AE) Referring Phys:  Baker Janus Soledad Gerlach NIU Diagnosing Phys: Yolonda Kida MD  Sonographer Comments: Suboptimal parasternal window and no subcostal window. TDS due to pt sitting in recliner. Pt did not have IV access. IMPRESSIONS  1. Left ventricular ejection fraction, by visual estimation, is 60 to 65%. The left ventricle has normal function. Left ventricular septal wall thickness was normal. Normal left ventricular posterior wall thickness. There is no left ventricular hypertrophy.  2. The left ventricle has no regional wall motion abnormalities.  3. Global right ventricle has normal systolic function.The right ventricular size is normal. No increase in right ventricular wall thickness.  4. Left  atrial size was normal.  5. Right atrial size was normal.  6. Moderate mitral annular calcification.  7. The mitral valve is myxomatous. Mild mitral valve regurgitation. Moderate mitral stenosis.  8. The tricuspid valve is grossly normal.  9. The aortic valve is grossly normal. Aortic valve regurgitation is trivial. Mild to moderate aortic valve sclerosis/calcification without any evidence of aortic stenosis. 10. The pulmonic valve was grossly normal. Pulmonic valve regurgitation is not visualized. 11. Severely elevated pulmonary artery systolic pressure. 12. A pacer wire is visualized. 13. Left ventricular ejection fraction by PLAX is is 62 % 14. The atrial septum is grossly normal. FINDINGS  Left Ventricle: Left ventricular ejection fraction, by visual estimation, is 60 to 65%. Left ventricular ejection fraction by PLAX is is 62 % The left ventricle has normal function. The left ventricle has no regional wall motion abnormalities. Normal left ventricular posterior wall thickness. There is no left ventricular hypertrophy. Right Ventricle: The right ventricular size is normal. No increase in right ventricular wall thickness. Global RV systolic function is has normal systolic function. The tricuspid regurgitant velocity is 3.89 m/s, and with an assumed right atrial pressure  of 10 mmHg, the estimated right ventricular systolic pressure is severely elevated at 70.5 mmHg. Left Atrium: Left atrial size was normal in size. Right Atrium: Right atrial size was normal in size Pericardium: There is no evidence of pericardial effusion. Mitral Valve: The mitral valve is myxomatous. Moderate mitral annular calcification. Mild mitral valve regurgitation. Moderate mitral valve stenosis by observation. MV peak gradient, 14.0 mmHg. Trivial MS. Tricuspid Valve: The tricuspid valve is grossly normal. Tricuspid valve regurgitation mild-moderate. Aortic Valve: The aortic valve is grossly normal. Aortic valve regurgitation is trivial.  Mild to moderate aortic valve sclerosis/calcification is present, without any evidence of aortic stenosis. Aortic valve mean gradient measures 10.0 mmHg.  Aortic valve peak gradient measures 17.0 mmHg. Aortic valve area, by VTI measures 0.99 cm. Pulmonic Valve: The pulmonic valve was grossly normal. Pulmonic valve regurgitation is not visualized. Pulmonic regurgitation is not visualized. Aorta: The aortic root is normal in size and structure. IAS/Shunts: The atrial septum is grossly normal. Additional Comments: A pacer wire is visualized.  LEFT VENTRICLE PLAX 2D LV EF:         Left            Diastology                ventricular     LV e' lateral:   3.59 cm/s                ejection        LV E/e' lateral: 45.7                fraction by                PLAX is is                62 % LVIDd:         4.08 cm LVIDs:         2.72 cm LV PW:         0.84 cm LV IVS:        1.03 cm LVOT diam:     1.80 cm LV SV:         46 ml LV SV Index:   23.02 LVOT Area:     2.54 cm  LEFT ATRIUM             Index LA diam:        4.90 cm 2.61 cm/m LA Vol (A2C):   41.6 ml 22.14 ml/m LA Vol (A4C):   79.6 ml 42.37 ml/m LA Biplane Vol: 58.3 ml 31.03 ml/m  AORTIC VALVE                    PULMONIC VALVE AV Area (Vmax):    1.31 cm     PV Vmax:       0.83 m/s AV Area (Vmean):   1.28 cm     PV Vmean:      62.600 cm/s AV Area (VTI):     0.99 cm     PV VTI:        0.196 m AV Vmax:           206.00 cm/s  PV Peak grad:  2.8 mmHg AV Vmean:          147.000 cm/s PV Mean grad:  2.0 mmHg AV VTI:            0.471 m AV Peak Grad:      17.0 mmHg AV Mean Grad:      10.0 mmHg LVOT Vmax:         106.00 cm/s LVOT Vmean:        73.700 cm/s LVOT VTI:          0.184 m LVOT/AV VTI ratio: 0.39  AORTA Ao Root diam: 3.00 cm MITRAL VALVE                         TRICUSPID VALVE MV Area (PHT): 2.50 cm              TR Peak grad:   60.5 mmHg MV Peak grad:  14.0 mmHg  TR Vmax:        433.00 cm/s MV Mean grad:  6.0 mmHg MV Vmax:       1.87 m/s               SHUNTS MV Vmean:      117.0 cm/s            Systemic VTI:  0.18 m MV VTI:        0.59 m                Systemic Diam: 1.80 cm MV PHT:        87.87 msec MV Decel Time: 303 msec MV E velocity: 164.00 cm/s 103 cm/s MV A velocity: 119.00 cm/s 70.3 cm/s MV E/A ratio:  1.38        1.5  Dwayne D Callwood MD Electronically signed by Yolonda Kida MD Signature Date/Time: 07/12/2019/5:05:28 PM    Final         Scheduled Meds: . aspirin EC  81 mg Oral Daily  . atorvastatin  10 mg Oral Daily  . carvedilol  6.25 mg Oral BID WC  . Chlorhexidine Gluconate Cloth  6 each Topical Daily  . dextromethorphan-guaiFENesin  1 tablet Oral BID  . diltiazem  240 mg Oral Daily  . furosemide  80 mg Intravenous Q12H  . hydrALAZINE  37.5 mg Oral TID  . insulin aspart  0-9 Units Subcutaneous Q4H  . insulin aspart protamine- aspart  20 Units Subcutaneous Q breakfast   And  . insulin aspart protamine- aspart  16 Units Subcutaneous Q supper  . ipratropium  2 puff Inhalation Q4H  . mouth rinse  15 mL Mouth Rinse BID  . pantoprazole  40 mg Oral Daily  . senna-docusate  1 tablet Oral QHS  . sodium chloride flush  3 mL Intravenous Q12H  . Warfarin - Pharmacist Dosing Inpatient   Does not apply q1800   Continuous Infusions: . sodium chloride       LOS: 1 day    Time spent: 35 minutes    Edwin Dada, MD [facility] 07/12/2019, 9:40 PM     Please page though [place] or Epic secure chat:  For [employer], Adult nurse

## 2019-07-12 NOTE — Progress Notes (Signed)
Inpatient Diabetes Program Recommendations  AACE/ADA: New Consensus Statement on Inpatient Glycemic Control (2015)  Target Ranges:  Prepandial:   less than 140 mg/dL      Peak postprandial:   less than 180 mg/dL (1-2 hours)      Critically ill patients:  140 - 180 mg/dL   Lab Results  Component Value Date   GLUCAP 111 (H) 07/12/2019   HGBA1C 5.9 (H) 07/12/2019    Review of Glycemic Control  Results for Kimberly Peterson, Kimberly Peterson (MRN [id number]) as of 07/12/2019 13:48  Ref. Range 07/11/2019 18:45 07/12/2019 00:22 07/12/2019 04:04 07/12/2019 08:10 07/12/2019 11:46  Glucose-Capillary Latest Ref Range: 70 - 99 mg/dL 190 (H) 69 (L) 111 (H) 121 (H) 111 (H)      Inpatient Diabetes Program Recommendations:     Confirmed with RN that patient is eating her meals  -Please consider Novolog 0-9 TID with meals to avoid hypoglycemia  Thank you, Geoffry Paradise, RN, BSN Diabetes Coordinator Inpatient Diabetes Program [phone] (team pager from 8a-5p)

## 2019-07-12 NOTE — Progress Notes (Signed)
*  PRELIMINARY RESULTS* Echocardiogram 2D Echocardiogram has been performed.  Kimberly Peterson Char [name] 07/12/2019, 10:39 AM

## 2019-07-12 NOTE — Consult Note (Signed)
ANTICOAGULATION CONSULT NOTE  Pharmacy Consult for warfarin dosing Indication: atrial fibrillation  Patient Measurements: Height: 5\' 2"  (157.5 cm) Weight: 192 lb 0.3 oz (87.1 kg) IBW/kg (Calculated) : 50.1  Vital Signs: Temp: 97.6 F (36.4 C) (01/05 0600) Temp Source: Oral (01/05 0600) BP: 137/49 (01/05 0600) Pulse Rate: 72 (01/05 0600)  Labs: Recent Labs    07/11/19 1241 07/11/19 1750 07/12/19 0502  HGB 7.7*  --   --   HCT 27.2*  --   --   PLT 402*  --   --   LABPROT 37.3*  --  37.6*  INR 3.8*  --  3.8*  CREATININE 1.40*  --  1.21*  TROPONINIHS 54* 165*  --     Estimated Creatinine Clearance: 33.6 mL/min (A) (by C-G formula based on SCr of 1.21 mg/dL (H)).   Medical History: Past Medical History:  Diagnosis Date  . CHF (congestive heart failure) ([place])   . Chronic kidney disease   . COPD (chronic obstructive pulmonary disease) ([place])   . Diabetes mellitus without complication ([place])   . Hypertension     Medications:  Scheduled:  . aspirin EC  81 mg Oral Daily  . atorvastatin  10 mg Oral Daily  . carvedilol  6.25 mg Oral BID WC  . chlorhexidine  15 mL Mouth Rinse BID  . Chlorhexidine Gluconate Cloth  6 each Topical Daily  . dextromethorphan-guaiFENesin  1 tablet Oral BID  . diltiazem  240 mg Oral Daily  . furosemide  80 mg Intravenous Q12H  . hydrALAZINE  37.5 mg Oral TID  . insulin aspart  0-9 Units Subcutaneous Q4H  . insulin aspart protamine- aspart  20 Units Subcutaneous Q breakfast   And  . insulin aspart protamine- aspart  16 Units Subcutaneous Q supper  . ipratropium  2 puff Inhalation Q4H  . mouth rinse  15 mL Mouth Rinse q12n4p  . pantoprazole  40 mg Oral Daily  . sodium chloride flush  3 mL Intravenous Q12H  . Warfarin - Pharmacist Dosing Inpatient   Does not apply q1800    Assessment: 86 year old female with PMH afib and PE on warfarin PTA. Home dose of warfarin 5 mg daily except 7.5 mg on Thursday. Pharmacy consulted to continue warfarin in  this patient. There are no new DDIs since the previous note.  Date   INR   Dose 1/4   3.8   HOLD 1/5   3.8   HOLD  Goal of Therapy:  INR 2-3 Monitor platelets by anticoagulation protocol: Yes   Plan:   Hold warfarin again today  monitor INR trend for appropriate dosing  INR in am  CBC at least every 72 hours  Dallie Piles 07/12/2019,7:07 AM

## 2019-07-13 ENCOUNTER — Ambulatory Visit: Payer: Medicare Other | Admitting: Family

## 2019-07-13 DIAGNOSIS — Z515 Encounter for palliative care: Secondary | ICD-10-CM

## 2019-07-13 DIAGNOSIS — Z7189 Other specified counseling: Secondary | ICD-10-CM

## 2019-07-13 DIAGNOSIS — Z66 Do not resuscitate: Secondary | ICD-10-CM

## 2019-07-13 LAB — IRON AND TIBC
Iron: 15 ug/dL — ABNORMAL LOW (ref 28–170)
Saturation Ratios: 4 % — ABNORMAL LOW (ref 10.4–31.8)
TIBC: 430 ug/dL (ref 250–450)
UIBC: 415 ug/dL

## 2019-07-13 LAB — BASIC METABOLIC PANEL
Anion gap: 9 (ref 5–15)
BUN: 27 mg/dL — ABNORMAL HIGH (ref 8–23)
CO2: 33 mmol/L — ABNORMAL HIGH (ref 22–32)
Calcium: 9.1 mg/dL (ref 8.9–10.3)
Chloride: 100 mmol/L (ref 98–111)
Creatinine, Ser: 1.23 mg/dL — ABNORMAL HIGH (ref 0.44–1.00)
GFR calc Af Amer: 46 mL/min — ABNORMAL LOW (ref >60)
GFR calc non Af Amer: 39 mL/min — ABNORMAL LOW (ref >60)
Glucose, Bld: 73 mg/dL (ref 70–99)
Potassium: 3.7 mmol/L (ref 3.5–5.1)
Sodium: 142 mmol/L (ref 135–145)

## 2019-07-13 LAB — CBC
HCT: 25.3 % — ABNORMAL LOW (ref 36.0–46.0)
Hemoglobin: 7.1 g/dL — ABNORMAL LOW (ref 12.0–15.0)
MCH: 21 pg — ABNORMAL LOW (ref 26.0–34.0)
MCHC: 28.1 g/dL — ABNORMAL LOW (ref 30.0–36.0)
MCV: 74.9 fL — ABNORMAL LOW (ref 80.0–100.0)
Platelets: 319 10*3/uL (ref 150–400)
RBC: 3.38 MIL/uL — ABNORMAL LOW (ref 3.87–5.11)
RDW: 17.4 % — ABNORMAL HIGH (ref 11.5–15.5)
WBC: 9.8 10*3/uL (ref 4.0–10.5)
nRBC: 0.2 % (ref 0.0–0.2)

## 2019-07-13 LAB — PROTIME-INR
INR: 4.3 (ref 0.8–1.2)
Prothrombin Time: 40.9 seconds — ABNORMAL HIGH (ref 11.4–15.2)

## 2019-07-13 LAB — GLUCOSE, CAPILLARY
Glucose-Capillary: 78 mg/dL (ref 70–99)
Glucose-Capillary: 97 mg/dL (ref 70–99)
Glucose-Capillary: 175 mg/dL — ABNORMAL HIGH (ref 70–99)
Glucose-Capillary: 253 mg/dL — ABNORMAL HIGH (ref 70–99)
Glucose-Capillary: 142 mg/dL — ABNORMAL HIGH (ref 70–99)
Glucose-Capillary: 170 mg/dL — ABNORMAL HIGH (ref 70–99)
Glucose-Capillary: 130 mg/dL — ABNORMAL HIGH (ref 70–99)

## 2019-07-13 LAB — FERRITIN: Ferritin: 11 ng/mL (ref 11–307)

## 2019-07-13 MED ORDER — INSULIN ASPART 100 UNIT/ML ~~LOC~~ SOLN
0.0000 [IU] | Freq: Every day | SUBCUTANEOUS | Status: DC
  Filled 2019-07-13: qty 1

## 2019-07-13 MED ORDER — INSULIN ASPART 100 UNIT/ML ~~LOC~~ SOLN
0.0000 [IU] | Freq: Three times a day (TID) | SUBCUTANEOUS | Status: DC
  Administered 2019-07-13: 1 [IU] via SUBCUTANEOUS
  Administered 2019-07-14: 3 [IU] via SUBCUTANEOUS
  Administered 2019-07-14: 2 [IU] via SUBCUTANEOUS
  Administered 2019-07-14: 1 [IU] via SUBCUTANEOUS
  Administered 2019-07-15: 2 [IU] via SUBCUTANEOUS
  Administered 2019-07-15: 1 [IU] via SUBCUTANEOUS
  Administered 2019-07-16: 2 [IU] via SUBCUTANEOUS
  Administered 2019-07-16: 3 [IU] via SUBCUTANEOUS
  Administered 2019-07-16: 1 [IU] via SUBCUTANEOUS
  Administered 2019-07-17: 17:00:00 2 [IU] via SUBCUTANEOUS
  Administered 2019-07-17: 1 [IU] via SUBCUTANEOUS
  Administered 2019-07-18: 2 [IU] via SUBCUTANEOUS
  Filled 2019-07-13 (×13): qty 1

## 2019-07-13 NOTE — Plan of Care (Signed)
Pt's MEWS score was a 3 based on RR and temp.  She's currently on bipap.  Checked with respiratory therapist and he said this is WNL for her and bipap.  Her temp was taken via axillary b/c of the bipap mask.  Previously her temp has been WNL. Per axillary, temp is 97.6.

## 2019-07-13 NOTE — TOC Initial Note (Signed)
Transition of Care [facility]) - Initial/Assessment Note    Patient Details  Name: Kimberly Peterson MRN: [id number] Date of Birth: [birth year]  Transition of Care [facility]) CM/SW Contact:    Su Hilt, RN Phone Number: 07/13/2019, 2:18 PM  Clinical Narrative:                 Talked to the patient about DC needs and plancs, she is open with HH [facility] and has DME at home (See below) no additional needs Her daughter lives with her and provides the transportation for her, she has a PCP appointment already set up. Will continue to monitor for needs  Expected Discharge Plan: [place] Barriers to Discharge: Continued Medical Work up   Patient Goals and CMS Choice Patient states their goals for this hospitalization and ongoing recovery are:: go home      Expected Discharge Plan and Services Expected Discharge Plan: [place]   Discharge Planning Services: CM Consult   Living arrangements for the past 2 months: Single Family Home                 DME Arranged: N/A         HH Arranged: PT, Nurse's Aide HH Agency: [facility] [employer] Date [place]: 07/13/19 Time [place]: 27 Representative spoke with at [place]: [name]  Prior Living Arrangements/Services Living arrangements for the past 2 months: [place] with:: Adult Children Patient language and need for interpreter reviewed:: Yes        Need for Family Participation in Patient Care: No (Comment) Care giver support system in place?: Yes (comment) Current home services: DME, Home PT, Homehealth aide(RW, BSC, Toilet Riser, Oxygen) Criminal Activity/Legal Involvement Pertinent to Current Situation/Hospitalization: No - Comment as needed  Activities of Daily Living Home Assistive Devices/Equipment: Environmental consultant (specify type) ADL Screening (condition at time of admission) Patient's cognitive ability adequate to safely  complete daily activities?: No Is the patient deaf or have difficulty hearing?: No Does the patient have difficulty seeing, even when wearing glasses/contacts?: No Does the patient have difficulty concentrating, remembering, or making decisions?: No Patient able to express need for assistance with ADLs?: Yes Does the patient have difficulty dressing or bathing?: Yes Independently performs ADLs?: No Communication: Needs assistance Is this a change from baseline?: Pre-admission baseline Dressing (OT): Needs assistance Is this a change from baseline?: Pre-admission baseline Grooming: Needs assistance Is this a change from baseline?: Pre-admission baseline Feeding: Independent Bathing: Needs assistance Is this a change from baseline?: Pre-admission baseline Toileting: Needs assistance Is this a change from baseline?: Pre-admission baseline In/Out Bed: Needs assistance Is this a change from baseline?: Pre-admission baseline Walks in Home: Independent Does the patient have difficulty walking or climbing stairs?: Yes Weakness of Legs: Both Weakness of Arms/Hands: None  Permission Sought/Granted   Permission granted to share information with : Yes, Verbal Permission Granted              Emotional Assessment   Attitude/Demeanor/Rapport: Engaged Affect (typically observed): Appropriate Orientation: : Oriented to Self, Oriented to Place, Oriented to  Time, Oriented to Situation Alcohol / Substance Use: Not Applicable Psych Involvement: No (comment)  Admission diagnosis:  Acute on chronic systolic CHF (congestive heart failure) (HCC) [I50.23] Acute on chronic respiratory failure with hypoxia ([place]) [J96.21] Patient Active Problem List   Diagnosis Date Noted  . Type II diabetes mellitus with renal manifestations ([place]) 07/11/2019  .  Microcytic anemia 07/11/2019  . Lactic acidosis 07/11/2019  . Hypokalemia 07/11/2019  . Elevated troponin 07/11/2019  . Acute renal failure  superimposed on stage 3a chronic kidney disease ([place]) 07/11/2019  . Acute on chronic diastolic heart failure ([place]) 04/27/2019  . Acute and chronic respiratory failure with hypoxia ([place]) 03/01/2019  . Acute respiratory failure with hypoxia ([place]) 02/07/2019  . Type 2 diabetes with peripheral circulatory disorder, controlled ([place]) 10/17/2018  . Complete heart block ([place]) 09/09/2018  . Severe aortic valve stenosis 09/09/2018  . Lymphedema 08/12/2018  . Uncontrolled type 2 diabetes mellitus with hyperglycemia ([place]) 02/25/2018  . Atrial fibrillation ([place]) 02/25/2018  . Encounter for current long-term use of anticoagulants 02/25/2018  . Dependence on supplemental oxygen 02/25/2018  . Lower extremity edema 02/25/2018  . CHF (congestive heart failure) ([place]) 12/04/2015  . Diabetes mellitus ([place]) 12/04/2015  . Chronic obstructive pulmonary disease ([place]) 09/20/2015  . Diastolic CHF, acute on chronic (HCC) 09/19/2015  . Benign essential HTN 09/19/2015  . Controlled diabetes mellitus without complication, with long-term current use of insulin ([place]) 09/19/2015  . Hyperlipidemia, mixed 09/04/2014  . Pulmonary embolism ([place]) 09/23/2013   PCP:  [name], MD Pharmacy:   [facility] [address], [place] - [place] [address] [place] [address] Phone: [phone] Fax: [place], [place] - [address] [facility] [address] [place] [address] Phone: [phone] Fax: [place] [id number] [place], [place] - [place] AT [place] [address] [place] [address] Phone: [phone] Fax: [phone]     Social Determinants of Health (SDOH) Interventions    Readmission Risk Interventions Readmission Risk Prevention Plan 07/13/2019 03/05/2019 03/03/2019  Transportation Screening Complete Complete Complete  PCP or Specialist Appt within 3-5 Days - Complete  -  HRI or [place] - Complete Complete  Social Work Consult for [place] Planning/Counseling - Complete -  Palliative Care Screening - Not Applicable Complete  Medication Review (RN Care Manager) Referral to Pharmacy Complete Complete  HRI or Home Care Consult Complete - -  Palliative Care Screening Complete - -  [place] Not Complete - -  Some recent data might be hidden

## 2019-07-13 NOTE — Progress Notes (Signed)
PROGRESS NOTE                                                                                                                                                                                                             Patient Demographics:    Kimberly Peterson, is a 86 y.o. female, DOB - Jul 21, 1929, [id number]  Admit date - 07/11/2019   Admitting Physician Ivor Costa, MD  Outpatient Primary MD for the patient is [name], MD  LOS - 2  Outpatient Specialists: Cardiology  Chief Complaint  Patient presents with  . Shortness of Breath       Brief Narrative 86 year old obese female with diastolic CHF, chronic kidney disease stage III, A. fib on Coumadin, s/p AV replacement, pacemaker due to CHB, COPD on 2 L home O2, diabetes mellitus and hypertension presented with dyspnea on exertion with findings of acute on chronic diastolic CHF.  In the ED she was in respiratory distress and required BiPAP.    Subjective:   Reports breathing better from admission but still about 70% of baseline.   Assessment  & Plan :    Principal Problem:   Acute on chronic diastolic heart failure (HCC) Acute on chronic respiratory failure with hypoxia (HCC) Diuresing well on IV Lasix.  Maintaining sats on 2 L via nasal cannula.  Continue current dose of IV Lasix.  Strict I's/O and daily weight. -2L  Negative balance since admission. Palliative care consulted for goals of care discussion.   Active Problems: Chronic A. fib Rate controlled on Coreg and Cardizem.  Continue Coumadin    Pulmonary embolism ,Chronic (HCC) Continue Coumadin.    Benign essential HTN Stable.  Continue Cardizem  Chronic obstructive pulmonary disease with chronic hypoxic respiratory failure (HCC) Continue home inhaler.  On 2 L nasal cannula.     Type II diabetes mellitus with renal manifestations (HCC) Continue insulin and sliding scale coverage.    Acute renal  failure superimposed on stage 3a chronic kidney disease (HCC) Likely cardiorenal.  Improved with diuresis.  Hypokalemia Replenished  Microcytic anemia Hemoglobin of 7.1 (baseline around 8).  Monitor for now.  Continue iron supplement.  Transfuse if hemoglobin <7.    Code Status : Full code  Family Communication  : We will update daughter  Disposition Plan  : Home possibly in the next 24-48 hours  if continues to improve  Barriers For Discharge : Active symptoms, still short of breath and has fluid overload  Consults  : None  Procedures  : None  DVT Prophylaxis  : Coumadin  Lab Results  Component Value Date   PLT 319 07/13/2019    Antibiotics  :    Anti-infectives (From admission, onward)   None        Objective:   Vitals:   07/13/19 0000 07/13/19 0047 07/13/19 0252 07/13/19 0732  BP: (!) 101/44 (!) 135/54  (!) 120/50  Pulse: 73 71  72  Resp: (!) 21 (!) 22 17 17   Temp:  (!) 96.6 F (35.9 C)  98.3 F (36.8 C)  TempSrc:  Axillary  Axillary  SpO2: 100% 99%  97%  Weight:      Height:        Wt Readings from Last 3 Encounters:  07/12/19 87.1 kg  06/22/19 83.5 kg  06/07/19 82.9 kg     Intake/Output Summary (Last 24 hours) at 07/13/2019 0902 Last data filed at 07/12/2019 2214 Gross per 24 hour  Intake 483 ml  Output --  Net 483 ml     Physical Exam  Gen: not in distress HEENT: moist mucosa, supple neck Chest: bibasilar crackles CVS: N S1&S2, no murmurs,  GI: soft, NT, ND, BS+ Musculoskeletal: warm,1+ edema     Data Review:    CBC Recent Labs  Lab 07/11/19 1241 07/13/19 0441  WBC 8.5 9.8  HGB 7.7* 7.1*  HCT 27.2* 25.3*  PLT 402* 319  MCV 75.8* 74.9*  MCH 21.4* 21.0*  MCHC 28.3* 28.1*  RDW 17.5* 17.4*  LYMPHSABS 2.1  --   MONOABS 0.9  --   EOSABS 0.1  --   BASOSABS 0.1  --     Chemistries  Recent Labs  Lab 07/11/19 1241 07/12/19 0502 07/13/19 0441  NA 139 144 142  K 3.4* 4.0 3.7  CL 98 102 100  CO2 26 30 33*  GLUCOSE  256* 126* 73  BUN 25* 26* 27*  CREATININE 1.40* 1.21* 1.23*  CALCIUM 9.0 9.1 9.1  MG  --  2.3  --   AST 25  --   --   ALT 12  --   --   ALKPHOS 72  --   --   BILITOT 0.8  --   --    ------------------------------------------------------------------------------------------------------------------ Recent Labs    07/12/19 0502  CHOL 125  HDL 51  LDLCALC 59  TRIG 73  CHOLHDL 2.5    Lab Results  Component Value Date   HGBA1C 5.9 (H) 07/12/2019   ------------------------------------------------------------------------------------------------------------------ No results for input(s): TSH, T4TOTAL, T3FREE, THYROIDAB in the last 72 hours.  Invalid input(s): FREET3 ------------------------------------------------------------------------------------------------------------------ Recent Labs    07/13/19 0441  FERRITIN 11  TIBC 430  IRON 15*    Coagulation profile Recent Labs  Lab 07/11/19 1241 07/12/19 0502 07/13/19 0441  INR 3.8* 3.8* 4.3*    No results for input(s): DDIMER in the last 72 hours.  Cardiac Enzymes No results for input(s): CKMB, TROPONINI, MYOGLOBIN in the last 168 hours.  Invalid input(s): CK ------------------------------------------------------------------------------------------------------------------    Component Value Date/Time   BNP 735.0 (H) 07/11/2019 1242    Inpatient Medications  Scheduled Meds: . aspirin EC  81 mg Oral Daily  . atorvastatin  10 mg Oral Daily  . carvedilol  6.25 mg Oral BID WC  . Chlorhexidine Gluconate Cloth  6 each Topical Daily  . dextromethorphan-guaiFENesin  1 tablet Oral BID  .  diltiazem  240 mg Oral Daily  . docusate sodium  100 mg Oral Daily  . ferrous sulfate  325 mg Oral BID WC  . furosemide  80 mg Intravenous Q12H  . hydrALAZINE  37.5 mg Oral TID  . insulin aspart  0-9 Units Subcutaneous Q4H  . insulin aspart protamine- aspart  20 Units Subcutaneous Q breakfast   And  . insulin aspart protamine-  aspart  16 Units Subcutaneous Q supper  . ipratropium  2 puff Inhalation Q4H  . mouth rinse  15 mL Mouth Rinse BID  . pantoprazole  40 mg Oral Daily  . potassium chloride  10 mEq Oral Daily  . senna-docusate  1 tablet Oral QHS  . sodium chloride flush  3 mL Intravenous Q12H  . Warfarin - Pharmacist Dosing Inpatient   Does not apply q1800   Continuous Infusions: . sodium chloride     PRN Meds:.sodium chloride, acetaminophen, albuterol, bisacodyl, hydrALAZINE, magnesium hydroxide, ondansetron (ZOFRAN) IV, sodium chloride flush  Micro Results Recent Results (from the past 240 hour(s))  Respiratory Panel by RT PCR (Flu A&B, Covid) - Nasopharyngeal Swab     Status: None   Collection Time: 07/11/19  1:07 PM   Specimen: Nasopharyngeal Swab  Result Value Ref Range Status   SARS Coronavirus 2 by RT PCR NEGATIVE NEGATIVE Final    Comment: (NOTE) SARS-CoV-2 target nucleic acids are NOT DETECTED. The SARS-CoV-2 RNA is generally detectable in upper respiratoy specimens during the acute phase of infection. The lowest concentration of SARS-CoV-2 viral copies this assay can detect is 131 copies/mL. A negative result does not preclude SARS-Cov-2 infection and should not be used as the sole basis for treatment or other patient management decisions. A negative result may occur with  improper specimen collection/handling, submission of specimen other than nasopharyngeal swab, presence of viral mutation(s) within the areas targeted by this assay, and inadequate number of viral copies (<131 copies/mL). A negative result must be combined with clinical observations, patient history, and epidemiological information. The expected result is Negative. Fact Sheet for Patients:  PinkCheek.be Fact Sheet for Healthcare Providers:  GravelBags.it This test is not yet ap proved or cleared by the [place] FDA and  has been authorized for detection  and/or diagnosis of SARS-CoV-2 by FDA under an Emergency Use Authorization (EUA). This EUA will remain  in effect (meaning this test can be used) for the duration of the COVID-19 declaration under Section 564(b)(1) of the Act, 21 U.S.C. section 360bbb-3(b)(1), unless the authorization is terminated or revoked sooner.    Influenza A by PCR NEGATIVE NEGATIVE Final   Influenza B by PCR NEGATIVE NEGATIVE Final    Comment: (NOTE) The Xpert Xpress SARS-CoV-2/FLU/RSV assay is intended as an aid in  the diagnosis of influenza from Nasopharyngeal swab specimens and  should not be used as a sole basis for treatment. Nasal washings and  aspirates are unacceptable for Xpert Xpress SARS-CoV-2/FLU/RSV  testing. Fact Sheet for Patients: PinkCheek.be Fact Sheet for Healthcare Providers: GravelBags.it This test is not yet approved or cleared by the [place] FDA and  has been authorized for detection and/or diagnosis of SARS-CoV-2 by  FDA under an Emergency Use Authorization (EUA). This EUA will remain  in effect (meaning this test can be used) for the duration of the  Covid-19 declaration under Section 564(b)(1) of the Act, 21  U.S.C. section 360bbb-3(b)(1), unless the authorization is  terminated or revoked. Performed at [facility], [address], [place], [place] [address]  MRSA PCR Screening     Status: None   Collection Time: 07/11/19  6:54 PM   Specimen: Nasal Mucosa; Nasopharyngeal  Result Value Ref Range Status   MRSA by PCR NEGATIVE NEGATIVE Final    Comment:        The GeneXpert MRSA Assay (FDA approved for NASAL specimens only), is one component of a comprehensive MRSA colonization surveillance program. It is not intended to diagnose MRSA infection nor to guide or monitor treatment for MRSA infections. Performed at [facility], [address] [place], [place] [address]     Radiology  Reports DG Chest Portable 1 View  Result Date: 07/11/2019 CLINICAL DATA:  Worsening shortness of breath over the past 24 hours. EXAM: PORTABLE CHEST 1 VIEW COMPARISON:  Single-view of the chest 04/27/2019, 03/01/2019 and 12/03/2015. FINDINGS: Cardiomegaly and pulmonary vascular congestion are unchanged. Atherosclerosis is noted. Pacing device is in place. No acute or focal bony abnormality. IMPRESSION: 1. No change in cardiomegaly and pulmonary vascular congestion. 2. Atherosclerosis. Electronically Signed   By: Inge Rise M.D.   On: 07/11/2019 13:31   ECHOCARDIOGRAM COMPLETE  Result Date: 07/12/2019   ECHOCARDIOGRAM REPORT   Patient Name:   Kimberly Peterson Date of Exam: 07/12/2019 Medical Rec #:  [id number]       Height:       62.0 in Accession #:    [id number]      Weight:       192.0 lb Date of Birth:  Jan 26, 1930        BSA:          1.88 m Patient Age:    73 years        BP:           136/76 mmHg Patient Gender: F               HR:           72 bpm. Exam Location:  [facility] Procedure: 2D Echo, Color Doppler and Cardiac Doppler Indications:     I50.31 CHF-Acute Diastolic  History:         Patient has prior history of Echocardiogram examinations, most                  recent 04/29/2019. CHF, COPD and CKD; Risk Factors:Hypertension                  and Diabetes.  Sonographer:     Charmayne Sheer RDCS (AE) Referring Phys:  Baker Janus Soledad Gerlach NIU Diagnosing Phys: Yolonda Kida MD  Sonographer Comments: Suboptimal parasternal window and no subcostal window. TDS due to pt sitting in recliner. Pt did not have IV access. IMPRESSIONS  1. Left ventricular ejection fraction, by visual estimation, is 60 to 65%. The left ventricle has normal function. Left ventricular septal wall thickness was normal. Normal left ventricular posterior wall thickness. There is no left ventricular hypertrophy.  2. The left ventricle has no regional wall motion abnormalities.  3. Global right ventricle has normal systolic function.The right  ventricular size is normal. No increase in right ventricular wall thickness.  4. Left atrial size was normal.  5. Right atrial size was normal.  6. Moderate mitral annular calcification.  7. The mitral valve is myxomatous. Mild mitral valve regurgitation. Moderate mitral stenosis.  8. The tricuspid valve is grossly normal.  9. The aortic valve is grossly normal. Aortic valve regurgitation is trivial. Mild to moderate aortic valve sclerosis/calcification without any evidence of aortic stenosis.  10. The pulmonic valve was grossly normal. Pulmonic valve regurgitation is not visualized. 11. Severely elevated pulmonary artery systolic pressure. 12. A pacer wire is visualized. 13. Left ventricular ejection fraction by PLAX is is 62 % 14. The atrial septum is grossly normal. FINDINGS  Left Ventricle: Left ventricular ejection fraction, by visual estimation, is 60 to 65%. Left ventricular ejection fraction by PLAX is is 62 % The left ventricle has normal function. The left ventricle has no regional wall motion abnormalities. Normal left ventricular posterior wall thickness. There is no left ventricular hypertrophy. Right Ventricle: The right ventricular size is normal. No increase in right ventricular wall thickness. Global RV systolic function is has normal systolic function. The tricuspid regurgitant velocity is 3.89 m/s, and with an assumed right atrial pressure  of 10 mmHg, the estimated right ventricular systolic pressure is severely elevated at 70.5 mmHg. Left Atrium: Left atrial size was normal in size. Right Atrium: Right atrial size was normal in size Pericardium: There is no evidence of pericardial effusion. Mitral Valve: The mitral valve is myxomatous. Moderate mitral annular calcification. Mild mitral valve regurgitation. Moderate mitral valve stenosis by observation. MV peak gradient, 14.0 mmHg. Trivial MS. Tricuspid Valve: The tricuspid valve is grossly normal. Tricuspid valve regurgitation mild-moderate.  Aortic Valve: The aortic valve is grossly normal. Aortic valve regurgitation is trivial. Mild to moderate aortic valve sclerosis/calcification is present, without any evidence of aortic stenosis. Aortic valve mean gradient measures 10.0 mmHg. Aortic valve peak gradient measures 17.0 mmHg. Aortic valve area, by VTI measures 0.99 cm. Pulmonic Valve: The pulmonic valve was grossly normal. Pulmonic valve regurgitation is not visualized. Pulmonic regurgitation is not visualized. Aorta: The aortic root is normal in size and structure. IAS/Shunts: The atrial septum is grossly normal. Additional Comments: A pacer wire is visualized.  LEFT VENTRICLE PLAX 2D LV EF:         Left            Diastology                ventricular     LV e' lateral:   3.59 cm/s                ejection        LV E/e' lateral: 45.7                fraction by                PLAX is is                62 % LVIDd:         4.08 cm LVIDs:         2.72 cm LV PW:         0.84 cm LV IVS:        1.03 cm LVOT diam:     1.80 cm LV SV:         46 ml LV SV Index:   23.02 LVOT Area:     2.54 cm  LEFT ATRIUM             Index LA diam:        4.90 cm 2.61 cm/m LA Vol (A2C):   41.6 ml 22.14 ml/m LA Vol (A4C):   79.6 ml 42.37 ml/m LA Biplane Vol: 58.3 ml 31.03 ml/m  AORTIC VALVE                    PULMONIC VALVE  AV Area (Vmax):    1.31 cm     PV Vmax:       0.83 m/s AV Area (Vmean):   1.28 cm     PV Vmean:      62.600 cm/s AV Area (VTI):     0.99 cm     PV VTI:        0.196 m AV Vmax:           206.00 cm/s  PV Peak grad:  2.8 mmHg AV Vmean:          147.000 cm/s PV Mean grad:  2.0 mmHg AV VTI:            0.471 m AV Peak Grad:      17.0 mmHg AV Mean Grad:      10.0 mmHg LVOT Vmax:         106.00 cm/s LVOT Vmean:        73.700 cm/s LVOT VTI:          0.184 m LVOT/AV VTI ratio: 0.39  AORTA Ao Root diam: 3.00 cm MITRAL VALVE                         TRICUSPID VALVE MV Area (PHT): 2.50 cm              TR Peak grad:   60.5 mmHg MV Peak grad:  14.0 mmHg              TR Vmax:        433.00 cm/s MV Mean grad:  6.0 mmHg MV Vmax:       1.87 m/s              SHUNTS MV Vmean:      117.0 cm/s            Systemic VTI:  0.18 m MV VTI:        0.59 m                Systemic Diam: 1.80 cm MV PHT:        87.87 msec MV Decel Time: 303 msec MV E velocity: 164.00 cm/s 103 cm/s MV A velocity: 119.00 cm/s 70.3 cm/s MV E/A ratio:  1.38        1.5  Dwayne D Callwood MD Electronically signed by Yolonda Kida MD Signature Date/Time: 07/12/2019/5:05:28 PM    Final     Time Spent in minutes  35   Juandavid Dallman M.D on 07/13/2019 at 9:02 AM  Between 7am to 7pm - Pager - [phone]  After 7pm go to [email or web address] - password [facility]  [facility] -  Office  [phone]

## 2019-07-13 NOTE — Progress Notes (Signed)
Code status changed to DNR per orders and verified with patient.  DNR bracelet applied to patient's wrist.

## 2019-07-13 NOTE — Consult Note (Signed)
ANTICOAGULATION CONSULT NOTE  Pharmacy Consult for warfarin dosing Indication: atrial fibrillation  Patient Measurements: Height: 5\' 2"  (157.5 cm) Weight: 192 lb 0.3 oz (87.1 kg) IBW/kg (Calculated) : 50.1  Vital Signs: Temp: 96.6 F (35.9 C) (01/06 0047) Temp Source: Axillary (01/06 0047) BP: 135/54 (01/06 0047) Pulse Rate: 71 (01/06 0047)  Labs: Recent Labs    07/11/19 1241 07/11/19 1750 07/12/19 0502 07/13/19 0441  HGB 7.7*  --   --  7.1*  HCT 27.2*  --   --  25.3*  PLT 402*  --   --  319  LABPROT 37.3*  --  37.6* 40.9*  INR 3.8*  --  3.8* 4.3*  CREATININE 1.40*  --  1.21* 1.23*  TROPONINIHS 54* 165*  --   --     Estimated Creatinine Clearance: 33 mL/min (A) (by C-G formula based on SCr of 1.23 mg/dL (H)).   Medical History: Past Medical History:  Diagnosis Date  . CHF (congestive heart failure) ([place])   . Chronic kidney disease   . COPD (chronic obstructive pulmonary disease) ([place])   . Diabetes mellitus without complication ([place])   . Hypertension     Medications:  Scheduled:  . aspirin EC  81 mg Oral Daily  . atorvastatin  10 mg Oral Daily  . carvedilol  6.25 mg Oral BID WC  . Chlorhexidine Gluconate Cloth  6 each Topical Daily  . dextromethorphan-guaiFENesin  1 tablet Oral BID  . diltiazem  240 mg Oral Daily  . docusate sodium  100 mg Oral Daily  . ferrous sulfate  325 mg Oral BID WC  . furosemide  80 mg Intravenous Q12H  . hydrALAZINE  37.5 mg Oral TID  . insulin aspart  0-9 Units Subcutaneous Q4H  . insulin aspart protamine- aspart  20 Units Subcutaneous Q breakfast   And  . insulin aspart protamine- aspart  16 Units Subcutaneous Q supper  . ipratropium  2 puff Inhalation Q4H  . mouth rinse  15 mL Mouth Rinse BID  . pantoprazole  40 mg Oral Daily  . potassium chloride  10 mEq Oral Daily  . senna-docusate  1 tablet Oral QHS  . sodium chloride flush  3 mL Intravenous Q12H  . Warfarin - Pharmacist Dosing Inpatient   Does not apply q1800     Assessment: 86 year old female with PMH afib and PE on warfarin PTA admitted with AECHF. Home dose of warfarin 5 mg daily except 7.5 mg on Thursday. Pharmacy consulted to continue warfarin in this patient. There are no new DDIs since the previous note.  Date   INR   Dose 1/4   3.8   HOLD 1/5   3.8   HOLD 1/6   4.3   HOLD  Goal of Therapy:  INR 2-3 Monitor platelets by anticoagulation protocol: Yes   Plan:   INR increased. Hold warfarin again today. Hgb 7.1  Plt 319  monitor INR trend for appropriate dosing  INR in am  CBC at least every 72 hours  Lesean Woolverton A 07/13/2019,7:22 AM

## 2019-07-13 NOTE — Consult Note (Signed)
   Consultation Note Date: 07/13/2019   Patient Name: Kimberly Peterson  DOB: 11/27/1929  MRN: [id number]  Age / Sex: 86 y.o., female   PCP: Khan, Fozia M, MD Referring Physician: Dhungel, Nishant, MD   REASON FOR CONSULTATION:Establishing goals of care  Palliative Care consult requested for goals of care in this 86 y.o. female with multiple medical problems including diabetes, GERD, hypertension, hyperlipidemia, CKD stage III, diastolic CHF, atrial fibrillation (Coumadin), aortic valve replacement, and pacemaker due to heart block. [name] presented to ED with complaints of shortness of breath. During her ED course she required BiPAP for respiratory support. BNP 735, Troponin 54, lactic acid 3.5, COVID-19 negative. Chest x-ray showed vascular congestion and cardiomegaly. Since admission patient has been receiving IV lasix. Saturations are being maintained on 2L/[place].   Clinical Assessment and Goals of Care: I have reviewed medical records including lab results, imaging, Epic notes, and MAR, received report from the bedside RN, and assessed the patient. I met at the bedside with patient to discuss diagnosis prognosis, GOC, EOL wishes, disposition and options. Patient is awake, alert, oriented x3. She denies pain but does endorses some shortness of breath at times.   Kimberly Peterson is familiar to the Palliative team. She has been seen on previous admissions. I re-introduced Palliative Medicine as specialized medical care for people living with serious illness. It focuses on providing relief from the symptoms and stress of a serious illness. The goal is to improve quality of life for both the patient and the family. Patient verbalized understanding and appreciation.   Patient lives with [name]. Daughter assist with care and provides transportation to all appointments. [name] reports she has a home health nurse that comes out once a month to check in on her. She is also receiving  support from outpatient palliative.   Prior to admission she reports being able to provide most of her ADLs independently. She uses a C-PAP machine in the home. She also utilizes a walker for ambulatory assistance and stability. She reports her appetite is good. She reports she recently had 6 teeth removed the week of Thanksgiving and are trying to get used to wearing her lower dentures.   We discussed Her current illness and what it means in the larger context of Her on-going co-morbidities. With specific discussions regarding her respiratory failure, CHF, and COPD. Natural disease trajectory and expectations at EOL were discussed.  Kimberly Peterson verbalizes understanding of her current illness. She reports "I try to stay out of the hospital if I can and that is why the nurses are coming!" She reports she was feeling fine and began experiencing shortness of breath and fluid retention "out of the blue". Therapeutic listening and support given. We discussed her chf in detailed.   I attempted to elicit values and goals of care important to the patient.    Kimberly Peterson reports she remains hopeful she will improve from "this episode to get back home and feeling better. She does understand the trajectory of her disease with likelihood of reoccurrence at any given time.   She states she remains hopeful but is also prepared for when God says her time is up.   Support given and used this opportunity to discuss her current full code status with consideration of her current illness. [name] reports she would not want to be on life-support or go through the trauma of CPR. She reports "God has the last say anyway and when it is   my time it is my time!"  I educated patient based on her expressed wishes recommendations for DNR/DNI would most appropriately align. She verbalized understanding and request to change her status. I again educated patient on DNR/DNI. She verbalized understanding again emphasizing she would  not want to undergo heroic measures and confirming her wishes for DNR/DNI. Patient educated that RN will place bracelet on her to identify her wishes to the medical team. She verbalized understanding and appreciation.   Daughter, Kimberly is her POA in the event she is not able to make medical decisions for herself.   Patient is currently connected and active with outpatient palliative support ([facility]).   Questions and concerns were addressed. The family was encouraged to call with questions or concerns.  PMT will continue to support holistically.   SOCIAL HISTORY:     reports that she has never smoked. She has never used smokeless tobacco. She reports that she does not drink alcohol or use drugs.  CODE STATUS: DNR  ADVANCE DIRECTIVES: Kimberly Peterson (daughter)   SYMPTOM MANAGEMENT: Per attending   Palliative Prophylaxis:   Aspiration, Bowel Regimen, Frequent Pain Assessment, Oral Care and Turn Reposition  PSYCHO-SOCIAL/SPIRITUAL:  Support System: Family   Desire for further Chaplaincy support:NO   Additional Recommendations (Limitations, Scope, Preferences):  Continue to treat the treatable, DNR/DNI    PAST MEDICAL HISTORY: Past Medical History:  Diagnosis Date  . CHF (congestive heart failure) ([facility])   . Chronic kidney disease   . COPD (chronic obstructive pulmonary disease) ([facility])   . Diabetes mellitus without complication ([facility])   . Hypertension     PAST SURGICAL HISTORY:  Past Surgical History:  Procedure Laterality Date  . aoric valve replacemet      st Jude  . CATARACT EXTRACTION, BILATERAL    . PACEMAKER PLACEMENT    . VALVE REPLACEMENT      ALLERGIES:  has No Known Allergies.   MEDICATIONS:  Current Facility-Administered Medications  Medication Dose Route Frequency Provider Last Rate Last Admin  . 0.9 %  sodium chloride infusion  250 mL Intravenous PRN Niu, Xilin, MD      . acetaminophen (TYLENOL) tablet 650 mg  650 mg Oral Q6H PRN Niu, Xilin, MD       . albuterol (VENTOLIN HFA) 108 (90 Base) MCG/ACT inhaler 2 puff  2 puff Inhalation Q4H PRN Niu, Xilin, MD   2 puff at 07/11/19 1744  . aspirin EC tablet 81 mg  81 mg Oral Daily Niu, Xilin, MD   81 mg at 07/13/19 1004  . atorvastatin (LIPITOR) tablet 10 mg  10 mg Oral Daily Niu, Xilin, MD   10 mg at 07/13/19 1005  . bisacodyl (DULCOLAX) suppository 10 mg  10 mg Rectal Daily PRN Danford, Christopher P, MD   10 mg at 07/12/19 1846  . carvedilol (COREG) tablet 6.25 mg  6.25 mg Oral BID WC Niu, Xilin, MD   6.25 mg at 07/13/19 1004  . Chlorhexidine Gluconate Cloth 2 % PADS 6 each  6 each Topical Daily Niu, Xilin, MD   6 each at 07/12/19 1000  . dextromethorphan-guaiFENesin (MUCINEX DM) 30-600 MG per 12 hr tablet 1 tablet  1 tablet Oral BID Niu, Xilin, MD   1 tablet at 07/13/19 1011  . diltiazem (CARDIZEM CD) 24 hr capsule 240 mg  240 mg Oral Daily Niu, Xilin, MD   240 mg at 07/13/19 1004  . docusate sodium (COLACE) capsule 100 mg  100 mg Oral Daily Danford, Christopher P, MD     100 mg at 07/13/19 1004  . ferrous sulfate tablet 325 mg  325 mg Oral BID WC Danford, Christopher P, MD   325 mg at 07/13/19 1004  . furosemide (LASIX) injection 80 mg  80 mg Intravenous Q12H Niu, Xilin, MD   80 mg at 07/13/19 0324  . hydrALAZINE (APRESOLINE) tablet 25 mg  25 mg Oral TID PRN Niu, Xilin, MD      . hydrALAZINE (APRESOLINE) tablet 37.5 mg  37.5 mg Oral TID Niu, Xilin, MD   37.5 mg at 07/13/19 1005  . insulin aspart (novoLOG) injection 0-5 Units  0-5 Units Subcutaneous QHS Dhungel, Nishant, MD      . insulin aspart (novoLOG) injection 0-9 Units  0-9 Units Subcutaneous TID WC Dhungel, Nishant, MD      . insulin aspart protamine- aspart (NOVOLOG MIX 70/30) injection 20 Units  20 Units Subcutaneous Q breakfast Niu, Xilin, MD   20 Units at 07/13/19 1002   And  . insulin aspart protamine- aspart (NOVOLOG MIX 70/30) injection 16 Units  16 Units Subcutaneous Q supper Niu, Xilin, MD   16 Units at 07/12/19 1641  .  ipratropium (ATROVENT HFA) inhaler 2 puff  2 puff Inhalation Q4H Niu, Xilin, MD   2 puff at 07/13/19 1305  . magnesium hydroxide (MILK OF MAGNESIA) suspension 30 mL  30 mL Oral Daily PRN Danford, Christopher P, MD   30 mL at 07/12/19 1608  . MEDLINE mouth rinse  15 mL Mouth Rinse BID Danford, Christopher P, MD   15 mL at 07/13/19 1014  . ondansetron (ZOFRAN) injection 4 mg  4 mg Intravenous Q8H PRN Niu, Xilin, MD      . pantoprazole (PROTONIX) EC tablet 40 mg  40 mg Oral Daily Niu, Xilin, MD   40 mg at 07/13/19 1004  . potassium chloride SA (KLOR-CON) CR tablet 10 mEq  10 mEq Oral Daily Danford, Christopher P, MD   10 mEq at 07/13/19 1005  . senna-docusate (Senokot-S) tablet 1 tablet  1 tablet Oral QHS Danford, Christopher P, MD   1 tablet at 07/12/19 2213  . sodium chloride flush (NS) 0.9 % injection 3 mL  3 mL Intravenous Q12H Niu, Xilin, MD   3 mL at 07/13/19 1012  . sodium chloride flush (NS) 0.9 % injection 3 mL  3 mL Intravenous PRN Niu, Xilin, MD      . Warfarin - Pharmacist Dosing Inpatient   Does not apply q1800 Niu, Xilin, MD   Given at 07/12/19 1829    VITAL SIGNS: BP (!) 120/50 (BP Location: Right Arm)   Pulse 72   Temp 98.3 F (36.8 C) (Axillary)   Resp 17   Ht 5' 2" (1.575 m)   Wt 87.1 kg   SpO2 97%   BMI 35.12 kg/m  Filed Weights   07/11/19 1850 07/12/19 0500  Weight: 88.6 kg 87.1 kg    Estimated body mass index is 35.12 kg/m as calculated from the following:   Height as of this encounter: 5' 2" (1.575 m).   Weight as of this encounter: 87.1 kg.  LABS: CBC:    Component Value Date/Time   WBC 9.8 07/13/2019 0441   HGB 7.1 (L) 07/13/2019 0441   HGB 10.5 (L) 09/28/2017 0928   HCT 25.3 (L) 07/13/2019 0441   HCT 33.2 (L) 09/28/2017 0928   PLT 319 07/13/2019 0441   PLT 287 09/28/2017 0928   Comprehensive Metabolic Panel:    Component Value Date/Time   NA 142 07/13/2019 0441     NA 144 09/28/2017 0928   NA 136 09/10/2013 0421   K 3.7 07/13/2019 0441   K 4.3  09/10/2013 0421   CO2 33 (H) 07/13/2019 0441   CO2 35 (H) 09/10/2013 0421   BUN 27 (H) 07/13/2019 0441   BUN 16 09/28/2017 0928   BUN 12 09/10/2013 0421   CREATININE 1.23 (H) 07/13/2019 0441   CREATININE 1.04 09/10/2013 0421   ALBUMIN 3.6 07/11/2019 1241   ALBUMIN 3.9 09/28/2017 0928   ALBUMIN 2.6 (L) 09/09/2013 0432     Review of Systems  Respiratory: Positive for shortness of breath.   Cardiovascular: Positive for leg swelling.  Neurological: Positive for weakness.  Unless otherwise noted, a complete review of systems is negative.  Physical Exam General: NAD, chronically-ill appearing Cardiovascular: regular rate and rhythm Pulmonary: bilateral rhonchi, 3L/[place]  Abdomen: soft, nontender, + bowel sounds Extremities: bilateral lower edema, no joint deformities Skin: no rashes Neurological: awake, A&O x3, mood appropriate   Prognosis: Guarded   Discharge Planning:  To Be Determined  Recommendations:  DNR/DNI-as requested by patient  Continue with current plan of care per medical team  Ms. Oley remains hopeful for some improvement.   Currently active with outpatient palliative ([facility]) with recommendations for continued support.   PMT will continue to support and follow as needed.    Palliative Performance Scale: PPS 30%                 Patient expressed understanding and was in agreement with this plan.   Thank you for allowing the Palliative Medicine Team to assist in the care of this patient.  Time In: 1245 Time Out: 1340 Time Total: 55 min.   Visit consisted of counseling and education dealing with the complex and emotionally intense issues of symptom management and palliative care in the setting of serious and potentially life-threatening illness.Greater than 50%  of this time was spent counseling and coordinating care related to the above assessment and plan.  Signed by:  Nikki Pickenpack-Cousar, AGPCNP-BC Palliative Medicine Team  Phone:  [phone] Fax: [phone] Pager: [phone] Amion: N. Cousar    

## 2019-07-13 NOTE — Evaluation (Signed)
Physical Therapy Evaluation Patient Details Name: Kimberly Peterson MRN: [id number] DOB: 04-Apr-1930 Today's Date: 07/13/2019   History of Present Illness  Patient is 86 year old female admitted with SOB. Acute on chronic diastolic heart failure. PMH includes: COPD, PE, HTN, HLD, Afib, DM.  Clinical Impression  Patient received in bed, reports she is feeling better, reports she sat up all day yesterday in CCU. Agrees to PT assessment. Patient requires no physical assist with bed mobility, is mod independent With increased time and use of bed rails. Sit to stand with min guard and ambulated a few feet from bed to recliner with RW and min guard assist. She declined further ambulation distance at this time. Performed LE strengthening exercises in recliner. Patient will continue to benefit from skilled PT while here to improve strength and functional independence.        Follow Up Recommendations Home health PT    Equipment Recommendations  None recommended by PT    Recommendations for Other Services       Precautions / Restrictions Precautions Precautions: Fall Restrictions Weight Bearing Restrictions: No      Mobility  Bed Mobility Overal bed mobility: Modified Independent             General bed mobility comments: increased time  Transfers Overall transfer level: Needs assistance Equipment used: Rolling walker (2 wheeled) Transfers: Sit to/from Stand Sit to Stand: Min guard            Ambulation/Gait   Gait Distance (Feet): 4 Feet Assistive device: Rolling walker (2 wheeled) Gait Pattern/deviations: Step-through pattern;Decreased stride length;Shuffle Gait velocity: decreased   General Gait Details: patient able to take a few steps from bed to recliner, declines further ambulation at this time  Stairs            Wheelchair Mobility    Modified Rankin (Stroke Patients Only)       Balance Overall balance assessment: Needs assistance Sitting-balance  support: Feet supported Sitting balance-Leahy Scale: Good     Standing balance support: Bilateral upper extremity supported;During functional activity Standing balance-Leahy Scale: Fair Standing balance comment: reliant on RW                             Pertinent Vitals/Pain Pain Assessment: No/denies pain    Home Living Family/patient expects to be discharged to:: Private residence Living Arrangements: Children Available Help at Discharge: Family;Available 24 hours/day Type of Home: House Home Access: Stairs to enter   [employer] of Steps: 1 step Home Layout: One level Home Equipment: Walker - 4 wheels;Shower seat - built in;Cane - single point;Toilet riser      Prior Function Level of Independence: Independent with assistive device(s)         Comments: Home O2, reports daughter lives with her and assists when needed. If she is feeling good she does all bathing and dressing.     Hand Dominance        Extremity/Trunk Assessment   Upper Extremity Assessment Upper Extremity Assessment: Generalized weakness    Lower Extremity Assessment Lower Extremity Assessment: Generalized weakness    Cervical / Trunk Assessment Cervical / Trunk Assessment: Normal  Communication   Communication: No difficulties  Cognition Arousal/Alertness: Awake/alert Behavior During Therapy: WFL for tasks assessed/performed Overall Cognitive Status: Within Functional Limits for tasks assessed  General Comments      Exercises Other Exercises Other Exercises: LAQ, marching, hip abd/add, ap x 10 reps each bilateral   Assessment/Plan    PT Assessment Patient needs continued PT services  PT Problem List Decreased strength;Decreased activity tolerance;Decreased mobility;Decreased balance;Cardiopulmonary status limiting activity       PT Treatment Interventions Therapeutic exercise;Gait training;Functional  mobility training;Therapeutic activities;Patient/family education    PT Goals (Current goals can be found in the Care Plan section)  Acute Rehab PT Goals Patient Stated Goal: to return home with daughter PT Goal Formulation: With patient Time For Goal Achievement: 07/27/19 Potential to Achieve Goals: Good    Frequency Min 2X/week   Barriers to discharge        Co-evaluation               AM-PAC PT "6 Clicks" Mobility  Outcome Measure Help needed turning from your back to your side while in a flat bed without using bedrails?: A Little Help needed moving from lying on your back to sitting on the side of a flat bed without using bedrails?: A Little Help needed moving to and from a bed to a chair (including a wheelchair)?: A Little Help needed standing up from a chair using your arms (e.g., wheelchair or bedside chair)?: A Little Help needed to walk in hospital room?: A Lot Help needed climbing 3-5 steps with a railing? : A Lot 6 Click Score: 16    End of Session Equipment Utilized During Treatment: Gait belt Activity Tolerance: Patient tolerated treatment well;Patient limited by fatigue Patient left: in chair;with call bell/phone within reach;with chair alarm set Nurse Communication: Mobility status PT Visit Diagnosis: Difficulty in walking, not elsewhere classified (R26.2);Muscle weakness (generalized) (M62.81)    Time: 1010-1031 PT Time Calculation (min) (ACUTE ONLY): 21 min   Charges:   PT Evaluation $PT Eval Moderate Complexity: 1 Mod PT Treatments $Therapeutic Exercise: 8-22 mins        Pia Jedlicka, PT, GCS 07/13/19,11:04 AM

## 2019-07-14 ENCOUNTER — Ambulatory Visit: Payer: Self-pay | Admitting: *Deleted

## 2019-07-14 DIAGNOSIS — E782 Mixed hyperlipidemia: Secondary | ICD-10-CM

## 2019-07-14 LAB — CBC
HCT: 22.8 % — ABNORMAL LOW (ref 36.0–46.0)
Hemoglobin: 6.4 g/dL — ABNORMAL LOW (ref 12.0–15.0)
MCH: 20.8 pg — ABNORMAL LOW (ref 26.0–34.0)
MCHC: 28.1 g/dL — ABNORMAL LOW (ref 30.0–36.0)
MCV: 74.3 fL — ABNORMAL LOW (ref 80.0–100.0)
Platelets: 298 10*3/uL (ref 150–400)
RBC: 3.07 MIL/uL — ABNORMAL LOW (ref 3.87–5.11)
RDW: 17.5 % — ABNORMAL HIGH (ref 11.5–15.5)
WBC: 8 10*3/uL (ref 4.0–10.5)
nRBC: 0.5 % — ABNORMAL HIGH (ref 0.0–0.2)

## 2019-07-14 LAB — BASIC METABOLIC PANEL
Anion gap: 12 (ref 5–15)
BUN: 28 mg/dL — ABNORMAL HIGH (ref 8–23)
CO2: 31 mmol/L (ref 22–32)
Calcium: 8.9 mg/dL (ref 8.9–10.3)
Chloride: 99 mmol/L (ref 98–111)
Creatinine, Ser: 1.35 mg/dL — ABNORMAL HIGH (ref 0.44–1.00)
GFR calc Af Amer: 41 mL/min — ABNORMAL LOW (ref >60)
GFR calc non Af Amer: 35 mL/min — ABNORMAL LOW (ref >60)
Glucose, Bld: 128 mg/dL — ABNORMAL HIGH (ref 70–99)
Potassium: 3.8 mmol/L (ref 3.5–5.1)
Sodium: 142 mmol/L (ref 135–145)

## 2019-07-14 LAB — GLUCOSE, CAPILLARY
Glucose-Capillary: 108 mg/dL — ABNORMAL HIGH (ref 70–99)
Glucose-Capillary: 128 mg/dL — ABNORMAL HIGH (ref 70–99)
Glucose-Capillary: 135 mg/dL — ABNORMAL HIGH (ref 70–99)
Glucose-Capillary: 136 mg/dL — ABNORMAL HIGH (ref 70–99)
Glucose-Capillary: 177 mg/dL — ABNORMAL HIGH (ref 70–99)
Glucose-Capillary: 225 mg/dL — ABNORMAL HIGH (ref 70–99)

## 2019-07-14 LAB — HEMOGLOBIN AND HEMATOCRIT, BLOOD
HCT: 26.9 % — ABNORMAL LOW (ref 36.0–46.0)
Hemoglobin: 7.9 g/dL — ABNORMAL LOW (ref 12.0–15.0)

## 2019-07-14 LAB — PREPARE RBC (CROSSMATCH)

## 2019-07-14 LAB — PROTIME-INR
INR: 3.8 — ABNORMAL HIGH (ref 0.8–1.2)
Prothrombin Time: 37.8 seconds — ABNORMAL HIGH (ref 11.4–15.2)

## 2019-07-14 LAB — ABO/RH: ABO/RH(D): AB POS

## 2019-07-14 LAB — OCCULT BLOOD X 1 CARD TO LAB, STOOL: Fecal Occult Bld: POSITIVE — AB

## 2019-07-14 MED ORDER — SODIUM CHLORIDE 0.9% IV SOLUTION: Freq: Once | INTRAVENOUS | Status: AC

## 2019-07-14 NOTE — Progress Notes (Addendum)
PROGRESS NOTE                                                                                                                                                                                                             Patient Demographics:    Kimberly Peterson, is a 86 y.o. female, DOB - [birth year], [id number]  Admit date - 07/11/2019   Admitting Physician Ivor Costa, MD  Outpatient Primary MD for the patient is [name], MD  LOS - 3  Outpatient Specialists: Cardiology  Chief Complaint  Patient presents with  . Shortness of Breath       Brief Narrative 86 year old obese female with diastolic CHF, chronic kidney disease stage III, A. fib on Coumadin, s/p AV replacement, pacemaker due to CHB, COPD on 2 L home O2, diabetes mellitus and hypertension presented with dyspnea on exertion with findings of acute on chronic diastolic CHF.  In the ED she was in respiratory distress and required BiPAP.    Subjective:   Breathing improving but feels tired.  Assessment  & Plan :    Principal Problem:   Acute on chronic diastolic heart failure (HCC) Acute on chronic respiratory failure with hypoxia (HCC) Diuresing well on IV Lasix with still only 2 L negative balance.  Continue current dose of IV Lasix with strict I's/O.    Active Problems: Iron deficiency anemia Hemoglobin dropped to 6.4 today, baseline of 8.  Check Hemoccult.  No signs of active bleeding.  Continue iron supplement.  Transfuse 1 unit PRBC.  Chronic A. fib Rate controlled on Coreg and Cardizem.  INR supratherapeutic on Coumadin.  Dosing per pharmacy.    Pulmonary embolism ,Chronic ([place]) On Coumadin.    Benign essential HTN Stable.  Continue Cardizem  Chronic obstructive pulmonary disease with chronic hypoxic respiratory failure (HCC) Continue home inhaler.  On 2 L nasal cannula.     Type II diabetes mellitus with renal manifestations  (HCC) Continue insulin and sliding scale coverage.    Acute renal failure superimposed on stage 3a chronic kidney disease (HCC) Likely cardiorenal.  Improved with diuresis.  Hypokalemia Replenished  Goals of care discussion Appreciate palliative care consult.  Patient now made DNR after discussing with patient and her daughter with plan on outpatient follow-up with palliative care (is active with [facility]).    Code Status :  DNR  Family Communication  : Daughter updated on the phone  Disposition Plan  : Home tomorrow if breathing continues to improve and H&H stable.  Barriers For Discharge : Active symptoms,   Consults  : Palliative care  Procedures  : None  DVT Prophylaxis  : Coumadin  Lab Results  Component Value Date   PLT 298 07/14/2019    Antibiotics  :    Anti-infectives (From admission, onward)   None        Objective:   Vitals:   07/13/19 1703 07/13/19 2102 07/14/19 0036 07/14/19 0804  BP: (!) 114/54 (!) 104/44 (!) 116/52 (!) 127/46  Pulse: 74 71 72 69  Resp: 17  17 16   Temp: 98.4 F (36.9 C)  98.2 F (36.8 C) 98.9 F (37.2 C)  TempSrc: Oral  Oral Oral  SpO2: 99%  100% 98%  Weight:      Height:        Wt Readings from Last 3 Encounters:  07/12/19 87.1 kg  06/22/19 83.5 kg  06/07/19 82.9 kg     Intake/Output Summary (Last 24 hours) at 07/14/2019 1414 Last data filed at 07/14/2019 0500 Gross per 24 hour  Intake 240 ml  Output 475 ml  Net -235 ml    Physical exam Elderly female not in distress HEENT: Pallor present, moist mucosa, supple neck Chest: Fine bibasilar crackles, improved from yesterday CVs: Normal S1-S2 GI: Soft, nondistended, nontender Musculoskeletal: Warm, 1+ pitting edema     Data Review:    CBC Recent Labs  Lab 07/11/19 1241 07/13/19 0441 07/14/19 0346  WBC 8.5 9.8 8.0  HGB 7.7* 7.1* 6.4*  HCT 27.2* 25.3* 22.8*  PLT 402* 319 298  MCV 75.8* 74.9* 74.3*  MCH 21.4* 21.0* 20.8*  MCHC 28.3* 28.1* 28.1*   RDW 17.5* 17.4* 17.5*  LYMPHSABS 2.1  --   --   MONOABS 0.9  --   --   EOSABS 0.1  --   --   BASOSABS 0.1  --   --     Chemistries  Recent Labs  Lab 07/11/19 1241 07/12/19 0502 07/13/19 0441 07/14/19 0346  NA 139 144 142 142  K 3.4* 4.0 3.7 3.8  CL 98 102 100 99  CO2 26 30 33* 31  GLUCOSE 256* 126* 73 128*  BUN 25* 26* 27* 28*  CREATININE 1.40* 1.21* 1.23* 1.35*  CALCIUM 9.0 9.1 9.1 8.9  MG  --  2.3  --   --   AST 25  --   --   --   ALT 12  --   --   --   ALKPHOS 72  --   --   --   BILITOT 0.8  --   --   --    ------------------------------------------------------------------------------------------------------------------ Recent Labs    07/12/19 0502  CHOL 125  HDL 51  LDLCALC 59  TRIG 73  CHOLHDL 2.5    Lab Results  Component Value Date   HGBA1C 5.9 (H) 07/12/2019   ------------------------------------------------------------------------------------------------------------------ No results for input(s): TSH, T4TOTAL, T3FREE, THYROIDAB in the last 72 hours.  Invalid input(s): FREET3 ------------------------------------------------------------------------------------------------------------------ Recent Labs    07/13/19 0441  FERRITIN 11  TIBC 430  IRON 15*    Coagulation profile Recent Labs  Lab 07/11/19 1241 07/12/19 0502 07/13/19 0441 07/14/19 0346  INR 3.8* 3.8* 4.3* 3.8*    No results for input(s): DDIMER in the last 72 hours.  Cardiac Enzymes No results for input(s): CKMB, TROPONINI, MYOGLOBIN in the last  168 hours.  Invalid input(s): CK ------------------------------------------------------------------------------------------------------------------    Component Value Date/Time   BNP 735.0 (H) 07/11/2019 1242    Inpatient Medications  Scheduled Meds: . sodium chloride   Intravenous Once  . atorvastatin  10 mg Oral Daily  . carvedilol  6.25 mg Oral BID WC  . Chlorhexidine Gluconate Cloth  6 each Topical Daily  .  dextromethorphan-guaiFENesin  1 tablet Oral BID  . diltiazem  240 mg Oral Daily  . docusate sodium  100 mg Oral Daily  . ferrous sulfate  325 mg Oral BID WC  . furosemide  80 mg Intravenous Q12H  . hydrALAZINE  37.5 mg Oral TID  . insulin aspart  0-5 Units Subcutaneous QHS  . insulin aspart  0-9 Units Subcutaneous TID WC  . insulin aspart protamine- aspart  20 Units Subcutaneous Q breakfast   And  . insulin aspart protamine- aspart  16 Units Subcutaneous Q supper  . ipratropium  2 puff Inhalation Q4H  . mouth rinse  15 mL Mouth Rinse BID  . pantoprazole  40 mg Oral Daily  . potassium chloride  10 mEq Oral Daily  . senna-docusate  1 tablet Oral QHS  . sodium chloride flush  3 mL Intravenous Q12H  . Warfarin - Pharmacist Dosing Inpatient   Does not apply q1800   Continuous Infusions: . sodium chloride     PRN Meds:.sodium chloride, acetaminophen, albuterol, bisacodyl, hydrALAZINE, magnesium hydroxide, ondansetron (ZOFRAN) IV, sodium chloride flush  Micro Results Recent Results (from the past 240 hour(s))  Respiratory Panel by RT PCR (Flu A&B, Covid) - Nasopharyngeal Swab     Status: None   Collection Time: 07/11/19  1:07 PM   Specimen: Nasopharyngeal Swab  Result Value Ref Range Status   SARS Coronavirus 2 by RT PCR NEGATIVE NEGATIVE Final    Comment: (NOTE) SARS-CoV-2 target nucleic acids are NOT DETECTED. The SARS-CoV-2 RNA is generally detectable in upper respiratoy specimens during the acute phase of infection. The lowest concentration of SARS-CoV-2 viral copies this assay can detect is 131 copies/mL. A negative result does not preclude SARS-Cov-2 infection and should not be used as the sole basis for treatment or other patient management decisions. A negative result may occur with  improper specimen collection/handling, submission of specimen other than nasopharyngeal swab, presence of viral mutation(s) within the areas targeted by this assay, and inadequate number of  viral copies (<131 copies/mL). A negative result must be combined with clinical observations, patient history, and epidemiological information. The expected result is Negative. Fact Sheet for Patients:  PinkCheek.be Fact Sheet for Healthcare Providers:  GravelBags.it This test is not yet ap proved or cleared by the [place] FDA and  has been authorized for detection and/or diagnosis of SARS-CoV-2 by FDA under an Emergency Use Authorization (EUA). This EUA will remain  in effect (meaning this test can be used) for the duration of the COVID-19 declaration under Section 564(b)(1) of the Act, 21 U.S.C. section 360bbb-3(b)(1), unless the authorization is terminated or revoked sooner.    Influenza A by PCR NEGATIVE NEGATIVE Final   Influenza B by PCR NEGATIVE NEGATIVE Final    Comment: (NOTE) The Xpert Xpress SARS-CoV-2/FLU/RSV assay is intended as an aid in  the diagnosis of influenza from Nasopharyngeal swab specimens and  should not be used as a sole basis for treatment. Nasal washings and  aspirates are unacceptable for Xpert Xpress SARS-CoV-2/FLU/RSV  testing. Fact Sheet for Patients: PinkCheek.be Fact Sheet for Healthcare Providers: GravelBags.it This test is not  yet approved or cleared by the [place] and  has been authorized for detection and/or diagnosis of SARS-CoV-2 by  FDA under an Emergency Use Authorization (EUA). This EUA will remain  in effect (meaning this test can be used) for the duration of the  Covid-19 declaration under Section 564(b)(1) of the Act, 21  U.S.C. section 360bbb-3(b)(1), unless the authorization is  terminated or revoked. Performed at [facility], [address] [place], [place] [address]   MRSA PCR Screening     Status: None   Collection Time: 07/11/19  6:54 PM   Specimen: Nasal Mucosa; Nasopharyngeal  Result  Value Ref Range Status   MRSA by PCR NEGATIVE NEGATIVE Final    Comment:        The GeneXpert MRSA Assay (FDA approved for NASAL specimens only), is one component of a comprehensive MRSA colonization surveillance program. It is not intended to diagnose MRSA infection nor to guide or monitor treatment for MRSA infections. Performed at [facility], [address] [place], [place] [address]     Radiology Reports DG Chest Portable 1 View  Result Date: 07/11/2019 CLINICAL DATA:  Worsening shortness of breath over the past 24 hours. EXAM: PORTABLE CHEST 1 VIEW COMPARISON:  Single-view of the chest 04/27/2019, 03/01/2019 and 12/03/2015. FINDINGS: Cardiomegaly and pulmonary vascular congestion are unchanged. Atherosclerosis is noted. Pacing device is in place. No acute or focal bony abnormality. IMPRESSION: 1. No change in cardiomegaly and pulmonary vascular congestion. 2. Atherosclerosis. Electronically Signed   By: Inge Rise M.D.   On: 07/11/2019 13:31   ECHOCARDIOGRAM COMPLETE  Result Date: 07/12/2019   ECHOCARDIOGRAM REPORT   Patient Name:   MODESTY BELGRAVE Date of Exam: 07/12/2019 Medical Rec #:  [id number]       Height:       62.0 in Accession #:    [id number]      Weight:       192.0 lb Date of Birth:  April 10, 1930        BSA:          1.88 m Patient Age:    75 years        BP:           136/76 mmHg Patient Gender: F               HR:           72 bpm. Exam Location:  [facility] Procedure: 2D Echo, Color Doppler and Cardiac Doppler Indications:     I50.31 CHF-Acute Diastolic  History:         Patient has prior history of Echocardiogram examinations, most                  recent 04/29/2019. CHF, COPD and CKD; Risk Factors:Hypertension                  and Diabetes.  Sonographer:     Charmayne Sheer RDCS (AE) Referring Phys:  Baker Janus Soledad Gerlach NIU Diagnosing Phys: Yolonda Kida MD  Sonographer Comments: Suboptimal parasternal window and no subcostal window. TDS due to pt sitting in recliner. Pt did  not have IV access. IMPRESSIONS  1. Left ventricular ejection fraction, by visual estimation, is 60 to 65%. The left ventricle has normal function. Left ventricular septal wall thickness was normal. Normal left ventricular posterior wall thickness. There is no left ventricular hypertrophy.  2. The left ventricle has no regional wall motion abnormalities.  3. Global right ventricle  has normal systolic function.The right ventricular size is normal. No increase in right ventricular wall thickness.  4. Left atrial size was normal.  5. Right atrial size was normal.  6. Moderate mitral annular calcification.  7. The mitral valve is myxomatous. Mild mitral valve regurgitation. Moderate mitral stenosis.  8. The tricuspid valve is grossly normal.  9. The aortic valve is grossly normal. Aortic valve regurgitation is trivial. Mild to moderate aortic valve sclerosis/calcification without any evidence of aortic stenosis. 10. The pulmonic valve was grossly normal. Pulmonic valve regurgitation is not visualized. 11. Severely elevated pulmonary artery systolic pressure. 12. A pacer wire is visualized. 13. Left ventricular ejection fraction by PLAX is is 62 % 14. The atrial septum is grossly normal. FINDINGS  Left Ventricle: Left ventricular ejection fraction, by visual estimation, is 60 to 65%. Left ventricular ejection fraction by PLAX is is 62 % The left ventricle has normal function. The left ventricle has no regional wall motion abnormalities. Normal left ventricular posterior wall thickness. There is no left ventricular hypertrophy. Right Ventricle: The right ventricular size is normal. No increase in right ventricular wall thickness. Global RV systolic function is has normal systolic function. The tricuspid regurgitant velocity is 3.89 m/s, and with an assumed right atrial pressure  of 10 mmHg, the estimated right ventricular systolic pressure is severely elevated at 70.5 mmHg. Left Atrium: Left atrial size was normal in  size. Right Atrium: Right atrial size was normal in size Pericardium: There is no evidence of pericardial effusion. Mitral Valve: The mitral valve is myxomatous. Moderate mitral annular calcification. Mild mitral valve regurgitation. Moderate mitral valve stenosis by observation. MV peak gradient, 14.0 mmHg. Trivial MS. Tricuspid Valve: The tricuspid valve is grossly normal. Tricuspid valve regurgitation mild-moderate. Aortic Valve: The aortic valve is grossly normal. Aortic valve regurgitation is trivial. Mild to moderate aortic valve sclerosis/calcification is present, without any evidence of aortic stenosis. Aortic valve mean gradient measures 10.0 mmHg. Aortic valve peak gradient measures 17.0 mmHg. Aortic valve area, by VTI measures 0.99 cm. Pulmonic Valve: The pulmonic valve was grossly normal. Pulmonic valve regurgitation is not visualized. Pulmonic regurgitation is not visualized. Aorta: The aortic root is normal in size and structure. IAS/Shunts: The atrial septum is grossly normal. Additional Comments: A pacer wire is visualized.  LEFT VENTRICLE PLAX 2D LV EF:         Left            Diastology                ventricular     LV e' lateral:   3.59 cm/s                ejection        LV E/e' lateral: 45.7                fraction by                PLAX is is                62 % LVIDd:         4.08 cm LVIDs:         2.72 cm LV PW:         0.84 cm LV IVS:        1.03 cm LVOT diam:     1.80 cm LV SV:         46 ml LV SV Index:   23.02 LVOT Area:  2.54 cm  LEFT ATRIUM             Index LA diam:        4.90 cm 2.61 cm/m LA Vol (A2C):   41.6 ml 22.14 ml/m LA Vol (A4C):   79.6 ml 42.37 ml/m LA Biplane Vol: 58.3 ml 31.03 ml/m  AORTIC VALVE                    PULMONIC VALVE AV Area (Vmax):    1.31 cm     PV Vmax:       0.83 m/s AV Area (Vmean):   1.28 cm     PV Vmean:      62.600 cm/s AV Area (VTI):     0.99 cm     PV VTI:        0.196 m AV Vmax:           206.00 cm/s  PV Peak grad:  2.8 mmHg AV Vmean:           147.000 cm/s PV Mean grad:  2.0 mmHg AV VTI:            0.471 m AV Peak Grad:      17.0 mmHg AV Mean Grad:      10.0 mmHg LVOT Vmax:         106.00 cm/s LVOT Vmean:        73.700 cm/s LVOT VTI:          0.184 m LVOT/AV VTI ratio: 0.39  AORTA Ao Root diam: 3.00 cm MITRAL VALVE                         TRICUSPID VALVE MV Area (PHT): 2.50 cm              TR Peak grad:   60.5 mmHg MV Peak grad:  14.0 mmHg             TR Vmax:        433.00 cm/s MV Mean grad:  6.0 mmHg MV Vmax:       1.87 m/s              SHUNTS MV Vmean:      117.0 cm/s            Systemic VTI:  0.18 m MV VTI:        0.59 m                Systemic Diam: 1.80 cm MV PHT:        87.87 msec MV Decel Time: 303 msec MV E velocity: 164.00 cm/s 103 cm/s MV A velocity: 119.00 cm/s 70.3 cm/s MV E/A ratio:  1.38        1.5  Dwayne D Callwood MD Electronically signed by Yolonda Kida MD Signature Date/Time: 07/12/2019/5:05:28 PM    Final     Time Spent in minutes  35   Liset Mcmonigle M.D on 07/14/2019 at 2:14 PM  Between 7am to 7pm - Pager - [phone]  After 7pm go to [email or web address] - password [facility]  [facility] -  Office  [phone]

## 2019-07-14 NOTE — Progress Notes (Signed)
Physical Therapy Treatment Patient Details Name: Kimberly Peterson MRN: [id number] DOB: 05/23/1930 Today's Date: 07/14/2019    History of Present Illness Patient is 86 year old female admitted with SOB. Acute on chronic diastolic heart failure. PMH includes: COPD, PE, HTN, HLD, Afib, DM.    PT Comments    Patient received in bed, reports she is waiting for nurse to come back to get her up to bsc. Also reports she is getting blood today. After checking in with RN, I assisted patient up to bsc with min assist.  After using commode, she ambulated 20 feet with rolling walker and min assist to direct walker around obstacles in room due to poor vision. Patient will continue to benefit from skilled PT to improve strength and functional independence with mobility.     Follow Up Recommendations  Home health PT     Equipment Recommendations  None recommended by PT    Recommendations for Other Services       Precautions / Restrictions Precautions Precautions: Fall Restrictions Weight Bearing Restrictions: No    Mobility  Bed Mobility Overal bed mobility: Modified Independent             General bed mobility comments: increased time  Transfers Overall transfer level: Needs assistance Equipment used: Rolling walker (2 wheeled) Transfers: Sit to/from Stand Sit to Stand: Min assist            Ambulation/Gait Ambulation/Gait assistance: Min assist Gait Distance (Feet): 20 Feet Assistive device: Rolling walker (2 wheeled) Gait Pattern/deviations: Step-through pattern;Decreased stride length;Shuffle Gait velocity: decreased   General Gait Details: patient requires assistance to direct rolling walker and avoid obstacles due to low vision.   Stairs             Wheelchair Mobility    Modified Rankin (Stroke Patients Only)       Balance Overall balance assessment: Needs assistance Sitting-balance support: Feet supported Sitting balance-Leahy Scale: Good      Standing balance support: Bilateral upper extremity supported;During functional activity Standing balance-Leahy Scale: Fair Standing balance comment: reliant on RW                            Cognition Arousal/Alertness: Awake/alert Behavior During Therapy: WFL for tasks assessed/performed Overall Cognitive Status: Within Functional Limits for tasks assessed                                        Exercises      General Comments        Pertinent Vitals/Pain Pain Assessment: No/denies pain    Home Living                      Prior Function            PT Goals (current goals can now be found in the care plan section) Acute Rehab PT Goals Patient Stated Goal: to return home with daughter PT Goal Formulation: With patient Time For Goal Achievement: 07/27/19 Potential to Achieve Goals: Good Progress towards PT goals: Progressing toward goals    Frequency    Min 2X/week      PT Plan Current plan remains appropriate    Co-evaluation              AM-PAC PT "6 Clicks" Mobility   Outcome Measure  Help needed turning from  your back to your side while in a flat bed without using bedrails?: A Little Help needed moving from lying on your back to sitting on the side of a flat bed without using bedrails?: A Little Help needed moving to and from a bed to a chair (including a wheelchair)?: A Little Help needed standing up from a chair using your arms (e.g., wheelchair or bedside chair)?: A Little Help needed to walk in hospital room?: A Little Help needed climbing 3-5 steps with a railing? : A Lot 6 Click Score: 17    End of Session Equipment Utilized During Treatment: Gait belt Activity Tolerance: Patient tolerated treatment well Patient left: in chair;with chair alarm set;with call bell/phone within reach Nurse Communication: Mobility status PT Visit Diagnosis: Difficulty in walking, not elsewhere classified (R26.2);Muscle  weakness (generalized) (M62.81)     Time: [id number] PT Time Calculation (min) (ACUTE ONLY): 30 min  Charges:  $Gait Training: 8-22 mins $Therapeutic Activity: 8-22 mins

## 2019-07-14 NOTE — Progress Notes (Signed)
Daily Progress Note   Patient Name: Kimberly Peterson       Date: 07/14/2019 DOB: 17-Mar-1930  Age: 86 y.o. MRN#: [id number] Attending Physician: Louellen Molder, MD Primary Care Physician: [name], MD Admit Date: 07/11/2019  Reason for Consultation/Follow-up: Establishing goals of care  Subjective: Patient sitting up in chair waiting for lunch. She denies pain or shortness of breath. No acute distress noted. Reports she is feeling much better and that she had a good night's sleep. Does report some fatigue emphasizing she has not been moving around as much as she would have been at home.   She is hopeful to get home tomorrow as she is concerned about being stuck in the hospital if it snows. Support given.   Reviewed goals of care discussion from yesterday. Patient verbalized goals remain for outpatient palliative support and home health. Confirms DNR/DNI wishes.   Length of Stay: 3  Current Medications: Scheduled Meds:  . sodium chloride   Intravenous Once  . atorvastatin  10 mg Oral Daily  . carvedilol  6.25 mg Oral BID WC  . Chlorhexidine Gluconate Cloth  6 each Topical Daily  . dextromethorphan-guaiFENesin  1 tablet Oral BID  . diltiazem  240 mg Oral Daily  . docusate sodium  100 mg Oral Daily  . ferrous sulfate  325 mg Oral BID WC  . furosemide  80 mg Intravenous Q12H  . hydrALAZINE  37.5 mg Oral TID  . insulin aspart  0-5 Units Subcutaneous QHS  . insulin aspart  0-9 Units Subcutaneous TID WC  . insulin aspart protamine- aspart  20 Units Subcutaneous Q breakfast   And  . insulin aspart protamine- aspart  16 Units Subcutaneous Q supper  . ipratropium  2 puff Inhalation Q4H  . mouth rinse  15 mL Mouth Rinse BID  . pantoprazole  40 mg Oral Daily  . potassium chloride  10 mEq  Oral Daily  . senna-docusate  1 tablet Oral QHS  . sodium chloride flush  3 mL Intravenous Q12H  . Warfarin - Pharmacist Dosing Inpatient   Does not apply q1800    Continuous Infusions: . sodium chloride      PRN Meds: sodium chloride, acetaminophen, albuterol, bisacodyl, hydrALAZINE, magnesium hydroxide, ondansetron (ZOFRAN) IV, sodium chloride flush  Physical Exam       -  NAD, awake, A&O x3 -  Bilateral crackles -RRR, bilateral lower extremity edema (improved)  Vital Signs: BP (!) 127/46 (BP Location: Right Arm)   Pulse 69   Temp 98.9 F (37.2 C) (Oral)   Resp 16   Ht 5\' 2"  (1.575 m)   Wt 87.1 kg   SpO2 98%   BMI 35.12 kg/m  SpO2: SpO2: 98 % O2 Device: O2 Device: Nasal Cannula O2 Flow Rate: O2 Flow Rate (L/min): 2 L/min  Intake/output summary:   Intake/Output Summary (Last 24 hours) at 07/14/2019 1433 Last data filed at 07/14/2019 0500 Gross per 24 hour  Intake 240 ml  Output 475 ml  Net -235 ml   LBM: Last BM Date: 07/12/19 Baseline Weight: Weight: 88.6 kg Most recent weight: Weight: 87.1 kg       Palliative Assessment/Data: PPS 30%      Patient Active Problem List   Diagnosis Date Noted  . Type II diabetes mellitus with renal manifestations ([place]) 07/11/2019  . Microcytic anemia 07/11/2019  . Lactic acidosis 07/11/2019  . Hypokalemia 07/11/2019  . Elevated troponin 07/11/2019  . Acute renal failure superimposed on stage 3a chronic kidney disease ([place]) 07/11/2019  . Acute on chronic diastolic heart failure ([place]) 04/27/2019  . Acute and chronic respiratory failure with hypoxia ([place]) 03/01/2019  . Acute respiratory failure with hypoxia ([place]) 02/07/2019  . Type 2 diabetes with peripheral circulatory disorder, controlled ([place]) 10/17/2018  . Complete heart block ([place]) 09/09/2018  . Severe aortic valve stenosis 09/09/2018  . Lymphedema 08/12/2018  . Uncontrolled type 2 diabetes mellitus with hyperglycemia ([place]) 02/25/2018  . Atrial fibrillation ([place])  02/25/2018  . Encounter for current long-term use of anticoagulants 02/25/2018  . Dependence on supplemental oxygen 02/25/2018  . Lower extremity edema 02/25/2018  . CHF (congestive heart failure) ([place]) 12/04/2015  . Diabetes mellitus ([place]) 12/04/2015  . Chronic obstructive pulmonary disease ([place]) 09/20/2015  . Diastolic CHF, acute on chronic (HCC) 09/19/2015  . Benign essential HTN 09/19/2015  . Controlled diabetes mellitus without complication, with long-term current use of insulin ([place]) 09/19/2015  . Hyperlipidemia, mixed 09/04/2014  . Pulmonary embolism ([place]) 09/23/2013    Palliative Care Assessment & Plan   Recommendations/Plan:  Continue current plan of care per medical team.  Patient hopeful for discharge tomorrow.   Outpatient Palliative will continue to follow in the home at patient's request.   Goals of Care and Additional Recommendations:  Limitations on Scope of Treatment: Full Scope Treatment  Code Status:    Code Status Orders  (From admission, onward)         Start     Ordered   07/13/19 1326  Do not attempt resuscitation (DNR)  Continuous    Question Answer Comment  In the event of cardiac or respiratory ARREST Do not call a "code blue"   In the event of cardiac or respiratory ARREST Do not perform Intubation, CPR, defibrillation or ACLS   In the event of cardiac or respiratory ARREST Use medication by any route, position, wound care, and other measures to relive pain and suffering. May use oxygen, suction and manual treatment of airway obstruction as needed for comfort.      07/13/19 1326        Code Status History    Date Active Date Inactive Code Status Order ID Comments User Context   07/11/2019 1849 07/13/2019 1326 Full Code [id number]  Ivor Costa, MD Inpatient   04/27/2019 1715 05/01/2019 1811 Full Code [id number]  Harrie Foreman, MD  Inpatient   03/01/2019 1516 03/05/2019 1741 Full Code [id number]  Demetrios Loll, MD Inpatient   02/07/2019 1126 02/09/2019  1939 Full Code [id number]  Lang Snow, NP ED   08/02/2018 1745 08/05/2018 1704 Full Code [id number]  Fritzi Mandes, MD Inpatient   12/04/2015 0346 12/06/2015 1852 Full Code [id number]  Quintella Baton, MD Inpatient   09/19/2015 1427 09/21/2015 2113 Full Code [id number]  Robbie Lis, MD Inpatient   09/23/2013 1544 09/29/2013 1741 Full Code [id number]  Rush Farmer, MD ED   Advance Care Planning Activity       Prognosis:  Guarded  Discharge Planning:  Home with Palliative Services  Care plan was discussed with patient and RN.   Thank you for allowing the Palliative Medicine Team to assist in the care of this patient.  Total Time: 35 min.   Greater than 50%  of this time was spent counseling and coordinating care related to the above assessment and plan.  Alda Lea, AGPCNP-BC Palliative Medicine Team   Please contact Palliative Medicine Team phone at [phone] for questions and concerns.

## 2019-07-14 NOTE — Consult Note (Addendum)
ANTICOAGULATION CONSULT NOTE  Pharmacy Consult for warfarin dosing Indication: atrial fibrillation  Patient Measurements: Height: 5\' 2"  (157.5 cm) Weight: 192 lb 0.3 oz (87.1 kg) IBW/kg (Calculated) : 50.1  Vital Signs: Temp: 98.2 F (36.8 C) (01/07 0036) Temp Source: Oral (01/07 0036) BP: 116/52 (01/07 0036) Pulse Rate: 72 (01/07 0036)  Labs: Recent Labs    07/11/19 1241 07/11/19 1750 07/12/19 0502 07/13/19 0441 07/14/19 0346  HGB 7.7*  --   --  7.1* 6.4*  HCT 27.2*  --   --  25.3* 22.8*  PLT 402*  --   --  319 298  LABPROT 37.3*  --  37.6* 40.9* 37.8*  INR 3.8*  --  3.8* 4.3* 3.8*  CREATININE 1.40*  --  1.21* 1.23* 1.35*  TROPONINIHS 54* 165*  --   --   --     Estimated Creatinine Clearance: 30.1 mL/min (A) (by C-G formula based on SCr of 1.35 mg/dL (H)).   Medical History: Past Medical History:  Diagnosis Date  . CHF (congestive heart failure) ([place])   . Chronic kidney disease   . COPD (chronic obstructive pulmonary disease) ([place])   . Diabetes mellitus without complication ([place])   . Hypertension     Medications:  Scheduled:  . sodium chloride   Intravenous Once  . aspirin EC  81 mg Oral Daily  . atorvastatin  10 mg Oral Daily  . carvedilol  6.25 mg Oral BID WC  . Chlorhexidine Gluconate Cloth  6 each Topical Daily  . dextromethorphan-guaiFENesin  1 tablet Oral BID  . diltiazem  240 mg Oral Daily  . docusate sodium  100 mg Oral Daily  . ferrous sulfate  325 mg Oral BID WC  . furosemide  80 mg Intravenous Q12H  . hydrALAZINE  37.5 mg Oral TID  . insulin aspart  0-5 Units Subcutaneous QHS  . insulin aspart  0-9 Units Subcutaneous TID WC  . insulin aspart protamine- aspart  20 Units Subcutaneous Q breakfast   And  . insulin aspart protamine- aspart  16 Units Subcutaneous Q supper  . ipratropium  2 puff Inhalation Q4H  . mouth rinse  15 mL Mouth Rinse BID  . pantoprazole  40 mg Oral Daily  . potassium chloride  10 mEq Oral Daily  . senna-docusate  1  tablet Oral QHS  . sodium chloride flush  3 mL Intravenous Q12H  . Warfarin - Pharmacist Dosing Inpatient   Does not apply q1800    Assessment: 86 year old female with PMH afib and PE on warfarin PTA admitted with AECHF. Home dose of warfarin 5 mg daily except 7.5 mg on Thursday. Pharmacy consulted to continue warfarin in this patient. There are no new DDIs since the previous note.  Date   INR   Dose 1/4   3.8   HOLD 1/5   3.8   HOLD 1/6   4.3   HOLD 1/7                               3.8  Goal of Therapy:  INR 2-3 Monitor platelets by anticoagulation protocol: Yes   Plan:   INR decreased slightly.  Hgb 6.4 (from 7.1)  Plt 298 (from 319)  Will hold warfarin and monitor INR trend for appropriate dosing  CBC/INR daily for now  Lu Duffel, PharmD, BCPS Clinical Pharmacist 07/14/2019 7:36 AM

## 2019-07-15 DIAGNOSIS — D62 Acute posthemorrhagic anemia: Secondary | ICD-10-CM

## 2019-07-15 LAB — BASIC METABOLIC PANEL
Anion gap: 11 (ref 5–15)
BUN: 25 mg/dL — ABNORMAL HIGH (ref 8–23)
CO2: 32 mmol/L (ref 22–32)
Calcium: 8.6 mg/dL — ABNORMAL LOW (ref 8.9–10.3)
Chloride: 99 mmol/L (ref 98–111)
Creatinine, Ser: 1.35 mg/dL — ABNORMAL HIGH (ref 0.44–1.00)
GFR calc Af Amer: 41 mL/min — ABNORMAL LOW (ref >60)
GFR calc non Af Amer: 35 mL/min — ABNORMAL LOW (ref >60)
Glucose, Bld: 103 mg/dL — ABNORMAL HIGH (ref 70–99)
Potassium: 3.5 mmol/L (ref 3.5–5.1)
Sodium: 142 mmol/L (ref 135–145)

## 2019-07-15 LAB — CBC
HCT: 26.3 % — ABNORMAL LOW (ref 36.0–46.0)
Hemoglobin: 7.8 g/dL — ABNORMAL LOW (ref 12.0–15.0)
MCH: 22.3 pg — ABNORMAL LOW (ref 26.0–34.0)
MCHC: 29.7 g/dL — ABNORMAL LOW (ref 30.0–36.0)
MCV: 75.4 fL — ABNORMAL LOW (ref 80.0–100.0)
Platelets: 274 10*3/uL (ref 150–400)
RBC: 3.49 MIL/uL — ABNORMAL LOW (ref 3.87–5.11)
RDW: 17.6 % — ABNORMAL HIGH (ref 11.5–15.5)
WBC: 8.8 10*3/uL (ref 4.0–10.5)
nRBC: 0.2 % (ref 0.0–0.2)

## 2019-07-15 LAB — TYPE AND SCREEN
ABO/RH(D): AB POS
Antibody Screen: NEGATIVE
Unit division: 0

## 2019-07-15 LAB — BPAM RBC
Blood Product Expiration Date: 202101262359
ISSUE DATE / TIME: 202101071512
Unit Type and Rh: 6200

## 2019-07-15 LAB — GLUCOSE, CAPILLARY
Glucose-Capillary: 111 mg/dL — ABNORMAL HIGH (ref 70–99)
Glucose-Capillary: 147 mg/dL — ABNORMAL HIGH (ref 70–99)
Glucose-Capillary: 188 mg/dL — ABNORMAL HIGH (ref 70–99)
Glucose-Capillary: 198 mg/dL — ABNORMAL HIGH (ref 70–99)

## 2019-07-15 LAB — PROTIME-INR
INR: 2.7 — ABNORMAL HIGH (ref 0.8–1.2)
Prothrombin Time: 28.3 seconds — ABNORMAL HIGH (ref 11.4–15.2)

## 2019-07-15 MED ORDER — WARFARIN SODIUM 2.5 MG PO TABS
2.5000 mg | ORAL_TABLET | Freq: Once | ORAL | Status: DC
  Filled 2019-07-15: qty 1

## 2019-07-15 MED ORDER — FUROSEMIDE 40 MG PO TABS
40.0000 mg | ORAL_TABLET | Freq: Two times a day (BID) | ORAL | Status: DC
  Administered 2019-07-16 – 2019-07-17 (×3): 40 mg via ORAL
  Filled 2019-07-15 (×3): qty 1

## 2019-07-15 MED ORDER — WARFARIN - PHARMACIST DOSING INPATIENT: Freq: Every day | Status: DC

## 2019-07-15 NOTE — Progress Notes (Signed)
Physical Therapy Treatment Patient Details Name: Kimberly Peterson MRN: [id number] DOB: 25-Sep-1929 Today's Date: 07/15/2019    History of Present Illness Patient is 86 year old female admitted with SOB. Acute on chronic diastolic heart failure. PMH includes: COPD, PE, HTN, HLD, Afib, DM.    PT Comments    Pt received sitting up in recliner resting. Pt easily aroused and agreeable to PT. Pt performed seated LE therex requiring min cuing for correct performance. Pt required min guard A for STS transfer and stand pivot transfer recliner <> BSC. Pt requires min A for ambulation for RW mgt especially during turns and around obstacles due to low vision. Pt reporting feeling very fatigued after therex and ambulation. Pt on 2L [place] throughout session. O2 sats mid 90s pre ambulation and dropped to 86% post ambulation. O2 sats increased back to above 90% after ~22min seated rest with cuing for deep breathing. Pt is progressing slowly towards PT goals. Pt presents with strength, balance, power, and endurance deficits and would benefit from further acute PT. Home health remains appropriate following hospital discharge.     Follow Up Recommendations  Home health PT     Equipment Recommendations  None recommended by PT    Recommendations for Other Services       Precautions / Restrictions Precautions Precautions: Fall Restrictions Weight Bearing Restrictions: No    Mobility  Bed Mobility               General bed mobility comments: pt received sitting up in recliner and ended session in recliner as well  Transfers Overall transfer level: Needs assistance Equipment used: Rolling walker (2 wheeled) Transfers: Sit to/from [facility] Sit to Stand: Min guard Stand pivot transfers: Min guard       General transfer comment: min guard to stand from recliner, cuing for hand placement with RW, slow but steady with transfer today, min guard without AD for stand pivot to/from recliner  <> BSC for toileting  Ambulation/Gait Ambulation/Gait assistance: Min assist Gait Distance (Feet): 20 Feet Assistive device: Rolling walker (2 wheeled) Gait Pattern/deviations: Step-through pattern;Decreased stride length;Shuffle Gait velocity: decreased   General Gait Details: pt ambulated from recliner to room door and back, min A for RW mgt especially with turns and around obstacles due to low vision, steady with no overt LOB, O2 sats post ambulation dropped to 86% on 2L O2 increasing to 91% after ~61min seated rest and cuing for breathing   Stairs             Wheelchair Mobility    Modified Rankin (Stroke Patients Only)       Balance Overall balance assessment: Needs assistance Sitting-balance support: Feet supported Sitting balance-Leahy Scale: Good     Standing balance support: Bilateral upper extremity supported;During functional activity Standing balance-Leahy Scale: Fair Standing balance comment: reliant on RW for ambulation                            Cognition Arousal/Alertness: Awake/alert Behavior During Therapy: WFL for tasks assessed/performed Overall Cognitive Status: Within Functional Limits for tasks assessed                                        Exercises Total Joint Exercises Ankle Circles/Pumps: AROM;Both;15 reps Long Arc Quad: AROM;Both;15 reps Marching in Standing: AROM;Both;15 reps;Seated General Exercises - Lower Extremity Toe  Raises: AROM;Both;15 reps Heel Raises: AROM;Both;15 reps    General Comments        Pertinent Vitals/Pain Pain Assessment: No/denies pain    Home Living                      Prior Function            PT Goals (current goals can now be found in the care plan section) Progress towards PT goals: Progressing toward goals    Frequency    Min 2X/week      PT Plan Current plan remains appropriate    Co-evaluation              AM-PAC PT "6 Clicks"  Mobility   Outcome Measure  Help needed turning from your back to your side while in a flat bed without using bedrails?: A Little Help needed moving from lying on your back to sitting on the side of a flat bed without using bedrails?: A Little Help needed moving to and from a bed to a chair (including a wheelchair)?: A Little Help needed standing up from a chair using your arms (e.g., wheelchair or bedside chair)?: A Little Help needed to walk in hospital room?: A Little Help needed climbing 3-5 steps with a railing? : A Lot 6 Click Score: 17    End of Session Equipment Utilized During Treatment: Gait belt Activity Tolerance: Patient tolerated treatment well;Patient limited by fatigue Patient left: in chair;with call bell/phone within reach Nurse Communication: Mobility status PT Visit Diagnosis: Difficulty in walking, not elsewhere classified (R26.2);Muscle weakness (generalized) (M62.81)     Time: [id number] PT Time Calculation (min) (ACUTE ONLY): 17 min  Charges:  $Therapeutic Exercise: 8-22 mins                     Jodi Kappes PT, DPT 12:24 PM,07/15/19 [phone]    Kimberly Peterson 07/15/2019, 12:18 PM

## 2019-07-15 NOTE — Consult Note (Signed)
Kimberly Antigua, MD [address] [place], [place], [place], [address] [address], [place], [place], [place], [address] Phone: [phone]  Fax: [phone]  Consultation  Referring Provider:     Dr. Clementeen Graham Primary Care Physician:  [name], MD Reason for Consultation:    Iron deficiency anemia  Date of Admission:  07/11/2019 Date of Consultation:  07/15/2019         HPI:   Kimberly Peterson is a 86 y.o. female admitted with acute on chronic CHF with GI being consulted for iron deficiency anemia.  Patient denies any episodes of bleeding.  No nausea or vomiting or abdominal pain or diarrhea.  Patient initially required BiPAP at admission, now on room air.  Hemoglobin dropped to 6.4 yesterday, compared to 7.7 on admission.  It is microcytic.  Patient is noted to have anemia with declining hemoglobin since August 2020  She is also on Coumadin with history of A. fib and aortic valve replacement  No prior upper endoscopy.  Colonoscopy in 2005 for screening with 3 subcentimeter polyps removed, diverticulosis and internal hemorrhoids reported.  Pathology report not available.  Past Medical History:  Diagnosis Date  . CHF (congestive heart failure) ([place])   . Chronic kidney disease   . COPD (chronic obstructive pulmonary disease) ([place])   . Diabetes mellitus without complication ([place])   . Hypertension     Past Surgical History:  Procedure Laterality Date  . aoric valve replacemet      [employer] Jude  . CATARACT EXTRACTION, BILATERAL    . PACEMAKER PLACEMENT    . VALVE REPLACEMENT      Prior to Admission medications   Medication Sig Start Date End Date Taking? Authorizing Provider  aspirin 81 MG tablet Take 81 mg by mouth daily.   Yes [provider]  atorvastatin (LIPITOR) 10 MG tablet TAKE 1 TABLET BY MOUTH ONCE DAILY FOR CHOLESTEROL Patient taking differently: Take 10 mg by mouth daily.  12/06/18  Yes [place], Audie Clear, NP  CARTIA XT 240 MG 24 hr capsule Take 1  capsule by mouth once daily Patient taking differently: Take 240 mg by mouth daily.  04/06/19  Yes [name], MD  carvedilol (COREG) 6.25 MG tablet Take 1 tablet (6.25 mg total) by mouth 2 (two) times daily with a meal. 03/22/19 07/11/19 Yes [name], MD  hydrALAZINE (APRESOLINE) 25 MG tablet TAKE 1 1/2 TABLETS BY MOUTH THREE TIMES DAILY FOR BLOOD PRESSURE Patient taking differently: Take 37.5 mg by mouth 3 (three) times daily. TAKE 1 1/2 TABLETS BY MOUTH THREE TIMES DAILY FOR BLOOD PRESSURE 06/08/19  Yes [name], MD  insulin aspart protamine- aspart (NOVOLOG MIX 70/30) (70-30) 100 UNIT/ML injection Inject 21-34 Units into the skin See admin instructions. Inject 32u under the skin every morning and inject 24u nder the skin every evening   Yes [provider]  pantoprazole (PROTONIX) 40 MG tablet Take one tab po qd for stomach Patient taking differently: Take 40 mg by mouth daily.  03/22/19  Yes [name], MD  torsemide ([name]) 20 MG tablet Take 2 tablets (40 mg total) by mouth daily. 05/01/19  Yes Sudini, Alveta Heimlich, MD  warfarin (COUMADIN) 5 MG tablet TAKE ONE TABLET BY MOUTH ONCE DAILY AT 5-6PM AND TAKE ONE AND ONE-HALF TABLET BY MOUTH ON THURSDAY Patient taking differently: Take 5-7.5 mg by mouth See admin instructions. Take 1 tablet (5mg ) by mouth every Monday, Tuesday. Wednesday, Friday, Saturday and Sunday evening  and take 1 tablets (7.5mg ) by mouth every Thursday evening 02/22/19  Yes Bosc[place], Heather E, NP  potassium chloride SA (KLOR-CON) 20 MEQ tablet Take 20 mEq by mouth daily.    [provider]    No family history on file.   Social History   Tobacco Use  . Smoking status: Never Smoker  . Smokeless tobacco: Never Used  Substance Use Topics  . Alcohol use: No  . Drug use: No    Allergies as of 07/11/2019  . (No Known Allergies)    Review of Systems:    All systems reviewed and negative except where noted in HPI.   Physical Exam:  Vital signs  in last 24 hours: Vitals:   07/15/19 0325 07/15/19 0339 07/15/19 0826 07/15/19 1434  BP: (!) 104/41  (!) 145/48 (!) 114/47  Pulse: 72  70 72  Resp:   16   Temp:   98.7 F (37.1 C)   TempSrc:   Oral   SpO2: 95%  98%   Weight:  83.8 kg    Height:       Last BM Date: 07/12/19 General:   Pleasant, cooperative in NAD Head:  Normocephalic and atraumatic. Eyes:   No icterus.   Conjunctiva pink. PERRLA. Ears:  Normal auditory acuity. Neck:  Supple; no masses or thyroidomegaly Lungs: Respirations even and unlabored. Lungs clear to auscultation bilaterally.   No wheezes, crackles, or rhonchi.  Abdomen:  Soft, nondistended, nontender. Normal bowel sounds. No appreciable masses or hepatomegaly.  No rebound or guarding.  Neurologic:  Alert and oriented x3;  grossly normal neurologically. Skin:  Intact without significant lesions or rashes. Cervical Nodes:  No significant cervical adenopathy. Psych:  Alert and cooperative. Normal affect.  LAB RESULTS: Recent Labs    07/13/19 0441 07/14/19 0346 07/14/19 1932 07/15/19 0239  WBC 9.8 8.0  --  8.8  HGB 7.1* 6.4* 7.9* 7.8*  HCT 25.3* 22.8* 26.9* 26.3*  PLT 319 298  --  274   BMET Recent Labs    07/13/19 0441 07/14/19 0346 07/15/19 0239  NA 142 142 142  K 3.7 3.8 3.5  CL 100 99 99  CO2 33* 31 32  GLUCOSE 73 128* 103*  BUN 27* 28* 25*  CREATININE 1.23* 1.35* 1.35*  CALCIUM 9.1 8.9 8.6*   LFT No results for input(s): PROT, ALBUMIN, AST, ALT, ALKPHOS, BILITOT, BILIDIR, IBILI in the last 72 hours. PT/INR Recent Labs    07/14/19 0346 07/15/19 0239  LABPROT 37.8* 28.3*  INR 3.8* 2.7*    STUDIES: No results found.    Impression / Plan:   Kimberly Peterson is a 86 y.o. y/o female with acute on chronic iron deficiency anemia  Patient's INR is 2.7 today  If Coumadin can be held safely, EGD and colonoscopy for iron deficiency anemia can be done as an inpatient  Patient states she is willing to undergo procedures if  needed  Primary team to determine if Coumadin can be held and to hold and recheck INR with goal INR of less than 1.5 prior to endoscopic procedures  Continue serial CBCs and transfuse as needed  Dr. Marius Ditch will be following the patient after today, and can determine timing of colonoscopy based on INR levels  Thank you for involving me in the care of this patient.      LOS: 4 days   Virgel Manifold, MD  07/15/2019, 3:37 PM

## 2019-07-15 NOTE — Progress Notes (Signed)
Patient is currently followed by [facility] community Palliative program at home. [place] aware. Flo Shanks BSN, RN, [place] [phone]

## 2019-07-15 NOTE — Consult Note (Signed)
ANTICOAGULATION CONSULT NOTE  Pharmacy Consult for warfarin dosing Indication: atrial fibrillation  Patient Measurements: Height: 5\' 2"  (157.5 cm) Weight: 184 lb 11.9 oz (83.8 kg) IBW/kg (Calculated) : 50.1  Vital Signs: Temp: 98.7 F (37.1 C) (01/08 0826) Temp Source: Oral (01/08 0826) BP: 114/47 (01/08 1434) Pulse Rate: 72 (01/08 1434)  Labs: Recent Labs    07/13/19 0441 07/14/19 0346 07/14/19 1932 07/15/19 0239  HGB 7.1* 6.4* 7.9* 7.8*  HCT 25.3* 22.8* 26.9* 26.3*  PLT 319 298  --  274  LABPROT 40.9* 37.8*  --  28.3*  INR 4.3* 3.8*  --  2.7*  CREATININE 1.23* 1.35*  --  1.35*    Estimated Creatinine Clearance: 29.5 mL/min (A) (by C-G formula based on SCr of 1.35 mg/dL (H)).   Medical History: Past Medical History:  Diagnosis Date  . CHF (congestive heart failure) ([place])   . Chronic kidney disease   . COPD (chronic obstructive pulmonary disease) ([place])   . Diabetes mellitus without complication ([place])   . Hypertension     Medications:  Scheduled:  . atorvastatin  10 mg Oral Daily  . carvedilol  6.25 mg Oral BID WC  . Chlorhexidine Gluconate Cloth  6 each Topical Daily  . dextromethorphan-guaiFENesin  1 tablet Oral BID  . diltiazem  240 mg Oral Daily  . docusate sodium  100 mg Oral Daily  . ferrous sulfate  325 mg Oral BID WC  . [START ON 07/16/2019] furosemide  40 mg Oral BID  . hydrALAZINE  37.5 mg Oral TID  . insulin aspart  0-5 Units Subcutaneous QHS  . insulin aspart  0-9 Units Subcutaneous TID WC  . insulin aspart protamine- aspart  20 Units Subcutaneous Q breakfast   And  . insulin aspart protamine- aspart  16 Units Subcutaneous Q supper  . ipratropium  2 puff Inhalation Q4H  . mouth rinse  15 mL Mouth Rinse BID  . pantoprazole  40 mg Oral Daily  . potassium chloride  10 mEq Oral Daily  . senna-docusate  1 tablet Oral QHS  . sodium chloride flush  3 mL Intravenous Q12H  . Warfarin - Pharmacist Dosing Inpatient   Does not apply q1800     Assessment: 86 year old female with PMH afib and PE on warfarin PTA admitted with AECHF. Home dose of warfarin 5 mg daily except 7.5 mg on Thursday. Pharmacy consulted to continue warfarin in this patient. There are no new DDIs since the previous note. Warfarin has been held for the past 4 days secondary to elevated INRs.   Goal of Therapy:  INR 2-3 Monitor platelets by anticoagulation protocol: Yes   Plan:   Will order warfarin 2.5mg  x 1 and access INR with am labs.   Pharmacy will continue to monitor and adjust per consult.   Kimberly Peterson L, RPh 07/15/2019 2:55 PM

## 2019-07-15 NOTE — Progress Notes (Signed)
PROGRESS NOTE                                                                                                                                                                                                             Patient Demographics:    Kimberly Peterson, is a 86 y.o. female, DOB - [birth year], [id number]  Admit date - 07/11/2019   Admitting Physician Ivor Costa, MD  Outpatient Primary MD for the patient is [name], MD  LOS - 4  Outpatient Specialists: Cardiology  Chief Complaint  Patient presents with  . Shortness of Breath       Brief Narrative 86 year old obese female with diastolic CHF, chronic kidney disease stage III, A. fib on Coumadin, s/p AV replacement, pacemaker due to CHB, COPD on 2 L home O2, diabetes mellitus and hypertension presented with dyspnea on exertion with findings of acute on chronic diastolic CHF.  In the ED she was in respiratory distress and required BiPAP.    Subjective:  Breathing continues to  improve  Assessment  & Plan :    Principal Problem:   Acute on chronic diastolic heart failure (HCC) Acute on chronic respiratory failure with hypoxia (HCC) Diuresing well on IV Lasix , will hold further IV dose and transition to po lasix in am  Active Problems: Iron deficiency anemia Hemoglobin dropped to 6.4, baseline of 8. Hemoccult +. INR was supratherapeutic past 3 days. S/p 1 u PRBC. GI consulted for evaluation. Monitor serial H&h and transfuse as needed. No EGD / colonoscopy in several years.   Chronic A. fib Rate controlled on Coreg and Cardizem.  INR supratherapeutic on Coumadin.  Dosing per pharmacy.    Pulmonary embolism ,Chronic ([place]) On Coumadin.    Benign essential HTN BP soft, hold hydralazine  Chronic obstructive pulmonary disease with chronic hypoxic respiratory failure (HCC) Continue home inhaler.  On 2 L nasal cannula.     Type II diabetes mellitus with  renal manifestations (HCC) Continue insulin and sliding scale coverage.    Acute renal failure superimposed on stage 3a chronic kidney disease (HCC) Likely cardiorenal.  Improved with diuresis.  Hypokalemia Replenished  Goals of care discussion Appreciate palliative care consult.  Patient now made DNR after discussing with patient and her daughter with plan on outpatient follow-up with palliative care (is active with [facility]).  Code Status : DNR  Family Communication  : Daughter updated on the phone  Disposition Plan  : Home tomorrow. Need to monitor another day for any further drop in h&H and GI evaluation  Barriers For Discharge : Active symptoms,   Consults  : Palliative care  Procedures  : None  DVT Prophylaxis  : Coumadin  Lab Results  Component Value Date   PLT 274 07/15/2019    Antibiotics  :    Anti-infectives (From admission, onward)   None        Objective:   Vitals:   07/15/19 0325 07/15/19 0339 07/15/19 0826 07/15/19 1434  BP: (!) 104/41  (!) 145/48 (!) 114/47  Pulse: 72  70 72  Resp:   16   Temp:   98.7 F (37.1 C)   TempSrc:   Oral   SpO2: 95%  98%   Weight:  83.8 kg    Height:        Wt Readings from Last 3 Encounters:  07/15/19 83.8 kg  06/22/19 83.5 kg  06/07/19 82.9 kg     Intake/Output Summary (Last 24 hours) at 07/15/2019 1540 Last data filed at 07/15/2019 0918 Gross per 24 hour  Intake 372.34 ml  Output 850 ml  Net -477.66 ml   NAD  HEENT: pallor+. Moist mucosa  chest: clear b/l  CVS: N S1&S2 GI: soft, NT, ND  musculoskeletal: trace edema        Data Review:    CBC Recent Labs  Lab 07/11/19 1241 07/13/19 0441 07/14/19 0346 07/14/19 1932 07/15/19 0239  WBC 8.5 9.8 8.0  --  8.8  HGB 7.7* 7.1* 6.4* 7.9* 7.8*  HCT 27.2* 25.3* 22.8* 26.9* 26.3*  PLT 402* 319 298  --  274  MCV 75.8* 74.9* 74.3*  --  75.4*  MCH 21.4* 21.0* 20.8*  --  22.3*  MCHC 28.3* 28.1* 28.1*  --  29.7*  RDW 17.5* 17.4* 17.5*  --   17.6*  LYMPHSABS 2.1  --   --   --   --   MONOABS 0.9  --   --   --   --   EOSABS 0.1  --   --   --   --   BASOSABS 0.1  --   --   --   --     Chemistries  Recent Labs  Lab 07/11/19 1241 07/12/19 0502 07/13/19 0441 07/14/19 0346 07/15/19 0239  NA 139 144 142 142 142  K 3.4* 4.0 3.7 3.8 3.5  CL 98 102 100 99 99  CO2 26 30 33* 31 32  GLUCOSE 256* 126* 73 128* 103*  BUN 25* 26* 27* 28* 25*  CREATININE 1.40* 1.21* 1.23* 1.35* 1.35*  CALCIUM 9.0 9.1 9.1 8.9 8.6*  MG  --  2.3  --   --   --   AST 25  --   --   --   --   ALT 12  --   --   --   --   ALKPHOS 72  --   --   --   --   BILITOT 0.8  --   --   --   --    ------------------------------------------------------------------------------------------------------------------ No results for input(s): CHOL, HDL, LDLCALC, TRIG, CHOLHDL, LDLDIRECT in the last 72 hours.  Lab Results  Component Value Date   HGBA1C 5.9 (H) 07/12/2019   ------------------------------------------------------------------------------------------------------------------ No results for input(s): TSH, T4TOTAL, T3FREE, THYROIDAB in the last 72 hours.  Invalid input(s): FREET3 ------------------------------------------------------------------------------------------------------------------  Recent Labs    07/13/19 0441  FERRITIN 11  TIBC 430  IRON 15*    Coagulation profile Recent Labs  Lab 07/11/19 1241 07/12/19 0502 07/13/19 0441 07/14/19 0346 07/15/19 0239  INR 3.8* 3.8* 4.3* 3.8* 2.7*    No results for input(s): DDIMER in the last 72 hours.  Cardiac Enzymes No results for input(s): CKMB, TROPONINI, MYOGLOBIN in the last 168 hours.  Invalid input(s): CK ------------------------------------------------------------------------------------------------------------------    Component Value Date/Time   BNP 735.0 (H) 07/11/2019 1242    Inpatient Medications  Scheduled Meds: . atorvastatin  10 mg Oral Daily  . carvedilol  6.25 mg Oral  BID WC  . Chlorhexidine Gluconate Cloth  6 each Topical Daily  . dextromethorphan-guaiFENesin  1 tablet Oral BID  . diltiazem  240 mg Oral Daily  . docusate sodium  100 mg Oral Daily  . ferrous sulfate  325 mg Oral BID WC  . [START ON 07/16/2019] furosemide  40 mg Oral BID  . hydrALAZINE  37.5 mg Oral TID  . insulin aspart  0-5 Units Subcutaneous QHS  . insulin aspart  0-9 Units Subcutaneous TID WC  . insulin aspart protamine- aspart  20 Units Subcutaneous Q breakfast   And  . insulin aspart protamine- aspart  16 Units Subcutaneous Q supper  . ipratropium  2 puff Inhalation Q4H  . mouth rinse  15 mL Mouth Rinse BID  . pantoprazole  40 mg Oral Daily  . potassium chloride  10 mEq Oral Daily  . senna-docusate  1 tablet Oral QHS  . sodium chloride flush  3 mL Intravenous Q12H  . warfarin  2.5 mg Oral ONCE-1800  . Warfarin - Pharmacist Dosing Inpatient   Does not apply q1800   Continuous Infusions: . sodium chloride     PRN Meds:.sodium chloride, acetaminophen, albuterol, bisacodyl, hydrALAZINE, magnesium hydroxide, ondansetron (ZOFRAN) IV, sodium chloride flush  Micro Results Recent Results (from the past 240 hour(s))  Respiratory Panel by RT PCR (Flu A&B, Covid) - Nasopharyngeal Swab     Status: None   Collection Time: 07/11/19  1:07 PM   Specimen: Nasopharyngeal Swab  Result Value Ref Range Status   SARS Coronavirus 2 by RT PCR NEGATIVE NEGATIVE Final    Comment: (NOTE) SARS-CoV-2 target nucleic acids are NOT DETECTED. The SARS-CoV-2 RNA is generally detectable in upper respiratoy specimens during the acute phase of infection. The lowest concentration of SARS-CoV-2 viral copies this assay can detect is 131 copies/mL. A negative result does not preclude SARS-Cov-2 infection and should not be used as the sole basis for treatment or other patient management decisions. A negative result may occur with  improper specimen collection/handling, submission of specimen other than  nasopharyngeal swab, presence of viral mutation(s) within the areas targeted by this assay, and inadequate number of viral copies (<131 copies/mL). A negative result must be combined with clinical observations, patient history, and epidemiological information. The expected result is Negative. Fact Sheet for Patients:  PinkCheek.be Fact Sheet for Healthcare Providers:  GravelBags.it This test is not yet ap proved or cleared by the [place] FDA and  has been authorized for detection and/or diagnosis of SARS-CoV-2 by FDA under an Emergency Use Authorization (EUA). This EUA will remain  in effect (meaning this test can be used) for the duration of the COVID-19 declaration under Section 564(b)(1) of the Act, 21 U.S.C. section 360bbb-3(b)(1), unless the authorization is terminated or revoked sooner.    Influenza A by PCR NEGATIVE NEGATIVE Final   Influenza  B by PCR NEGATIVE NEGATIVE Final    Comment: (NOTE) The Xpert Xpress SARS-CoV-2/FLU/RSV assay is intended as an aid in  the diagnosis of influenza from Nasopharyngeal swab specimens and  should not be used as a sole basis for treatment. Nasal washings and  aspirates are unacceptable for Xpert Xpress SARS-CoV-2/FLU/RSV  testing. Fact Sheet for Patients: PinkCheek.be Fact Sheet for Healthcare Providers: GravelBags.it This test is not yet approved or cleared by the [place] FDA and  has been authorized for detection and/or diagnosis of SARS-CoV-2 by  FDA under an Emergency Use Authorization (EUA). This EUA will remain  in effect (meaning this test can be used) for the duration of the  Covid-19 declaration under Section 564(b)(1) of the Act, 21  U.S.C. section 360bbb-3(b)(1), unless the authorization is  terminated or revoked. Performed at [facility], [address] [place], [place] [address]   MRSA  PCR Screening     Status: None   Collection Time: 07/11/19  6:54 PM   Specimen: Nasal Mucosa; Nasopharyngeal  Result Value Ref Range Status   MRSA by PCR NEGATIVE NEGATIVE Final    Comment:        The GeneXpert MRSA Assay (FDA approved for NASAL specimens only), is one component of a comprehensive MRSA colonization surveillance program. It is not intended to diagnose MRSA infection nor to guide or monitor treatment for MRSA infections. Performed at [facility], [address], [place], [place] [address]     Radiology Reports DG Chest Portable 1 View  Result Date: 07/11/2019 CLINICAL DATA:  Worsening shortness of breath over the past 24 hours. EXAM: PORTABLE CHEST 1 VIEW COMPARISON:  Single-view of the chest 04/27/2019, 03/01/2019 and 12/03/2015. FINDINGS: Cardiomegaly and pulmonary vascular congestion are unchanged. Atherosclerosis is noted. Pacing device is in place. No acute or focal bony abnormality. IMPRESSION: 1. No change in cardiomegaly and pulmonary vascular congestion. 2. Atherosclerosis. Electronically Signed   By: Inge Rise M.D.   On: 07/11/2019 13:31   ECHOCARDIOGRAM COMPLETE  Result Date: 07/12/2019   ECHOCARDIOGRAM REPORT   Patient Name:   ALAZNE PENTLAND Date of Exam: 07/12/2019 Medical Rec #:  [id number]       Height:       62.0 in Accession #:    [id number]      Weight:       192.0 lb Date of Birth:  27-May-1930        BSA:          1.88 m Patient Age:    60 years        BP:           136/76 mmHg Patient Gender: F               HR:           72 bpm. Exam Location:  [facility] Procedure: 2D Echo, Color Doppler and Cardiac Doppler Indications:     I50.31 CHF-Acute Diastolic  History:         Patient has prior history of Echocardiogram examinations, most                  recent 04/29/2019. CHF, COPD and CKD; Risk Factors:Hypertension                  and Diabetes.  Sonographer:     Charmayne Sheer RDCS (AE) Referring Phys:  [id number] Soledad Gerlach NIU Diagnosing Phys: Yolonda Kida MD   Sonographer Comments: Suboptimal parasternal window and  no subcostal window. TDS due to pt sitting in recliner. Pt did not have IV access. IMPRESSIONS  1. Left ventricular ejection fraction, by visual estimation, is 60 to 65%. The left ventricle has normal function. Left ventricular septal wall thickness was normal. Normal left ventricular posterior wall thickness. There is no left ventricular hypertrophy.  2. The left ventricle has no regional wall motion abnormalities.  3. Global right ventricle has normal systolic function.The right ventricular size is normal. No increase in right ventricular wall thickness.  4. Left atrial size was normal.  5. Right atrial size was normal.  6. Moderate mitral annular calcification.  7. The mitral valve is myxomatous. Mild mitral valve regurgitation. Moderate mitral stenosis.  8. The tricuspid valve is grossly normal.  9. The aortic valve is grossly normal. Aortic valve regurgitation is trivial. Mild to moderate aortic valve sclerosis/calcification without any evidence of aortic stenosis. 10. The pulmonic valve was grossly normal. Pulmonic valve regurgitation is not visualized. 11. Severely elevated pulmonary artery systolic pressure. 12. A pacer wire is visualized. 13. Left ventricular ejection fraction by PLAX is is 62 % 14. The atrial septum is grossly normal. FINDINGS  Left Ventricle: Left ventricular ejection fraction, by visual estimation, is 60 to 65%. Left ventricular ejection fraction by PLAX is is 62 % The left ventricle has normal function. The left ventricle has no regional wall motion abnormalities. Normal left ventricular posterior wall thickness. There is no left ventricular hypertrophy. Right Ventricle: The right ventricular size is normal. No increase in right ventricular wall thickness. Global RV systolic function is has normal systolic function. The tricuspid regurgitant velocity is 3.89 m/s, and with an assumed right atrial pressure  of 10 mmHg, the estimated  right ventricular systolic pressure is severely elevated at 70.5 mmHg. Left Atrium: Left atrial size was normal in size. Right Atrium: Right atrial size was normal in size Pericardium: There is no evidence of pericardial effusion. Mitral Valve: The mitral valve is myxomatous. Moderate mitral annular calcification. Mild mitral valve regurgitation. Moderate mitral valve stenosis by observation. MV peak gradient, 14.0 mmHg. Trivial MS. Tricuspid Valve: The tricuspid valve is grossly normal. Tricuspid valve regurgitation mild-moderate. Aortic Valve: The aortic valve is grossly normal. Aortic valve regurgitation is trivial. Mild to moderate aortic valve sclerosis/calcification is present, without any evidence of aortic stenosis. Aortic valve mean gradient measures 10.0 mmHg. Aortic valve peak gradient measures 17.0 mmHg. Aortic valve area, by VTI measures 0.99 cm. Pulmonic Valve: The pulmonic valve was grossly normal. Pulmonic valve regurgitation is not visualized. Pulmonic regurgitation is not visualized. Aorta: The aortic root is normal in size and structure. IAS/Shunts: The atrial septum is grossly normal. Additional Comments: A pacer wire is visualized.  LEFT VENTRICLE PLAX 2D LV EF:         Left            Diastology                ventricular     LV e' lateral:   3.59 cm/s                ejection        LV E/e' lateral: 45.7                fraction by                PLAX is is                62 % LVIDd:  4.08 cm LVIDs:         2.72 cm LV PW:         0.84 cm LV IVS:        1.03 cm LVOT diam:     1.80 cm LV SV:         46 ml LV SV Index:   23.02 LVOT Area:     2.54 cm  LEFT ATRIUM             Index LA diam:        4.90 cm 2.61 cm/m LA Vol (A2C):   41.6 ml 22.14 ml/m LA Vol (A4C):   79.6 ml 42.37 ml/m LA Biplane Vol: 58.3 ml 31.03 ml/m  AORTIC VALVE                    PULMONIC VALVE AV Area (Vmax):    1.31 cm     PV Vmax:       0.83 m/s AV Area (Vmean):   1.28 cm     PV Vmean:      62.600 cm/s AV Area  (VTI):     0.99 cm     PV VTI:        0.196 m AV Vmax:           206.00 cm/s  PV Peak grad:  2.8 mmHg AV Vmean:          147.000 cm/s PV Mean grad:  2.0 mmHg AV VTI:            0.471 m AV Peak Grad:      17.0 mmHg AV Mean Grad:      10.0 mmHg LVOT Vmax:         106.00 cm/s LVOT Vmean:        73.700 cm/s LVOT VTI:          0.184 m LVOT/AV VTI ratio: 0.39  AORTA Ao Root diam: 3.00 cm MITRAL VALVE                         TRICUSPID VALVE MV Area (PHT): 2.50 cm              TR Peak grad:   60.5 mmHg MV Peak grad:  14.0 mmHg             TR Vmax:        433.00 cm/s MV Mean grad:  6.0 mmHg MV Vmax:       1.87 m/s              SHUNTS MV Vmean:      117.0 cm/s            Systemic VTI:  0.18 m MV VTI:        0.59 m                Systemic Diam: 1.80 cm MV PHT:        87.87 msec MV Decel Time: 303 msec MV E velocity: 164.00 cm/s 103 cm/s MV A velocity: 119.00 cm/s 70.3 cm/s MV E/A ratio:  1.38        1.5  Dwayne D Callwood MD Electronically signed by Yolonda Kida MD Signature Date/Time: 07/12/2019/5:05:28 PM    Final     Time Spent in minutes  25  Jerek Meulemans M.D on 07/15/2019 at 3:40 PM  Between 7am to 7pm - Pager - [phone]  After 7pm go to  [email or web address] - password [facility]  [facility] -  Office  [phone]

## 2019-07-15 NOTE — Care Management Important Message (Signed)
Important Message  Patient Details  Name: Kimberly Peterson MRN: [id number] Date of Birth: [birth year]   Medicare Important Message Given:  Yes     [name] 07/15/2019, 11:00 AM

## 2019-07-15 NOTE — Consult Note (Signed)
ANTICOAGULATION CONSULT NOTE  Pharmacy Consult for warfarin dosing Indication: atrial fibrillation  Patient Measurements: Height: 5\' 2"  (157.5 cm) Weight: 184 lb 11.9 oz (83.8 kg) IBW/kg (Calculated) : 50.1  Vital Signs: Temp: 98.7 F (37.1 C) (01/08 1701) Temp Source: Oral (01/08 1701) BP: 136/50 (01/08 1701) Pulse Rate: 74 (01/08 1701)  Labs: Recent Labs    07/13/19 0441 07/14/19 0346 07/14/19 1932 07/15/19 0239  HGB 7.1* 6.4* 7.9* 7.8*  HCT 25.3* 22.8* 26.9* 26.3*  PLT 319 298  --  274  LABPROT 40.9* 37.8*  --  28.3*  INR 4.3* 3.8*  --  2.7*  CREATININE 1.23* 1.35*  --  1.35*    Estimated Creatinine Clearance: 29.5 mL/min (A) (by C-G formula based on SCr of 1.35 mg/dL (H)).   Assessment: 86 year old female with PMH afib and PE on warfarin PTA admitted with AECHF. Home dose of warfarin 5 mg daily except 7.5 mg on Thursday. Pharmacy consulted to continue warfarin in this patient. There are no new DDIs since the previous note. Warfarin has been held for the past 4 days secondary to elevated INRs.   Goal of Therapy:  INR 2-3 Monitor platelets by anticoagulation protocol: Yes   Plan:  Warfarin order d/c'd by MD with note: Please hold coumadin to achieve INR <1.5 FOR egd/ Colonoscopy  Pharmacy will continue to monitor and adjust per consult.   Rocky Morel, [place] 07/15/2019 5:17 PM

## 2019-07-16 DIAGNOSIS — D509 Iron deficiency anemia, unspecified: Secondary | ICD-10-CM

## 2019-07-16 DIAGNOSIS — D500 Iron deficiency anemia secondary to blood loss (chronic): Secondary | ICD-10-CM

## 2019-07-16 LAB — BASIC METABOLIC PANEL
Anion gap: 7 (ref 5–15)
BUN: 18 mg/dL (ref 8–23)
CO2: 32 mmol/L (ref 22–32)
Calcium: 8.8 mg/dL — ABNORMAL LOW (ref 8.9–10.3)
Chloride: 100 mmol/L (ref 98–111)
Creatinine, Ser: 1.1 mg/dL — ABNORMAL HIGH (ref 0.44–1.00)
GFR calc Af Amer: 52 mL/min — ABNORMAL LOW (ref >60)
GFR calc non Af Amer: 45 mL/min — ABNORMAL LOW (ref >60)
Glucose, Bld: 169 mg/dL — ABNORMAL HIGH (ref 70–99)
Potassium: 3.5 mmol/L (ref 3.5–5.1)
Sodium: 139 mmol/L (ref 135–145)

## 2019-07-16 LAB — PROTIME-INR
INR: 1.9 — ABNORMAL HIGH (ref 0.8–1.2)
Prothrombin Time: 21.3 seconds — ABNORMAL HIGH (ref 11.4–15.2)

## 2019-07-16 LAB — GLUCOSE, CAPILLARY
Glucose-Capillary: 150 mg/dL — ABNORMAL HIGH (ref 70–99)
Glucose-Capillary: 179 mg/dL — ABNORMAL HIGH (ref 70–99)
Glucose-Capillary: 246 mg/dL — ABNORMAL HIGH (ref 70–99)
Glucose-Capillary: 138 mg/dL — ABNORMAL HIGH (ref 70–99)

## 2019-07-16 LAB — CBC
HCT: 27.6 % — ABNORMAL LOW (ref 36.0–46.0)
Hemoglobin: 8.1 g/dL — ABNORMAL LOW (ref 12.0–15.0)
MCH: 22.4 pg — ABNORMAL LOW (ref 26.0–34.0)
MCHC: 29.3 g/dL — ABNORMAL LOW (ref 30.0–36.0)
MCV: 76.2 fL — ABNORMAL LOW (ref 80.0–100.0)
Platelets: 279 10*3/uL (ref 150–400)
RBC: 3.62 MIL/uL — ABNORMAL LOW (ref 3.87–5.11)
RDW: 17.8 % — ABNORMAL HIGH (ref 11.5–15.5)
WBC: 9 10*3/uL (ref 4.0–10.5)
nRBC: 0 % (ref 0.0–0.2)

## 2019-07-16 LAB — VITAMIN B12: Vitamin B-12: 473 pg/mL (ref 180–914)

## 2019-07-16 LAB — FOLATE: Folate: 11.7 ng/mL (ref >5.9)

## 2019-07-16 MED ORDER — PEG 3350-KCL-NA BICARB-NACL 420 G PO SOLR
4000.0000 mL | Freq: Once | ORAL | Status: AC
  Administered 2019-07-16: 4000 mL via ORAL
  Filled 2019-07-16: qty 4000

## 2019-07-16 MED ORDER — SODIUM CHLORIDE 0.9 % IV SOLN
300.0000 mg | Freq: Once | INTRAVENOUS | Status: AC
  Administered 2019-07-16: 15:00:00 300 mg via INTRAVENOUS
  Filled 2019-07-16: qty 15

## 2019-07-16 MED ORDER — MAGNESIUM CITRATE PO SOLN
1.0000 | Freq: Once | ORAL | Status: AC
  Administered 2019-07-16: 1 via ORAL
  Filled 2019-07-16: qty 296

## 2019-07-16 MED ORDER — TORSEMIDE 20 MG PO TABS: 40.0000 mg | ORAL_TABLET | Freq: Every day | ORAL | Status: DC

## 2019-07-16 NOTE — Progress Notes (Signed)
Kimberly Darby, MD [address]  [place]  [place], [place] [address]  Main: [phone]  Fax: [phone] Pager: [phone]   Subjective: Patient is sitting up in chair, she reports tolerating clear liquids.  She reports having a BM today, does not know the color.  She denies abdominal pain, nausea or vomiting.  She denies shortness of breath, chest pain   Objective: Vital signs in last 24 hours: Vitals:   07/16/19 0010 07/16/19 0500 07/16/19 0758 07/16/19 1526  BP: (!) 116/47  (!) 146/53 (!) 142/56  Pulse: 70  70 69  Resp: 16  18 18   Temp: 98.5 F (36.9 C)  98.6 F (37 C) 98.6 F (37 C)  TempSrc: Axillary  Oral Oral  SpO2: 100%  98% 98%  Weight:  84.2 kg    Height:       Weight change: 0.4 kg  Intake/Output Summary (Last 24 hours) at 07/16/2019 1528 Last data filed at 07/16/2019 1439 Gross per 24 hour  Intake 0 ml  Output 200 ml  Net -200 ml     Exam: Heart:: Regular rate and rhythm, S1S2 present or without murmur or extra heart sounds Lungs: normal and clear to auscultation Abdomen: soft, nontender, normal bowel sounds   Lab Results: CBC Latest Ref Rng & Units 07/16/2019 07/15/2019 07/14/2019  WBC 4.0 - 10.5 K/uL 9.0 8.8 -  Hemoglobin 12.0 - 15.0 g/dL 8.1(L) 7.8(L) 7.9(L)  Hematocrit 36.0 - 46.0 % 27.6(L) 26.3(L) 26.9(L)  Platelets 150 - 400 K/uL 279 274 -   CMP Latest Ref Rng & Units 07/16/2019 07/15/2019 07/14/2019  Glucose 70 - 99 mg/dL 169(H) 103(H) 128(H)  BUN 8 - 23 mg/dL 18 25(H) 28(H)  Creatinine 0.44 - 1.00 mg/dL 1.10(H) 1.35(H) 1.35(H)  Sodium 135 - 145 mmol/L 139 142 142  Potassium 3.5 - 5.1 mmol/L 3.5 3.5 3.8  Chloride 98 - 111 mmol/L 100 99 99  CO2 22 - 32 mmol/L 32 32 31  Calcium 8.9 - 10.3 mg/dL 8.8(L) 8.6(L) 8.9  Total Protein 6.5 - 8.1 g/dL - - -  Total Bilirubin 0.3 - 1.2 mg/dL - - -  Alkaline Phos 38 - 126 U/L - - -  AST 15 - 41 U/L - - -  ALT 0 - 44 U/L - - -    Micro Results: Recent Results (from the past 240 hour(s))    Respiratory Panel by RT PCR (Flu A&B, Covid) - Nasopharyngeal Swab     Status: None   Collection Time: 07/11/19  1:07 PM   Specimen: Nasopharyngeal Swab  Result Value Ref Range Status   SARS Coronavirus 2 by RT PCR NEGATIVE NEGATIVE Final    Comment: (NOTE) SARS-CoV-2 target nucleic acids are NOT DETECTED. The SARS-CoV-2 RNA is generally detectable in upper respiratoy specimens during the acute phase of infection. The lowest concentration of SARS-CoV-2 viral copies this assay can detect is 131 copies/mL. A negative result does not preclude SARS-Cov-2 infection and should not be used as the sole basis for treatment or other patient management decisions. A negative result may occur with  improper specimen collection/handling, submission of specimen other than nasopharyngeal swab, presence of viral mutation(s) within the areas targeted by this assay, and inadequate number of viral copies (<131 copies/mL). A negative result must be combined with clinical observations, patient history, and epidemiological information. The expected result is Negative. Fact Sheet for Patients:  PinkCheek.be Fact Sheet for Healthcare Providers:  GravelBags.it This test is not yet ap proved or  cleared by the [place] and  has been authorized for detection and/or diagnosis of SARS-CoV-2 by FDA under an Emergency Use Authorization (EUA). This EUA will remain  in effect (meaning this test can be used) for the duration of the COVID-19 declaration under Section 564(b)(1) of the Act, 21 U.S.C. section 360bbb-3(b)(1), unless the authorization is terminated or revoked sooner.    Influenza A by PCR NEGATIVE NEGATIVE Final   Influenza B by PCR NEGATIVE NEGATIVE Final    Comment: (NOTE) The Xpert Xpress SARS-CoV-2/FLU/RSV assay is intended as an aid in  the diagnosis of influenza from Nasopharyngeal swab specimens and  should not be used as a sole  basis for treatment. Nasal washings and  aspirates are unacceptable for Xpert Xpress SARS-CoV-2/FLU/RSV  testing. Fact Sheet for Patients: PinkCheek.be Fact Sheet for Healthcare Providers: GravelBags.it This test is not yet approved or cleared by the [place] FDA and  has been authorized for detection and/or diagnosis of SARS-CoV-2 by  FDA under an Emergency Use Authorization (EUA). This EUA will remain  in effect (meaning this test can be used) for the duration of the  Covid-19 declaration under Section 564(b)(1) of the Act, 21  U.S.C. section 360bbb-3(b)(1), unless the authorization is  terminated or revoked. Performed at [facility], [address] [place], [place] [address]   MRSA PCR Screening     Status: None   Collection Time: 07/11/19  6:54 PM   Specimen: Nasal Mucosa; Nasopharyngeal  Result Value Ref Range Status   MRSA by PCR NEGATIVE NEGATIVE Final    Comment:        The GeneXpert MRSA Assay (FDA approved for NASAL specimens only), is one component of a comprehensive MRSA colonization surveillance program. It is not intended to diagnose MRSA infection nor to guide or monitor treatment for MRSA infections. Performed at [facility], [address] [place], [place] [address]    Studies/Results: No results found. Medications:  I have reviewed the patient's current medications. Prior to Admission:  Medications Prior to Admission  Medication Sig Dispense Refill Last Dose  . aspirin 81 MG tablet Take 81 mg by mouth daily.   07/10/2019 at 0800  . atorvastatin (LIPITOR) 10 MG tablet TAKE 1 TABLET BY MOUTH ONCE DAILY FOR CHOLESTEROL (Patient taking differently: Take 10 mg by mouth daily. ) 90 tablet 3 07/10/2019 at 0800  . CARTIA XT 240 MG 24 hr capsule Take 1 capsule by mouth once daily (Patient taking differently: Take 240 mg by mouth daily. ) 90 capsule 1 07/10/2019 at 0800  . carvedilol  (COREG) 6.25 MG tablet Take 1 tablet (6.25 mg total) by mouth 2 (two) times daily with a meal. 180 tablet 3 07/10/2019 at 1800  . hydrALAZINE (APRESOLINE) 25 MG tablet TAKE 1 1/2 TABLETS BY MOUTH THREE TIMES DAILY FOR BLOOD PRESSURE (Patient taking differently: Take 37.5 mg by mouth 3 (three) times daily. TAKE 1 1/2 TABLETS BY MOUTH THREE TIMES DAILY FOR BLOOD PRESSURE) 405 tablet 0 07/10/2019 at 1800  . insulin aspart protamine- aspart (NOVOLOG MIX 70/30) (70-30) 100 UNIT/ML injection Inject 21-34 Units into the skin See admin instructions. Inject 32u under the skin every morning and inject 24u nder the skin every evening   07/10/2019 at 1800  . pantoprazole (PROTONIX) 40 MG tablet Take one tab po qd for stomach (Patient taking differently: Take 40 mg by mouth daily. ) 90 tablet 1 07/10/2019 at 1800  . torsemide ([name]) 20 MG tablet Take 2 tablets (  40 mg total) by mouth daily. 60 tablet 0 07/10/2019 at 0800  . warfarin (COUMADIN) 5 MG tablet TAKE ONE TABLET BY MOUTH ONCE DAILY AT 5-6PM AND TAKE ONE AND ONE-HALF TABLET BY MOUTH ON THURSDAY (Patient taking differently: Take 5-7.5 mg by mouth See admin instructions. Take 1 tablet (5mg ) by mouth every Monday, Tuesday. Wednesday, Friday, Saturday and Sunday evening and take 1 tablets (7.5mg ) by mouth every Thursday evening) 90 tablet 4 07/10/2019 at 1800  . potassium chloride SA (KLOR-CON) 20 MEQ tablet Take 20 mEq by mouth daily.   Not Taking at Unknown time   Scheduled: . atorvastatin  10 mg Oral Daily  . carvedilol  6.25 mg Oral BID WC  . Chlorhexidine Gluconate Cloth  6 each Topical Daily  . dextromethorphan-guaiFENesin  1 tablet Oral BID  . diltiazem  240 mg Oral Daily  . docusate sodium  100 mg Oral Daily  . furosemide  40 mg Oral BID  . hydrALAZINE  37.5 mg Oral TID  . insulin aspart  0-5 Units Subcutaneous QHS  . insulin aspart  0-9 Units Subcutaneous TID WC  . ipratropium  2 puff Inhalation Q4H  . magnesium citrate  1 Bottle Oral Once  . mouth rinse   15 mL Mouth Rinse BID  . pantoprazole  40 mg Oral Daily  . polyethylene glycol-electrolytes  4,000 mL Oral Once  . potassium chloride  10 mEq Oral Daily  . senna-docusate  1 tablet Oral QHS  . sodium chloride flush  3 mL Intravenous Q12H  . Warfarin - Pharmacist Dosing Inpatient   Does not apply q1800   Continuous: . sodium chloride    . iron sucrose 300 mg (07/16/19 1439)   [id number] chloride, acetaminophen, albuterol, bisacodyl, hydrALAZINE, magnesium hydroxide, ondansetron (ZOFRAN) IV, sodium chloride flush Anti-infectives (From admission, onward)   None     Scheduled Meds: . atorvastatin  10 mg Oral Daily  . carvedilol  6.25 mg Oral BID WC  . Chlorhexidine Gluconate Cloth  6 each Topical Daily  . dextromethorphan-guaiFENesin  1 tablet Oral BID  . diltiazem  240 mg Oral Daily  . docusate sodium  100 mg Oral Daily  . furosemide  40 mg Oral BID  . hydrALAZINE  37.5 mg Oral TID  . insulin aspart  0-5 Units Subcutaneous QHS  . insulin aspart  0-9 Units Subcutaneous TID WC  . ipratropium  2 puff Inhalation Q4H  . magnesium citrate  1 Bottle Oral Once  . mouth rinse  15 mL Mouth Rinse BID  . pantoprazole  40 mg Oral Daily  . polyethylene glycol-electrolytes  4,000 mL Oral Once  . potassium chloride  10 mEq Oral Daily  . senna-docusate  1 tablet Oral QHS  . sodium chloride flush  3 mL Intravenous Q12H  . Warfarin - Pharmacist Dosing Inpatient   Does not apply q1800   Continuous Infusions: . sodium chloride    . iron sucrose 300 mg (07/16/19 1439)   PRN Meds:.sodium chloride, acetaminophen, albuterol, bisacodyl, hydrALAZINE, magnesium hydroxide, ondansetron (ZOFRAN) IV, sodium chloride flush   Assessment: Principal Problem:   Acute on chronic diastolic heart failure (HCC) Active Problems:   Pulmonary embolism (HCC)   Benign essential HTN   Chronic obstructive pulmonary disease (HCC)   Hyperlipidemia, mixed   Atrial fibrillation (HCC)   Acute and chronic respiratory  failure with hypoxia (HCC)   Type II diabetes mellitus with renal manifestations (HCC)   Microcytic anemia   Lactic acidosis   Hypokalemia  Elevated troponin   Acute renal failure superimposed on stage 3a chronic kidney disease (HCC)  Hemoglobin is stable, no evidence of active GI bleed  Plan: Recommend EGD and colonoscopy tomorrow Continue clear liquid diet Bowel prep today N.p.o. past midnight Administered IV iron, ordered Check B12 and folate panel and replete if needed   LOS: 5 days   Debara Kamphuis 07/16/2019, 3:28 PM

## 2019-07-16 NOTE — Progress Notes (Signed)
Bipap refused 

## 2019-07-16 NOTE — Consult Note (Signed)
ANTICOAGULATION CONSULT NOTE  Pharmacy Consult for warfarin dosing Indication: atrial fibrillation  Patient Measurements: Height: 5\' 2"  (157.5 cm) Weight: 185 lb 10 oz (84.2 kg) IBW/kg (Calculated) : 50.1  Vital Signs: Temp: 98.6 F (37 C) (01/09 0758) Temp Source: Oral (01/09 0758) BP: 146/53 (01/09 0758) Pulse Rate: 70 (01/09 0758)  Labs: Recent Labs    07/14/19 0346 07/14/19 1932 07/15/19 0239 07/16/19 0444  HGB 6.4* 7.9* 7.8* 8.1*  HCT 22.8* 26.9* 26.3* 27.6*  PLT 298  --  274 279  LABPROT 37.8*  --  28.3* 21.3*  INR 3.8*  --  2.7* 1.9*  CREATININE 1.35*  --  1.35* 1.10*    Estimated Creatinine Clearance: 36.2 mL/min (A) (by C-G formula based on SCr of 1.1 mg/dL (H)).   Assessment: 86 year old female with PMH afib and PE on warfarin PTA admitted with AECHF. Home dose of warfarin 5 mg daily except 7.5 mg on Thursday. Pharmacy consulted to continue warfarin in this patient. There are no new DDIs since the previous note. Warfarin has been held for the past 4 days secondary to elevated INRs.   Goal of Therapy:  INR 2-3 Monitor platelets by anticoagulation protocol: Yes   Plan:  Warfarin order d/c'd by MD with note: Please hold coumadin to achieve INR <1.5 FOR egd/ Colonoscopy  Will continue to hold warfarin and check INR daily. Please reconsult pharmacy when appropriate to restart warfarin after procedure.  Pharmacy will continue to follow.  Tawnya Crook, PharmD 07/16/2019 8:12 AM

## 2019-07-16 NOTE — Progress Notes (Signed)
PROGRESS NOTE                                                                                                                                                                                                             Patient Demographics:    Kimberly Peterson, is a 86 y.o. female, DOB - [birth year], [id number]  Admit date - 07/11/2019   Admitting Physician Ivor Costa, MD  Outpatient Primary MD for the patient is [name], MD  LOS - 5  Outpatient Specialists: Cardiology  Chief Complaint  Patient presents with  . Shortness of Breath       Brief Narrative 86 year old obese female with diastolic CHF, chronic kidney disease stage III, A. fib on Coumadin, s/p AV replacement, pacemaker due to CHB, COPD on 2 L home O2, diabetes mellitus and hypertension presented with dyspnea on exertion with findings of acute on chronic diastolic CHF.  In the ED she was in respiratory distress and required BiPAP.    Subjective:  No breathing issues.  Denies any weakness or dizziness.  Assessment  & Plan :    Principal Problem:   Acute on chronic diastolic heart failure (HCC) Acute on chronic respiratory failure with hypoxia (HCC) Diuresed quite well with IV Lasix.  Now euvolemic.  Transition to oral Lasix twice daily.  Active Problems: Iron deficiency anemia Hemoglobin dropped to 6.4, baseline of 8. Hemoccult +. INR was supratherapeutic past 3 days.  Given 1 unit PRBC with improvement.  Added PPI.  GI consulted.  Recommend to hold Coumadin for INR to drop <1.5 and plan on EGD/colonoscopy as inpatient.   Chronic A. fib Rate controlled on Coreg and Cardizem.  INR supratherapeutic on Coumadin.  Held.    Pulmonary embolism ,Chronic (HCC) Therapeutic INR.    Benign essential HTN Blood pressure soft.  Hydralazine held.  Chronic obstructive pulmonary disease with chronic hypoxic respiratory failure (HCC) Continue home inhaler.  On 2 L  nasal cannula.     Type II diabetes mellitus with renal manifestations (HCC) Poor p.o. intake.  CBG low normal.  Reduce insulin dose and monitor on sliding scale coverage.    Acute renal failure superimposed on stage 3a chronic kidney disease (HCC) Likely cardiorenal.  Improved with diuresis.  Hypokalemia Replenished  Goals of care discussion Appreciate palliative care consult.  Patient now made DNR after  discussing with patient and her daughter with plan on outpatient follow-up with palliative care (is active with [facility]).    Code Status : DNR  Family Communication  : Daughter updated on the phone  Disposition Plan  : Home pending inpatient GI work-up, possible EGD/colonoscopy tomorrow.  Barriers For Discharge : Active symptoms,   Consults  : Palliative care, GI  Procedures  : None  DVT Prophylaxis  : Therapeutic INR.  GI bleed.  Lab Results  Component Value Date   PLT 279 07/16/2019    Antibiotics  :    Anti-infectives (From admission, onward)   None        Objective:   Vitals:   07/15/19 2251 07/16/19 0010 07/16/19 0500 07/16/19 0758  BP:  (!) 116/47  (!) 146/53  Pulse:  70  70  Resp:  16  18  Temp:  98.5 F (36.9 C)  98.6 F (37 C)  TempSrc:  Axillary  Oral  SpO2: 99% 100%  98%  Weight:   84.2 kg   Height:        Wt Readings from Last 3 Encounters:  07/16/19 84.2 kg  06/22/19 83.5 kg  06/07/19 82.9 kg     Intake/Output Summary (Last 24 hours) at 07/16/2019 1330 Last data filed at 07/16/2019 0326 Gross per 24 hour  Intake --  Output 200 ml  Net -200 ml   Elderly female not in distress HEENT: Pallor present, moist mucosa Chest: Clear CVs: Normal S1-S2 GI: Soft, nontender, nondistended Musculoskeletal: Warm, edema resolved       Data Review:    CBC Recent Labs  Lab 07/11/19 1241 07/13/19 0441 07/14/19 0346 07/14/19 1932 07/15/19 0239 07/16/19 0444  WBC 8.5 9.8 8.0  --  8.8 9.0  HGB 7.7* 7.1* 6.4* 7.9* 7.8* 8.1*  HCT  27.2* 25.3* 22.8* 26.9* 26.3* 27.6*  PLT 402* 319 298  --  274 279  MCV 75.8* 74.9* 74.3*  --  75.4* 76.2*  MCH 21.4* 21.0* 20.8*  --  22.3* 22.4*  MCHC 28.3* 28.1* 28.1*  --  29.7* 29.3*  RDW 17.5* 17.4* 17.5*  --  17.6* 17.8*  LYMPHSABS 2.1  --   --   --   --   --   MONOABS 0.9  --   --   --   --   --   EOSABS 0.1  --   --   --   --   --   BASOSABS 0.1  --   --   --   --   --     Chemistries  Recent Labs  Lab 07/11/19 1241 07/12/19 0502 07/13/19 0441 07/14/19 0346 07/15/19 0239 07/16/19 0444  NA 139 144 142 142 142 139  K 3.4* 4.0 3.7 3.8 3.5 3.5  CL 98 102 100 99 99 100  CO2 26 30 33* 31 32 32  GLUCOSE 256* 126* 73 128* 103* 169*  BUN 25* 26* 27* 28* 25* 18  CREATININE 1.40* 1.21* 1.23* 1.35* 1.35* 1.10*  CALCIUM 9.0 9.1 9.1 8.9 8.6* 8.8*  MG  --  2.3  --   --   --   --   AST 25  --   --   --   --   --   ALT 12  --   --   --   --   --   ALKPHOS 72  --   --   --   --   --   BILITOT  0.8  --   --   --   --   --    ------------------------------------------------------------------------------------------------------------------ No results for input(s): CHOL, HDL, LDLCALC, TRIG, CHOLHDL, LDLDIRECT in the last 72 hours.  Lab Results  Component Value Date   HGBA1C 5.9 (H) 07/12/2019   ------------------------------------------------------------------------------------------------------------------ No results for input(s): TSH, T4TOTAL, T3FREE, THYROIDAB in the last 72 hours.  Invalid input(s): FREET3 ------------------------------------------------------------------------------------------------------------------ No results for input(s): VITAMINB12, FOLATE, FERRITIN, TIBC, IRON, RETICCTPCT in the last 72 hours.  Coagulation profile Recent Labs  Lab 07/12/19 0502 07/13/19 0441 07/14/19 0346 07/15/19 0239 07/16/19 0444  INR 3.8* 4.3* 3.8* 2.7* 1.9*    No results for input(s): DDIMER in the last 72 hours.  Cardiac Enzymes No results for input(s): CKMB,  TROPONINI, MYOGLOBIN in the last 168 hours.  Invalid input(s): CK ------------------------------------------------------------------------------------------------------------------    Component Value Date/Time   BNP 735.0 (H) 07/11/2019 1242    Inpatient Medications  Scheduled Meds: . atorvastatin  10 mg Oral Daily  . carvedilol  6.25 mg Oral BID WC  . Chlorhexidine Gluconate Cloth  6 each Topical Daily  . dextromethorphan-guaiFENesin  1 tablet Oral BID  . diltiazem  240 mg Oral Daily  . docusate sodium  100 mg Oral Daily  . furosemide  40 mg Oral BID  . hydrALAZINE  37.5 mg Oral TID  . insulin aspart  0-5 Units Subcutaneous QHS  . insulin aspart  0-9 Units Subcutaneous TID WC  . ipratropium  2 puff Inhalation Q4H  . mouth rinse  15 mL Mouth Rinse BID  . pantoprazole  40 mg Oral Daily  . potassium chloride  10 mEq Oral Daily  . senna-docusate  1 tablet Oral QHS  . sodium chloride flush  3 mL Intravenous Q12H  . Warfarin - Pharmacist Dosing Inpatient   Does not apply q1800   Continuous Infusions: . sodium chloride    . iron sucrose     PRN Meds:.sodium chloride, acetaminophen, albuterol, bisacodyl, hydrALAZINE, magnesium hydroxide, ondansetron (ZOFRAN) IV, sodium chloride flush  Micro Results Recent Results (from the past 240 hour(s))  Respiratory Panel by RT PCR (Flu A&B, Covid) - Nasopharyngeal Swab     Status: None   Collection Time: 07/11/19  1:07 PM   Specimen: Nasopharyngeal Swab  Result Value Ref Range Status   SARS Coronavirus 2 by RT PCR NEGATIVE NEGATIVE Final    Comment: (NOTE) SARS-CoV-2 target nucleic acids are NOT DETECTED. The SARS-CoV-2 RNA is generally detectable in upper respiratoy specimens during the acute phase of infection. The lowest concentration of SARS-CoV-2 viral copies this assay can detect is 131 copies/mL. A negative result does not preclude SARS-Cov-2 infection and should not be used as the sole basis for treatment or other patient  management decisions. A negative result may occur with  improper specimen collection/handling, submission of specimen other than nasopharyngeal swab, presence of viral mutation(s) within the areas targeted by this assay, and inadequate number of viral copies (<131 copies/mL). A negative result must be combined with clinical observations, patient history, and epidemiological information. The expected result is Negative. Fact Sheet for Patients:  PinkCheek.be Fact Sheet for Healthcare Providers:  GravelBags.it This test is not yet ap proved or cleared by the [place] FDA and  has been authorized for detection and/or diagnosis of SARS-CoV-2 by FDA under an Emergency Use Authorization (EUA). This EUA will remain  in effect (meaning this test can be used) for the duration of the COVID-19 declaration under Section 564(b)(1) of the Act, 21 U.S.C.  section 360bbb-3(b)(1), unless the authorization is terminated or revoked sooner.    Influenza A by PCR NEGATIVE NEGATIVE Final   Influenza B by PCR NEGATIVE NEGATIVE Final    Comment: (NOTE) The Xpert Xpress SARS-CoV-2/FLU/RSV assay is intended as an aid in  the diagnosis of influenza from Nasopharyngeal swab specimens and  should not be used as a sole basis for treatment. Nasal washings and  aspirates are unacceptable for Xpert Xpress SARS-CoV-2/FLU/RSV  testing. Fact Sheet for Patients: PinkCheek.be Fact Sheet for Healthcare Providers: GravelBags.it This test is not yet approved or cleared by the [place] FDA and  has been authorized for detection and/or diagnosis of SARS-CoV-2 by  FDA under an Emergency Use Authorization (EUA). This EUA will remain  in effect (meaning this test can be used) for the duration of the  Covid-19 declaration under Section 564(b)(1) of the Act, 21  U.S.C. section 360bbb-3(b)(1), unless the  authorization is  terminated or revoked. Performed at [facility], [address] [place], [place] [address]   MRSA PCR Screening     Status: None   Collection Time: 07/11/19  6:54 PM   Specimen: Nasal Mucosa; Nasopharyngeal  Result Value Ref Range Status   MRSA by PCR NEGATIVE NEGATIVE Final    Comment:        The GeneXpert MRSA Assay (FDA approved for NASAL specimens only), is one component of a comprehensive MRSA colonization surveillance program. It is not intended to diagnose MRSA infection nor to guide or monitor treatment for MRSA infections. Performed at [facility], [address] [place], [place] [address]     Radiology Reports DG Chest Portable 1 View  Result Date: 07/11/2019 CLINICAL DATA:  Worsening shortness of breath over the past 24 hours. EXAM: PORTABLE CHEST 1 VIEW COMPARISON:  Single-view of the chest 04/27/2019, 03/01/2019 and 12/03/2015. FINDINGS: Cardiomegaly and pulmonary vascular congestion are unchanged. Atherosclerosis is noted. Pacing device is in place. No acute or focal bony abnormality. IMPRESSION: 1. No change in cardiomegaly and pulmonary vascular congestion. 2. Atherosclerosis. Electronically Signed   By: Inge Rise M.D.   On: 07/11/2019 13:31   ECHOCARDIOGRAM COMPLETE  Result Date: 07/12/2019   ECHOCARDIOGRAM REPORT   Patient Name:   TEDDI BOWLBY Date of Exam: 07/12/2019 Medical Rec #:  [id number]       Height:       62.0 in Accession #:    [id number]      Weight:       192.0 lb Date of Birth:  1929/07/28        BSA:          1.88 m Patient Age:    5 years        BP:           136/76 mmHg Patient Gender: F               HR:           72 bpm. Exam Location:  [facility] Procedure: 2D Echo, Color Doppler and Cardiac Doppler Indications:     I50.31 CHF-Acute Diastolic  History:         Patient has prior history of Echocardiogram examinations, most                  recent 04/29/2019. CHF, COPD and CKD; Risk Factors:Hypertension                   and Diabetes.  Sonographer:  Charmayne Sheer RDCS (AE) Referring Phys:  Baker Janus Soledad Gerlach NIU Diagnosing Phys: Yolonda Kida MD  Sonographer Comments: Suboptimal parasternal window and no subcostal window. TDS due to pt sitting in recliner. Pt did not have IV access. IMPRESSIONS  1. Left ventricular ejection fraction, by visual estimation, is 60 to 65%. The left ventricle has normal function. Left ventricular septal wall thickness was normal. Normal left ventricular posterior wall thickness. There is no left ventricular hypertrophy.  2. The left ventricle has no regional wall motion abnormalities.  3. Global right ventricle has normal systolic function.The right ventricular size is normal. No increase in right ventricular wall thickness.  4. Left atrial size was normal.  5. Right atrial size was normal.  6. Moderate mitral annular calcification.  7. The mitral valve is myxomatous. Mild mitral valve regurgitation. Moderate mitral stenosis.  8. The tricuspid valve is grossly normal.  9. The aortic valve is grossly normal. Aortic valve regurgitation is trivial. Mild to moderate aortic valve sclerosis/calcification without any evidence of aortic stenosis. 10. The pulmonic valve was grossly normal. Pulmonic valve regurgitation is not visualized. 11. Severely elevated pulmonary artery systolic pressure. 12. A pacer wire is visualized. 13. Left ventricular ejection fraction by PLAX is is 62 % 14. The atrial septum is grossly normal. FINDINGS  Left Ventricle: Left ventricular ejection fraction, by visual estimation, is 60 to 65%. Left ventricular ejection fraction by PLAX is is 62 % The left ventricle has normal function. The left ventricle has no regional wall motion abnormalities. Normal left ventricular posterior wall thickness. There is no left ventricular hypertrophy. Right Ventricle: The right ventricular size is normal. No increase in right ventricular wall thickness. Global RV systolic function is has normal  systolic function. The tricuspid regurgitant velocity is 3.89 m/s, and with an assumed right atrial pressure  of 10 mmHg, the estimated right ventricular systolic pressure is severely elevated at 70.5 mmHg. Left Atrium: Left atrial size was normal in size. Right Atrium: Right atrial size was normal in size Pericardium: There is no evidence of pericardial effusion. Mitral Valve: The mitral valve is myxomatous. Moderate mitral annular calcification. Mild mitral valve regurgitation. Moderate mitral valve stenosis by observation. MV peak gradient, 14.0 mmHg. Trivial MS. Tricuspid Valve: The tricuspid valve is grossly normal. Tricuspid valve regurgitation mild-moderate. Aortic Valve: The aortic valve is grossly normal. Aortic valve regurgitation is trivial. Mild to moderate aortic valve sclerosis/calcification is present, without any evidence of aortic stenosis. Aortic valve mean gradient measures 10.0 mmHg. Aortic valve peak gradient measures 17.0 mmHg. Aortic valve area, by VTI measures 0.99 cm. Pulmonic Valve: The pulmonic valve was grossly normal. Pulmonic valve regurgitation is not visualized. Pulmonic regurgitation is not visualized. Aorta: The aortic root is normal in size and structure. IAS/Shunts: The atrial septum is grossly normal. Additional Comments: A pacer wire is visualized.  LEFT VENTRICLE PLAX 2D LV EF:         Left            Diastology                ventricular     LV e' lateral:   3.59 cm/s                ejection        LV E/e' lateral: 45.7                fraction by  PLAX is is                62 % LVIDd:         4.08 cm LVIDs:         2.72 cm LV PW:         0.84 cm LV IVS:        1.03 cm LVOT diam:     1.80 cm LV SV:         46 ml LV SV Index:   23.02 LVOT Area:     2.54 cm  LEFT ATRIUM             Index LA diam:        4.90 cm 2.61 cm/m LA Vol (A2C):   41.6 ml 22.14 ml/m LA Vol (A4C):   79.6 ml 42.37 ml/m LA Biplane Vol: 58.3 ml 31.03 ml/m  AORTIC VALVE                     PULMONIC VALVE AV Area (Vmax):    1.31 cm     PV Vmax:       0.83 m/s AV Area (Vmean):   1.28 cm     PV Vmean:      62.600 cm/s AV Area (VTI):     0.99 cm     PV VTI:        0.196 m AV Vmax:           206.00 cm/s  PV Peak grad:  2.8 mmHg AV Vmean:          147.000 cm/s PV Mean grad:  2.0 mmHg AV VTI:            0.471 m AV Peak Grad:      17.0 mmHg AV Mean Grad:      10.0 mmHg LVOT Vmax:         106.00 cm/s LVOT Vmean:        73.700 cm/s LVOT VTI:          0.184 m LVOT/AV VTI ratio: 0.39  AORTA Ao Root diam: 3.00 cm MITRAL VALVE                         TRICUSPID VALVE MV Area (PHT): 2.50 cm              TR Peak grad:   60.5 mmHg MV Peak grad:  14.0 mmHg             TR Vmax:        433.00 cm/s MV Mean grad:  6.0 mmHg MV Vmax:       1.87 m/s              SHUNTS MV Vmean:      117.0 cm/s            Systemic VTI:  0.18 m MV VTI:        0.59 m                Systemic Diam: 1.80 cm MV PHT:        87.87 msec MV Decel Time: 303 msec MV E velocity: 164.00 cm/s 103 cm/s MV A velocity: 119.00 cm/s 70.3 cm/s MV E/A ratio:  1.38        1.5  Dwayne Prince Rome MD Electronically signed by Yolonda Kida MD Signature Date/Time: 07/12/2019/5:05:28 PM    Final  Time Spent in minutes  25  Kalei Mckillop M.D on 07/16/2019 at 1:30 PM  Between 7am to 7pm - Pager - [phone]  After 7pm go to [email or web address] - password [facility]  [facility] -  Office  [phone]

## 2019-07-16 NOTE — Progress Notes (Signed)
No specfic co's today. IV iron infusion completed and tolerated well. Informed consent for EGD and colonscopy obtained with pt verbalizing specific details and understanding of prep. First stool blackish green soft formed medium. Pt taking prep currently.

## 2019-07-16 NOTE — Anesthesia Preprocedure Evaluation (Addendum)
Anesthesia Evaluation  Patient identified by MRN, date of birth, ID band Patient awake    Reviewed: Allergy & Precautions, H&P , NPO status , reviewed documented beta blocker date and time   Airway Mallampati: IV  TM Distance: >3 FB Neck ROM: limited  Mouth opening: Limited Mouth Opening  Dental  (+) Edentulous Upper, Edentulous Lower   Pulmonary COPD,     + decreased breath sounds      Cardiovascular hypertension, + Peripheral Vascular Disease and +CHF  Normal cardiovascular exam+ dysrhythmias Atrial Fibrillation + pacemaker   07/12/2019 ECHO IMPRESSIONS    1. Left ventricular ejection fraction, by visual estimation, is 60 to 65%. The left ventricle has normal function. Left ventricular septal wall thickness was normal. Normal left ventricular posterior wall thickness. There is no left ventricular  hypertrophy.  2. The left ventricle has no regional wall motion abnormalities.  3. Global right ventricle has normal systolic function.The right ventricular size is normal. No increase in right ventricular wall thickness.  4. Left atrial size was normal.  5. Right atrial size was normal.  6. Moderate mitral annular calcification.  7. The mitral valve is myxomatous. Mild mitral valve regurgitation. Moderate mitral stenosis.  8. The tricuspid valve is grossly normal.  9. The aortic valve is grossly normal. Aortic valve regurgitation is trivial. Mild to moderate aortic valve sclerosis/calcification without any evidence of aortic stenosis. 10. The pulmonic valve was grossly normal. Pulmonic valve regurgitation is not visualized. 11. Severely elevated pulmonary artery systolic pressure. 12. A pacer wire is visualized. 13. Left ventricular ejection fraction by PLAX is is 62 % 14. The atrial septum is grossly normal.   Neuro/Psych    GI/Hepatic   Endo/Other  diabetes  Renal/GU Renal disease     Musculoskeletal   Abdominal    Peds  Hematology  (+) Blood dyscrasia, anemia ,   Anesthesia Other Findings Past Medical History: No date: CHF (congestive heart failure) (HCC) No date: Chronic kidney disease No date: COPD (chronic obstructive pulmonary disease) (HCC) No date: Diabetes mellitus without complication (HCC) No date: Hypertension  Past Surgical History: No date: aoric valve replacemet      Comment:  st Jude No date: CATARACT EXTRACTION, BILATERAL No date: PACEMAKER PLACEMENT No date: VALVE REPLACEMENT  BMI    Body Mass Index: 33.95 kg/m      Reproductive/Obstetrics                            Anesthesia Physical Anesthesia Plan  ASA: IV and emergent  Anesthesia Plan: General   Post-op Pain Management:    Induction: Intravenous  PONV Risk Score and Plan: Treatment may vary due to age or medical condition and TIVA  Airway Management Planned: Nasal Cannula and Natural Airway  Additional Equipment:   Intra-op Plan:   Post-operative Plan:   Informed Consent: I have reviewed the patients History and Physical, chart, labs and discussed the procedure including the risks, benefits and alternatives for the proposed anesthesia with the patient or authorized representative who has indicated his/her understanding and acceptance.     Dental Advisory Given  Plan Discussed with: CRNA  Anesthesia Plan Comments:         Anesthesia Quick Evaluation

## 2019-07-17 ENCOUNTER — Encounter
Admission: EM | Disposition: A | Discharge status: Home Health | Payer: Self-pay | Source: Home / Self Care | Attending: Internal Medicine

## 2019-07-17 ENCOUNTER — Inpatient Hospital Stay: Payer: Medicare Other | Admitting: Anesthesiology

## 2019-07-17 ENCOUNTER — Encounter: Payer: Self-pay | Admitting: Internal Medicine

## 2019-07-17 ENCOUNTER — Other Ambulatory Visit: Payer: Self-pay

## 2019-07-17 DIAGNOSIS — K449 Diaphragmatic hernia without obstruction or gangrene: Secondary | ICD-10-CM

## 2019-07-17 DIAGNOSIS — K5730 Diverticulosis of large intestine without perforation or abscess without bleeding: Secondary | ICD-10-CM

## 2019-07-17 DIAGNOSIS — D508 Other iron deficiency anemias: Secondary | ICD-10-CM

## 2019-07-17 DIAGNOSIS — K635 Polyp of colon: Secondary | ICD-10-CM

## 2019-07-17 DIAGNOSIS — D509 Iron deficiency anemia, unspecified: Secondary | ICD-10-CM | POA: Diagnosis present

## 2019-07-17 HISTORY — PX: COLONOSCOPY WITH PROPOFOL: SHX5780

## 2019-07-17 HISTORY — PX: ESOPHAGOGASTRODUODENOSCOPY: SHX5428

## 2019-07-17 LAB — GLUCOSE, CAPILLARY
Glucose-Capillary: 150 mg/dL — ABNORMAL HIGH (ref 70–99)
Glucose-Capillary: 140 mg/dL — ABNORMAL HIGH (ref 70–99)
Glucose-Capillary: 168 mg/dL — ABNORMAL HIGH (ref 70–99)
Glucose-Capillary: 212 mg/dL — ABNORMAL HIGH (ref 70–99)

## 2019-07-17 LAB — PROTIME-INR
INR: 1.8 — ABNORMAL HIGH (ref 0.8–1.2)
Prothrombin Time: 20.6 seconds — ABNORMAL HIGH (ref 11.4–15.2)

## 2019-07-17 LAB — HEMOGLOBIN AND HEMATOCRIT, BLOOD
HCT: 29.7 % — ABNORMAL LOW (ref 36.0–46.0)
Hemoglobin: 8.6 g/dL — ABNORMAL LOW (ref 12.0–15.0)

## 2019-07-17 SURGERY — EGD (ESOPHAGOGASTRODUODENOSCOPY)
Anesthesia: General

## 2019-07-17 MED ORDER — SODIUM CHLORIDE 0.9 % IV SOLN: INTRAVENOUS | Status: DC

## 2019-07-17 MED ORDER — CARVEDILOL 6.25 MG PO TABS: 3.1250 mg | ORAL_TABLET | Freq: Two times a day (BID) | ORAL | 3 refills | Status: DC

## 2019-07-17 MED ORDER — PROPOFOL 500 MG/50ML IV EMUL
INTRAVENOUS | Status: AC
  Filled 2019-07-17: qty 50

## 2019-07-17 MED ORDER — FUROSEMIDE 40 MG PO TABS
40.0000 mg | ORAL_TABLET | Freq: Two times a day (BID) | ORAL | Status: DC
  Filled 2019-07-17 (×2): qty 1

## 2019-07-17 MED ORDER — BUTAMBEN-TETRACAINE-BENZOCAINE 2-2-14 % EX AERO
INHALATION_SPRAY | CUTANEOUS | Status: AC
  Filled 2019-07-17: qty 5

## 2019-07-17 MED ORDER — WARFARIN SODIUM 5 MG PO TABS: 5.0000 mg | ORAL_TABLET | ORAL | 0 refills | Status: DC

## 2019-07-17 MED ORDER — INSULIN ASPART PROT & ASPART (70-30 MIX) 100 UNIT/ML ~~LOC~~ SUSP: 10.0000 [IU] | Freq: Two times a day (BID) | SUBCUTANEOUS | 11 refills | Status: DC

## 2019-07-17 MED ORDER — SODIUM CHLORIDE 0.9 % IV SOLN
300.0000 mg | Freq: Once | INTRAVENOUS | Status: AC
  Administered 2019-07-17: 15:00:00 300 mg via INTRAVENOUS
  Filled 2019-07-17: qty 15

## 2019-07-17 MED ORDER — PROPOFOL 500 MG/50ML IV EMUL
INTRAVENOUS | Status: DC | PRN
  Administered 2019-07-17: 75 ug/kg/min via INTRAVENOUS

## 2019-07-17 MED ORDER — PHENYLEPHRINE HCL (PRESSORS) 10 MG/ML IV SOLN
INTRAVENOUS | Status: DC | PRN
  Administered 2019-07-17 (×3): 100 ug via INTRAVENOUS

## 2019-07-17 MED ORDER — CARVEDILOL 3.125 MG PO TABS
6.2500 mg | ORAL_TABLET | Freq: Two times a day (BID) | ORAL | Status: DC
  Administered 2019-07-18: 6.25 mg via ORAL
  Filled 2019-07-17 (×2): qty 2

## 2019-07-17 NOTE — Discharge Summary (Signed)
Physician Discharge Summary  Kimberly Peterson [id number] DOB: February 03, 1930 DOA: 07/11/2019  PCP: [name], MD  Admit date: 07/11/2019 Discharge date: 07/17/2019  Admitted From: Home Disposition: Home  Recommendations for Outpatient Follow-up:  1. Follow up with PCP in 1-2 weeks.  Outpatient follow-up with palliative care. Follow-up with GI in 1 week for capsule study.  Home Health: RN and PT Equipment/Devices: 2 L via nasal cannula (chronic)  Discharge Condition: Fair CODE STATUS: DNR Diet recommendation: Heart Healthy / Carb Modified    Discharge Diagnoses:  Principal Problem:   Acute on chronic diastolic heart failure (HCC)  Active Problems:   Acute and chronic respiratory failure with hypoxia (HCC)   Type II diabetes mellitus with renal manifestations (HCC)   Microcytic anemia   Lactic acidosis   Hypokalemia   Elevated troponin   Acute renal failure superimposed on stage 3a chronic kidney disease (HCC)   Iron deficiency anemia Acute blood loss anemia   Pulmonary embolism (HCC)   Benign essential HTN   Chronic obstructive pulmonary disease (HCC)   Hyperlipidemia, mixed   Atrial fibrillation ([place])  Brief narrative/HPI 86 year old obese female with diastolic CHF, chronic kidney disease stage III, A. fib on Coumadin, s/p AV replacement, pacemaker due to CHB, COPD on 2 L home O2, diabetes mellitus and hypertension presented with dyspnea on exertion with findings of acute on chronic diastolic CHF.  In the ED she was in respiratory distress and admitted to hospital service.   Hospital course   Principal Problem:   Acute on chronic diastolic heart failure (HCC) Acute on chronic respiratory failure with hypoxia (HCC) Diuresed quite well with IV Lasix.  Now euvolemic.    Transition to home dose torsemide upon discharge.  Active Problems: Iron deficiency anemia/acute blood loss anemia Hemoglobin dropped to 6.4, baseline of 8. Hemoccult +. INR was supratherapeutic  past 3 days.   Received 1 unit PRBC with improvement.   Coumadin held.  GI consulted and patient underwent EGD and colonoscopy without any active bleeding or ulcer.  Showed few polyps. Okay for discharge from GI standpoint.  Recommends resuming Coumadin from tomorrow.  Plan on outpatient capsule in 1 week and follow-up with GI in 1 month. Monitor H&H as outpatient.  Chronic A. fib Rate controlled on Coreg and Cardizem.    INR supratherapeutic on Coumadin, held given concern for blood loss anemia, resume from 1/11.    Pulmonary embolism ,Chronic (HCC) Resume Coumadin from 1/11.  Essential hypertension Blood pressure soft.  On multiple blood pressure meds.  I have reduced her Coreg dose.  Continue Cardizem and torsemide.  Will discontinue hydralazine.    Chronic obstructive pulmonary disease with chronic hypoxic respiratory failure (HCC) Continue home inhaler.  On 2 L nasal cannula.  On BiPAP at night.     Type II diabetes mellitus with renal manifestations (HCC) Poor p.o. intake.  CBG low normal.  Reduced insulin dose to 10 units twice daily (was on 24-32 units twice daily).    Acute renal failure superimposed on stage 3a chronic kidney disease (HCC) Likely cardiorenal.  Improved with diuresis.  Hypokalemia Replenished.  Continue home supplement.  Goals of care discussion Appreciate palliative care consult.  Patient now made DNR after discussing with patient and her daughter with plan on outpatient follow-up with palliative care (is active with [facility]). PT recommends home health for generalized weakness.     Family Communication  : Daughter updated on the phone  Disposition Plan  : Home  Consults  :  Palliative care, GI  Procedures  : EGD and colonoscopy  Discharge Instructions   Allergies as of 07/17/2019   No Known Allergies     Medication List  Discontinue this medication Hydralazine   TAKE these medications   aspirin 81 MG tablet Take 81  mg by mouth daily.   atorvastatin 10 MG tablet Commonly known as: LIPITOR TAKE 1 TABLET BY MOUTH ONCE DAILY FOR CHOLESTEROL What changed:   how much to take  how to take this  when to take this  additional instructions   Cartia XT 240 MG 24 hr capsule Generic drug: diltiazem Take 1 capsule by mouth once daily What changed: how much to take   carvedilol 6.25 MG tablet Commonly known as: COREG Take 0.5  tablet (3.125 mg mg total) by mouth 2 (two) times daily with a meal. (Dose changed)     insulin aspart protamine- aspart (70-30) 100 UNIT/ML injection Commonly known as: NOVOLOG MIX 70/30 Inject 0.1 mLs (10 Units total) into the skin 2 (two) times daily with a meal. What changed:   how much to take  when to take this  additional instructions   pantoprazole 40 MG tablet Commonly known as: PROTONIX Take one tab po qd for stomach What changed:   how much to take  how to take this  when to take this  additional instructions   potassium chloride SA 20 MEQ tablet Commonly known as: KLOR-CON Take 20 mEq by mouth daily.   torsemide 20 MG tablet Commonly known as: [name] Take 2 tablets (40 mg total) by mouth daily.   warfarin 5 MG tablet Commonly known as: COUMADIN Take 1-1.5 tablets (5-7.5 mg total) by mouth See admin instructions. Take 1 tablet (5mg ) by mouth every Monday, Tuesday. Wednesday, Friday, Saturday and Sunday evening and take 1 tablets (7.5mg) by mouth every Thursday evening Start taking on: July 18, 2019      Follow-up Information    [facility] Follow up on 07/20/2019.   Specialty: Cardiology Why: at 9:30am. Enter through the Medical Mall entrance Contact information: [address] [place] [address] [phone]       Khan, Fozia M, MD Follow up in 1 week(s).   Specialty: Internal Medicine Contact information: [address] [place]  [address] [phone]        Vanga, Rohini Reddy, MD Follow up in 1 week(s).   Specialty: Gastroenterology Why: office will call Contact information: [address] [place] [address] [phone]          No Known Allergies    Procedures/Studies: DG Chest Portable 1 View  Result Date: 07/11/2019 CLINICAL DATA:  Worsening shortness of breath over the past 24 hours. EXAM: PORTABLE CHEST 1 VIEW COMPARISON:  Single-view of the chest 04/27/2019, 03/01/2019 and 12/03/2015. FINDINGS: Cardiomegaly and pulmonary vascular congestion are unchanged. Atherosclerosis is noted. Pacing device is in place. No acute or focal bony abnormality. IMPRESSION: 1. No change in cardiomegaly and pulmonary vascular congestion. 2. Atherosclerosis. Electronically Signed   By: Thomas  Dalessio M.D.   On: 07/11/2019 13:31   ECHOCARDIOGRAM COMPLETE  Result Date: 07/12/2019   ECHOCARDIOGRAM REPORT   Patient Name:   Kimberly Peterson Date of Exam: 07/12/2019 Medical Rec #:  [id number]       Height:       62.0 in Accession #:    [id number]      Weight:       19 2.0 lb  Date of Birth:  Oct 09, 1929        BSA:          1.88 m Patient Age:    3 years        BP:           136/76 mmHg Patient Gender: F               HR:           72 bpm. Exam Location:  [facility] Procedure: 2D Echo, Color Doppler and Cardiac Doppler Indications:     I50.31 CHF-Acute Diastolic  History:         Patient has prior history of Echocardiogram examinations, most                  recent 04/29/2019. CHF, COPD and CKD; Risk Factors:Hypertension                  and Diabetes.  Sonographer:     Charmayne Sheer RDCS (AE) Referring Phys:  Baker Janus Soledad Gerlach NIU Diagnosing Phys: Yolonda Kida MD  Sonographer Comments: Suboptimal parasternal window and no subcostal window. TDS due to pt sitting in recliner. Pt did not have IV access. IMPRESSIONS  1. Left ventricular ejection fraction, by visual estimation, is 60 to 65%. The left ventricle has normal function. Left ventricular  septal wall thickness was normal. Normal left ventricular posterior wall thickness. There is no left ventricular hypertrophy.  2. The left ventricle has no regional wall motion abnormalities.  3. Global right ventricle has normal systolic function.The right ventricular size is normal. No increase in right ventricular wall thickness.  4. Left atrial size was normal.  5. Right atrial size was normal.  6. Moderate mitral annular calcification.  7. The mitral valve is myxomatous. Mild mitral valve regurgitation. Moderate mitral stenosis.  8. The tricuspid valve is grossly normal.  9. The aortic valve is grossly normal. Aortic valve regurgitation is trivial. Mild to moderate aortic valve sclerosis/calcification without any evidence of aortic stenosis. 10. The pulmonic valve was grossly normal. Pulmonic valve regurgitation is not visualized. 11. Severely elevated pulmonary artery systolic pressure. 12. A pacer wire is visualized. 13. Left ventricular ejection fraction by PLAX is is 62 % 14. The atrial septum is grossly normal. FINDINGS  Left Ventricle: Left ventricular ejection fraction, by visual estimation, is 60 to 65%. Left ventricular ejection fraction by PLAX is is 62 % The left ventricle has normal function. The left ventricle has no regional wall motion abnormalities. Normal left ventricular posterior wall thickness. There is no left ventricular hypertrophy. Right Ventricle: The right ventricular size is normal. No increase in right ventricular wall thickness. Global RV systolic function is has normal systolic function. The tricuspid regurgitant velocity is 3.89 m/s, and with an assumed right atrial pressure  of 10 mmHg, the estimated right ventricular systolic pressure is severely elevated at 70.5 mmHg. Left Atrium: Left atrial size was normal in size. Right Atrium: Right atrial size was normal in size Pericardium: There is no evidence of pericardial effusion. Mitral Valve: The mitral valve is myxomatous.  Moderate mitral annular calcification. Mild mitral valve regurgitation. Moderate mitral valve stenosis by observation. MV peak gradient, 14.0 mmHg. Trivial MS. Tricuspid Valve: The tricuspid valve is grossly normal. Tricuspid valve regurgitation mild-moderate. Aortic Valve: The aortic valve is grossly normal. Aortic valve regurgitation is trivial. Mild to moderate aortic valve sclerosis/calcification is present, without any evidence of aortic stenosis. Aortic valve mean gradient measures 10.0 mmHg. Aortic valve  peak gradient measures 17.0 mmHg. Aortic valve area, by VTI measures 0.99 cm. Pulmonic Valve: The pulmonic valve was grossly normal. Pulmonic valve regurgitation is not visualized. Pulmonic regurgitation is not visualized. Aorta: The aortic root is normal in size and structure. IAS/Shunts: The atrial septum is grossly normal. Additional Comments: A pacer wire is visualized.  LEFT VENTRICLE PLAX 2D LV EF:         Left            Diastology                ventricular     LV e' lateral:   3.59 cm/s                ejection        LV E/e' lateral: 45.7                fraction by                PLAX is is                62 % LVIDd:         4.08 cm LVIDs:         2.72 cm LV PW:         0.84 cm LV IVS:        1.03 cm LVOT diam:     1.80 cm LV SV:         46 ml LV SV Index:   23.02 LVOT Area:     2.54 cm  LEFT ATRIUM             Index LA diam:        4.90 cm 2.61 cm/m LA Vol (A2C):   41.6 ml 22.14 ml/m LA Vol (A4C):   79.6 ml 42.37 ml/m LA Biplane Vol: 58.3 ml 31.03 ml/m  AORTIC VALVE                    PULMONIC VALVE AV Area (Vmax):    1.31 cm     PV Vmax:       0.83 m/s AV Area (Vmean):   1.28 cm     PV Vmean:      62.600 cm/s AV Area (VTI):     0.99 cm     PV VTI:        0.196 m AV Vmax:           206.00 cm/s  PV Peak grad:  2.8 mmHg AV Vmean:          147.000 cm/s PV Mean grad:  2.0 mmHg AV VTI:            0.471 m AV Peak Grad:      17.0 mmHg AV Mean Grad:      10.0 mmHg LVOT Vmax:         106.00 cm/s  LVOT Vmean:        73.700 cm/s LVOT VTI:          0.184 m LVOT/AV VTI ratio: 0.39  AORTA Ao Root diam: 3.00 cm MITRAL VALVE                         TRICUSPID VALVE MV Area (PHT): 2.50 cm              TR Peak grad:   60.5 mmHg MV Peak grad:  14.0 mmHg  TR Vmax:        433.00 cm/s MV Mean grad:  6.0 mmHg MV Vmax:       1.87 m/s              SHUNTS MV Vmean:      117.0 cm/s            Systemic VTI:  0.18 m MV VTI:        0.59 m                Systemic Diam: 1.80 cm MV PHT:        87.87 msec MV Decel Time: 303 msec MV E velocity: 164.00 cm/s 103 cm/s MV A velocity: 119.00 cm/s 70.3 cm/s MV E/A ratio:  1.38        1.5  Dwayne D Callwood MD Electronically signed by Yolonda Kida MD Signature Date/Time: 07/12/2019/5:05:28 PM    Final     (Echo, Carotid, EGD, Colonoscopy, ERCP)    Subjective:   Discharge Exam: Vitals:   07/17/19 1328 07/17/19 1338  BP:  (!) 142/57  Pulse: 85 85  Resp: 16   Temp:    SpO2: 94% 95%   Vitals:   07/17/19 1148 07/17/19 1324 07/17/19 1328 07/17/19 1338  BP: (!) 161/64 (!) 117/40  (!) 142/57  Pulse: 85 83 85 85  Resp: 20 (!) 27 16   Temp: 98.3 F (36.8 C) 98 F (36.7 C)    TempSrc: Temporal     SpO2: 97% 100% 94% 95%  Weight: 81.1 kg     Height: 5\' 2"  (1.575 m)       General: Elderly female not in distress HEENT: Pallor present, moist mucosa, supple neck Chest: Clear CVs: Normal S1-S2 GI: Soft, nondistended, nontender Musculoskeletal: Warm, no edema    The results of significant diagnostics from this hospitalization (including imaging, microbiology, ancillary and laboratory) are listed below for reference.     Microbiology: Recent Results (from the past 240 hour(s))  Respiratory Panel by RT PCR (Flu A&B, Covid) - Nasopharyngeal Swab     Status: None   Collection Time: 07/11/19  1:07 PM   Specimen: Nasopharyngeal Swab  Result Value Ref Range Status   SARS Coronavirus 2 by RT PCR NEGATIVE NEGATIVE Final    Comment: (NOTE) SARS-CoV-2  target nucleic acids are NOT DETECTED. The SARS-CoV-2 RNA is generally detectable in upper respiratoy specimens during the acute phase of infection. The lowest concentration of SARS-CoV-2 viral copies this assay can detect is 131 copies/mL. A negative result does not preclude SARS-Cov-2 infection and should not be used as the sole basis for treatment or other patient management decisions. A negative result may occur with  improper specimen collection/handling, submission of specimen other than nasopharyngeal swab, presence of viral mutation(s) within the areas targeted by this assay, and inadequate number of viral copies (<131 copies/mL). A negative result must be combined with clinical observations, patient history, and epidemiological information. The expected result is Negative. Fact Sheet for Patients:  PinkCheek.be Fact Sheet for Healthcare Providers:  GravelBags.it This test is not yet ap proved or cleared by the [place] FDA and  has been authorized for detection and/or diagnosis of SARS-CoV-2 by FDA under an Emergency Use Authorization (EUA). This EUA will remain  in effect (meaning this test can be used) for the duration of the COVID-19 declaration under Section 564(b)(1) of the Act, 21 U.S.C. section 360bbb-3(b)(1), unless the authorization is terminated or revoked sooner.    Influenza  A by PCR NEGATIVE NEGATIVE Final   Influenza B by PCR NEGATIVE NEGATIVE Final    Comment: (NOTE) The Xpert Xpress SARS-CoV-2/FLU/RSV assay is intended as an aid in  the diagnosis of influenza from Nasopharyngeal swab specimens and  should not be used as a sole basis for treatment. Nasal washings and  aspirates are unacceptable for Xpert Xpress SARS-CoV-2/FLU/RSV  testing. Fact Sheet for Patients: PinkCheek.be Fact Sheet for Healthcare Providers: GravelBags.it This test is  not yet approved or cleared by the [place] FDA and  has been authorized for detection and/or diagnosis of SARS-CoV-2 by  FDA under an Emergency Use Authorization (EUA). This EUA will remain  in effect (meaning this test can be used) for the duration of the  Covid-19 declaration under Section 564(b)(1) of the Act, 21  U.S.C. section 360bbb-3(b)(1), unless the authorization is  terminated or revoked. Performed at [facility], [address] [place], [place] [address]   MRSA PCR Screening     Status: None   Collection Time: 07/11/19  6:54 PM   Specimen: Nasal Mucosa; Nasopharyngeal  Result Value Ref Range Status   MRSA by PCR NEGATIVE NEGATIVE Final    Comment:        The GeneXpert MRSA Assay (FDA approved for NASAL specimens only), is one component of a comprehensive MRSA colonization surveillance program. It is not intended to diagnose MRSA infection nor to guide or monitor treatment for MRSA infections. Performed at [facility], [address] [place], [place] [address]      Labs: BNP (last 3 results) Recent Labs    04/28/19 0528 04/29/19 0245 07/11/19 1242  BNP 308.0* 255.0* [id number]*   Basic Metabolic Panel: Recent Labs  Lab 07/12/19 0502 07/13/19 0441 07/14/19 0346 07/15/19 0239 07/16/19 0444  NA 144 142 142 142 139  K 4.0 3.7 3.8 3.5 3.5  CL 102 100 99 99 100  CO2 30 33* 31 32 32  GLUCOSE 126* 73 128* 103* 169*  BUN 26* 27* 28* 25* 18  CREATININE 1.21* 1.23* 1.35* 1.35* 1.10*  CALCIUM 9.1 9.1 8.9 8.6* 8.8*  MG 2.3  --   --   --   --    Liver Function Tests: Recent Labs  Lab 07/11/19 1241  AST 25  ALT 12  ALKPHOS 72  BILITOT 0.8  PROT 8.4*  ALBUMIN 3.6   No results for input(s): LIPASE, AMYLASE in the last 168 hours. No results for input(s): AMMONIA in the last 168 hours. CBC: Recent Labs  Lab 07/11/19 1241 07/13/19 0441 07/14/19 0346 07/14/19 1932 07/15/19 0239 07/16/19 0444 07/17/19 0538  WBC 8.5 9.8 8.0   --  8.8 9.0  --   NEUTROABS 5.4  --   --   --   --   --   --   HGB 7.7* 7.1* 6.4* 7.9* 7.8* 8.1* 8.6*  HCT 27.2* 25.3* 22.8* 26.9* 26.3* 27.6* 29.7*  MCV 75.8* 74.9* 74.3*  --  75.4* 76.2*  --   PLT 402* 319 298  --  274 279  --    Cardiac Enzymes: No results for input(s): CKTOTAL, CKMB, CKMBINDEX, TROPONINI in the last 168 hours. BNP: Invalid input(s): POCBNP CBG: Recent Labs  Lab 07/16/19 1137 07/16/19 1637 07/16/19 2134 07/17/19 0728 07/17/19 1151  GLUCAP 179* 246* 138* 150* 140*   D-Dimer No results for input(s): DDIMER in the last 72 hours. Hgb A1c No results for input(s): HGBA1C in the last 72 hours. Lipid Profile No results for  input(s): CHOL, HDL, LDLCALC, TRIG, CHOLHDL, LDLDIRECT in the last 72 hours. Thyroid function studies No results for input(s): TSH, T4TOTAL, T3FREE, THYROIDAB in the last 72 hours.  Invalid input(s): FREET3 Anemia work up Recent Labs    07/16/19 1313  VITAMINB12 473  FOLATE 11.7   Urinalysis    Component Value Date/Time   COLORURINE YELLOW (A) 07/11/2019 1242   APPEARANCEUR CLEAR (A) 07/11/2019 1242   APPEARANCEUR Clear 06/09/2018 1502   LABSPEC 1.013 07/11/2019 1242   LABSPEC 1.012 09/07/2013 1418   PHURINE 5.0 07/11/2019 1242   GLUCOSEU NEGATIVE 07/11/2019 1242   GLUCOSEU Negative 09/07/2013 1418   HGBUR NEGATIVE 07/11/2019 1242   BILIRUBINUR NEGATIVE 07/11/2019 1242   BILIRUBINUR Negative 06/09/2018 1502   BILIRUBINUR Negative 09/07/2013 1418   KETONESUR NEGATIVE 07/11/2019 1242   PROTEINUR 100 (A) 07/11/2019 1242   UROBILINOGEN 0.2 11/21/2013 1253   NITRITE NEGATIVE 07/11/2019 1242   LEUKOCYTESUR NEGATIVE 07/11/2019 1242   LEUKOCYTESUR 3+ 09/07/2013 1418   Sepsis Labs Invalid input(s): PROCALCITONIN,  WBC,  LACTICIDVEN Microbiology Recent Results (from the past 240 hour(s))  Respiratory Panel by RT PCR (Flu A&B, Covid) - Nasopharyngeal Swab     Status: None   Collection Time: 07/11/19  1:07 PM   Specimen:  Nasopharyngeal Swab  Result Value Ref Range Status   SARS Coronavirus 2 by RT PCR NEGATIVE NEGATIVE Final    Comment: (NOTE) SARS-CoV-2 target nucleic acids are NOT DETECTED. The SARS-CoV-2 RNA is generally detectable in upper respiratoy specimens during the acute phase of infection. The lowest concentration of SARS-CoV-2 viral copies this assay can detect is 131 copies/mL. A negative result does not preclude SARS-Cov-2 infection and should not be used as the sole basis for treatment or other patient management decisions. A negative result may occur with  improper specimen collection/handling, submission of specimen other than nasopharyngeal swab, presence of viral mutation(s) within the areas targeted by this assay, and inadequate number of viral copies (<131 copies/mL). A negative result must be combined with clinical observations, patient history, and epidemiological information. The expected result is Negative. Fact Sheet for Patients:  PinkCheek.be Fact Sheet for Healthcare Providers:  GravelBags.it This test is not yet ap proved or cleared by the [place] FDA and  has been authorized for detection and/or diagnosis of SARS-CoV-2 by FDA under an Emergency Use Authorization (EUA). This EUA will remain  in effect (meaning this test can be used) for the duration of the COVID-19 declaration under Section 564(b)(1) of the Act, 21 U.S.C. section 360bbb-3(b)(1), unless the authorization is terminated or revoked sooner.    Influenza A by PCR NEGATIVE NEGATIVE Final   Influenza B by PCR NEGATIVE NEGATIVE Final    Comment: (NOTE) The Xpert Xpress SARS-CoV-2/FLU/RSV assay is intended as an aid in  the diagnosis of influenza from Nasopharyngeal swab specimens and  should not be used as a sole basis for treatment. Nasal washings and  aspirates are unacceptable for Xpert Xpress SARS-CoV-2/FLU/RSV  testing. Fact Sheet for  Patients: PinkCheek.be Fact Sheet for Healthcare Providers: GravelBags.it This test is not yet approved or cleared by the [place] FDA and  has been authorized for detection and/or diagnosis of SARS-CoV-2 by  FDA under an Emergency Use Authorization (EUA). This EUA will remain  in effect (meaning this test can be used) for the duration of the  Covid-19 declaration under Section 564(b)(1) of the Act, 21  U.S.C. section 360bbb-3(b)(1), unless the authorization is  terminated or revoked. Performed at [facility],  [place], [place] [address]   MRSA PCR Screening     Status: None   Collection Time: 07/11/19  6:54 PM   Specimen: Nasal Mucosa; Nasopharyngeal  Result Value Ref Range Status   MRSA by PCR NEGATIVE NEGATIVE Final    Comment:        The GeneXpert MRSA Assay (FDA approved for NASAL specimens only), is one component of a comprehensive MRSA colonization surveillance program. It is not intended to diagnose MRSA infection nor to guide or monitor treatment for MRSA infections. Performed at [facility], [address], [place], [place] [address]      Time coordinating discharge: 35 minutes  SIGNED:   Louellen Molder, MD  [facility] 07/17/2019, 1:50 PM Pager   If 7PM-7AM, please contact night-coverage [email or web address] Password [email or web address]

## 2019-07-17 NOTE — Consult Note (Signed)
ANTICOAGULATION CONSULT NOTE  Pharmacy Consult for warfarin dosing Indication: atrial fibrillation  Patient Measurements: Height: 5\' 2"  (157.5 cm) Weight: 178 lb 12.7 oz (81.1 kg) IBW/kg (Calculated) : 50.1  Vital Signs: Temp: 98.2 F (36.8 C) (01/10 0729) Temp Source: Oral (01/10 0729) BP: 140/60 (01/10 0729) Pulse Rate: 83 (01/10 0729)  Labs: Recent Labs    07/15/19 0239 07/16/19 0444 07/17/19 0538  HGB 7.8* 8.1* 8.6*  HCT 26.3* 27.6* 29.7*  PLT 274 279  --   LABPROT 28.3* 21.3* 20.6*  INR 2.7* 1.9* 1.8*  CREATININE 1.35* 1.10*  --     Estimated Creatinine Clearance: 35.6 mL/min (A) (by C-G formula based on SCr of 1.1 mg/dL (H)).   Assessment: 86 year old female with PMH afib and PE on warfarin PTA admitted with AECHF. Home dose of warfarin 5 mg daily except 7.5 mg on Thursday. Pharmacy consulted to continue warfarin in this patient. There are no new DDIs since the previous note. Warfarin has been held for the past 4 days secondary to elevated INRs.   Goal of Therapy:  INR 2-3 Monitor platelets by anticoagulation protocol: Yes   Plan:  Warfarin order d/c'd by MD with note: Please hold coumadin to achieve INR <1.5 FOR egd/ Colonoscopy  INR 1.8 today. Will continue to hold warfarin and check INR daily. Please reconsult pharmacy when appropriate to restart warfarin after procedure.  Pharmacy will continue to follow.  Tawnya Crook, PharmD 07/17/2019 7:57 AM

## 2019-07-17 NOTE — Anesthesia Postprocedure Evaluation (Signed)
Anesthesia Post Note  Patient: Kimberly Peterson  Procedure(s) Performed: ESOPHAGOGASTRODUODENOSCOPY (EGD) (N/A ) COLONOSCOPY WITH PROPOFOL (N/A )  Patient location during evaluation: PACU Anesthesia Type: General Level of consciousness: awake and alert Pain management: pain level controlled Vital Signs Assessment: post-procedure vital signs reviewed and stable Respiratory status: spontaneous breathing, nonlabored ventilation, respiratory function stable and patient connected to nasal cannula oxygen Cardiovascular status: blood pressure returned to baseline and stable Postop Assessment: no apparent nausea or vomiting Anesthetic complications: no     Last Vitals:  Vitals:   07/17/19 1445 07/17/19 1450  BP:  (!) 147/52  Pulse:  82  Resp: 16 16  Temp:  37.2 C  SpO2: 91% 100%    Last Pain:  Vitals:   07/17/19 1450  TempSrc: Oral  PainSc:                  Alphonsus Sias

## 2019-07-17 NOTE — Progress Notes (Signed)
EGD and colon overall unremarkable except for small colon polyps, no bleeding source identified. Resume diet, resume Coumadin tomorrow. Okay to discharge home from GI standpoint. Outpatient capsule in a week and follow-up with GI in a month.  Cephas Darby, MD [address]  [place]  [place], [place] [address]  Main: [phone]  Fax: [phone] Pager: [phone]

## 2019-07-17 NOTE — Op Note (Signed)
[facility] Gastroenterology Patient Name: Kimberly Peterson Procedure Date: 07/17/2019 12:24 PM MRN: [id number] Account #: [id number] Date of Birth: 30-Jan-1930 Admit Type: Inpatient Age: 86 Room: [facility] ENDO ROOM 4 Gender: Female Note Status: Finalized Procedure:             Upper GI endoscopy Indications:           Suspected upper gastrointestinal bleeding in patient                         with unexplained iron deficiency anemia, Unexplained                         iron deficiency anemia Providers:             Lin Landsman MD, MD Referring MD:          [name], MD (Referring MD) Medicines:             Monitored Anesthesia Care Complications:         No immediate complications. Estimated blood loss: None. Procedure:             Pre-Anesthesia Assessment:                        - Prior to the procedure, a History and Physical was                         performed, and patient medications and allergies were                         reviewed. The patient is competent. The risks and                         benefits of the procedure and the sedation options and                         risks were discussed with the patient. All questions                         were answered and informed consent was obtained.                         Patient identification and proposed procedure were                         verified by the physician, the nurse, the                         anesthesiologist, the anesthetist and the technician                         in the pre-procedure area in the procedure room in the                         endoscopy suite. Mental Status Examination: alert and                         oriented. Airway Examination: normal oropharyngeal  airway and neck mobility. Respiratory Examination:                         clear to auscultation. CV Examination: irregularly                         irregular rate and rhythm. Prophylactic  Antibiotics:                         The patient does not require prophylactic antibiotics.                         Prior Anticoagulants: The patient has taken Coumadin                         (warfarin), last dose was 3 days prior to procedure.                         ASA Grade Assessment: IV - A patient with severe                         systemic disease that is a constant threat to life.                         After reviewing the risks and benefits, the patient                         was deemed in satisfactory condition to undergo the                         procedure. The anesthesia plan was to use monitored                         anesthesia care (MAC). Immediately prior to                         administration of medications, the patient was                         re-assessed for adequacy to receive sedatives. The                         heart rate, respiratory rate, oxygen saturations,                         blood pressure, adequacy of pulmonary ventilation, and                         response to care were monitored throughout the                         procedure. The physical status of the patient was                         re-assessed after the procedure.                        After obtaining informed consent, the endoscope was  passed under direct vision. Throughout the procedure,                         the patient's blood pressure, pulse, and oxygen                         saturations were monitored continuously. The Endoscope                         was introduced through the mouth, and advanced to the                         second part of duodenum. The upper GI endoscopy was                         accomplished without difficulty. The patient tolerated                         the procedure fairly well. Findings:      The duodenal bulb and second portion of the duodenum were normal.      A small hiatal hernia was present.      The stomach was  normal.      The cardia and gastric fundus were normal on retroflexion.      The gastroesophageal junction and examined esophagus were normal. Impression:            - Normal duodenal bulb and second portion of the                         duodenum.                        - Small hiatal hernia.                        - Normal stomach.                        - Normal gastroesophageal junction and esophagus.                        - No specimens collected. Recommendation:        - Perform a colonoscopy as previously scheduled. Procedure Code(s):     --- Professional ---                        [id number], Esophagogastroduodenoscopy, flexible,                         transoral; diagnostic, including collection of                         specimen(s) by brushing or washing, when performed                         (separate procedure) Diagnosis Code(s):     --- Professional ---                        K44.9, Diaphragmatic hernia without obstruction or  gangrene                        D50.9, Iron deficiency anemia, unspecified CPT copyright 2019 American Medical Association. All rights reserved. The codes documented in this report are preliminary and upon coder review may  be revised to meet current compliance requirements. Dr. Ulyess Mort Lin Landsman MD, MD 07/17/2019 12:39:39 PM This report has been signed electronically. Number of Addenda: 0 Note Initiated On: 07/17/2019 12:24 PM Estimated Blood Loss:  Estimated blood loss: none.      [facility]

## 2019-07-17 NOTE — Progress Notes (Signed)
Bipap refused 

## 2019-07-17 NOTE — Progress Notes (Signed)
Report to [employer] on the floor.  15 minute call for patient.

## 2019-07-17 NOTE — Op Note (Signed)
[facility] Gastroenterology Patient Name: Kimberly Peterson Procedure Date: 07/17/2019 12:24 PM MRN: [id number] Account #: [id number] Date of Birth: 1930-05-02 Admit Type: Inpatient Age: 86 Room: [facility] ENDO ROOM 4 Gender: Female Note Status: Finalized Procedure:             Colonoscopy Indications:           Unexplained iron deficiency anemia Providers:             Lin Landsman MD, MD Referring MD:          [name], MD (Referring MD) Medicines:             Monitored Anesthesia Care Complications:         No immediate complications. Estimated blood loss: None. Procedure:             Pre-Anesthesia Assessment:                        - Prior to the procedure, a History and Physical was                         performed, and patient medications and allergies were                         reviewed. The patient is competent. The risks and                         benefits of the procedure and the sedation options and                         risks were discussed with the patient. All questions                         were answered and informed consent was obtained.                         Patient identification and proposed procedure were                         verified by the physician, the nurse, the                         anesthesiologist, the anesthetist and the technician                         in the pre-procedure area in the procedure room in the                         endoscopy suite. Mental Status Examination: alert and                         oriented. Airway Examination: normal oropharyngeal                         airway and neck mobility. Respiratory Examination:                         clear to auscultation. CV Examination: irregularly  irregular rate and rhythm. Prophylactic Antibiotics:                         The patient does not require prophylactic antibiotics.                         Prior Anticoagulants: The patient has  taken Coumadin                         (warfarin), last dose was 3 days prior to procedure.                         ASA Grade Assessment: IV - A patient with severe                         systemic disease that is a constant threat to life.                         After reviewing the risks and benefits, the patient                         was deemed in satisfactory condition to undergo the                         procedure. The anesthesia plan was to use monitored                         anesthesia care (MAC). Immediately prior to                         administration of medications, the patient was                         re-assessed for adequacy to receive sedatives. The                         heart rate, respiratory rate, oxygen saturations,                         blood pressure, adequacy of pulmonary ventilation, and                         response to care were monitored throughout the                         procedure. The physical status of the patient was                         re-assessed after the procedure.                        After obtaining informed consent, the colonoscope was                         passed under direct vision. Throughout the procedure,                         the patient's blood pressure, pulse, and oxygen  saturations were monitored continuously. The                         Colonoscope was introduced through the anus and                         advanced to the the cecum, identified by appendiceal                         orifice and ileocecal valve. The colonoscopy was                         performed without difficulty. The patient tolerated                         the procedure well. The quality of the bowel                         preparation was good. Findings:      Skin tags were found on perianal exam.      The terminal ileum appeared normal.      Three sessile polyps were found in the descending colon and cecum. The        polyps were 3 to 5 mm in size. These polyps were removed with a cold       snare. Resection and retrieval were complete.      A few diverticula were found in the sigmoid colon.      Non-bleeding external and internal hemorrhoids were found during       retroflexion. The hemorrhoids were large. Impression:            - Perianal skin tags found on perianal exam.                        - The examined portion of the ileum was normal.                        - Three 3 to 5 mm polyps in the descending colon and                         in the cecum, removed with a cold snare. Resected and                         retrieved.                        - Diverticulosis in the sigmoid colon.                        - Non-bleeding external and internal hemorrhoids. Recommendation:        - Return patient to hospital ward for ongoing care.                        - Advance diet as tolerated today.                        - Continue present medications.                        -  Await pathology results.                        - Return to GI office in 1 month.                        - To visualize the small bowel, perform video capsule                         endoscopy in 1 week.                        - Resume Coumadin (warfarin) at prior dose tomorrow.                         Refer to managing physician for further adjustment of                         therapy. Procedure Code(s):     --- Professional ---                        [phone], Colonoscopy, flexible; with removal of                         tumor(s), polyp(s), or other lesion(s) by snare                         technique Diagnosis Code(s):     --- Professional ---                        K64.8, Other hemorrhoids                        K63.5, Polyp of colon                        K64.4, Residual hemorrhoidal skin tags                        D50.9, Iron deficiency anemia, unspecified                        K57.30, Diverticulosis of large intestine  without                         perforation or abscess without bleeding CPT copyright 2019 American Medical Association. All rights reserved. The codes documented in this report are preliminary and upon coder review may  be revised to meet current compliance requirements. Dr. Ulyess Mort Lin Landsman MD, MD 07/17/2019 1:15:53 PM This report has been signed electronically. Number of Addenda: 0 Note Initiated On: 07/17/2019 12:24 PM Scope Withdrawal Time: 0 hours 24 minutes 0 seconds  Total Procedure Duration: 0 hours 27 minutes 35 seconds  Estimated Blood Loss:  Estimated blood loss: none.      [facility]

## 2019-07-17 NOTE — Progress Notes (Signed)
Pt returned to floor post EGD and colonscopy with some 02 desats. Pt had pulse ox 90-91 % on 02 @ 2l/Hart initially but improved with deep breathing and stimulus. Pt received IV iron infusion which has completed. Camera procedure being discussed with pt. Tentative discharge to home tomorrow with case management aware.

## 2019-07-17 NOTE — Transfer of Care (Signed)
Immediate Anesthesia Transfer of Care Note  Patient: Kimberly Peterson  Procedure(s) Performed: ESOPHAGOGASTRODUODENOSCOPY (EGD) (N/A ) COLONOSCOPY WITH PROPOFOL (N/A )  Patient Location: PACU  Anesthesia Type:General  Level of Consciousness: awake, alert  and oriented  Airway & Oxygen Therapy: Patient connected to face mask oxygen  Post-op Assessment: Report given to RN and Post -op Vital signs reviewed and stable  Post vital signs: Reviewed and stable  Last Vitals:  Vitals Value Taken Time  BP 117/40 07/17/19 1324  Temp 36.7 C 07/17/19 1324  Pulse 85 07/17/19 1328  Resp 16 07/17/19 1328  SpO2 94 % 07/17/19 1328    Last Pain:  Vitals:   07/17/19 1324  TempSrc:   PainSc: 0-No pain         Complications: No apparent anesthesia complications

## 2019-07-17 NOTE — Discharge Instructions (Signed)
Heart Failure, Self Care Heart failure is a serious condition. This sheet explains things you need to do to take care of yourself at home. To help you stay as healthy as possible, you may be asked to change your diet, take certain medicines, and make other changes in your life. Your doctor may also give you more specific instructions. If you have problems or questions, call your doctor. What are the risks? Having heart failure makes it more likely for you to have some problems. These problems can get worse if you do not take good care of yourself. Problems may include:  Blood clotting problems. This may cause a stroke.  Damage to the kidneys, liver, or lungs.  Abnormal heart rhythms. Supplies needed:  Scale for weighing yourself.  Blood pressure monitor.  Notebook.  Medicines. How to care for yourself when you have heart failure Medicines Take over-the-counter and prescription medicines only as told by your doctor. Take your medicines every day.  Do not stop taking your medicine unless your doctor tells you to do so.  Do not skip any medicines.  Get your prescriptions refilled before you run out of medicine. This is important. Eating and drinking   Eat heart-healthy foods. Talk with a diet specialist (dietitian) to create an eating plan.  Choose foods that: ? Have no trans fat. ? Are low in saturated fat and cholesterol.  Choose healthy foods, such as: ? Fresh or frozen fruits and vegetables. ? Fish. ? Low-fat (lean) meats. ? Legumes, such as beans, peas, and lentils. ? Fat-free or low-fat dairy products. ? Whole-grain foods. ? High-fiber foods.  Limit salt (sodium) if told by your doctor. Ask your diet specialist to tell you which seasonings are healthy for your heart.  Cook in healthy ways instead of frying. Healthy ways of cooking include roasting, grilling, broiling, baking, poaching, steaming, and stir-frying.  Limit how much fluid you drink, if told by your  doctor. Alcohol use  Do not drink alcohol if: ? Your doctor tells you not to drink. ? Your heart was damaged by alcohol, or you have very bad heart failure. ? You are pregnant, may be pregnant, or are planning to become pregnant.  If you drink alcohol: ? Limit how much you use to:  0-1 drink a day for women.  0-2 drinks a day for men. ? Be aware of how much alcohol is in your drink. In the [place] one drink equals one 12 oz bottle of beer (355 mL), one 5 oz glass of wine (148 mL), or one 1 oz glass of hard liquor (44 mL). Lifestyle   Do not use any products that contain nicotine or tobacco, such as cigarettes, e-cigarettes, and chewing tobacco. If you need help quitting, ask your doctor. ? Do not use nicotine gum or patches before talking to your doctor.  Do not use illegal drugs.  Lose weight if told by your doctor.  Do physical activity if told by your doctor. Talk to your doctor before you begin an exercise if: ? You are an older adult. ? You have very bad heart failure.  Learn to manage stress. If you need help, ask your doctor.  Get rehab (rehabilitation) to help you stay independent and to help with your quality of life.  Plan time to rest when you get tired. Check weight and blood pressure   Weigh yourself every day. This will help you to know if fluid is building up in your body. ? Weigh yourself every morning   after you pee (urinate) and before you eat breakfast. ? Wear the same amount of clothing each time. ? Write down your daily weight. Give your record to your doctor.  Check and write down your blood pressure as told by your doctor.  Check your pulse as told by your doctor. Dealing with very hot and very cold weather  If it is very hot: ? Avoid activities that take a lot of energy. ? Use air conditioning or fans, or find a cooler place. ? Avoid caffeine and alcohol. ? Wear clothing that is loose-fitting, lightweight, and light-colored.  If it is very  cold: ? Avoid activities that take a lot of energy. ? Layer your clothes. ? Wear mittens or gloves, a hat, and a scarf when you go outside. ? Avoid alcohol. Follow these instructions at home:  Stay up to date with shots (vaccines). Get pneumococcal and flu (influenza) shots.  Keep all follow-up visits as told by your doctor. This is important. Contact a doctor if:  You gain weight quickly.  You have increasing shortness of breath.  You cannot do your normal activities.  You get tired easily.  You cough a lot.  You don't feel like eating or feel like you may vomit (nauseous).  You become puffy (swell) in your hands, feet, ankles, or belly (abdomen).  You cannot sleep well because it is hard to breathe.  You feel like your heart is beating fast (palpitations).  You get dizzy when you stand up. Get help right away if:  You have trouble breathing.  You or someone else notices a change in your behavior, such as having trouble staying awake.  You have chest pain or discomfort.  You pass out (faint). These symptoms may be an emergency. Do not wait to see if the symptoms will go away. Get medical help right away. Call your local emergency services (911 in the [place] Do not drive yourself to the hospital. Summary  Heart failure is a serious condition. To care for yourself, you may have to change your diet, take medicines, and make other lifestyle changes.  Take your medicines every day. Do not stop taking them unless your doctor tells you to do so.  Eat heart-healthy foods, such as fresh or frozen fruits and vegetables, fish, lean meats, legumes, fat-free or low-fat dairy products, and whole-grain or high-fiber foods.  Ask your doctor if you can drink alcohol. You may have to stop alcohol use if you have very bad heart failure.  Contact your doctor if you gain weight quickly or feel that your heart is beating too fast. Get help right away if you pass out, or have chest pain  or trouble breathing. This information is not intended to replace advice given to you by your health care provider. Make sure you discuss any questions you have with your health care provider. Document Revised: 10/05/2018 Document Reviewed: 10/06/2018 Elsevier Patient Education  2020 Elsevier Inc.  

## 2019-07-17 NOTE — Progress Notes (Signed)
Finished bowel prep. Water with urine colon being expelled. NPO after midnight

## 2019-07-17 NOTE — Progress Notes (Signed)
1/2 of SSE adm. Patient unable to hold enema. Clear yellow liquid expelled on bed.

## 2019-07-18 ENCOUNTER — Encounter: Payer: Self-pay | Admitting: *Deleted

## 2019-07-18 LAB — GLUCOSE, CAPILLARY
Glucose-Capillary: 159 mg/dL — ABNORMAL HIGH (ref 70–99)
Glucose-Capillary: 157 mg/dL — ABNORMAL HIGH (ref 70–99)

## 2019-07-18 LAB — PROTIME-INR
INR: 1.7 — ABNORMAL HIGH (ref 0.8–1.2)
Prothrombin Time: 19.4 seconds — ABNORMAL HIGH (ref 11.4–15.2)

## 2019-07-18 NOTE — TOC Transition Note (Signed)
Transition of Care [facility]) - CM/SW Discharge Note   Patient Details  Name: Kimberly Peterson MRN: [id number] Date of Birth: [birth year]  Transition of Care [facility]) CM/SW Contact:  Candie Chroman, LCSW Phone Number: 07/18/2019, 10:35 AM   Clinical Narrative: Patient has orders to discharge home today. [facility] is aware and has the orders they need. No further concerns. CSW signing off.    Final next level of care: [place] Barriers to Discharge: Barriers Resolved   Patient Goals and CMS Choice Patient states their goals for this hospitalization and ongoing recovery are:: go home      Discharge Placement                    Patient and family notified of of transfer: 07/18/19  Discharge Plan and Services   Discharge Planning Services: CM Consult            DME Arranged: N/A         HH Arranged: PT, RN [place] Agency: [facility] [employer] [facility] [employer] Date [place]: 07/18/19 Time [place]: [place] Representative spoke with at [place]: [place] (SDOH) Interventions     Readmission Risk Interventions Readmission Risk Prevention Plan 07/13/2019 03/05/2019 03/03/2019  Transportation Screening Complete Complete Complete  PCP or Specialist Appt within 3-5 Days - Complete -  [place]RI or [place] - Complete Complete  Social Work Consult for [place] Planning/Counseling - Complete -  Palliative Care Screening - Not Applicable Complete  Medication Review Press photographer) Referral to Pharmacy Complete Complete  [place] or Home Care Consult Complete - -  Palliative Care Screening Complete - -  [place] Not Complete - -  Some recent data might be hidden

## 2019-07-18 NOTE — Care Management Important Message (Signed)
Important Message  Patient Details  Name: Kimberly Peterson MRN: [id number] Date of Birth: October 01, 1929   Medicare Important Message Given:  Yes     [name] 07/18/2019, 11:05 AM

## 2019-07-18 NOTE — Progress Notes (Signed)
Patient seen and examined. Patient somnolent post anesthesia after EGD yesterday afternoon so couldn't be safely discharged home last evening.  Vitals stable this am including CBG  physical exam unremarkable ( unchanged from yesterday)   Stable for d/c home. See summary for details.

## 2019-07-18 NOTE — Progress Notes (Signed)
Escorted to personal vehicle

## 2019-07-19 ENCOUNTER — Telehealth: Payer: Self-pay | Admitting: Family

## 2019-07-19 LAB — SURGICAL PATHOLOGY

## 2019-07-19 NOTE — Telephone Encounter (Signed)
Spoke with Kimberly Peterson and her daughter. Patient states she is feeling ok since she left hospital but very tired and weak. She is taking medications as prescribed, checking her weight, and following a low sodium diet. She is not having any other Heart Failure Symptoms since d/c. Since she just got out of hospital her daughter is refusing to bring her in person but did confirm her virtual f/u appt for 1/13.   Alyse Low, [place]

## 2019-07-19 NOTE — Telephone Encounter (Signed)
Virtual Visit Pre-Appointment Phone Call  Ms Clasen, I am calling you today to discuss your upcoming appointment. We are currently trying to limit exposure to the virus that causes COVID-19 by seeing patients at home rather than in the office."  1. "What is the BEST phone number to call the day of the visit?" - include this in appointment notes  2. "Do you have or have access to (through a family member/friend) a smartphone with video capability that we can use for your visit?" a. If yes - list this number in appt notes as "cell" (if different from BEST phone #) and list the appointment type as a VIDEO visit in appointment notes b. If no - list the appointment type as a PHONE visit in appointment notes  3. Confirm consent - "In the setting of the current Covid19 crisis, you are scheduled for a (phone or video) visit with your provider on (date) at (time).  Just as we do with many in-office visits, in order for you to participate in this visit, we must obtain consent.  If you'd like, I can send this to your mychart (if signed up) or email for you to review.  Otherwise, I can obtain your verbal consent now.  All virtual visits are billed to your insurance company just like a normal visit would be.  By agreeing to a virtual visit, we'd like you to understand that the technology does not allow for your provider to perform an examination, and thus may limit your provider's ability to fully assess your condition. If your provider identifies any concerns that need to be evaluated in person, we will make arrangements to do so.  Finally, though the technology is pretty good, we cannot assure that it will always work on either your or our end, and in the setting of a video visit, we may have to convert it to a phone-only visit.  In either situation, we cannot ensure that we have a secure connection.  Are you willing to proceed?" STAFF: Did the patient verbally acknowledge consent to telehealth visit? Document  YES/NO here: YES  4. Advise patient to be prepared - "Two hours prior to your appointment, go ahead and check your blood pressure, pulse, oxygen saturation, and your weight (if you have the equipment to check those) and write them all down. When your visit starts, your provider will ask you for this information. If you have an Apple Watch or Kardia device, please plan to have heart rate information ready on the day of your appointment. Please have a pen and paper handy nearby the day of the visit as well."  5. Give patient instructions for MyChart download to smartphone OR Doximity/Doxy.me as below if video visit (depending on what platform provider is using)  6. Inform patient they will receive a phone call 15 minutes prior to their appointment time (may be from unknown caller ID) so they should be prepared to answer    TELEPHONE CALL NOTE  Kimberly Peterson has been deemed a candidate for a follow-up tele-health visit to limit community exposure during the Covid-19 pandemic. I spoke with the patient via phone to ensure availability of phone/video source, confirm preferred email & phone number, and discuss instructions and expectations.  I reminded KATESHA PACKETT to be prepared with any vital sign and/or heart rhythm information that could potentially be obtained via home monitoring, at the time of her visit. I reminded JALILAH HAWKEN to expect a phone call prior to  her visit.  Alisa Graff, [place] 07/19/2019 11:52 AM   INSTRUCTIONS FOR DOWNLOADING THE MYCHART APP TO SMARTPHONE  - The patient must first make sure to have activated MyChart and know their login information - If Apple, go to [employer] and type in MyChart in the search bar and download the app. If Android, ask patient to go to [employer] and type in [place] in the search bar and download the app. The app is free but as with any other app downloads, their phone may require them to verify saved payment information or  Apple/Android password.  - The patient will need to then log into the app with their MyChart username and password, and select [facility] as their healthcare provider to link the account. When it is time for your visit, go to the MyChart app, find appointments, and click Begin Video Visit. Be sure to Select Allow for your device to access the Microphone and Camera for your visit. You will then be connected, and your provider will be with you shortly.  **If they have any issues connecting, or need assistance please contact MyChart service desk [phone])**  **If using a computer, in order to ensure the best quality for their visit they will need to use either of the following Internet Browsers: [employer], or Google Chrome**  IF USING DOXIMITY or DOXY.ME - The patient will receive a link just prior to their visit by text.     FULL LENGTH CONSENT FOR TELE-HEALTH VISIT   I hereby voluntarily request, consent and authorize [facility] and its employed or contracted physicians, physician assistants, nurse practitioners or other licensed health care professionals (the Practitioner), to provide me with telemedicine health care services (the "Services") as deemed necessary by the treating Practitioner. I acknowledge and consent to receive the Services by the Practitioner via telemedicine. I understand that the telemedicine visit will involve communicating with the Practitioner through live audiovisual communication technology and the disclosure of certain medical information by electronic transmission. I acknowledge that I have been given the opportunity to request an in-person assessment or other available alternative prior to the telemedicine visit and am voluntarily participating in the telemedicine visit.  I understand that I have the right to withhold or withdraw my consent to the use of telemedicine in the course of my care at any time, without affecting my right to future care or  treatment, and that the Practitioner or I may terminate the telemedicine visit at any time. I understand that I have the right to inspect all information obtained and/or recorded in the course of the telemedicine visit and may receive copies of available information for a reasonable fee.  I understand that some of the potential risks of receiving the Services via telemedicine include:  Marland Kitchen Delay or interruption in medical evaluation due to technological equipment failure or disruption; . Information transmitted may not be sufficient (e.g. poor resolution of images) to allow for appropriate medical decision making by the Practitioner; and/or  . In rare instances, security protocols could fail, causing a breach of personal health information.  Furthermore, I acknowledge that it is my responsibility to provide information about my medical history, conditions and care that is complete and accurate to the best of my ability. I acknowledge that Practitioner's advice, recommendations, and/or decision may be based on factors not within their control, such as incomplete or inaccurate data provided by me or distortions of diagnostic images or specimens that may result from electronic transmissions.  I understand that the practice of medicine is not an exact science and that Practitioner makes no warranties or guarantees regarding treatment outcomes. I acknowledge that I will receive a copy of this consent concurrently upon execution via email to the email address I last provided but may also request a printed copy by calling the office of [facility].    I understand that my insurance will be billed for this visit.   I have read or had this consent read to me. . I understand the contents of this consent, which adequately explains the benefits and risks of the Services being provided via telemedicine.  . I have been provided ample opportunity to ask questions regarding this consent and the Services and  have had my questions answered to my satisfaction. . I give my informed consent for the services to be provided through the use of telemedicine in my medical care  By participating in this telemedicine visit I agree to the above.

## 2019-07-20 ENCOUNTER — Ambulatory Visit: Payer: Medicare Other | Attending: Family | Admitting: Family

## 2019-07-20 ENCOUNTER — Encounter: Payer: Self-pay | Admitting: Family

## 2019-07-20 ENCOUNTER — Other Ambulatory Visit: Payer: Self-pay

## 2019-07-20 ENCOUNTER — Telehealth: Payer: Self-pay

## 2019-07-20 VITALS — BP 147/64 | HR 86 | Temp 96.0°F | Wt 175.1 lb

## 2019-07-20 DIAGNOSIS — Z950 Presence of cardiac pacemaker: Secondary | ICD-10-CM | POA: Diagnosis not present

## 2019-07-20 DIAGNOSIS — D509 Iron deficiency anemia, unspecified: Secondary | ICD-10-CM | POA: Diagnosis not present

## 2019-07-20 DIAGNOSIS — J449 Chronic obstructive pulmonary disease, unspecified: Secondary | ICD-10-CM | POA: Diagnosis not present

## 2019-07-20 DIAGNOSIS — I10 Essential (primary) hypertension: Secondary | ICD-10-CM

## 2019-07-20 DIAGNOSIS — E1122 Type 2 diabetes mellitus with diabetic chronic kidney disease: Secondary | ICD-10-CM | POA: Diagnosis not present

## 2019-07-20 DIAGNOSIS — K219 Gastro-esophageal reflux disease without esophagitis: Secondary | ICD-10-CM | POA: Diagnosis not present

## 2019-07-20 DIAGNOSIS — N1831 Chronic kidney disease, stage 3a: Secondary | ICD-10-CM | POA: Diagnosis not present

## 2019-07-20 DIAGNOSIS — I2699 Other pulmonary embolism without acute cor pulmonale: Secondary | ICD-10-CM | POA: Diagnosis not present

## 2019-07-20 DIAGNOSIS — Z9981 Dependence on supplemental oxygen: Secondary | ICD-10-CM | POA: Diagnosis not present

## 2019-07-20 DIAGNOSIS — E876 Hypokalemia: Secondary | ICD-10-CM | POA: Diagnosis not present

## 2019-07-20 DIAGNOSIS — I442 Atrioventricular block, complete: Secondary | ICD-10-CM | POA: Diagnosis not present

## 2019-07-20 DIAGNOSIS — I5033 Acute on chronic diastolic (congestive) heart failure: Secondary | ICD-10-CM | POA: Diagnosis not present

## 2019-07-20 DIAGNOSIS — I130 Hypertensive heart and chronic kidney disease with heart failure and stage 1 through stage 4 chronic kidney disease, or unspecified chronic kidney disease: Secondary | ICD-10-CM | POA: Diagnosis not present

## 2019-07-20 DIAGNOSIS — Z7901 Long term (current) use of anticoagulants: Secondary | ICD-10-CM | POA: Diagnosis not present

## 2019-07-20 DIAGNOSIS — I890 Lymphedema, not elsewhere classified: Secondary | ICD-10-CM

## 2019-07-20 DIAGNOSIS — I480 Paroxysmal atrial fibrillation: Secondary | ICD-10-CM | POA: Diagnosis not present

## 2019-07-20 DIAGNOSIS — I5032 Chronic diastolic (congestive) heart failure: Secondary | ICD-10-CM

## 2019-07-20 DIAGNOSIS — Z7982 Long term (current) use of aspirin: Secondary | ICD-10-CM | POA: Diagnosis not present

## 2019-07-20 DIAGNOSIS — Z952 Presence of prosthetic heart valve: Secondary | ICD-10-CM | POA: Diagnosis not present

## 2019-07-20 DIAGNOSIS — E782 Mixed hyperlipidemia: Secondary | ICD-10-CM | POA: Diagnosis not present

## 2019-07-20 DIAGNOSIS — J9621 Acute and chronic respiratory failure with hypoxia: Secondary | ICD-10-CM | POA: Diagnosis not present

## 2019-07-20 DIAGNOSIS — Z794 Long term (current) use of insulin: Secondary | ICD-10-CM | POA: Diagnosis not present

## 2019-07-20 NOTE — Patient Instructions (Signed)
Continue weighing daily and call for an overnight weight gain of > 2 pounds or a weekly weight gain of >5 pounds. 

## 2019-07-20 NOTE — Progress Notes (Signed)
Virtual Visit via Telephone Note   Evaluation Performed:  Follow-up visit  This visit type was conducted due to national recommendations for restrictions regarding the COVID-19 Pandemic (e.g. social distancing).  This format is felt to be most appropriate for this patient at this time.  All issues noted in this document were discussed and addressed.  No physical exam was performed (except for noted visual exam findings with Video Visits).  Please refer to the patient's chart (MyChart message for video visits and phone note for telephone visits) for the patient's consent to telehealth for [facility]  Date:  07/20/2019   ID:  Kimberly Peterson, DOB Jan 22, 1930, MRN [id number]  Patient Location:  [place] [address]   Provider location:   [facility] [place] [address], [place] [address]  PCP:  [name], MD  Cardiologist:  Serafina Royals, MD Electrophysiologist:  None   Chief Complaint:  fatigue  History of Present Illness:    Kimberly Peterson is a 86 y.o. female who presents via audio/video conferencing for a telehealth visit today.  Patient verified DOB and address.  The patient does not have symptoms concerning for COVID-19 infection (fever, chills, cough, or new SHORTNESS OF BREATH).   Patient reports minimal fatigue upon moderate exertion. She describes this as chronic in nature having been present for several years. She has associated back pain along with this. She denies any dizziness, swelling in her legs or abdomen, palpitations, chest pain, shortness of breath, cough, difficulty sleeping or weight gain.   Continues to wear her oxygen at 2 L around the clock and her CPAP at bedtime. She says that she feels "great" since being home from her recent admission.    Prior CV studies:   The following studies were reviewed today:  Echo report from 07/12/19 reviewed and showed an EF of 60-65% along with mild MR, moderate MS  and severely elevated PA pressure.   Past Medical History:  Diagnosis Date  . Anemia   . CHF (congestive heart failure) ([place])   . Chronic kidney disease   . COPD (chronic obstructive pulmonary disease) ([place])   . Diabetes mellitus without complication ([place])   . Hypertension    Past Surgical History:  Procedure Laterality Date  . aoric valve replacemet      st Jude  . CATARACT EXTRACTION, BILATERAL    . COLONOSCOPY WITH PROPOFOL N/A 07/17/2019   Procedure: COLONOSCOPY WITH PROPOFOL;  Surgeon: Lin Landsman, MD;  Location: [facility] ENDOSCOPY;  Service: Gastroenterology;  Laterality: N/A;  . ESOPHAGOGASTRODUODENOSCOPY N/A 07/17/2019   Procedure: ESOPHAGOGASTRODUODENOSCOPY (EGD);  Surgeon: Lin Landsman, MD;  Location: [facility] ENDOSCOPY;  Service: Gastroenterology;  Laterality: N/A;  . PACEMAKER PLACEMENT    . VALVE REPLACEMENT       Current Meds  Medication Sig  . aspirin 81 MG tablet Take 81 mg by mouth daily.  Marland Kitchen atorvastatin (LIPITOR) 10 MG tablet TAKE 1 TABLET BY MOUTH ONCE DAILY FOR CHOLESTEROL (Patient taking differently: Take 10 mg by mouth daily. )  . CARTIA XT 240 MG 24 hr capsule Take 1 capsule by mouth once daily (Patient taking differently: Take 240 mg by mouth daily. )  . carvedilol (COREG) 6.25 MG tablet Take 1 tablet (6.25 mg total) by mouth 2 (two) times daily with a meal.  . hydrALAZINE (APRESOLINE) 25 MG tablet Take 37.5 mg by mouth 3 (three) times daily.  . insulin aspart protamine- aspart (NOVOLOG  MIX 70/30) (70-30) 100 UNIT/ML injection Inject 0.1 mLs (10 Units total) into the skin 2 (two) times daily with a meal.  . pantoprazole (PROTONIX) 40 MG tablet Take one tab po qd for stomach (Patient taking differently: Take 40 mg by mouth daily. )  . torsemide ([name]) 20 MG tablet Take 2 tablets (40 mg total) by mouth daily.  Marland Kitchen warfarin (COUMADIN) 5 MG tablet Take 1-1.5 tablets (5-7.5 mg total) by mouth See admin instructions. Take 1 tablet (5mg ) by mouth every Monday,  Tuesday. Wednesday, Friday, Saturday and Sunday evening and take 1 tablets (7.5mg ) by mouth every Thursday evening     Allergies:   Patient has no known allergies.   Social History   Tobacco Use  . Smoking status: Never Smoker  . Smokeless tobacco: Never Used  Substance Use Topics  . Alcohol use: No  . Drug use: No     Family Hx: The patient's family history is not on file.  ROS:   Please see the history of present illness.     All other systems reviewed and are negative.   Labs/Other Tests and Data Reviewed:    Recent Labs: 04/27/2019: TSH 0.500 07/11/2019: ALT 12; B Natriuretic Peptide 735.0 07/12/2019: Magnesium 2.3 07/16/2019: BUN 18; Creatinine, Ser 1.10; Platelets 279; Potassium 3.5; Sodium 139 07/17/2019: Hemoglobin 8.6    Wt Readings from Last 3 Encounters:  07/17/19 178 lb 12.7 oz (81.1 kg)  06/22/19 184 lb (83.5 kg)  06/07/19 182 lb 12.8 oz (82.9 kg)     Exam:    Vital Signs: Self-reported BP was 147/64 and weight 175.2 pounds  Well nourished, well developed female in no  acute distress.   ASSESSMENT & PLAN:    1. Chronic heart failure with preserved ejection fraction- - NYHA class II - euvolemic today based on patient's description of symptoms - weighing daily and home weight has been stable; reminded to call for an overnight weight gain of >2 pounds or a weekly weight gain of >5 pounds - not adding salt to her food - palliative care saw patient 06/14/2019 - had telemedicine visit with cardiology [name]) 10/28/2018 - saw pulmonology Humphrey Rolls) 02/17/2019 - BNP 07/11/19 was 735.0 - participating in [facility] program - has [facility]  2: HTN- - self-reported BP was good today - saw PCP Stephanie Coup) 06/07/2019 - BMP from 07/16/19 reviewed and showed sodium 139, potassium 3.5, creatinine 1.10 and GFR 52  3: Lymphedema- - stage 2 - wearing TED hose daily with removal at bedtime - limited in her ability to exercise due to symptoms - elevating  legs in the bed - resolved per patient's report  COVID-19 Education: The signs and symptoms of COVID-19 were discussed with the patient and how to seek care for testing (follow up with PCP or arrange E-visit).  The importance of social distancing was discussed today.  Patient Risk:   After full review of this patients clinical status, I feel that they are at least moderate risk at this time.  Time:   Today, I have spent 9 minutes with the patient with telehealth technology discussing medications, weight and symptoms to report   Medication Adjustments/Labs and Tests Ordered: Current medicines are reviewed at length with the patient today.  Concerns regarding medicines are outlined above.   Tests Ordered: No orders of the defined types were placed in this encounter.  Medication Changes: No orders of the defined types were placed in this encounter.   Disposition:  Follow-up in 3  months or sooner for any questions/problems before then.   Signed, Alisa Graff, FNP  07/20/2019 10:05 AM    [facility]

## 2019-07-20 NOTE — Telephone Encounter (Signed)
Pt called that pt discharged 07/18/19 her sugar stay in 222,233 and 241 as per dr Humphrey Rolls that advised pt and daughter that take novolog 70/30 14 units in am and 14 at night at call us back tomorrow with no

## 2019-07-21 ENCOUNTER — Other Ambulatory Visit: Payer: Self-pay | Admitting: *Deleted

## 2019-07-21 ENCOUNTER — Telehealth: Payer: Self-pay

## 2019-07-21 DIAGNOSIS — E1122 Type 2 diabetes mellitus with diabetic chronic kidney disease: Secondary | ICD-10-CM | POA: Diagnosis not present

## 2019-07-21 DIAGNOSIS — I480 Paroxysmal atrial fibrillation: Secondary | ICD-10-CM | POA: Diagnosis not present

## 2019-07-21 DIAGNOSIS — N1831 Chronic kidney disease, stage 3a: Secondary | ICD-10-CM | POA: Diagnosis not present

## 2019-07-21 DIAGNOSIS — I5033 Acute on chronic diastolic (congestive) heart failure: Secondary | ICD-10-CM | POA: Diagnosis not present

## 2019-07-21 DIAGNOSIS — I130 Hypertensive heart and chronic kidney disease with heart failure and stage 1 through stage 4 chronic kidney disease, or unspecified chronic kidney disease: Secondary | ICD-10-CM | POA: Diagnosis not present

## 2019-07-21 DIAGNOSIS — J449 Chronic obstructive pulmonary disease, unspecified: Secondary | ICD-10-CM | POA: Diagnosis not present

## 2019-07-21 NOTE — Patient Outreach (Signed)
[facility] ([facility]) Care Management  07/21/2019  Kimberly Peterson 04/22/1930 [id number]   Noted that member discharged from hospital on Monday after another exacerbation of heart failure.  Call placed to member to follow up on discharge, state she is doing "much better."  Denies any shortness of breath at this time, state her weight has remained stable over the last week.  She and her daughter are in the process now of doing daily readings of blood pressure, heart rate, oxygen level, and weight.  Her blood sugar today was 199, improved from 200s yesterday.  Per note, her insulin was slightly increased after her daughter called MD to report elevated blood sugars.  She will call again today with more recent readings.    Member had virtual visit with heart failure clinic yesterday, denies having paramedicine contact her again since discharge.  She does continue to have [facility] for home health and PT.  She is also still involved with the home based palliative care program with [facility].  She will contact K. Lynch with paramedicine, this care manager will also contact, to request follow up visit to be scheduled.    During hospital visit, member report having a colonoscopy due to having blood in her stool, will have follow up with  Pulmonology on 1/19, and Medicare Well visit on 1/18. Denies any urgent concerns at this time. Will follow up with member within the next month.  [facility] CM Care Plan Problem One     Most Recent Value  Care Plan Problem One  Risk for readmission related to heart failure management as evidenced by recent hospitalization  Role Documenting the Problem One  Care Management Coordinator  Care Plan for Problem One  Active  [facility] Long Term Goal   Member will not be readmitted to hospital within the next 31 days  [facility] Long Term Goal Start Date  07/21/19  Interventions for Problem One Long Term Goal  Most recent discharge instructions reviewed with member and daughter.   Reviewed heart failure interventions with member,including diet, medication management, and daily weights.  [facility] CM Short Term Goal #1   Member will report compliance with paramedicine visits over the next 4 weeks  [facility] CM Short Term Goal #1 Start Date  07/21/19 [Date reset]  Interventions for Short Term Goal #1  Contacted EMT to request call for home visit.  Advised member to contact as well to schedule visit  [facility] CM Short Term Goal #2   Member will keep and attend virtual appointment with cardiology within the next 4 weks   [facility] CM Short Term Goal #2 Start Date  06/16/19  [facility] CM Short Term Goal #2 Met Date  07/21/19     Monica Lane, RN, MSN [facility] Care Management  Community Care Manager [phone]   

## 2019-07-21 NOTE — Telephone Encounter (Signed)
CONFIRMED 07-25-19 OV AS VIRTUAL.

## 2019-07-22 ENCOUNTER — Telehealth: Payer: Self-pay

## 2019-07-22 NOTE — Telephone Encounter (Signed)
Confirmed virtual appointment with patient. klh

## 2019-07-25 ENCOUNTER — Ambulatory Visit (INDEPENDENT_AMBULATORY_CARE_PROVIDER_SITE_OTHER): Payer: Medicare Other | Admitting: Nurse Practitioner

## 2019-07-25 ENCOUNTER — Encounter: Payer: Self-pay | Admitting: Nurse Practitioner

## 2019-07-25 VITALS — BP 171/70 | HR 79 | Temp 97.7°F | Wt 173.4 lb

## 2019-07-25 DIAGNOSIS — I4891 Unspecified atrial fibrillation: Secondary | ICD-10-CM

## 2019-07-25 DIAGNOSIS — IMO0002 Reserved for concepts with insufficient information to code with codable children: Secondary | ICD-10-CM

## 2019-07-25 DIAGNOSIS — E1129 Type 2 diabetes mellitus with other diabetic kidney complication: Secondary | ICD-10-CM

## 2019-07-25 DIAGNOSIS — I10 Essential (primary) hypertension: Secondary | ICD-10-CM | POA: Diagnosis not present

## 2019-07-25 DIAGNOSIS — Z7901 Long term (current) use of anticoagulants: Secondary | ICD-10-CM

## 2019-07-25 DIAGNOSIS — J449 Chronic obstructive pulmonary disease, unspecified: Secondary | ICD-10-CM | POA: Diagnosis not present

## 2019-07-25 DIAGNOSIS — Z0001 Encounter for general adult medical examination with abnormal findings: Secondary | ICD-10-CM

## 2019-07-25 DIAGNOSIS — I480 Paroxysmal atrial fibrillation: Secondary | ICD-10-CM | POA: Diagnosis not present

## 2019-07-25 DIAGNOSIS — E1122 Type 2 diabetes mellitus with diabetic chronic kidney disease: Secondary | ICD-10-CM | POA: Diagnosis not present

## 2019-07-25 DIAGNOSIS — N1831 Chronic kidney disease, stage 3a: Secondary | ICD-10-CM | POA: Diagnosis not present

## 2019-07-25 DIAGNOSIS — E1165 Type 2 diabetes mellitus with hyperglycemia: Secondary | ICD-10-CM

## 2019-07-25 DIAGNOSIS — I5033 Acute on chronic diastolic (congestive) heart failure: Secondary | ICD-10-CM | POA: Diagnosis not present

## 2019-07-25 DIAGNOSIS — I130 Hypertensive heart and chronic kidney disease with heart failure and stage 1 through stage 4 chronic kidney disease, or unspecified chronic kidney disease: Secondary | ICD-10-CM | POA: Diagnosis not present

## 2019-07-25 DIAGNOSIS — Z09 Encounter for follow-up examination after completed treatment for conditions other than malignant neoplasm: Secondary | ICD-10-CM

## 2019-07-25 MED ORDER — INSULIN ASPART PROT & ASPART (70-30 MIX) 100 UNIT/ML ~~LOC~~ SUSP: 17.0000 [IU] | Freq: Two times a day (BID) | SUBCUTANEOUS | 11 refills | Status: DC

## 2019-07-25 NOTE — Progress Notes (Signed)
[facility] [place], [place] [address]  Internal MEDICINE  Telephone Visit  Patient Name: Kimberly Peterson  [id number]  [id number]  Date of Service: 07/25/2019  I connected with the patient at 12:29pm  by telephone and verified the patients identity using two identifiers.   I discussed the limitations, risks, security and privacy concerns of performing an evaluation and management service by telephone and the availability of in person appointments. I also discussed with the patient that there may be a patient responsible charge related to the service.  The patient expressed understanding and agrees to proceed.    Chief Complaint  Patient presents with  . Telephone Assessment    MDINR 2.4  . Telephone Screen  . Diabetes    BLOOD SUGAR READINGS: 15TH: 199 AM, 160 PM, 16TH: 210 AM, 244 PM, 17TH: 229 AM, 269 PM, 18TH: 201 AM ....Marland KitchenMarland Kitchen14 UNITS IN AM AND 14 UNITS IN PM FOR INSULIN   . Atrial Fibrillation  . Hypertension    The patient has been contacted via telephone for health maintenance visit due to concerns for spread of novel coronavirus. The patient presents for health maintenance exam. She states that she is feeling well, overall. She denies problems or concerns today. Her daughter states that her blood sugars are elevated recently. Was in the hospital. Was started on novolog mix 70/30 10 units twice daily. Was increased to 14 units twice daily and today, blood sugars continue to run in the 200s. Blood pressure also elevated. Her Coreg was decreased to 3.125mg  twice daily. Since she was released, her bp has been running moderately elevated. The patient does not generally get a flu shot. She was scheduled for diabetic eye exam, however, she was hospitalized during this appointment and will have to reschedule this appointment.       Current Medication: Outpatient Encounter Medications as of 07/25/2019  Medication Sig  . aspirin 81 MG tablet Take 81 mg by mouth  daily.  Marland Kitchen atorvastatin (LIPITOR) 10 MG tablet TAKE 1 TABLET BY MOUTH ONCE DAILY FOR CHOLESTEROL (Patient taking differently: Take 10 mg by mouth daily. )  . CARTIA XT 240 MG 24 hr capsule Take 1 capsule by mouth once daily (Patient taking differently: Take 240 mg by mouth daily. )  . carvedilol (COREG) 6.25 MG tablet TAKE 1/2 TAB 2 TIMES DAILY WITH MEALS.  . hydrALAZINE (APRESOLINE) 25 MG tablet Take 37.5 mg by mouth 3 (three) times daily.  . insulin aspart protamine- aspart (NOVOLOG MIX 70/30) (70-30) 100 UNIT/ML injection Inject 0.17 mLs (17 Units total) into the skin 2 (two) times daily with a meal.  . pantoprazole (PROTONIX) 40 MG tablet Take one tab po qd for stomach (Patient taking differently: Take 40 mg by mouth daily. )  . torsemide ([name]) 20 MG tablet Take 2 tablets (40 mg total) by mouth daily.  Marland Kitchen warfarin (COUMADIN) 5 MG tablet Take 1-1.5 tablets (5-7.5 mg total) by mouth See admin instructions. Take 1 tablet (5mg ) by mouth every Monday, Tuesday. Wednesday, Friday, Saturday and Sunday evening and take 1 tablets (7.5mg ) by mouth every Thursday evening  . [DISCONTINUED] insulin aspart protamine- aspart (NOVOLOG MIX 70/30) (70-30) 100 UNIT/ML injection Inject 0.1 mLs (10 Units total) into the skin 2 (two) times daily with a meal.  . carvedilol (COREG) 6.25 MG tablet Take 1 tablet (6.25 mg total) by mouth 2 (two) times daily with a meal.   No facility-administered encounter medications on file as of 07/25/2019.  Surgical History: Past Surgical History:  Procedure Laterality Date  . aoric valve replacemet      st Jude  . CATARACT EXTRACTION, BILATERAL    . COLONOSCOPY WITH PROPOFOL N/A 07/17/2019   Procedure: COLONOSCOPY WITH PROPOFOL;  Surgeon: Lin Landsman, MD;  Location: [facility] ENDOSCOPY;  Service: Gastroenterology;  Laterality: N/A;  . ESOPHAGOGASTRODUODENOSCOPY N/A 07/17/2019   Procedure: ESOPHAGOGASTRODUODENOSCOPY (EGD);  Surgeon: Lin Landsman, MD;  Location: [facility]  ENDOSCOPY;  Service: Gastroenterology;  Laterality: N/A;  . PACEMAKER PLACEMENT    . VALVE REPLACEMENT      Medical History: Past Medical History:  Diagnosis Date  . Anemia   . CHF (congestive heart failure) ([place])   . Chronic kidney disease   . COPD (chronic obstructive pulmonary disease) ([place])   . Diabetes mellitus without complication ([place])   . Hypertension     Family History: History reviewed. No pertinent family history.  Social History   Socioeconomic History  . Marital status: Widowed    Spouse name: Not on file  . Number of children: Not on file  . Years of education: Not on file  . Highest education level: Not on file  Occupational History  . Not on file  Tobacco Use  . Smoking status: Never Smoker  . Smokeless tobacco: Never Used  Substance and Sexual Activity  . Alcohol use: No  . Drug use: No  . Sexual activity: Never  Other Topics Concern  . Not on file  Social History Narrative  . Not on file   Social Determinants of Health   Financial Resource Strain: Low Risk   . Difficulty of Paying Living Expenses: Not hard at all  Food Insecurity: No Food Insecurity  . Worried About Charity fundraiser in the Last Year: Never true  . Ran Out of Food in the Last Year: Never true  Transportation Needs: No Transportation Needs  . Lack of Transportation (Medical): No  . Lack of Transportation (Non-Medical): No  Physical Activity:   . Days of Exercise per Week: Not on file  . Minutes of Exercise per Session: Not on file  Stress:   . Feeling of Stress : Not on file  Social Connections:   . Frequency of Communication with Friends and Family: Not on file  . Frequency of Social Gatherings with Friends and Family: Not on file  . Attends Religious Services: Not on file  . Active Member of Clubs or Organizations: Not on file  . Attends Archivist Meetings: Not on file  . Marital Status: Not on file  Intimate Partner Violence:   . Fear of Current or  Ex-Partner: Not on file  . Emotionally Abused: Not on file  . Physically Abused: Not on file  . Sexually Abused: Not on file      Review of Systems  Constitutional: Positive for activity change and fatigue. Negative for chills and diaphoresis.  HENT: Negative for ear pain, postnasal drip and sinus pressure.   Eyes: Negative for photophobia, discharge, redness, itching and visual disturbance.  Respiratory: Negative for cough, shortness of breath and wheezing.   Cardiovascular: Negative for chest pain, palpitations and leg swelling.       Elevated blood pressure recently.   Gastrointestinal: Negative for abdominal pain, constipation, diarrhea, nausea and vomiting.  Endocrine: Negative for cold intolerance, heat intolerance, polydipsia and polyuria.       Blood sugars are consistently over 200. Is now on novolog 70/30 mix of 14 units twice daily.  Genitourinary: Negative for dysuria, flank pain and hematuria.  Musculoskeletal: Negative for arthralgias, back pain, gait problem and neck pain.  Skin: Negative for color change.  Allergic/Immunologic: Negative for environmental allergies and food allergies.  Neurological: Positive for weakness. Negative for dizziness and headaches.  Hematological: Does not bruise/bleed easily.  Psychiatric/Behavioral: Negative for agitation, behavioral problems (depression) and hallucinations.    Today's Vitals   07/25/19 1120  BP: (!) 171/70  Pulse: 79  Temp: 97.7 F (36.5 C)  SpO2: 94%  Weight: 173 lb 6.4 oz (78.7 kg)   Body mass index is 31.72 kg/m.  Observation/Objective:    The patient is alert and oriented. She is pleasant and answers all questions appropriately. Breathing is non-labored. She is in no acute distress at this time.   Assessment/Plan: 1. Encounter for general adult medical examination with abnormal findings Annual health maintenace exam today.   2. Type 2 diabetes, uncontrolled, with renal manifestation (HCC) Blood  sugars consistently over 200. Increase novolog 70/30 to 17 units twice daily. Continue to monitor closely. Adjust as indicated.  - insulin aspart protamine- aspart (NOVOLOG MIX 70/30) (70-30) 100 UNIT/ML injection; Inject 0.17 mLs (17 Units total) into the skin 2 (two) times daily with a meal.  Dispense: 10 mL; Refill: 11  3. Atrial fibrillation, unspecified type ([place]) Stable. On chronic warfarin therapy  4. Long-term (current) use of anticoagulants, INR goal 2.0-3.0 INr 2.4 today. Continue warfarin at current dosing.   5. Benign essential HTN Increase coreg back to 6.25mg  twice daily. Continue cartia as prescribed. Monitor closely.   6. Hospital discharge follow-up Patient hospitalized at the beginning of the month due to severe anemia and hypotension. Reviewed progress notes, labs, and procedure notes.   General Counseling: Corrinne verbalizes understanding of the findings of today's phone visit and agrees with plan of treatment. I have discussed any further diagnostic evaluation that may be needed or ordered today. We also reviewed her medications today. she has been encouraged to call the office with any questions or concerns that should arise related to todays visit.  Diabetes Counseling:  1. Addition of ACE inh/ ARB'S for nephroprotection. Microalbumin is updated  2. Diabetic foot care, prevention of complications. Podiatry consult 3. Exercise and lose weight.  4. Diabetic eye examination, Diabetic eye exam is updated  5. Monitor blood sugar closlely. nutrition counseling.  6. Sign and symptoms of hypoglycemia including shaking sweating,confusion and headaches.  This patient was seen by [place] with Dr [name] as a part of collaborative care agreement  Meds ordered this encounter  Medications  . insulin aspart protamine- aspart (NOVOLOG MIX 70/30) (70-30) 100 UNIT/ML injection    Sig: Inject 0.17 mLs (17 Units total) into the skin 2 (two) times daily  with a meal.    Dispense:  10 mL    Refill:  11    Updating dosage.    Order Specific Question:   Supervising Provider    Answer:   [name] [[id number]]    Time spent: 35 Minutes  Time spent with patient included reviewing progress notes, labs, imaging studies, and discussing plan for follow up.   Dr [name] Internal medicine

## 2019-07-26 ENCOUNTER — Encounter: Payer: Self-pay | Admitting: Internal Medicine

## 2019-07-26 ENCOUNTER — Ambulatory Visit (INDEPENDENT_AMBULATORY_CARE_PROVIDER_SITE_OTHER): Payer: Medicare Other | Admitting: Internal Medicine

## 2019-07-26 ENCOUNTER — Other Ambulatory Visit: Payer: Self-pay

## 2019-07-26 ENCOUNTER — Telehealth (HOSPITAL_COMMUNITY): Payer: Self-pay

## 2019-07-26 VITALS — BP 167/94 | Temp 97.3°F | Ht 62.0 in | Wt 182.0 lb

## 2019-07-26 DIAGNOSIS — D509 Iron deficiency anemia, unspecified: Secondary | ICD-10-CM

## 2019-07-26 DIAGNOSIS — Z9981 Dependence on supplemental oxygen: Secondary | ICD-10-CM

## 2019-07-26 DIAGNOSIS — J9611 Chronic respiratory failure with hypoxia: Secondary | ICD-10-CM

## 2019-07-26 DIAGNOSIS — G4733 Obstructive sleep apnea (adult) (pediatric): Secondary | ICD-10-CM

## 2019-07-26 DIAGNOSIS — Z9989 Dependence on other enabling machines and devices: Secondary | ICD-10-CM

## 2019-07-26 DIAGNOSIS — I5032 Chronic diastolic (congestive) heart failure: Secondary | ICD-10-CM

## 2019-07-26 NOTE — Telephone Encounter (Signed)
Attempted to contact pt regarding recent d/c from hosp, her phone went straight to a VM.   Marylouise Stacks, EMT-Paramedic  07/26/19

## 2019-07-26 NOTE — Progress Notes (Signed)
[facility] [place], [place] [address]  Internal MEDICINE  Telephone Visit  Patient Name: Kimberly Peterson  [id number]  [id number]  Date of Service: 07/26/2019  I connected with the patient at 1003 by telephone and verified the patients identity using two identifiers.   I discussed the limitations, risks, security and privacy concerns of performing an evaluation and management service by telephone and the availability of in person appointments. I also discussed with the patient that there may be a patient responsible charge related to the service.  The patient expressed understanding and agrees to proceed.    Chief Complaint  Patient presents with  . Telephone Assessment  . Telephone Screen  . Hospitalization Follow-up  . COPD    HPI  PT seen via video for pulmonary.  She reports she is feeling well. She was recently admitted to the hospital and treated for multiple issues including; IDA, acute on chronic respiratory failure and acute on chronic diastolic heart failure. She was discharged after a week with a palliative care consult and a GI appt for a pill study.  Her breathing has been doing well since she was discharged.  She continues to use her oxygen at 2 lpm continuously. Pt reports good compliance with CPAP therapy. Cleaning machine by hand, and changing filters and tubing as directed. Denies headaches, sinus issues, palpitations, or hemoptysis.           Current Medication: Outpatient Encounter Medications as of 07/26/2019  Medication Sig  . aspirin 81 MG tablet Take 81 mg by mouth daily.  Marland Kitchen atorvastatin (LIPITOR) 10 MG tablet TAKE 1 TABLET BY MOUTH ONCE DAILY FOR CHOLESTEROL (Patient taking differently: Take 10 mg by mouth daily. )  . CARTIA XT 240 MG 24 hr capsule Take 1 capsule by mouth once daily (Patient taking differently: Take 240 mg by mouth daily. )  . carvedilol (COREG) 6.25 MG tablet TAKE 1/2 TAB 2 TIMES DAILY WITH MEALS.  . hydrALAZINE  (APRESOLINE) 25 MG tablet Take 37.5 mg by mouth 3 (three) times daily.  . insulin aspart protamine- aspart (NOVOLOG MIX 70/30) (70-30) 100 UNIT/ML injection Inject 0.17 mLs (17 Units total) into the skin 2 (two) times daily with a meal.  . pantoprazole (PROTONIX) 40 MG tablet Take one tab po qd for stomach (Patient taking differently: Take 40 mg by mouth daily. )  . torsemide ([name]) 20 MG tablet Take 2 tablets (40 mg total) by mouth daily.  Marland Kitchen warfarin (COUMADIN) 5 MG tablet Take 1-1.5 tablets (5-7.5 mg total) by mouth See admin instructions. Take 1 tablet (5mg ) by mouth every Monday, Tuesday. Wednesday, Friday, Saturday and Sunday evening and take 1 tablets (7.5mg ) by mouth every Thursday evening  . carvedilol (COREG) 6.25 MG tablet Take 1 tablet (6.25 mg total) by mouth 2 (two) times daily with a meal.   No facility-administered encounter medications on file as of 07/26/2019.    Surgical History: Past Surgical History:  Procedure Laterality Date  . aoric valve replacemet      st Jude  . CATARACT EXTRACTION, BILATERAL    . COLONOSCOPY WITH PROPOFOL N/A 07/17/2019   Procedure: COLONOSCOPY WITH PROPOFOL;  Surgeon: Lin Landsman, MD;  Location: [facility] ENDOSCOPY;  Service: Gastroenterology;  Laterality: N/A;  . ESOPHAGOGASTRODUODENOSCOPY N/A 07/17/2019   Procedure: ESOPHAGOGASTRODUODENOSCOPY (EGD);  Surgeon: Lin Landsman, MD;  Location: [facility] ENDOSCOPY;  Service: Gastroenterology;  Laterality: N/A;  . PACEMAKER PLACEMENT    . VALVE REPLACEMENT  Medical History: Past Medical History:  Diagnosis Date  . Anemia   . CHF (congestive heart failure) ([place])   . Chronic kidney disease   . COPD (chronic obstructive pulmonary disease) ([place])   . Diabetes mellitus without complication ([place])   . Hypertension     Family History: History reviewed. No pertinent family history.  Social History   Socioeconomic History  . Marital status: Widowed    Spouse name: Not on file  . Number  of children: Not on file  . Years of education: Not on file  . Highest education level: Not on file  Occupational History  . Not on file  Tobacco Use  . Smoking status: Never Smoker  . Smokeless tobacco: Never Used  Substance and Sexual Activity  . Alcohol use: No  . Drug use: No  . Sexual activity: Never  Other Topics Concern  . Not on file  Social History Narrative  . Not on file   Social Determinants of Health   Financial Resource Strain: Low Risk   . Difficulty of Paying Living Expenses: Not hard at all  Food Insecurity: No Food Insecurity  . Worried About Charity fundraiser in the Last Year: Never true  . Ran Out of Food in the Last Year: Never true  Transportation Needs: No Transportation Needs  . Lack of Transportation (Medical): No  . Lack of Transportation (Non-Medical): No  Physical Activity:   . Days of Exercise per Week: Not on file  . Minutes of Exercise per Session: Not on file  Stress:   . Feeling of Stress : Not on file  Social Connections:   . Frequency of Communication with Friends and Family: Not on file  . Frequency of Social Gatherings with Friends and Family: Not on file  . Attends Religious Services: Not on file  . Active Member of Clubs or Organizations: Not on file  . Attends Archivist Meetings: Not on file  . Marital Status: Not on file  Intimate Partner Violence:   . Fear of Current or Ex-Partner: Not on file  . Emotionally Abused: Not on file  . Physically Abused: Not on file  . Sexually Abused: Not on file      Review of Systems  Constitutional: Negative for chills, fatigue and unexpected weight change.  HENT: Negative for congestion, rhinorrhea, sneezing and sore throat.   Eyes: Negative for photophobia, pain and redness.  Respiratory: Negative for cough, chest tightness and shortness of breath.   Cardiovascular: Negative for chest pain and palpitations.  Gastrointestinal: Negative for abdominal pain, constipation,  diarrhea, nausea and vomiting.  Endocrine: Negative.   Genitourinary: Negative for dysuria and frequency.  Musculoskeletal: Negative for arthralgias, back pain, joint swelling and neck pain.  Skin: Negative for rash.  Allergic/Immunologic: Negative.   Neurological: Negative for tremors and numbness.  Hematological: Negative for adenopathy. Does not bruise/bleed easily.  Psychiatric/Behavioral: Negative for behavioral problems and sleep disturbance. The patient is not nervous/anxious.     Vital Signs: BP (!) 167/94   Temp (!) 97.3 F (36.3 C)   Ht 5\' 2"  (1.575 m)   Wt 182 lb (82.6 kg)   SpO2 93%   BMI 33.29 kg/m    Observation/Objective:  Well appearing, NAD noted.    Assessment/Plan: 1. OSA on CPAP Continue to use cpap as directed.   2. Chronic respiratory failure with hypoxia (HCC) Stable, continue with oxygen and medications as directed.  Palliative care referral by hospital will be tomorrow in patients  home.   3. Dependence on supplemental oxygen Continue to use oxygen as directed.   4. CHF (congestive heart failure), NYHA class I, chronic, diastolic (HCC) Stable, most recent EF 60-65%.  General Counseling: Josiane verbalizes understanding of the findings of today's phone visit and agrees with plan of treatment. I have discussed any further diagnostic evaluation that may be needed or ordered today. We also reviewed her medications today. she has been encouraged to call the office with any questions or concerns that should arise related to todays visit.    No orders of the defined types were placed in this encounter.   No orders of the defined types were placed in this encounter.   Time spent: 20 Minutes  This patient was seen by Orson Gear AGNP-C in Collaboration with Dr. Devona Konig as a part of collaborative care agreement.   Orson Gear AGNP-C Pulmonary medicine

## 2019-07-27 ENCOUNTER — Other Ambulatory Visit: Payer: Self-pay

## 2019-07-27 ENCOUNTER — Ambulatory Visit (INDEPENDENT_AMBULATORY_CARE_PROVIDER_SITE_OTHER): Payer: Medicare Other

## 2019-07-27 DIAGNOSIS — I4891 Unspecified atrial fibrillation: Secondary | ICD-10-CM | POA: Diagnosis not present

## 2019-07-27 DIAGNOSIS — I5033 Acute on chronic diastolic (congestive) heart failure: Secondary | ICD-10-CM | POA: Diagnosis not present

## 2019-07-27 DIAGNOSIS — I480 Paroxysmal atrial fibrillation: Secondary | ICD-10-CM | POA: Diagnosis not present

## 2019-07-27 DIAGNOSIS — J449 Chronic obstructive pulmonary disease, unspecified: Secondary | ICD-10-CM | POA: Diagnosis not present

## 2019-07-27 DIAGNOSIS — N1831 Chronic kidney disease, stage 3a: Secondary | ICD-10-CM | POA: Diagnosis not present

## 2019-07-27 DIAGNOSIS — I130 Hypertensive heart and chronic kidney disease with heart failure and stage 1 through stage 4 chronic kidney disease, or unspecified chronic kidney disease: Secondary | ICD-10-CM | POA: Diagnosis not present

## 2019-07-27 DIAGNOSIS — E1122 Type 2 diabetes mellitus with diabetic chronic kidney disease: Secondary | ICD-10-CM | POA: Diagnosis not present

## 2019-07-27 NOTE — Progress Notes (Signed)
Pt INR  2.4  Continue same med

## 2019-07-28 ENCOUNTER — Telehealth: Payer: Self-pay

## 2019-07-28 NOTE — Telephone Encounter (Signed)
CONFIRMED 08-01-19 OV AS VIRTUAL °

## 2019-07-29 DIAGNOSIS — E1122 Type 2 diabetes mellitus with diabetic chronic kidney disease: Secondary | ICD-10-CM | POA: Diagnosis not present

## 2019-07-29 DIAGNOSIS — I5033 Acute on chronic diastolic (congestive) heart failure: Secondary | ICD-10-CM | POA: Diagnosis not present

## 2019-07-29 DIAGNOSIS — I130 Hypertensive heart and chronic kidney disease with heart failure and stage 1 through stage 4 chronic kidney disease, or unspecified chronic kidney disease: Secondary | ICD-10-CM | POA: Diagnosis not present

## 2019-07-29 DIAGNOSIS — N1831 Chronic kidney disease, stage 3a: Secondary | ICD-10-CM | POA: Diagnosis not present

## 2019-07-29 DIAGNOSIS — J449 Chronic obstructive pulmonary disease, unspecified: Secondary | ICD-10-CM | POA: Diagnosis not present

## 2019-07-29 DIAGNOSIS — I480 Paroxysmal atrial fibrillation: Secondary | ICD-10-CM | POA: Diagnosis not present

## 2019-08-01 ENCOUNTER — Ambulatory Visit (INDEPENDENT_AMBULATORY_CARE_PROVIDER_SITE_OTHER): Payer: Medicare Other | Admitting: Nurse Practitioner

## 2019-08-01 ENCOUNTER — Telehealth (HOSPITAL_COMMUNITY): Payer: Self-pay

## 2019-08-01 ENCOUNTER — Encounter: Payer: Self-pay | Admitting: Nurse Practitioner

## 2019-08-01 VITALS — BP 160/72 | HR 76 | Temp 97.1°F | Wt 176.2 lb

## 2019-08-01 DIAGNOSIS — Z7901 Long term (current) use of anticoagulants: Secondary | ICD-10-CM | POA: Diagnosis not present

## 2019-08-01 DIAGNOSIS — I10 Essential (primary) hypertension: Secondary | ICD-10-CM

## 2019-08-01 DIAGNOSIS — I4891 Unspecified atrial fibrillation: Secondary | ICD-10-CM | POA: Diagnosis not present

## 2019-08-01 DIAGNOSIS — E1165 Type 2 diabetes mellitus with hyperglycemia: Secondary | ICD-10-CM | POA: Diagnosis not present

## 2019-08-01 DIAGNOSIS — I4821 Permanent atrial fibrillation: Secondary | ICD-10-CM | POA: Diagnosis not present

## 2019-08-01 NOTE — Progress Notes (Signed)
[facility] [place], [place] [address]  Internal MEDICINE  Telephone Visit  Patient Name: Kimberly Peterson  [id number]  [id number]  Date of Service: 08/01/2019  I connected with the patient at 1:56pm by telephone and verified the patients identity using two identifiers.   I discussed the limitations, risks, security and privacy concerns of performing an evaluation and management service by telephone and the availability of in person appointments. I also discussed with the patient that there may be a patient responsible charge related to the service.  The patient expressed understanding and agrees to proceed.    Chief Complaint  Patient presents with  . Telephone Assessment    MDINR 3.5-CHECKED TODAY  . Telephone Screen  . Diabetes    BLOOD SUGAR: 224  . Hypertension    The patient has been contacted via telephone for follow up visit due to concerns for spread of novel coronavirus. The patient presents for follow up visit. I spoke with patient's daughter today. She reports to me the following blood pressures and blood sugars.  1/21 - 150/58  Glucose - 165 in AM and 169 in PM 1/22 - 154-63 glucose  - 155 am and 176 in PM 1/23 - 138/53 Glucose 172 in AM and 211 in PM 1/24 - 166/70  Glucose 202 in AM and 205 in PM  1/25 - 160/72  Glucose 224 in AM.  She is currently taking humalog 70/19mix at 17 units twice daily.       Current Medication: Outpatient Encounter Medications as of 08/01/2019  Medication Sig  . aspirin 81 MG tablet Take 81 mg by mouth daily.  Marland Kitchen atorvastatin (LIPITOR) 10 MG tablet TAKE 1 TABLET BY MOUTH ONCE DAILY FOR CHOLESTEROL (Patient taking differently: Take 10 mg by mouth daily. )  . CARTIA XT 240 MG 24 hr capsule Take 1 capsule by mouth once daily (Patient taking differently: Take 240 mg by mouth daily. )  . carvedilol (COREG) 6.25 MG tablet 6.25 mg. TAKE 1 TAB 2 TIMES DAILY WITH MEALS.  . hydrALAZINE (APRESOLINE) 25 MG tablet Take 37.5  mg by mouth 3 (three) times daily.  . insulin aspart protamine- aspart (NOVOLOG MIX 70/30) (70-30) 100 UNIT/ML injection Inject 0.17 mLs (17 Units total) into the skin 2 (two) times daily with a meal.  . pantoprazole (PROTONIX) 40 MG tablet Take one tab po qd for stomach (Patient taking differently: Take 40 mg by mouth daily. )  . torsemide ([name]) 20 MG tablet Take 2 tablets (40 mg total) by mouth daily.  Marland Kitchen warfarin (COUMADIN) 5 MG tablet Take 1-1.5 tablets (5-7.5 mg total) by mouth See admin instructions. Take 1 tablet (5mg ) by mouth every Monday, Tuesday. Wednesday, Friday, Saturday and Sunday evening and take 1 tablets (7.5mg ) by mouth every Thursday evening  . carvedilol (COREG) 6.25 MG tablet Take 1 tablet (6.25 mg total) by mouth 2 (two) times daily with a meal.   No facility-administered encounter medications on file as of 08/01/2019.    Surgical History: Past Surgical History:  Procedure Laterality Date  . aoric valve replacemet      st Jude  . CATARACT EXTRACTION, BILATERAL    . COLONOSCOPY WITH PROPOFOL N/A 07/17/2019   Procedure: COLONOSCOPY WITH PROPOFOL;  Surgeon: Lin Landsman, MD;  Location: [facility] ENDOSCOPY;  Service: Gastroenterology;  Laterality: N/A;  . ESOPHAGOGASTRODUODENOSCOPY N/A 07/17/2019   Procedure: ESOPHAGOGASTRODUODENOSCOPY (EGD);  Surgeon: Lin Landsman, MD;  Location: [facility] ENDOSCOPY;  Service: Gastroenterology;  Laterality: N/A;  .  PACEMAKER PLACEMENT    . VALVE REPLACEMENT      Medical History: Past Medical History:  Diagnosis Date  . Anemia   . CHF (congestive heart failure) ([place])   . Chronic kidney disease   . COPD (chronic obstructive pulmonary disease) ([place])   . Diabetes mellitus without complication ([place])   . Hypertension     Family History: History reviewed. No pertinent family history.  Social History   Socioeconomic History  . Marital status: Widowed    Spouse name: Not on file  . Number of children: Not on file  . Years  of education: Not on file  . Highest education level: Not on file  Occupational History  . Not on file  Tobacco Use  . Smoking status: Never Smoker  . Smokeless tobacco: Never Used  Substance and Sexual Activity  . Alcohol use: No  . Drug use: No  . Sexual activity: Never  Other Topics Concern  . Not on file  Social History Narrative  . Not on file   Social Determinants of Health   Financial Resource Strain: Low Risk   . Difficulty of Paying Living Expenses: Not hard at all  Food Insecurity: No Food Insecurity  . Worried About Charity fundraiser in the Last Year: Never true  . Ran Out of Food in the Last Year: Never true  Transportation Needs: No Transportation Needs  . Lack of Transportation (Medical): No  . Lack of Transportation (Non-Medical): No  Physical Activity:   . Days of Exercise per Week: Not on file  . Minutes of Exercise per Session: Not on file  Stress:   . Feeling of Stress : Not on file  Social Connections:   . Frequency of Communication with Friends and Family: Not on file  . Frequency of Social Gatherings with Friends and Family: Not on file  . Attends Religious Services: Not on file  . Active Member of Clubs or Organizations: Not on file  . Attends Archivist Meetings: Not on file  . Marital Status: Not on file  Intimate Partner Violence:   . Fear of Current or Ex-Partner: Not on file  . Emotionally Abused: Not on file  . Physically Abused: Not on file  . Sexually Abused: Not on file      Review of Systems  Constitutional: Positive for fatigue. Negative for activity change, chills and diaphoresis.  HENT: Negative for ear pain, postnasal drip and sinus pressure.   Eyes: Negative for photophobia, discharge, redness, itching and visual disturbance.  Respiratory: Negative for cough, shortness of breath and wheezing.   Cardiovascular: Negative for chest pain, palpitations and leg swelling.       Elevated blood pressure recently.    Gastrointestinal: Negative for abdominal pain, constipation, diarrhea, nausea and vomiting.  Endocrine: Negative for cold intolerance, heat intolerance, polydipsia and polyuria.       Some improvement of blood sugars initially ater increase in insulin. Gradually rising to over 200 twice daily.   Genitourinary: Negative for dysuria, flank pain and hematuria.  Musculoskeletal: Negative for arthralgias, back pain, gait problem and neck pain.  Skin: Negative for color change.  Allergic/Immunologic: Negative for environmental allergies and food allergies.  Neurological: Positive for weakness. Negative for dizziness and headaches.  Hematological: Does not bruise/bleed easily.  Psychiatric/Behavioral: Negative for agitation, behavioral problems (depression) and hallucinations.    Today's Vitals   08/01/19 1150  BP: (!) 160/72  Pulse: 76  Temp: (!) 97.1 F (36.2 C)  SpO2:  96%  Weight: 176 lb 3.2 oz (79.9 kg)   Body mass index is 32.23 kg/m.   Observation/Objective:  I spoke with patient's daughter on the phone. I was able to say hello and chat with the patient for a minute. I was able to hear her throughout the visit.  The patient is alert and oriented. She is pleasant and answers all questions appropriately. Breathing is non-labored. She is in no acute distress at this time.    Assessment/Plan:  1. Uncontrolled type 2 diabetes mellitus with hyperglycemia (HCC) Will increase 70/30 insulin mix for second dose of today's insulin and for 08/02/2019 to 18units. Increase 70/30 to 19 on 08/03/2019. May increase to 20 units twice daily on 08/05/2019 if blood sugars are still elevated. The patient's daughter wrote down instructions and voiced  Understanding.   2. Benign essential HTN Stable. No changes to BP medication today.   3. Atrial fibrillation, unspecified type ([place]) Chronic warfarin therapy.  General Counseling: Dallis verbalizes understanding of the findings of today's phone visit and  agrees with plan of treatment. I have discussed any further diagnostic evaluation that may be needed or ordered today. We also reviewed her medications today. she has been encouraged to call the office with any questions or concerns that should arise related to todays visit.   Diabetes Counseling:  1. Addition of ACE inh/ ARB'S for nephroprotection. Microalbumin is updated  2. Diabetic foot care, prevention of complications. Podiatry consult 3. Exercise and lose weight.  4. Diabetic eye examination, Diabetic eye exam is updated  5. Monitor blood sugar closlely. nutrition counseling.  6. Sign and symptoms of hypoglycemia including shaking sweating,confusion and headaches.  This patient was seen by Leretha Pol FNP Collaboration with Dr [name] as a part of collaborative care agreement  Time spent: 22 Minutes    Dr [name] Internal medicine

## 2019-08-01 NOTE — Telephone Encounter (Signed)
Attempted to reach pt again regarding f/u post hosp d/c. The phone line is not ringing it just goes straight to a VM, I left a message asking her to return my phone call.   Marylouise Stacks, EMT-Paramedic  08/01/19

## 2019-08-02 ENCOUNTER — Ambulatory Visit: Payer: Medicare Other | Admitting: Gastroenterology

## 2019-08-02 ENCOUNTER — Telehealth: Payer: Self-pay | Admitting: Gastroenterology

## 2019-08-02 ENCOUNTER — Ambulatory Visit (INDEPENDENT_AMBULATORY_CARE_PROVIDER_SITE_OTHER): Payer: Medicare Other

## 2019-08-02 DIAGNOSIS — N1831 Chronic kidney disease, stage 3a: Secondary | ICD-10-CM | POA: Diagnosis not present

## 2019-08-02 DIAGNOSIS — J449 Chronic obstructive pulmonary disease, unspecified: Secondary | ICD-10-CM | POA: Diagnosis not present

## 2019-08-02 DIAGNOSIS — Z7901 Long term (current) use of anticoagulants: Secondary | ICD-10-CM | POA: Diagnosis not present

## 2019-08-02 DIAGNOSIS — I130 Hypertensive heart and chronic kidney disease with heart failure and stage 1 through stage 4 chronic kidney disease, or unspecified chronic kidney disease: Secondary | ICD-10-CM | POA: Diagnosis not present

## 2019-08-02 DIAGNOSIS — I480 Paroxysmal atrial fibrillation: Secondary | ICD-10-CM | POA: Diagnosis not present

## 2019-08-02 DIAGNOSIS — I5033 Acute on chronic diastolic (congestive) heart failure: Secondary | ICD-10-CM | POA: Diagnosis not present

## 2019-08-02 DIAGNOSIS — E1122 Type 2 diabetes mellitus with diabetic chronic kidney disease: Secondary | ICD-10-CM | POA: Diagnosis not present

## 2019-08-02 NOTE — Telephone Encounter (Signed)
Patient did not show for her appointment today with Dr Marius Ditch and I called spoke with Kimberly Peterson patient's daughter. She states the patient does not want to r/s her office appointment & to cancel the procedure for 08-16-2019.

## 2019-08-02 NOTE — Progress Notes (Signed)
PT INR WAS 3.5 ON 08/01/2019. PER HEATHER CALLED PT DAUGHTER AND ADVISED HER THAT PT SHOULD TAKE 1/2 DOSE TONIGHT, THEN RESUME NORMAL DOSING TOMORROW.

## 2019-08-03 DIAGNOSIS — I5033 Acute on chronic diastolic (congestive) heart failure: Secondary | ICD-10-CM | POA: Diagnosis not present

## 2019-08-03 DIAGNOSIS — N1831 Chronic kidney disease, stage 3a: Secondary | ICD-10-CM | POA: Diagnosis not present

## 2019-08-03 DIAGNOSIS — I130 Hypertensive heart and chronic kidney disease with heart failure and stage 1 through stage 4 chronic kidney disease, or unspecified chronic kidney disease: Secondary | ICD-10-CM | POA: Diagnosis not present

## 2019-08-03 DIAGNOSIS — E1122 Type 2 diabetes mellitus with diabetic chronic kidney disease: Secondary | ICD-10-CM | POA: Diagnosis not present

## 2019-08-03 DIAGNOSIS — I480 Paroxysmal atrial fibrillation: Secondary | ICD-10-CM | POA: Diagnosis not present

## 2019-08-03 DIAGNOSIS — J449 Chronic obstructive pulmonary disease, unspecified: Secondary | ICD-10-CM | POA: Diagnosis not present

## 2019-08-03 NOTE — Telephone Encounter (Signed)
Procedure has been cancelled, pt has been notified

## 2019-08-04 DIAGNOSIS — N1831 Chronic kidney disease, stage 3a: Secondary | ICD-10-CM | POA: Diagnosis not present

## 2019-08-04 DIAGNOSIS — I130 Hypertensive heart and chronic kidney disease with heart failure and stage 1 through stage 4 chronic kidney disease, or unspecified chronic kidney disease: Secondary | ICD-10-CM | POA: Diagnosis not present

## 2019-08-04 DIAGNOSIS — J449 Chronic obstructive pulmonary disease, unspecified: Secondary | ICD-10-CM | POA: Diagnosis not present

## 2019-08-04 DIAGNOSIS — I480 Paroxysmal atrial fibrillation: Secondary | ICD-10-CM | POA: Diagnosis not present

## 2019-08-04 DIAGNOSIS — I5033 Acute on chronic diastolic (congestive) heart failure: Secondary | ICD-10-CM | POA: Diagnosis not present

## 2019-08-04 DIAGNOSIS — E1122 Type 2 diabetes mellitus with diabetic chronic kidney disease: Secondary | ICD-10-CM | POA: Diagnosis not present

## 2019-08-05 ENCOUNTER — Telehealth: Payer: Self-pay

## 2019-08-05 DIAGNOSIS — N1831 Chronic kidney disease, stage 3a: Secondary | ICD-10-CM | POA: Diagnosis not present

## 2019-08-05 DIAGNOSIS — I130 Hypertensive heart and chronic kidney disease with heart failure and stage 1 through stage 4 chronic kidney disease, or unspecified chronic kidney disease: Secondary | ICD-10-CM | POA: Diagnosis not present

## 2019-08-05 DIAGNOSIS — I480 Paroxysmal atrial fibrillation: Secondary | ICD-10-CM | POA: Diagnosis not present

## 2019-08-05 DIAGNOSIS — E1122 Type 2 diabetes mellitus with diabetic chronic kidney disease: Secondary | ICD-10-CM | POA: Diagnosis not present

## 2019-08-05 DIAGNOSIS — J449 Chronic obstructive pulmonary disease, unspecified: Secondary | ICD-10-CM | POA: Diagnosis not present

## 2019-08-05 DIAGNOSIS — I5033 Acute on chronic diastolic (congestive) heart failure: Secondary | ICD-10-CM | POA: Diagnosis not present

## 2019-08-05 NOTE — Telephone Encounter (Signed)
Lmom for patient to confirm 08-09-19 ov as virtual.

## 2019-08-08 ENCOUNTER — Ambulatory Visit (INDEPENDENT_AMBULATORY_CARE_PROVIDER_SITE_OTHER): Payer: Medicare Other

## 2019-08-08 ENCOUNTER — Ambulatory Visit: Payer: Medicare Other | Admitting: Nurse Practitioner

## 2019-08-08 DIAGNOSIS — Z7901 Long term (current) use of anticoagulants: Secondary | ICD-10-CM | POA: Diagnosis not present

## 2019-08-08 NOTE — Progress Notes (Signed)
Pt inr 3.3 continue same med as per [employer]

## 2019-08-09 ENCOUNTER — Encounter: Payer: Self-pay | Admitting: Nurse Practitioner

## 2019-08-09 ENCOUNTER — Ambulatory Visit (INDEPENDENT_AMBULATORY_CARE_PROVIDER_SITE_OTHER): Payer: Medicare Other | Admitting: Internal Medicine

## 2019-08-09 ENCOUNTER — Telehealth: Payer: Self-pay

## 2019-08-09 VITALS — BP 157/74 | HR 84 | Temp 96.9°F | Wt 182.2 lb

## 2019-08-09 DIAGNOSIS — E1121 Type 2 diabetes mellitus with diabetic nephropathy: Secondary | ICD-10-CM | POA: Diagnosis not present

## 2019-08-09 DIAGNOSIS — Z794 Long term (current) use of insulin: Secondary | ICD-10-CM | POA: Diagnosis not present

## 2019-08-09 DIAGNOSIS — E1122 Type 2 diabetes mellitus with diabetic chronic kidney disease: Secondary | ICD-10-CM | POA: Diagnosis not present

## 2019-08-09 DIAGNOSIS — G4733 Obstructive sleep apnea (adult) (pediatric): Secondary | ICD-10-CM | POA: Diagnosis not present

## 2019-08-09 DIAGNOSIS — I4891 Unspecified atrial fibrillation: Secondary | ICD-10-CM | POA: Diagnosis not present

## 2019-08-09 DIAGNOSIS — Z9989 Dependence on other enabling machines and devices: Secondary | ICD-10-CM

## 2019-08-09 DIAGNOSIS — J449 Chronic obstructive pulmonary disease, unspecified: Secondary | ICD-10-CM | POA: Diagnosis not present

## 2019-08-09 DIAGNOSIS — N1832 Chronic kidney disease, stage 3b: Secondary | ICD-10-CM

## 2019-08-09 DIAGNOSIS — I480 Paroxysmal atrial fibrillation: Secondary | ICD-10-CM | POA: Diagnosis not present

## 2019-08-09 DIAGNOSIS — I10 Essential (primary) hypertension: Secondary | ICD-10-CM

## 2019-08-09 DIAGNOSIS — I130 Hypertensive heart and chronic kidney disease with heart failure and stage 1 through stage 4 chronic kidney disease, or unspecified chronic kidney disease: Secondary | ICD-10-CM | POA: Diagnosis not present

## 2019-08-09 DIAGNOSIS — N1831 Chronic kidney disease, stage 3a: Secondary | ICD-10-CM | POA: Diagnosis not present

## 2019-08-09 DIAGNOSIS — I5033 Acute on chronic diastolic (congestive) heart failure: Secondary | ICD-10-CM | POA: Diagnosis not present

## 2019-08-09 NOTE — Progress Notes (Signed)
[facility] [place], [place] [address]  Internal MEDICINE  Office Visit Note  Patient Name: Kimberly Peterson  [id number]  [id number]  Date of Service: 08/09/2019  Chief Complaint  Patient presents with  . Telephone Assessment  . Telephone Screen  . Hypertension  . Diabetes    147 glucose    HPI  I connected with the patient at 1100 am  by telephone and verified the patients identity using two identifiers.   I discussed the limitations, risks, security and privacy concerns of performing an evaluation and management service by telephone and the availability of in person appointments. I also discussed with the patient that there may be a patient responsible charge related to the service.  The patient expressed understanding and agrees to proceed. Her daughter is in the room with her, pt is having variation in her pm glucose numbers.fasting blood sugras are normal, weight and blood pressure is stable as well, she is using her CPAP machine  Current Medication: Outpatient Encounter Medications as of 08/09/2019  Medication Sig  . aspirin 81 MG tablet Take 81 mg by mouth daily.  Marland Kitchen atorvastatin (LIPITOR) 10 MG tablet TAKE 1 TABLET BY MOUTH ONCE DAILY FOR CHOLESTEROL (Patient taking differently: Take 10 mg by mouth daily. )  . CARTIA XT 240 MG 24 hr capsule Take 1 capsule by mouth once daily (Patient taking differently: Take 240 mg by mouth daily. )  . carvedilol (COREG) 6.25 MG tablet 6.25 mg. TAKE 1 TAB 2 TIMES DAILY WITH MEALS.  . hydrALAZINE (APRESOLINE) 25 MG tablet Take 37.5 mg by mouth 3 (three) times daily.  . insulin aspart protamine- aspart (NOVOLOG MIX 70/30) (70-30) 100 UNIT/ML injection Inject 0.17 mLs (17 Units total) into the skin 2 (two) times daily with a meal.  . pantoprazole (PROTONIX) 40 MG tablet Take one tab po qd for stomach (Patient taking differently: Take 40 mg by mouth daily. )  . torsemide ([name]) 20 MG tablet Take 2 tablets (40 mg total) by  mouth daily.  Marland Kitchen warfarin (COUMADIN) 5 MG tablet Take 1-1.5 tablets (5-7.5 mg total) by mouth See admin instructions. Take 1 tablet (5mg ) by mouth every Monday, Tuesday. Wednesday, Friday, Saturday and Sunday evening and take 1 tablets (7.5mg ) by mouth every Thursday evening  . carvedilol (COREG) 6.25 MG tablet Take 1 tablet (6.25 mg total) by mouth 2 (two) times daily with a meal.   No facility-administered encounter medications on file as of 08/09/2019.    Surgical History: Past Surgical History:  Procedure Laterality Date  . aoric valve replacemet      st Jude  . CATARACT EXTRACTION, BILATERAL    . COLONOSCOPY WITH PROPOFOL N/A 07/17/2019   Procedure: COLONOSCOPY WITH PROPOFOL;  Surgeon: Lin Landsman, MD;  Location: [facility] ENDOSCOPY;  Service: Gastroenterology;  Laterality: N/A;  . ESOPHAGOGASTRODUODENOSCOPY N/A 07/17/2019   Procedure: ESOPHAGOGASTRODUODENOSCOPY (EGD);  Surgeon: Lin Landsman, MD;  Location: [facility] ENDOSCOPY;  Service: Gastroenterology;  Laterality: N/A;  . PACEMAKER PLACEMENT    . VALVE REPLACEMENT      Medical History: Past Medical History:  Diagnosis Date  . Anemia   . CHF (congestive heart failure) ([place])   . Chronic kidney disease   . COPD (chronic obstructive pulmonary disease) ([place])   . Diabetes mellitus without complication ([place])   . Hypertension     Family History: History reviewed. No pertinent family history.  Social History   Socioeconomic History  . Marital status: Widowed  Spouse name: Not on file  . Number of children: Not on file  . Years of education: Not on file  . Highest education level: Not on file  Occupational History  . Not on file  Tobacco Use  . Smoking status: Never Smoker  . Smokeless tobacco: Never Used  Substance and Sexual Activity  . Alcohol use: No  . Drug use: No  . Sexual activity: Never  Other Topics Concern  . Not on file  Social History Narrative  . Not on file   Social Determinants of Health    Financial Resource Strain: Low Risk   . Difficulty of Paying Living Expenses: Not hard at all  Food Insecurity: No Food Insecurity  . Worried About Charity fundraiser in the Last Year: Never true  . Ran Out of Food in the Last Year: Never true  Transportation Needs: No Transportation Needs  . Lack of Transportation (Medical): No  . Lack of Transportation (Non-Medical): No  Physical Activity:   . Days of Exercise per Week: Not on file  . Minutes of Exercise per Session: Not on file  Stress:   . Feeling of Stress : Not on file  Social Connections:   . Frequency of Communication with Friends and Family: Not on file  . Frequency of Social Gatherings with Friends and Family: Not on file  . Attends Religious Services: Not on file  . Active Member of Clubs or Organizations: Not on file  . Attends Archivist Meetings: Not on file  . Marital Status: Not on file  Intimate Partner Violence:   . Fear of Current or Ex-Partner: Not on file  . Emotionally Abused: Not on file  . Physically Abused: Not on file  . Sexually Abused: Not on file   Review of Systems  Constitutional: Negative for chills, diaphoresis and fatigue.  HENT: Negative for ear pain, postnasal drip and sinus pressure.   Eyes: Negative for photophobia, discharge, redness, itching and visual disturbance.  Respiratory: Negative for cough, shortness of breath and wheezing.   Cardiovascular: Negative for chest pain, palpitations and leg swelling.  Gastrointestinal: Negative for abdominal pain, constipation, diarrhea, nausea and vomiting.  Genitourinary: Negative for dysuria and flank pain.  Musculoskeletal: Negative for arthralgias, back pain, gait problem and neck pain.  Skin: Negative for color change.  Allergic/Immunologic: Negative for environmental allergies and food allergies.  Neurological: Negative for dizziness and headaches.  Hematological: Does not bruise/bleed easily.  Psychiatric/Behavioral: Negative  for agitation, behavioral problems (depression) and hallucinations.   Vital Signs: BP (!) 157/74   Pulse 84   Temp (!) 96.9 F (36.1 C)   Wt 182 lb 3.2 oz (82.6 kg)   SpO2 92% Comment: 2 litre  BMI 33.32 kg/m    Physical Exam No exam is performed due to virtual visit   Assessment/Plan: 1. Type 2 diabetes mellitus with stage 3b chronic kidney disease, with long-term current use of insulin (HCC) Will discontinue insulin 70/30 due to fluctuations in her numbers, start Basaglar 15-20 units at bed time, start Humalog 8-12 units with breakfast and dinner, will titrate as needed   2. Atrial fibrillation, unspecified type ([place]) Controlled, continue anticoagulation   3. OSA on CPAP encouraged compliance   4. Benign essential HTN Controlled   General Counseling: Marcelia verbalizes understanding of the findings of todays visit and agrees with plan of treatment. I have discussed any further diagnostic evaluation that may be needed or ordered today. We also reviewed her medications today. she  has been encouraged to call the office with any questions or concerns that should arise related to todays visit.    Total time spent: 25 Minutes Time spent includes review of chart, medications, test results, and follow up plan with the patient.    Dr [name] Internal medicine

## 2019-08-09 NOTE — Telephone Encounter (Signed)
Gave samples for basaglar and humalog with instruction as per dr Humphrey Rolls basaglar start 12 units at night at 9:00 pm and take 8 units  Before breakfast and take 12 units before supper and if she eats less then take 10 units

## 2019-08-11 DIAGNOSIS — J449 Chronic obstructive pulmonary disease, unspecified: Secondary | ICD-10-CM | POA: Diagnosis not present

## 2019-08-11 DIAGNOSIS — E1122 Type 2 diabetes mellitus with diabetic chronic kidney disease: Secondary | ICD-10-CM | POA: Diagnosis not present

## 2019-08-11 DIAGNOSIS — I130 Hypertensive heart and chronic kidney disease with heart failure and stage 1 through stage 4 chronic kidney disease, or unspecified chronic kidney disease: Secondary | ICD-10-CM | POA: Diagnosis not present

## 2019-08-11 DIAGNOSIS — I480 Paroxysmal atrial fibrillation: Secondary | ICD-10-CM | POA: Diagnosis not present

## 2019-08-11 DIAGNOSIS — I5033 Acute on chronic diastolic (congestive) heart failure: Secondary | ICD-10-CM | POA: Diagnosis not present

## 2019-08-11 DIAGNOSIS — N1831 Chronic kidney disease, stage 3a: Secondary | ICD-10-CM | POA: Diagnosis not present

## 2019-08-15 ENCOUNTER — Ambulatory Visit: Payer: Medicare Other | Admitting: Internal Medicine

## 2019-08-15 ENCOUNTER — Ambulatory Visit (INDEPENDENT_AMBULATORY_CARE_PROVIDER_SITE_OTHER): Payer: Medicare Other

## 2019-08-15 DIAGNOSIS — I4891 Unspecified atrial fibrillation: Secondary | ICD-10-CM | POA: Diagnosis not present

## 2019-08-15 NOTE — Progress Notes (Signed)
Pt inr 3.6 as per heather skip coumadin today and continue as prescribe

## 2019-08-16 ENCOUNTER — Ambulatory Visit: Admit: 2019-08-16 | Payer: Medicare Other | Admitting: Gastroenterology

## 2019-08-16 DIAGNOSIS — I130 Hypertensive heart and chronic kidney disease with heart failure and stage 1 through stage 4 chronic kidney disease, or unspecified chronic kidney disease: Secondary | ICD-10-CM | POA: Diagnosis not present

## 2019-08-16 DIAGNOSIS — N1831 Chronic kidney disease, stage 3a: Secondary | ICD-10-CM | POA: Diagnosis not present

## 2019-08-16 DIAGNOSIS — I480 Paroxysmal atrial fibrillation: Secondary | ICD-10-CM | POA: Diagnosis not present

## 2019-08-16 DIAGNOSIS — E1122 Type 2 diabetes mellitus with diabetic chronic kidney disease: Secondary | ICD-10-CM | POA: Diagnosis not present

## 2019-08-16 DIAGNOSIS — J449 Chronic obstructive pulmonary disease, unspecified: Secondary | ICD-10-CM | POA: Diagnosis not present

## 2019-08-16 DIAGNOSIS — I5033 Acute on chronic diastolic (congestive) heart failure: Secondary | ICD-10-CM | POA: Diagnosis not present

## 2019-08-16 SURGERY — IMAGING PROCEDURE, GI TRACT, INTRALUMINAL, VIA CAPSULE
Anesthesia: General

## 2019-08-17 ENCOUNTER — Telehealth: Payer: Self-pay

## 2019-08-17 DIAGNOSIS — I5033 Acute on chronic diastolic (congestive) heart failure: Secondary | ICD-10-CM | POA: Diagnosis not present

## 2019-08-17 DIAGNOSIS — N1831 Chronic kidney disease, stage 3a: Secondary | ICD-10-CM | POA: Diagnosis not present

## 2019-08-17 DIAGNOSIS — I480 Paroxysmal atrial fibrillation: Secondary | ICD-10-CM | POA: Diagnosis not present

## 2019-08-17 DIAGNOSIS — J449 Chronic obstructive pulmonary disease, unspecified: Secondary | ICD-10-CM | POA: Diagnosis not present

## 2019-08-17 DIAGNOSIS — I130 Hypertensive heart and chronic kidney disease with heart failure and stage 1 through stage 4 chronic kidney disease, or unspecified chronic kidney disease: Secondary | ICD-10-CM | POA: Diagnosis not present

## 2019-08-17 DIAGNOSIS — E1122 Type 2 diabetes mellitus with diabetic chronic kidney disease: Secondary | ICD-10-CM | POA: Diagnosis not present

## 2019-08-17 NOTE — Telephone Encounter (Signed)
Yes patient is back on novolog 70/30 20 units 2 times daily.

## 2019-08-17 NOTE — Telephone Encounter (Signed)
So she is going back to the 70/30 at 20 units twice daily? I'm just verifying.

## 2019-08-17 NOTE — Telephone Encounter (Signed)
Can you find dout how many units of basaglar she is taking and how many units of humalog she is taking prior to meals? Looks like she was given a range for each of these medicatoins.

## 2019-08-17 NOTE — Telephone Encounter (Signed)
PT DAUGHTER SAID THAT PT PREFERS DOING THE NOVOLOG MIX BECAUSE PT DOES NOT LIKE HAVING TO TAKE INSULIN THREE TIMES AND PT DAUGHTER IS CONCERNED WITH THE COST ONCE SAMPLES ARE FINISHED. PER DR. KHAN I GAVE PT THE OPTION OF CONTINUING HUMALOG AND BASAGLAR BECAUSE WE ARE ABLE TO CONTROL IT BETTER AND PEN NEEDLES USUALLY DO NOT COST AS MUCH OR IF PT WANTS TO GO BACK ON NOVOLOG MIX THEN WE CAN DO THAT AS WELL. PT DAUGHTER STATED THAT PT WOULD RATHER DO NOVOLOG MIX AND PER HEATHER'S LAST NOTE ADVISED HER TO INJECT 20 UNITS 2 TIMES DAILY AND KEEP A LOG OF BLOOD SUGAR READINGS AS BEFORE. ADVISED PT DAUGHTER TO CALL us WITH READINGS.

## 2019-08-18 ENCOUNTER — Other Ambulatory Visit: Payer: Self-pay | Admitting: *Deleted

## 2019-08-18 ENCOUNTER — Telehealth: Payer: Self-pay

## 2019-08-18 NOTE — Telephone Encounter (Signed)
PT REQUESTING A VIRTUAL VISIT DID ADVISE WE WOULD LIKE TO SEE HER BACK IN OFFICE SOON. CONFIRMED 08-23-19 OV AS TELEHEALTH.

## 2019-08-18 NOTE — Patient Outreach (Signed)
[name] [facility]) Care Management  08/18/2019  Kimberly Peterson 1930-03-06 [id number]   Call placed to member to follow up on management of heart failure and other chronic medical conditions.  She report she is "doing alright, taking it one day at a time."  State she has good days and bad days, today is a good day.  Weights and blood pressure has been up and down, blood pressure reportedly mainly up prior to taking medications.  Denies any shortness of breath or chest discomfort.  Daughter continues to provide management of her care, medication management, and food support (low sodium).  She has been in contact with PCP office regarding elevated blood sugars since changing her insulin.  Member report she will restart her Novolog 70/30 today, hoping to decrease her blood sugars again to the 100's.  She continue to have support of palliative care and home health, has not had visit with paramedicine recently.  Advised that EMT was trying to contact her but was not successful, member report having trouble with her phone at times.  Will advised EMT to call back.    Member denies any urgent concerns, advised to contact this care manager with questions.  Will follow up within the next 6 weeks.  [facility] CM Care Plan Problem One     Most Recent Value  Care Plan Problem One  Risk for readmission related to heart failure management as evidenced by recent hospitalization  Role Documenting the Problem One  Care Management [place] for Problem One  Not Active  [facility] Long Term Goal   Member will not be readmitted to hospital within the next 31 days  [facility] Long Term Goal Start Date  07/21/19  [facility] Long Term Goal Met Date  08/18/19    [facility] CM Care Plan Problem Two     Most Recent Value  Care Plan Problem Two  Difficulty managing chronic medical conditions as evidenced by recurrent hospitalizations  Role Documenting the Problem Two  Care Management Coordinator  Care Plan for Problem Two  Active   Interventions for Problem Two Long Term Goal   Discussed plan of care with member, including continued activity with home health, palliative care, and paramedicine in effort to decrease risk of admission  [facility] Long Term Goal  Member will report no hospitalizations within the next 60 days  [facility] Long Term Goal Start Date  08/18/19  [facility] CM Short Term Goal #1   Member will report having follow up with EMT/paramedicine within the next 3 weeks  [facility] CM Short Term Goal #1 Start Date  08/18/19  Interventions for Short Term Goal #2   Collaborated with EMT regarding contact info to schedule visit.  [facility] CM Short Term Goal #2   Member will report blood sugars controlled/decreased over the next 3 weeks  [facility] CM Short Term Goal #2 Start Date  08/18/19  Interventions for Short Term Goal #2  Discussed with member the plan to restart Novolog 70/30.  She and daughter verbalize understanding of instructions     Kimberly David, RN, MSN [place] Manager [phone]

## 2019-08-19 DIAGNOSIS — I480 Paroxysmal atrial fibrillation: Secondary | ICD-10-CM | POA: Diagnosis not present

## 2019-08-19 DIAGNOSIS — Z950 Presence of cardiac pacemaker: Secondary | ICD-10-CM | POA: Diagnosis not present

## 2019-08-19 DIAGNOSIS — I5033 Acute on chronic diastolic (congestive) heart failure: Secondary | ICD-10-CM | POA: Diagnosis not present

## 2019-08-19 DIAGNOSIS — E876 Hypokalemia: Secondary | ICD-10-CM | POA: Diagnosis not present

## 2019-08-19 DIAGNOSIS — I130 Hypertensive heart and chronic kidney disease with heart failure and stage 1 through stage 4 chronic kidney disease, or unspecified chronic kidney disease: Secondary | ICD-10-CM | POA: Diagnosis not present

## 2019-08-19 DIAGNOSIS — Z9981 Dependence on supplemental oxygen: Secondary | ICD-10-CM | POA: Diagnosis not present

## 2019-08-19 DIAGNOSIS — E782 Mixed hyperlipidemia: Secondary | ICD-10-CM | POA: Diagnosis not present

## 2019-08-19 DIAGNOSIS — D509 Iron deficiency anemia, unspecified: Secondary | ICD-10-CM | POA: Diagnosis not present

## 2019-08-19 DIAGNOSIS — Z794 Long term (current) use of insulin: Secondary | ICD-10-CM | POA: Diagnosis not present

## 2019-08-19 DIAGNOSIS — K219 Gastro-esophageal reflux disease without esophagitis: Secondary | ICD-10-CM | POA: Diagnosis not present

## 2019-08-19 DIAGNOSIS — Z7901 Long term (current) use of anticoagulants: Secondary | ICD-10-CM | POA: Diagnosis not present

## 2019-08-19 DIAGNOSIS — Z7982 Long term (current) use of aspirin: Secondary | ICD-10-CM | POA: Diagnosis not present

## 2019-08-19 DIAGNOSIS — N1831 Chronic kidney disease, stage 3a: Secondary | ICD-10-CM | POA: Diagnosis not present

## 2019-08-19 DIAGNOSIS — J9621 Acute and chronic respiratory failure with hypoxia: Secondary | ICD-10-CM | POA: Diagnosis not present

## 2019-08-19 DIAGNOSIS — J449 Chronic obstructive pulmonary disease, unspecified: Secondary | ICD-10-CM | POA: Diagnosis not present

## 2019-08-19 DIAGNOSIS — I442 Atrioventricular block, complete: Secondary | ICD-10-CM | POA: Diagnosis not present

## 2019-08-19 DIAGNOSIS — Z952 Presence of prosthetic heart valve: Secondary | ICD-10-CM | POA: Diagnosis not present

## 2019-08-19 DIAGNOSIS — I2699 Other pulmonary embolism without acute cor pulmonale: Secondary | ICD-10-CM | POA: Diagnosis not present

## 2019-08-19 DIAGNOSIS — E1122 Type 2 diabetes mellitus with diabetic chronic kidney disease: Secondary | ICD-10-CM | POA: Diagnosis not present

## 2019-08-19 NOTE — Telephone Encounter (Signed)
Ok. I see her bank next week to reassess.

## 2019-08-22 ENCOUNTER — Ambulatory Visit (INDEPENDENT_AMBULATORY_CARE_PROVIDER_SITE_OTHER): Payer: Medicare Other

## 2019-08-22 ENCOUNTER — Other Ambulatory Visit: Payer: Self-pay

## 2019-08-22 DIAGNOSIS — I4891 Unspecified atrial fibrillation: Secondary | ICD-10-CM

## 2019-08-22 NOTE — Progress Notes (Signed)
Pt INR 3.2 as per adam pt daughter advised skip coumadin tonight and go back normal

## 2019-08-23 ENCOUNTER — Ambulatory Visit (INDEPENDENT_AMBULATORY_CARE_PROVIDER_SITE_OTHER): Payer: Medicare Other | Admitting: Nurse Practitioner

## 2019-08-23 ENCOUNTER — Encounter: Payer: Self-pay | Admitting: Nurse Practitioner

## 2019-08-23 VITALS — BP 130/85 | HR 98 | Temp 96.9°F | Ht 62.0 in | Wt 183.8 lb

## 2019-08-23 DIAGNOSIS — I10 Essential (primary) hypertension: Secondary | ICD-10-CM

## 2019-08-23 DIAGNOSIS — J449 Chronic obstructive pulmonary disease, unspecified: Secondary | ICD-10-CM | POA: Diagnosis not present

## 2019-08-23 DIAGNOSIS — N1832 Chronic kidney disease, stage 3b: Secondary | ICD-10-CM | POA: Diagnosis not present

## 2019-08-23 DIAGNOSIS — Z794 Long term (current) use of insulin: Secondary | ICD-10-CM

## 2019-08-23 DIAGNOSIS — E1121 Type 2 diabetes mellitus with diabetic nephropathy: Secondary | ICD-10-CM | POA: Diagnosis not present

## 2019-08-23 DIAGNOSIS — N1831 Chronic kidney disease, stage 3a: Secondary | ICD-10-CM | POA: Diagnosis not present

## 2019-08-23 DIAGNOSIS — I480 Paroxysmal atrial fibrillation: Secondary | ICD-10-CM | POA: Diagnosis not present

## 2019-08-23 DIAGNOSIS — I130 Hypertensive heart and chronic kidney disease with heart failure and stage 1 through stage 4 chronic kidney disease, or unspecified chronic kidney disease: Secondary | ICD-10-CM | POA: Diagnosis not present

## 2019-08-23 DIAGNOSIS — E1122 Type 2 diabetes mellitus with diabetic chronic kidney disease: Secondary | ICD-10-CM | POA: Diagnosis not present

## 2019-08-23 DIAGNOSIS — I5033 Acute on chronic diastolic (congestive) heart failure: Secondary | ICD-10-CM | POA: Diagnosis not present

## 2019-08-23 NOTE — Progress Notes (Signed)
[facility] [place], [place] [address]  Internal MEDICINE  Telephone Visit  Patient Name: Kimberly Peterson  [id number]  [id number]  Date of Service: 08/24/2019  I connected with the patient at 1:13pm by telephone and verified the patients identity using two identifiers.   I discussed the limitations, risks, security and privacy concerns of performing an evaluation and management service by telephone and the availability of in person appointments. I also discussed with the patient that there may be a patient responsible charge related to the service.  The patient expressed understanding and agrees to proceed.    Chief Complaint  Patient presents with  . Telephone Assessment  . Telephone Screen  . Diabetes    137 bs this am   . Hypertension  . Congestive Heart Failure    The patient has been contacted via telephone for follow up visit due to concerns for spread of novel coronavirus. The patient presents for follow up visit. She is currently taking 70/30 mix insulin. Taking this twice daily. She has had the following blood sugar redings: 2/12 - AM - 106,  PM - 180 2/13 - AM 151,  PM - 95 2/14 - AM - 97,  PM - 105 2/15 - AM - 120,  PM 234 2/16 - AM 137  She states that she is doing well on current dose of insulin. She has no new concerns or complaints today.       Current Medication: Outpatient Encounter Medications as of 08/23/2019  Medication Sig  . aspirin 81 MG tablet Take 81 mg by mouth daily.  Marland Kitchen atorvastatin (LIPITOR) 10 MG tablet TAKE 1 TABLET BY MOUTH ONCE DAILY FOR CHOLESTEROL (Patient taking differently: Take 10 mg by mouth daily. )  . CARTIA XT 240 MG 24 hr capsule Take 1 capsule by mouth once daily (Patient taking differently: Take 240 mg by mouth daily. )  . carvedilol (COREG) 6.25 MG tablet 6.25 mg. TAKE 1 TAB 2 TIMES DAILY WITH MEALS.  . hydrALAZINE (APRESOLINE) 25 MG tablet Take 37.5 mg by mouth 3 (three) times daily.  . insulin aspart  protamine- aspart (NOVOLOG MIX 70/30) (70-30) 100 UNIT/ML injection Inject 0.17 mLs (17 Units total) into the skin 2 (two) times daily with a meal.  . pantoprazole (PROTONIX) 40 MG tablet Take one tab po qd for stomach (Patient taking differently: Take 40 mg by mouth daily. )  . torsemide ([name]) 20 MG tablet Take 2 tablets (40 mg total) by mouth daily.  Marland Kitchen warfarin (COUMADIN) 5 MG tablet Take 1-1.5 tablets (5-7.5 mg total) by mouth See admin instructions. Take 1 tablet (5mg ) by mouth every Monday, Tuesday. Wednesday, Friday, Saturday and Sunday evening and take 1 tablets (7.5mg ) by mouth every Thursday evening  . carvedilol (COREG) 6.25 MG tablet Take 1 tablet (6.25 mg total) by mouth 2 (two) times daily with a meal.   No facility-administered encounter medications on file as of 08/23/2019.    Surgical History: Past Surgical History:  Procedure Laterality Date  . aoric valve replacemet      st Jude  . CATARACT EXTRACTION, BILATERAL    . COLONOSCOPY WITH PROPOFOL N/A 07/17/2019   Procedure: COLONOSCOPY WITH PROPOFOL;  Surgeon: Lin Landsman, MD;  Location: [facility] ENDOSCOPY;  Service: Gastroenterology;  Laterality: N/A;  . ESOPHAGOGASTRODUODENOSCOPY N/A 07/17/2019   Procedure: ESOPHAGOGASTRODUODENOSCOPY (EGD);  Surgeon: Lin Landsman, MD;  Location: [facility] ENDOSCOPY;  Service: Gastroenterology;  Laterality: N/A;  . PACEMAKER PLACEMENT    .  VALVE REPLACEMENT      Medical History: Past Medical History:  Diagnosis Date  . Anemia   . CHF (congestive heart failure) ([place])   . Chronic kidney disease   . COPD (chronic obstructive pulmonary disease) ([place])   . Diabetes mellitus without complication ([place])   . Hypertension     Family History: History reviewed. No pertinent family history.  Social History   Socioeconomic History  . Marital status: Widowed    Spouse name: Not on file  . Number of children: Not on file  . Years of education: Not on file  . Highest education level:  Not on file  Occupational History  . Not on file  Tobacco Use  . Smoking status: Never Smoker  . Smokeless tobacco: Never Used  Substance and Sexual Activity  . Alcohol use: No  . Drug use: No  . Sexual activity: Never  Other Topics Concern  . Not on file  Social History Narrative  . Not on file   Social Determinants of Health   Financial Resource Strain: Low Risk   . Difficulty of Paying Living Expenses: Not hard at all  Food Insecurity: No Food Insecurity  . Worried About Charity fundraiser in the Last Year: Never true  . Ran Out of Food in the Last Year: Never true  Transportation Needs: No Transportation Needs  . Lack of Transportation (Medical): No  . Lack of Transportation (Non-Medical): No  Physical Activity:   . Days of Exercise per Week: Not on file  . Minutes of Exercise per Session: Not on file  Stress:   . Feeling of Stress : Not on file  Social Connections:   . Frequency of Communication with Friends and Family: Not on file  . Frequency of Social Gatherings with Friends and Family: Not on file  . Attends Religious Services: Not on file  . Active Member of Clubs or Organizations: Not on file  . Attends Archivist Meetings: Not on file  . Marital Status: Not on file  Intimate Partner Violence:   . Fear of Current or Ex-Partner: Not on file  . Emotionally Abused: Not on file  . Physically Abused: Not on file  . Sexually Abused: Not on file      Review of Systems  Constitutional: Negative for activity change, chills, diaphoresis and fatigue.  HENT: Negative for ear pain, postnasal drip and sinus pressure.   Respiratory: Negative for cough, shortness of breath and wheezing.   Cardiovascular: Negative for chest pain and palpitations.  Gastrointestinal: Negative for abdominal pain, constipation, diarrhea, nausea and vomiting.  Endocrine: Negative for cold intolerance, heat intolerance, polydipsia and polyuria.  Musculoskeletal: Negative for  arthralgias, back pain, gait problem and neck pain.  Skin: Negative for color change.  Allergic/Immunologic: Negative for environmental allergies and food allergies.  Neurological: Positive for weakness. Negative for dizziness and headaches.  Hematological: Does not bruise/bleed easily.  Psychiatric/Behavioral: Negative for agitation, behavioral problems (depression) and hallucinations. The patient is not nervous/anxious.     Today's Vitals   08/23/19 1200  BP: 130/85  Pulse: 98  Temp: (!) 96.9 F (36.1 C)  SpO2: 96%  Weight: 183 lb 12.8 oz (83.4 kg)  Height: 5\' 2"  (1.575 m)   Body mass index is 33.62 kg/m.  Observation/Objective:   The patient is alert and oriented. She is pleasant and answers all questions appropriately. Breathing is non-labored. She is in no acute distress at this time.    Assessment/Plan: 1. Type 2  diabetes mellitus with stage 3b chronic kidney disease, with long-term current use of insulin (HCC) Blood sugars are stable. Continue 70/30 mix insulin at 20 units twice daily. continue to monitor blood sugars closely.  Follow up in two weeks and adjust dosing as indicated .  2. Benign essential HTN Stable. contiue bp medication as prescribed   General Counseling: Trystan verbalizes understanding of the findings of today's phone visit and agrees with plan of treatment. I have discussed any further diagnostic evaluation that may be needed or ordered today. We also reviewed her medications today. she has been encouraged to call the office with any questions or concerns that should arise related to todays visit.  Diabetes Counseling:  1. Addition of ACE inh/ ARB'S for nephroprotection. Microalbumin is updated  2. Diabetic foot care, prevention of complications. Podiatry consult 3. Exercise and lose weight.  4. Diabetic eye examination, Diabetic eye exam is updated  5. Monitor blood sugar closlely. nutrition counseling.  6. Sign and symptoms of hypoglycemia  including shaking sweating,confusion and headaches.  This patient was seen by Leretha Pol FNP Collaboration with Dr [name] as a part of collaborative care agreement   Time spent: 22 Minutes    Dr [name] Internal medicine

## 2019-08-24 DIAGNOSIS — N1831 Chronic kidney disease, stage 3a: Secondary | ICD-10-CM | POA: Diagnosis not present

## 2019-08-24 DIAGNOSIS — I5033 Acute on chronic diastolic (congestive) heart failure: Secondary | ICD-10-CM | POA: Diagnosis not present

## 2019-08-24 DIAGNOSIS — I130 Hypertensive heart and chronic kidney disease with heart failure and stage 1 through stage 4 chronic kidney disease, or unspecified chronic kidney disease: Secondary | ICD-10-CM | POA: Diagnosis not present

## 2019-08-24 DIAGNOSIS — J449 Chronic obstructive pulmonary disease, unspecified: Secondary | ICD-10-CM | POA: Diagnosis not present

## 2019-08-24 DIAGNOSIS — I480 Paroxysmal atrial fibrillation: Secondary | ICD-10-CM | POA: Diagnosis not present

## 2019-08-24 DIAGNOSIS — E1122 Type 2 diabetes mellitus with diabetic chronic kidney disease: Secondary | ICD-10-CM | POA: Diagnosis not present

## 2019-08-29 ENCOUNTER — Telehealth (HOSPITAL_COMMUNITY): Payer: Self-pay

## 2019-08-29 ENCOUNTER — Ambulatory Visit (INDEPENDENT_AMBULATORY_CARE_PROVIDER_SITE_OTHER): Payer: Medicare Other

## 2019-08-29 DIAGNOSIS — I4821 Permanent atrial fibrillation: Secondary | ICD-10-CM | POA: Diagnosis not present

## 2019-08-29 DIAGNOSIS — Z7901 Long term (current) use of anticoagulants: Secondary | ICD-10-CM

## 2019-08-29 NOTE — Telephone Encounter (Signed)
Contacted pt regarding home visits, spoke with pts daughter who is her caregiver. Daughter reports her phone has been acting up and doesn't work a lot of the time.  Pt does not need/want any further home visits. Daughter reports she is doing well, she checks her v/s daily and maintains her meds for pt without any issues. Daughter gets her to appointments and does all her care needs.  She is being seen by palliative care team and has PT come out weekly.  Advised daughter should anything change then to contact us back.  Pt is being d/c from paramedicine at this time.   Marylouise Stacks, EMT-Paramedic  08/29/19

## 2019-08-29 NOTE — Progress Notes (Signed)
Pt  INR 3.0 as per heather continue same med

## 2019-08-30 DIAGNOSIS — I5033 Acute on chronic diastolic (congestive) heart failure: Secondary | ICD-10-CM | POA: Diagnosis not present

## 2019-08-30 DIAGNOSIS — I130 Hypertensive heart and chronic kidney disease with heart failure and stage 1 through stage 4 chronic kidney disease, or unspecified chronic kidney disease: Secondary | ICD-10-CM | POA: Diagnosis not present

## 2019-08-30 DIAGNOSIS — J449 Chronic obstructive pulmonary disease, unspecified: Secondary | ICD-10-CM | POA: Diagnosis not present

## 2019-08-30 DIAGNOSIS — E1122 Type 2 diabetes mellitus with diabetic chronic kidney disease: Secondary | ICD-10-CM | POA: Diagnosis not present

## 2019-08-30 DIAGNOSIS — I480 Paroxysmal atrial fibrillation: Secondary | ICD-10-CM | POA: Diagnosis not present

## 2019-08-30 DIAGNOSIS — N1831 Chronic kidney disease, stage 3a: Secondary | ICD-10-CM | POA: Diagnosis not present

## 2019-08-31 DIAGNOSIS — E1122 Type 2 diabetes mellitus with diabetic chronic kidney disease: Secondary | ICD-10-CM | POA: Diagnosis not present

## 2019-08-31 DIAGNOSIS — I480 Paroxysmal atrial fibrillation: Secondary | ICD-10-CM | POA: Diagnosis not present

## 2019-08-31 DIAGNOSIS — J449 Chronic obstructive pulmonary disease, unspecified: Secondary | ICD-10-CM | POA: Diagnosis not present

## 2019-08-31 DIAGNOSIS — N1831 Chronic kidney disease, stage 3a: Secondary | ICD-10-CM | POA: Diagnosis not present

## 2019-08-31 DIAGNOSIS — I5033 Acute on chronic diastolic (congestive) heart failure: Secondary | ICD-10-CM | POA: Diagnosis not present

## 2019-08-31 DIAGNOSIS — I130 Hypertensive heart and chronic kidney disease with heart failure and stage 1 through stage 4 chronic kidney disease, or unspecified chronic kidney disease: Secondary | ICD-10-CM | POA: Diagnosis not present

## 2019-09-05 ENCOUNTER — Other Ambulatory Visit: Payer: Self-pay | Admitting: Internal Medicine

## 2019-09-05 ENCOUNTER — Ambulatory Visit (INDEPENDENT_AMBULATORY_CARE_PROVIDER_SITE_OTHER): Payer: Medicare Other

## 2019-09-05 DIAGNOSIS — Z7901 Long term (current) use of anticoagulants: Secondary | ICD-10-CM | POA: Diagnosis not present

## 2019-09-05 NOTE — Progress Notes (Signed)
Pt INR 3.0 as per heather continue same med as pes

## 2019-09-06 DIAGNOSIS — I5033 Acute on chronic diastolic (congestive) heart failure: Secondary | ICD-10-CM | POA: Diagnosis not present

## 2019-09-06 DIAGNOSIS — I480 Paroxysmal atrial fibrillation: Secondary | ICD-10-CM | POA: Diagnosis not present

## 2019-09-06 DIAGNOSIS — I130 Hypertensive heart and chronic kidney disease with heart failure and stage 1 through stage 4 chronic kidney disease, or unspecified chronic kidney disease: Secondary | ICD-10-CM | POA: Diagnosis not present

## 2019-09-06 DIAGNOSIS — E1122 Type 2 diabetes mellitus with diabetic chronic kidney disease: Secondary | ICD-10-CM | POA: Diagnosis not present

## 2019-09-06 DIAGNOSIS — N1831 Chronic kidney disease, stage 3a: Secondary | ICD-10-CM | POA: Diagnosis not present

## 2019-09-06 DIAGNOSIS — J449 Chronic obstructive pulmonary disease, unspecified: Secondary | ICD-10-CM | POA: Diagnosis not present

## 2019-09-07 ENCOUNTER — Telehealth: Payer: Self-pay

## 2019-09-07 DIAGNOSIS — I480 Paroxysmal atrial fibrillation: Secondary | ICD-10-CM | POA: Diagnosis not present

## 2019-09-07 DIAGNOSIS — I130 Hypertensive heart and chronic kidney disease with heart failure and stage 1 through stage 4 chronic kidney disease, or unspecified chronic kidney disease: Secondary | ICD-10-CM | POA: Diagnosis not present

## 2019-09-07 DIAGNOSIS — I5033 Acute on chronic diastolic (congestive) heart failure: Secondary | ICD-10-CM | POA: Diagnosis not present

## 2019-09-07 DIAGNOSIS — J449 Chronic obstructive pulmonary disease, unspecified: Secondary | ICD-10-CM | POA: Diagnosis not present

## 2019-09-07 DIAGNOSIS — N1831 Chronic kidney disease, stage 3a: Secondary | ICD-10-CM | POA: Diagnosis not present

## 2019-09-07 DIAGNOSIS — E1122 Type 2 diabetes mellitus with diabetic chronic kidney disease: Secondary | ICD-10-CM | POA: Diagnosis not present

## 2019-09-07 NOTE — Telephone Encounter (Signed)
CONFIRMED 09-09-19 OV AS VIRTUAL.

## 2019-09-08 ENCOUNTER — Telehealth: Payer: Self-pay

## 2019-09-08 NOTE — Telephone Encounter (Signed)
Pt daughter called stating that pt oxygen on 2 liters run 80-83% and the home health nurse yesterday changed the oxygen to 3 liters increasing her oxygen sat to 90% and today was 87%.  Pt daughter also states that pt takes 40 mg of torsemide and pt is retaining fluid in her legs along with weight gain. Today her weight went from 181.2lbs to 186.6lbs. Due to this daughter has pt elevating her legs. Pt daughter wanted to know what to do.  Pt daughter also gave me blood sugar readings and was concerned that blood sugars run better in the mornings but high in the evenings. Pt has not had an appetite for the past couple of days as well.  Her blood sugars read as following: 09/05/19:  147 am, 153 pm 09/06/19: 112 am, 248 pm 09/07/19: 203 am, 218 pm 09/08/19: 135 am  Pt daughter states pt only takes the novolog 70/30 mix, 20 units in the morning before meal and 20 units in the evening before meal.   Spoke with Dr. Clayborn Bigness and advised pt daughter for pt to take one extra tab of torsemide for fluid retention and swelling of her legs.  Also advised pt daughter per Dr. Humphrey Rolls we can put pt on slow acting insulin like humalog or novolog because adjusting her novolog mix will make her blood sugars go down too much. Pt daughter states that since her appetite has not been good pt daughter will continue to monitor her blood sugars for the next week and see how her blood sugars do.  Asked pt daughter if pt is using a cpap or bipap at night. When I called pt daughter back to ask if pt is using cpap or bipap at night, pt daughter checked oxygen sat and pt oxygen sat. was 96% with 3 liters of oxygen and stated pt used cpap at nighttime. Spoke to Dr. Humphrey Rolls again and advised pt daughter that pt can alternate between using an oxygen concentrator and using cpap machine during the day with oxygen connected to it.  Advised pt daughter that if she has any concerns to give [place] a call back.  I will call tomorrow to follow up on pt.

## 2019-09-09 ENCOUNTER — Encounter: Payer: Self-pay | Admitting: Nurse Practitioner

## 2019-09-09 ENCOUNTER — Ambulatory Visit (INDEPENDENT_AMBULATORY_CARE_PROVIDER_SITE_OTHER): Payer: Medicare Other | Admitting: Nurse Practitioner

## 2019-09-09 VITALS — BP 160/71 | HR 65 | Temp 96.6°F | Wt 183.6 lb

## 2019-09-09 DIAGNOSIS — R0600 Dyspnea, unspecified: Secondary | ICD-10-CM | POA: Diagnosis not present

## 2019-09-09 DIAGNOSIS — Z9981 Dependence on supplemental oxygen: Secondary | ICD-10-CM

## 2019-09-09 DIAGNOSIS — R0609 Other forms of dyspnea: Secondary | ICD-10-CM

## 2019-09-09 DIAGNOSIS — J449 Chronic obstructive pulmonary disease, unspecified: Secondary | ICD-10-CM

## 2019-09-09 DIAGNOSIS — E1121 Type 2 diabetes mellitus with diabetic nephropathy: Secondary | ICD-10-CM

## 2019-09-09 DIAGNOSIS — N1832 Chronic kidney disease, stage 3b: Secondary | ICD-10-CM

## 2019-09-09 DIAGNOSIS — I4891 Unspecified atrial fibrillation: Secondary | ICD-10-CM

## 2019-09-09 DIAGNOSIS — E1122 Type 2 diabetes mellitus with diabetic chronic kidney disease: Secondary | ICD-10-CM

## 2019-09-09 DIAGNOSIS — Z794 Long term (current) use of insulin: Secondary | ICD-10-CM | POA: Diagnosis not present

## 2019-09-09 DIAGNOSIS — I5033 Acute on chronic diastolic (congestive) heart failure: Secondary | ICD-10-CM

## 2019-09-09 DIAGNOSIS — R600 Localized edema: Secondary | ICD-10-CM

## 2019-09-09 NOTE — Progress Notes (Signed)
[facility], [place] [address]  Internal MEDICINE  Telephone Visit  Patient Name: Kimberly Peterson  [id number]  [id number]  Date of Service: 09/14/2019  I connected with the patient at 3:45pm by telephone and verified the patients identity using two identifiers.   I discussed the limitations, risks, security and privacy concerns of performing an evaluation and management service by telephone and the availability of in person appointments. I also discussed with the patient that there may be a patient responsible charge related to the service.  The patient expressed understanding and agrees to proceed.    Chief Complaint  Patient presents with  . Telephone Assessment  . Telephone Screen  . Diabetes    106 sugar reading  . Hypertension    The patient has been contacted via webcam for follow up visit due to concerns for spread of novel coronavirus. Blood sugars have been elevated. Blood sugars are as follows: 09/05/19:  147 am, 153 pm 09/06/19: 112 am, 248 pm 09/07/19: 203 am, 218 pm 09/08/19: 135 am, 373 pm 09/09/19 106 am.   No changes were made to insulin doses. Did discuss changes to long acting insulin, but patient no ready to make this change again. Today, AM sugar much better.  Blood pressure has also been elevated recently. She has increased swelling in her legs. Has gained one pound overnight. Did take extra torsemide dose yesterday afternoon. no improvement in symptoms today.  Patient not feeling well today. She is short of breath and has decreased energy. Oxygen levels are decreased. Today, still very short of breath, grunting to breathe. Appetite is decreased and stomach feels bloated and full. Has been using CPAP during the day with oxygen bled in. Helping some with oxygen saturations, but cumbersome as patient has to remove this every time she needs to get up to use the bathroom. Spoke with patient's daughter during this visit. The daughter states that  when these symptoms occur, the patient generally has to make a trip to the hospital, has to have IV diuretics and has to be placed on CPAP for a day or so. This treatment generally resolved acute symptoms.       Current Medication: Outpatient Encounter Medications as of 09/09/2019  Medication Sig  . aspirin 81 MG tablet Take 81 mg by mouth daily.  Marland Kitchen atorvastatin (LIPITOR) 10 MG tablet TAKE 1 TABLET BY MOUTH ONCE DAILY FOR CHOLESTEROL (Patient taking differently: Take 10 mg by mouth daily. )  . CARTIA XT 240 MG 24 hr capsule Take 1 capsule by mouth once daily (Patient taking differently: Take 240 mg by mouth daily. )  . carvedilol (COREG) 6.25 MG tablet 6.25 mg. TAKE 1 TAB 2 TIMES DAILY WITH MEALS.  . hydrALAZINE (APRESOLINE) 25 MG tablet TAKE 1 1/2 TABLETS BY MOUTH THREE TIMES DAILY FOR BLOOD PRESSURE  . insulin aspart protamine- aspart (NOVOLOG MIX 70/30) (70-30) 100 UNIT/ML injection Inject 0.17 mLs (17 Units total) into the skin 2 (two) times daily with a meal.  . pantoprazole (PROTONIX) 40 MG tablet Take one tab po qd for stomach (Patient taking differently: Take 40 mg by mouth daily. )  . torsemide ([name]) 20 MG tablet Take 2 tablets (40 mg total) by mouth daily.  Marland Kitchen warfarin (COUMADIN) 5 MG tablet Take 1-1.5 tablets (5-7.5 mg total) by mouth See admin instructions. Take 1 tablet (5mg ) by mouth every Monday, Tuesday. Wednesday, Friday, Saturday and Sunday evening and take 1 tablets (7.5mg ) by mouth every Thursday  evening  . carvedilol (COREG) 6.25 MG tablet Take 1 tablet (6.25 mg total) by mouth 2 (two) times daily with a meal.   No facility-administered encounter medications on file as of 09/09/2019.    Surgical History: Past Surgical History:  Procedure Laterality Date  . aoric valve replacemet      st Jude  . CATARACT EXTRACTION, BILATERAL    . COLONOSCOPY WITH PROPOFOL N/A 07/17/2019   Procedure: COLONOSCOPY WITH PROPOFOL;  Surgeon: Lin Landsman, MD;  Location: [facility]  ENDOSCOPY;  Service: Gastroenterology;  Laterality: N/A;  . ESOPHAGOGASTRODUODENOSCOPY N/A 07/17/2019   Procedure: ESOPHAGOGASTRODUODENOSCOPY (EGD);  Surgeon: Lin Landsman, MD;  Location: [facility] ENDOSCOPY;  Service: Gastroenterology;  Laterality: N/A;  . PACEMAKER PLACEMENT    . VALVE REPLACEMENT      Medical History: Past Medical History:  Diagnosis Date  . Anemia   . CHF (congestive heart failure) ([place])   . Chronic kidney disease   . COPD (chronic obstructive pulmonary disease) ([place])   . Diabetes mellitus without complication ([place])   . Hypertension     Family History: History reviewed. No pertinent family history.  Social History   Socioeconomic History  . Marital status: Widowed    Spouse name: Not on file  . Number of children: Not on file  . Years of education: Not on file  . Highest education level: Not on file  Occupational History  . Not on file  Tobacco Use  . Smoking status: Never Smoker  . Smokeless tobacco: Never Used  Substance and Sexual Activity  . Alcohol use: No  . Drug use: No  . Sexual activity: Never  Other Topics Concern  . Not on file  Social History Narrative  . Not on file   Social Determinants of Health   Financial Resource Strain: Low Risk   . Difficulty of Paying Living Expenses: Not hard at all  Food Insecurity: No Food Insecurity  . Worried About Charity fundraiser in the Last Year: Never true  . Ran Out of Food in the Last Year: Never true  Transportation Needs: No Transportation Needs  . Lack of Transportation (Medical): No  . Lack of Transportation (Non-Medical): No  Physical Activity:   . Days of Exercise per Week: Not on file  . Minutes of Exercise per Session: Not on file  Stress:   . Feeling of Stress : Not on file  Social Connections:   . Frequency of Communication with Friends and Family: Not on file  . Frequency of Social Gatherings with Friends and Family: Not on file  . Attends Religious Services: Not on file   . Active Member of Clubs or Organizations: Not on file  . Attends Archivist Meetings: Not on file  . Marital Status: Not on file  Intimate Partner Violence:   . Fear of Current or Ex-Partner: Not on file  . Emotionally Abused: Not on file  . Physically Abused: Not on file  . Sexually Abused: Not on file      Review of Systems  Constitutional: Positive for activity change and fatigue. Negative for chills and diaphoresis.       Patient less active right now due to increased shortness of breath.   HENT: Negative for ear pain, postnasal drip and sinus pressure.   Respiratory: Positive for shortness of breath and wheezing. Negative for cough.        Oxygen requirement higher at this time to keep saturations over 90%.   Cardiovascular: Positive for  leg swelling. Negative for chest pain and palpitations.       Elevated blood pressure.   Gastrointestinal: Positive for abdominal distention. Negative for abdominal pain, constipation, diarrhea, nausea and vomiting.  Endocrine: Negative for cold intolerance, heat intolerance, polydipsia and polyuria.       Blood sugars more elevated at night and doing well during the day.   Musculoskeletal: Positive for myalgias. Negative for arthralgias, back pain, gait problem and neck pain.  Skin: Negative for color change.  Allergic/Immunologic: Negative for environmental allergies and food allergies.  Neurological: Positive for weakness. Negative for dizziness and headaches.  Hematological: Does not bruise/bleed easily.  Psychiatric/Behavioral: Positive for dysphoric mood. Negative for agitation, behavioral problems (depression) and hallucinations. The patient is not nervous/anxious.     Today's Vitals   09/09/19 1523  BP: (!) 160/71  Pulse: 65  Temp: (!) 96.6 F (35.9 C)  SpO2: 93%  Weight: 183 lb 9.6 oz (83.3 kg)   Body mass index is 33.58 kg/m.  Observation/Objective:   The patient is alert and oriented. She is pleasant and  answers all questions appropriately. Breathing is non-labored. She is in no acute distress at this time. Patient is fatigued, having trouble staying awake during the visit. Also having trouble making through full sentences without pausing to take a deep breath.  Lower legs are swollen, bilaterally.   Assessment/Plan: 1. Dyspnea on exertion Patient with increased shortness of breath even at rest. Currently at higher oxygen dose per nasal cannula to keep o2 saturations above 90%.   2. Chronic obstructive pulmonary disease, unspecified COPD type ([place]) Continue to use inhalers and respiratory medications a prescribed. Did recommend that patient use CPAP therapy during the day along with supplemental oxygento help current exacerbation of symptoms.   3. Diastolic CHF, acute on chronic [facility]) Advised patient to take additional dose torsemide again today, as recommended yesterday per Dr. Humphrey Rolls. If no improvement, patient and her daughter agreed to go to ER for further evaluation and treatment.   4. Dependence on supplemental oxygen Continue oxygen via nasal cannula as prescribed   5. Type 2 diabetes mellitus with stage 3b chronic kidney disease, with long-term current use of insulin ([place]) For now, no changes in diabetic medication. Will continue to monitor closely.   6. Lower extremity edema Extra dose torsemide to be given today. Monitor closely.   7. Atrial fibrillation, unspecified type ([place]) Chronic warfarin therapy. Continue all medication as prescribed   General Counseling: Calee verbalizes understanding of the findings of today's phone visit and agrees with plan of treatment. I have discussed any further diagnostic evaluation that may be needed or ordered today. We also reviewed her medications today. she has been encouraged to call the office with any questions or concerns that should arise related to todays visit.  Cardiac risk factor modification:  1. Control blood pressure. 2. Exercise  as prescribed. 3. Follow low sodium, low fat diet. and low fat and low cholestrol diet. 4. Take ASA 81mg  once a day. 5. Restricted calories diet to lose weight.  This patient was seen by Leretha Pol FNP Collaboration with Dr [name] as a part of collaborative care agreement  Time spent: 25 Minutes    Dr [name] Internal medicine

## 2019-09-12 ENCOUNTER — Ambulatory Visit (INDEPENDENT_AMBULATORY_CARE_PROVIDER_SITE_OTHER): Payer: Medicare Other

## 2019-09-12 DIAGNOSIS — Z7901 Long term (current) use of anticoagulants: Secondary | ICD-10-CM | POA: Diagnosis not present

## 2019-09-12 LAB — POCT INR: INR: 5.9 — AB (ref 2.0–3.0)

## 2019-09-12 NOTE — Progress Notes (Signed)
Pt inr on 09/12/19 was 5.9. per heather called pt daughter and advised pt daughter to hold today and tomorrow's dose, then continue normal dosage. Pt will follow up next week.

## 2019-09-14 ENCOUNTER — Emergency Department: Payer: Medicare Other

## 2019-09-14 ENCOUNTER — Other Ambulatory Visit: Payer: Self-pay

## 2019-09-14 ENCOUNTER — Inpatient Hospital Stay
Admission: EM | Admit: 2019-09-14 | Discharge: 2019-09-22 | DRG: 291 | Disposition: A | Discharge status: Home Health | Payer: Medicare Other | Attending: Hospitalist | Admitting: Hospitalist

## 2019-09-14 DIAGNOSIS — Z9981 Dependence on supplemental oxygen: Secondary | ICD-10-CM

## 2019-09-14 DIAGNOSIS — I248 Other forms of acute ischemic heart disease: Secondary | ICD-10-CM | POA: Diagnosis not present

## 2019-09-14 DIAGNOSIS — K219 Gastro-esophageal reflux disease without esophagitis: Secondary | ICD-10-CM | POA: Diagnosis present

## 2019-09-14 DIAGNOSIS — R0602 Shortness of breath: Secondary | ICD-10-CM | POA: Insufficient documentation

## 2019-09-14 DIAGNOSIS — R069 Unspecified abnormalities of breathing: Secondary | ICD-10-CM | POA: Diagnosis not present

## 2019-09-14 DIAGNOSIS — R0902 Hypoxemia: Secondary | ICD-10-CM

## 2019-09-14 DIAGNOSIS — J449 Chronic obstructive pulmonary disease, unspecified: Secondary | ICD-10-CM | POA: Diagnosis not present

## 2019-09-14 DIAGNOSIS — I480 Paroxysmal atrial fibrillation: Secondary | ICD-10-CM | POA: Diagnosis not present

## 2019-09-14 DIAGNOSIS — E1122 Type 2 diabetes mellitus with diabetic chronic kidney disease: Secondary | ICD-10-CM | POA: Diagnosis present

## 2019-09-14 DIAGNOSIS — IMO0002 Reserved for concepts with insufficient information to code with codable children: Secondary | ICD-10-CM

## 2019-09-14 DIAGNOSIS — Z952 Presence of prosthetic heart valve: Secondary | ICD-10-CM

## 2019-09-14 DIAGNOSIS — Z794 Long term (current) use of insulin: Secondary | ICD-10-CM

## 2019-09-14 DIAGNOSIS — E1129 Type 2 diabetes mellitus with other diabetic kidney complication: Secondary | ICD-10-CM

## 2019-09-14 DIAGNOSIS — I130 Hypertensive heart and chronic kidney disease with heart failure and stage 1 through stage 4 chronic kidney disease, or unspecified chronic kidney disease: Secondary | ICD-10-CM | POA: Diagnosis not present

## 2019-09-14 DIAGNOSIS — I4891 Unspecified atrial fibrillation: Secondary | ICD-10-CM | POA: Diagnosis present

## 2019-09-14 DIAGNOSIS — E782 Mixed hyperlipidemia: Secondary | ICD-10-CM | POA: Diagnosis present

## 2019-09-14 DIAGNOSIS — I5023 Acute on chronic systolic (congestive) heart failure: Secondary | ICD-10-CM | POA: Diagnosis not present

## 2019-09-14 DIAGNOSIS — Z950 Presence of cardiac pacemaker: Secondary | ICD-10-CM

## 2019-09-14 DIAGNOSIS — I110 Hypertensive heart disease with heart failure: Secondary | ICD-10-CM | POA: Diagnosis not present

## 2019-09-14 DIAGNOSIS — I10 Essential (primary) hypertension: Secondary | ICD-10-CM | POA: Diagnosis not present

## 2019-09-14 DIAGNOSIS — Z7982 Long term (current) use of aspirin: Secondary | ICD-10-CM

## 2019-09-14 DIAGNOSIS — I509 Heart failure, unspecified: Secondary | ICD-10-CM | POA: Diagnosis not present

## 2019-09-14 DIAGNOSIS — Z79899 Other long term (current) drug therapy: Secondary | ICD-10-CM

## 2019-09-14 DIAGNOSIS — G4733 Obstructive sleep apnea (adult) (pediatric): Secondary | ICD-10-CM | POA: Diagnosis present

## 2019-09-14 DIAGNOSIS — J80 Acute respiratory distress syndrome: Secondary | ICD-10-CM | POA: Diagnosis not present

## 2019-09-14 DIAGNOSIS — I5033 Acute on chronic diastolic (congestive) heart failure: Secondary | ICD-10-CM | POA: Diagnosis not present

## 2019-09-14 DIAGNOSIS — Z20822 Contact with and (suspected) exposure to covid-19: Secondary | ICD-10-CM | POA: Diagnosis not present

## 2019-09-14 DIAGNOSIS — Z66 Do not resuscitate: Secondary | ICD-10-CM | POA: Diagnosis not present

## 2019-09-14 DIAGNOSIS — R609 Edema, unspecified: Secondary | ICD-10-CM

## 2019-09-14 DIAGNOSIS — R791 Abnormal coagulation profile: Secondary | ICD-10-CM | POA: Diagnosis present

## 2019-09-14 DIAGNOSIS — Z86711 Personal history of pulmonary embolism: Secondary | ICD-10-CM

## 2019-09-14 DIAGNOSIS — J9621 Acute and chronic respiratory failure with hypoxia: Secondary | ICD-10-CM | POA: Diagnosis not present

## 2019-09-14 DIAGNOSIS — Z7901 Long term (current) use of anticoagulants: Secondary | ICD-10-CM

## 2019-09-14 DIAGNOSIS — T45515A Adverse effect of anticoagulants, initial encounter: Secondary | ICD-10-CM | POA: Diagnosis present

## 2019-09-14 DIAGNOSIS — N1831 Chronic kidney disease, stage 3a: Secondary | ICD-10-CM | POA: Diagnosis not present

## 2019-09-14 DIAGNOSIS — N1832 Chronic kidney disease, stage 3b: Secondary | ICD-10-CM | POA: Diagnosis present

## 2019-09-14 DIAGNOSIS — E11649 Type 2 diabetes mellitus with hypoglycemia without coma: Secondary | ICD-10-CM | POA: Diagnosis not present

## 2019-09-14 LAB — COMPREHENSIVE METABOLIC PANEL
ALT: 28 U/L (ref 0–44)
AST: 65 U/L — ABNORMAL HIGH (ref 15–41)
Albumin: 3.5 g/dL (ref 3.5–5.0)
Alkaline Phosphatase: 100 U/L (ref 38–126)
Anion gap: 8 (ref 5–15)
BUN: 31 mg/dL — ABNORMAL HIGH (ref 8–23)
CO2: 30 mmol/L (ref 22–32)
Calcium: 8.5 mg/dL — ABNORMAL LOW (ref 8.9–10.3)
Chloride: 101 mmol/L (ref 98–111)
Creatinine, Ser: 1.52 mg/dL — ABNORMAL HIGH (ref 0.44–1.00)
GFR calc Af Amer: 35 mL/min — ABNORMAL LOW (ref >60)
GFR calc non Af Amer: 31 mL/min — ABNORMAL LOW (ref >60)
Glucose, Bld: 172 mg/dL — ABNORMAL HIGH (ref 70–99)
Potassium: 3.7 mmol/L (ref 3.5–5.1)
Sodium: 139 mmol/L (ref 135–145)
Total Bilirubin: 0.7 mg/dL (ref 0.3–1.2)
Total Protein: 7.6 g/dL (ref 6.5–8.1)

## 2019-09-14 LAB — CBC
HCT: 37 % (ref 36.0–46.0)
Hemoglobin: 10.7 g/dL — ABNORMAL LOW (ref 12.0–15.0)
MCH: 23.7 pg — ABNORMAL LOW (ref 26.0–34.0)
MCHC: 28.9 g/dL — ABNORMAL LOW (ref 30.0–36.0)
MCV: 81.9 fL (ref 80.0–100.0)
Platelets: 236 10*3/uL (ref 150–400)
RBC: 4.52 MIL/uL (ref 3.87–5.11)
RDW: 19.4 % — ABNORMAL HIGH (ref 11.5–15.5)
WBC: 6 10*3/uL (ref 4.0–10.5)
nRBC: 0 % (ref 0.0–0.2)

## 2019-09-14 LAB — BRAIN NATRIURETIC PEPTIDE: B Natriuretic Peptide: 603 pg/mL — ABNORMAL HIGH (ref 0.0–100.0)

## 2019-09-14 LAB — TROPONIN I (HIGH SENSITIVITY): Troponin I (High Sensitivity): 49 ng/L — ABNORMAL HIGH (ref <18)

## 2019-09-14 MED ORDER — FUROSEMIDE 10 MG/ML IJ SOLN
40.0000 mg | Freq: Once | INTRAMUSCULAR | Status: AC
  Administered 2019-09-14: 22:00:00 40 mg via INTRAVENOUS
  Filled 2019-09-14: qty 4

## 2019-09-14 NOTE — ED Triage Notes (Signed)
Pt to ED via [facility] from home for chief complaint of SHOB/respiratory distress. EMS reports pt has had SHOB x2 weeks, was advised to go to ED on Friday and refused. Pitting edema noted to BLE. EMS reports pt wears 2L [place] at home continuously, was unable to get SpO2 reading on pt, placed on NRB for reading of 100% upon arrival.  Alert and oriented x4.  20g L AC placed by EMS. Denies fever.   Daughter POA Noe Gens [phone]  Pt taken off NRB, placed on 4L [place], SpO2 99%. Dr Corky Downs aware

## 2019-09-14 NOTE — ED Provider Notes (Addendum)
[facility] Emergency Department Provider Note   ____________________________________________    I have reviewed the triage vital signs and the nursing notes.   HISTORY  Chief Complaint Shortness of Breath     HPI Kimberly Peterson is a 86 y.o. female with history of CHF on torsemide, CKD, COPD, diabetes, hypertension who presents with complaints of shortness of breath.  Patient reports she usually wears 2 L nasal cannula at home but feels that she has had to increase her oxygen recently.  She blames it on fluid accumulation.  She states her legs are more swollen than typical.  She denies chest pain.  Reports shortness of breath with exertion.  No fevers or chills or cough.  Review of record demonstrates admission in January of this year for acute on chronic diastolic CHF  Past Medical History:  Diagnosis Date  . Anemia   . CHF (congestive heart failure) ([place])   . Chronic kidney disease   . COPD (chronic obstructive pulmonary disease) ([place])   . Diabetes mellitus without complication ([place])   . Hypertension     Patient Active Problem List   Diagnosis Date Noted  . Dyspnea on exertion 09/14/2019  . Encounter for general adult medical examination with abnormal findings 07/25/2019  . Long-term (current) use of anticoagulants, INR goal 2.0-3.0 07/25/2019  . Hospital discharge follow-up 07/25/2019  . Iron deficiency anemia   . Type 2 diabetes, uncontrolled, with renal manifestation ([place]) 07/11/2019  . Microcytic anemia 07/11/2019  . Lactic acidosis 07/11/2019  . Hypokalemia 07/11/2019  . Elevated troponin 07/11/2019  . Acute renal failure superimposed on stage 3a chronic kidney disease ([place]) 07/11/2019  . Acute on chronic diastolic heart failure ([place]) 04/27/2019  . Acute and chronic respiratory failure with hypoxia ([place]) 03/01/2019  . Acute respiratory failure with hypoxia ([place]) 02/07/2019  . Type 2 diabetes with peripheral circulatory disorder,  controlled ([place]) 10/17/2018  . Complete heart block ([place]) 09/09/2018  . Severe aortic valve stenosis 09/09/2018  . Lymphedema 08/12/2018  . Uncontrolled type 2 diabetes mellitus with hyperglycemia ([place]) 02/25/2018  . Atrial fibrillation ([place]) 02/25/2018  . Encounter for current long-term use of anticoagulants 02/25/2018  . Dependence on supplemental oxygen 02/25/2018  . Lower extremity edema 02/25/2018  . CHF (congestive heart failure) ([place]) 12/04/2015  . Diabetes mellitus ([place]) 12/04/2015  . Chronic obstructive pulmonary disease ([place]) 09/20/2015  . Diastolic CHF, acute on chronic (HCC) 09/19/2015  . Benign essential HTN 09/19/2015  . Controlled diabetes mellitus without complication, with long-term current use of insulin ([place]) 09/19/2015  . Hyperlipidemia, mixed 09/04/2014  . Pulmonary embolism ([place]) 09/23/2013    Past Surgical History:  Procedure Laterality Date  . aoric valve replacemet      st Jude  . CATARACT EXTRACTION, BILATERAL    . COLONOSCOPY WITH PROPOFOL N/A 07/17/2019   Procedure: COLONOSCOPY WITH PROPOFOL;  Surgeon: Lin Landsman, MD;  Location[facility] ENDOSCOPY;  Service: Gastroenterology;  Laterality: N/A;  . ESOPHAGOGASTRODUODENOSCOPY N/A 07/17/2019   Procedure: ESOPHAGOGASTRODUODENOSCOPY (EGD);  Surgeon: Lin Landsman, MD;  Location[facility] ENDOSCOPY;  Service: Gastroenterology;  Laterality: N/A;  . PACEMAKER PLACEMENT    . VALVE REPLACEMENT      Prior to Admission medications   Medication Sig Start Date End Date Taking? Authorizing Provider  aspirin 81 MG tablet Take 81 mg by mouth daily.    [provider]  atorvastatin (LIPITOR) 10 MG tablet TAKE 1 TABLET BY MOUTH ONCE DAILY FOR CHOLESTEROL Patient taking differently: Take  10 mg by mouth daily.  12/06/18   Scarboro, Audie Clear, NP  CARTIA XT 240 MG 24 hr capsule Take 1 capsule by mouth once daily Patient taking differently: Take 240 mg by mouth daily.  04/06/19   [name], MD  carvedilol  (COREG) 6.25 MG tablet Take 1 tablet (6.25 mg total) by mouth 2 (two) times daily with a meal. 03/22/19 07/20/19  [name], MD  carvedilol (COREG) 6.25 MG tablet 6.25 mg. TAKE 1 TAB 2 TIMES DAILY WITH MEALS.    [provider]  hydrALAZINE (APRESOLINE) 25 MG tablet TAKE 1 1/2 TABLETS BY MOUTH THREE TIMES DAILY FOR BLOOD PRESSURE 09/05/19   Ronnell Freshwater, NP  insulin aspart protamine- aspart (NOVOLOG MIX 70/30) (70-30) 100 UNIT/ML injection Inject 0.17 mLs (17 Units total) into the skin 2 (two) times daily with a meal. 07/25/19   Boscia, Greer Ee, NP  pantoprazole (PROTONIX) 40 MG tablet Take one tab po qd for stomach Patient taking differently: Take 40 mg by mouth daily.  03/22/19   [name], MD  torsemide ([name]) 20 MG tablet Take 2 tablets (40 mg total) by mouth daily. 05/01/19   Hillary Bow, MD  warfarin (COUMADIN) 5 MG tablet Take 1-1.5 tablets (5-7.5 mg total) by mouth See admin instructions. Take 1 tablet (5mg ) by mouth every Monday, Tuesday. Wednesday, Friday, Saturday and Sunday evening and take 1 tablets (7.5mg ) by mouth every Thursday evening 07/18/19   Dhungel, Flonnie Overman, MD     Allergies Patient has no known allergies.  No family history on file.  Social History Social History   Tobacco Use  . Smoking status: Never Smoker  . Smokeless tobacco: Never Used  Substance Use Topics  . Alcohol use: No  . Drug use: No    Review of Systems  Constitutional: No fever/chills Eyes: No visual changes.  ENT: No sore throat. Cardiovascular: As above Respiratory: Positive shortness of breath Gastrointestinal: No abdominal pain.  No nausea, no vomiting.   Genitourinary: Negative for dysuria. Musculoskeletal: Negative for back pain. Skin: Negative for rash. Neurological: Negative for headaches   ____________________________________________   PHYSICAL EXAM:  VITAL SIGNS: ED Triage Vitals  Enc Vitals Group     BP 09/14/19 2141 (!) 160/68     Pulse Rate  09/14/19 2132 65     Resp 09/14/19 2132 (!) 27     Temp 09/14/19 2132 98.4 F (36.9 C)     Temp Source 09/14/19 2132 Oral     SpO2 09/14/19 2131 98 %     Weight 09/14/19 2133 83 kg (182 lb 15.7 oz)     Height 09/14/19 2133 1.575 m (5\' 2" )     Head Circumference --      Peak Flow --      Pain Score 09/14/19 2133 0     Pain Loc --      Pain Edu? --      Excl. in [place]? --     Constitutional: Alert and oriented.   Nose: No congestion/rhinnorhea.  Cardiovascular: Normal rate, regular rhythm. Grossly normal heart sounds.  Good peripheral circulation. Respiratory: Mildly increased respiratory effort.  No retractions.  Bibasilar Rales Gastrointestinal: Soft and nontender. No distention.  No CVA tenderness.  Musculoskeletal: No lower extremity tenderness nor edema.  Warm and well perfused Neurologic:  Normal speech and language. No gross focal neurologic deficits are appreciated.  Skin:  Skin is warm, dry and intact. No rash noted. Psychiatric: Mood and affect are normal.  Speech and behavior are normal.  ____________________________________________   LABS (all labs ordered are listed, but only abnormal results are displayed)  Labs Reviewed  CBC - Abnormal; Notable for the following components:      Result Value   Hemoglobin 10.7 (*)    MCH 23.7 (*)    MCHC 28.9 (*)    RDW 19.4 (*)    All other components within normal limits  COMPREHENSIVE METABOLIC PANEL - Abnormal; Notable for the following components:   Glucose, Bld 172 (*)    BUN 31 (*)    Creatinine, Ser 1.52 (*)    Calcium 8.5 (*)    AST 65 (*)    GFR calc non Af Amer 31 (*)    GFR calc Af Amer 35 (*)    All other components within normal limits  BRAIN NATRIURETIC PEPTIDE - Abnormal; Notable for the following components:   B Natriuretic Peptide 603.0 (*)    All other components within normal limits  TROPONIN I (HIGH SENSITIVITY) - Abnormal; Notable for the following components:   Troponin I (High Sensitivity) 49 (*)     All other components within normal limits   ____________________________________________  EKG  ED ECG REPORT I, Lavonia Drafts, the attending physician, personally viewed and interpreted this ECG.  Date: 09/14/2019  Rhythm: AV dual paced QRS Axis: normal Intervals: Abnormal ST/T Wave abnormalities: normal   ____________________________________________  RADIOLOGY  Chest x-ray shows mild interstitial edema, improved from prior, viewed by me ____________________________________________   PROCEDURES  Procedure(s) performed: No  Procedures   Critical Care performed: yes  CRITICAL CARE Performed by: Lavonia Drafts   Total critical care time: 30 minutes  Critical care time was exclusive of separately billable procedures and treating other patients.  Critical care was necessary to treat or prevent imminent or life-threatening deterioration.  Critical care was time spent personally by me on the following activities: development of treatment plan with patient and/or surrogate as well as nursing, discussions with consultants, evaluation of patient's response to treatment, examination of patient, obtaining history from patient or surrogate, ordering and performing treatments and interventions, ordering and review of laboratory studies, ordering and review of radiographic studies, pulse oximetry and re-evaluation of patient's condition.  ____________________________________________   INITIAL IMPRESSION / ASSESSMENT AND PLAN / ED COURSE  Pertinent labs & imaging results that were available during my care of the patient were reviewed by me and considered in my medical decision making (see chart for details).  Patient presents with increased short of breath.  EMS had the patient on nonrebreather however we were able to place the patient on 4 L nasal cannula and then 2 L nasal cannula and she appears relatively comfortable.  Her chest x-ray demonstrates mild interstitial edema  however improved from prior.  Lab work demonstrates elevated troponin, this appears chronic for the patient.  We will give IV Lasix and attempt to diurese the patient.  I have asked my colleague to reevaluate the patient after diuresis to see if she will require admission or may be stable for discharge home with close follow-up with her cardiologist or CHF clinic    ____________________________________________   FINAL CLINICAL IMPRESSION(S) / ED DIAGNOSES  Final diagnoses:  Acute on chronic congestive heart failure, unspecified heart failure type [facility])        Note:  This document was prepared using Dragon voice recognition software and may include unintentional dictation errors.   Lavonia Drafts, MD 09/14/19 [id number]    Lavonia Drafts, MD 10/05/19  1429  

## 2019-09-14 NOTE — ED Notes (Signed)
Pt SpO2 98% on 4L [place]. Titrated to 2L [place] per Dr Corky Downs

## 2019-09-14 NOTE — ED Notes (Signed)
Pt given warm blanket.

## 2019-09-15 DIAGNOSIS — I248 Other forms of acute ischemic heart disease: Secondary | ICD-10-CM | POA: Diagnosis present

## 2019-09-15 DIAGNOSIS — Z7901 Long term (current) use of anticoagulants: Secondary | ICD-10-CM | POA: Diagnosis not present

## 2019-09-15 DIAGNOSIS — Z86711 Personal history of pulmonary embolism: Secondary | ICD-10-CM | POA: Diagnosis not present

## 2019-09-15 DIAGNOSIS — Z952 Presence of prosthetic heart valve: Secondary | ICD-10-CM | POA: Diagnosis not present

## 2019-09-15 DIAGNOSIS — D689 Coagulation defect, unspecified: Secondary | ICD-10-CM

## 2019-09-15 DIAGNOSIS — K219 Gastro-esophageal reflux disease without esophagitis: Secondary | ICD-10-CM | POA: Diagnosis present

## 2019-09-15 DIAGNOSIS — I10 Essential (primary) hypertension: Secondary | ICD-10-CM

## 2019-09-15 DIAGNOSIS — Z950 Presence of cardiac pacemaker: Secondary | ICD-10-CM | POA: Diagnosis not present

## 2019-09-15 DIAGNOSIS — Z66 Do not resuscitate: Secondary | ICD-10-CM | POA: Diagnosis present

## 2019-09-15 DIAGNOSIS — I509 Heart failure, unspecified: Secondary | ICD-10-CM

## 2019-09-15 DIAGNOSIS — Z9981 Dependence on supplemental oxygen: Secondary | ICD-10-CM | POA: Diagnosis not present

## 2019-09-15 DIAGNOSIS — J90 Pleural effusion, not elsewhere classified: Secondary | ICD-10-CM | POA: Diagnosis not present

## 2019-09-15 DIAGNOSIS — G4733 Obstructive sleep apnea (adult) (pediatric): Secondary | ICD-10-CM | POA: Diagnosis present

## 2019-09-15 DIAGNOSIS — R600 Localized edema: Secondary | ICD-10-CM | POA: Diagnosis not present

## 2019-09-15 DIAGNOSIS — I4891 Unspecified atrial fibrillation: Secondary | ICD-10-CM | POA: Diagnosis present

## 2019-09-15 DIAGNOSIS — R918 Other nonspecific abnormal finding of lung field: Secondary | ICD-10-CM | POA: Diagnosis not present

## 2019-09-15 DIAGNOSIS — Z20822 Contact with and (suspected) exposure to covid-19: Secondary | ICD-10-CM | POA: Diagnosis present

## 2019-09-15 DIAGNOSIS — I5033 Acute on chronic diastolic (congestive) heart failure: Secondary | ICD-10-CM

## 2019-09-15 DIAGNOSIS — N1832 Chronic kidney disease, stage 3b: Secondary | ICD-10-CM | POA: Diagnosis present

## 2019-09-15 DIAGNOSIS — Z79899 Other long term (current) drug therapy: Secondary | ICD-10-CM | POA: Diagnosis not present

## 2019-09-15 DIAGNOSIS — E11649 Type 2 diabetes mellitus with hypoglycemia without coma: Secondary | ICD-10-CM | POA: Diagnosis not present

## 2019-09-15 DIAGNOSIS — Z794 Long term (current) use of insulin: Secondary | ICD-10-CM | POA: Diagnosis not present

## 2019-09-15 DIAGNOSIS — E782 Mixed hyperlipidemia: Secondary | ICD-10-CM | POA: Diagnosis present

## 2019-09-15 DIAGNOSIS — I110 Hypertensive heart disease with heart failure: Secondary | ICD-10-CM | POA: Diagnosis not present

## 2019-09-15 DIAGNOSIS — R791 Abnormal coagulation profile: Secondary | ICD-10-CM | POA: Diagnosis present

## 2019-09-15 DIAGNOSIS — E1122 Type 2 diabetes mellitus with diabetic chronic kidney disease: Secondary | ICD-10-CM | POA: Diagnosis present

## 2019-09-15 DIAGNOSIS — J9621 Acute and chronic respiratory failure with hypoxia: Secondary | ICD-10-CM

## 2019-09-15 DIAGNOSIS — I5023 Acute on chronic systolic (congestive) heart failure: Secondary | ICD-10-CM | POA: Diagnosis not present

## 2019-09-15 DIAGNOSIS — R0902 Hypoxemia: Secondary | ICD-10-CM | POA: Diagnosis not present

## 2019-09-15 DIAGNOSIS — Z7982 Long term (current) use of aspirin: Secondary | ICD-10-CM | POA: Diagnosis not present

## 2019-09-15 DIAGNOSIS — J449 Chronic obstructive pulmonary disease, unspecified: Secondary | ICD-10-CM | POA: Diagnosis present

## 2019-09-15 DIAGNOSIS — T45515A Adverse effect of anticoagulants, initial encounter: Secondary | ICD-10-CM | POA: Diagnosis present

## 2019-09-15 DIAGNOSIS — I130 Hypertensive heart and chronic kidney disease with heart failure and stage 1 through stage 4 chronic kidney disease, or unspecified chronic kidney disease: Secondary | ICD-10-CM | POA: Diagnosis present

## 2019-09-15 LAB — CBC WITH DIFFERENTIAL/PLATELET
Abs Immature Granulocytes: 0.01 10*3/uL (ref 0.00–0.07)
Basophils Absolute: 0 10*3/uL (ref 0.0–0.1)
Basophils Relative: 1 %
Eosinophils Absolute: 0.1 10*3/uL (ref 0.0–0.5)
Eosinophils Relative: 2 %
HCT: 34.4 % — ABNORMAL LOW (ref 36.0–46.0)
Hemoglobin: 9.9 g/dL — ABNORMAL LOW (ref 12.0–15.0)
Immature Granulocytes: 0 %
Lymphocytes Relative: 23 %
Lymphs Abs: 1.4 10*3/uL (ref 0.7–4.0)
MCH: 23.5 pg — ABNORMAL LOW (ref 26.0–34.0)
MCHC: 28.8 g/dL — ABNORMAL LOW (ref 30.0–36.0)
MCV: 81.7 fL (ref 80.0–100.0)
Monocytes Absolute: 0.7 10*3/uL (ref 0.1–1.0)
Monocytes Relative: 12 %
Neutro Abs: 3.9 10*3/uL (ref 1.7–7.7)
Neutrophils Relative %: 62 %
Platelets: 231 10*3/uL (ref 150–400)
RBC: 4.21 MIL/uL (ref 3.87–5.11)
RDW: 19.3 % — ABNORMAL HIGH (ref 11.5–15.5)
WBC: 6.2 10*3/uL (ref 4.0–10.5)
nRBC: 0 % (ref 0.0–0.2)

## 2019-09-15 LAB — COMPREHENSIVE METABOLIC PANEL
ALT: 27 U/L (ref 0–44)
AST: 52 U/L — ABNORMAL HIGH (ref 15–41)
Albumin: 3.3 g/dL — ABNORMAL LOW (ref 3.5–5.0)
Alkaline Phosphatase: 93 U/L (ref 38–126)
Anion gap: 10 (ref 5–15)
BUN: 30 mg/dL — ABNORMAL HIGH (ref 8–23)
CO2: 31 mmol/L (ref 22–32)
Calcium: 8.7 mg/dL — ABNORMAL LOW (ref 8.9–10.3)
Chloride: 102 mmol/L (ref 98–111)
Creatinine, Ser: 1.32 mg/dL — ABNORMAL HIGH (ref 0.44–1.00)
GFR calc Af Amer: 42 mL/min — ABNORMAL LOW (ref >60)
GFR calc non Af Amer: 36 mL/min — ABNORMAL LOW (ref >60)
Glucose, Bld: 64 mg/dL — ABNORMAL LOW (ref 70–99)
Potassium: 3.3 mmol/L — ABNORMAL LOW (ref 3.5–5.1)
Sodium: 143 mmol/L (ref 135–145)
Total Bilirubin: 0.7 mg/dL (ref 0.3–1.2)
Total Protein: 7.4 g/dL (ref 6.5–8.1)

## 2019-09-15 LAB — TROPONIN I (HIGH SENSITIVITY)
Troponin I (High Sensitivity): 60 ng/L — ABNORMAL HIGH (ref <18)
Troponin I (High Sensitivity): 62 ng/L — ABNORMAL HIGH (ref <18)

## 2019-09-15 LAB — PROTIME-INR
INR: 2.5 — ABNORMAL HIGH (ref 0.8–1.2)
INR: 2.8 — ABNORMAL HIGH (ref 0.8–1.2)
Prothrombin Time: 26.8 seconds — ABNORMAL HIGH (ref 11.4–15.2)
Prothrombin Time: 29.5 seconds — ABNORMAL HIGH (ref 11.4–15.2)

## 2019-09-15 LAB — HEMOGLOBIN A1C
Hgb A1c MFr Bld: 6.2 % — ABNORMAL HIGH (ref 4.8–5.6)
Mean Plasma Glucose: 131.24 mg/dL

## 2019-09-15 LAB — SARS CORONAVIRUS 2 (TAT 6-24 HRS): SARS Coronavirus 2: NEGATIVE

## 2019-09-15 LAB — GLUCOSE, CAPILLARY
Glucose-Capillary: 55 mg/dL — ABNORMAL LOW (ref 70–99)
Glucose-Capillary: 93 mg/dL (ref 70–99)
Glucose-Capillary: 159 mg/dL — ABNORMAL HIGH (ref 70–99)
Glucose-Capillary: 80 mg/dL (ref 70–99)
Glucose-Capillary: 195 mg/dL — ABNORMAL HIGH (ref 70–99)

## 2019-09-15 MED ORDER — SODIUM CHLORIDE 0.9 % IV SOLN: 250.0000 mL | INTRAVENOUS | Status: DC | PRN

## 2019-09-15 MED ORDER — FUROSEMIDE 10 MG/ML IJ SOLN
40.0000 mg | Freq: Two times a day (BID) | INTRAMUSCULAR | Status: DC
  Administered 2019-09-15 – 2019-09-16 (×3): 40 mg via INTRAVENOUS
  Filled 2019-09-15 (×3): qty 4

## 2019-09-15 MED ORDER — POTASSIUM CHLORIDE CRYS ER 20 MEQ PO TBCR
40.0000 meq | EXTENDED_RELEASE_TABLET | Freq: Once | ORAL | Status: AC
  Administered 2019-09-15: 19:00:00 40 meq via ORAL
  Filled 2019-09-15: qty 2

## 2019-09-15 MED ORDER — ASPIRIN EC 81 MG PO TBEC
81.0000 mg | DELAYED_RELEASE_TABLET | Freq: Every day | ORAL | Status: DC
  Administered 2019-09-15 – 2019-09-22 (×8): 81 mg via ORAL
  Filled 2019-09-15 (×9): qty 1

## 2019-09-15 MED ORDER — ALPRAZOLAM 0.5 MG PO TABS: 0.2500 mg | ORAL_TABLET | Freq: Two times a day (BID) | ORAL | Status: DC | PRN

## 2019-09-15 MED ORDER — INSULIN ASPART PROT & ASPART (70-30 MIX) 100 UNIT/ML ~~LOC~~ SUSP
7.0000 [IU] | Freq: Two times a day (BID) | SUBCUTANEOUS | Status: DC
  Administered 2019-09-16 – 2019-09-19 (×7): 7 [IU] via SUBCUTANEOUS
  Filled 2019-09-15 (×7): qty 10

## 2019-09-15 MED ORDER — ZOLPIDEM TARTRATE 5 MG PO TABS: 5.0000 mg | ORAL_TABLET | Freq: Every evening | ORAL | Status: DC | PRN

## 2019-09-15 MED ORDER — SODIUM CHLORIDE 0.9% FLUSH
3.0000 mL | Freq: Two times a day (BID) | INTRAVENOUS | Status: DC
  Administered 2019-09-15 – 2019-09-22 (×14): 3 mL via INTRAVENOUS

## 2019-09-15 MED ORDER — ONDANSETRON HCL 4 MG/2ML IJ SOLN: 4.0000 mg | Freq: Four times a day (QID) | INTRAMUSCULAR | Status: DC | PRN

## 2019-09-15 MED ORDER — FUROSEMIDE 10 MG/ML IJ SOLN
20.0000 mg | Freq: Once | INTRAMUSCULAR | Status: AC
  Administered 2019-09-15: 20 mg via INTRAVENOUS

## 2019-09-15 MED ORDER — PANTOPRAZOLE SODIUM 40 MG PO TBEC
40.0000 mg | DELAYED_RELEASE_TABLET | Freq: Every day | ORAL | Status: DC
  Administered 2019-09-15 – 2019-09-22 (×8): 40 mg via ORAL
  Filled 2019-09-15 (×8): qty 1

## 2019-09-15 MED ORDER — ATORVASTATIN CALCIUM 10 MG PO TABS
10.0000 mg | ORAL_TABLET | Freq: Every day | ORAL | Status: DC
  Administered 2019-09-15 – 2019-09-21 (×7): 10 mg via ORAL
  Filled 2019-09-15 (×7): qty 1

## 2019-09-15 MED ORDER — ALBUTEROL SULFATE (2.5 MG/3ML) 0.083% IN NEBU: 2.5000 mg | INHALATION_SOLUTION | RESPIRATORY_TRACT | Status: DC | PRN

## 2019-09-15 MED ORDER — ACETAMINOPHEN 325 MG PO TABS
650.0000 mg | ORAL_TABLET | ORAL | Status: DC | PRN
  Filled 2019-09-15: qty 2

## 2019-09-15 MED ORDER — ALBUTEROL SULFATE (2.5 MG/3ML) 0.083% IN NEBU
INHALATION_SOLUTION | RESPIRATORY_TRACT | Status: AC
  Administered 2019-09-15: 18:00:00 2.5 mg
  Filled 2019-09-15: qty 3

## 2019-09-15 MED ORDER — POTASSIUM CHLORIDE 20 MEQ PO PACK
40.0000 meq | PACK | Freq: Once | ORAL | Status: AC
  Administered 2019-09-15: 06:00:00 40 meq via ORAL
  Filled 2019-09-15: qty 2

## 2019-09-15 MED ORDER — CARVEDILOL 6.25 MG PO TABS
6.2500 mg | ORAL_TABLET | Freq: Two times a day (BID) | ORAL | Status: DC
  Administered 2019-09-15 – 2019-09-22 (×15): 6.25 mg via ORAL
  Filled 2019-09-15 (×16): qty 1

## 2019-09-15 MED ORDER — HYDRALAZINE HCL 25 MG PO TABS
37.5000 mg | ORAL_TABLET | Freq: Three times a day (TID) | ORAL | Status: DC
  Administered 2019-09-15 – 2019-09-22 (×23): 37.5 mg via ORAL
  Filled 2019-09-15 (×8): qty 2
  Filled 2019-09-15: qty 1.5
  Filled 2019-09-15: qty 2
  Filled 2019-09-15: qty 1.5
  Filled 2019-09-15 (×2): qty 2
  Filled 2019-09-15: qty 1.5
  Filled 2019-09-15 (×5): qty 2
  Filled 2019-09-15: qty 1.5
  Filled 2019-09-15 (×5): qty 2

## 2019-09-15 MED ORDER — IPRATROPIUM-ALBUTEROL 0.5-2.5 (3) MG/3ML IN SOLN
3.0000 mL | Freq: Three times a day (TID) | RESPIRATORY_TRACT | Status: DC
  Administered 2019-09-15 – 2019-09-22 (×20): 3 mL via RESPIRATORY_TRACT
  Filled 2019-09-15 (×20): qty 3

## 2019-09-15 MED ORDER — INSULIN ASPART PROT & ASPART (70-30 MIX) 100 UNIT/ML ~~LOC~~ SUSP
15.0000 [IU] | Freq: Two times a day (BID) | SUBCUTANEOUS | Status: DC
  Administered 2019-09-15: 15 [IU] via SUBCUTANEOUS
  Filled 2019-09-15: qty 10

## 2019-09-15 MED ORDER — FUROSEMIDE 10 MG/ML IJ SOLN
40.0000 mg | Freq: Two times a day (BID) | INTRAMUSCULAR | Status: DC
  Administered 2019-09-15: 04:00:00 40 mg via INTRAVENOUS
  Filled 2019-09-15: qty 4

## 2019-09-15 MED ORDER — WARFARIN - PHARMACIST DOSING INPATIENT
Freq: Every day | Status: DC
  Administered 2019-09-17: 1

## 2019-09-15 MED ORDER — INSULIN ASPART 100 UNIT/ML ~~LOC~~ SOLN
0.0000 [IU] | Freq: Three times a day (TID) | SUBCUTANEOUS | Status: DC
  Administered 2019-09-15 – 2019-09-16 (×4): 2 [IU] via SUBCUTANEOUS
  Administered 2019-09-16: 3 [IU] via SUBCUTANEOUS
  Administered 2019-09-16: 22:00:00 5 [IU] via SUBCUTANEOUS
  Administered 2019-09-17 (×2): 2 [IU] via SUBCUTANEOUS
  Administered 2019-09-17: 5 [IU] via SUBCUTANEOUS
  Administered 2019-09-17 – 2019-09-18 (×2): 2 [IU] via SUBCUTANEOUS
  Administered 2019-09-18 (×2): 3 [IU] via SUBCUTANEOUS
  Administered 2019-09-18: 2 [IU] via SUBCUTANEOUS
  Administered 2019-09-19: 3 [IU] via SUBCUTANEOUS
  Administered 2019-09-19: 7 [IU] via SUBCUTANEOUS
  Administered 2019-09-19: 2 [IU] via SUBCUTANEOUS
  Administered 2019-09-20: 17:00:00 7 [IU] via SUBCUTANEOUS
  Administered 2019-09-20 (×2): 2 [IU] via SUBCUTANEOUS
  Administered 2019-09-20: 5 [IU] via SUBCUTANEOUS
  Filled 2019-09-15 (×21): qty 1

## 2019-09-15 MED ORDER — DILTIAZEM HCL ER COATED BEADS 120 MG PO CP24
240.0000 mg | ORAL_CAPSULE | Freq: Every day | ORAL | Status: DC
  Administered 2019-09-15 – 2019-09-22 (×8): 240 mg via ORAL
  Filled 2019-09-15 (×5): qty 2
  Filled 2019-09-15: qty 1
  Filled 2019-09-15 (×2): qty 2

## 2019-09-15 MED ORDER — SODIUM CHLORIDE 0.9% FLUSH
3.0000 mL | INTRAVENOUS | Status: DC | PRN
  Administered 2019-09-20: 3 mL via INTRAVENOUS

## 2019-09-15 MED ORDER — WARFARIN SODIUM 7.5 MG PO TABS
7.5000 mg | ORAL_TABLET | Freq: Once | ORAL | Status: AC
  Administered 2019-09-15: 22:00:00 7.5 mg via ORAL
  Filled 2019-09-15: qty 1

## 2019-09-15 NOTE — Progress Notes (Signed)
PROGRESS NOTE    Kimberly Peterson  IFO:[id number] DOB: 08-Apr-1930 DOA: 09/14/2019 PCP: [name], MD   Brief Narrative:  Kimberly Peterson  is Kimberly Peterson 86 y.o. female with Cleora Karnik known history of CHF, chronic kidney disease, COPD, type 2 diabetes mellitus and hypertension, presented to the emergency room with acute onset of worsening dyspnea for about Lakeisa Heninger week with associated increased oxygen requirement above her baseline of 2 L/min with chronic respiratory failure.  She stated that [name] is not getting her enough diuresis.  She denied any cough or wheezing.  No fever or chills. No nausea or vomiting or abdominal pain.  No dysuria, oliguria or hematuria or flank pain.  Upon presentation to the emergency room, respiratory rate was 27 and pulse ox 97% on 4 L of O2 by nasal cannula after being initially placed on nonrebreather by EMS.  Respiratory rate was later 33.  CBC showed  hemoglobin of 10.7 hematocrit 37 better than previous levels.  BNP was 603.  CMP was remarkable for Regan Mcbryar BUN of 31 and creatinine 1.52 compared to 18/1.1 on 07/16/2019 with GFR of 35 compared to 52 then.  For which x-ray showed mild CHF looking better than previous chest x-ray.  EKG showed normal sinus rhythm with rate of 65 with nonspecific intraventricular conduction delay.  The patient was given 40 mg of IV Lasix.  She continued to have dyspnea and increased oxygen requirement.  She will be admitted to an observation telemetry bed for further evaluation and management.  Assessment & Plan:   Active Problems:   Acute CHF (congestive heart failure) (HCC)   Acute on chronic diastolic CHF (congestive heart failure) ([place])   1.  Acute on chronic diastolic CHF with subsequent acute on chronic hypoxic respiratory failure. - Continue lasix 40 IV BID (on torsemide 40 mg daily at home, may need increased dose?) - Troponins elevated, but flat (similar to prior values), not suggestive of ACS - EKG appears similar to priors - echo from 07/12/19 with  EF 60-65%, no RWMA -will need outpatient cardiology follow up, consider cardiology c/s here as needed -We will continue Coreg and hydralazine. - currently back on home 2 L, but still SOB, will continue diuresis - I/O, daily weights  2.  Coagulopathy secondary to Coumadin, for history of atrial fibrillation.  Patient is status post aortic valve replacement.  S/p Pacemaker placement. -warfarin per pharmacy  3.  Acute kidney injury superimposed on stage IIIb chronic kidney disease. - baseline creatinine 1.10, 1.52 at presentation, likely 2/2 HF -improving with diuresis, continue to monitor  4.  Hypertension. -We will continue Coreg, hydralazine and Cartia XT.  5.  Type II diabetes mellitus  Hypoglycemia - Hypoglycemia this AM, decrease 70/30 and follow - Continue SSI  6.  GERD. -PPI therapy will be resumed.  7.  Dyslipidemia. -We will continue statin therapy.  DVT prophylaxis: warfarin Code Status: DNR Family Communication: none at bedside Disposition Plan:  . Patient came from: home            . Anticipated d/c place: home . Barriers to d/c OR conditions which need to be met to effect Apurva Reily safe d/c: pending further improvement in SOB, IV diuresis Consultants:   none  Procedures:   none  Antimicrobials:  Anti-infectives (From admission, onward)   None     Subjective: Feels better, but still SOB Still feels she has extra fluid  Objective: Vitals:   09/15/19 1215 09/15/19 1245 09/15/19 1300 09/15/19 1330  BP: Marland Kitchen)  133/48 (!) 131/58 127/72 137/66  Pulse: 66 65 65 62  Resp: 20 20 (!) 23 16  Temp:      TempSrc:      SpO2: 100% 99% 97% 100%  Weight:      Height:        Intake/Output Summary (Last 24 hours) at 09/15/2019 1428 Last data filed at 09/15/2019 1055 Gross per 24 hour  Intake --  Output 2675 ml  Net -2675 ml   Filed Weights   09/14/19 2133  Weight: 83 kg    Examination:  General exam: Appears calm and comfortable  Respiratory system:  Clear to auscultation. Respiratory effort normal. Cardiovascular system: S1 & S2 heard, RRR.  Gastrointestinal system: Abdomen is nondistended, soft and nontender. Central nervous system: Alert and oriented. No focal neurological deficits. Extremities: bilateral LE edema, dependent sacral edema, chronic R>L LE edema Skin: No rashes, lesions or ulcers Psychiatry: Judgement and insight appear normal. Mood & affect appropriate.     Data Reviewed: I have personally reviewed following labs and imaging studies  CBC: Recent Labs  Lab 09/14/19 2133 09/15/19 0450  WBC 6.0 6.2  NEUTROABS  --  3.9  HGB 10.7* 9.9*  HCT 37.0 34.4*  MCV 81.9 81.7  PLT 236 903   Basic Metabolic Panel: Recent Labs  Lab 09/14/19 2133 09/15/19 0618  NA 139 143  K 3.7 3.3*  CL 101 102  CO2 30 31  GLUCOSE 172* 64*  BUN 31* 30*  CREATININE 1.52* 1.32*  CALCIUM 8.5* 8.7*   GFR: Estimated Creatinine Clearance: 30 mL/min (Devoiry Corriher) (by C-G formula based on SCr of 1.32 mg/dL (H)). Liver Function Tests: Recent Labs  Lab 09/14/19 2133 09/15/19 0618  AST 65* 52*  ALT 28 27  ALKPHOS 100 93  BILITOT 0.7 0.7  PROT 7.6 7.4  ALBUMIN 3.5 3.3*   No results for input(s): LIPASE, AMYLASE in the last 168 hours. No results for input(s): AMMONIA in the last 168 hours. Coagulation Profile: Recent Labs  Lab 09/12/19 1156 09/14/19 2133  INR 5.9* 2.5*   Cardiac Enzymes: No results for input(s): CKTOTAL, CKMB, CKMBINDEX, TROPONINI in the last 168 hours. BNP (last 3 results) No results for input(s): PROBNP in the last 8760 hours. HbA1C: Recent Labs    09/15/19 0450  HGBA1C 6.2*   CBG: Recent Labs  Lab 09/15/19 0805 09/15/19 0936 09/15/19 1156  GLUCAP 55* 93 159*   Lipid Profile: No results for input(s): CHOL, HDL, LDLCALC, TRIG, CHOLHDL, LDLDIRECT in the last 72 hours. Thyroid Function Tests: No results for input(s): TSH, T4TOTAL, FREET4, T3FREE, THYROIDAB in the last 72 hours. Anemia Panel: No results  for input(s): VITAMINB12, FOLATE, FERRITIN, TIBC, IRON, RETICCTPCT in the last 72 hours. Sepsis Labs: No results for input(s): PROCALCITON, LATICACIDVEN in the last 168 hours.  Recent Results (from the past 240 hour(s))  SARS CORONAVIRUS 2 (TAT 6-24 HRS) Nasopharyngeal Nasopharyngeal Swab     Status: None   Collection Time: 09/15/19 12:31 AM   Specimen: Nasopharyngeal Swab  Result Value Ref Range Status   SARS Coronavirus 2 NEGATIVE NEGATIVE Final    Comment: (NOTE) SARS-CoV-2 target nucleic acids are NOT DETECTED. The SARS-CoV-2 RNA is generally detectable in upper and lower respiratory specimens during the acute phase of infection. Negative results do not preclude SARS-CoV-2 infection, do not rule out co-infections with other pathogens, and should not be used as the sole basis for treatment or other patient management decisions. Negative results must be combined with clinical observations, patient  history, and epidemiological information. The expected result is Negative. Fact Sheet for Patients: SugarRoll.be Fact Sheet for Healthcare Providers: https://www.woods-mathews.com/ This test is not yet approved or cleared by the [place] FDA and  has been authorized for detection and/or diagnosis of SARS-CoV-2 by FDA under an Emergency Use Authorization (EUA). This EUA will remain  in effect (meaning this test can be used) for the duration of the COVID-19 declaration under Section 56 4(b)(1) of the Act, 21 U.S.C. section 360bbb-3(b)(1), unless the authorization is terminated or revoked sooner. Performed at [facility], [place] [address] [place], [place] [address]          Radiology Studies: DG Chest Port 1 View  Result Date: 09/14/2019 CLINICAL DATA:  Shortness of breath EXAM: PORTABLE CHEST 1 VIEW COMPARISON:  07/11/2019 FINDINGS: Cardiac shadow is enlarged. Postsurgical changes and pacing device are again seen. Persistent  vascular congestion is noted but mildly improved when compared with prior exam. Mild interstitial edema is seen. No bony abnormality is noted. IMPRESSION: Changes of mild CHF slightly improved when compared with the prior exam. Electronically Signed   By: Inez Catalina M.D.   On: 09/14/2019 21:43        Scheduled Meds: . aspirin EC  81 mg Oral Daily  . atorvastatin  10 mg Oral Daily  . carvedilol  6.25 mg Oral BID WC  . diltiazem  240 mg Oral Daily  . furosemide  40 mg Intravenous BID  . hydrALAZINE  37.5 mg Oral TID  . insulin aspart  0-9 Units Subcutaneous TID PC & HS  . insulin aspart protamine- aspart  15 Units Subcutaneous BID WC  . pantoprazole  40 mg Oral Daily  . sodium chloride flush  3 mL Intravenous Q12H   Continuous Infusions: . sodium chloride       LOS: 1 day    Time spent: over 30 min    Fayrene Helper, MD [facility]   To contact the attending provider between 7A-7P or the covering provider during after hours 7P-7A, please log into the web site [email or web address] and access using universal [place] password for that web site. If you do not have the password, please call the hospital operator.  09/15/2019, 2:28 PM

## 2019-09-15 NOTE — Progress Notes (Signed)
Patient given one time dose of 20mg  IV furosemide, and neb treatments for respiratory distress, she reports she is breathing better and respirations have decreased along with decreased use of accessory muscles.

## 2019-09-15 NOTE — ED Notes (Signed)
Pt assisted into recliner.

## 2019-09-15 NOTE — Progress Notes (Signed)
   09/15/19 1838  Clinical Encounter Type  Visited With Patient  Visit Type Initial  Referral From Nurse  Consult/Referral To Chaplain  Advance Directives (For Healthcare)  Does Patient Have a Medical Advance Directive? Yes  Does patient want to make changes to medical advance directive? No - Patient declined  Type of Paramedic of [place];Living will  Copy of [place] in Chart? No - copy requested  Copy of Living Will in Chart? No - copy requested  Mental Health Advance Directives  Does Patient Have a Mental Health Advance Directive? No  Would patient like information on creating a mental health advance directive? No - Patient declined  Chaplain receive page at 5:10 for a RR and when she got there she wasn't needed. Chaplain could hear doctors asking patient questions and she was responding. Chaplain went back to visit her ard 6:45. Chaplain opened patient's apple sauce and cut up her chicken. Chaplain did not want patient's food to get cold so she told patient she would be praying for her and would have someone check on her tomorrow. Patient said thank you for the prayers, that what I need.

## 2019-09-15 NOTE — Consult Note (Signed)
[place] for Warfarin Indication: atrial fibrillation  No Known Allergies  Patient Measurements: Height: 5\' 2"  (157.5 cm) Weight: 196 lb 4.8 oz (89 kg) IBW/kg (Calculated) : 50.1   Vital Signs: BP: 150/75 (03/11 1719) Pulse Rate: 62 (03/11 1721)  Labs: Recent Labs    09/14/19 2133 09/15/19 0450 09/15/19 0618  HGB 10.7* 9.9*  --   HCT 37.0 34.4*  --   PLT 236 231  --   LABPROT 26.8*  --   --   INR 2.5*  --   --   CREATININE 1.52*  --  1.32*  TROPONINIHS 49* 60* 62*    Estimated Creatinine Clearance: 31.1 mL/min (A) (by C-G formula based on SCr of 1.32 mg/dL (H)).   Medications:  Warfarin 7.5 mg Thursday and 5 mg all other days- confirmed with patient dose and last dose was 3/10 (evening)  Assessment: Kimberly Peterson  is a 86 y.o. female with a known history of CHF, CKD, COPD, T2DM, HTN who presented to the ED with acute onset of worsening dyspnea for about a week.   Date  INR  Dose  3/10  2.5  None  3/11  2.8   Today's INR: Therapeutic     Goal of Therapy:  Heparin level 0.3-0.7 units/ml Monitor platelets by anticoagulation protocol: Yes   Plan:  1. Will continue home dose of warfarin 7.5 mg x1 and will follow INR daily and CBC every 3 days.    Kimberly Peterson 09/15/2019,5:35 PM

## 2019-09-15 NOTE — Progress Notes (Signed)
Patient to room from ED, on the way up patient told transport "she couldn't breathe," ED called to notify RN to be in room, RN's present upon arrival, patient dyspneic with pursed lipped breathing and use of accessory muscles, RRT called for respiratory support. MD notified for further assessment of patient and orders. Patient v/s stable at this time with exception of Resp Rate

## 2019-09-15 NOTE — ED Notes (Signed)
Pt cbg noted to be 57- pt given cup of grape juice

## 2019-09-15 NOTE — H&P (Addendum)
[facility] NAME: Kimberly Peterson    MR#:  [id number]  DATE OF BIRTH:  09-Dec-1929  DATE OF ADMISSION:  09/14/2019  PRIMARY CARE PHYSICIAN: [name], MD   REQUESTING/REFERRING PHYSICIAN: Marjean Donna, MD  CHIEF COMPLAINT:   Chief Complaint  Patient presents with  . Shortness of Breath    HISTORY OF PRESENT ILLNESS:  Kimberly Peterson  is a 86 y.o. female with a known history of CHF, chronic kidney disease, COPD, type 2 diabetes mellitus and hypertension, presented to the emergency room with acute onset of worsening dyspnea for about a week with associated increased oxygen requirement above her baseline of 2 L/min with chronic respiratory failure.  She stated that [name] is not getting her enough diuresis.  She denied any cough or wheezing.  No fever or chills. No nausea or vomiting or abdominal pain.  No dysuria, oliguria or hematuria or flank pain.  Upon presentation to the emergency room, respiratory rate was 27 and pulse ox 97% on 4 L of O2 by nasal cannula after being initially placed on nonrebreather by EMS.  Respiratory rate was later 33.  CBC showed  hemoglobin of 10.7 hematocrit 37 better than previous levels.  BNP was 603.  CMP was remarkable for a BUN of 31 and creatinine 1.52 compared to 18/1.1 on 07/16/2019 with GFR of 35 compared to 52 then.  For which x-ray showed mild CHF looking better than previous chest x-ray.  EKG showed normal sinus rhythm with rate of 65 with nonspecific intraventricular conduction delay.  The patient was given 40 mg of IV Lasix.  She continued to have dyspnea and increased oxygen requirement.  She will be admitted to an observation telemetry bed for further evaluation and management. PAST MEDICAL HISTORY:   Past Medical History:  Diagnosis Date  . Anemia   . CHF (congestive heart failure) ([place])   . Chronic kidney disease   . COPD (chronic obstructive pulmonary disease) ([place])   . Diabetes mellitus without  complication ([place])   . Hypertension   Status post aortic valve replacement, on Coumadin.  PAST SURGICAL HISTORY:   Past Surgical History:  Procedure Laterality Date  . aoric valve replacemet      st Jude  . CATARACT EXTRACTION, BILATERAL    . COLONOSCOPY WITH PROPOFOL N/A 07/17/2019   Procedure: COLONOSCOPY WITH PROPOFOL;  Surgeon: Lin Landsman, MD;  Location: [facility] ENDOSCOPY;  Service: Gastroenterology;  Laterality: N/A;  . ESOPHAGOGASTRODUODENOSCOPY N/A 07/17/2019   Procedure: ESOPHAGOGASTRODUODENOSCOPY (EGD);  Surgeon: Lin Landsman, MD;  Location: [facility] ENDOSCOPY;  Service: Gastroenterology;  Laterality: N/A;  . PACEMAKER PLACEMENT    . VALVE REPLACEMENT      SOCIAL HISTORY:   Social History   Tobacco Use  . Smoking status: Never Smoker  . Smokeless tobacco: Never Used  Substance Use Topics  . Alcohol use: No    FAMILY HISTORY:  No pertinent family history.  DRUG ALLERGIES:  No Known Allergies  REVIEW OF SYSTEMS:   ROS As per history of present illness. All pertinent systems were reviewed above. Constitutional,  HEENT, cardiovascular, respiratory, GI, GU, musculoskeletal, neuro, psychiatric, endocrine,  integumentary and hematologic systems were reviewed and are otherwise  negative/unremarkable except for positive findings mentioned above in the HPI.   MEDICATIONS AT HOME:   Prior to Admission medications   Medication Sig Start Date End Date Taking? Authorizing Provider  aspirin 81 MG tablet Take 81 mg by mouth daily.  Yes [provider]  atorvastatin (LIPITOR) 10 MG tablet TAKE 1 TABLET BY MOUTH ONCE DAILY FOR CHOLESTEROL Patient taking differently: Take 10 mg by mouth daily.  12/06/18  Yes Scarboro, Audie Clear, NP  CARTIA XT 240 MG 24 hr capsule Take 1 capsule by mouth once daily Patient taking differently: Take 240 mg by mouth daily.  04/06/19  Yes [name], MD  carvedilol (COREG) 6.25 MG tablet Take 6.25 mg by mouth 2 (two) times daily  with a meal.    Yes [provider]  hydrALAZINE (APRESOLINE) 25 MG tablet TAKE 1 1/2 TABLETS BY MOUTH THREE TIMES DAILY FOR BLOOD PRESSURE Patient taking differently: Take 37.5 mg by mouth 3 (three) times daily.  09/05/19  Yes Boscia, Heather E, NP  insulin aspart protamine- aspart (NOVOLOG MIX 70/30) (70-30) 100 UNIT/ML injection Inject 0.17 mLs (17 Units total) into the skin 2 (two) times daily with a meal. Patient taking differently: Inject 20 Units into the skin 2 (two) times daily with a meal.  07/25/19  Yes Boscia, Heather E, NP  pantoprazole (PROTONIX) 40 MG tablet Take one tab po qd for stomach Patient taking differently: Take 40 mg by mouth daily.  03/22/19  Yes [name], MD  torsemide ([name]) 20 MG tablet Take 2 tablets (40 mg total) by mouth daily. 05/01/19  Yes Hillary Bow, MD  warfarin (COUMADIN) 5 MG tablet Take 1-1.5 tablets (5-7.5 mg total) by mouth See admin instructions. Take 1 tablet (5mg ) by mouth every Monday, Tuesday. Wednesday, Friday, Saturday and Sunday evening and take 1 tablets (7.5mg ) by mouth every Thursday evening 07/18/19  Yes Dhungel, Nishant, MD      VITAL SIGNS:  Blood pressure (!) 137/59, pulse 65, temperature 98.4 F (36.9 C), temperature source Oral, resp. rate (!) 30, height 5\' 2"  (1.575 m), weight 83 kg, SpO2 95 %.  PHYSICAL EXAMINATION:  Physical Exam  GENERAL:  86 y.o.-year-old African-American female patient lying in the bed with mild respiratory distress with conversational dyspnea.   EYES: Pupils equal, round, reactive to light and accommodation. No scleral icterus. Extraocular muscles intact.  HEENT: Head atraumatic, normocephalic. Oropharynx and nasopharynx clear.  NECK:  Supple, no jugular venous distention. No thyroid enlargement, no tenderness.  LUNGS: Diminished bibasilar breath sounds with bibasal rales. CARDIOVASCULAR: Regular rate and rhythm, S1, S2 normal. No murmurs, rubs, or gallops.  ABDOMEN: Soft, nondistended,  nontender. Bowel sounds present. No organomegaly or mass.  EXTREMITIES: 1-2+ bilateral lower extremity pitting edema with no cyanosis, or clubbing.  NEUROLOGIC: Cranial nerves II through XII are intact. Muscle strength 5/5 in all extremities. Sensation intact. Gait not checked.  PSYCHIATRIC: The patient is alert and oriented x 3.  Normal affect and good eye contact. SKIN: No obvious rash, lesion, or ulcer.   LABORATORY PANEL:   CBC Recent Labs  Lab 09/14/19 2133  WBC 6.0  HGB 10.7*  HCT 37.0  PLT 236   ------------------------------------------------------------------------------------------------------------------  Chemistries  Recent Labs  Lab 09/14/19 2133  NA 139  K 3.7  CL 101  CO2 30  GLUCOSE 172*  BUN 31*  CREATININE 1.52*  CALCIUM 8.5*  AST 65*  ALT 28  ALKPHOS 100  BILITOT 0.7   ------------------------------------------------------------------------------------------------------------------  Cardiac Enzymes No results for input(s): TROPONINI in the last 168 hours. ------------------------------------------------------------------------------------------------------------------  RADIOLOGY:  DG Chest Port 1 View  Result Date: 09/14/2019 CLINICAL DATA:  Shortness of breath EXAM: PORTABLE CHEST 1 VIEW COMPARISON:  07/11/2019 FINDINGS: Cardiac shadow is enlarged. Postsurgical changes and  pacing device are again seen. Persistent vascular congestion is noted but mildly improved when compared with prior exam. Mild interstitial edema is seen. No bony abnormality is noted. IMPRESSION: Changes of mild CHF slightly improved when compared with the prior exam. Electronically Signed   By: Inez Catalina M.D.   On: 09/14/2019 21:43      IMPRESSION AND PLAN:   1.  Acute on chronic diastolic CHF with subsequent acute on chronic hypoxic respiratory failure. -The patient will be admitted to an observation telemetry bed and will be diuresed with IV Lasix.   -Will follow  serial troponin I's.  Elevated troponin is likely due to demand ischemia with acute CHF though  will need to rule out ACS that is contributing. -Will obtain  a cardiology consultation in a.m. I notified Dr. Rayann Heman about the patient. -The patient had a 2D echo on 07/12/2019 and it revealed an EF of 60 to 65%. -We will continue Coreg and hydralazine. -O2 protocol will be followed.  2.  Coagulopathy secondary to Coumadin, for history of atrial fibrillation.  Patient is status post aortic valve replacement. -Coumadin will be held off and INR will be followed.  With improvement of INR it can be resumed.  3.  Acute kidney injury superimposed on stage IIIb chronic kidney disease. -This likely prerenal secondary to acute CHF. -The patient will be diuresed as above and BMPs will be followed.  4.  Hypertension. -We will continue Coreg, hydralazine and Cartia XT.  5.  Type II diabetes mellitus. -The patient will be placed on supplemental coverage with NovoLog. -We will continue basal coverage with NovoLog Mix 70/30.  6.  GERD. -PPI therapy will be resumed.  7.  Dyslipidemia. -We will continue statin therapy.  8.  DVT prophylaxis. -The patient has supertherapeutic INR at this time.  Coumadin is being held off.  Will follow INR.   All the records are reviewed and case discussed with ED provider. The plan of care was discussed in details with the patient (and family). I answered all questions. The patient agreed to proceed with the above mentioned plan. Further management will depend upon hospital course.   CODE STATUS: This was discussed with the patient and she desires to be DNR/DNI.  She has been DNR/DNI on her previous admission as well.  TOTAL TIME TAKING CARE OF THIS PATIENT: 55 minutes.    Christel Mormon M.D on 09/15/2019 at 12:47 AM  [facility]   From 7 PM-7 AM, contact night-coverage [email or web address]  CC: Primary care physician; [name], MD   Note: This dictation  was prepared with Dragon dictation along with smaller phrase technology. Any transcriptional errors that result from this process are unintentional.

## 2019-09-16 ENCOUNTER — Inpatient Hospital Stay: Payer: Medicare Other

## 2019-09-16 LAB — BASIC METABOLIC PANEL
Anion gap: 9 (ref 5–15)
BUN: 28 mg/dL — ABNORMAL HIGH (ref 8–23)
CO2: 30 mmol/L (ref 22–32)
Calcium: 8.9 mg/dL (ref 8.9–10.3)
Chloride: 103 mmol/L (ref 98–111)
Creatinine, Ser: 1.43 mg/dL — ABNORMAL HIGH (ref 0.44–1.00)
GFR calc Af Amer: 38 mL/min — ABNORMAL LOW (ref >60)
GFR calc non Af Amer: 33 mL/min — ABNORMAL LOW (ref >60)
Glucose, Bld: 162 mg/dL — ABNORMAL HIGH (ref 70–99)
Potassium: 4.3 mmol/L (ref 3.5–5.1)
Sodium: 142 mmol/L (ref 135–145)

## 2019-09-16 LAB — PROTIME-INR
INR: 2.6 — ABNORMAL HIGH (ref 0.8–1.2)
Prothrombin Time: 27.9 seconds — ABNORMAL HIGH (ref 11.4–15.2)

## 2019-09-16 LAB — CBC
HCT: 34.2 % — ABNORMAL LOW (ref 36.0–46.0)
Hemoglobin: 10 g/dL — ABNORMAL LOW (ref 12.0–15.0)
MCH: 23.8 pg — ABNORMAL LOW (ref 26.0–34.0)
MCHC: 29.2 g/dL — ABNORMAL LOW (ref 30.0–36.0)
MCV: 81.4 fL (ref 80.0–100.0)
Platelets: 231 10*3/uL (ref 150–400)
RBC: 4.2 MIL/uL (ref 3.87–5.11)
RDW: 19.8 % — ABNORMAL HIGH (ref 11.5–15.5)
WBC: 6.2 10*3/uL (ref 4.0–10.5)
nRBC: 0 % (ref 0.0–0.2)

## 2019-09-16 LAB — GLUCOSE, CAPILLARY
Glucose-Capillary: 185 mg/dL — ABNORMAL HIGH (ref 70–99)
Glucose-Capillary: 242 mg/dL — ABNORMAL HIGH (ref 70–99)
Glucose-Capillary: 200 mg/dL — ABNORMAL HIGH (ref 70–99)
Glucose-Capillary: 280 mg/dL — ABNORMAL HIGH (ref 70–99)

## 2019-09-16 LAB — BRAIN NATRIURETIC PEPTIDE: B Natriuretic Peptide: 861 pg/mL — ABNORMAL HIGH (ref 0.0–100.0)

## 2019-09-16 MED ORDER — FUROSEMIDE 10 MG/ML IJ SOLN: 60.0000 mg | Freq: Two times a day (BID) | INTRAMUSCULAR | Status: DC

## 2019-09-16 MED ORDER — WARFARIN SODIUM 5 MG PO TABS
5.0000 mg | ORAL_TABLET | Freq: Once | ORAL | Status: AC
  Administered 2019-09-16: 5 mg via ORAL
  Filled 2019-09-16: qty 1

## 2019-09-16 MED ORDER — FUROSEMIDE 10 MG/ML IJ SOLN: 80.0000 mg | Freq: Two times a day (BID) | INTRAMUSCULAR | Status: DC

## 2019-09-16 MED ORDER — FUROSEMIDE 10 MG/ML IJ SOLN
80.0000 mg | Freq: Two times a day (BID) | INTRAMUSCULAR | Status: DC
  Administered 2019-09-17 – 2019-09-20 (×7): 80 mg via INTRAVENOUS
  Filled 2019-09-16 (×8): qty 8

## 2019-09-16 NOTE — Consult Note (Signed)
[facility] for Warfarin Indication: atrial fibrillation  No Known Allergies  Patient Measurements: Height: 5\' 2"  (157.5 cm) Weight: 200 lb 14.4 oz (91.1 kg) IBW/kg (Calculated) : 50.1   Vital Signs: Temp: 98.7 F (37.1 C) (03/12 0523) Temp Source: Oral (03/12 0523) BP: 125/56 (03/12 0523) Pulse Rate: 65 (03/12 0523)  Labs: Recent Labs    09/14/19 2133 09/14/19 2133 09/15/19 0450 09/15/19 0618 09/15/19 1912 09/16/19 0456  HGB 10.7*   < > 9.9*  --   --  10.0*  HCT 37.0  --  34.4*  --   --  34.2*  PLT 236  --  231  --   --  231  LABPROT 26.8*  --   --   --  29.5* 27.9*  INR 2.5*  --   --   --  2.8* 2.6*  CREATININE 1.52*  --   --  1.32*  --  1.43*  TROPONINIHS 49*  --  60* 62*  --   --    < > = values in this interval not displayed.    Estimated Creatinine Clearance: 29.1 mL/min (A) (by C-G formula based on SCr of 1.43 mg/dL (H)).   Medications:  Warfarin 7.5 mg Thursday and 5 mg all other days- confirmed with patient dose and last dose was 3/10 (evening)  Assessment: Kimberly Peterson  is a 86 y.o. female with a known history of Afib (on warfarin), CHF, CKD, COPD, T2DM, HTN who presented to the ED with acute onset of worsening dyspnea for about a week. Pharmacy consulted to manage warfarin inpatient.   Date  INR  Dose  3/10  2.5  5 mg (patient reported) 3/11  2.8  7.5 mg 3/12  2.6    Today's INR: Therapeutic  H&H, platelets stable   Goal of Therapy:  INR 2-3 Monitor platelets by anticoagulation protocol: Yes   Plan:  - Warfarin 5 mg tonight.  - Follow INR tomorrow morning - CBC every 3 days per protocol   Lovena Le A Oluwadarasimi Redmon 09/16/2019,7:59 AM

## 2019-09-16 NOTE — Progress Notes (Addendum)
PROGRESS NOTE    Kimberly Peterson  TIR:[id number] DOB: [birth year] DOA: 09/14/2019 PCP: Kimberly Guise, MD   Brief Narrative:  Kimberly Peterson  is Kimberly Peterson 86 y.o. female with Kimberly Peterson known history of CHF, chronic kidney disease, COPD, type 2 diabetes mellitus and hypertension, presented to the emergency room with acute onset of worsening dyspnea for about Kimberly Peterson week with associated increased oxygen requirement above her baseline of 2 L/min with chronic respiratory failure.  She stated that [name] is not getting her enough diuresis.  She denied any cough or wheezing.  No fever or chills. No nausea or vomiting or abdominal pain.  No dysuria, oliguria or hematuria or flank pain.  Upon presentation to the emergency room, respiratory rate was 27 and pulse ox 97% on 4 L of O2 by nasal cannula after being initially placed on nonrebreather by EMS.  Respiratory rate was later 33.  CBC showed  hemoglobin of 10.7 hematocrit 37 better than previous levels.  BNP was 603.  CMP was remarkable for Nilo Fallin BUN of 31 and creatinine 1.52 compared to 18/1.1 on 07/16/2019 with GFR of 35 compared to 52 then.  For which x-ray showed mild CHF looking better than previous chest x-ray.  EKG showed normal sinus rhythm with rate of 65 with nonspecific intraventricular conduction delay.  The patient was given 40 mg of IV Lasix.  She continued to have dyspnea and increased oxygen requirement.  She will be admitted to an observation telemetry bed for further evaluation and management.  Assessment & Plan:   Active Problems:   Acute CHF (congestive heart failure) ([facility])   Acute on chronic diastolic CHF (congestive heart failure) ([place])   Heart failure ([place])   1.  Acute on chronic diastolic CHF with subsequent acute on chronic hypoxic respiratory failure. -Continue lasix 40 IV BID (on torsemide 40 mg daily at home, may need increased dose?) - Troponins elevated, but flat (similar to prior values), not suggestive of ACS - EKG appears similar to priors -  echo from 07/12/19 with EF 60-65%, no RWMA -will need outpatient cardiology follow up, consider cardiology c/s here as needed if difficulty with diuresis -We will continue Coreg and hydralazine. - uses 2 L at home, now on 4 L  - I/O, daily weights - standing weight of 89.4 kg 3/12 (was 84.2 kg on 1/9).  Net negative 3.4 L.   - CXR 3/12 without substantial change, low volume film with vascular congestion and R base collapse/consolidation with small R pleural effusion  Wt Readings from Last 3 Encounters:  09/16/19 89.4 kg  09/09/19 83.3 kg  08/23/19 83.4 kg   2.  Coagulopathy secondary to Coumadin, for history of atrial fibrillation.  Patient is status post aortic valve replacement.  S/p Pacemaker placement. -warfarin per pharmacy  3.  Acute kidney injury superimposed on stage IIIb chronic kidney disease. - baseline creatinine 1.10, 1.52 at presentation, likely 2/2 HF - creatinine 1.43 today, fluctuating, follow with diuresis  4.  Hypertension. -We will continue Coreg, hydralazine and Cartia XT.  5.  Type II diabetes mellitus  Hypoglycemia - Continue 70/30 and SSI - adjust as needed  6.  GERD. -PPI therapy will be resumed.  7.  Dyslipidemia. -We will continue statin therapy.  DVT prophylaxis: warfarin Code Status: DNR Family Communication: none at bedside - daughter Disposition Plan:  . Patient came from: home            . Anticipated d/c place: home . Barriers to d/c OR conditions  which need to be met to effect [name] safe d/c: pending further improvement in SOB, IV diuresis Consultants:   none  Procedures:   none  Antimicrobials:  Anti-infectives (From admission, onward)   None     Subjective: Feels better, but still SOB with movement  Objective: Vitals:   09/16/19 0747 09/16/19 0811 09/16/19 1006 09/16/19 1144  BP:  136/66  (!) 122/46  Pulse:  66  64  Resp:  18  18  Temp:  98.8 F (37.1 C)  98 F (36.7 C)  TempSrc:  Oral  Oral  SpO2: 96% 98%  97%   Weight:   89.4 kg   Height:        Intake/Output Summary (Last 24 hours) at 09/16/2019 1155 Last data filed at 09/16/2019 0900 Gross per 24 hour  Intake 363 ml  Output 1100 ml  Net -737 ml   Filed Weights   09/15/19 1729 09/16/19 0523 09/16/19 1006  Weight: 89 kg 91.1 kg 89.4 kg    Examination:  General: No acute distress. Cardiovascular: Heart sounds show Kimberly Peterson regular rate, and rhythm. Lungs: Clear to auscultation bilaterally Abdomen: Soft, nontender, nondistended  Neurological: Alert and oriented 3. Moves all extremities 4. Cranial nerves II through XII grossly intact. Skin: Warm and dry. No rashes or lesions. Extremities: bilateral LE edema     Data Reviewed: I have personally reviewed following labs and imaging studies  CBC: Recent Labs  Lab 09/14/19 2133 09/15/19 0450 09/16/19 0456  WBC 6.0 6.2 6.2  NEUTROABS  --  3.9  --   HGB 10.7* 9.9* 10.0*  HCT 37.0 34.4* 34.2*  MCV 81.9 81.7 81.4  PLT 236 231 384   Basic Metabolic Panel: Recent Labs  Lab 09/14/19 2133 09/15/19 0618 09/16/19 0456  NA 139 143 142  K 3.7 3.3* 4.3  CL 101 102 103  CO2 _0 GLUCOSE 172* 64* 162*  BUN 31* 30* 28*  CREATININE 1.52* 1.32* 1.43*  CALCIUM 8.5* 8.7* 8.9   GFR: Estimated Creatinine Clearance: 28.8 mL/min (Kimberly Peterson) (by C-G formula based on SCr of 1.43 mg/dL (H)). Liver Function Tests: Recent Labs  Lab 09/14/19 2133 09/15/19 0618  AST 65* 52*  ALT 28 27  ALKPHOS 100 93  BILITOT 0.7 0.7  PROT 7.6 7.4  ALBUMIN 3.5 3.3*   No results for input(s): LIPASE, AMYLASE in the last 168 hours. No results for input(s): AMMONIA in the last 168 hours. Coagulation Profile: Recent Labs  Lab 09/12/19 1156 09/14/19 2133 09/15/19 1912 09/16/19 0456  INR 5.9* 2.5* 2.8* 2.6*   Cardiac Enzymes: No results for input(s): CKTOTAL, CKMB, CKMBINDEX, TROPONINI in the last 168 hours. BNP (last 3 results) No results for input(s): PROBNP in the last 8760 hours. HbA1C: Recent Labs     09/15/19 0450  HGBA1C 6.2*   CBG: Recent Labs  Lab 09/15/19 1156 09/15/19 1800 09/15/19 2049 09/16/19 0810 09/16/19 1145  GLUCAP 159* 80 195* 185* 242*   Lipid Profile: No results for input(s): CHOL, HDL, LDLCALC, TRIG, CHOLHDL, LDLDIRECT in the last 72 hours. Thyroid Function Tests: No results for input(s): TSH, T4TOTAL, FREET4, T3FREE, THYROIDAB in the last 72 hours. Anemia Panel: No results for input(s): VITAMINB12, FOLATE, FERRITIN, TIBC, IRON, RETICCTPCT in the last 72 hours. Sepsis Labs: No results for input(s): PROCALCITON, LATICACIDVEN in the last 168 hours.  Recent Results (from the past 240 hour(s))  SARS CORONAVIRUS 2 (TAT 6-24 HRS) Nasopharyngeal Nasopharyngeal Swab     Status: None   Collection  Time: 09/15/19 12:31 AM   Specimen: Nasopharyngeal Swab  Result Value Ref Range Status   SARS Coronavirus 2 NEGATIVE NEGATIVE Final    Comment: (NOTE) SARS-CoV-2 target nucleic acids are NOT DETECTED. The SARS-CoV-2 RNA is generally detectable in upper and lower respiratory specimens during the acute phase of infection. Negative results do not preclude SARS-CoV-2 infection, do not rule out co-infections with other pathogens, and should not be used as the sole basis for treatment or other patient management decisions. Negative results must be combined with clinical observations, patient history, and epidemiological information. The expected result is Negative. Fact Sheet for Patients: SugarRoll.be Fact Sheet for Healthcare Providers: https://www.woods-mathews.com/ This test is not yet approved or cleared by the [place] FDA and  has been authorized for detection and/or diagnosis of SARS-CoV-2 by FDA under an Emergency Use Authorization (EUA). This EUA will remain  in effect (meaning this test can be used) for the duration of the COVID-19 declaration under Section 56 4(b)(1) of the Act, 21 U.S.C. section 360bbb-3(b)(1),  unless the authorization is terminated or revoked sooner. Performed at [facility], [place] [address], [place], [place] [address]          Radiology Studies: DG Chest Port 1 View  Result Date: 09/16/2019 CLINICAL DATA:  Hypoxia. EXAM: PORTABLE CHEST 1 VIEW COMPARISON:  09/14/2019 FINDINGS: 0948 hours. Low lung volumes with rightward patient rotation. The cardio pericardial silhouette is enlarged. There is pulmonary vascular congestion without overt pulmonary edema. Right base collapse/consolidation with small right pleural effusion is similar to prior given differential positioning. Left permanent pacemaker evident. Telemetry leads overlie the chest. IMPRESSION: No substantial change. Low volume film with vascular congestion and right base collapse/consolidation with small right pleural effusion. Electronically Signed   By: Misty Stanley M.D.   On: 09/16/2019 10:00   DG Chest Port 1 View  Result Date: 09/14/2019 CLINICAL DATA:  Shortness of breath EXAM: PORTABLE CHEST 1 VIEW COMPARISON:  07/11/2019 FINDINGS: Cardiac shadow is enlarged. Postsurgical changes and pacing device are again seen. Persistent vascular congestion is noted but mildly improved when compared with prior exam. Mild interstitial edema is seen. No bony abnormality is noted. IMPRESSION: Changes of mild CHF slightly improved when compared with the prior exam. Electronically Signed   By: Inez Catalina M.D.   On: 09/14/2019 21:43        Scheduled Meds: . aspirin EC  81 mg Oral Daily  . atorvastatin  10 mg Oral Daily  . carvedilol  6.25 mg Oral BID WC  . diltiazem  240 mg Oral Daily  . furosemide  40 mg Intravenous BID  . hydrALAZINE  37.5 mg Oral TID  . insulin aspart  0-9 Units Subcutaneous TID PC & HS  . insulin aspart protamine- aspart  7 Units Subcutaneous BID WC  . ipratropium-albuterol  3 mL Nebulization TID  . pantoprazole  40 mg Oral Daily  . sodium chloride flush  3 mL Intravenous Q12H  . warfarin  5 mg  Oral ONCE-1800  . Warfarin - Pharmacist Dosing Inpatient   Does not apply q1800   Continuous Infusions: . sodium chloride       LOS: 1 day    Time spent: over 30 min    Fayrene Helper, MD [facility]   To contact the attending provider between 7A-7P or the covering provider during after hours 7P-7A, please log into the web site [email or web address] and access using universal [place] password for that web site. If you do  not have the password, please call the hospital operator.  09/16/2019, 11:55 AM

## 2019-09-16 NOTE — Evaluation (Signed)
Physical Therapy Evaluation Patient Details Name: Kimberly Peterson MRN: [id number] DOB: 1930-01-07 Today's Date: 09/16/2019   History of Present Illness  86 year old female admitted with SOB. Acute on chronic diastolic heart failure. PMH includes: COPD, PE, HTN, HLD, Afib, DM.  Clinical Impression  Pt pleasant and gladly willing to participate with PT.  She needed only very light assist with bed mobility and transfers, she was also able to ambulate with good safety and confidence but was quick to fatigue and did have a drop in O2 on 4L (uses 2L at baseline).  She reports feeling good about being able to manage at home once she is breathing better, will benefit from HHPT medically cleared for d/c.     Follow Up Recommendations Home health PT;Supervision for mobility/OOB    Equipment Recommendations  3in1 (PT)    Recommendations for Other Services       Precautions / Restrictions Precautions Precautions: Fall Restrictions Weight Bearing Restrictions: No      Mobility  Bed Mobility Overal bed mobility: Needs Assistance Bed Mobility: Supine to Sit     Supine to sit: Min guard;Min assist     General bed mobility comments: Pt initially reaching for PT, apparently for heavy assist.  PT encouraged her to do more of it on her own and she ultimately needed only very light assist to get fully to EOB  Transfers Overall transfer level: Needs assistance Equipment used: Rolling walker (2 wheeled) Transfers: Sit to/from Stand Sit to Stand: Min assist         General transfer comment: Again pt does vast majority of the transition, needed only light assist to insure hips stayed forward while rising  Ambulation/Gait Ambulation/Gait assistance: Min guard Gait Distance (Feet): 17 Feet Assistive device: Rolling walker (2 wheeled)       General Gait Details: Pt is able to ambulate in the room relatively well with walker, on 4L sats dropped to mid 80s.  She fatigued with the effort and  reports that normally she is able to walk much more but did not show any overt safety or balance issues.     Stairs            Wheelchair Mobility    Modified Rankin (Stroke Patients Only)       Balance Overall balance assessment: Modified Independent                                           Pertinent Vitals/Pain Pain Assessment: No/denies pain    Home Living Family/patient expects to be discharged to:: Private residence Living Arrangements: Children Available Help at Discharge: Family;Available 24 hours/day Type of Home: House Home Access: Level entry(previous notes say 1 step)     Home Layout: One level Home Equipment: Walker - 4 wheels;Shower seat - built in;Cane - single point;Toilet riser      Prior Function Level of Independence: Independent with assistive device(s)         Comments: Home O2 (2L 24/7), reports daughter lives with her and assists when needed. If she is feeling good she does all bathing and dressing.     Hand Dominance        Extremity/Trunk Assessment   Upper Extremity Assessment Upper Extremity Assessment: Overall WFL for tasks assessed;Generalized weakness(age appropraite limitations)    Lower Extremity Assessment Lower Extremity Assessment: Overall WFL for tasks assessed;Generalized weakness(age appropriate  limitations)       Communication   Communication: No difficulties  Cognition Arousal/Alertness: Awake/alert Behavior During Therapy: WFL for tasks assessed/performed Overall Cognitive Status: Within Functional Limits for tasks assessed                                        General Comments      Exercises     Assessment/Plan    PT Assessment Patient needs continued PT services  PT Problem List Decreased strength;Decreased range of motion;Decreased activity tolerance;Decreased balance;Decreased mobility;Decreased knowledge of use of DME;Decreased safety awareness;Cardiopulmonary  status limiting activity       PT Treatment Interventions DME instruction;Gait training;Stair training;Functional mobility training;Therapeutic activities;Therapeutic exercise;Balance training;Neuromuscular re-education;Patient/family education    PT Goals (Current goals can be found in the Care Plan section)  Acute Rehab PT Goals Patient Stated Goal: go home PT Goal Formulation: With patient Time For Goal Achievement: 09/30/19 Potential to Achieve Goals: Fair    Frequency Min 2X/week   Barriers to discharge        Co-evaluation               AM-PAC PT "6 Clicks" Mobility  Outcome Measure Help needed turning from your back to your side while in a flat bed without using bedrails?: A Little Help needed moving from lying on your back to sitting on the side of a flat bed without using bedrails?: A Little Help needed moving to and from a bed to a chair (including a wheelchair)?: A Little Help needed standing up from a chair using your arms (e.g., wheelchair or bedside chair)?: A Little Help needed to walk in hospital room?: A Little Help needed climbing 3-5 steps with a railing? : A Lot 6 Click Score: 17    End of Session Equipment Utilized During Treatment: Gait belt;Oxygen(4L (uses 2L at baseline)) Activity Tolerance: Patient limited by fatigue Patient left: with call bell/phone within reach Nurse Communication: Mobility status PT Visit Diagnosis: Muscle weakness (generalized) (M62.81);Difficulty in walking, not elsewhere classified (R26.2)    Time: [id number] PT Time Calculation (min) (ACUTE ONLY): 28 min   Charges:   PT Evaluation $PT Eval Low Complexity: 1 Low PT Treatments $Gait Training: 8-22 mins        Kreg Shropshire, DPT 09/16/2019, 4:40 PM

## 2019-09-16 NOTE — TOC Initial Note (Signed)
Transition of Care [facility]) - Initial/Assessment Note    Patient Details  Name: Kimberly Peterson MRN: [id number] Date of Birth: 1929-09-05  [facility]) CM/SW Contact:    Victorino Dike, RN Phone Number: 09/16/2019, 12:04 PM  Clinical Narrative:                  Met with patient in room.  She reports her daughters live with her.  She reports weighing herself daily and checking her blood pressure.  She receives [facility] and will need a resumption order at discharge.  She uses a walker at home to ambulate and has setup for chronic oxygen at home.  Will continue to follow for discharge needs.   Expected Discharge Plan: [place] Barriers to Discharge: Continued Medical Work up   Patient Goals and CMS Choice Patient states their goals for this hospitalization and ongoing recovery are:: "to go home with daughters" CMS Medicare.gov Compare Post Acute Care list provided to:: Patient Choice offered to / list presented to : Patient  Expected Discharge Plan and Services Expected Discharge Plan: [place] In-house Referral: Clinical Social Work Discharge Planning Services: CM Consult Post Acute Care Choice: [place] arrangements for the past 2 months: [place]                   DME Agency: NA         HH Agency: [facility] ([facility] [employer]) Date [place]: 09/16/19 Time [place]: 1203 Representative spoke with at [place]: [place]  Prior Living Arrangements/Services Living arrangements for the past 2 months: [place] with:: Adult Children, Self Patient language and need for interpreter reviewed:: Yes Do you feel safe going back to the place where you live?: Yes      Need for Family Participation in Patient Care: No (Comment) Care giver support system in place?: Yes (comment) Current home services: DME, Home PT, Homehealth aide((RW,  Toilet riser, oxygen)) Criminal Activity/Legal Involvement Pertinent to Current Situation/Hospitalization: No - Comment as needed  Activities of Daily Living Home Assistive Devices/Equipment: CPAP, Walker (specify type), Oxygen ADL Screening (condition at time of admission) Patient's cognitive ability adequate to safely complete daily activities?: Yes Is the patient deaf or have difficulty hearing?: No Does the patient have difficulty seeing, even when wearing glasses/contacts?: No Does the patient have difficulty concentrating, remembering, or making decisions?: No Patient able to express need for assistance with ADLs?: Yes Does the patient have difficulty dressing or bathing?: No Independently performs ADLs?: Yes (appropriate for developmental age) Does the patient have difficulty walking or climbing stairs?: No Weakness of Legs: Both Weakness of Arms/Hands: None  Permission Sought/Granted                  Emotional Assessment Appearance:: Appears stated age Attitude/Demeanor/Rapport: Engaged Affect (typically observed): Accepting Orientation: : Oriented to Self, Oriented to Place, Oriented to  Time, Oriented to Situation Alcohol / Substance Use: Not Applicable Psych Involvement: No (comment)  Admission diagnosis:  Acute CHF (congestive heart failure) (HCC) [I50.9] Acute on chronic diastolic CHF (congestive heart failure) (HCC) [I50.33] Acute on chronic congestive heart failure, unspecified heart failure type ([place]) [I50.9] Heart failure ([place]) [I50.9] Patient Active Problem List   Diagnosis Date Noted  . Acute CHF (congestive heart failure) ([place]) 09/15/2019  . Acute on chronic diastolic CHF (congestive heart failure) ([place]) 09/15/2019  .  Heart failure ([place]) 09/15/2019  . Dyspnea on exertion 09/14/2019  . Encounter for general adult medical examination with abnormal findings 07/25/2019  . Long-term (current) use of anticoagulants, INR goal 2.0-3.0 07/25/2019  . Hospital  discharge follow-up 07/25/2019  . Iron deficiency anemia   . Type 2 diabetes, uncontrolled, with renal manifestation ([place]) 07/11/2019  . Microcytic anemia 07/11/2019  . Lactic acidosis 07/11/2019  . Hypokalemia 07/11/2019  . Elevated troponin 07/11/2019  . Acute renal failure superimposed on stage 3a chronic kidney disease ([place]) 07/11/2019  . Acute on chronic diastolic heart failure ([place]) 04/27/2019  . Acute and chronic respiratory failure with hypoxia ([place]) 03/01/2019  . Acute respiratory failure with hypoxia ([place]) 02/07/2019  . Type 2 diabetes with peripheral circulatory disorder, controlled ([place]) 10/17/2018  . Complete heart block ([place]) 09/09/2018  . Severe aortic valve stenosis 09/09/2018  . Lymphedema 08/12/2018  . Uncontrolled type 2 diabetes mellitus with hyperglycemia ([place]) 02/25/2018  . Atrial fibrillation ([place]) 02/25/2018  . Encounter for current long-term use of anticoagulants 02/25/2018  . Dependence on supplemental oxygen 02/25/2018  . Lower extremity edema 02/25/2018  . CHF (congestive heart failure) ([place]) 12/04/2015  . Diabetes mellitus ([place]) 12/04/2015  . Chronic obstructive pulmonary disease ([place]) 09/20/2015  . Diastolic CHF, acute on chronic (HCC) 09/19/2015  . Benign essential HTN 09/19/2015  . Controlled diabetes mellitus without complication, with long-term current use of insulin ([place]) 09/19/2015  . Hyperlipidemia, mixed 09/04/2014  . Pulmonary embolism ([place]) 09/23/2013   PCP:  [name], MD Pharmacy:   [facility] [address] [place] - [place] [address] [place] [address] Phone: [phone] Fax: [place], [place] - [address] [facility]. [address] [place] [address] Phone: [phone] Fax: [place] #[phone] [place], [place] - [place] AT [place] [address] [place] [address] Phone: [phone]  Fax: [phone]     Social Determinants of Health (SDOH) Interventions    Readmission Risk Interventions Readmission Risk Prevention Plan 07/13/2019 03/05/2019 03/03/2019  Transportation Screening Complete Complete Complete  PCP or Specialist Appt within 3-5 Days - Complete -  HRI or [place] - Complete Complete  Social Work Consult for [place] Planning/Counseling - Complete -  Palliative Care Screening - Not Applicable Complete  Medication Review (RN Care Manager) Referral to Pharmacy Complete Complete  HRI or Home Care Consult Complete - -  Palliative Care Screening Complete - -  [place] Not Complete - -  Some recent data might be hidden

## 2019-09-17 ENCOUNTER — Inpatient Hospital Stay: Payer: Medicare Other

## 2019-09-17 LAB — COMPREHENSIVE METABOLIC PANEL
ALT: 21 U/L (ref 0–44)
AST: 30 U/L (ref 15–41)
Albumin: 3.5 g/dL (ref 3.5–5.0)
Alkaline Phosphatase: 76 U/L (ref 38–126)
Anion gap: 10 (ref 5–15)
BUN: 32 mg/dL — ABNORMAL HIGH (ref 8–23)
CO2: 30 mmol/L (ref 22–32)
Calcium: 8.7 mg/dL — ABNORMAL LOW (ref 8.9–10.3)
Chloride: 101 mmol/L (ref 98–111)
Creatinine, Ser: 1.41 mg/dL — ABNORMAL HIGH (ref 0.44–1.00)
GFR calc Af Amer: 39 mL/min — ABNORMAL LOW (ref >60)
GFR calc non Af Amer: 33 mL/min — ABNORMAL LOW (ref >60)
Glucose, Bld: 186 mg/dL — ABNORMAL HIGH (ref 70–99)
Potassium: 3.8 mmol/L (ref 3.5–5.1)
Sodium: 141 mmol/L (ref 135–145)
Total Bilirubin: 0.8 mg/dL (ref 0.3–1.2)
Total Protein: 7.5 g/dL (ref 6.5–8.1)

## 2019-09-17 LAB — CBC WITH DIFFERENTIAL/PLATELET
Abs Immature Granulocytes: 0.02 10*3/uL (ref 0.00–0.07)
Basophils Absolute: 0.1 10*3/uL (ref 0.0–0.1)
Basophils Relative: 1 %
Eosinophils Absolute: 0.2 10*3/uL (ref 0.0–0.5)
Eosinophils Relative: 3 %
HCT: 34.4 % — ABNORMAL LOW (ref 36.0–46.0)
Hemoglobin: 9.9 g/dL — ABNORMAL LOW (ref 12.0–15.0)
Immature Granulocytes: 0 %
Lymphocytes Relative: 17 %
Lymphs Abs: 1 10*3/uL (ref 0.7–4.0)
MCH: 23.9 pg — ABNORMAL LOW (ref 26.0–34.0)
MCHC: 28.8 g/dL — ABNORMAL LOW (ref 30.0–36.0)
MCV: 83.1 fL (ref 80.0–100.0)
Monocytes Absolute: 0.7 10*3/uL (ref 0.1–1.0)
Monocytes Relative: 12 %
Neutro Abs: 4 10*3/uL (ref 1.7–7.7)
Neutrophils Relative %: 67 %
Platelets: 211 10*3/uL (ref 150–400)
RBC: 4.14 MIL/uL (ref 3.87–5.11)
RDW: 19.4 % — ABNORMAL HIGH (ref 11.5–15.5)
WBC: 5.9 10*3/uL (ref 4.0–10.5)
nRBC: 0 % (ref 0.0–0.2)

## 2019-09-17 LAB — BRAIN NATRIURETIC PEPTIDE: B Natriuretic Peptide: 904 pg/mL — ABNORMAL HIGH (ref 0.0–100.0)

## 2019-09-17 LAB — GLUCOSE, CAPILLARY
Glucose-Capillary: 173 mg/dL — ABNORMAL HIGH (ref 70–99)
Glucose-Capillary: 258 mg/dL — ABNORMAL HIGH (ref 70–99)
Glucose-Capillary: 166 mg/dL — ABNORMAL HIGH (ref 70–99)
Glucose-Capillary: 174 mg/dL — ABNORMAL HIGH (ref 70–99)

## 2019-09-17 LAB — PHOSPHORUS: Phosphorus: 4.1 mg/dL (ref 2.5–4.6)

## 2019-09-17 LAB — MAGNESIUM: Magnesium: 2.4 mg/dL (ref 1.7–2.4)

## 2019-09-17 LAB — PROTIME-INR
INR: 2.8 — ABNORMAL HIGH (ref 0.8–1.2)
Prothrombin Time: 29.2 seconds — ABNORMAL HIGH (ref 11.4–15.2)

## 2019-09-17 MED ORDER — WARFARIN SODIUM 5 MG PO TABS
5.0000 mg | ORAL_TABLET | Freq: Every day | ORAL | Status: AC
  Administered 2019-09-17: 18:00:00 5 mg via ORAL
  Filled 2019-09-17: qty 1

## 2019-09-17 NOTE — Consult Note (Signed)
[place] for Warfarin Indication: atrial fibrillation  No Known Allergies  Patient Measurements: Height: 5\' 2"  (157.5 cm) Weight: 197 lb 9.6 oz (89.6 kg) IBW/kg (Calculated) : 50.1   Vital Signs: Temp: 98.2 F (36.8 C) (03/13 0814) Temp Source: Oral (03/13 0329) BP: 155/66 (03/13 0814) Pulse Rate: 64 (03/13 0814)  Labs: Recent Labs    09/14/19 2133 09/14/19 2133 09/15/19 0450 09/15/19 0450 09/15/19 0618 09/15/19 1912 09/16/19 0456 09/17/19 0636  HGB 10.7*   < > 9.9*   < >  --   --  10.0* 9.9*  HCT 37.0   < > 34.4*  --   --   --  34.2* 34.4*  PLT 236   < > 231  --   --   --  231 211  LABPROT 26.8*   < >  --   --   --  29.5* 27.9* 29.2*  INR 2.5*   < >  --   --   --  2.8* 2.6* 2.8*  CREATININE 1.52*   < >  --   --  1.32*  --  1.43* 1.41*  TROPONINIHS 49*  --  60*  --  62*  --   --   --    < > = values in this interval not displayed.    Estimated Creatinine Clearance: 29.2 mL/min (A) (by C-G formula based on SCr of 1.41 mg/dL (H)).   Medications:  Warfarin 7.5 mg Thursday and 5 mg all other days- confirmed with patient dose and last dose was 3/10 (evening)  Assessment: Kimberly Peterson  is a 86 y.o. female with a known history of Afib (on warfarin), CHF, CKD, COPD, T2DM, HTN who presented to the ED with acute onset of worsening dyspnea for about a week. Pharmacy consulted to manage warfarin inpatient.   Date  INR  Dose  3/10  2.5  5 mg (patient reported) 3/11  2.8  7.5 mg 3/12  2.6  5 mg 3/13  2.8  Today's INR: Therapeutic  H&H, platelets stable   Goal of Therapy:  INR 2-3 Monitor platelets by anticoagulation protocol: Yes   Plan:  - continue Warfarin 5 mg tonight.  - Follow INR tomorrow morning - CBC every 3 days per protocol   Kimberly Peterson A 09/17/2019,10:06 AM

## 2019-09-17 NOTE — Progress Notes (Addendum)
PROGRESS NOTE    Kimberly Peterson  DXA:[id number] DOB: 03-18-1930 DOA: 09/14/2019 PCP: [name], MD   Brief Narrative:  Kimberly Peterson  is Kimberly Peterson 86 y.o. female with Kimberly Peterson known history of CHF, chronic kidney disease, COPD, type 2 diabetes mellitus and hypertension, presented to the emergency room with acute onset of worsening dyspnea for about Amori Cooperman week with associated increased oxygen requirement above her baseline of 2 L/min with chronic respiratory failure.  She stated that [name] is not getting her enough diuresis.  She denied any cough or wheezing.  No fever or chills. No nausea or vomiting or abdominal pain.  No dysuria, oliguria or hematuria or flank pain.  Upon presentation to the emergency room, respiratory rate was 27 and pulse ox 97% on 4 L of O2 by nasal cannula after being initially placed on nonrebreather by EMS.  Respiratory rate was later 33.  CBC showed  hemoglobin of 10.7 hematocrit 37 better than previous levels.  BNP was 603.  CMP was remarkable for Farran Amsden BUN of 31 and creatinine 1.52 compared to 18/1.1 on 07/16/2019 with GFR of 35 compared to 52 then.  For which x-ray showed mild CHF looking better than previous chest x-ray.  EKG showed normal sinus rhythm with rate of 65 with nonspecific intraventricular conduction delay.  The patient was given 40 mg of IV Lasix.  She continued to have dyspnea and increased oxygen requirement.  She will be admitted to an observation telemetry bed for further evaluation and management.  Assessment & Plan:   Active Problems:   Acute CHF (congestive heart failure) (HCC)   Acute on chronic diastolic CHF (congestive heart failure) ([place])   Heart failure ([place])   1.  Acute on chronic diastolic CHF with subsequent acute on chronic hypoxic respiratory failure. - Increase lasix 80 IV BID (on this previous admission) (on torsemide 40 mg daily at home, may need increased dose?) - Troponins elevated, but flat (similar to prior values), not suggestive of ACS - EKG  appears similar to priors - echo from 07/12/19 with EF 60-65%, no RWMA -will need outpatient cardiology follow up, consider cardiology c/s here as needed if difficulty with diuresis -We will continue Coreg and hydralazine. - uses 2 L at home, now on 4 L - wean as tolerated - I/O, daily weights - 89.6 on 3/13 (was 84.2 kg on 1/9).  Net negative 3.7 L.   - CXR 3/12 without substantial change, low volume film with vascular congestion and R base collapse/consolidation with small R pleural effusion - CXR 3/13 with R> L effusions with associated atelectasis vs pneumonia  (low suspicon for pneumonia without WBC count or fever, continue to monitor)   Wt Readings from Last 3 Encounters:  09/17/19 89.6 kg  09/09/19 83.3 kg  08/23/19 83.4 kg   2.  Coagulopathy secondary to Coumadin, for history of atrial fibrillation.  Patient is status post aortic valve replacement.  S/p Pacemaker placement. -warfarin per pharmacy  3.  Acute kidney injury superimposed on stage IIIb chronic kidney disease. - baseline creatinine 1.10, 1.52 at presentation, likely 2/2 HF - creatinine 1.41 today, fluctuating, follow with diuresis  4.  Hypertension. -We will continue Coreg, hydralazine and Cartia XT.  5.  Type II diabetes mellitus  Hypoglycemia - Continue 70/30 and SSI - adjust as needed  6.  GERD. -PPI therapy will be resumed.  7.  Dyslipidemia. -We will continue statin therapy.  OSA: cpap  DVT prophylaxis: warfarin Code Status: DNR Family Communication: none  at bedside - daughter 3/13 Disposition Plan:  . Patient came from: home            . Anticipated d/c place: home . Barriers to d/c OR conditions which need to be met to effect [place] safe d/c: pending further improvement in SOB, IV diuresis Consultants:   none  Procedures:   none  Antimicrobials:  Anti-infectives (From admission, onward)   None     Subjective: Feeling gradually better Not to baseline  Objective: Vitals:   09/17/19  0814 09/17/19 1033 09/17/19 1147 09/17/19 1820  BP: (!) 155/66 (!) 148/72 (!) 150/79 (!) 155/80  Pulse: 64  65   Resp: 18  20   Temp: 98.2 F (36.8 C)     TempSrc:      SpO2: 99%  91%   Weight:      Height:        Intake/Output Summary (Last 24 hours) at 09/17/2019 1921 Last data filed at 09/17/2019 1846 Gross per 24 hour  Intake 960 ml  Output 1450 ml  Net -490 ml   Filed Weights   09/16/19 0523 09/16/19 1006 09/17/19 0329  Weight: 91.1 kg 89.4 kg 89.6 kg    Examination:  General: No acute distress. Cardiovascular: Heart sounds show Darienne Belleau regular rate, and rhythm Lungs: Clear to auscultation bilaterally  Abdomen: Soft, nontender, nondistended Neurological: Alert and oriented 3. Moves all extremities 4. Cranial nerves II through XII grossly intact. Skin: Warm and dry. No rashes or lesions. Extremities: bilateral LE edema, R>L (chronic)      Data Reviewed: I have personally reviewed following labs and imaging studies  CBC: Recent Labs  Lab 09/14/19 2133 09/15/19 0450 09/16/19 0456 09/17/19 0636  WBC 6.0 6.2 6.2 5.9  NEUTROABS  --  3.9  --  4.0  HGB 10.7* 9.9* 10.0* 9.9*  HCT 37.0 34.4* 34.2* 34.4*  MCV 81.9 81.7 81.4 83.1  PLT 236 231 231 600   Basic Metabolic Panel: Recent Labs  Lab 09/14/19 2133 09/15/19 0618 09/16/19 0456 09/17/19 0636  NA 139 143 142 141  K 3.7 3.3* 4.3 3.8  CL 101 102 103 101  CO2 _0 GLUCOSE 172* 64* 162* 186*  BUN 31* 30* 28* 32*  CREATININE 1.52* 1.32* 1.43* 1.41*  CALCIUM 8.5* 8.7* 8.9 8.7*  MG  --   --   --  2.4  PHOS  --   --   --  4.1   GFR: Estimated Creatinine Clearance: 29.2 mL/min (Veena Sturgess) (by C-G formula based on SCr of 1.41 mg/dL (H)). Liver Function Tests: Recent Labs  Lab 09/14/19 2133 09/15/19 0618 09/17/19 0636  AST 65* 52* 30  ALT _1 ALKPHOS 100 93 76  BILITOT 0.7 0.7 0.8  PROT 7.6 7.4 7.5  ALBUMIN 3.5 3.3* 3.5   No results for input(s): LIPASE, AMYLASE in the last 168 hours. No  results for input(s): AMMONIA in the last 168 hours. Coagulation Profile: Recent Labs  Lab 09/12/19 1156 09/14/19 2133 09/15/19 1912 09/16/19 0456 09/17/19 0636  INR 5.9* 2.5* 2.8* 2.6* 2.8*   Cardiac Enzymes: No results for input(s): CKTOTAL, CKMB, CKMBINDEX, TROPONINI in the last 168 hours. BNP (last 3 results) No results for input(s): PROBNP in the last 8760 hours. HbA1C: Recent Labs    09/15/19 0450  HGBA1C 6.2*   CBG: Recent Labs  Lab 09/16/19 1706 09/16/19 2136 09/17/19 1021 09/17/19 1148 09/17/19 1809  GLUCAP 200* 280* 173* 258* 166*   Lipid Profile:  No results for input(s): CHOL, HDL, LDLCALC, TRIG, CHOLHDL, LDLDIRECT in the last 72 hours. Thyroid Function Tests: No results for input(s): TSH, T4TOTAL, FREET4, T3FREE, THYROIDAB in the last 72 hours. Anemia Panel: No results for input(s): VITAMINB12, FOLATE, FERRITIN, TIBC, IRON, RETICCTPCT in the last 72 hours. Sepsis Labs: No results for input(s): PROCALCITON, LATICACIDVEN in the last 168 hours.  Recent Results (from the past 240 hour(s))  SARS CORONAVIRUS 2 (TAT 6-24 HRS) Nasopharyngeal Nasopharyngeal Swab     Status: None   Collection Time: 09/15/19 12:31 AM   Specimen: Nasopharyngeal Swab  Result Value Ref Range Status   SARS Coronavirus 2 NEGATIVE NEGATIVE Final    Comment: (NOTE) SARS-CoV-2 target nucleic acids are NOT DETECTED. The SARS-CoV-2 RNA is generally detectable in upper and lower respiratory specimens during the acute phase of infection. Negative results do not preclude SARS-CoV-2 infection, do not rule out co-infections with other pathogens, and should not be used as the sole basis for treatment or other patient management decisions. Negative results must be combined with clinical observations, patient history, and epidemiological information. The expected result is Negative. Fact Sheet for Patients: SugarRoll.be Fact Sheet for Healthcare  Providers: https://www.woods-mathews.com/ This test is not yet approved or cleared by the [place] FDA and  has been authorized for detection and/or diagnosis of SARS-CoV-2 by FDA under an Emergency Use Authorization (EUA). This EUA will remain  in effect (meaning this test can be used) for the duration of the COVID-19 declaration under Section 56 4(b)(1) of the Act, 21 U.S.C. section 360bbb-3(b)(1), unless the authorization is terminated or revoked sooner. Performed at [facility], [place] [address], [place], [place] [address]          Radiology Studies: DG Chest Port 1 View  Result Date: 09/17/2019 CLINICAL DATA:  Hypoxia per physician order. Pt admitted 09/14/2019 for acute CHF and acute on chronic diastolic CH. Hx - CHF, COPD, DM, HTN, aortic valve replacement, pacemaker. EXAM: PORTABLE CHEST 1 VIEW COMPARISON:  09/16/2019 FINDINGS: Persistent lung base opacities, greater on the right, consistent with pleural effusions with atelectasis and/or pneumonia. Persistent vascular congestion without overt edema. Stable mild cardiomegaly and changes from prior cardiac surgery. No pneumothorax. Left anterior chest wall pacemaker is stable. IMPRESSION: 1. No significant change from the previous day's study. 2. Small, right greater than left, pleural effusions with associated atelectasis and/or pneumonia. Electronically Signed   By: Lajean Manes M.D.   On: 09/17/2019 09:23   DG Chest Port 1 View  Result Date: 09/16/2019 CLINICAL DATA:  Hypoxia. EXAM: PORTABLE CHEST 1 VIEW COMPARISON:  09/14/2019 FINDINGS: 0948 hours. Low lung volumes with rightward patient rotation. The cardio pericardial silhouette is enlarged. There is pulmonary vascular congestion without overt pulmonary edema. Right base collapse/consolidation with small right pleural effusion is similar to prior given differential positioning. Left permanent pacemaker evident. Telemetry leads overlie the chest. IMPRESSION:  No substantial change. Low volume film with vascular congestion and right base collapse/consolidation with small right pleural effusion. Electronically Signed   By: Misty Stanley M.D.   On: 09/16/2019 10:00        Scheduled Meds: . aspirin EC  81 mg Oral Daily  . atorvastatin  10 mg Oral Daily  . carvedilol  6.25 mg Oral BID WC  . diltiazem  240 mg Oral Daily  . furosemide  80 mg Intravenous BID  . hydrALAZINE  37.5 mg Oral TID  . insulin aspart  0-9 Units Subcutaneous TID PC & HS  .  insulin aspart protamine- aspart  7 Units Subcutaneous BID WC  . ipratropium-albuterol  3 mL Nebulization TID  . pantoprazole  40 mg Oral Daily  . sodium chloride flush  3 mL Intravenous Q12H  . Warfarin - Pharmacist Dosing Inpatient   Does not apply q1800   Continuous Infusions: . sodium chloride       LOS: 2 days    Time spent: over 30 min    Fayrene Helper, MD [facility]   To contact the attending provider between 7A-7P or the covering provider during after hours 7P-7A, please log into the web site [email or web address] and access using universal [place] password for that web site. If you do not have the password, please call the hospital operator.  09/17/2019, 7:21 PM

## 2019-09-18 ENCOUNTER — Inpatient Hospital Stay: Payer: Medicare Other

## 2019-09-18 ENCOUNTER — Encounter: Payer: Self-pay | Admitting: Family Medicine

## 2019-09-18 LAB — COMPREHENSIVE METABOLIC PANEL
ALT: 20 U/L (ref 0–44)
AST: 25 U/L (ref 15–41)
Albumin: 3.5 g/dL (ref 3.5–5.0)
Alkaline Phosphatase: 81 U/L (ref 38–126)
Anion gap: 6 (ref 5–15)
BUN: 30 mg/dL — ABNORMAL HIGH (ref 8–23)
CO2: 35 mmol/L — ABNORMAL HIGH (ref 22–32)
Calcium: 9 mg/dL (ref 8.9–10.3)
Chloride: 101 mmol/L (ref 98–111)
Creatinine, Ser: 1.19 mg/dL — ABNORMAL HIGH (ref 0.44–1.00)
GFR calc Af Amer: 48 mL/min — ABNORMAL LOW (ref >60)
GFR calc non Af Amer: 41 mL/min — ABNORMAL LOW (ref >60)
Glucose, Bld: 150 mg/dL — ABNORMAL HIGH (ref 70–99)
Potassium: 3.7 mmol/L (ref 3.5–5.1)
Sodium: 142 mmol/L (ref 135–145)
Total Bilirubin: 0.8 mg/dL (ref 0.3–1.2)
Total Protein: 7.6 g/dL (ref 6.5–8.1)

## 2019-09-18 LAB — CBC WITH DIFFERENTIAL/PLATELET
Abs Immature Granulocytes: 0.02 10*3/uL (ref 0.00–0.07)
Basophils Absolute: 0 10*3/uL (ref 0.0–0.1)
Basophils Relative: 1 %
Eosinophils Absolute: 0.2 10*3/uL (ref 0.0–0.5)
Eosinophils Relative: 4 %
HCT: 36.8 % (ref 36.0–46.0)
Hemoglobin: 10.7 g/dL — ABNORMAL LOW (ref 12.0–15.0)
Immature Granulocytes: 0 %
Lymphocytes Relative: 19 %
Lymphs Abs: 1.2 10*3/uL (ref 0.7–4.0)
MCH: 23.9 pg — ABNORMAL LOW (ref 26.0–34.0)
MCHC: 29.1 g/dL — ABNORMAL LOW (ref 30.0–36.0)
MCV: 82.3 fL (ref 80.0–100.0)
Monocytes Absolute: 0.7 10*3/uL (ref 0.1–1.0)
Monocytes Relative: 11 %
Neutro Abs: 4.1 10*3/uL (ref 1.7–7.7)
Neutrophils Relative %: 65 %
Platelets: 225 10*3/uL (ref 150–400)
RBC: 4.47 MIL/uL (ref 3.87–5.11)
RDW: 19.3 % — ABNORMAL HIGH (ref 11.5–15.5)
WBC: 6.3 10*3/uL (ref 4.0–10.5)
nRBC: 0 % (ref 0.0–0.2)

## 2019-09-18 LAB — BRAIN NATRIURETIC PEPTIDE: B Natriuretic Peptide: 722 pg/mL — ABNORMAL HIGH (ref 0.0–100.0)

## 2019-09-18 LAB — GLUCOSE, CAPILLARY
Glucose-Capillary: 159 mg/dL — ABNORMAL HIGH (ref 70–99)
Glucose-Capillary: 190 mg/dL — ABNORMAL HIGH (ref 70–99)
Glucose-Capillary: 230 mg/dL — ABNORMAL HIGH (ref 70–99)
Glucose-Capillary: 223 mg/dL — ABNORMAL HIGH (ref 70–99)

## 2019-09-18 LAB — PROTIME-INR
INR: 3.4 — ABNORMAL HIGH (ref 0.8–1.2)
Prothrombin Time: 34.4 seconds — ABNORMAL HIGH (ref 11.4–15.2)

## 2019-09-18 LAB — MAGNESIUM: Magnesium: 2.3 mg/dL (ref 1.7–2.4)

## 2019-09-18 LAB — PHOSPHORUS: Phosphorus: 3.7 mg/dL (ref 2.5–4.6)

## 2019-09-18 NOTE — Consult Note (Signed)
[place] for Warfarin Indication: atrial fibrillation  No Known Allergies  Patient Measurements: Height: 5\' 2"  (157.5 cm) Weight: 195 lb 13.4 oz (88.8 kg) IBW/kg (Calculated) : 50.1   Vital Signs: Temp: 98 F (36.7 C) (03/14 0742) Temp Source: Oral (03/14 0742) BP: 155/63 (03/14 0742) Pulse Rate: 65 (03/14 0742)  Labs: Recent Labs    09/16/19 0456 09/16/19 0456 09/17/19 0636 09/18/19 0543  HGB 10.0*   < > 9.9* 10.7*  HCT 34.2*  --  34.4* 36.8  PLT 231  --  211 225  LABPROT 27.9*  --  29.2* 34.4*  INR 2.6*  --  2.8* 3.4*  CREATININE 1.43*  --  1.41* 1.19*   < > = values in this interval not displayed.    Estimated Creatinine Clearance: 34.5 mL/min (A) (by C-G formula based on SCr of 1.19 mg/dL (H)).   Medications:  Warfarin 7.5 mg Thursday and 5 mg all other days- confirmed with patient dose and last dose was 3/10 (evening)  Assessment: Kimberly Peterson  is a 86 y.o. female with a known history of Afib (on warfarin), CHF, CKD, COPD, T2DM, HTN who presented to the ED with acute onset of worsening dyspnea for about a week. Pharmacy consulted to manage warfarin inpatient.   Date  INR  Dose  3/10  2.5  5 mg (patient reported) 3/11  2.8  7.5 mg 3/12  2.6  5 mg 3/13  2.8  5 mg 3/14  3.4  Today's INR: supratherapeutic  H&H, platelets stable   Goal of Therapy:  INR 2-3 Monitor platelets by anticoagulation protocol: Yes   Plan:  - hold warfarin tonight - Follow INR tomorrow morning - CBC every 3 days per protocol   [place] Resident 09/18/2019,11:12 AM

## 2019-09-18 NOTE — Progress Notes (Signed)
Patient has done well with hospital cpap. She was able to tell me last night that her pressure on her home unit was 5.  I initially started at 10 per protocol. However, patient stated it was affecting her breathing and asked to come off.  After talking with her, she was able to rely information. Setting changes made. Patient has tolerated changes well while sleeping in the chair.

## 2019-09-18 NOTE — Progress Notes (Addendum)
PROGRESS NOTE    Kimberly Peterson  TLX:[id number] DOB: 12/05/1929 DOA: 09/14/2019 PCP: [name], MD   Brief Narrative:  Kimberly Peterson  is Kimberly Peterson 86 y.o. female with Kimberly Peterson known history of CHF, chronic kidney disease, COPD, type 2 diabetes mellitus and hypertension, presented to the emergency room with acute onset of worsening dyspnea for about Kimberly Peterson week with associated increased oxygen requirement above her baseline of 2 L/min with chronic respiratory failure.  She stated that Kimberly Peterson is not getting her enough diuresis.  She denied any cough or wheezing.  No fever or chills. No nausea or vomiting or abdominal pain.  No dysuria, oliguria or hematuria or flank pain.  Upon presentation to the emergency room, respiratory rate was 27 and pulse ox 97% on 4 L of O2 by nasal cannula after being initially placed on nonrebreather by EMS.  Respiratory rate was later 33.  CBC showed  hemoglobin of 10.7 hematocrit 37 better than previous levels.  BNP was 603.  CMP was remarkable for Kimberly Peterson BUN of 31 and creatinine 1.52 compared to 18/1.1 on 07/16/2019 with GFR of 35 compared to 52 then.  For which x-ray showed mild CHF looking better than previous chest x-ray.  EKG showed normal sinus rhythm with rate of 65 with nonspecific intraventricular conduction delay.  The patient was given 40 mg of IV Lasix.  She continued to have dyspnea and increased oxygen requirement.  She will be admitted to an observation telemetry bed for further evaluation and management.  Assessment & Plan:   Active Problems:   Acute CHF (congestive heart failure) (HCC)   Acute on chronic diastolic CHF (congestive heart failure) ([place])   Heart failure ([place])   1.  Acute on chronic diastolic CHF with subsequent acute on chronic hypoxic respiratory failure. - Increase lasix 80 IV BID (on this previous admission) (on torsemide 40 mg daily at home, may need increased dose?) - Troponins elevated, but flat (similar to prior values), not suggestive of ACS - EKG  appears similar to priors - echo from 07/12/19 with EF 60-65%, no RWMA -will need outpatient cardiology follow up, consider cardiology c/s here as needed if difficulty with diuresis -We will continue Coreg and hydralazine. - uses 2 L at home, now on 4 L - wean as tolerated - I/O, daily weights - 88.8 on 3/14 (was 84.2 kg on 1/9).  Net negative 3.9 L.   - CXR 3/12 without substantial change, low volume film with vascular congestion and R base collapse/consolidation with small R pleural effusion - CXR 3/13 with R> L effusions with associated atelectasis vs pneumonia  (low suspicon for pneumonia without WBC count or fever, continue to monitor)  - CXR 3/14 without significant change, small effusions with atelectasis - LE [place] negative for DVT  Wt Readings from Last 3 Encounters:  09/18/19 88.8 kg  09/09/19 83.3 kg  08/23/19 83.4 kg   2.  Coagulopathy secondary to Coumadin, for history of atrial fibrillation.  Patient is status post aortic valve replacement.  S/p Pacemaker placement. -warfarin per pharmacy -> supratherapeutic today, on hold  3.  Acute kidney injury superimposed on stage IIIb chronic kidney disease. - baseline creatinine 1.10, 1.52 at presentation, likely 2/2 HF - creatinine 1.19 today, improving with diuresis  4.  Hypertension. -We will continue Coreg, hydralazine and Cartia XT.  5.  Type II diabetes mellitus  Hypoglycemia - Continue 70/30 and SSI - adjust as needed  6.  GERD. -PPI therapy will be resumed.  7.  Dyslipidemia. -We will continue statin therapy.  OSA: cpap  DVT prophylaxis: warfarin Code Status: DNR Family Communication: none at bedside - daughter 3/14 Disposition Plan:  . Patient came from: home            . Anticipated d/c place: home . Barriers to d/c OR conditions which need to be met to effect Kimberly Peterson safe d/c: pending further improvement in SOB, IV diuresis Consultants:   none  Procedures:   none  Antimicrobials:  Anti-infectives (From  admission, onward)   None     Subjective: No new complaints Not back to baseline, but Kimberly Peterson little better  Objective: Vitals:   09/18/19 0350 09/18/19 0742 09/18/19 1133 09/18/19 1605  BP: (!) 153/60 (!) 155/63 (!) 146/63 127/75  Pulse: 65 65 64 64  Resp: _0 Temp: 98.5 F (36.9 C) 98 F (36.7 C) (!) 97.5 F (36.4 C) (!) 97.5 F (36.4 C)  TempSrc:  Oral Oral Oral  SpO2: 95% 100% 96% 99%  Weight: 88.8 kg     Height:        Intake/Output Summary (Last 24 hours) at 09/18/2019 1610 Last data filed at 09/18/2019 1330 Gross per 24 hour  Intake 1300 ml  Output 1700 ml  Net -400 ml   Filed Weights   09/16/19 1006 09/17/19 0329 09/18/19 0350  Weight: 89.4 kg 89.6 kg 88.8 kg    Examination:  General: No acute distress. Cardiovascular: Heart sounds show Kimberly Peterson regular rate, and rhythm.  Lungs: Clear to auscultation bilaterally . Abdomen: Soft, nontender, nondistended  Neurological: Alert and oriented 3. Moves all extremities 4. Cranial nerves II through XII grossly intact. Skin: Warm and dry. No rashes or lesions. Extremities: bilateral LE edema, R>L       Data Reviewed: I have personally reviewed following labs and imaging studies  CBC: Recent Labs  Lab 09/14/19 2133 09/15/19 0450 09/16/19 0456 09/17/19 0636 09/18/19 0543  WBC 6.0 6.2 6.2 5.9 6.3  NEUTROABS  --  3.9  --  4.0 4.1  HGB 10.7* 9.9* 10.0* 9.9* 10.7*  HCT 37.0 34.4* 34.2* 34.4* 36.8  MCV 81.9 81.7 81.4 83.1 82.3  PLT 236 231 231 211 098   Basic Metabolic Panel: Recent Labs  Lab 09/14/19 2133 09/15/19 0618 09/16/19 0456 09/17/19 0636 09/18/19 0543  NA 139 143 142 141 142  K 3.7 3.3* 4.3 3.8 3.7  CL 101 102 103 101 101  CO2 _1 35*  GLUCOSE 172* 64* 162* 186* 150*  BUN 31* 30* 28* 32* 30*  CREATININE 1.52* 1.32* 1.43* 1.41* 1.19*  CALCIUM 8.5* 8.7* 8.9 8.7* 9.0  MG  --   --   --  2.4 2.3  PHOS  --   --   --  4.1 3.7   GFR: Estimated Creatinine Clearance: 34.5 mL/min (Kimberly Peterson)  (by C-G formula based on SCr of 1.19 mg/dL (H)). Liver Function Tests: Recent Labs  Lab 09/14/19 2133 09/15/19 0618 09/17/19 0636 09/18/19 0543  AST 65* 52* 30 25  ALT _2 ALKPHOS 100 93 76 81  BILITOT 0.7 0.7 0.8 0.8  PROT 7.6 7.4 7.5 7.6  ALBUMIN 3.5 3.3* 3.5 3.5   No results for input(s): LIPASE, AMYLASE in the last 168 hours. No results for input(s): AMMONIA in the last 168 hours. Coagulation Profile: Recent Labs  Lab 09/14/19 2133 09/15/19 1912 09/16/19 0456 09/17/19 0636 09/18/19 0543  INR 2.5* 2.8* 2.6* 2.8* 3.4*   Cardiac Enzymes: No  results for input(s): CKTOTAL, CKMB, CKMBINDEX, TROPONINI in the last 168 hours. BNP (last 3 results) No results for input(s): PROBNP in the last 8760 hours. HbA1C: No results for input(s): HGBA1C in the last 72 hours. CBG: Recent Labs  Lab 09/17/19 1809 09/17/19 2104 09/18/19 0810 09/18/19 1133 09/18/19 1605  GLUCAP 166* 174* 159* 190* 230*   Lipid Profile: No results for input(s): CHOL, HDL, LDLCALC, TRIG, CHOLHDL, LDLDIRECT in the last 72 hours. Thyroid Function Tests: No results for input(s): TSH, T4TOTAL, FREET4, T3FREE, THYROIDAB in the last 72 hours. Anemia Panel: No results for input(s): VITAMINB12, FOLATE, FERRITIN, TIBC, IRON, RETICCTPCT in the last 72 hours. Sepsis Labs: No results for input(s): PROCALCITON, LATICACIDVEN in the last 168 hours.  Recent Results (from the past 240 hour(s))  SARS CORONAVIRUS 2 (TAT 6-24 HRS) Nasopharyngeal Nasopharyngeal Swab     Status: None   Collection Time: 09/15/19 12:31 AM   Specimen: Nasopharyngeal Swab  Result Value Ref Range Status   SARS Coronavirus 2 NEGATIVE NEGATIVE Final    Comment: (NOTE) SARS-CoV-2 target nucleic acids are NOT DETECTED. The SARS-CoV-2 RNA is generally detectable in upper and lower respiratory specimens during the acute phase of infection. Negative results do not preclude SARS-CoV-2 infection, do not rule out co-infections with other  pathogens, and should not be used as the sole basis for treatment or other patient management decisions. Negative results must be combined with clinical observations, patient history, and epidemiological information. The expected result is Negative. Fact Sheet for Patients: SugarRoll.be Fact Sheet for Healthcare Providers: https://www.woods-mathews.com/ This test is not yet approved or cleared by the [place] FDA and  has been authorized for detection and/or diagnosis of SARS-CoV-2 by FDA under an Emergency Use Authorization (EUA). This EUA will remain  in effect (meaning this test can be used) for the duration of the COVID-19 declaration under Section 56 4(b)(1) of the Act, 21 U.S.C. section 360bbb-3(b)(1), unless the authorization is terminated or revoked sooner. Performed at [facility], [place] [address], [place], [place] [address]          Radiology Studies: US Venous Img Lower Bilateral (DVT)  Result Date: 09/18/2019 CLINICAL DATA:  Lower extremity edema EXAM: BILATERAL LOWER EXTREMITY VENOUS DOPPLER ULTRASOUND TECHNIQUE: Gray-scale sonography with graded compression, as well as color Doppler and duplex ultrasound were performed to evaluate the lower extremity deep venous systems from the level of the common femoral vein and including the common femoral, femoral, profunda femoral, popliteal and calf veins including the posterior tibial, peroneal and gastrocnemius veins when visible. The superficial great saphenous vein was also interrogated. Spectral Doppler was utilized to evaluate flow at rest and with distal augmentation maneuvers in the common femoral, femoral and popliteal veins. COMPARISON:  None. FINDINGS: RIGHT LOWER EXTREMITY Common Femoral Vein: No evidence of thrombus. Normal compressibility, respiratory phasicity and response to augmentation. Saphenofemoral Junction: No evidence of thrombus. Normal compressibility and flow  on color Doppler imaging. Profunda Femoral Vein: No evidence of thrombus. Normal compressibility and flow on color Doppler imaging. Femoral Vein: No evidence of thrombus. Normal compressibility, respiratory phasicity and response to augmentation. Popliteal Vein: No evidence of thrombus. Normal compressibility, respiratory phasicity and response to augmentation. Calf Veins: No evidence of thrombus. Normal compressibility and flow on color Doppler imaging. LEFT LOWER EXTREMITY Common Femoral Vein: No evidence of thrombus. Normal compressibility, respiratory phasicity and response to augmentation. Saphenofemoral Junction: No evidence of thrombus. Normal compressibility and flow on color Doppler imaging. Profunda Femoral Vein: No evidence of  thrombus. Normal compressibility and flow on color Doppler imaging. Femoral Vein: No evidence of thrombus. Normal compressibility, respiratory phasicity and response to augmentation. Popliteal Vein: No evidence of thrombus. Normal compressibility, respiratory phasicity and response to augmentation. Calf Veins: No evidence of thrombus. Normal compressibility and flow on color Doppler imaging. IMPRESSION: No evidence of deep venous thrombosis in either lower extremity. Electronically Signed   By: Jerilynn Mages.  Shick M.D.   On: 09/18/2019 10:56   DG Chest Port 1 View  Result Date: 09/18/2019 CLINICAL DATA:  Hypoxia per physician order. Pt admitted 09/14/2019 with worsening SOB and weight gain. Hx - CHF, CKD, COPD, DM, HTN, pacemaker, aortic valve replacement, non-smoker. EXAM: PORTABLE CHEST 1 VIEW COMPARISON:  09/17/2019 FINDINGS: Stable cardiomegaly and changes from prior cardiac surgery. Mild hazy opacity at the lung bases, greater on the right, consistent with small effusions. Stable vascular congestion without overt pulmonary edema. No pneumothorax. IMPRESSION: 1. No significant change from the previous day's exam. 2. Right greater than left lung base opacities consistent with small  effusions with atelectasis. No convincing pneumonia and no overt pulmonary edema. Electronically Signed   By: Lajean Manes M.D.   On: 09/18/2019 08:39   DG Chest Port 1 View  Result Date: 09/17/2019 CLINICAL DATA:  Hypoxia per physician order. Pt admitted 09/14/2019 for acute CHF and acute on chronic diastolic CH. Hx - CHF, COPD, DM, HTN, aortic valve replacement, pacemaker. EXAM: PORTABLE CHEST 1 VIEW COMPARISON:  09/16/2019 FINDINGS: Persistent lung base opacities, greater on the right, consistent with pleural effusions with atelectasis and/or pneumonia. Persistent vascular congestion without overt edema. Stable mild cardiomegaly and changes from prior cardiac surgery. No pneumothorax. Left anterior chest wall pacemaker is stable. IMPRESSION: 1. No significant change from the previous day's study. 2. Small, right greater than left, pleural effusions with associated atelectasis and/or pneumonia. Electronically Signed   By: Lajean Manes M.D.   On: 09/17/2019 09:23        Scheduled Meds: . aspirin EC  81 mg Oral Daily  . atorvastatin  10 mg Oral Daily  . carvedilol  6.25 mg Oral BID WC  . diltiazem  240 mg Oral Daily  . furosemide  80 mg Intravenous BID  . hydrALAZINE  37.5 mg Oral TID  . insulin aspart  0-9 Units Subcutaneous TID PC & HS  . insulin aspart protamine- aspart  7 Units Subcutaneous BID WC  . ipratropium-albuterol  3 mL Nebulization TID  . pantoprazole  40 mg Oral Daily  . sodium chloride flush  3 mL Intravenous Q12H  . Warfarin - Pharmacist Dosing Inpatient   Does not apply q1800   Continuous Infusions: . sodium chloride       LOS: 3 days    Time spent: over 30 min    Fayrene Helper, MD [facility]   To contact the attending provider between 7A-7P or the covering provider during after hours 7P-7A, please log into the web site [email or web address] and access using universal [place] password for that web site. If you do not have the password, please call the  hospital operator.  09/18/2019, 4:10 PM

## 2019-09-19 ENCOUNTER — Inpatient Hospital Stay: Payer: Medicare Other

## 2019-09-19 LAB — COMPREHENSIVE METABOLIC PANEL
ALT: 16 U/L (ref 0–44)
AST: 21 U/L (ref 15–41)
Albumin: 3.3 g/dL — ABNORMAL LOW (ref 3.5–5.0)
Alkaline Phosphatase: 78 U/L (ref 38–126)
Anion gap: 11 (ref 5–15)
BUN: 32 mg/dL — ABNORMAL HIGH (ref 8–23)
CO2: 32 mmol/L (ref 22–32)
Calcium: 8.9 mg/dL (ref 8.9–10.3)
Chloride: 99 mmol/L (ref 98–111)
Creatinine, Ser: 1.31 mg/dL — ABNORMAL HIGH (ref 0.44–1.00)
GFR calc Af Amer: 42 mL/min — ABNORMAL LOW (ref >60)
GFR calc non Af Amer: 37 mL/min — ABNORMAL LOW (ref >60)
Glucose, Bld: 169 mg/dL — ABNORMAL HIGH (ref 70–99)
Potassium: 3.9 mmol/L (ref 3.5–5.1)
Sodium: 142 mmol/L (ref 135–145)
Total Bilirubin: 0.6 mg/dL (ref 0.3–1.2)
Total Protein: 7.2 g/dL (ref 6.5–8.1)

## 2019-09-19 LAB — CBC WITH DIFFERENTIAL/PLATELET
Abs Immature Granulocytes: 0.01 10*3/uL (ref 0.00–0.07)
Basophils Absolute: 0.1 10*3/uL (ref 0.0–0.1)
Basophils Relative: 1 %
Eosinophils Absolute: 0.2 10*3/uL (ref 0.0–0.5)
Eosinophils Relative: 4 %
HCT: 34.1 % — ABNORMAL LOW (ref 36.0–46.0)
Hemoglobin: 9.6 g/dL — ABNORMAL LOW (ref 12.0–15.0)
Immature Granulocytes: 0 %
Lymphocytes Relative: 16 %
Lymphs Abs: 1 10*3/uL (ref 0.7–4.0)
MCH: 23.5 pg — ABNORMAL LOW (ref 26.0–34.0)
MCHC: 28.2 g/dL — ABNORMAL LOW (ref 30.0–36.0)
MCV: 83.6 fL (ref 80.0–100.0)
Monocytes Absolute: 0.7 10*3/uL (ref 0.1–1.0)
Monocytes Relative: 12 %
Neutro Abs: 3.9 10*3/uL (ref 1.7–7.7)
Neutrophils Relative %: 67 %
Platelets: 208 10*3/uL (ref 150–400)
RBC: 4.08 MIL/uL (ref 3.87–5.11)
RDW: 19.1 % — ABNORMAL HIGH (ref 11.5–15.5)
WBC: 5.8 10*3/uL (ref 4.0–10.5)
nRBC: 0 % (ref 0.0–0.2)

## 2019-09-19 LAB — GLUCOSE, CAPILLARY
Glucose-Capillary: 195 mg/dL — ABNORMAL HIGH (ref 70–99)
Glucose-Capillary: 313 mg/dL — ABNORMAL HIGH (ref 70–99)
Glucose-Capillary: 215 mg/dL — ABNORMAL HIGH (ref 70–99)
Glucose-Capillary: 178 mg/dL — ABNORMAL HIGH (ref 70–99)

## 2019-09-19 LAB — PROTIME-INR
INR: 4.1 (ref 0.8–1.2)
Prothrombin Time: 39.9 seconds — ABNORMAL HIGH (ref 11.4–15.2)

## 2019-09-19 LAB — BRAIN NATRIURETIC PEPTIDE: B Natriuretic Peptide: 647 pg/mL — ABNORMAL HIGH (ref 0.0–100.0)

## 2019-09-19 LAB — PHOSPHORUS: Phosphorus: 4.1 mg/dL (ref 2.5–4.6)

## 2019-09-19 LAB — MAGNESIUM: Magnesium: 2.3 mg/dL (ref 1.7–2.4)

## 2019-09-19 MED ORDER — POLYETHYLENE GLYCOL 3350 17 G PO PACK
17.0000 g | PACK | Freq: Two times a day (BID) | ORAL | Status: DC
  Administered 2019-09-19 – 2019-09-22 (×6): 17 g via ORAL
  Filled 2019-09-19 (×6): qty 1

## 2019-09-19 MED ORDER — INSULIN ASPART PROT & ASPART (70-30 MIX) 100 UNIT/ML ~~LOC~~ SUSP
10.0000 [IU] | Freq: Two times a day (BID) | SUBCUTANEOUS | Status: DC
  Administered 2019-09-20 (×2): 10 [IU] via SUBCUTANEOUS
  Filled 2019-09-19 (×3): qty 10

## 2019-09-19 MED ORDER — BISACODYL 10 MG RE SUPP
10.0000 mg | Freq: Every day | RECTAL | Status: DC | PRN
  Administered 2019-09-19: 10 mg via RECTAL
  Filled 2019-09-19: qty 1

## 2019-09-19 NOTE — Consult Note (Addendum)
[place] for Warfarin Indication: atrial fibrillation  No Known Allergies  Patient Measurements: Height: 5\' 2"  (157.5 cm) Weight: 191 lb 12.8 oz (87 kg) IBW/kg (Calculated) : 50.1   Vital Signs: Temp: 97.5 F (36.4 C) (03/15 0724) Temp Source: Oral (03/15 0724) BP: 143/57 (03/15 0724) Pulse Rate: 64 (03/15 0724)  Labs: Recent Labs    09/17/19 0636 09/17/19 0636 09/18/19 0543 09/19/19 0342  HGB 9.9*   < > 10.7* 9.6*  HCT 34.4*  --  36.8 34.1*  PLT 211  --  225 208  LABPROT 29.2*  --  34.4* 39.9*  INR 2.8*  --  3.4* 4.1*  CREATININE 1.41*  --  1.19* 1.31*   < > = values in this interval not displayed.    Estimated Creatinine Clearance: 31 mL/min (A) (by C-G formula based on SCr of 1.31 mg/dL (H)).   Medications:  Warfarin 7.5 mg Thursday and 5 mg all other days- confirmed with patient dose and last dose was 3/10 (evening)  Assessment: Kimberly Peterson  is a 86 y.o. female with a known history of Afib (on warfarin), CHF, CKD, COPD, T2DM, HTN who presented to the ED with acute onset of worsening dyspnea for about a week. Pharmacy consulted to manage warfarin inpatient.   Date  INR  Dose  3/10  2.5  5 mg (patient reported) 3/11  2.8  7.5 mg 3/12  2.6  5 mg 3/13  2.8  5 mg 3/14  3.4                    Held 3/15                 4.1                    Held  Today's INR: supratherapeutic  H&H, platelets stable   Goal of Therapy:  INR 2-3 Monitor platelets by anticoagulation protocol: Yes   Plan:  INR is supratherapeutic. Will hold warfarin tonight, restart warfarin once INR < 3. DDI: diltiazem, aspirin. If INR continues to trend up > 5 will recommend to give Vitamin K PO x1. Daily INR ordered. CBC stable, order at least q 3 days.   Oswald Hillock, PharmD, BCPS 09/19/2019,9:07 AM

## 2019-09-19 NOTE — Progress Notes (Signed)
PT Cancellation Note  Patient Details Name: Kimberly Peterson MRN: [id number] DOB: 04/21/1930   Cancelled Treatment:    Reason Eval/Treat Not Completed: Other (comment)(Pt up in chair with RN at bedside, just starting breakfast. PT to re-attempt as able.)   Lieutenant Diego PT, DPT 9:23 AM,09/19/19

## 2019-09-19 NOTE — Progress Notes (Signed)
PT Cancellation Note  Patient Details Name: Kimberly Peterson MRN: [id number] DOB: 01/12/1930   Cancelled Treatment:    Reason Eval/Treat Not Completed: Other (comment)(Pt up in chair, stated she had just started eating lunch. PT to re-attempt as able.)   Lieutenant Diego PT, DPT 1:51 PM,09/19/19

## 2019-09-19 NOTE — Progress Notes (Addendum)
PROGRESS NOTE    Kimberly Peterson  KHT:[id number] DOB: 10-16-1929 DOA: 09/14/2019 PCP: Kimberly Guise, MD   Brief Narrative:  Kimberly Peterson  is Kimberly Peterson 86 y.o. female with Kimberly Peterson known history of CHF, chronic kidney disease, COPD, type 2 diabetes mellitus and hypertension, presented to the emergency room with acute onset of worsening dyspnea for about Kimberly Peterson week with associated increased oxygen requirement above her baseline of 2 L/min with chronic respiratory failure.  She stated that [name] is not getting her enough diuresis.  She denied any cough or wheezing.  No fever or chills. No nausea or vomiting or abdominal pain.  No dysuria, oliguria or hematuria or flank pain.  Upon presentation to the emergency room, respiratory rate was 27 and pulse ox 97% on 4 L of O2 by nasal cannula after being initially placed on nonrebreather by EMS.  Respiratory rate was later 33.  CBC showed  hemoglobin of 10.7 hematocrit 37 better than previous levels.  BNP was 603.  CMP was remarkable for Kimberly Peterson BUN of 31 and creatinine 1.52 compared to 18/1.1 on 07/16/2019 with GFR of 35 compared to 52 then.  For which x-ray showed mild CHF looking better than previous chest x-ray.  EKG showed normal sinus rhythm with rate of 65 with nonspecific intraventricular conduction delay.  The patient was given 40 mg of IV Lasix.  She continued to have dyspnea and increased oxygen requirement.  She will be admitted to an observation telemetry bed for further evaluation and management.  Assessment & Plan:   Active Problems:   Acute CHF (congestive heart failure) (HCC)   Acute on chronic diastolic CHF (congestive heart failure) ([place])   Heart failure ([place])   1.  Acute on chronic diastolic CHF with subsequent acute on chronic hypoxic respiratory failure. - Increase lasix 80 IV BID (on this previous admission) (on torsemide 40 mg daily at home, may need increased dose?) - Troponins elevated, but flat (similar to prior values), not suggestive of ACS - EKG  appears similar to priors - echo from 07/12/19 with EF 60-65%, no RWMA -will need outpatient cardiology follow up, consider cardiology c/s here as needed if difficulty with diuresis -We will continue Coreg and hydralazine. - uses 2 L at home, now on 4 L - wean as tolerated - I/O, daily weights - 87 on 3/14 (was 84.2 kg on 1/9).  Net negative 3.5 L.   - CXR 3/12 without substantial change, low volume film with vascular congestion and R base collapse/consolidation with small R pleural effusion - CXR 3/13 with R> L effusions with associated atelectasis vs pneumonia  (low suspicon for pneumonia without WBC count or fever, continue to monitor)  - CXR 3/14 without significant change, small effusions with atelectasis - CXR 3/15 with small R effusion - LE [place] negative for DVT  Wt Readings from Last 3 Encounters:  09/19/19 87 kg  09/09/19 83.3 kg  08/23/19 83.4 kg   2.  Coagulopathy secondary to Coumadin, for history of atrial fibrillation.  Patient is status post aortic valve replacement.  S/p Pacemaker placement. -warfarin per pharmacy -> supratherapeutic today, on hold  3.  Acute kidney injury superimposed on stage IIIb chronic kidney disease. - baseline creatinine 1.10, 1.52 at presentation, likely 2/2 HF - fluctuating with diuresis, follow  4.  Hypertension. -We will continue Coreg, hydralazine and Cartia XT.  5.  Type II diabetes mellitus  Hypoglycemia - Continue 70/30 and SSI - adjust as needed  6.  GERD. -PPI therapy  will be resumed.  7.  Dyslipidemia. -We will continue statin therapy.  OSA: cpap  DVT prophylaxis: warfarin Code Status: DNR Family Communication: none at bedside - daughter 3/15 Disposition Plan:  . Patient came from: home            . Anticipated d/c place: home . Barriers to d/c OR conditions which need to be met to effect Kimberly Peterson safe d/c: pending further improvement in SOB, IV diuresis Consultants:   none  Procedures:   none  Antimicrobials:   Anti-infectives (From admission, onward)   None     Subjective: Feeling ebtter, no new complaint  Objective: Vitals:   09/19/19 0724 09/19/19 1221 09/19/19 1424 09/19/19 1559  BP: (!) 143/57 (!) 113/50  129/70  Pulse: 64 65  65  Resp: 17 17  18   Temp: (!) 97.5 F (36.4 C) 98.5 F (36.9 C)  97.8 F (36.6 C)  TempSrc: Oral Oral  Oral  SpO2: 93% 90% 90% 99%  Weight:      Height:        Intake/Output Summary (Last 24 hours) at 09/19/2019 1738 Last data filed at 09/19/2019 1001 Gross per 24 hour  Intake 720 ml  Output 300 ml  Net 420 ml   Filed Weights   09/17/19 0329 09/18/19 0350 09/19/19 0359  Weight: 89.6 kg 88.8 kg 87 kg    Examination:  General: No acute distress. Cardiovascular: Heart sounds show Kimberly Peterson regular rate, and rhythm.  Lungs: Clear to auscultation bilaterally Abdomen: Soft, nontender, nondistended  Neurological: Alert and oriented 3. Moves all extremities 4. Cranial nerves II through XII grossly intact. Skin: Warm and dry. No rashes or lesions. Extremities: R>L LLE        Data Reviewed: I have personally reviewed following labs and imaging studies  CBC: Recent Labs  Lab 09/15/19 0450 09/16/19 0456 09/17/19 0636 09/18/19 0543 09/19/19 0342  WBC 6.2 6.2 5.9 6.3 5.8  NEUTROABS 3.9  --  4.0 4.1 3.9  HGB 9.9* 10.0* 9.9* 10.7* 9.6*  HCT 34.4* 34.2* 34.4* 36.8 34.1*  MCV 81.7 81.4 83.1 82.3 83.6  PLT 231 231 211 225 818   Basic Metabolic Panel: Recent Labs  Lab 09/15/19 0618 09/16/19 0456 09/17/19 0636 09/18/19 0543 09/19/19 0342  NA 143 142 141 142 142  K 3.3* 4.3 3.8 3.7 3.9  CL 102 103 101 101 99  CO2 31 30 30  35* 32  GLUCOSE 64* 162* 186* 150* 169*  BUN 30* 28* 32* 30* 32*  CREATININE 1.32* 1.43* 1.41* 1.19* 1.31*  CALCIUM 8.7* 8.9 8.7* 9.0 8.9  MG  --   --  2.4 2.3 2.3  PHOS  --   --  4.1 3.7 4.1   GFR: Estimated Creatinine Clearance: 31 mL/min (Kimberly Peterson) (by C-G formula based on SCr of 1.31 mg/dL (H)). Liver Function  Tests: Recent Labs  Lab 09/14/19 2133 09/15/19 0618 09/17/19 0636 09/18/19 0543 09/19/19 0342  AST 65* 52* 30 25 21   ALT 28 27 21 20 16   ALKPHOS 100 93 76 81 78  BILITOT 0.7 0.7 0.8 0.8 0.6  PROT 7.6 7.4 7.5 7.6 7.2  ALBUMIN 3.5 3.3* 3.5 3.5 3.3*   No results for input(s): LIPASE, AMYLASE in the last 168 hours. No results for input(s): AMMONIA in the last 168 hours. Coagulation Profile: Recent Labs  Lab 09/15/19 1912 09/16/19 0456 09/17/19 0636 09/18/19 0543 09/19/19 0342  INR 2.8* 2.6* 2.8* 3.4* 4.1*   Cardiac Enzymes: No results for input(s): CKTOTAL, CKMB, CKMBINDEX,  TROPONINI in the last 168 hours. BNP (last 3 results) No results for input(s): PROBNP in the last 8760 hours. HbA1C: No results for input(s): HGBA1C in the last 72 hours. CBG: Recent Labs  Lab 09/18/19 1605 09/18/19 2105 09/19/19 0725 09/19/19 1223 09/19/19 1600  GLUCAP 230* 223* 195* 313* 215*   Lipid Profile: No results for input(s): CHOL, HDL, LDLCALC, TRIG, CHOLHDL, LDLDIRECT in the last 72 hours. Thyroid Function Tests: No results for input(s): TSH, T4TOTAL, FREET4, T3FREE, THYROIDAB in the last 72 hours. Anemia Panel: No results for input(s): VITAMINB12, FOLATE, FERRITIN, TIBC, IRON, RETICCTPCT in the last 72 hours. Sepsis Labs: No results for input(s): PROCALCITON, LATICACIDVEN in the last 168 hours.  Recent Results (from the past 240 hour(s))  SARS CORONAVIRUS 2 (TAT 6-24 HRS) Nasopharyngeal Nasopharyngeal Swab     Status: None   Collection Time: 09/15/19 12:31 AM   Specimen: Nasopharyngeal Swab  Result Value Ref Range Status   SARS Coronavirus 2 NEGATIVE NEGATIVE Final    Comment: (NOTE) SARS-CoV-2 target nucleic acids are NOT DETECTED. The SARS-CoV-2 RNA is generally detectable in upper and lower respiratory specimens during the acute phase of infection. Negative results do not preclude SARS-CoV-2 infection, do not rule out co-infections with other pathogens, and should not be  used as the sole basis for treatment or other patient management decisions. Negative results must be combined with clinical observations, patient history, and epidemiological information. The expected result is Negative. Fact Sheet for Patients: SugarRoll.be Fact Sheet for Healthcare Providers: https://www.woods-mathews.com/ This test is not yet approved or cleared by the [place] FDA and  has been authorized for detection and/or diagnosis of SARS-CoV-2 by FDA under an Emergency Use Authorization (EUA). This EUA will remain  in effect (meaning this test can be used) for the duration of the COVID-19 declaration under Section 56 4(b)(1) of the Act, 21 U.S.C. section 360bbb-3(b)(1), unless the authorization is terminated or revoked sooner. Performed at [facility], [place] [address] [place], [place] [address]          Radiology Studies: US Venous Img Lower Bilateral (DVT)  Result Date: 09/18/2019 CLINICAL DATA:  Lower extremity edema EXAM: BILATERAL LOWER EXTREMITY VENOUS DOPPLER ULTRASOUND TECHNIQUE: Gray-scale sonography with graded compression, as well as color Doppler and duplex ultrasound were performed to evaluate the lower extremity deep venous systems from the level of the common femoral vein and including the common femoral, femoral, profunda femoral, popliteal and calf veins including the posterior tibial, peroneal and gastrocnemius veins when visible. The superficial great saphenous vein was also interrogated. Spectral Doppler was utilized to evaluate flow at rest and with distal augmentation maneuvers in the common femoral, femoral and popliteal veins. COMPARISON:  None. FINDINGS: RIGHT LOWER EXTREMITY Common Femoral Vein: No evidence of thrombus. Normal compressibility, respiratory phasicity and response to augmentation. Saphenofemoral Junction: No evidence of thrombus. Normal compressibility and flow on color Doppler imaging.  Profunda Femoral Vein: No evidence of thrombus. Normal compressibility and flow on color Doppler imaging. Femoral Vein: No evidence of thrombus. Normal compressibility, respiratory phasicity and response to augmentation. Popliteal Vein: No evidence of thrombus. Normal compressibility, respiratory phasicity and response to augmentation. Calf Veins: No evidence of thrombus. Normal compressibility and flow on color Doppler imaging. LEFT LOWER EXTREMITY Common Femoral Vein: No evidence of thrombus. Normal compressibility, respiratory phasicity and response to augmentation. Saphenofemoral Junction: No evidence of thrombus. Normal compressibility and flow on color Doppler imaging. Profunda Femoral Vein: No evidence of thrombus. Normal compressibility and flow on  color Doppler imaging. Femoral Vein: No evidence of thrombus. Normal compressibility, respiratory phasicity and response to augmentation. Popliteal Vein: No evidence of thrombus. Normal compressibility, respiratory phasicity and response to augmentation. Calf Veins: No evidence of thrombus. Normal compressibility and flow on color Doppler imaging. IMPRESSION: No evidence of deep venous thrombosis in either lower extremity. Electronically Signed   By: Jerilynn Mages.  Shick M.D.   On: 09/18/2019 10:56   DG Chest Port 1 View  Result Date: 09/19/2019 CLINICAL DATA:  Hypoxia. EXAM: PORTABLE CHEST 1 VIEW COMPARISON:  September 18, 2019. FINDINGS: Stable cardiomegaly. Sternotomy wires are noted. Left-sided pacemaker is unchanged in position. No pneumothorax is noted. Small right pleural effusion is noted with associated subsegmental atelectasis. Bony thorax is unremarkable. IMPRESSION: Small right pleural effusion with associated subsegmental atelectasis. Electronically Signed   By: Marijo Conception M.D.   On: 09/19/2019 08:09   DG Chest Port 1 View  Result Date: 09/18/2019 CLINICAL DATA:  Hypoxia per physician order. Pt admitted 09/14/2019 with worsening SOB and weight gain. Hx -  CHF, CKD, COPD, DM, HTN, pacemaker, aortic valve replacement, non-smoker. EXAM: PORTABLE CHEST 1 VIEW COMPARISON:  09/17/2019 FINDINGS: Stable cardiomegaly and changes from prior cardiac surgery. Mild hazy opacity at the lung bases, greater on the right, consistent with small effusions. Stable vascular congestion without overt pulmonary edema. No pneumothorax. IMPRESSION: 1. No significant change from the previous day's exam. 2. Right greater than left lung base opacities consistent with small effusions with atelectasis. No convincing pneumonia and no overt pulmonary edema. Electronically Signed   By: Lajean Manes M.D.   On: 09/18/2019 08:39        Scheduled Meds: . aspirin EC  81 mg Oral Daily  . atorvastatin  10 mg Oral Daily  . carvedilol  6.25 mg Oral BID WC  . diltiazem  240 mg Oral Daily  . furosemide  80 mg Intravenous BID  . hydrALAZINE  37.5 mg Oral TID  . insulin aspart  0-9 Units Subcutaneous TID PC & HS  . insulin aspart protamine- aspart  7 Units Subcutaneous BID WC  . ipratropium-albuterol  3 mL Nebulization TID  . pantoprazole  40 mg Oral Daily  . polyethylene glycol  17 g Oral BID  . sodium chloride flush  3 mL Intravenous Q12H  . Warfarin - Pharmacist Dosing Inpatient   Does not apply q1800   Continuous Infusions: . sodium chloride       LOS: 4 days    Time spent: over 30 min    Fayrene Helper, MD [facility]   To contact the attending provider between 7A-7P or the covering provider during after hours 7P-7A, please log into the web site [email or web address] and access using universal [place] password for that web site. If you do not have the password, please call the hospital operator.  09/19/2019, 5:38 PM

## 2019-09-19 NOTE — Care Management Important Message (Signed)
Important Message  Patient Details  Name: Kimberly Peterson MRN: [id number] Date of Birth: 23-Aug-1929   Medicare Important Message Given:  Yes     Dannette Barbara 09/19/2019, 12:33 PM

## 2019-09-19 NOTE — Progress Notes (Signed)
Followed up with RN appx hour after last note. She states patient called out and asked to come off cpap. I checked in on patient and found her awake for the most part. She stated that she was having a hard time sleeping maybe due to being uncomfortable from being in the chair for so long and felt like she could not breathe on the cpap.  She was thinking about getting in the bed but decided to stay off cpap for now. Will continue to monitor.

## 2019-09-20 LAB — COMPREHENSIVE METABOLIC PANEL
ALT: 15 U/L (ref 0–44)
AST: 20 U/L (ref 15–41)
Albumin: 3.3 g/dL — ABNORMAL LOW (ref 3.5–5.0)
Alkaline Phosphatase: 75 U/L (ref 38–126)
Anion gap: 12 (ref 5–15)
BUN: 34 mg/dL — ABNORMAL HIGH (ref 8–23)
CO2: 32 mmol/L (ref 22–32)
Calcium: 8.9 mg/dL (ref 8.9–10.3)
Chloride: 97 mmol/L — ABNORMAL LOW (ref 98–111)
Creatinine, Ser: 1.33 mg/dL — ABNORMAL HIGH (ref 0.44–1.00)
GFR calc Af Amer: 42 mL/min — ABNORMAL LOW (ref >60)
GFR calc non Af Amer: 36 mL/min — ABNORMAL LOW (ref >60)
Glucose, Bld: 218 mg/dL — ABNORMAL HIGH (ref 70–99)
Potassium: 4 mmol/L (ref 3.5–5.1)
Sodium: 141 mmol/L (ref 135–145)
Total Bilirubin: 0.8 mg/dL (ref 0.3–1.2)
Total Protein: 7.4 g/dL (ref 6.5–8.1)

## 2019-09-20 LAB — CBC WITH DIFFERENTIAL/PLATELET
Abs Immature Granulocytes: 0.01 10*3/uL (ref 0.00–0.07)
Basophils Absolute: 0 10*3/uL (ref 0.0–0.1)
Basophils Relative: 1 %
Eosinophils Absolute: 0.2 10*3/uL (ref 0.0–0.5)
Eosinophils Relative: 4 %
HCT: 34.5 % — ABNORMAL LOW (ref 36.0–46.0)
Hemoglobin: 10.3 g/dL — ABNORMAL LOW (ref 12.0–15.0)
Immature Granulocytes: 0 %
Lymphocytes Relative: 22 %
Lymphs Abs: 1.2 10*3/uL (ref 0.7–4.0)
MCH: 24 pg — ABNORMAL LOW (ref 26.0–34.0)
MCHC: 29.9 g/dL — ABNORMAL LOW (ref 30.0–36.0)
MCV: 80.4 fL (ref 80.0–100.0)
Monocytes Absolute: 0.7 10*3/uL (ref 0.1–1.0)
Monocytes Relative: 13 %
Neutro Abs: 3.3 10*3/uL (ref 1.7–7.7)
Neutrophils Relative %: 60 %
Platelets: 211 10*3/uL (ref 150–400)
RBC: 4.29 MIL/uL (ref 3.87–5.11)
RDW: 19.1 % — ABNORMAL HIGH (ref 11.5–15.5)
WBC: 5.5 10*3/uL (ref 4.0–10.5)
nRBC: 0 % (ref 0.0–0.2)

## 2019-09-20 LAB — MAGNESIUM: Magnesium: 2.4 mg/dL (ref 1.7–2.4)

## 2019-09-20 LAB — GLUCOSE, CAPILLARY
Glucose-Capillary: 192 mg/dL — ABNORMAL HIGH (ref 70–99)
Glucose-Capillary: 285 mg/dL — ABNORMAL HIGH (ref 70–99)
Glucose-Capillary: 316 mg/dL — ABNORMAL HIGH (ref 70–99)
Glucose-Capillary: 209 mg/dL — ABNORMAL HIGH (ref 70–99)

## 2019-09-20 LAB — PHOSPHORUS: Phosphorus: 3.4 mg/dL (ref 2.5–4.6)

## 2019-09-20 LAB — BRAIN NATRIURETIC PEPTIDE: B Natriuretic Peptide: 527 pg/mL — ABNORMAL HIGH (ref 0.0–100.0)

## 2019-09-20 LAB — PROTIME-INR
INR: 3.4 — ABNORMAL HIGH (ref 0.8–1.2)
Prothrombin Time: 34.3 seconds — ABNORMAL HIGH (ref 11.4–15.2)

## 2019-09-20 MED ORDER — INSULIN ASPART 100 UNIT/ML ~~LOC~~ SOLN
0.0000 [IU] | Freq: Every day | SUBCUTANEOUS | Status: DC
  Administered 2019-09-20: 21:00:00 2 [IU] via SUBCUTANEOUS
  Filled 2019-09-20: qty 1

## 2019-09-20 MED ORDER — WARFARIN SODIUM 5 MG PO TABS: 5.0000 mg | ORAL_TABLET | Freq: Once | ORAL | Status: DC

## 2019-09-20 MED ORDER — INSULIN ASPART PROT & ASPART (70-30 MIX) 100 UNIT/ML ~~LOC~~ SUSP
14.0000 [IU] | Freq: Two times a day (BID) | SUBCUTANEOUS | Status: DC
  Administered 2019-09-21 – 2019-09-22 (×3): 14 [IU] via SUBCUTANEOUS
  Filled 2019-09-20 (×3): qty 10

## 2019-09-20 MED ORDER — FUROSEMIDE 10 MG/ML IJ SOLN
80.0000 mg | Freq: Two times a day (BID) | INTRAMUSCULAR | Status: DC
  Administered 2019-09-20 – 2019-09-22 (×4): 80 mg via INTRAVENOUS
  Filled 2019-09-20 (×4): qty 8

## 2019-09-20 MED ORDER — INSULIN ASPART 100 UNIT/ML ~~LOC~~ SOLN
0.0000 [IU] | Freq: Three times a day (TID) | SUBCUTANEOUS | Status: DC
  Administered 2019-09-21: 2 [IU] via SUBCUTANEOUS
  Administered 2019-09-21 (×2): 5 [IU] via SUBCUTANEOUS
  Administered 2019-09-22: 13:00:00 3 [IU] via SUBCUTANEOUS
  Administered 2019-09-22: 5 [IU] via SUBCUTANEOUS
  Filled 2019-09-20 (×5): qty 1

## 2019-09-20 NOTE — Progress Notes (Signed)
PROGRESS NOTE    [name]  LKG:[id number] DOB: 02-24-1930 DOA: 09/14/2019 PCP: [name], MD   Brief Narrative:  [name]  is Kimberly Peterson 86 y.o. female with Gurinder Toral known history of CHF, chronic kidney disease, COPD, type 2 diabetes mellitus and hypertension, presented to the emergency room with acute onset of worsening dyspnea for about Kimberly Peterson week with associated increased oxygen requirement above her baseline of 2 L/min with chronic respiratory failure.  She stated that [name] is not getting her enough diuresis.  She denied any cough or wheezing.  No fever or chills. No nausea or vomiting or abdominal pain.  No dysuria, oliguria or hematuria or flank pain.  Admitted for HF exacerbation.  Improving with IV diuresis.  Will likely need higher dose of torsemide prior to discharge.  Assessment & Plan:   Active Problems:   Acute CHF (congestive heart failure) (HCC)   Acute on chronic diastolic CHF (congestive heart failure) ([place])   Heart failure ([place])   1.  Acute on chronic diastolic CHF with subsequent acute on chronic hypoxic respiratory failure. - Increase lasix 80 IV BID (on this previous admission) (on torsemide 40 mg daily at home, may need increased dose?) - Troponins elevated, but flat (similar to prior values), not suggestive of ACS - EKG appears similar to priors - echo from 07/12/19 with EF 60-65%, no RWMA -will need outpatient cardiology follow up, consider cardiology c/s here as needed if difficulty with diuresis -We will continue Coreg and hydralazine. - uses 2 L at home, now on 3 L - wean as tolerated - I/O, daily weights - 89 on 3/14 (was 84.2 kg on 1/9).  Net negative 3.9 L.   - CXR 3/12 without substantial change, low volume film with vascular congestion and R base collapse/consolidation with small R pleural effusion - CXR 3/13 with R> L effusions with associated atelectasis vs pneumonia  (low suspicon for pneumonia without WBC count or fever, continue to monitor)  - CXR 3/14  without significant change, small effusions with atelectasis - CXR 3/15 with small R effusion - LE [place] negative for DVT  Wt Readings from Last 3 Encounters:  09/20/19 89 kg  09/09/19 83.3 kg  08/23/19 83.4 kg   2.  Coagulopathy secondary to Coumadin, for history of atrial fibrillation.  Patient is status post aortic valve replacement.  S/p Pacemaker placement. -warfarin per pharmacy -> supratherapeutic today, on hold  3.  Acute kidney injury superimposed on stage IIIb chronic kidney disease. - baseline creatinine 1.10, 1.52 at presentation, likely 2/2 HF - fluctuating with diuresis, follow  4.  Hypertension. -We will continue Coreg, hydralazine and Cartia XT.  5.  Type II diabetes mellitus  Hypoglycemia - Continue 70/30 and SSI - adjust as needed  6.  GERD. -PPI therapy will be resumed.  7.  Dyslipidemia. -We will continue statin therapy.  OSA: cpap  DVT prophylaxis: warfarin Code Status: DNR Family Communication: none at bedside - daughter 3/15 Disposition Plan:  . Patient came from: home            . Anticipated d/c place: home . Barriers to d/c OR conditions which need to be met to effect Maxxon Schwanke safe d/c: pending further improvement in SOB, IV diuresis Consultants:   none  Procedures:   none  Antimicrobials:  Anti-infectives (From admission, onward)   None     Subjective: SOB gradually improving  Objective: Vitals:   09/20/19 1113 09/20/19 1652 09/20/19 1940 09/20/19 2001  BP: (!) 122/50 Marland Kitchen)  123/58 (!) 123/53   Pulse: 64 65 65   Resp: 17 18 16    Temp: 97.8 F (36.6 C) 97.6 F (36.4 C) 99.3 F (37.4 C)   TempSrc: Oral  Oral   SpO2: 90% 90% 92% 97%  Weight:      Height:        Intake/Output Summary (Last 24 hours) at 09/20/2019 2042 Last data filed at 09/20/2019 1844 Gross per 24 hour  Intake 720 ml  Output 1000 ml  Net -280 ml   Filed Weights   09/19/19 0359 09/20/19 0012 09/20/19 0323  Weight: 87 kg 89 kg 89 kg     Examination:  General: No acute distress. Cardiovascular: Heart sounds show Braden Cimo regular rate, and rhythm. Lungs: Clear to auscultation bilaterally  Abdomen: Soft, nontender, nondistended  Neurological: Alert and oriented 3. Moves all extremities 4. Cranial nerves II through XII grossly intact. Skin: Warm and dry. No rashes or lesions. Extremities: R>L LEE        Data Reviewed: I have personally reviewed following labs and imaging studies  CBC: Recent Labs  Lab 09/15/19 0450 09/15/19 0450 09/16/19 0456 09/17/19 0636 09/18/19 0543 09/19/19 0342 09/20/19 0433  WBC 6.2   < > 6.2 5.9 6.3 5.8 5.5  NEUTROABS 3.9  --   --  4.0 4.1 3.9 3.3  HGB 9.9*   < > 10.0* 9.9* 10.7* 9.6* 10.3*  HCT 34.4*   < > 34.2* 34.4* 36.8 34.1* 34.5*  MCV 81.7   < > 81.4 83.1 82.3 83.6 80.4  PLT 231   < > 231 211 225 208 211   < > = values in this interval not displayed.   Basic Metabolic Panel: Recent Labs  Lab 09/16/19 0456 09/17/19 0636 09/18/19 0543 09/19/19 0342 09/20/19 0433  NA 142 141 142 142 141  K 4.3 3.8 3.7 3.9 4.0  CL 103 101 101 99 97*  CO2 30 30 35* 32 32  GLUCOSE 162* 186* 150* 169* 218*  BUN 28* 32* 30* 32* 34*  CREATININE 1.43* 1.41* 1.19* 1.31* 1.33*  CALCIUM 8.9 8.7* 9.0 8.9 8.9  MG  --  2.4 2.3 2.3 2.4  PHOS  --  4.1 3.7 4.1 3.4   GFR: Estimated Creatinine Clearance: 30.9 mL/min (Lashayla Armes) (by C-G formula based on SCr of 1.33 mg/dL (H)). Liver Function Tests: Recent Labs  Lab 09/15/19 0618 09/17/19 0636 09/18/19 0543 09/19/19 0342 09/20/19 0433  AST 52* 30 25 21 20   ALT 27 21 20 16 15   ALKPHOS 93 76 81 78 75  BILITOT 0.7 0.8 0.8 0.6 0.8  PROT 7.4 7.5 7.6 7.2 7.4  ALBUMIN 3.3* 3.5 3.5 3.3* 3.3*   No results for input(s): LIPASE, AMYLASE in the last 168 hours. No results for input(s): AMMONIA in the last 168 hours. Coagulation Profile: Recent Labs  Lab 09/16/19 0456 09/17/19 0636 09/18/19 0543 09/19/19 0342 09/20/19 0433  INR 2.6* 2.8* 3.4* 4.1*  3.4*   Cardiac Enzymes: No results for input(s): CKTOTAL, CKMB, CKMBINDEX, TROPONINI in the last 168 hours. BNP (last 3 results) No results for input(s): PROBNP in the last 8760 hours. HbA1C: No results for input(s): HGBA1C in the last 72 hours. CBG: Recent Labs  Lab 09/19/19 1600 09/19/19 2120 09/20/19 0738 09/20/19 1126 09/20/19 1653  GLUCAP 215* 178* 192* 285* 316*   Lipid Profile: No results for input(s): CHOL, HDL, LDLCALC, TRIG, CHOLHDL, LDLDIRECT in the last 72 hours. Thyroid Function Tests: No results for input(s): TSH, T4TOTAL, FREET4, T3FREE, THYROIDAB  in the last 72 hours. Anemia Panel: No results for input(s): VITAMINB12, FOLATE, FERRITIN, TIBC, IRON, RETICCTPCT in the last 72 hours. Sepsis Labs: No results for input(s): PROCALCITON, LATICACIDVEN in the last 168 hours.  Recent Results (from the past 240 hour(s))  SARS CORONAVIRUS 2 (TAT 6-24 HRS) Nasopharyngeal Nasopharyngeal Swab     Status: None   Collection Time: 09/15/19 12:31 AM   Specimen: Nasopharyngeal Swab  Result Value Ref Range Status   SARS Coronavirus 2 NEGATIVE NEGATIVE Final    Comment: (NOTE) SARS-CoV-2 target nucleic acids are NOT DETECTED. The SARS-CoV-2 RNA is generally detectable in upper and lower respiratory specimens during the acute phase of infection. Negative results do not preclude SARS-CoV-2 infection, do not rule out co-infections with other pathogens, and should not be used as the sole basis for treatment or other patient management decisions. Negative results must be combined with clinical observations, patient history, and epidemiological information. The expected result is Negative. Fact Sheet for Patients: SugarRoll.be Fact Sheet for Healthcare Providers: https://www.woods-mathews.com/ This test is not yet approved or cleared by the [place] FDA and  has been authorized for detection and/or diagnosis of SARS-CoV-2 by FDA under  an Emergency Use Authorization (EUA). This EUA will remain  in effect (meaning this test can be used) for the duration of the COVID-19 declaration under Section 56 4(b)(1) of the Act, 21 U.S.C. section 360bbb-3(b)(1), unless the authorization is terminated or revoked sooner. Performed at [facility], [place] [address], [place], [place] [address]          Radiology Studies: DG Chest Port 1 View  Result Date: 09/19/2019 CLINICAL DATA:  Hypoxia. EXAM: PORTABLE CHEST 1 VIEW COMPARISON:  September 18, 2019. FINDINGS: Stable cardiomegaly. Sternotomy wires are noted. Left-sided pacemaker is unchanged in position. No pneumothorax is noted. Small right pleural effusion is noted with associated subsegmental atelectasis. Bony thorax is unremarkable. IMPRESSION: Small right pleural effusion with associated subsegmental atelectasis. Electronically Signed   By: Marijo Conception M.D.   On: 09/19/2019 08:09        Scheduled Meds: . aspirin EC  81 mg Oral Daily  . atorvastatin  10 mg Oral Daily  . carvedilol  6.25 mg Oral BID WC  . diltiazem  240 mg Oral Daily  . furosemide  80 mg Intravenous BID  . hydrALAZINE  37.5 mg Oral TID  . insulin aspart  0-9 Units Subcutaneous TID PC & HS  . insulin aspart protamine- aspart  10 Units Subcutaneous BID WC  . ipratropium-albuterol  3 mL Nebulization TID  . pantoprazole  40 mg Oral Daily  . polyethylene glycol  17 g Oral BID  . sodium chloride flush  3 mL Intravenous Q12H  . Warfarin - Pharmacist Dosing Inpatient   Does not apply q1800   Continuous Infusions: . sodium chloride       LOS: 5 days    Time spent: over 30 min    Fayrene Helper, MD [facility]   To contact the attending provider between 7A-7P or the covering provider during after hours 7P-7A, please log into the web site [email or web address] and access using universal [place] password for that web site. If you do not have the password, please call the hospital  operator.  09/20/2019, 8:42 PM

## 2019-09-20 NOTE — Consult Note (Signed)
[place] for Warfarin Indication: atrial fibrillation  No Known Allergies  Patient Measurements: Height: 5\' 2"  (157.5 cm) Weight: 196 lb 3.4 oz (89 kg) IBW/kg (Calculated) : 50.1   Vital Signs: Temp: 98.4 F (36.9 C) (03/16 0741) Temp Source: Oral (03/16 0741) BP: 146/57 (03/16 0741) Pulse Rate: 64 (03/16 0741)  Labs: Recent Labs    09/18/19 0543 09/18/19 0543 09/19/19 0342 09/20/19 0433  HGB 10.7*   < > 9.6* 10.3*  HCT 36.8  --  34.1* 34.5*  PLT 225  --  208 211  LABPROT 34.4*  --  39.9* 34.3*  INR 3.4*  --  4.1* 3.4*  CREATININE 1.19*  --  1.31* 1.33*   < > = values in this interval not displayed.    Estimated Creatinine Clearance: 30.9 mL/min (A) (by C-G formula based on SCr of 1.33 mg/dL (H)).   Medications:  Warfarin 7.5 mg Thursday and 5 mg all other days- confirmed with patient dose and last dose was 3/10 (evening)  Assessment: Kimberly Peterson  is a 86 y.o. female with a known history of Afib (on warfarin), CHF, CKD, COPD, T2DM, HTN who presented to the ED with acute onset of worsening dyspnea for about a week. Pharmacy consulted to manage warfarin inpatient.   Date  INR  Dose  3/10  2.5  5 mg (patient reported) 3/11  2.8  7.5 mg 3/12  2.6  5 mg 3/13  2.8  5 mg 3/14  3.4                    Held 3/15                 4.1                    Held 3/16                 3.4                    Held  Today's INR: supratherapeutic  H&H, platelets stable   Goal of Therapy:  INR 2-3 Monitor platelets by anticoagulation protocol: Yes   Plan:  INR is supratherapeutic. Will hold warfarin tonight, restart warfarin once INR < 3. DDI: diltiazem, aspirin. Daily INR ordered. CBC stable, order at least q 3 days.   Oswald Hillock, PharmD, BCPS 09/20/2019,8:29 AM

## 2019-09-20 NOTE — Progress Notes (Signed)
Physical Therapy Treatment Patient Details Name: Kimberly Peterson MRN: [id number] DOB: 1930-05-10 Today's Date: 09/20/2019    History of Present Illness 86 year old female admitted with SOB. Acute on chronic diastolic heart failure. PMH includes: COPD, PE, HTN, HLD, Afib, DM.    PT Comments    Pt alert, sitting EOB requesting to utilize [facility]. Pt able to transfer stand pivot with use of BSC handrails, CGA. Pt performed sit<> stand transfers several times during session, CGA and RW, good placement of hands to increase safety. In total the patient ambulated ~35ft with RW and CGA, seated rest break after 22ft to assess pt. On 4L throughout desatted to 88% after exertion but able to increase to 90% with PLB and rest. Pt up in chair, all needs in reach at end of session. Overall the patient demonstrated progression towards goals and would benefit from further skilled PT intervention to maximize safety, mobility, and independence. Current recommendation remains appropriate.    Follow Up Recommendations  Home health PT;Supervision for mobility/OOB     Equipment Recommendations  3in1 (PT)    Recommendations for Other Services       Precautions / Restrictions Precautions Precautions: Fall Restrictions Weight Bearing Restrictions: No    Mobility  Bed Mobility               General bed mobility comments: Pt sitting EOB at start of session  Transfers Overall transfer level: Needs assistance Equipment used: None;Rolling walker (2 wheeled) Transfers: Sit to/from [facility] Sit to Stand: Min guard         General transfer comment: CGA from EOB, from [facility] x2, and from recliner. Safe use of hands with RW. Stand pivot to commode without AD but use of commode handles.  Ambulation/Gait Ambulation/Gait assistance: Min guard Gait Distance (Feet): 27 Feet Assistive device: Rolling walker (2 wheeled)       General Gait Details: no LOB or unsteadiness noted, pt with  difficulty navigating obstacles due to vision. on 4L throughout, desatted to 88% after exertion but improved with PLB. Pt endorsed she is normally SOB at home as well.   Stairs             Wheelchair Mobility    Modified Rankin (Stroke Patients Only)       Balance Overall balance assessment: Modified Independent                                          Cognition Arousal/Alertness: Awake/alert Behavior During Therapy: WFL for tasks assessed/performed Overall Cognitive Status: Within Functional Limits for tasks assessed                                        Exercises Other Exercises Other Exercises: PT assist total care to wipe after BM. Pt able to stand with UE support.    General Comments        Pertinent Vitals/Pain Pain Assessment: No/denies pain    Home Living                      Prior Function            PT Goals (current goals can now be found in the care plan section) Progress towards PT goals: Progressing toward goals  Frequency    Min 2X/week      PT Plan Current plan remains appropriate    Co-evaluation              AM-PAC PT "6 Clicks" Mobility   Outcome Measure  Help needed turning from your back to your side while in a flat bed without using bedrails?: A Little Help needed moving from lying on your back to sitting on the side of a flat bed without using bedrails?: A Little Help needed moving to and from a bed to a chair (including a wheelchair)?: None Help needed standing up from a chair using your arms (e.g., wheelchair or bedside chair)?: None Help needed to walk in hospital room?: None Help needed climbing 3-5 steps with a railing? : A Lot 6 Click Score: 20    End of Session Equipment Utilized During Treatment: Gait belt;Oxygen;Other (comment)(4L) Activity Tolerance: Patient tolerated treatment well Patient left: with call bell/phone within reach;in chair;with chair alarm  set Nurse Communication: Mobility status PT Visit Diagnosis: Muscle weakness (generalized) (M62.81);Difficulty in walking, not elsewhere classified (R26.2)     Time: [id number] PT Time Calculation (min) (ACUTE ONLY): 23 min  Charges:  $Therapeutic Exercise: 23-37 mins                     Lieutenant Diego PT, DPT 11:47 AM,09/20/19

## 2019-09-21 ENCOUNTER — Inpatient Hospital Stay: Payer: Medicare Other

## 2019-09-21 LAB — COMPREHENSIVE METABOLIC PANEL
ALT: 14 U/L (ref 0–44)
AST: 17 U/L (ref 15–41)
Albumin: 3.1 g/dL — ABNORMAL LOW (ref 3.5–5.0)
Alkaline Phosphatase: 70 U/L (ref 38–126)
Anion gap: 12 (ref 5–15)
BUN: 35 mg/dL — ABNORMAL HIGH (ref 8–23)
CO2: 32 mmol/L (ref 22–32)
Calcium: 8.8 mg/dL — ABNORMAL LOW (ref 8.9–10.3)
Chloride: 98 mmol/L (ref 98–111)
Creatinine, Ser: 1.36 mg/dL — ABNORMAL HIGH (ref 0.44–1.00)
GFR calc Af Amer: 40 mL/min — ABNORMAL LOW (ref >60)
GFR calc non Af Amer: 35 mL/min — ABNORMAL LOW (ref >60)
Glucose, Bld: 228 mg/dL — ABNORMAL HIGH (ref 70–99)
Potassium: 3.8 mmol/L (ref 3.5–5.1)
Sodium: 142 mmol/L (ref 135–145)
Total Bilirubin: 0.9 mg/dL (ref 0.3–1.2)
Total Protein: 7.1 g/dL (ref 6.5–8.1)

## 2019-09-21 LAB — PROTIME-INR
INR: 2.8 — ABNORMAL HIGH (ref 0.8–1.2)
Prothrombin Time: 29.2 seconds — ABNORMAL HIGH (ref 11.4–15.2)

## 2019-09-21 LAB — CBC WITH DIFFERENTIAL/PLATELET
Abs Immature Granulocytes: 0.02 10*3/uL (ref 0.00–0.07)
Basophils Absolute: 0 10*3/uL (ref 0.0–0.1)
Basophils Relative: 1 %
Eosinophils Absolute: 0.2 10*3/uL (ref 0.0–0.5)
Eosinophils Relative: 3 %
HCT: 33.5 % — ABNORMAL LOW (ref 36.0–46.0)
Hemoglobin: 9.9 g/dL — ABNORMAL LOW (ref 12.0–15.0)
Immature Granulocytes: 0 %
Lymphocytes Relative: 19 %
Lymphs Abs: 1.2 10*3/uL (ref 0.7–4.0)
MCH: 23.9 pg — ABNORMAL LOW (ref 26.0–34.0)
MCHC: 29.6 g/dL — ABNORMAL LOW (ref 30.0–36.0)
MCV: 80.7 fL (ref 80.0–100.0)
Monocytes Absolute: 0.7 10*3/uL (ref 0.1–1.0)
Monocytes Relative: 11 %
Neutro Abs: 4 10*3/uL (ref 1.7–7.7)
Neutrophils Relative %: 66 %
Platelets: 209 10*3/uL (ref 150–400)
RBC: 4.15 MIL/uL (ref 3.87–5.11)
RDW: 19 % — ABNORMAL HIGH (ref 11.5–15.5)
WBC: 6.1 10*3/uL (ref 4.0–10.5)
nRBC: 0 % (ref 0.0–0.2)

## 2019-09-21 LAB — MAGNESIUM: Magnesium: 2.4 mg/dL (ref 1.7–2.4)

## 2019-09-21 LAB — GLUCOSE, CAPILLARY
Glucose-Capillary: 222 mg/dL — ABNORMAL HIGH (ref 70–99)
Glucose-Capillary: 217 mg/dL — ABNORMAL HIGH (ref 70–99)
Glucose-Capillary: 143 mg/dL — ABNORMAL HIGH (ref 70–99)
Glucose-Capillary: 157 mg/dL — ABNORMAL HIGH (ref 70–99)

## 2019-09-21 LAB — PHOSPHORUS: Phosphorus: 3.1 mg/dL (ref 2.5–4.6)

## 2019-09-21 LAB — BRAIN NATRIURETIC PEPTIDE: B Natriuretic Peptide: 588 pg/mL — ABNORMAL HIGH (ref 0.0–100.0)

## 2019-09-21 MED ORDER — WARFARIN SODIUM 2.5 MG PO TABS
2.5000 mg | ORAL_TABLET | Freq: Once | ORAL | Status: AC
  Administered 2019-09-21: 2.5 mg via ORAL
  Filled 2019-09-21 (×2): qty 1

## 2019-09-21 NOTE — Progress Notes (Signed)
PROGRESS NOTE    Kimberly Peterson  [id number] DOB: Apr 22, 1930 DOA: 09/14/2019 PCP: [name], MD   Brief Narrative:  Kimberly Peterson  is a 86 y.o. AA female with a known history of CHF, chronic kidney disease, COPD, type 2 diabetes mellitus and hypertension, presented to the emergency room with acute onset of worsening dyspnea for about a week with associated increased oxygen requirement above her baseline of 2 L/min with chronic respiratory failure.  She stated that [name] is not getting her enough diuresis.  She denied any cough or wheezing.  No fever or chills. No nausea or vomiting or abdominal pain.  No dysuria, oliguria or hematuria or flank pain.  Admitted for HF exacerbation.  Improving with IV diuresis.  Will likely need higher dose of torsemide prior to discharge.  Assessment & Plan:   Active Problems:   Acute CHF (congestive heart failure) (HCC)   Acute on chronic diastolic CHF (congestive heart failure) ([place])   Heart failure ([place])   1.  Acute on chronic diastolic CHF with subsequent acute on chronic hypoxic respiratory failure. - continue lasix 80 IV BID (on this previous admission) (on torsemide 40 mg daily at home, may need increased dose?) - echo from 07/12/19 with EF 60-65%, no RWMA -will need outpatient cardiology follow up -We will continue Coreg and hydralazine. - uses 2 L at home, now on 3 L - wean as tolerated - I/O, daily weights - 89 on 3/14 (was 84.2 kg on 1/9).  Net negative 3.9 L.   - CXR 3/12 without substantial change, low volume film with vascular congestion and R base collapse/consolidation with small R pleural effusion - CXR 3/13 with R> L effusions with associated atelectasis vs pneumonia  (low suspicon for pneumonia without WBC count or fever, continue to monitor)  - CXR 3/14 without significant change, small effusions with atelectasis - CXR 3/15 with small R effusion - LE [place] negative for DVT  Trop elevation 2/2 demand ischemia - Troponins  elevated, but flat (similar to prior values), not suggestive of ACS - EKG appears similar to priors  2.  Coagulopathy secondary to Coumadin, for history of atrial fibrillation.  Patient is status post aortic valve replacement.  S/p Pacemaker placement. -warfarin per pharmacy -> supratherapeutic today, on hold  3.  Acute kidney injury superimposed on stage IIIb chronic kidney disease. - baseline creatinine 1.10, 1.52 at presentation, likely 2/2 HF - fluctuating with diuresis, follow  4.  Hypertension. -We will continue Coreg, hydralazine and Cartia XT.  5.  Type II diabetes mellitus  Hypoglycemia - Continue 70/30 and SSI - adjust as needed  6.  GERD. -PPI therapy will be resumed.  7.  Dyslipidemia. -We will continue statin therapy.  OSA: cpap  DVT prophylaxis: warfarin Code Status: DNR Family Communication:  Disposition Plan: Home after pt's adequately diuresed and breathing back to baseline.    Consultants:   none  Procedures:   none  Antimicrobials:  Anti-infectives (From admission, onward)   None     Subjective: Pt reported dyspnea improved, but breathing not back to baseline.  Has baseline LE swelling that's stable.  Reported making a lot of urine.  No fever, chest pain, abdominal pain, N/V/D, dysuria.   Objective: Vitals:   09/21/19 0757 09/21/19 0815 09/21/19 1201 09/21/19 1551  BP: (!) 139/52  (!) 131/56 (!) 119/57  Pulse: 65 64 61 64  Resp: 19 16 19 18   Temp: 98.6 F (37 C)  98.3 F (36.8 C) 98.3  F (36.8 C)  TempSrc: Oral  Oral Oral  SpO2: 92% 91% 92% 92%  Weight:      Height:        Intake/Output Summary (Last 24 hours) at 09/21/2019 1604 Last data filed at 09/21/2019 1347 Gross per 24 hour  Intake 600 ml  Output 300 ml  Net 300 ml   Filed Weights   09/20/19 0012 09/20/19 0323 09/21/19 0510  Weight: 89 kg 89 kg 87.5 kg    Examination:  Constitutional: NAD, AAOx3, sitting up in chair HEENT: conjunctivae and lids normal,  EOMI CV: RRR no M,R,G. Distal pulses +2.  No cyanosis.   RESP: reduced lung sounds, normal respiratory effort, on 2-3L GI: +BS, NTND Extremities: lumpy areas of swelling in BLE, R>L, which pt said were baseline. SKIN: warm, dry and intact Neuro: II - XII grossly intact.  Sensation intact Psych: Normal mood and affect.  Appropriate judgement and reason   Data Reviewed: I have personally reviewed following labs and imaging studies  CBC: Recent Labs  Lab 09/17/19 0636 09/18/19 0543 09/19/19 0342 09/20/19 0433 09/21/19 0557  WBC 5.9 6.3 5.8 5.5 6.1  NEUTROABS 4.0 4.1 3.9 3.3 4.0  HGB 9.9* 10.7* 9.6* 10.3* 9.9*  HCT 34.4* 36.8 34.1* 34.5* 33.5*  MCV 83.1 82.3 83.6 80.4 80.7  PLT 211 225 208 211 [id number]   Basic Metabolic Panel: Recent Labs  Lab 09/17/19 0636 09/18/19 0543 09/19/19 0342 09/20/19 0433 09/21/19 0557  NA 141 142 142 141 142  K 3.8 3.7 3.9 4.0 3.8  CL 101 101 99 97* 98  CO2 30 35* 32 32 32  GLUCOSE 186* 150* 169* 218* 228*  BUN 32* 30* 32* 34* 35*  CREATININE 1.41* 1.19* 1.31* 1.33* 1.36*  CALCIUM 8.7* 9.0 8.9 8.9 8.8*  MG 2.4 2.3 2.3 2.4 2.4  PHOS 4.1 3.7 4.1 3.4 3.1   GFR: Estimated Creatinine Clearance: 30 mL/min (A) (by C-G formula based on SCr of 1.36 mg/dL (H)). Liver Function Tests: Recent Labs  Lab 09/17/19 0636 09/18/19 0543 09/19/19 0342 09/20/19 0433 09/21/19 0557  AST 30 25 21 20 17   ALT 21 20 16 15 14   ALKPHOS 76 81 78 75 70  BILITOT 0.8 0.8 0.6 0.8 0.9  PROT 7.5 7.6 7.2 7.4 7.1  ALBUMIN 3.5 3.5 3.3* 3.3* 3.1*   No results for input(s): LIPASE, AMYLASE in the last 168 hours. No results for input(s): AMMONIA in the last 168 hours. Coagulation Profile: Recent Labs  Lab 09/17/19 0636 09/18/19 0543 09/19/19 0342 09/20/19 0433 09/21/19 0557  INR 2.8* 3.4* 4.1* 3.4* 2.8*   Cardiac Enzymes: No results for input(s): CKTOTAL, CKMB, CKMBINDEX, TROPONINI in the last 168 hours. BNP (last 3 results) No results for input(s): PROBNP in the  last 8760 hours. HbA1C: No results for input(s): HGBA1C in the last 72 hours. CBG: Recent Labs  Lab 09/20/19 1126 09/20/19 1653 09/20/19 2059 09/21/19 0800 09/21/19 1159  GLUCAP 285* 316* 209* 222* 217*   Lipid Profile: No results for input(s): CHOL, HDL, LDLCALC, TRIG, CHOLHDL, LDLDIRECT in the last 72 hours. Thyroid Function Tests: No results for input(s): TSH, T4TOTAL, FREET4, T3FREE, THYROIDAB in the last 72 hours. Anemia Panel: No results for input(s): VITAMINB12, FOLATE, FERRITIN, TIBC, IRON, RETICCTPCT in the last 72 hours. Sepsis Labs: No results for input(s): PROCALCITON, LATICACIDVEN in the last 168 hours.  Recent Results (from the past 240 hour(s))  SARS CORONAVIRUS 2 (TAT 6-24 HRS) Nasopharyngeal Nasopharyngeal Swab     Status: None  Collection Time: 09/15/19 12:31 AM   Specimen: Nasopharyngeal Swab  Result Value Ref Range Status   SARS Coronavirus 2 NEGATIVE NEGATIVE Final    Comment: (NOTE) SARS-CoV-2 target nucleic acids are NOT DETECTED. The SARS-CoV-2 RNA is generally detectable in upper and lower respiratory specimens during the acute phase of infection. Negative results do not preclude SARS-CoV-2 infection, do not rule out co-infections with other pathogens, and should not be used as the sole basis for treatment or other patient management decisions. Negative results must be combined with clinical observations, patient history, and epidemiological information. The expected result is Negative. Fact Sheet for Patients: SugarRoll.be Fact Sheet for Healthcare Providers: https://www.woods-mathews.com/ This test is not yet approved or cleared by the [place] FDA and  has been authorized for detection and/or diagnosis of SARS-CoV-2 by FDA under an Emergency Use Authorization (EUA). This EUA will remain  in effect (meaning this test can be used) for the duration of the COVID-19 declaration under Section 56 4(b)(1)  of the Act, 21 U.S.C. section 360bbb-3(b)(1), unless the authorization is terminated or revoked sooner. Performed at [facility], [address], [place], [place] [address]          Radiology Studies: DG Chest Port 1 View  Result Date: 09/21/2019 CLINICAL DATA:  Hypoxia. EXAM: PORTABLE CHEST 1 VIEW COMPARISON:  09/19/2019 FINDINGS: There is a left chest wall pacer device with lead in the right atrial appendage and right ventricle. Previous median sternotomy and CABG procedure. Mild cardiac enlargement. Unchanged bilateral pleural effusions and mild pulmonary edema. IMPRESSION: No change in CHF pattern. Electronically Signed   By: Kerby Moors M.D.   On: 09/21/2019 09:49        Scheduled Meds: . aspirin EC  81 mg Oral Daily  . atorvastatin  10 mg Oral Daily  . carvedilol  6.25 mg Oral BID WC  . diltiazem  240 mg Oral Daily  . furosemide  80 mg Intravenous BID  . hydrALAZINE  37.5 mg Oral TID  . insulin aspart  0-15 Units Subcutaneous TID WC  . insulin aspart  0-5 Units Subcutaneous QHS  . insulin aspart protamine- aspart  14 Units Subcutaneous BID WC  . ipratropium-albuterol  3 mL Nebulization TID  . pantoprazole  40 mg Oral Daily  . polyethylene glycol  17 g Oral BID  . sodium chloride flush  3 mL Intravenous Q12H  . warfarin  2.5 mg Oral ONCE-1800  . Warfarin - Pharmacist Dosing Inpatient   Does not apply q1800   Continuous Infusions: . sodium chloride       LOS: 6 days    Enzo Bi, MD [facility]   To contact the attending provider between 7A-7P or the covering provider during after hours 7P-7A, please log into the web site [email or web address] and access using universal [place] password for that web site. If you do not have the password, please call the hospital operator.  09/21/2019, 4:04 PM

## 2019-09-21 NOTE — Consult Note (Addendum)
[place] for Warfarin Indication: atrial fibrillation  No Known Allergies  Patient Measurements: Height: 5\' 2"  (157.5 cm) Weight: 193 lb (87.5 kg) IBW/kg (Calculated) : 50.1   Vital Signs: Temp: 98.6 F (37 C) (03/17 0757) Temp Source: Oral (03/17 0757) BP: 139/52 (03/17 0757) Pulse Rate: 64 (03/17 0815)  Labs: Recent Labs    09/19/19 0342 09/19/19 0342 09/20/19 0433 09/21/19 0557  HGB 9.6*   < > 10.3* 9.9*  HCT 34.1*  --  34.5* 33.5*  PLT 208  --  211 209  LABPROT 39.9*  --  34.3* 29.2*  INR 4.1*  --  3.4* 2.8*  CREATININE 1.31*  --  1.33* 1.36*   < > = values in this interval not displayed.    Estimated Creatinine Clearance: 30 mL/min (A) (by C-G formula based on SCr of 1.36 mg/dL (H)).   Medications:  Warfarin 7.5 mg Thursday and 5 mg all other days- confirmed with patient dose and last dose was 3/10 (evening)  Assessment: Kimberly Peterson  is a 86 y.o. female with a known history of Afib (on warfarin), CHF, CKD, COPD, T2DM, HTN who presented to the ED with acute onset of worsening dyspnea for about a week. Pharmacy consulted to manage warfarin inpatient.   Date  INR  Dose  3/10  2.5  5 mg (patient reported) 3/11  2.8  7.5 mg 3/12  2.6  5 mg 3/13  2.8  5 mg 3/14  3.4                    Held 3/15                 4.1                    Held 3/16                 3.4                    Held 3/17  2.8    Today's INR: therapeutic  H&H, platelets stable   DDI: diltiazem, aspirin.  Goal of Therapy:  INR 2-3 Monitor platelets by anticoagulation protocol: Yes   Plan:  - Warfarin 2.5 mg  - Daily INR monitoring - CBC per protocol     Artis Delay, PharmD, BCPS 09/21/2019,8:20 AM

## 2019-09-21 NOTE — Progress Notes (Signed)
PT Cancellation Note  Patient Details Name: Kimberly Peterson MRN: [id number] DOB: 07/27/1929   Cancelled Treatment:     PT attempt. Pt was asleep in recliner upon arriving. She easily awakes however refuses to participate in PT at this time. " I just don't want to, I'm too tired." PT will continue to follow per POC and progress as able per pt tolerance.    Willette Pa 09/21/2019, 3:42 PM

## 2019-09-22 ENCOUNTER — Ambulatory Visit: Payer: Medicare Other | Admitting: Family

## 2019-09-22 ENCOUNTER — Telehealth: Payer: Self-pay

## 2019-09-22 ENCOUNTER — Other Ambulatory Visit: Payer: Self-pay

## 2019-09-22 DIAGNOSIS — J449 Chronic obstructive pulmonary disease, unspecified: Secondary | ICD-10-CM

## 2019-09-22 LAB — CBC
HCT: 36.4 % (ref 36.0–46.0)
Hemoglobin: 10.7 g/dL — ABNORMAL LOW (ref 12.0–15.0)
MCH: 24 pg — ABNORMAL LOW (ref 26.0–34.0)
MCHC: 29.4 g/dL — ABNORMAL LOW (ref 30.0–36.0)
MCV: 81.8 fL (ref 80.0–100.0)
Platelets: 212 10*3/uL (ref 150–400)
RBC: 4.45 MIL/uL (ref 3.87–5.11)
RDW: 18.9 % — ABNORMAL HIGH (ref 11.5–15.5)
WBC: 6.4 10*3/uL (ref 4.0–10.5)
nRBC: 0 % (ref 0.0–0.2)

## 2019-09-22 LAB — BASIC METABOLIC PANEL
Anion gap: 11 (ref 5–15)
BUN: 35 mg/dL — ABNORMAL HIGH (ref 8–23)
CO2: 31 mmol/L (ref 22–32)
Calcium: 9.2 mg/dL (ref 8.9–10.3)
Chloride: 99 mmol/L (ref 98–111)
Creatinine, Ser: 1.5 mg/dL — ABNORMAL HIGH (ref 0.44–1.00)
GFR calc Af Amer: 36 mL/min — ABNORMAL LOW (ref >60)
GFR calc non Af Amer: 31 mL/min — ABNORMAL LOW (ref >60)
Glucose, Bld: 191 mg/dL — ABNORMAL HIGH (ref 70–99)
Potassium: 3.8 mmol/L (ref 3.5–5.1)
Sodium: 141 mmol/L (ref 135–145)

## 2019-09-22 LAB — MAGNESIUM: Magnesium: 2.4 mg/dL (ref 1.7–2.4)

## 2019-09-22 LAB — PROTIME-INR
INR: 1.9 — ABNORMAL HIGH (ref 0.8–1.2)
Prothrombin Time: 22.1 seconds — ABNORMAL HIGH (ref 11.4–15.2)

## 2019-09-22 LAB — GLUCOSE, CAPILLARY
Glucose-Capillary: 211 mg/dL — ABNORMAL HIGH (ref 70–99)
Glucose-Capillary: 195 mg/dL — ABNORMAL HIGH (ref 70–99)

## 2019-09-22 MED ORDER — HYDRALAZINE HCL 25 MG PO TABS: 37.5000 mg | ORAL_TABLET | Freq: Three times a day (TID) | ORAL | Status: DC

## 2019-09-22 MED ORDER — INSULIN ASPART PROT & ASPART (70-30 MIX) 100 UNIT/ML ~~LOC~~ SUSP: 20.0000 [IU] | Freq: Two times a day (BID) | SUBCUTANEOUS | Status: DC

## 2019-09-22 NOTE — Care Management Important Message (Signed)
Important Message  Patient Details  Name: Kimberly Peterson MRN: [id number] Date of Birth: 07-Jan-1930   Medicare Important Message Given:  Yes     Dannette Barbara 09/22/2019, 1:17 PM

## 2019-09-22 NOTE — Discharge Summary (Signed)
Physician Discharge Summary   Kimberly Peterson  female DOB: 23-Oct-1929  [id number]  PCP: [name], MD  Admit date: 09/14/2019 Discharge date: 09/22/2019  Admitted From: home Disposition:  Home.  Daughter updated on the phone about discharge plans prior to discharge.  Home Health: Yes CODE STATUS: DNR  Discharge Instructions    AMB referral to CHF clinic   Complete by: As directed    AMB referral to pulmonary rehabilitation   Complete by: As directed    Please select a program: Respiratory Care Services   Respiratory Care Services Diagnosis: Heart Failure   After initial evaluation and assessments completed: Virtual Based Care may be provided alone or in conjunction with Pulmonary Rehab/Respiratory Care services based on patient barriers.: Yes   Discharge instructions   Complete by: As directed    When you walk, turn your oxygen up to 6 liters.  When at rest, keep it at 2 or 3 liters.  Please follow up with your PCP to monitor your oxygen requirement.  Don't drink too much fluids, drink only when you are thirsty.  Eat low salt foods.   Dr. Enzo Bi - -   Increase activity slowly   Complete by: As directed        Hospital Course:  For full details, please see H&P, progress notes, consult notes and ancillary notes.  Briefly,  DorisJacksonis a55 y.o.AA femalewith a known history ofCHF, chronic kidney disease, COPD, type 2 diabetes mellitus and hypertension, presented to the emergency room with acute onset of worsening dyspneafor about a weekwith associated increased oxygen requirement above her baseline of 2 L/min. She stated that [name] is not getting her enough diuresis. She denied any cough or wheezing. No fever or chills.   Active Problems:   Acute CHF (congestive heart failure) (HCC)   Acute on chronic diastolic CHF (congestive heart failure) ([place])   Heart failure ([place])   1. Acute on chronic diastolic CHFwith subsequent acute on chronic  hypoxic respiratory failure. Pt presented with tachypnia and was initially put on nonrebreather, and then 4L O2.  BNP was 603.  Pt was diuresed initially with lasix 40 IV BID for the first 3 days, then increased to IV lasix 80 mg BID for 5 more days.  Pt required continued IV diuresis due to slow improvement in dyspnea and not being able to be weaned down on supplemental O2, but after 8 days of aggressive IV diuresis, it appeared pt was at a new baseline, and her dyspnea and increased hypoxia on exertion were largely due to de-conditioning.  PT walked with pt prior to discharge and found pt needed O2 turned up to 6L while walking, but sating in 90's at rest with 2-3L O2.  Pt had echo from 07/12/19 with EF 60-65%, no RWMA.  Home continued Coreg and hydralazine, and discharged back on home torsemide 40 mg daily.  Pt will follow up with cardiology as outpatient.  Trop elevation 2/2 demand ischemia - Troponins elevated, but flat (60's), not suggestive of ACS. EKG appears similar to priors  2. history of atrial fibrillation on warfarin S/p aortic valve replacement.   S/p Pacemaker placement. Continued home Cartia, coreg and warfarin.  3. Acute kidney injury, ruled out CKD stage IIIb  Cr baseline appeared to be more between 1.2-1.4.  On the day of discharge. Cr 1.5.  4. Hypertension. Continued home Coreg, hydralazine and Cartia XT.  5. Type II diabetes mellitus  Hypoglycemia Continued home insulin regimen and SSI.  6. GERD. Continued home PPI   7. Dyslipidemia. Continued statin therapy.  OSA: cpap   Discharge Diagnoses:  Active Problems:   Acute CHF (congestive heart failure) (HCC)   Acute on chronic diastolic CHF (congestive heart failure) (HCC)   Heart failure ([place])    Discharge Instructions:  Allergies as of 09/22/2019   No Known Allergies     Medication List    TAKE these medications   aspirin 81 MG tablet Take 81 mg by mouth daily.   atorvastatin 10 MG  tablet Commonly known as: LIPITOR TAKE 1 TABLET BY MOUTH ONCE DAILY FOR CHOLESTEROL What changed:   how much to take  how to take this  when to take this  additional instructions   Cartia XT 240 MG 24 hr capsule Generic drug: diltiazem Take 1 capsule by mouth once daily What changed: how much to take   carvedilol 6.25 MG tablet Commonly known as: COREG Take 6.25 mg by mouth 2 (two) times daily with a meal.   hydrALAZINE 25 MG tablet Commonly known as: APRESOLINE Take 1.5 tablets (37.5 mg total) by mouth 3 (three) times daily. What changed: See the new instructions.   insulin aspart protamine- aspart (70-30) 100 UNIT/ML injection Commonly known as: NOVOLOG MIX 70/30 Inject 0.2 mLs (20 Units total) into the skin 2 (two) times daily with a meal.   pantoprazole 40 MG tablet Commonly known as: PROTONIX Take one tab po qd for stomach What changed:   how much to take  how to take this  when to take this  additional instructions   torsemide 20 MG tablet Commonly known as: [name] Take 2 tablets (40 mg total) by mouth daily.   warfarin 5 MG tablet Commonly known as: COUMADIN Take 1-1.5 tablets (5-7.5 mg total) by mouth See admin instructions. Take 1 tablet (5mg ) by mouth every Monday, Tuesday. Wednesday, Friday, Saturday and Sunday evening and take 1 tablets (7.5mg) by mouth every Thursday evening            Durable Medical Equipment  (From admission, onward)         Start     Ordered   09/22/19 1148  For home use only DME oxygen  Once    Comments: 6L with walking, 2-3L at rest.  Question Answer Comment  Length of Need 6 Months   Liters per Minute 6   Oxygen delivery system Gas      09/22/19 1148   09/20/19 0955  For home use only DME 3 n 1  Once     03 /16/21 0954          Follow-up Information    [facility] Follow up on 09/22/2019.   Specialty: Cardiology Why: at 10:30am. Enter through the [facility]  entrance Contact information: [place] [phone]       [name], MD. Schedule an appointment as soon as possible for a visit in 1 week(s).   Specialty: Internal Medicine Contact information: [place] [address] [phone]           No Known Allergies   The results of significant diagnostics from this hospitalization (including imaging, microbiology, ancillary and laboratory) are listed below for reference.   Consultations:   Procedures/Studies: US Venous Img Lower Bilateral (DVT)  Result Date: 09/18/2019 CLINICAL DATA:  Lower extremity edema EXAM: BILATERAL LOWER EXTREMITY VENOUS DOPPLER ULTRASOUND TECHNIQUE: Gray-scale sonography with graded compression, as  well as color Doppler and duplex ultrasound were performed to evaluate the lower extremity deep venous systems from the level of the common femoral vein and including the common femoral, femoral, profunda femoral, popliteal and calf veins including the posterior tibial, peroneal and gastrocnemius veins when visible. The superficial great saphenous vein was also interrogated. Spectral Doppler was utilized to evaluate flow at rest and with distal augmentation maneuvers in the common femoral, femoral and popliteal veins. COMPARISON:  None. FINDINGS: RIGHT LOWER EXTREMITY Common Femoral Vein: No evidence of thrombus. Normal compressibility, respiratory phasicity and response to augmentation. Saphenofemoral Junction: No evidence of thrombus. Normal compressibility and flow on color Doppler imaging. Profunda Femoral Vein: No evidence of thrombus. Normal compressibility and flow on color Doppler imaging. Femoral Vein: No evidence of thrombus. Normal compressibility, respiratory phasicity and response to augmentation. Popliteal Vein: No evidence of thrombus. Normal compressibility, respiratory phasicity and response to augmentation. Calf Veins: No evidence  of thrombus. Normal compressibility and flow on color Doppler imaging. LEFT LOWER EXTREMITY Common Femoral Vein: No evidence of thrombus. Normal compressibility, respiratory phasicity and response to augmentation. Saphenofemoral Junction: No evidence of thrombus. Normal compressibility and flow on color Doppler imaging. Profunda Femoral Vein: No evidence of thrombus. Normal compressibility and flow on color Doppler imaging. Femoral Vein: No evidence of thrombus. Normal compressibility, respiratory phasicity and response to augmentation. Popliteal Vein: No evidence of thrombus. Normal compressibility, respiratory phasicity and response to augmentation. Calf Veins: No evidence of thrombus. Normal compressibility and flow on color Doppler imaging. IMPRESSION: No evidence of deep venous thrombosis in either lower extremity. Electronically Signed   By: Jerilynn Mages.  Shick M.D.   On: 09/18/2019 10:56   DG Chest Port 1 View  Result Date: 09/21/2019 CLINICAL DATA:  Hypoxia. EXAM: PORTABLE CHEST 1 VIEW COMPARISON:  09/19/2019 FINDINGS: There is a left chest wall pacer device with lead in the right atrial appendage and right ventricle. Previous median sternotomy and CABG procedure. Mild cardiac enlargement. Unchanged bilateral pleural effusions and mild pulmonary edema. IMPRESSION: No change in CHF pattern. Electronically Signed   By: Kerby Moors M.D.   On: 09/21/2019 09:49   DG Chest Port 1 View  Result Date: 09/19/2019 CLINICAL DATA:  Hypoxia. EXAM: PORTABLE CHEST 1 VIEW COMPARISON:  September 18, 2019. FINDINGS: Stable cardiomegaly. Sternotomy wires are noted. Left-sided pacemaker is unchanged in position. No pneumothorax is noted. Small right pleural effusion is noted with associated subsegmental atelectasis. Bony thorax is unremarkable. IMPRESSION: Small right pleural effusion with associated subsegmental atelectasis. Electronically Signed   By: Marijo Conception M.D.   On: 09/19/2019 08:09   DG Chest Port 1 View  Result  Date: 09/18/2019 CLINICAL DATA:  Hypoxia per physician order. Pt admitted 09/14/2019 with worsening SOB and weight gain. Hx - CHF, CKD, COPD, DM, HTN, pacemaker, aortic valve replacement, non-smoker. EXAM: PORTABLE CHEST 1 VIEW COMPARISON:  09/17/2019 FINDINGS: Stable cardiomegaly and changes from prior cardiac surgery. Mild hazy opacity at the lung bases, greater on the right, consistent with small effusions. Stable vascular congestion without overt pulmonary edema. No pneumothorax. IMPRESSION: 1. No significant change from the previous day's exam. 2. Right greater than left lung base opacities consistent with small effusions with atelectasis. No convincing pneumonia and no overt pulmonary edema. Electronically Signed   By: Lajean Manes M.D.   On: 09/18/2019 08:39   DG Chest Port 1 View  Result Date: 09/17/2019 CLINICAL DATA:  Hypoxia per physician order. Pt admitted 09/14/2019 for acute CHF and acute on chronic diastolic  CH. Hx - CHF, COPD, DM, HTN, aortic valve replacement, pacemaker. EXAM: PORTABLE CHEST 1 VIEW COMPARISON:  09/16/2019 FINDINGS: Persistent lung base opacities, greater on the right, consistent with pleural effusions with atelectasis and/or pneumonia. Persistent vascular congestion without overt edema. Stable mild cardiomegaly and changes from prior cardiac surgery. No pneumothorax. Left anterior chest wall pacemaker is stable. IMPRESSION: 1. No significant change from the previous day's study. 2. Small, right greater than left, pleural effusions with associated atelectasis and/or pneumonia. Electronically Signed   By: Lajean Manes M.D.   On: 09/17/2019 09:23   DG Chest Port 1 View  Result Date: 09/16/2019 CLINICAL DATA:  Hypoxia. EXAM: PORTABLE CHEST 1 VIEW COMPARISON:  09/14/2019 FINDINGS: 0948 hours. Low lung volumes with rightward patient rotation. The cardio pericardial silhouette is enlarged. There is pulmonary vascular congestion without overt pulmonary edema. Right base  collapse/consolidation with small right pleural effusion is similar to prior given differential positioning. Left permanent pacemaker evident. Telemetry leads overlie the chest. IMPRESSION: No substantial change. Low volume film with vascular congestion and right base collapse/consolidation with small right pleural effusion. Electronically Signed   By: Misty Stanley M.D.   On: 09/16/2019 10:00   DG Chest Port 1 View  Result Date: 09/14/2019 CLINICAL DATA:  Shortness of breath EXAM: PORTABLE CHEST 1 VIEW COMPARISON:  07/11/2019 FINDINGS: Cardiac shadow is enlarged. Postsurgical changes and pacing device are again seen. Persistent vascular congestion is noted but mildly improved when compared with prior exam. Mild interstitial edema is seen. No bony abnormality is noted. IMPRESSION: Changes of mild CHF slightly improved when compared with the prior exam. Electronically Signed   By: Inez Catalina M.D.   On: 09/14/2019 21:43      Labs: BNP (last 3 results) Recent Labs    09/19/19 0342 09/20/19 0433 09/21/19 0557  BNP 647.0* 527.0* [id number]*   Basic Metabolic Panel: Recent Labs  Lab 09/17/19 0636 09/17/19 0636 09/18/19 0543 09/19/19 0342 09/20/19 0433 09/21/19 0557 09/22/19 0531  NA 141   < > 142 142 141 142 141  K 3.8   < > 3.7 3.9 4.0 3.8 3.8  CL 101   < > 101 99 97* 98 99  CO2 30   < > 35* 32 32 32 31  GLUCOSE 186*   < > 150* 169* 218* 228* 191*  BUN 32*   < > 30* 32* 34* 35* 35*  CREATININE 1.41*   < > 1.19* 1.31* 1.33* 1.36* 1.50*  CALCIUM 8.7*   < > 9.0 8.9 8.9 8.8* 9.2  MG 2.4   < > 2.3 2.3 2.4 2.4 2.4  PHOS 4.1  --  3.7 4.1 3.4 3.1  --    < > = values in this interval not displayed.   Liver Function Tests: Recent Labs  Lab 09/17/19 0636 09/18/19 0543 09/19/19 0342 09/20/19 0433 09/21/19 0557  AST 30 25 21 20 17   ALT 21 20 16 15 14   ALKPHOS 76 81 78 75 70  BILITOT 0.8 0.8 0.6 0.8 0.9  PROT 7.5 7.6 7.2 7.4 7.1  ALBUMIN 3.5 3.5 3.3* 3.3* 3.1*   No results for  input(s): LIPASE, AMYLASE in the last 168 hours. No results for input(s): AMMONIA in the last 168 hours. CBC: Recent Labs  Lab 09/17/19 0636 09/17/19 0636 09/18/19 0543 09/19/19 0342 09/20/19 0433 09/21/19 0557 09/22/19 0531  WBC 5.9   < > 6.3 5.8 5.5 6.1 6.4  NEUTROABS 4.0  --  4.1 3.9 3.3 4.0  --  HGB 9.9*   < > 10.7* 9.6* 10.3* 9.9* 10.7*  HCT 34.4*   < > 36.8 34.1* 34.5* 33.5* 36.4  MCV 83.1   < > 82.3 83.6 80.4 80.7 81.8  PLT 211   < > 225 208 211 209 212   < > = values in this interval not displayed.   Cardiac Enzymes: No results for input(s): CKTOTAL, CKMB, CKMBINDEX, TROPONINI in the last 168 hours. BNP: Invalid input(s): POCBNP CBG: Recent Labs  Lab 09/21/19 1159 09/21/19 1648 09/21/19 2100 09/22/19 0758 09/22/19 1130  GLUCAP 217* 143* 157* 211* 195*   D-Dimer No results for input(s): DDIMER in the last 72 hours. Hgb A1c No results for input(s): HGBA1C in the last 72 hours. Lipid Profile No results for input(s): CHOL, HDL, LDLCALC, TRIG, CHOLHDL, LDLDIRECT in the last 72 hours. Thyroid function studies No results for input(s): TSH, T4TOTAL, T3FREE, THYROIDAB in the last 72 hours.  Invalid input(s): FREET3 Anemia work up No results for input(s): VITAMINB12, FOLATE, FERRITIN, TIBC, IRON, RETICCTPCT in the last 72 hours. Urinalysis    Component Value Date/Time   COLORURINE YELLOW (A) 07/11/2019 1242   APPEARANCEUR CLEAR (A) 07/11/2019 1242   APPEARANCEUR Clear 06/09/2018 1502   LABSPEC 1.013 07/11/2019 1242   LABSPEC 1.012 09/07/2013 1418   PHURINE 5.0 07/11/2019 1242   GLUCOSEU NEGATIVE 07/11/2019 1242   GLUCOSEU Negative 09/07/2013 1418   HGBUR NEGATIVE 07/11/2019 1242   BILIRUBINUR NEGATIVE 07/11/2019 1242   BILIRUBINUR Negative 06/09/2018 1502   BILIRUBINUR Negative 09/07/2013 1418   KETONESUR NEGATIVE 07/11/2019 1242   PROTEINUR 100 (A) 07/11/2019 1242   UROBILINOGEN 0.2 11/21/2013 1253   NITRITE NEGATIVE 07/11/2019 1242   LEUKOCYTESUR  NEGATIVE 07/11/2019 1242   LEUKOCYTESUR 3+ 09/07/2013 1418   Sepsis Labs Invalid input(s): PROCALCITONIN,  WBC,  LACTICIDVEN Microbiology Recent Results (from the past 240 hour(s))  SARS CORONAVIRUS 2 (TAT 6-24 HRS) Nasopharyngeal Nasopharyngeal Swab     Status: None   Collection Time: 09/15/19 12:31 AM   Specimen: Nasopharyngeal Swab  Result Value Ref Range Status   SARS Coronavirus 2 NEGATIVE NEGATIVE Final    Comment: (NOTE) SARS-CoV-2 target nucleic acids are NOT DETECTED. The SARS-CoV-2 RNA is generally detectable in upper and lower respiratory specimens during the acute phase of infection. Negative results do not preclude SARS-CoV-2 infection, do not rule out co-infections with other pathogens, and should not be used as the sole basis for treatment or other patient management decisions. Negative results must be combined with clinical observations, patient history, and epidemiological information. The expected result is Negative. Fact Sheet for Patients: SugarRoll.be Fact Sheet for Healthcare Providers: https://www.woods-mathews.com/ This test is not yet approved or cleared by the [place] FDA and  has been authorized for detection and/or diagnosis of SARS-CoV-2 by FDA under an Emergency Use Authorization (EUA). This EUA will remain  in effect (meaning this test can be used) for the duration of the COVID-19 declaration under Section 56 4(b)(1) of the Act, 21 U.S.C. section 360bbb-3(b)(1), unless the authorization is terminated or revoked sooner. Performed at [facility], [place] [address], [place], [place] [address]      Total time spend on discharging this patient, including the last patient exam, discussing the hospital stay, instructions for ongoing care as it relates to all pertinent caregivers, as well as preparing the medical discharge records, prescriptions, and/or referrals as applicable, is 40 minutes.    Enzo Bi, MD  [facility] 09/22/2019, 11:52 AM  If 7PM-7AM, please  contact night-coverage

## 2019-09-22 NOTE — Progress Notes (Signed)
Patient discharged to home.  Tele and IV d/c'd. Patient and daughter verbalize understanding of discharge instructions.

## 2019-09-22 NOTE — Progress Notes (Signed)
Physical Therapy Treatment Patient Details Name: Kimberly Peterson MRN: [id number] DOB: March 27, 1930 Today's Date: 09/22/2019    History of Present Illness 86 year old female admitted with SOB. Acute on chronic diastolic heart failure. PMH includes: COPD, PE, HTN, HLD, Afib, DM.    PT Comments    Pt was seated in recliner upon arriving on 4 L o2. She reports she felt better previous date but was cooperative with encouragement. She requested to use BR and did have a BM/ urintated 300 MLs. She reports no pain and was able to follow commands throughout. Pt was able to stand with  RW+ CGA for  Safety. She ambulated to doorway of room with sao2 desat to 81%. She required increase in O2 supply to 6 L to return to recliner. After 2 minutes seated in recliner on 6 L sao2 > 90 % and was able to wean back to 4 L with sao2 92% at conclusion of session. Overall pt is limited by fatigue. Therapist discussed with pt D/C disposition and safety concerns with D/C to home. She reports she will have her daughter with her at home and feels she can safely manage at home.  Pt was resting comfortably in recliner post session with call bell in reach, and BLEs elevated. PT will continue to follow per POC and progress as able.    Follow Up Recommendations  Home health PT;Supervision/Assistance - 24 hour;Supervision for mobility/OOB     Equipment Recommendations  3in1 (PT)    Recommendations for Other Services       Precautions / Restrictions Precautions Precautions: Fall Restrictions Weight Bearing Restrictions: No    Mobility  Bed Mobility               General bed mobility comments: Pt was seated in recliner pre/post session  Transfers Overall transfer level: Needs assistance Equipment used: Rolling walker (2 wheeled) Transfers: Sit to/from Stand Sit to Stand: Min guard         General transfer comment: CGA + increased time to stand from recliner. 4 L o2 throughout session. She was able to stand  form BSc as well with CGA. minimal cues for proper handplacement.  Ambulation/Gait Ambulation/Gait assistance: Min guard Gait Distance (Feet): 25 Feet Assistive device: Rolling walker (2 wheeled) Gait Pattern/deviations: WFL(Within Functional Limits) Gait velocity: decraesed   General Gait Details: Pt was able to ambulate to doorway of room with RW + very slow cadnce. she has desat to 81 % and required increased oxygen support to return to recliner. aftersitting in recliner x 2 minutes o2 > 90 % and was able to wean back to 4 L.   Stairs             Wheelchair Mobility    Modified Rankin (Stroke Patients Only)       Balance                                            Cognition Arousal/Alertness: Awake/alert Behavior During Therapy: WFL for tasks assessed/performed Overall Cognitive Status: Within Functional Limits for tasks assessed                                 General Comments: Pt was A and oriented and able tio follow commands consistantly throughout.      Exercises  General Comments        Pertinent Vitals/Pain Pain Assessment: No/denies pain    Home Living                      Prior Function            PT Goals (current goals can now be found in the care plan section) Acute Rehab PT Goals Patient Stated Goal: go home Progress towards PT goals: Progressing toward goals    Frequency    Min 2X/week      PT Plan Current plan remains appropriate    Co-evaluation              AM-PAC PT "6 Clicks" Mobility   Outcome Measure  Help needed turning from your back to your side while in a flat bed without using bedrails?: A Little Help needed moving from lying on your back to sitting on the side of a flat bed without using bedrails?: A Little Help needed moving to and from a bed to a chair (including a wheelchair)?: A Little Help needed standing up from a chair using your arms (e.g., wheelchair  or bedside chair)?: A Little Help needed to walk in hospital room?: A Little Help needed climbing 3-5 steps with a railing? : A Little 6 Click Score: 18    End of Session Equipment Utilized During Treatment: Gait belt;Oxygen;Other (comment) Activity Tolerance: Patient limited by fatigue Patient left: in chair;with call bell/phone within reach;with chair alarm set Nurse Communication: Mobility status PT Visit Diagnosis: Muscle weakness (generalized) (M62.81);Difficulty in walking, not elsewhere classified (R26.2)     Time: [id number] PT Time Calculation (min) (ACUTE ONLY): 18 min  Charges:  $Gait Training: 8-22 mins                    Julaine Fusi PTA 09/22/19, 11:07 AM

## 2019-09-22 NOTE — Consult Note (Signed)
[name] for Warfarin Indication: atrial fibrillation  No Known Allergies  Patient Measurements: Height: 5\' 2"  (157.5 cm) Weight: 190 lb 3.2 oz (86.3 kg) IBW/kg (Calculated) : 50.1   Vital Signs: Temp: 97.8 F (36.6 C) (03/18 0757) Temp Source: Oral (03/18 0757) BP: 142/61 (03/18 0757) Pulse Rate: 65 (03/18 0841)  Labs: Recent Labs    09/20/19 0433 09/20/19 0433 09/21/19 0557 09/22/19 0531  HGB 10.3*   < > 9.9* 10.7*  HCT 34.5*  --  33.5* 36.4  PLT 211  --  209 212  LABPROT 34.3*  --  29.2* 22.1*  INR 3.4*  --  2.8* 1.9*  CREATININE 1.33*  --  1.36* 1.50*   < > = values in this interval not displayed.    Estimated Creatinine Clearance: 26.9 mL/min (A) (by C-G formula based on SCr of 1.5 mg/dL (H)).   Medications:  Warfarin 7.5 mg Thursday and 5 mg all other days- confirmed with patient dose and last dose was 3/10 (evening)  Assessment: Kimberly Peterson  is a 86 y.o. female with a known history of Afib (on warfarin), CHF, CKD, COPD, T2DM, HTN who presented to the ED with acute onset of worsening dyspnea for about a week. Pharmacy consulted to manage warfarin inpatient.   Date  INR  Dose  3/10  2.5  5 mg (patient reported) 3/11  2.8  7.5 mg 3/12  2.6  5 mg 3/13  2.8  5 mg 3/14  3.4                    Held 3/15                 4.1                    Held 3/16                 3.4                    Held 3/17  2.8   2.5 mg 3/18  1.9    Today's INR: subtherapeutic  H&H, platelets stable   DDI: diltiazem, aspirin.  Goal of Therapy:  INR 2-3 Monitor platelets by anticoagulation protocol: Yes   Plan:  - Warfarin 5 mg  - Daily INR monitoring - CBC per protocol     Artis Delay, Student Pharmacist  09/22/2019,8:43 AM

## 2019-09-22 NOTE — Telephone Encounter (Signed)
Pt daughter called that pt discharged today they told her to use nebulizer I gave nebulizer machine and  gave some sample for duoneb also called [facility]elly and also duoneb they can deliver to her house as per [employer]

## 2019-09-23 ENCOUNTER — Telehealth: Payer: Self-pay

## 2019-09-23 NOTE — Telephone Encounter (Signed)
Confirmed virtual visit on 09/27/2019 and screened for covid. [facility]

## 2019-09-25 ENCOUNTER — Other Ambulatory Visit: Payer: Self-pay | Admitting: Internal Medicine

## 2019-09-26 ENCOUNTER — Encounter: Payer: Self-pay | Admitting: Nurse Practitioner

## 2019-09-26 ENCOUNTER — Other Ambulatory Visit: Payer: Self-pay

## 2019-09-26 ENCOUNTER — Ambulatory Visit (INDEPENDENT_AMBULATORY_CARE_PROVIDER_SITE_OTHER): Payer: Medicare Other | Admitting: Nurse Practitioner

## 2019-09-26 ENCOUNTER — Other Ambulatory Visit: Payer: Self-pay | Admitting: *Deleted

## 2019-09-26 VITALS — BP 161/64 | HR 61 | Temp 96.5°F | Resp 16 | Ht 62.0 in | Wt 183.0 lb

## 2019-09-26 DIAGNOSIS — Z794 Long term (current) use of insulin: Secondary | ICD-10-CM | POA: Diagnosis not present

## 2019-09-26 DIAGNOSIS — B372 Candidiasis of skin and nail: Secondary | ICD-10-CM

## 2019-09-26 DIAGNOSIS — R0602 Shortness of breath: Secondary | ICD-10-CM

## 2019-09-26 DIAGNOSIS — E1121 Type 2 diabetes mellitus with diabetic nephropathy: Secondary | ICD-10-CM | POA: Diagnosis not present

## 2019-09-26 DIAGNOSIS — Z09 Encounter for follow-up examination after completed treatment for conditions other than malignant neoplasm: Secondary | ICD-10-CM

## 2019-09-26 DIAGNOSIS — N1832 Chronic kidney disease, stage 3b: Secondary | ICD-10-CM

## 2019-09-26 DIAGNOSIS — Z7901 Long term (current) use of anticoagulants: Secondary | ICD-10-CM

## 2019-09-26 DIAGNOSIS — I5033 Acute on chronic diastolic (congestive) heart failure: Secondary | ICD-10-CM

## 2019-09-26 DIAGNOSIS — K219 Gastro-esophageal reflux disease without esophagitis: Secondary | ICD-10-CM | POA: Diagnosis not present

## 2019-09-26 DIAGNOSIS — E1122 Type 2 diabetes mellitus with diabetic chronic kidney disease: Secondary | ICD-10-CM

## 2019-09-26 MED ORDER — PANTOPRAZOLE SODIUM 40 MG PO TBEC: DELAYED_RELEASE_TABLET | ORAL | 1 refills | Status: AC

## 2019-09-26 MED ORDER — NYSTATIN 100000 UNIT/GM EX OINT: 1.0000 "application " | TOPICAL_OINTMENT | Freq: Two times a day (BID) | CUTANEOUS | 1 refills | Status: AC

## 2019-09-26 NOTE — Patient Outreach (Signed)
[facility]) Care Management  09/26/2019  Kimberly Peterson 08/18/1929 [id number]   Noted that member was admitted to hospital 3/10-3/18 for CHF.  Call placed to member to follow up on discharge, no answer, unable to leave message as phone continues to ring.  Will follow up within the next 3-4 business days.  Valente David, [place], MSN [place] [phone]

## 2019-09-26 NOTE — Progress Notes (Addendum)
[facility] [place], [place] [address]  Internal MEDICINE  Office Visit Note  Patient Name: Kimberly Peterson  [id number]  [id number]  Date of Service: 09/26/2019  Pt is here for recent hospital follow up.   Chief Complaint  Patient presents with  . Hospitalization Follow-up    chf, hypoxia, edema, uncontrolled diabetes     The patient was admitted to hospital 09/14/2019 due to dyspnea. Chest x-ray showed stable CHF, perhaps even a little improved from her hospitalization 07/2019. She was given IV lasix and nebulizer treatments while hospitalized. Was also using her CPAP during the day to help reduce work of breathing. Changes were not made to her medications. Patient continues to have shortness of breath, especially with exertion. She is using oxygen via nasal cannula at 6liters per minute. When she is at home, she uses CPAP, even during the day. Gets short winded, even with short trips to the toilet. She has pitting edema in both lower legs. She wears compression socks every day. This swelling is non tender and improved from the last time she was seen. She continues to be very tired. The patient had a 2D echo on 07/12/2019 and it revealed an EF of 60 to 65%. The patient continues to have little appetite. She is eating very little. Blood sugars continue to be up and down. Higher blood sugar readings are in the evenings. She is taking novolog 70/30 mix at 20 units twice daily. Blood sugars are monitored twice daily. Reviewed blood sugar log with the patient and her daughter. Based on current trend, no changes made today regarding insulin dosing.     Current Medication: Outpatient Encounter Medications as of 09/26/2019  Medication Sig  . aspirin 81 MG tablet Take 81 mg by mouth daily.  Marland Kitchen atorvastatin (LIPITOR) 10 MG tablet TAKE 1 TABLET BY MOUTH ONCE DAILY FOR CHOLESTEROL (Patient taking differently: Take 10 mg by mouth daily. )  . CARTIA XT 240 MG 24 hr capsule Take 1  capsule by mouth once daily (Patient taking differently: Take 240 mg by mouth daily. )  . carvedilol (COREG) 6.25 MG tablet Take 6.25 mg by mouth 2 (two) times daily with a meal.   . hydrALAZINE (APRESOLINE) 25 MG tablet Take 1.5 tablets (37.5 mg total) by mouth 3 (three) times daily.  . insulin aspart protamine- aspart (NOVOLOG MIX 70/30) (70-30) 100 UNIT/ML injection Inject 0.2 mLs (20 Units total) into the skin 2 (two) times daily with a meal.  . ipratropium-albuterol (DUONEB) 0.5-2.5 (3) MG/3ML SOLN Take 3 mLs by nebulization every 6 (six) hours as needed. Dx-shortness of breath  . pantoprazole (PROTONIX) 40 MG tablet TAKE 1 TABLET BY MOUTH ONCE DAILY FOR STOMACH  . torsemide ([name]) 20 MG tablet Take 2 tablets (40 mg total) by mouth daily.  Marland Kitchen warfarin (COUMADIN) 5 MG tablet Take 1-1.5 tablets (5-7.5 mg total) by mouth See admin instructions. Take 1 tablet (5mg ) by mouth every Monday, Tuesday. Wednesday, Friday, Saturday and Sunday evening and take 1 tablets (7.5mg ) by mouth every Thursday evening  . [DISCONTINUED] pantoprazole (PROTONIX) 40 MG tablet TAKE 1 TABLET BY MOUTH ONCE DAILY FOR STOMACH  . nystatin ointment (MYCOSTATIN) Apply 1 application topically 2 (two) times daily.   No facility-administered encounter medications on file as of 09/26/2019.    Surgical History: Past Surgical History:  Procedure Laterality Date  . aoric valve replacemet      st Jude  . CATARACT EXTRACTION, BILATERAL    . COLONOSCOPY  WITH PROPOFOL N/A 07/17/2019   Procedure: COLONOSCOPY WITH PROPOFOL;  Surgeon: Lin Landsman, MD;  Location: [facility] ENDOSCOPY;  Service: Gastroenterology;  Laterality: N/A;  . ESOPHAGOGASTRODUODENOSCOPY N/A 07/17/2019   Procedure: ESOPHAGOGASTRODUODENOSCOPY (EGD);  Surgeon: Lin Landsman, MD;  Location: [facility] ENDOSCOPY;  Service: Gastroenterology;  Laterality: N/A;  . PACEMAKER PLACEMENT    . VALVE REPLACEMENT      Medical History: Past Medical History:  Diagnosis  Date  . Anemia   . CHF (congestive heart failure) ([place])   . Chronic kidney disease   . COPD (chronic obstructive pulmonary disease) ([place])   . Diabetes mellitus without complication ([place])   . Hypertension     Family History: History reviewed. No pertinent family history.  Social History   Socioeconomic History  . Marital status: Widowed    Spouse name: Not on file  . Number of children: Not on file  . Years of education: Not on file  . Highest education level: Not on file  Occupational History  . Not on file  Tobacco Use  . Smoking status: Never Smoker  . Smokeless tobacco: Never Used  Substance and Sexual Activity  . Alcohol use: No  . Drug use: No  . Sexual activity: Never  Other Topics Concern  . Not on file  Social History Narrative  . Not on file   Social Determinants of Health   Financial Resource Strain: Low Risk   . Difficulty of Paying Living Expenses: Not hard at all  Food Insecurity: No Food Insecurity  . Worried About Charity fundraiser in the Last Year: Never true  . Ran Out of Food in the Last Year: Never true  Transportation Needs: No Transportation Needs  . Lack of Transportation (Medical): No  . Lack of Transportation (Non-Medical): No  Physical Activity:   . Days of Exercise per Week:   . Minutes of Exercise per Session:   Stress:   . Feeling of Stress :   Social Connections:   . Frequency of Communication with Friends and Family:   . Frequency of Social Gatherings with Friends and Family:   . Attends Religious Services:   . Active Member of Clubs or Organizations:   . Attends Archivist Meetings:   Marland Kitchen Marital Status:   Intimate Partner Violence:   . Fear of Current or Ex-Partner:   . Emotionally Abused:   Marland Kitchen Physically Abused:   . Sexually Abused:       Review of Systems  Constitutional: Positive for activity change and fatigue. Negative for chills and diaphoresis.       Patient less active right now due to increased  shortness of breath.   HENT: Negative for ear pain, postnasal drip and sinus pressure.   Respiratory: Positive for shortness of breath and wheezing. Negative for cough.        Oxygen requirement higher at this time to keep saturations over 90%.   Cardiovascular: Positive for leg swelling. Negative for chest pain and palpitations.       Elevated blood pressure. Leg swelling improved since she was last seen.   Gastrointestinal: Positive for abdominal distention. Negative for abdominal pain, constipation, diarrhea, nausea and vomiting.  Endocrine: Negative for cold intolerance, heat intolerance, polydipsia and polyuria.       Blood sugars more elevated at night and doing well during the day.   Musculoskeletal: Positive for myalgias. Negative for arthralgias, back pain, gait problem and neck pain.  Skin: Negative for color change.  Allergic/Immunologic:  Negative for environmental allergies and food allergies.  Neurological: Positive for weakness. Negative for dizziness and headaches.  Hematological: Does not bruise/bleed easily.  Psychiatric/Behavioral: Positive for dysphoric mood. Negative for agitation, behavioral problems (depression) and hallucinations. The patient is not nervous/anxious.     Today's Vitals   09/26/19 1131  BP: (!) 161/64  Pulse: 61  Resp: 16  Temp: (!) 96.5 F (35.8 C)  SpO2: 93%  Weight: 183 lb (83 kg)  Height: 5\' 2"  (1.575 m)   Body mass index is 33.47 kg/m.  Physical Exam Vitals and nursing note reviewed.  Constitutional:      General: She is not in acute distress.    Appearance: Normal appearance. She is well-developed. She is ill-appearing. She is not diaphoretic.  HENT:     Head: Normocephalic and atraumatic.     Nose: Nose normal.     Mouth/Throat:     Pharynx: No oropharyngeal exudate.  Eyes:     Pupils: Pupils are equal, round, and reactive to light.  Neck:     Thyroid: No thyromegaly.     Vascular: No JVD.     Trachea: No tracheal deviation.   Cardiovascular:     Rate and Rhythm: Normal rate and regular rhythm.     Pulses: Normal pulses.     Heart sounds: Normal heart sounds. No murmur. No friction rub. No gallop.      Comments: 1+ pitting edema in bilateral lower legs.  Pulmonary:     Effort: Pulmonary effort is normal. No respiratory distress.     Breath sounds: Normal breath sounds. No wheezing or rales.     Comments: Oxygen at 6lpm Chest:     Chest wall: No tenderness.  Abdominal:     Palpations: Abdomen is soft.  Musculoskeletal:        General: Normal range of motion.     Cervical back: Normal range of motion and neck supple.  Lymphadenopathy:     Cervical: No cervical adenopathy.  Skin:    General: Skin is warm and dry.  Neurological:     General: No focal deficit present.     Mental Status: She is alert and oriented to person, place, and time.     Cranial Nerves: No cranial nerve deficit.  Psychiatric:        Behavior: Behavior normal.        Thought Content: Thought content normal.        Judgment: Judgment normal.   Assessment/Plan: 1. Hospital discharge follow-up Reviewed records from patient's most recent hospitalization.   2. Diastolic CHF, acute on chronic (HCC) Will get echocardiogram to evaluate cardiac function. Refer back to cardiology. - ECHOCARDIOGRAM COMPLETE; Future  3. SOB (shortness of breath) Will get echocardiogram to evaluate cardiac function. Patient has scheduled visit with pulmonary tomorrow.  - ECHOCARDIOGRAM COMPLETE; Future  4. Type 2 diabetes mellitus with stage 3b chronic kidney disease, with long-term current use of insulin ([place]) Reviewed blood sugar log with the patient and her family. No changes made today to insulin dosing. Continue to check sugars twice daily.   5. Gastroesophageal reflux disease without esophagitis - pantoprazole (PROTONIX) 40 MG tablet; TAKE 1 TABLET BY MOUTH ONCE DAILY FOR STOMACH  Dispense: 90 tablet; Refill: 1  6. Cutaneous candidiasis Nystatin  ointment should be applied to affected area twice daily as needed.  - nystatin ointment (MYCOSTATIN); Apply 1 application topically 2 (two) times daily.  Dispense: 30 g; Refill: 1   6. Long term use of  anticoagulants - INR goal between 2.0 and 3.0 -  INR 3.2. continue warfarin use at current dosing.   General Counseling: Airica verbalizes understanding of the findings of todays visit and agrees with plan of treatment. I have discussed any further diagnostic evaluation that may be needed or ordered today. We also reviewed her medications today. she has been encouraged to call the office with any questions or concerns that should arise related to todays visit.    Counseling:  Cardiac risk factor modification:  1. Control blood pressure. 2. Exercise as prescribed. 3. Follow low sodium, low fat diet. and low fat and low cholestrol diet. 4. Take ASA 81mg  once a day. 5. Restricted calories diet to lose weight.  This patient was seen by Leretha Pol FNP Collaboration with Dr [name] as a part of collaborative care agreement  Orders Placed This Encounter  Procedures  . ECHOCARDIOGRAM COMPLETE      I have reviewed all medical records from hospital follow up including radiology reports and consults from other physicians. Appropriate follow up diagnostics will be scheduled as needed. Patient/ Family understands the plan of treatment. Time spent 30 minutes.   Dr [name], MD Internal Medicine

## 2019-09-27 ENCOUNTER — Telehealth: Payer: Self-pay

## 2019-09-27 ENCOUNTER — Other Ambulatory Visit: Payer: Self-pay

## 2019-09-27 ENCOUNTER — Ambulatory Visit (INDEPENDENT_AMBULATORY_CARE_PROVIDER_SITE_OTHER): Payer: Medicare Other | Admitting: Internal Medicine

## 2019-09-27 ENCOUNTER — Encounter: Payer: Self-pay | Admitting: Internal Medicine

## 2019-09-27 VITALS — BP 149/60 | HR 65 | Temp 96.8°F | Resp 16 | Ht 62.0 in | Wt 180.6 lb

## 2019-09-27 DIAGNOSIS — G4733 Obstructive sleep apnea (adult) (pediatric): Secondary | ICD-10-CM

## 2019-09-27 DIAGNOSIS — J9611 Chronic respiratory failure with hypoxia: Secondary | ICD-10-CM

## 2019-09-27 DIAGNOSIS — Z9989 Dependence on other enabling machines and devices: Secondary | ICD-10-CM | POA: Diagnosis not present

## 2019-09-27 DIAGNOSIS — I5032 Chronic diastolic (congestive) heart failure: Secondary | ICD-10-CM | POA: Diagnosis not present

## 2019-09-27 DIAGNOSIS — J209 Acute bronchitis, unspecified: Secondary | ICD-10-CM

## 2019-09-27 DIAGNOSIS — R609 Edema, unspecified: Secondary | ICD-10-CM

## 2019-09-27 DIAGNOSIS — Z9981 Dependence on supplemental oxygen: Secondary | ICD-10-CM | POA: Diagnosis not present

## 2019-09-27 NOTE — Telephone Encounter (Signed)
Spoke with mike from [facility] and as per heather put order

## 2019-09-27 NOTE — Progress Notes (Signed)
[facility] [place], [place] [address]  Internal MEDICINE  Telephone Visit  Patient Name: Kimberly Peterson  [id number]  [id number]  Date of Service: 09/27/2019  I connected with the patient at [phone] by video and verified the patients identity using two identifiers.   I discussed the limitations, risks, security and privacy concerns of performing an evaluation and management service by telephone and the availability of in person appointments. I also discussed with the patient that there may be a patient responsible charge related to the service.  The patient expressed understanding and agrees to proceed.    Chief Complaint  Patient presents with  . Follow-up  . COPD    HPI  Pt is seen via video.  She has a history of chronic respiratory failure, osa, oxygen dependence and chf. Overall she is doing fair.  She complains of increased sob.  She is using oxygen at 3 LPM at rest, and 6 lpm when walking. She denies any fever at this time.  Pt reports good compliance with CPAP therapy. Cleaning machine by hand, and changing filters and tubing as directed. Denies headaches, sinus issues, palpitations, or hemoptysis.  Her last echo was in [place] showed EF of 60-65%.        Current Medication: Outpatient Encounter Medications as of 09/27/2019  Medication Sig  . aspirin 81 MG tablet Take 81 mg by mouth daily.  Marland Kitchen atorvastatin (LIPITOR) 10 MG tablet TAKE 1 TABLET BY MOUTH ONCE DAILY FOR CHOLESTEROL (Patient taking differently: Take 10 mg by mouth daily. )  . CARTIA XT 240 MG 24 hr capsule Take 1 capsule by mouth once daily (Patient taking differently: Take 240 mg by mouth daily. )  . carvedilol (COREG) 6.25 MG tablet Take 6.25 mg by mouth 2 (two) times daily with a meal.   . hydrALAZINE (APRESOLINE) 25 MG tablet Take 1.5 tablets (37.5 mg total) by mouth 3 (three) times daily.  . insulin aspart protamine- aspart (NOVOLOG MIX 70/30) (70-30) 100 UNIT/ML injection Inject 0.2 mLs  (20 Units total) into the skin 2 (two) times daily with a meal.  . ipratropium-albuterol (DUONEB) 0.5-2.5 (3) MG/3ML SOLN Take 3 mLs by nebulization every 6 (six) hours as needed. Dx-shortness of breath  . nystatin ointment (MYCOSTATIN) Apply 1 application topically 2 (two) times daily.  . pantoprazole (PROTONIX) 40 MG tablet TAKE 1 TABLET BY MOUTH ONCE DAILY FOR STOMACH  . torsemide ([name]) 20 MG tablet Take 2 tablets (40 mg total) by mouth daily.  Marland Kitchen warfarin (COUMADIN) 5 MG tablet Take 1-1.5 tablets (5-7.5 mg total) by mouth See admin instructions. Take 1 tablet (5mg ) by mouth every Monday, Tuesday. Wednesday, Friday, Saturday and Sunday evening and take 1 tablets (7.5mg ) by mouth every Thursday evening   No facility-administered encounter medications on file as of 09/27/2019.    Surgical History: Past Surgical History:  Procedure Laterality Date  . aoric valve replacemet      st Jude  . CATARACT EXTRACTION, BILATERAL    . COLONOSCOPY WITH PROPOFOL N/A 07/17/2019   Procedure: COLONOSCOPY WITH PROPOFOL;  Surgeon: Lin Landsman, MD;  Location: [facility] ENDOSCOPY;  Service: Gastroenterology;  Laterality: N/A;  . ESOPHAGOGASTRODUODENOSCOPY N/A 07/17/2019   Procedure: ESOPHAGOGASTRODUODENOSCOPY (EGD);  Surgeon: Lin Landsman, MD;  Location: [facility] ENDOSCOPY;  Service: Gastroenterology;  Laterality: N/A;  . PACEMAKER PLACEMENT    . VALVE REPLACEMENT      Medical History: Past Medical History:  Diagnosis Date  . Anemia   . CHF (  congestive heart failure) ([place])   . Chronic kidney disease   . COPD (chronic obstructive pulmonary disease) ([place])   . Diabetes mellitus without complication ([place])   . Hypertension     Family History: History reviewed. No pertinent family history.  Social History   Socioeconomic History  . Marital status: Widowed    Spouse name: Not on file  . Number of children: Not on file  . Years of education: Not on file  . Highest education level: Not on file   Occupational History  . Not on file  Tobacco Use  . Smoking status: Never Smoker  . Smokeless tobacco: Never Used  Substance and Sexual Activity  . Alcohol use: No  . Drug use: No  . Sexual activity: Never  Other Topics Concern  . Not on file  Social History Narrative  . Not on file   Social Determinants of Health   Financial Resource Strain: Low Risk   . Difficulty of Paying Living Expenses: Not hard at all  Food Insecurity: No Food Insecurity  . Worried About Charity fundraiser in the Last Year: Never true  . Ran Out of Food in the Last Year: Never true  Transportation Needs: No Transportation Needs  . Lack of Transportation (Medical): No  . Lack of Transportation (Non-Medical): No  Physical Activity:   . Days of Exercise per Week:   . Minutes of Exercise per Session:   Stress:   . Feeling of Stress :   Social Connections:   . Frequency of Communication with Friends and Family:   . Frequency of Social Gatherings with Friends and Family:   . Attends Religious Services:   . Active Member of Clubs or Organizations:   . Attends Archivist Meetings:   Marland Kitchen Marital Status:   Intimate Partner Violence:   . Fear of Current or Ex-Partner:   . Emotionally Abused:   Marland Kitchen Physically Abused:   . Sexually Abused:       Review of Systems  Constitutional: Negative for chills, fatigue and unexpected weight change.  HENT: Negative for congestion, rhinorrhea, sneezing and sore throat.   Eyes: Negative for photophobia, pain and redness.  Respiratory: Negative for cough, chest tightness and shortness of breath.   Cardiovascular: Negative for chest pain and palpitations.  Gastrointestinal: Negative for abdominal pain, constipation, diarrhea, nausea and vomiting.  Endocrine: Negative.   Genitourinary: Negative for dysuria and frequency.  Musculoskeletal: Negative for arthralgias, back pain, joint swelling and neck pain.  Skin: Negative for rash.  Allergic/Immunologic:  Negative.   Neurological: Negative for tremors and numbness.  Hematological: Negative for adenopathy. Does not bruise/bleed easily.  Psychiatric/Behavioral: Negative for behavioral problems and sleep disturbance. The patient is not nervous/anxious.     Vital Signs: BP (!) 149/60   Pulse 65   Temp (!) 96.8 F (36 C)   Resp 16   Ht 5\' 2"  (1.575 m)   Wt 180 lb 9.6 oz (81.9 kg)   SpO2 96%   BMI 33.03 kg/m    Observation/Objective:  Well appearing, nad noted.   Assessment/Plan: 1. OSA on CPAP Continue to use cpap when sleeping as directed.  2. Chronic respiratory failure with hypoxia (HCC) Continue to monitor closely, continue oxygen use as prescribed.   3. Dependence on supplemental oxygen Continue to use 2-4 Lpm of oxygen as prescribed.   4. CHF (congestive heart failure), NYHA class I, chronic, diastolic ([place]) Follow with cardiology closely.   General Counseling: Shaylea verbalizes understanding of  the findings of today's phone visit and agrees with plan of treatment. I have discussed any further diagnostic evaluation that may be needed or ordered today. We also reviewed her medications today. she has been encouraged to call the office with any questions or concerns that should arise related to todays visit.    No orders of the defined types were placed in this encounter.   No orders of the defined types were placed in this encounter.   Time spent: 25 Minutes    Orson Gear AGNP-C Pulmonary medicine.

## 2019-09-28 ENCOUNTER — Ambulatory Visit (INDEPENDENT_AMBULATORY_CARE_PROVIDER_SITE_OTHER): Payer: Medicare Other

## 2019-09-28 DIAGNOSIS — Z79899 Other long term (current) drug therapy: Secondary | ICD-10-CM | POA: Diagnosis not present

## 2019-09-28 DIAGNOSIS — Z7901 Long term (current) use of anticoagulants: Secondary | ICD-10-CM

## 2019-09-28 DIAGNOSIS — I4891 Unspecified atrial fibrillation: Secondary | ICD-10-CM

## 2019-09-28 NOTE — Progress Notes (Signed)
Pt INR 3.2 continue same med as pres already discuss with pt on visit

## 2019-09-29 ENCOUNTER — Ambulatory Visit: Payer: Medicare Other | Admitting: *Deleted

## 2019-09-29 ENCOUNTER — Other Ambulatory Visit: Payer: Self-pay | Admitting: *Deleted

## 2019-09-29 ENCOUNTER — Telehealth: Payer: Self-pay | Admitting: Adult Health Nurse Practitioner

## 2019-09-29 ENCOUNTER — Telehealth: Payer: Self-pay

## 2019-09-29 NOTE — Telephone Encounter (Signed)
Called to schedule follow up appointment.  Set up appointment for 10/06/19 at 3pm Kimberly Peterson K. Olena Heckle NP

## 2019-09-29 NOTE — Telephone Encounter (Signed)
Pt daughter called that her glucose is high 596 and pt had no appetite as per dr Humphrey Rolls take 8 units of NOVOLIN R and also take other insulin as pres and call us back and also advised pt to watch diet and eat portion due to pt is not moving around advised he check glucose again after and call us back

## 2019-09-29 NOTE — Patient Outreach (Signed)
[facility]) Care Management  09/29/2019  MERLINE PERKIN Dec 15, 1929 [id number]   No answer, unable to leave message.  Call then placed to daughter [name], she report member is still having trouble with her phone line.  State she has continued to be short of breath with minimal exertion as well as elevated blood sugar.  Report member's blood sugar today was 596, PCP notified and additional 8 units of insulin provided.  Member has had follow up with both PCP and pulmonology since discharge, next PCP visit on 4/23.  Discussed follow up with cardiology, daughter state member would prefer to see regular cardiologist Dr. [name] and not the heart failure clinic, gives this care manager permission to call office to schedule phone visit.  Daughter verbalizes understanding of heart failure zones and when to contact MD versus when to call 911.  Denies any urgent concerns at this time.    Noted per chart that member should still be active with [facility] for palliative care services however no contact since December 2020.  Call placed to follow up on contact with member, spoke to [place], notified they will reach out to member today to schedule visit.  Apologize for no contact since December.  Call also placed to Dr. [name], phone visit scheduled for 4/28.  Daughter notified of updates, will follow up with member/daughter within the next month.  THN CM Care Plan Problem Two     Most Recent Value  Care Plan Problem Two  Difficulty managing chronic medical conditions as evidenced by recurrent hospitalizations  Role Documenting the Problem Two  Care Management [place] for Problem Two  Active  Interventions for Problem Two Long Term Goal   Most recent AVS reviewed with daughter.  Call placed to cardiology office to schedule follow up appointment.  [place] Term Goal  Member will report no hospitalizations within the next 60 days  THN Long Term Goal Start Date  09/29/19 [Not met,  date reset]  THN CM Short Term Goal #1   Member will report having follow up with EMT/paramedicine within the next 3 weeks  THN CM Short Term Goal #1 Start Date  08/18/19  THN CM Short Term Goal #1 Met Date   -- [Not met]  THN CM Short Term Goal #2   Member will report blood sugars controlled/decreased over the next 3 weeks  THN CM Short Term Goal #2 Start Date  09/29/19 [Not met, date reset]  Interventions for Short Term Goal #2  Reviewed recent readings with daughter, encouraged to recheck blood sugar post intervention and follow up with PCP  [facility] CM Short Term Goal #3   Member will have home visit from palliative care team within the next 2 weeks  THN CM Short Term Goal #3 Start Date  09/29/19  Interventions for Short Term Goal #3  Call placed to [facility] to follow up on involvement and to report recent hospitalization     Valente David, RN, MSN [place] [phone]

## 2019-09-30 DIAGNOSIS — K219 Gastro-esophageal reflux disease without esophagitis: Secondary | ICD-10-CM | POA: Diagnosis not present

## 2019-09-30 DIAGNOSIS — Z950 Presence of cardiac pacemaker: Secondary | ICD-10-CM | POA: Diagnosis not present

## 2019-09-30 DIAGNOSIS — Z7982 Long term (current) use of aspirin: Secondary | ICD-10-CM | POA: Diagnosis not present

## 2019-09-30 DIAGNOSIS — D631 Anemia in chronic kidney disease: Secondary | ICD-10-CM | POA: Diagnosis not present

## 2019-09-30 DIAGNOSIS — J449 Chronic obstructive pulmonary disease, unspecified: Secondary | ICD-10-CM | POA: Diagnosis not present

## 2019-09-30 DIAGNOSIS — Z952 Presence of prosthetic heart valve: Secondary | ICD-10-CM | POA: Diagnosis not present

## 2019-09-30 DIAGNOSIS — Z9981 Dependence on supplemental oxygen: Secondary | ICD-10-CM | POA: Diagnosis not present

## 2019-09-30 DIAGNOSIS — Z7901 Long term (current) use of anticoagulants: Secondary | ICD-10-CM | POA: Diagnosis not present

## 2019-09-30 DIAGNOSIS — E1122 Type 2 diabetes mellitus with diabetic chronic kidney disease: Secondary | ICD-10-CM | POA: Diagnosis not present

## 2019-09-30 DIAGNOSIS — I480 Paroxysmal atrial fibrillation: Secondary | ICD-10-CM | POA: Diagnosis not present

## 2019-09-30 DIAGNOSIS — I5033 Acute on chronic diastolic (congestive) heart failure: Secondary | ICD-10-CM | POA: Diagnosis not present

## 2019-09-30 DIAGNOSIS — I2699 Other pulmonary embolism without acute cor pulmonale: Secondary | ICD-10-CM | POA: Diagnosis not present

## 2019-09-30 DIAGNOSIS — N1832 Chronic kidney disease, stage 3b: Secondary | ICD-10-CM | POA: Diagnosis not present

## 2019-09-30 DIAGNOSIS — I442 Atrioventricular block, complete: Secondary | ICD-10-CM | POA: Diagnosis not present

## 2019-09-30 DIAGNOSIS — I130 Hypertensive heart and chronic kidney disease with heart failure and stage 1 through stage 4 chronic kidney disease, or unspecified chronic kidney disease: Secondary | ICD-10-CM | POA: Diagnosis not present

## 2019-09-30 DIAGNOSIS — Z794 Long term (current) use of insulin: Secondary | ICD-10-CM | POA: Diagnosis not present

## 2019-09-30 DIAGNOSIS — E785 Hyperlipidemia, unspecified: Secondary | ICD-10-CM | POA: Diagnosis not present

## 2019-09-30 DIAGNOSIS — J9621 Acute and chronic respiratory failure with hypoxia: Secondary | ICD-10-CM | POA: Diagnosis not present

## 2019-09-30 DIAGNOSIS — G4733 Obstructive sleep apnea (adult) (pediatric): Secondary | ICD-10-CM | POA: Diagnosis not present

## 2019-10-03 ENCOUNTER — Telehealth: Payer: Self-pay

## 2019-10-03 ENCOUNTER — Ambulatory Visit (INDEPENDENT_AMBULATORY_CARE_PROVIDER_SITE_OTHER): Payer: Medicare Other

## 2019-10-03 DIAGNOSIS — N1832 Chronic kidney disease, stage 3b: Secondary | ICD-10-CM | POA: Diagnosis not present

## 2019-10-03 DIAGNOSIS — I130 Hypertensive heart and chronic kidney disease with heart failure and stage 1 through stage 4 chronic kidney disease, or unspecified chronic kidney disease: Secondary | ICD-10-CM | POA: Diagnosis not present

## 2019-10-03 DIAGNOSIS — Z7901 Long term (current) use of anticoagulants: Secondary | ICD-10-CM | POA: Diagnosis not present

## 2019-10-03 DIAGNOSIS — I4821 Permanent atrial fibrillation: Secondary | ICD-10-CM | POA: Diagnosis not present

## 2019-10-03 DIAGNOSIS — D631 Anemia in chronic kidney disease: Secondary | ICD-10-CM | POA: Diagnosis not present

## 2019-10-03 DIAGNOSIS — E1122 Type 2 diabetes mellitus with diabetic chronic kidney disease: Secondary | ICD-10-CM | POA: Diagnosis not present

## 2019-10-03 DIAGNOSIS — J449 Chronic obstructive pulmonary disease, unspecified: Secondary | ICD-10-CM | POA: Diagnosis not present

## 2019-10-03 DIAGNOSIS — I5033 Acute on chronic diastolic (congestive) heart failure: Secondary | ICD-10-CM | POA: Diagnosis not present

## 2019-10-03 NOTE — Progress Notes (Signed)
Pt INR 4.6 as per heather advised skip dose for today and tomorrow and go back to normal dosing

## 2019-10-03 NOTE — Telephone Encounter (Signed)
[facility] CALLED TO GET VERBAL ORDERS  1 TIME A WEEK FOR 1 WEEK  2 TIME A WEEK FOR 4 WEEKS 1 TIME A WEEK FOR 4 WEEKS  GAVE VERBAL OK.

## 2019-10-05 ENCOUNTER — Telehealth: Payer: Self-pay

## 2019-10-05 NOTE — Telephone Encounter (Signed)
Both are questioning the echo.

## 2019-10-05 NOTE — Telephone Encounter (Signed)
-----   Message from Ronnell Freshwater, NP sent at 10/04/2019  5:15 PM EDT ----- Do you know who is wanting to cancel the echo? Is It the patient or the hospital?  ----- Message ----- From: Laurie Panda Sent: 10/04/2019  11:27 AM EDT To: Ronnell Freshwater, NP  Patient had echo in [place] and they are wanting to canel the one that you just ordered... is that ok to cancel? The hospital needs to know fairly quickly

## 2019-10-06 DIAGNOSIS — J449 Chronic obstructive pulmonary disease, unspecified: Secondary | ICD-10-CM | POA: Diagnosis not present

## 2019-10-06 DIAGNOSIS — I130 Hypertensive heart and chronic kidney disease with heart failure and stage 1 through stage 4 chronic kidney disease, or unspecified chronic kidney disease: Secondary | ICD-10-CM | POA: Diagnosis not present

## 2019-10-06 DIAGNOSIS — I5033 Acute on chronic diastolic (congestive) heart failure: Secondary | ICD-10-CM | POA: Diagnosis not present

## 2019-10-06 DIAGNOSIS — D631 Anemia in chronic kidney disease: Secondary | ICD-10-CM | POA: Diagnosis not present

## 2019-10-06 DIAGNOSIS — E1122 Type 2 diabetes mellitus with diabetic chronic kidney disease: Secondary | ICD-10-CM | POA: Diagnosis not present

## 2019-10-06 DIAGNOSIS — N1832 Chronic kidney disease, stage 3b: Secondary | ICD-10-CM | POA: Diagnosis not present

## 2019-10-06 DEATH — deceased

## 2019-10-09 ENCOUNTER — Encounter: Payer: Self-pay | Admitting: *Deleted

## 2019-10-09 ENCOUNTER — Inpatient Hospital Stay
Admission: EM | Admit: 2019-10-09 | Discharge: 2019-10-17 | DRG: 291 | Disposition: A | Discharge status: Hospice | Payer: Medicare Other | Attending: Internal Medicine | Admitting: Internal Medicine

## 2019-10-09 ENCOUNTER — Other Ambulatory Visit: Payer: Self-pay

## 2019-10-09 ENCOUNTER — Emergency Department: Payer: Medicare Other

## 2019-10-09 DIAGNOSIS — E1122 Type 2 diabetes mellitus with diabetic chronic kidney disease: Secondary | ICD-10-CM | POA: Diagnosis present

## 2019-10-09 DIAGNOSIS — J449 Chronic obstructive pulmonary disease, unspecified: Secondary | ICD-10-CM | POA: Diagnosis present

## 2019-10-09 DIAGNOSIS — I248 Other forms of acute ischemic heart disease: Secondary | ICD-10-CM | POA: Diagnosis present

## 2019-10-09 DIAGNOSIS — Z794 Long term (current) use of insulin: Secondary | ICD-10-CM

## 2019-10-09 DIAGNOSIS — E782 Mixed hyperlipidemia: Secondary | ICD-10-CM | POA: Diagnosis present

## 2019-10-09 DIAGNOSIS — J9611 Chronic respiratory failure with hypoxia: Secondary | ICD-10-CM

## 2019-10-09 DIAGNOSIS — R778 Other specified abnormalities of plasma proteins: Secondary | ICD-10-CM | POA: Diagnosis present

## 2019-10-09 DIAGNOSIS — Z66 Do not resuscitate: Secondary | ICD-10-CM | POA: Diagnosis not present

## 2019-10-09 DIAGNOSIS — N179 Acute kidney failure, unspecified: Secondary | ICD-10-CM | POA: Diagnosis present

## 2019-10-09 DIAGNOSIS — I130 Hypertensive heart and chronic kidney disease with heart failure and stage 1 through stage 4 chronic kidney disease, or unspecified chronic kidney disease: Secondary | ICD-10-CM | POA: Diagnosis not present

## 2019-10-09 DIAGNOSIS — I1310 Hypertensive heart and chronic kidney disease without heart failure, with stage 1 through stage 4 chronic kidney disease, or unspecified chronic kidney disease: Secondary | ICD-10-CM

## 2019-10-09 DIAGNOSIS — Z86711 Personal history of pulmonary embolism: Secondary | ICD-10-CM

## 2019-10-09 DIAGNOSIS — Z9981 Dependence on supplemental oxygen: Secondary | ICD-10-CM

## 2019-10-09 DIAGNOSIS — I10 Essential (primary) hypertension: Secondary | ICD-10-CM | POA: Diagnosis not present

## 2019-10-09 DIAGNOSIS — E1151 Type 2 diabetes mellitus with diabetic peripheral angiopathy without gangrene: Secondary | ICD-10-CM | POA: Diagnosis present

## 2019-10-09 DIAGNOSIS — Z7982 Long term (current) use of aspirin: Secondary | ICD-10-CM

## 2019-10-09 DIAGNOSIS — Z6837 Body mass index (BMI) 37.0-37.9, adult: Secondary | ICD-10-CM

## 2019-10-09 DIAGNOSIS — N183 Chronic kidney disease, stage 3 unspecified: Secondary | ICD-10-CM

## 2019-10-09 DIAGNOSIS — I110 Hypertensive heart disease with heart failure: Secondary | ICD-10-CM | POA: Diagnosis not present

## 2019-10-09 DIAGNOSIS — R0602 Shortness of breath: Secondary | ICD-10-CM | POA: Diagnosis not present

## 2019-10-09 DIAGNOSIS — I5033 Acute on chronic diastolic (congestive) heart failure: Secondary | ICD-10-CM | POA: Diagnosis present

## 2019-10-09 DIAGNOSIS — I4891 Unspecified atrial fibrillation: Secondary | ICD-10-CM | POA: Diagnosis present

## 2019-10-09 DIAGNOSIS — Z7901 Long term (current) use of anticoagulants: Secondary | ICD-10-CM

## 2019-10-09 DIAGNOSIS — D631 Anemia in chronic kidney disease: Secondary | ICD-10-CM | POA: Diagnosis present

## 2019-10-09 DIAGNOSIS — Z952 Presence of prosthetic heart valve: Secondary | ICD-10-CM

## 2019-10-09 DIAGNOSIS — Z20822 Contact with and (suspected) exposure to covid-19: Secondary | ICD-10-CM | POA: Diagnosis present

## 2019-10-09 DIAGNOSIS — N1832 Chronic kidney disease, stage 3b: Secondary | ICD-10-CM | POA: Diagnosis present

## 2019-10-09 DIAGNOSIS — J90 Pleural effusion, not elsewhere classified: Secondary | ICD-10-CM | POA: Diagnosis not present

## 2019-10-09 DIAGNOSIS — K219 Gastro-esophageal reflux disease without esophagitis: Secondary | ICD-10-CM | POA: Diagnosis present

## 2019-10-09 DIAGNOSIS — J9621 Acute and chronic respiratory failure with hypoxia: Secondary | ICD-10-CM | POA: Diagnosis not present

## 2019-10-09 DIAGNOSIS — G4733 Obstructive sleep apnea (adult) (pediatric): Secondary | ICD-10-CM | POA: Diagnosis present

## 2019-10-09 DIAGNOSIS — Z515 Encounter for palliative care: Secondary | ICD-10-CM

## 2019-10-09 DIAGNOSIS — E875 Hyperkalemia: Secondary | ICD-10-CM | POA: Diagnosis not present

## 2019-10-09 DIAGNOSIS — I272 Pulmonary hypertension, unspecified: Secondary | ICD-10-CM | POA: Diagnosis present

## 2019-10-09 DIAGNOSIS — R34 Anuria and oliguria: Secondary | ICD-10-CM | POA: Diagnosis not present

## 2019-10-09 DIAGNOSIS — Z950 Presence of cardiac pacemaker: Secondary | ICD-10-CM

## 2019-10-09 DIAGNOSIS — J811 Chronic pulmonary edema: Secondary | ICD-10-CM | POA: Diagnosis not present

## 2019-10-09 NOTE — ED Triage Notes (Signed)
Pt to ED via [facility] from home reporting increased SOB over the past 24 hours. Pt is on 2.5L of oxygen at baseline but placed on 4L with EMS due to increased WOB and SOB. Pt able to speak in complete sentences when sitting but per EMS pt had a significant increase in WOB upon exersion. Pt was admitted 2 weeks ago for similar symptoms. Bilateral lower ext. Swelling with decreased urine output per family. Diminished lower lung sounds bilaterally.   No chest pain, nausea, vomiting or diarrhea.  EMS vitals : 166/84 98.1 F CBG 114

## 2019-10-10 ENCOUNTER — Encounter: Payer: Self-pay | Admitting: Internal Medicine

## 2019-10-10 DIAGNOSIS — I4811 Longstanding persistent atrial fibrillation: Secondary | ICD-10-CM | POA: Diagnosis not present

## 2019-10-10 DIAGNOSIS — Z952 Presence of prosthetic heart valve: Secondary | ICD-10-CM | POA: Diagnosis not present

## 2019-10-10 DIAGNOSIS — I10 Essential (primary) hypertension: Secondary | ICD-10-CM | POA: Diagnosis not present

## 2019-10-10 DIAGNOSIS — I5031 Acute diastolic (congestive) heart failure: Secondary | ICD-10-CM | POA: Diagnosis not present

## 2019-10-10 DIAGNOSIS — Z9981 Dependence on supplemental oxygen: Secondary | ICD-10-CM | POA: Diagnosis not present

## 2019-10-10 DIAGNOSIS — E1151 Type 2 diabetes mellitus with diabetic peripheral angiopathy without gangrene: Secondary | ICD-10-CM

## 2019-10-10 DIAGNOSIS — E875 Hyperkalemia: Secondary | ICD-10-CM | POA: Diagnosis not present

## 2019-10-10 DIAGNOSIS — E782 Mixed hyperlipidemia: Secondary | ICD-10-CM | POA: Diagnosis present

## 2019-10-10 DIAGNOSIS — Z86711 Personal history of pulmonary embolism: Secondary | ICD-10-CM | POA: Diagnosis not present

## 2019-10-10 DIAGNOSIS — J9611 Chronic respiratory failure with hypoxia: Secondary | ICD-10-CM | POA: Diagnosis present

## 2019-10-10 DIAGNOSIS — K219 Gastro-esophageal reflux disease without esophagitis: Secondary | ICD-10-CM | POA: Diagnosis present

## 2019-10-10 DIAGNOSIS — I5033 Acute on chronic diastolic (congestive) heart failure: Secondary | ICD-10-CM | POA: Diagnosis present

## 2019-10-10 DIAGNOSIS — Z20822 Contact with and (suspected) exposure to covid-19: Secondary | ICD-10-CM | POA: Diagnosis present

## 2019-10-10 DIAGNOSIS — I509 Heart failure, unspecified: Secondary | ICD-10-CM | POA: Diagnosis not present

## 2019-10-10 DIAGNOSIS — R778 Other specified abnormalities of plasma proteins: Secondary | ICD-10-CM

## 2019-10-10 DIAGNOSIS — Z950 Presence of cardiac pacemaker: Secondary | ICD-10-CM | POA: Diagnosis not present

## 2019-10-10 DIAGNOSIS — Z66 Do not resuscitate: Secondary | ICD-10-CM | POA: Diagnosis not present

## 2019-10-10 DIAGNOSIS — J449 Chronic obstructive pulmonary disease, unspecified: Secondary | ICD-10-CM | POA: Diagnosis present

## 2019-10-10 DIAGNOSIS — J811 Chronic pulmonary edema: Secondary | ICD-10-CM | POA: Diagnosis not present

## 2019-10-10 DIAGNOSIS — J90 Pleural effusion, not elsewhere classified: Secondary | ICD-10-CM | POA: Diagnosis not present

## 2019-10-10 DIAGNOSIS — G4733 Obstructive sleep apnea (adult) (pediatric): Secondary | ICD-10-CM | POA: Diagnosis present

## 2019-10-10 DIAGNOSIS — I130 Hypertensive heart and chronic kidney disease with heart failure and stage 1 through stage 4 chronic kidney disease, or unspecified chronic kidney disease: Secondary | ICD-10-CM | POA: Diagnosis present

## 2019-10-10 DIAGNOSIS — Z794 Long term (current) use of insulin: Secondary | ICD-10-CM | POA: Diagnosis not present

## 2019-10-10 DIAGNOSIS — Z743 Need for continuous supervision: Secondary | ICD-10-CM | POA: Diagnosis not present

## 2019-10-10 DIAGNOSIS — N1832 Chronic kidney disease, stage 3b: Secondary | ICD-10-CM | POA: Diagnosis present

## 2019-10-10 DIAGNOSIS — I129 Hypertensive chronic kidney disease with stage 1 through stage 4 chronic kidney disease, or unspecified chronic kidney disease: Secondary | ICD-10-CM | POA: Diagnosis not present

## 2019-10-10 DIAGNOSIS — D631 Anemia in chronic kidney disease: Secondary | ICD-10-CM | POA: Diagnosis not present

## 2019-10-10 DIAGNOSIS — Z6837 Body mass index (BMI) 37.0-37.9, adult: Secondary | ICD-10-CM | POA: Diagnosis not present

## 2019-10-10 DIAGNOSIS — Z7982 Long term (current) use of aspirin: Secondary | ICD-10-CM | POA: Diagnosis not present

## 2019-10-10 DIAGNOSIS — E8770 Fluid overload, unspecified: Secondary | ICD-10-CM | POA: Diagnosis not present

## 2019-10-10 DIAGNOSIS — Z7901 Long term (current) use of anticoagulants: Secondary | ICD-10-CM | POA: Diagnosis not present

## 2019-10-10 DIAGNOSIS — R404 Transient alteration of awareness: Secondary | ICD-10-CM | POA: Diagnosis not present

## 2019-10-10 DIAGNOSIS — R809 Proteinuria, unspecified: Secondary | ICD-10-CM | POA: Diagnosis not present

## 2019-10-10 DIAGNOSIS — W19XXXA Unspecified fall, initial encounter: Secondary | ICD-10-CM | POA: Diagnosis not present

## 2019-10-10 DIAGNOSIS — N179 Acute kidney failure, unspecified: Secondary | ICD-10-CM | POA: Diagnosis present

## 2019-10-10 DIAGNOSIS — R279 Unspecified lack of coordination: Secondary | ICD-10-CM | POA: Diagnosis not present

## 2019-10-10 DIAGNOSIS — E1122 Type 2 diabetes mellitus with diabetic chronic kidney disease: Secondary | ICD-10-CM | POA: Diagnosis present

## 2019-10-10 DIAGNOSIS — R600 Localized edema: Secondary | ICD-10-CM | POA: Diagnosis not present

## 2019-10-10 DIAGNOSIS — N183 Chronic kidney disease, stage 3 unspecified: Secondary | ICD-10-CM

## 2019-10-10 DIAGNOSIS — N1831 Chronic kidney disease, stage 3a: Secondary | ICD-10-CM | POA: Diagnosis not present

## 2019-10-10 DIAGNOSIS — I1310 Hypertensive heart and chronic kidney disease without heart failure, with stage 1 through stage 4 chronic kidney disease, or unspecified chronic kidney disease: Secondary | ICD-10-CM | POA: Diagnosis not present

## 2019-10-10 DIAGNOSIS — Z515 Encounter for palliative care: Secondary | ICD-10-CM | POA: Diagnosis not present

## 2019-10-10 DIAGNOSIS — R0602 Shortness of breath: Secondary | ICD-10-CM | POA: Diagnosis not present

## 2019-10-10 DIAGNOSIS — I248 Other forms of acute ischemic heart disease: Secondary | ICD-10-CM | POA: Diagnosis present

## 2019-10-10 LAB — CBC WITH DIFFERENTIAL/PLATELET
Abs Immature Granulocytes: 0.02 10*3/uL (ref 0.00–0.07)
Basophils Absolute: 0.1 10*3/uL (ref 0.0–0.1)
Basophils Relative: 1 %
Eosinophils Absolute: 0.1 10*3/uL (ref 0.0–0.5)
Eosinophils Relative: 2 %
HCT: 35.2 % — ABNORMAL LOW (ref 36.0–46.0)
Hemoglobin: 10.4 g/dL — ABNORMAL LOW (ref 12.0–15.0)
Immature Granulocytes: 0 %
Lymphocytes Relative: 20 %
Lymphs Abs: 1.1 10*3/uL (ref 0.7–4.0)
MCH: 23.9 pg — ABNORMAL LOW (ref 26.0–34.0)
MCHC: 29.5 g/dL — ABNORMAL LOW (ref 30.0–36.0)
MCV: 80.9 fL (ref 80.0–100.0)
Monocytes Absolute: 0.8 10*3/uL (ref 0.1–1.0)
Monocytes Relative: 15 %
Neutro Abs: 3.4 10*3/uL (ref 1.7–7.7)
Neutrophils Relative %: 62 %
Platelets: 221 10*3/uL (ref 150–400)
RBC: 4.35 MIL/uL (ref 3.87–5.11)
RDW: 19 % — ABNORMAL HIGH (ref 11.5–15.5)
WBC: 5.5 10*3/uL (ref 4.0–10.5)
nRBC: 0 % (ref 0.0–0.2)

## 2019-10-10 LAB — GLUCOSE, CAPILLARY
Glucose-Capillary: 63 mg/dL — ABNORMAL LOW (ref 70–99)
Glucose-Capillary: 73 mg/dL (ref 70–99)
Glucose-Capillary: 89 mg/dL (ref 70–99)
Glucose-Capillary: 80 mg/dL (ref 70–99)
Glucose-Capillary: 85 mg/dL (ref 70–99)
Glucose-Capillary: 128 mg/dL — ABNORMAL HIGH (ref 70–99)

## 2019-10-10 LAB — SARS CORONAVIRUS 2 (TAT 6-24 HRS): SARS Coronavirus 2: NEGATIVE

## 2019-10-10 LAB — COMPREHENSIVE METABOLIC PANEL
ALT: 12 U/L (ref 0–44)
AST: 24 U/L (ref 15–41)
Albumin: 3.5 g/dL (ref 3.5–5.0)
Alkaline Phosphatase: 72 U/L (ref 38–126)
Anion gap: 9 (ref 5–15)
BUN: 26 mg/dL — ABNORMAL HIGH (ref 8–23)
CO2: 29 mmol/L (ref 22–32)
Calcium: 8.9 mg/dL (ref 8.9–10.3)
Chloride: 104 mmol/L (ref 98–111)
Creatinine, Ser: 1.5 mg/dL — ABNORMAL HIGH (ref 0.44–1.00)
GFR calc Af Amer: 36 mL/min — ABNORMAL LOW (ref >60)
GFR calc non Af Amer: 31 mL/min — ABNORMAL LOW (ref >60)
Glucose, Bld: 98 mg/dL (ref 70–99)
Potassium: 3.9 mmol/L (ref 3.5–5.1)
Sodium: 142 mmol/L (ref 135–145)
Total Bilirubin: 0.8 mg/dL (ref 0.3–1.2)
Total Protein: 7.5 g/dL (ref 6.5–8.1)

## 2019-10-10 LAB — TSH: TSH: 1.604 u[IU]/mL (ref 0.350–4.500)

## 2019-10-10 LAB — TROPONIN I (HIGH SENSITIVITY)
Troponin I (High Sensitivity): 52 ng/L — ABNORMAL HIGH (ref <18)
Troponin I (High Sensitivity): 54 ng/L — ABNORMAL HIGH (ref <18)

## 2019-10-10 LAB — PROTIME-INR
INR: 3 — ABNORMAL HIGH (ref 0.8–1.2)
Prothrombin Time: 31.2 seconds — ABNORMAL HIGH (ref 11.4–15.2)

## 2019-10-10 LAB — BRAIN NATRIURETIC PEPTIDE: B Natriuretic Peptide: 771 pg/mL — ABNORMAL HIGH (ref 0.0–100.0)

## 2019-10-10 MED ORDER — ONDANSETRON HCL 4 MG PO TABS
4.0000 mg | ORAL_TABLET | Freq: Four times a day (QID) | ORAL | Status: DC | PRN
  Administered 2019-10-12 (×2): 4 mg via ORAL
  Filled 2019-10-10 (×2): qty 1

## 2019-10-10 MED ORDER — FUROSEMIDE 10 MG/ML IJ SOLN
80.0000 mg | Freq: Once | INTRAMUSCULAR | Status: AC
  Administered 2019-10-10: 80 mg via INTRAVENOUS
  Filled 2019-10-10: qty 8

## 2019-10-10 MED ORDER — PANTOPRAZOLE SODIUM 40 MG PO TBEC
40.0000 mg | DELAYED_RELEASE_TABLET | Freq: Every day | ORAL | Status: DC
  Administered 2019-10-10 – 2019-10-17 (×8): 40 mg via ORAL
  Filled 2019-10-10 (×8): qty 1

## 2019-10-10 MED ORDER — DILTIAZEM HCL ER COATED BEADS 240 MG PO CP24
240.0000 mg | ORAL_CAPSULE | Freq: Every day | ORAL | Status: DC
  Administered 2019-10-10 – 2019-10-17 (×8): 240 mg via ORAL
  Filled 2019-10-10 (×2): qty 2
  Filled 2019-10-10 (×2): qty 1
  Filled 2019-10-10 (×2): qty 2
  Filled 2019-10-10 (×2): qty 1
  Filled 2019-10-10 (×2): qty 2
  Filled 2019-10-10 (×4): qty 1
  Filled 2019-10-10: qty 2

## 2019-10-10 MED ORDER — ACETAMINOPHEN 325 MG PO TABS: 650.0000 mg | ORAL_TABLET | Freq: Four times a day (QID) | ORAL | Status: DC | PRN

## 2019-10-10 MED ORDER — SODIUM CHLORIDE 0.9% FLUSH
3.0000 mL | Freq: Two times a day (BID) | INTRAVENOUS | Status: DC
  Administered 2019-10-10 – 2019-10-17 (×9): 3 mL via INTRAVENOUS

## 2019-10-10 MED ORDER — ATORVASTATIN CALCIUM 10 MG PO TABS
10.0000 mg | ORAL_TABLET | Freq: Every day | ORAL | Status: DC
  Administered 2019-10-10 – 2019-10-17 (×8): 10 mg via ORAL
  Filled 2019-10-10 (×8): qty 1

## 2019-10-10 MED ORDER — ONDANSETRON HCL 4 MG/2ML IJ SOLN
4.0000 mg | Freq: Four times a day (QID) | INTRAMUSCULAR | Status: DC | PRN
  Administered 2019-10-13 – 2019-10-17 (×3): 4 mg via INTRAVENOUS
  Filled 2019-10-10 (×3): qty 2

## 2019-10-10 MED ORDER — INSULIN ASPART 100 UNIT/ML ~~LOC~~ SOLN
0.0000 [IU] | Freq: Three times a day (TID) | SUBCUTANEOUS | Status: DC
  Administered 2019-10-11: 5 [IU] via SUBCUTANEOUS
  Administered 2019-10-11: 18:00:00 3 [IU] via SUBCUTANEOUS
  Administered 2019-10-11 – 2019-10-12 (×4): 5 [IU] via SUBCUTANEOUS
  Administered 2019-10-13 (×2): 3 [IU] via SUBCUTANEOUS
  Administered 2019-10-13: 2 [IU] via SUBCUTANEOUS
  Administered 2019-10-14: 3 [IU] via SUBCUTANEOUS
  Administered 2019-10-14: 5 [IU] via SUBCUTANEOUS
  Administered 2019-10-14: 3 [IU] via SUBCUTANEOUS
  Administered 2019-10-15 (×3): 5 [IU] via SUBCUTANEOUS
  Administered 2019-10-16 (×2): 3 [IU] via SUBCUTANEOUS
  Administered 2019-10-16: 5 [IU] via SUBCUTANEOUS
  Administered 2019-10-17: 3 [IU] via SUBCUTANEOUS
  Administered 2019-10-17: 5 [IU] via SUBCUTANEOUS
  Filled 2019-10-10 (×20): qty 1

## 2019-10-10 MED ORDER — FUROSEMIDE 10 MG/ML IJ SOLN
40.0000 mg | Freq: Two times a day (BID) | INTRAMUSCULAR | Status: DC
  Administered 2019-10-10 – 2019-10-11 (×3): 40 mg via INTRAVENOUS
  Filled 2019-10-10 (×3): qty 4

## 2019-10-10 MED ORDER — WARFARIN - PHARMACIST DOSING INPATIENT: Freq: Every day | Status: DC

## 2019-10-10 MED ORDER — HYDRALAZINE HCL 25 MG PO TABS
37.5000 mg | ORAL_TABLET | Freq: Three times a day (TID) | ORAL | Status: DC
  Administered 2019-10-10 – 2019-10-14 (×13): 37.5 mg via ORAL
  Filled 2019-10-10 (×13): qty 2

## 2019-10-10 MED ORDER — SODIUM CHLORIDE 0.9 % IV SOLN: 250.0000 mL | INTRAVENOUS | Status: DC | PRN

## 2019-10-10 MED ORDER — INSULIN ASPART 100 UNIT/ML ~~LOC~~ SOLN
0.0000 [IU] | Freq: Every day | SUBCUTANEOUS | Status: DC
  Administered 2019-10-11 – 2019-10-15 (×3): 2 [IU] via SUBCUTANEOUS
  Filled 2019-10-10 (×4): qty 1

## 2019-10-10 MED ORDER — ACETAMINOPHEN 650 MG RE SUPP: 650.0000 mg | Freq: Four times a day (QID) | RECTAL | Status: DC | PRN

## 2019-10-10 MED ORDER — CARVEDILOL 6.25 MG PO TABS
6.2500 mg | ORAL_TABLET | Freq: Two times a day (BID) | ORAL | Status: DC
  Administered 2019-10-10 – 2019-10-17 (×14): 6.25 mg via ORAL
  Filled 2019-10-10 (×14): qty 1

## 2019-10-10 MED ORDER — ONDANSETRON HCL 4 MG/2ML IJ SOLN: 4.0000 mg | Freq: Four times a day (QID) | INTRAMUSCULAR | Status: DC | PRN

## 2019-10-10 MED ORDER — ASPIRIN EC 81 MG PO TBEC
81.0000 mg | DELAYED_RELEASE_TABLET | Freq: Every day | ORAL | Status: DC
  Administered 2019-10-10 – 2019-10-17 (×8): 81 mg via ORAL
  Filled 2019-10-10 (×8): qty 1

## 2019-10-10 MED ORDER — SODIUM CHLORIDE 0.9% FLUSH
3.0000 mL | Freq: Two times a day (BID) | INTRAVENOUS | Status: DC
  Administered 2019-10-10 – 2019-10-17 (×12): 3 mL via INTRAVENOUS

## 2019-10-10 MED ORDER — WARFARIN SODIUM 2 MG PO TABS
2.0000 mg | ORAL_TABLET | Freq: Once | ORAL | Status: AC
  Administered 2019-10-10: 2 mg via ORAL
  Filled 2019-10-10: qty 1

## 2019-10-10 MED ORDER — SENNOSIDES-DOCUSATE SODIUM 8.6-50 MG PO TABS: 1.0000 | ORAL_TABLET | Freq: Every evening | ORAL | Status: DC | PRN

## 2019-10-10 MED ORDER — SODIUM CHLORIDE 0.9% FLUSH: 3.0000 mL | INTRAVENOUS | Status: DC | PRN

## 2019-10-10 MED ORDER — IPRATROPIUM-ALBUTEROL 0.5-2.5 (3) MG/3ML IN SOLN
3.0000 mL | Freq: Four times a day (QID) | RESPIRATORY_TRACT | Status: DC | PRN
  Administered 2019-10-10: 3 mL via RESPIRATORY_TRACT
  Filled 2019-10-10: qty 3

## 2019-10-10 MED ORDER — ACETAMINOPHEN 325 MG PO TABS
650.0000 mg | ORAL_TABLET | ORAL | Status: DC | PRN
  Administered 2019-10-17: 650 mg via ORAL
  Filled 2019-10-10: qty 2

## 2019-10-10 NOTE — ED Notes (Signed)
Patient given OJ to drink

## 2019-10-10 NOTE — Progress Notes (Signed)
ANTICOAGULATION CONSULT NOTE - Initial Consult  Pharmacy Consult for Warfarin (home medication) Indication: atrial fibrillation  No Known Allergies  Patient Measurements: Height: 5\' 2"  (157.5 cm) Weight: 94.6 kg (208 lb 8.9 oz) IBW/kg (Calculated) : 50.1  Vital Signs: Temp: 98.4 F (36.9 C) (04/04 2347) Temp Source: Oral (04/04 2347) BP: 147/77 (04/05 0130) Pulse Rate: 65 (04/05 0130)  Labs: Recent Labs    10/09/19 2348  HGB 10.4*  HCT 35.2*  PLT 221  LABPROT 31.2*  INR 3.0*  CREATININE 1.50*  TROPONINIHS 54*    Estimated Creatinine Clearance: 28.3 mL/min (A) (by C-G formula based on SCr of 1.5 mg/dL (H)).   Medical History: Past Medical History:  Diagnosis Date  . Anemia   . CHF (congestive heart failure) ([place])   . Chronic kidney disease   . COPD (chronic obstructive pulmonary disease) ([place])   . Diabetes mellitus without complication ([place])   . Hypertension     Medications:  (Not in a hospital admission)   Assessment: Pharmacy asked to manage Warfarin per protocol.  INR 3.0  Goal of Therapy:  INR 2-3 Monitor platelets by anticoagulation protocol: Yes   Plan:  No warfarin tonight, f/u labs in am  Hart Robinsons A 10/10/2019,2:15 AM

## 2019-10-10 NOTE — Progress Notes (Signed)
D: Pt alert and oriented x 4.  Pt denies experiencing any pain at this time. Pt observed having SOB with accessory muscle use. Pt uses two pillows and likes to sit completely upright.  A: Scheduled medications administered to pt, per MD orders. Support and encouragement provided. Frequent verbal contact made.    R: No adverse drug reactions noted. Pt complaint with medications and treatment plan. Pt interacts well with staff on the unit. Will continue to monitor and provide care for as ordered.

## 2019-10-10 NOTE — Progress Notes (Addendum)
ANTICOAGULATION CONSULT NOTE - Follow up [facility] for Warfarin (home medication) Indication: atrial fibrillation  No Known Allergies  Patient Measurements: Height: 5\' 2"  (157.5 cm) Weight: 91.9 kg (202 lb 11.2 oz) IBW/kg (Calculated) : 50.1  Vital Signs: Temp: 97.5 F (36.4 C) (04/05 0733) Temp Source: Oral (04/05 0733) BP: 162/64 (04/05 0733) Pulse Rate: 66 (04/05 0733)  Labs: Recent Labs    10/09/19 2348 10/10/19 0231  HGB 10.4*  --   HCT 35.2*  --   PLT 221  --   LABPROT 31.2*  --   INR 3.0*  --   CREATININE 1.50*  --   TROPONINIHS 54* 52*    Estimated Creatinine Clearance: 27.9 mL/min (A) (by C-G formula based on SCr of 1.5 mg/dL (H)).   Medical History: Past Medical History:  Diagnosis Date  . Anemia   . CHF (congestive heart failure) ([place])   . Chronic kidney disease   . COPD (chronic obstructive pulmonary disease) ([place])   . Diabetes mellitus without complication ([place])   . Hypertension     Assessment: Pharmacy asked to manage Warfarin per protocol in this 86 yo female on warfarin PTA for AFib.  INR 3.0 on admission. Home warfarin regimen per PTA med list: 5 mg every day except 7.5 mg on Thursdays, last dose 4/4 PTA.   Goal of Therapy:  INR 2-3 - INR goal confirmed with hospitalist  Monitor platelets by anticoagulation protocol: Yes   Plan:  INR 3.0 within goal range but borderline, possibly due to CHF exacerbation.  Will give smaller dose tonight - warfarin 2 mg PO x1 tonight Daily INR and CBC at least every three days per policy  Pharmacy will continue to follow.   Rocky Morel 10/10/2019,9:22 AM

## 2019-10-10 NOTE — ED Provider Notes (Signed)
[facility] Emergency Department Provider Note  ____________________________________________  Time seen: Approximately 12:56 AM  I have reviewed the triage vital signs and the nursing notes.   HISTORY  Chief Complaint Shortness of Breath    HPI Kimberly Peterson is a 86 y.o. female with a history of COPD CHF diabetes hypertension who comes the ED with worsening shortness of breath for the past 2 days, dyspnea on exertion.  She is very uncomfortable laying back, feels much more comfortable sitting upright, she has had to sleep in a chair for the last 2 nights.  Denies chest pain.  Symptoms are constant, severe  Recently was hospitalized for CHF exacerbation and diuresed.  From chart review, weight on discharge was 82 kg.      Past Medical History:  Diagnosis Date  . Anemia   . CHF (congestive heart failure) ([place])   . Chronic kidney disease   . COPD (chronic obstructive pulmonary disease) ([place])   . Diabetes mellitus without complication ([place])   . Hypertension      Patient Active Problem List   Diagnosis Date Noted  . Gastroesophageal reflux disease without esophagitis 09/26/2019  . Cutaneous candidiasis 09/26/2019  . Acute CHF (congestive heart failure) ([place]) 09/15/2019  . Acute on chronic diastolic CHF (congestive heart failure) ([place]) 09/15/2019  . Heart failure ([place]) 09/15/2019  . SOB (shortness of breath) 09/14/2019  . Encounter for general adult medical examination with abnormal findings 07/25/2019  . Long-term (current) use of anticoagulants, INR goal 2.0-3.0 07/25/2019  . Hospital discharge follow-up 07/25/2019  . Iron deficiency anemia   . Type 2 diabetes, uncontrolled, with renal manifestation ([place]) 07/11/2019  . Microcytic anemia 07/11/2019  . Lactic acidosis 07/11/2019  . Hypokalemia 07/11/2019  . Elevated troponin 07/11/2019  . Acute renal failure superimposed on stage 3a chronic kidney disease ([place]) 07/11/2019  . Acute on chronic  diastolic heart failure ([place]) 04/27/2019  . Acute and chronic respiratory failure with hypoxia ([place]) 03/01/2019  . Acute respiratory failure with hypoxia ([place]) 02/07/2019  . Type 2 diabetes with peripheral circulatory disorder, controlled ([place]) 10/17/2018  . Complete heart block ([place]) 09/09/2018  . Severe aortic valve stenosis 09/09/2018  . Lymphedema 08/12/2018  . Uncontrolled type 2 diabetes mellitus with hyperglycemia ([place]) 02/25/2018  . Atrial fibrillation ([place]) 02/25/2018  . Encounter for current long-term use of anticoagulants 02/25/2018  . Dependence on supplemental oxygen 02/25/2018  . Lower extremity edema 02/25/2018  . CHF (congestive heart failure) ([place]) 12/04/2015  . Diabetes mellitus ([place]) 12/04/2015  . Chronic obstructive pulmonary disease ([place]) 09/20/2015  . Diastolic CHF, acute on chronic (HCC) 09/19/2015  . Benign essential HTN 09/19/2015  . Controlled diabetes mellitus without complication, with long-term current use of insulin ([place]) 09/19/2015  . Hyperlipidemia, mixed 09/04/2014  . Pulmonary embolism ([place]) 09/23/2013     Past Surgical History:  Procedure Laterality Date  . aoric valve replacemet      st Jude  . CATARACT EXTRACTION, BILATERAL    . COLONOSCOPY WITH PROPOFOL N/A 07/17/2019   Procedure: COLONOSCOPY WITH PROPOFOL;  Surgeon: Lin Landsman, MD;  Location: [facility] ENDOSCOPY;  Service: Gastroenterology;  Laterality: N/A;  . ESOPHAGOGASTRODUODENOSCOPY N/A 07/17/2019   Procedure: ESOPHAGOGASTRODUODENOSCOPY (EGD);  Surgeon: Lin Landsman, MD;  Location: [facility] ENDOSCOPY;  Service: Gastroenterology;  Laterality: N/A;  . PACEMAKER PLACEMENT    . VALVE REPLACEMENT       Prior to Admission medications   Medication Sig Start Date End Date Taking? Authorizing Provider  aspirin 81  MG tablet Take 81 mg by mouth daily.    [provider]  atorvastatin (LIPITOR) 10 MG tablet TAKE 1 TABLET BY MOUTH ONCE DAILY FOR CHOLESTEROL Patient taking  differently: Take 10 mg by mouth daily.  12/06/18   Scar[place], Audie Clear, NP  CARTIA XT 240 MG 24 hr capsule Take 1 capsule by mouth once daily Patient taking differently: Take 240 mg by mouth daily.  04/06/19   [name], MD  carvedilol (COREG) 6.25 MG tablet Take 6.25 mg by mouth 2 (two) times daily with a meal.     [provider]  hydrALAZINE (APRESOLINE) 25 MG tablet Take 1.5 tablets (37.5 mg total) by mouth 3 (three) times daily. 09/22/19   Enzo Bi, MD  insulin aspart protamine- aspart (NOVOLOG MIX 70/30) (70-30) 100 UNIT/ML injection Inject 0.2 mLs (20 Units total) into the skin 2 (two) times daily with a meal. 09/22/19   Enzo Bi, MD  ipratropium-albuterol (DUONEB) 0.5-2.5 (3) MG/3ML SOLN Take 3 mLs by nebulization every 6 (six) hours as needed. Dx-shortness of breath    [provider]  nystatin ointment (MYCOSTATIN) Apply 1 application topically 2 (two) times daily. 09/26/19   Ronnell Freshwater, NP  pantoprazole (PROTONIX) 40 MG tablet TAKE 1 TABLET BY MOUTH ONCE DAILY FOR STOMACH 09/26/19   Ronnell Freshwater, NP  torsemide ([name]) 20 MG tablet Take 2 tablets (40 mg total) by mouth daily. 05/01/19   Hillary Bow, MD  warfarin (COUMADIN) 5 MG tablet Take 1-1.5 tablets (5-7.5 mg total) by mouth See admin instructions. Take 1 tablet (5mg ) by mouth every Monday, Tuesday. Wednesday, Friday, Saturday and Sunday evening and take 1 tablets (7.5mg ) by mouth every Thursday evening 07/18/19   Dhungel, Flonnie Overman, MD     Allergies Patient has no known allergies.   History reviewed. No pertinent family history.  Social History Social History   Tobacco Use  . Smoking status: Never Smoker  . Smokeless tobacco: Never Used  Substance Use Topics  . Alcohol use: No  . Drug use: No    Review of Systems  Constitutional:   No fever or chills.  ENT:   No sore throat. No rhinorrhea. Cardiovascular:   No chest pain or syncope. Respiratory: Positive shortness of breath as above  without cough. Gastrointestinal:   Negative for abdominal pain, vomiting and diarrhea.  Musculoskeletal:   Negative for focal pain or swelling All other systems reviewed and are negative except as documented above in ROS and HPI.  ____________________________________________   PHYSICAL EXAM:  VITAL SIGNS: ED Triage Vitals  Enc Vitals Group     BP 10/09/19 2348 (!) 163/79     Pulse Rate 10/09/19 2347 65     Resp 10/09/19 2347 (!) 28     Temp 10/09/19 2347 98.4 F (36.9 C)     Temp Source 10/09/19 2347 Oral     SpO2 10/09/19 2347 93 %     Weight 10/09/19 2348 208 lb 8.9 oz (94.6 kg)     Height 10/09/19 2348 5\' 2"  (1.575 m)     Head Circumference --      Peak Flow --      Pain Score 10/09/19 2348 0     Pain Loc --      Pain Edu? --      Excl. in [place]? --     Vital signs reviewed, nursing assessments reviewed.   Constitutional:   Alert and oriented. Non-toxic appearance. Eyes:   Conjunctivae are normal. EOMI.  PERRL. ENT      Head:   Normocephalic and atraumatic.      Nose:   Wearing a mask.      Mouth/Throat:   Wearing a mask.      Neck:   No meningismus. Full ROM. Hematological/Lymphatic/Immunilogical:   No cervical lymphadenopathy. Cardiovascular:   RRR. Symmetric bilateral radial and DP pulses.  No murmurs. Cap refill less than 2 seconds. Respiratory:   Tachypnea.  No accessory muscle use.  Diminished breath sounds bilateral bases. Gastrointestinal:   Soft and nontender. Non distended. There is no CVA tenderness.  No rebound, rigidity, or guarding.  Musculoskeletal:   Normal range of motion in all extremities. No joint effusions.  No lower extremity tenderness.  3+ pitting edema bilateral lower extremities. Neurologic:   Normal speech and language.  Motor grossly intact. No acute focal neurologic deficits are appreciated.  Skin:    Skin is warm, dry and intact. No rash noted.  No petechiae, purpura, or bullae.  ____________________________________________    LABS  (pertinent positives/negatives) (all labs ordered are listed, but only abnormal results are displayed) Labs Reviewed  COMPREHENSIVE METABOLIC PANEL - Abnormal; Notable for the following components:      Result Value   BUN 26 (*)    Creatinine, Ser 1.50 (*)    GFR calc non Af Amer 31 (*)    GFR calc Af Amer 36 (*)    All other components within normal limits  BRAIN NATRIURETIC PEPTIDE - Abnormal; Notable for the following components:   B Natriuretic Peptide 771.0 (*)    All other components within normal limits  CBC WITH DIFFERENTIAL/PLATELET - Abnormal; Notable for the following components:   Hemoglobin 10.4 (*)    HCT 35.2 (*)    MCH 23.9 (*)    MCHC 29.5 (*)    RDW 19.0 (*)    All other components within normal limits  TROPONIN I (HIGH SENSITIVITY) - Abnormal; Notable for the following components:   Troponin I (High Sensitivity) 54 (*)    All other components within normal limits  SARS CORONAVIRUS 2 (TAT 6-24 HRS)  PROTIME-INR   ____________________________________________   EKG  Interpreted by me Sinus rhythm rate of 65, right axis, left bundle branch block.  No acute ischemic changes.  ____________________________________________    RADIOLOGY  DG Chest Portable 1 View  Result Date: 10/10/2019 CLINICAL DATA:  Dyspnea on exertion EXAM: PORTABLE CHEST 1 VIEW COMPARISON:  September 21, 2019 FINDINGS: There is a moderate to large right-sided pleural effusion. There is a small left-sided pleural effusion. There is a dual chamber pacemaker in place. The heart size is enlarged. Aortic calcifications are noted. The patient is status post prior median sternotomy. There is no pneumothorax. Vascular congestion with developing pulmonary edema is noted. IMPRESSION: Cardiomegaly with developing pulmonary edema and bilateral pleural effusions, right greater than left. Electronically Signed   By: Constance Holster M.D.   On: 10/10/2019 00:25     ____________________________________________   PROCEDURES Procedures  ____________________________________________  DIFFERENTIAL DIAGNOSIS   Pulmonary edema, pleural effusion, pneumonia, CHF exacerbation, NSTEMI  CLINICAL IMPRESSION / ASSESSMENT AND PLAN / ED COURSE  Medications ordered in the ED: Medications  furosemide (LASIX) injection 80 mg (has no administration in time range)    Pertinent labs & imaging results that were available during my care of the patient were reviewed by me and considered in my medical decision making (see chart for details).  Kimberly Peterson was evaluated in Emergency Department on 10/10/2019  for the symptoms described in the history of present illness. She was evaluated in the context of the global COVID-19 pandemic, which necessitated consideration that the patient might be at risk for infection with the SARS-CoV-2 virus that causes COVID-19. Institutional protocols and algorithms that pertain to the evaluation of patients at risk for COVID-19 are in a state of rapid change based on information released by regulatory bodies including the CDC and federal and state organizations. These policies and algorithms were followed during the patient's care in the ED.   Patient presents with worsening shortness of breath, dyspnea on exertion, orthopnea, severe peripheral edema, all consistent with a CHF exacerbation.  Additionally, it appears that her weight is 12 kg higher today than it was 2 weeks ago on last hospital discharge consistent with volume overload.  Maintaining her oxygen saturation at 92% on her baseline 2 L nasal cannula, increased to 4 L for comfort.  Chest x-ray shows pulmonary edema and bilateral pleural effusions.  Labs show baseline mild elevation of troponin.  BNP significantly elevated.  Blood count unremarkable, chemistry panel unremarkable with baseline CKD.  Vitals have remained stable in the ED.  Ordered IV Lasix 80 mg for initial diuresis,  will plan to admit for further management of her CHF exacerbation.  No evidence of sepsis, not STEMI.      ____________________________________________   FINAL CLINICAL IMPRESSION(S) / ED DIAGNOSES    Final diagnoses:  Acute on chronic diastolic congestive heart failure (HCC)  Chronic respiratory failure with hypoxia (HCC)  Bilateral pleural effusion     ED Discharge Orders    None      Portions of this note were generated with dragon dictation software. Dictation errors may occur despite best attempts at proofreading.   Carrie Mew, MD 10/10/19 [phone]

## 2019-10-10 NOTE — Progress Notes (Signed)
Notified by nursing that patient was quite sob.Administered duoneb svn with little improvement.Then placed on auto cpap with 5 liters with noticeable improvement.Nursing notified

## 2019-10-10 NOTE — Progress Notes (Signed)
At this time pt up to the floor. Pt not able to answer questions at this time. Pt cannot breathe and asking where is the cpap machine at.

## 2019-10-10 NOTE — H&P (Signed)
History and Physical    Kimberly Peterson [id number] DOB: 18-Feb-1930 DOA: 10/09/2019  PCP: [name], MD   Patient coming from: home I have personally briefly reviewed patient's old medical records in [place]  Chief Complaint: shortness of breath  HPI: Kimberly Peterson is a 86 y.o. female with medical history significant for diastolic CHF, CKD 3, COPD, DM 2, HTN, chronic respiratory failure on home O2 2 to 3 L, chronic A. fib on Coumadin, OSA on CPAP and history of pacemaker valve replacement, hospitalized from 09/15/2019 to 09/22/2019 with CHF exacerbation who presents again to the emergency room with the of shortness of breath, orthopnea and worsening lower extremity edema she denies chest pain, cough fever or chills. Most of the history is taken from daughter over the phone as patient is dyspneic with conversation  ED Course: On arrival in the emergency room tachypneic at 28 with BP 163/79 and otherwise normal vitals.  O2 sat was 90 to 92% on home flow rate of 2 L and she was bumped up to 4 L.  Troponin 54, BNP 771, EKG with nonspecific ST-T wave changes.  Chest x-ray shows developing pulmonary edema and bilateral pleural effusion right greater than left.  Other blood work mostly unremarkable.  Patient was given a dose of Lasix.  Hospitalist consulted for admission  Review of Systems: Difficult to obtain due to clinical condition  Past Medical History:  Diagnosis Date  . Anemia   . CHF (congestive heart failure) ([place])   . Chronic kidney disease   . COPD (chronic obstructive pulmonary disease) ([place])   . Diabetes mellitus without complication ([place])   . Hypertension     Past Surgical History:  Procedure Laterality Date  . aoric valve replacemet      st Jude  . CATARACT EXTRACTION, BILATERAL    . COLONOSCOPY WITH PROPOFOL N/A 07/17/2019   Procedure: COLONOSCOPY WITH PROPOFOL;  Surgeon: Lin Landsman, MD;  Location[facility] ENDOSCOPY;  Service: Gastroenterology;   Laterality: N/A;  . ESOPHAGOGASTRODUODENOSCOPY N/A 07/17/2019   Procedure: ESOPHAGOGASTRODUODENOSCOPY (EGD);  Surgeon: Lin Landsman, MD;  Location: [facility] ENDOSCOPY;  Service: Gastroenterology;  Laterality: N/A;  . PACEMAKER PLACEMENT    . VALVE REPLACEMENT       reports that she has never smoked. She has never used smokeless tobacco. She reports that she does not drink alcohol or use drugs.  No Known Allergies  History reviewed. No pertinent family history.   Prior to Admission medications   Medication Sig Start Date End Date Taking? Authorizing Provider  aspirin 81 MG tablet Take 81 mg by mouth daily.    [provider]  atorvastatin (LIPITOR) 10 MG tablet TAKE 1 TABLET BY MOUTH ONCE DAILY FOR CHOLESTEROL Patient taking differently: Take 10 mg by mouth daily.  12/06/18   Scarboro, Audie Clear, NP  CARTIA XT 240 MG 24 hr capsule Take 1 capsule by mouth once daily Patient taking differently: Take 240 mg by mouth daily.  04/06/19   [name], MD  carvedilol (COREG) 6.25 MG tablet Take 6.25 mg by mouth 2 (two) times daily with a meal.     [provider]  hydrALAZINE (APRESOLINE) 25 MG tablet Take 1.5 tablets (37.5 mg total) by mouth 3 (three) times daily. 09/22/19   Enzo Bi, MD  insulin aspart protamine- aspart (NOVOLOG MIX 70/30) (70-30) 100 UNIT/ML injection Inject 0.2 mLs (20 Units total) into the skin 2 (two) times daily with a meal. 09/22/19  Enzo Bi, MD  ipratropium-albuterol (DUONEB) 0.5-2.5 (3) MG/3ML SOLN Take 3 mLs by nebulization every 6 (six) hours as needed. Dx-shortness of breath    [provider]  nystatin ointment (MYCOSTATIN) Apply 1 application topically 2 (two) times daily. 09/26/19   Ronnell Freshwater, NP  pantoprazole (PROTONIX) 40 MG tablet TAKE 1 TABLET BY MOUTH ONCE DAILY FOR STOMACH 09/26/19   Ronnell Freshwater, NP  torsemide ([name]) 20 MG tablet Take 2 tablets (40 mg total) by mouth daily. 05/01/19   Hillary Bow, MD  warfarin  (COUMADIN) 5 MG tablet Take 1-1.5 tablets (5-7.5 mg total) by mouth See admin instructions. Take 1 tablet (5mg ) by mouth every Monday, Tuesday. Wednesday, Friday, Saturday and Sunday evening and take 1 tablets (7.5mg ) by mouth every Thursday evening 07/18/19   Louellen Molder, MD    Physical Exam: Vitals:   10/09/19 2347 10/09/19 2348 10/10/19 0000 10/10/19 0030  BP:  (!) 163/79 (!) 173/69 (!) 150/70  Pulse: 65 66 68 65  Resp: (!) 28 (!) 25 (!) 26 (!) 26  Temp: 98.4 F (36.9 C)     TempSrc: Oral     SpO2: 93% 92% 97% 99%  Weight:  94.6 kg    Height:  5\' 2"  (1.575 m)       Vitals:   10/09/19 2347 10/09/19 2348 10/10/19 0000 10/10/19 0030  BP:  (!) 163/79 (!) 173/69 (!) 150/70  Pulse: 65 66 68 65  Resp: (!) 28 (!) 25 (!) 26 (!) 26  Temp: 98.4 F (36.9 C)     TempSrc: Oral     SpO2: 93% 92% 97% 99%  Weight:  94.6 kg    Height:  5\' 2"  (1.575 m)      Constitutional: Alert and awake, oriented x3, conversational dyspnea, sitting upright in stretcher  Eyes: PERLA, EOMI, irises appear normal, anicteric sclera,  ENMT: external ears and nose appear normal, normal hearing             Lips appears normal, oropharynx mucosa, tongue, posterior pharynx appear normal  Neck: neck appears normal, no masses, normal ROM, no thyromegaly, + JVD  CVS: S1-S2 clear, no murmur rubs or gallops, no carotid bruits, p 3+ pitting edema Respiratory: Diminished bilaterally with bibasilar rales, no rhonchi. Respiratory effort normal increased. No accessory muscle use.  Abdomen: soft nontender, nondistended, normal bowel sounds, no hepatosplenomegaly, no hernias Musculoskeletal: : no cyanosis, clubbing , no contractures or atrophy Neuro: Cranial nerves II-XII intact, sensation, reflexes normal, strength Psych: judgement and insight appear normal, stable mood and affect,  Skin: no rashes or lesions or ulcers, no induration or nodules   Labs on Admission: I have personally reviewed following labs and imaging  studies  CBC: Recent Labs  Lab 10/09/19 2348  WBC 5.5  NEUTROABS 3.4  HGB 10.4*  HCT 35.2*  MCV 80.9  PLT [id number]   Basic Metabolic Panel: Recent Labs  Lab 10/09/19 2348  NA 142  K 3.9  CL 104  CO2 29  GLUCOSE 98  BUN 26*  CREATININE 1.50*  CALCIUM 8.9   GFR: Estimated Creatinine Clearance: 28.3 mL/min (A) (by C-G formula based on SCr of 1.5 mg/dL (H)). Liver Function Tests: Recent Labs  Lab 10/09/19 2348  AST 24  ALT 12  ALKPHOS 72  BILITOT 0.8  PROT 7.5  ALBUMIN 3.5   No results for input(s): LIPASE, AMYLASE in the last 168 hours. No results for input(s): AMMONIA in the last 168 hours. Coagulation Profile: No results for  input(s): INR, PROTIME in the last 168 hours. Cardiac Enzymes: No results for input(s): CKTOTAL, CKMB, CKMBINDEX, TROPONINI in the last 168 hours. BNP (last 3 results) No results for input(s): PROBNP in the last 8760 hours. HbA1C: No results for input(s): HGBA1C in the last 72 hours. CBG: No results for input(s): GLUCAP in the last 168 hours. Lipid Profile: No results for input(s): CHOL, HDL, LDLCALC, TRIG, CHOLHDL, LDLDIRECT in the last 72 hours. Thyroid Function Tests: No results for input(s): TSH, T4TOTAL, FREET4, T3FREE, THYROIDAB in the last 72 hours. Anemia Panel: No results for input(s): VITAMINB12, FOLATE, FERRITIN, TIBC, IRON, RETICCTPCT in the last 72 hours. Urine analysis:    Component Value Date/Time   COLORURINE YELLOW (A) 07/11/2019 1242   APPEARANCEUR CLEAR (A) 07/11/2019 1242   APPEARANCEUR Clear 06/09/2018 1502   LABSPEC 1.013 07/11/2019 1242   LABSPEC 1.012 09/07/2013 1418   PHURINE 5.0 07/11/2019 1242   GLUCOSEU NEGATIVE 07/11/2019 1242   GLUCOSEU Negative 09/07/2013 1418   HGBUR NEGATIVE 07/11/2019 1242   BILIRUBINUR NEGATIVE 07/11/2019 1242   BILIRUBINUR Negative 06/09/2018 1502   BILIRUBINUR Negative 09/07/2013 1418   KETONESUR NEGATIVE 07/11/2019 1242   PROTEINUR 100 (A) 07/11/2019 1242   UROBILINOGEN 0.2  11/21/2013 1253   NITRITE NEGATIVE 07/11/2019 1242   LEUKOCYTESUR NEGATIVE 07/11/2019 1242   LEUKOCYTESUR 3+ 09/07/2013 1418    Radiological Exams on Admission: DG Chest Portable 1 View  Result Date: 10/10/2019 CLINICAL DATA:  Dyspnea on exertion EXAM: PORTABLE CHEST 1 VIEW COMPARISON:  September 21, 2019 FINDINGS: There is a moderate to large right-sided pleural effusion. There is a small left-sided pleural effusion. There is a dual chamber pacemaker in place. The heart size is enlarged. Aortic calcifications are noted. The patient is status post prior median sternotomy. There is no pneumothorax. Vascular congestion with developing pulmonary edema is noted. IMPRESSION: Cardiomegaly with developing pulmonary edema and bilateral pleural effusions, right greater than left. Electronically Signed   By: Constance Holster M.D.   On: 10/10/2019 00:25    EKG: Independently reviewed.   Assessment/Plan    Acute on chronic diastolic heart failure ([place]) -Patient discharged just 2 weeks prior with heart failure exacerbation now presenting with dyspnea at rest, pedal edema -IV Lasix -Continue Coreg.  Not on ACE/ARB likely secondary to CKD -Daily weights, intake and output monitoring, salt and fluid restriction -Continue to trend troponins -Echocardiogram in the a.m. -Discussed patient's overall poor outlook with the daughter, who states that her mother said she was feeling 'tired' and 'ready to go home to the [place]'.  Daughter was clear that she wants mother to be full code but will not let her linger on the ventilator.    Atrial fibrillation (HCC) -Continue Coumadin, continue Coreg -Pharmacy to adjust Coumadin based on INR    Elevated troponin -Troponin 54 suspect related to demand ischemia -Continue to trend    Chronic respiratory failure with hypoxia (HCC) -Recently discharged on 2 to 3 L home O2 at rest and 6 L with ambulation -O2 requirement at baseline    H/O aortic valve replacement -no  acute issues  Pacemaker -No acute disease suspected.  Patient gets her routine pacemaker checks -Follows with Dr. [name] at [facility]    Benign essential HTN -continue Cartia, carvedilol and hydralazine    Chronic obstructive pulmonary disease ([place]) -DuoNebs as needed    Type 2 diabetes with peripheral circulatory disorder, controlled ([place]) -Sliding scale insulin for now pending med rec    CKD (chronic kidney disease) stage  3, GFR 30-59 ml/min -Renal function at baseline    DVT prophylaxis:coumadin Code Status: full code  Family Communication:  Daughter, Kimberly Peterson: says patient is a full code  Disposition Plan: Back to previous home environment Consults called: none  Status:inp    Athena Masse MD [facility]     10/10/2019, 1:36 AM

## 2019-10-10 NOTE — Progress Notes (Signed)
Inpatient Diabetes Program Recommendations  AACE/ADA: New Consensus Statement on Inpatient Glycemic Control (2015)  Target Ranges:  Prepandial:   less than 140 mg/dL      Peak postprandial:   less than 180 mg/dL (1-2 hours)      Critically ill patients:  140 - 180 mg/dL   Lab Results  Component Value Date   GLUCAP 80 10/10/2019   HGBA1C 6.2 (H) 09/15/2019    Review of Glycemic Control Results for LAKEIDRA, SCHACHT (MRN [id number]) as of 10/10/2019 13:06  Ref. Range 10/10/2019 02:32 10/10/2019 04:27 10/10/2019 08:20 10/10/2019 11:45  Glucose-Capillary Latest Ref Range: 70 - 99 mg/dL 63 (L) 73 89 80   Diabetes history: DM 2 Outpatient Diabetes medications: 70/30 20 units bid Current orders for Inpatient glycemic control:  Novolog 0-15 units tid + hs  Inpatient Diabetes Program Recommendations:    Glucose trends lower below 100 without use of insulin.  Due to elevated renal function and age, consider decreasing Novolog Correction scale to "sensitive" 0-9 units.  Thanks,  Tama Headings RN, MSN, BC-ADM Inpatient Diabetes Coordinator Team Pager [phone] (8a-5p)

## 2019-10-10 NOTE — Progress Notes (Signed)
Progress Note    Kimberly Peterson  [id number] DOB: 01/26/1930  DOA: 10/09/2019 PCP: [name], MD      Brief Narrative:    Medical records reviewed and are as summarized below:  Kimberly Peterson is an 86 y.o. female with medical history significant for diastolic CHF, CKD 3, COPD, DM 2, HTN, chronic respiratory failure on home O2 2 to 3 L, chronic A. fib on Coumadin, OSA on CPAP and history of pacemaker and aortic valve replacement, hospitalized from 09/15/2019 to 09/22/2019 with CHF exacerbation who presented  to the emergency room with the of shortness of breath, orthopnea and worsening lower extremity edema.      Assessment/Plan:   Active Problems:   Benign essential HTN   Chronic obstructive pulmonary disease (HCC)   Atrial fibrillation (HCC)   Type 2 diabetes with peripheral circulatory disorder, controlled (HCC)   Acute on chronic diastolic heart failure (HCC)   Elevated troponin   CKD (chronic kidney disease) stage 3, GFR 30-59 ml/min   Chronic respiratory failure with hypoxia (HCC)   H/O aortic valve replacement   Acute on chronic diastolic (congestive) heart failure (HCC)   Acute on chronic diastolic heart failure ([place]) -Patient discharged just 2 weeks prior with heart failure exacerbation now presenting with dyspnea at rest, pedal edema -IV Lasix -Continue Coreg.  Not on ACE/ARB likely secondary to CKD -Daily weights, intake and output monitoring, salt and fluid restriction -Echocardiogram is pending.    Atrial fibrillation ([place]) -Continue Coumadin, continue Coreg -Pharmacy to adjust Coumadin based on INR    Mildly elevated troponin -Troponins are flat and may be related to demand ischemia from CHF exacerbation.    Chronic respiratory failure with hypoxia (HCC) Continue oxygen via nasal cannula.  S/p pacemaker and aortic valve replacement -No acute issues. Patient gets her routine pacemaker checks -Follows with Dr. [name] at [facility]    Benign essential HTN -continue Cartia, carvedilol and hydralazine    Chronic obstructive pulmonary disease ([place]) -DuoNebs as needed    Type 2 diabetes with peripheral circulatory disorder, controlled ([place]) -Sliding scale insulin for now pending med rec    CKD (chronic kidney disease) stage 3b, GFR 30-59 ml/min -Renal function at baseline     Body mass index is 37.07 kg/m. (morbid obesity): This complicates care and prognosis   Family Communication/Anticipated D/C date and plan/Code Status   DVT prophylaxis: Warfarin Code Status: Full code Family Communication: Plan discussed with patient Disposition Plan: Patient is from home.  Plan to discharge her home in 2 to 3 days when shortness of breath improves      Subjective:   "I don't feel good".  She said she feels a little short of breath.  Objective:    Vitals:   10/10/19 0200 10/10/19 0432 10/10/19 0451 10/10/19 0733  BP: (!) 144/64 (!) 167/82  (!) 162/64  Pulse: 65 65  66  Resp: (!) 27 19  18   Temp:  (!) 97.5 F (36.4 C)  (!) 97.5 F (36.4 C)  TempSrc:  Oral  Oral  SpO2: 97% 93%  99%  Weight:   91.9 kg   Height:   5\' 2"  (1.575 m)     Intake/Output Summary (Last 24 hours) at 10/10/2019 0907 Last data filed at 10/10/2019 0735 Gross per 24 hour  Intake --  Output 150 ml  Net -150 ml   Filed Weights   10/09/19 2348 10/10/19 0451  Weight: 94.6 kg 91.9 kg  Exam:  GEN: NAD SKIN: No rash EYES: EOMI ENT: MMM CV: RRR PULM: CTA B ABD: soft, obese, NT, +BS CNS: AAO x 3, non focal EXT: b/l leg edema (2+ ), no tenderness   Data Reviewed:   I have personally reviewed following labs and imaging studies:  Labs: Labs show the following:   Basic Metabolic Panel: Recent Labs  Lab 10/09/19 2348  NA 142  K 3.9  CL 104  CO2 29  GLUCOSE 98  BUN 26*  CREATININE 1.50*  CALCIUM 8.9   GFR Estimated Creatinine Clearance: 27.9 mL/min (A) (by C-G formula based on SCr of 1.5 mg/dL  (H)). Liver Function Tests: Recent Labs  Lab 10/09/19 2348  AST 24  ALT 12  ALKPHOS 72  BILITOT 0.8  PROT 7.5  ALBUMIN 3.5   No results for input(s): LIPASE, AMYLASE in the last 168 hours. No results for input(s): AMMONIA in the last 168 hours. Coagulation profile Recent Labs  Lab 10/09/19 2348  INR 3.0*    CBC: Recent Labs  Lab 10/09/19 2348  WBC 5.5  NEUTROABS 3.4  HGB 10.4*  HCT 35.2*  MCV 80.9  PLT 221   Cardiac Enzymes: No results for input(s): CKTOTAL, CKMB, CKMBINDEX, TROPONINI in the last 168 hours. BNP (last 3 results) No results for input(s): PROBNP in the last 8760 hours. CBG: Recent Labs  Lab 10/10/19 0232 10/10/19 0820  GLUCAP 63* 89   D-Dimer: No results for input(s): DDIMER in the last 72 hours. Hgb A1c: No results for input(s): HGBA1C in the last 72 hours. Lipid Profile: No results for input(s): CHOL, HDL, LDLCALC, TRIG, CHOLHDL, LDLDIRECT in the last 72 hours. Thyroid function studies: Recent Labs    10/09/19 2348  TSH 1.604   Anemia work up: No results for input(s): VITAMINB12, FOLATE, FERRITIN, TIBC, IRON, RETICCTPCT in the last 72 hours. Sepsis Labs: Recent Labs  Lab 10/09/19 2348  WBC 5.5    Microbiology No results found for this or any previous visit (from the past 240 hour(s)).  Procedures and diagnostic studies:  DG Chest Portable 1 View  Result Date: 10/10/2019 CLINICAL DATA:  Dyspnea on exertion EXAM: PORTABLE CHEST 1 VIEW COMPARISON:  September 21, 2019 FINDINGS: There is a moderate to large right-sided pleural effusion. There is a small left-sided pleural effusion. There is a dual chamber pacemaker in place. The heart size is enlarged. Aortic calcifications are noted. The patient is status post prior median sternotomy. There is no pneumothorax. Vascular congestion with developing pulmonary edema is noted. IMPRESSION: Cardiomegaly with developing pulmonary edema and bilateral pleural effusions, right greater than left.  Electronically Signed   By: Constance Holster M.D.   On: 10/10/2019 00:25    Medications:   . aspirin EC  81 mg Oral Daily  . atorvastatin  10 mg Oral Daily  . carvedilol  6.25 mg Oral BID WC  . diltiazem  240 mg Oral Daily  . furosemide  40 mg Intravenous Q12H  . hydrALAZINE  37.5 mg Oral TID  . insulin aspart  0-15 Units Subcutaneous TID WC  . insulin aspart  0-5 Units Subcutaneous QHS  . pantoprazole  40 mg Oral Daily  . sodium chloride flush  3 mL Intravenous Q12H  . sodium chloride flush  3 mL Intravenous Q12H   Continuous Infusions: . sodium chloride       LOS: 0 days   Aleiya Rye  [facility]     10/10/2019, 9:07 AM

## 2019-10-10 NOTE — ED Notes (Signed)
Pts daughter called to confirm that she is called with concerns of care because she is the POA. Pts daughter also stressed that pt is NOT A DNR. Daughter reports they placed a DNR bracelet on her last time and she is not supposed to be treated as such.

## 2019-10-11 ENCOUNTER — Inpatient Hospital Stay: Admit: 2019-10-11 | Payer: Medicare Other

## 2019-10-11 ENCOUNTER — Inpatient Hospital Stay (HOSPITAL_COMMUNITY)
Admit: 2019-10-11 | Discharge: 2019-10-11 | Disposition: A | Discharge status: Home / Self Care | Payer: Medicare Other | Attending: Internal Medicine | Admitting: Internal Medicine

## 2019-10-11 DIAGNOSIS — I5033 Acute on chronic diastolic (congestive) heart failure: Secondary | ICD-10-CM | POA: Diagnosis not present

## 2019-10-11 DIAGNOSIS — I10 Essential (primary) hypertension: Secondary | ICD-10-CM

## 2019-10-11 DIAGNOSIS — N1832 Chronic kidney disease, stage 3b: Secondary | ICD-10-CM | POA: Diagnosis not present

## 2019-10-11 DIAGNOSIS — J9611 Chronic respiratory failure with hypoxia: Secondary | ICD-10-CM | POA: Diagnosis not present

## 2019-10-11 DIAGNOSIS — I5031 Acute diastolic (congestive) heart failure: Secondary | ICD-10-CM

## 2019-10-11 LAB — ECHOCARDIOGRAM COMPLETE
Height: 62 in
Weight: 3161.6 oz

## 2019-10-11 LAB — BASIC METABOLIC PANEL
Anion gap: 15 (ref 5–15)
BUN: 37 mg/dL — ABNORMAL HIGH (ref 8–23)
CO2: 26 mmol/L (ref 22–32)
Calcium: 9 mg/dL (ref 8.9–10.3)
Chloride: 99 mmol/L (ref 98–111)
Creatinine, Ser: 1.7 mg/dL — ABNORMAL HIGH (ref 0.44–1.00)
GFR calc Af Amer: 31 mL/min — ABNORMAL LOW (ref >60)
GFR calc non Af Amer: 27 mL/min — ABNORMAL LOW (ref >60)
Glucose, Bld: 211 mg/dL — ABNORMAL HIGH (ref 70–99)
Potassium: 4.4 mmol/L (ref 3.5–5.1)
Sodium: 140 mmol/L (ref 135–145)

## 2019-10-11 LAB — MAGNESIUM: Magnesium: 2.5 mg/dL — ABNORMAL HIGH (ref 1.7–2.4)

## 2019-10-11 LAB — GLUCOSE, CAPILLARY
Glucose-Capillary: 212 mg/dL — ABNORMAL HIGH (ref 70–99)
Glucose-Capillary: 224 mg/dL — ABNORMAL HIGH (ref 70–99)
Glucose-Capillary: 195 mg/dL — ABNORMAL HIGH (ref 70–99)
Glucose-Capillary: 220 mg/dL — ABNORMAL HIGH (ref 70–99)

## 2019-10-11 LAB — PROTIME-INR
INR: 3.7 — ABNORMAL HIGH (ref 0.8–1.2)
Prothrombin Time: 36.4 seconds — ABNORMAL HIGH (ref 11.4–15.2)

## 2019-10-11 MED ORDER — TRAZODONE HCL 50 MG PO TABS
50.0000 mg | ORAL_TABLET | Freq: Every evening | ORAL | Status: DC | PRN
  Administered 2019-10-11: 50 mg via ORAL
  Filled 2019-10-11: qty 1

## 2019-10-11 NOTE — Progress Notes (Signed)
ANTICOAGULATION CONSULT NOTE - Follow up [facility] [place] for Warfarin (home medication) Indication: atrial fibrillation  No Known Allergies  Patient Measurements: Height: 5\' 2"  (157.5 cm) Weight: 89.6 kg (197 lb 9.6 oz) IBW/kg (Calculated) : 50.1  Vital Signs: Temp: 97.7 F (36.5 C) (04/06 0748) Temp Source: Oral (04/06 0556) BP: 118/92 (04/06 0748) Pulse Rate: 66 (04/06 0748)  Labs: Recent Labs    10/09/19 2348 10/10/19 0231 10/11/19 0856  HGB 10.4*  --   --   HCT 35.2*  --   --   PLT 221  --   --   LABPROT 31.2*  --  36.4*  INR 3.0*  --  3.7*  CREATININE 1.50*  --  1.70*  TROPONINIHS 54* 52*  --     Estimated Creatinine Clearance: 24.3 mL/min (A) (by C-G formula based on SCr of 1.7 mg/dL (H)).   Medical History: Past Medical History:  Diagnosis Date  . Anemia   . CHF (congestive heart failure) ([place])   . Chronic kidney disease   . COPD (chronic obstructive pulmonary disease) ([place])   . Diabetes mellitus without complication ([place])   . Hypertension     Assessment: Pharmacy asked to manage Warfarin per protocol in this 86 yo female on warfarin PTA for AFib.  INR 3.0 on admission. Home warfarin regimen per PTA med list: 5 mg every day except 7.5 mg on Thursdays, last dose 4/4 PTA.   4/4 INR 3.0  Goal of Therapy:  INR 2-3 - INR goal confirmed with hospitalist  Monitor platelets by anticoagulation protocol: Yes   Plan:  INR 3.7, supratherapeutic, possibly due to CHF exacerbation.  Will hold INR dose for tonight. Daily INR and CBC at least every three days per policy  Pharmacy will continue to follow.   Doreather Hoxworth A Shelley Cocke 10/11/2019,9:51 AM

## 2019-10-11 NOTE — Progress Notes (Addendum)
Progress Note    Kimberly Peterson  [id number] DOB: 1930-03-12  DOA: 10/09/2019 PCP: [name], MD      Brief Narrative:    Medical records reviewed and are as summarized below:  Kimberly Peterson is an 86 y.o. female with medical history significant for diastolic CHF, CKD 3, COPD, DM 2, HTN, chronic respiratory failure on home O2 2 to 3 L, chronic A. fib on Coumadin, OSA on CPAP and history of pacemaker and aortic valve replacement, hospitalized from 09/15/2019 to 09/22/2019 with CHF exacerbation who presented  to the emergency room with the of shortness of breath, orthopnea and worsening lower extremity edema.      Assessment/Plan:   Active Problems:   Benign essential HTN   Chronic obstructive pulmonary disease (HCC)   Atrial fibrillation (HCC)   Type 2 diabetes with peripheral circulatory disorder, controlled (HCC)   Acute on chronic diastolic heart failure (HCC)   Elevated troponin   CKD (chronic kidney disease) stage 3, GFR 30-59 ml/min   Chronic respiratory failure with hypoxia (HCC)   H/O aortic valve replacement   Acute on chronic diastolic (congestive) heart failure (HCC)   Acute on chronic diastolic heart failure (HCC)/severe pulmonary hypertension -Patient discharged just 2 weeks prior with heart failure exacerbation now presenting with dyspnea at rest, pedal edema -Hold IV Lasix -Continue Coreg.  Not on ACE/ARB likely secondary to CKD -Daily weights, intake and output monitoring, salt and fluid restriction -Echocardiogram is pending.  AKI -Hold IV Lasix and monitor BMP   Nausea -Antiemetics as needed    Atrial fibrillation (HCC) -Continue Coumadin, continue Coreg -Pharmacy to adjust Coumadin based on INR    Mildly elevated troponin -Troponins are flat and may be related to demand ischemia from CHF exacerbation.    Chronic respiratory failure with hypoxia (HCC) Continue oxygen via nasal cannula.  S/p pacemaker and aortic valve  replacement -No acute issues. Patient gets her routine pacemaker checks -Follows with Dr. [name] at [facility]    Benign essential HTN -continue Cartia, carvedilol and hydralazine    Chronic obstructive pulmonary disease ([place]) -DuoNebs as needed    Type 2 diabetes with peripheral circulatory disorder, controlled ([place]) -Sliding scale insulin for now pending med rec    CKD (chronic kidney disease) stage 3b, GFR 30-59 ml/min -Renal function at baseline    OSA Continue CPAP at night.     Body mass index is 36.14 kg/m. (morbid obesity): This complicates care and prognosis   Family Communication/Anticipated D/C date and plan/Code Status   DVT prophylaxis: Warfarin Code Status: Full code Family Communication: Plan discussed with patient Disposition Plan: Patient is from home.  Plan to discharge her home in 1-2 days if creatinine stabilizes and shortness of breath improves      Subjective:   C/o nausea and "I feel sick".  No abdominal pain, vomiting, shortness of breath or chest pain.  Objective:    Vitals:   10/10/19 2208 10/11/19 0556 10/11/19 0748 10/11/19 1115  BP:  (!) 131/54 (!) 118/92 (!) 126/55  Pulse: 67 65 66 66  Resp:   18 18  Temp:  97.6 F (36.4 C) 97.7 F (36.5 C) 97.9 F (36.6 C)  TempSrc:  Oral    SpO2: 97% 90% 100% 95%  Weight:  89.6 kg    Height:        Intake/Output Summary (Last 24 hours) at 10/11/2019 1515 Last data filed at 10/11/2019 0915 Gross per 24 hour  Intake  120 ml  Output 550 ml  Net -430 ml   Filed Weights   10/09/19 2348 10/10/19 0451 10/11/19 0556  Weight: 94.6 kg 91.9 kg 89.6 kg    Exam:  GEN: NAD SKIN: No rash EYES: EOMI ENT: MMM CV: RRR PULM: No wheezing or rales heard  ABD: soft, obese, NT, +BS CNS: AAO x 3, non focal EXT: b/l leg edema (2+ ), no tenderness   Data Reviewed:   I have personally reviewed following labs and imaging studies:  Labs: Labs show the following:   Basic Metabolic  Panel: Recent Labs  Lab 10/09/19 2348 10/11/19 0856  NA 142 140  K 3.9 4.4  CL 104 99  CO2 29 26  GLUCOSE 98 211*  BUN 26* 37*  CREATININE 1.50* 1.70*  CALCIUM 8.9 9.0  MG  --  2.5*   GFR Estimated Creatinine Clearance: 24.3 mL/min (A) (by C-G formula based on SCr of 1.7 mg/dL (H)). Liver Function Tests: Recent Labs  Lab 10/09/19 2348  AST 24  ALT 12  ALKPHOS 72  BILITOT 0.8  PROT 7.5  ALBUMIN 3.5   No results for input(s): LIPASE, AMYLASE in the last 168 hours. No results for input(s): AMMONIA in the last 168 hours. Coagulation profile Recent Labs  Lab 10/09/19 2348 10/11/19 0856  INR 3.0* 3.7*    CBC: Recent Labs  Lab 10/09/19 2348  WBC 5.5  NEUTROABS 3.4  HGB 10.4*  HCT 35.2*  MCV 80.9  PLT 221   Cardiac Enzymes: No results for input(s): CKTOTAL, CKMB, CKMBINDEX, TROPONINI in the last 168 hours. BNP (last 3 results) No results for input(s): PROBNP in the last 8760 hours. CBG: Recent Labs  Lab 10/10/19 1145 10/10/19 1625 10/10/19 2111 10/11/19 0748 10/11/19 1158  GLUCAP 80 85 128* 212* 224*   D-Dimer: No results for input(s): DDIMER in the last 72 hours. Hgb A1c: No results for input(s): HGBA1C in the last 72 hours. Lipid Profile: No results for input(s): CHOL, HDL, LDLCALC, TRIG, CHOLHDL, LDLDIRECT in the last 72 hours. Thyroid function studies: Recent Labs    10/09/19 2348  TSH 1.604   Anemia work up: No results for input(s): VITAMINB12, FOLATE, FERRITIN, TIBC, IRON, RETICCTPCT in the last 72 hours. Sepsis Labs: Recent Labs  Lab 10/09/19 2348  WBC 5.5    Microbiology Recent Results (from the past 240 hour(s))  SARS CORONAVIRUS 2 (TAT 6-24 HRS) Nasopharyngeal Nasopharyngeal Swab     Status: None   Collection Time: 10/09/19 11:48 PM   Specimen: Nasopharyngeal Swab  Result Value Ref Range Status   SARS Coronavirus 2 NEGATIVE NEGATIVE Final    Comment: (NOTE) SARS-CoV-2 target nucleic acids are NOT DETECTED. The SARS-CoV-2  RNA is generally detectable in upper and lower respiratory specimens during the acute phase of infection. Negative results do not preclude SARS-CoV-2 infection, do not rule out co-infections with other pathogens, and should not be used as the sole basis for treatment or other patient management decisions. Negative results must be combined with clinical observations, patient history, and epidemiological information. The expected result is Negative. Fact Sheet for Patients: SugarRoll.be Fact Sheet for Healthcare Providers: https://www.woods-mathews.com/ This test is not yet approved or cleared by the [place] FDA and  has been authorized for detection and/or diagnosis of SARS-CoV-2 by FDA under an Emergency Use Authorization (EUA). This EUA will remain  in effect (meaning this test can be used) for the duration of the COVID-19 declaration under Section 56 4(b)(1) of the Act, 21 U.S.C.  section 360bbb-3(b)(1), unless the authorization is terminated or revoked sooner. Performed at [facility], [place] [address], [place], [place] [address]     Procedures and diagnostic studies:  DG Chest Portable 1 View  Result Date: 10/10/2019 CLINICAL DATA:  Dyspnea on exertion EXAM: PORTABLE CHEST 1 VIEW COMPARISON:  September 21, 2019 FINDINGS: There is a moderate to large right-sided pleural effusion. There is a small left-sided pleural effusion. There is a dual chamber pacemaker in place. The heart size is enlarged. Aortic calcifications are noted. The patient is status post prior median sternotomy. There is no pneumothorax. Vascular congestion with developing pulmonary edema is noted. IMPRESSION: Cardiomegaly with developing pulmonary edema and bilateral pleural effusions, right greater than left. Electronically Signed   By: Constance Holster M.D.   On: 10/10/2019 00:25    Medications:   . aspirin EC  81 mg Oral Daily  . atorvastatin  10 mg Oral Daily   . carvedilol  6.25 mg Oral BID WC  . diltiazem  240 mg Oral Daily  . hydrALAZINE  37.5 mg Oral TID  . insulin aspart  0-15 Units Subcutaneous TID WC  . insulin aspart  0-5 Units Subcutaneous QHS  . pantoprazole  40 mg Oral Daily  . sodium chloride flush  3 mL Intravenous Q12H  . sodium chloride flush  3 mL Intravenous Q12H  . Warfarin - Pharmacist Dosing Inpatient   Does not apply q1600   Continuous Infusions: . sodium chloride       LOS: 1 day   Zyana Amaro  [facility]     10/11/2019, 3:15 PM

## 2019-10-11 NOTE — Progress Notes (Signed)
*  PRELIMINARY RESULTS* Echocardiogram 2D Echocardiogram has been performed.  Kimberly Peterson Kimberly Peterson 10/11/2019, 2:17 PM

## 2019-10-12 DIAGNOSIS — I5033 Acute on chronic diastolic (congestive) heart failure: Secondary | ICD-10-CM | POA: Diagnosis not present

## 2019-10-12 DIAGNOSIS — J9611 Chronic respiratory failure with hypoxia: Secondary | ICD-10-CM | POA: Diagnosis not present

## 2019-10-12 DIAGNOSIS — I4811 Longstanding persistent atrial fibrillation: Secondary | ICD-10-CM | POA: Diagnosis not present

## 2019-10-12 DIAGNOSIS — I10 Essential (primary) hypertension: Secondary | ICD-10-CM | POA: Diagnosis not present

## 2019-10-12 LAB — BASIC METABOLIC PANEL
Anion gap: 11 (ref 5–15)
BUN: 55 mg/dL — ABNORMAL HIGH (ref 8–23)
CO2: 27 mmol/L (ref 22–32)
Calcium: 8.8 mg/dL — ABNORMAL LOW (ref 8.9–10.3)
Chloride: 96 mmol/L — ABNORMAL LOW (ref 98–111)
Creatinine, Ser: 2.6 mg/dL — ABNORMAL HIGH (ref 0.44–1.00)
GFR calc Af Amer: 18 mL/min — ABNORMAL LOW (ref >60)
GFR calc non Af Amer: 16 mL/min — ABNORMAL LOW (ref >60)
Glucose, Bld: 246 mg/dL — ABNORMAL HIGH (ref 70–99)
Potassium: 4.8 mmol/L (ref 3.5–5.1)
Sodium: 134 mmol/L — ABNORMAL LOW (ref 135–145)

## 2019-10-12 LAB — PROTIME-INR
INR: 5 (ref 0.8–1.2)
INR: 4.9 (ref 0.8–1.2)
Prothrombin Time: 46.6 seconds — ABNORMAL HIGH (ref 11.4–15.2)
Prothrombin Time: 45.4 seconds — ABNORMAL HIGH (ref 11.4–15.2)

## 2019-10-12 LAB — GLUCOSE, CAPILLARY
Glucose-Capillary: 230 mg/dL — ABNORMAL HIGH (ref 70–99)
Glucose-Capillary: 221 mg/dL — ABNORMAL HIGH (ref 70–99)
Glucose-Capillary: 212 mg/dL — ABNORMAL HIGH (ref 70–99)
Glucose-Capillary: 180 mg/dL — ABNORMAL HIGH (ref 70–99)

## 2019-10-12 LAB — MAGNESIUM: Magnesium: 2.8 mg/dL — ABNORMAL HIGH (ref 1.7–2.4)

## 2019-10-12 MED ORDER — PHYTONADIONE 5 MG PO TABS
2.5000 mg | ORAL_TABLET | Freq: Once | ORAL | Status: AC
  Administered 2019-10-12: 2.5 mg via ORAL
  Filled 2019-10-12: qty 1

## 2019-10-12 NOTE — Progress Notes (Signed)
Critical PT/INR reported via text page to on call provider. Acknowledged.

## 2019-10-12 NOTE — Progress Notes (Signed)
PROGRESS NOTE    Kimberly Peterson   [id number]  DOB: 11/12/1929  PCP: [name], MD    DOA: 10/09/2019 LOS: 2    Brief Narrative   Kimberly Peterson is an 86 y.o. femalewith medical history significant fordiastolic CHF, CKD 3a, COPD, DM 2, HTN, chronic respiratory failure on 2 L/min home oxygen, chronic A. fib on Coumadin, OSA on CPAP and history of pacemaker and aortic valve replacement, hospitalized from 09/15/2019 to 09/22/2019 with CHF exacerbation who presented  to the ED on the night of 10/09/19  with the of shortness of breath, orthopnea and worsening lower extremity edema.  Admitted for management of acute on chronic diastolic CHF.     Assessment & Plan   Active Problems:   Benign essential HTN   Chronic obstructive pulmonary disease (HCC)   Atrial fibrillation (HCC)   Type 2 diabetes with peripheral circulatory disorder, controlled (HCC)   Acute on chronic diastolic heart failure (HCC)   Elevated troponin   CKD (chronic kidney disease) stage 3, GFR 30-59 ml/min   Chronic respiratory failure with hypoxia (HCC)   H/O aortic valve replacement   Acute on chronic diastolic (congestive) heart failure (HCC)  Acute on chronic diastolic heart failure Severe pulmonary hypertension Patient with recurrent admissions for decompensated CHF.  Had just been discharged 2 weeks prior for same.  Again presented with dyspnea at rest, pedal edema.  Echocardiogram on 10/11/2019 showed EF greater than 55%, indeterminate diastolic function.   --Get repeat ReDS clip reading in prep for discharge -Hold IV Lasix due to AKI -Continue Coreg.  --Not on ACE/ARB likely secondary to CKD -Daily weights, intake and output monitoring, salt and fluid restriction --Follow-up with cardiology within 1 week of discharge (Dr. [name])  AKI superimposed on CKD stage IIIa -baseline renal function appears to be creatinine 1.1-1.3.  Creatinine up to 2.60 today, from 1.5 on admission, secondary to  diuresis -Hold IV Lasix and monitor BMP  Nausea -resolved -Antiemetics as needed  Supratherapeutic INR -INR 5.0 today.  Pharmacy to hold warfarin today.   --Recheck INR tomorrow  Atrial fibrillation (HCC) -Continue Coumadin, continue Coreg -Pharmacy to adjust Coumadin based on INR -   Mildly elevated troponin -Troponins flat and likely related to demand ischemia from CHF exacerbation.  Chronic respiratory failure with hypoxia (HCC) Continue oxygen via nasal cannula.  Maintain O2 sat greater than 90%.  S/p pacemaker and aortic valve replacement -No acute issues. Patient gets her routine pacemaker checks -Follows with Dr. [name] at [facility]  Benign essential HTN -continue Cartia, carvedilol and hydralazine  Chronic obstructive pulmonary disease ([place]) -DuoNebs as needed  Type 2 diabetes with peripheral circulatory disorder, controlled ([place]) -Sliding scale insulin for now pending med rec  OSA Continue CPAP at night.   Morbid obesity: Body mass index is 36.14 kg/m.  Contributes to shortness of breath, overall complicating factor.  DVT prophylaxis: on warfarin Diet:  Diet Orders (From admission, onward)    Start     Ordered   10/10/19 0132  Diet heart healthy/carb modified Room service appropriate? Yes; Fluid consistency: Thin  Diet effective now    Question Answer Comment  Diet-HS Snack? Nothing   Room service appropriate? Yes   Fluid consistency: Thin      10/10/19 0136            Code Status: Full Code    Subjective 10/12/19    Patient seen up in chair this morning.  No acute events reported overnight.  She reports feeling much better.  Denies shortness of breath, chest pain or other complaints.  Confirms she does use 2 L/min oxygen at home.   Disposition Plan & Communication   Dispo & Barriers: Anticipate discharge home pending improvement in renal function which worsened with aggressive diuresis. Coming from: Home Exp d/c date:  4/8-9 Medically stable for d/c?  No  Family Communication: None at bedside during encounter, will attempt to call   Consults, Procedures, Significant Events   Consultants:   Pharmacy, warfarin dosing  Procedures:   None  Antimicrobials:   None   Objective   Vitals:   10/11/19 1547 10/11/19 1946 10/12/19 0630 10/12/19 0735  BP: (!) 119/59 (!) 106/46 (!) 96/48 (!) 113/48  Pulse: 65 66 65 65  Resp: 18 14 14 18   Temp: 98.3 F (36.8 C) (!) 97.4 F (36.3 C) 97.6 F (36.4 C) (!) 97.4 F (36.3 C)  TempSrc:  Oral Oral Oral  SpO2: 99% 100% 100% 100%  Weight:      Height:        Intake/Output Summary (Last 24 hours) at 10/12/2019 0816 Last data filed at 10/12/2019 0540 Gross per 24 hour  Intake 360 ml  Output 0 ml  Net 360 ml   Filed Weights   10/09/19 2348 10/10/19 0451 10/11/19 0556  Weight: 94.6 kg 91.9 kg 89.6 kg    Physical Exam:  General exam: awake, alert, no acute distress, obese HEENT: moist mucus membranes, hearing grossly normal  Respiratory system: CTAB, good aeration, no wheezes, rales or rhonchi, normal respiratory effort, on 2 L/min nasal cannula oxygen. Cardiovascular system: normal [id number], RRR, 2/6 systolic murmur, right greater than left lower extremity pitting edema with some skin wrinkling suggestive of improvement.   Central nervous system: A&O x3. no gross focal neurologic deficits, normal speech Extremities: moves all, no edema, normal tone Psychiatry: normal mood, congruent affect, judgement and insight appear normal  Labs   Data Reviewed: I have personally reviewed following labs and imaging studies  CBC: Recent Labs  Lab 10/09/19 2348  WBC 5.5  NEUTROABS 3.4  HGB 10.4*  HCT 35.2*  MCV 80.9  PLT [id number]   Basic Metabolic Panel: Recent Labs  Lab 10/09/19 2348 10/11/19 0856 10/12/19 0543  NA 142 140 134*  K 3.9 4.4 4.8  CL 104 99 96*  CO2 29 26 27   GLUCOSE 98 211* 246*  BUN 26* 37* 55*  CREATININE 1.50* 1.70* 2.60*  CALCIUM  8.9 9.0 8.8*  MG  --  2.5* 2.8*   GFR: Estimated Creatinine Clearance: 15.9 mL/min (A) (by C-G formula based on SCr of 2.6 mg/dL (H)). Liver Function Tests: Recent Labs  Lab 10/09/19 2348  AST 24  ALT 12  ALKPHOS 72  BILITOT 0.8  PROT 7.5  ALBUMIN 3.5   No results for input(s): LIPASE, AMYLASE in the last 168 hours. No results for input(s): AMMONIA in the last 168 hours. Coagulation Profile: Recent Labs  Lab 10/09/19 2348 10/11/19 0856 10/12/19 0543  INR 3.0* 3.7* 5.0*   Cardiac Enzymes: No results for input(s): CKTOTAL, CKMB, CKMBINDEX, TROPONINI in the last 168 hours. BNP (last 3 results) No results for input(s): PROBNP in the last 8760 hours. HbA1C: No results for input(s): HGBA1C in the last 72 hours. CBG: Recent Labs  Lab 10/11/19 0748 10/11/19 1158 10/11/19 1644 10/11/19 2133 10/12/19 0801  GLUCAP 212* 224* 195* 220* 230*   Lipid Profile: No results for input(s): CHOL, HDL, LDLCALC, TRIG, CHOLHDL, LDLDIRECT in the last  72 hours. Thyroid Function Tests: Recent Labs    10/09/19 2348  TSH 1.604   Anemia Panel: No results for input(s): VITAMINB12, FOLATE, FERRITIN, TIBC, IRON, RETICCTPCT in the last 72 hours. Sepsis Labs: No results for input(s): PROCALCITON, LATICACIDVEN in the last 168 hours.  Recent Results (from the past 240 hour(s))  SARS CORONAVIRUS 2 (TAT 6-24 HRS) Nasopharyngeal Nasopharyngeal Swab     Status: None   Collection Time: 10/09/19 11:48 PM   Specimen: Nasopharyngeal Swab  Result Value Ref Range Status   SARS Coronavirus 2 NEGATIVE NEGATIVE Final    Comment: (NOTE) SARS-CoV-2 target nucleic acids are NOT DETECTED. The SARS-CoV-2 RNA is generally detectable in upper and lower respiratory specimens during the acute phase of infection. Negative results do not preclude SARS-CoV-2 infection, do not rule out co-infections with other pathogens, and should not be used as the sole basis for treatment or other patient management  decisions. Negative results must be combined with clinical observations, patient history, and epidemiological information. The expected result is Negative. Fact Sheet for Patients: SugarRoll.be Fact Sheet for Healthcare Providers: https://www.woods-mathews.com/ This test is not yet approved or cleared by the [place] FDA and  has been authorized for detection and/or diagnosis of SARS-CoV-2 by FDA under an Emergency Use Authorization (EUA). This EUA will remain  in effect (meaning this test can be used) for the duration of the COVID-19 declaration under Section 56 4(b)(1) of the Act, 21 U.S.C. section 360bbb-3(b)(1), unless the authorization is terminated or revoked sooner. Performed at [facility], [place] [address], [place], [place] [address]       Imaging Studies   ECHOCARDIOGRAM COMPLETE  Result Date: 10/11/2019    ECHOCARDIOGRAM REPORT   Patient Name:   Kimberly Peterson Date of Exam: 10/11/2019 Medical Rec #:  [id number]       Height:       62.0 in Accession #:    [id number]      Weight:       197.6 lb Date of Birth:  Nov 29, 1929        BSA:          1.902 m Patient Age:    73 years        BP:           126/55 mmHg Patient Gender: F               HR:           65 bpm. Exam Location:  [facility] Procedure: 2D Echo, Color Doppler and Cardiac Doppler Indications:     I50.31 CHF-Acute Diastolic  History:         Patient has prior history of Echocardiogram examinations, most                  recent 07/12/2019. CHF, COPD and CKD; Risk Factors:Hypertension                  and Diabetes.  Sonographer:     Charmayne Sheer RDCS (AE) Referring Phys:  [id number] Athena Masse Diagnosing Phys: Nelva Bush MD  Sonographer Comments: Technically difficult study due to poor echo windows. Image acquisition challenging due to patient body habitus, performed w/ pt sitting in chair, Image acquisition challenging due to respiratory motion and Image acquisition challenging due to  COPD. IMPRESSIONS  1. Left ventricular ejection fraction, by estimation, is >55%. The left ventricle has normal function. Left ventricular endocardial border not optimally defined to evaluate regional wall motion.  There is mild left ventricular hypertrophy. Left ventricular diastolic parameters are indeterminate.  2. Right ventricular systolic function was not well visualized. The right ventricular size is not well visualized.  3. The mitral valve is abnormal. Mild mitral valve regurgitation. Mild to moderate mitral stenosis.  4. The aortic valve was not well visualized. Aortic valve regurgitation is not visualized. No aortic stenosis is present. FINDINGS  Left Ventricle: Left ventricular ejection fraction, by estimation, is >55%. The left ventricle has normal function. Left ventricular endocardial border not optimally defined to evaluate regional wall motion. The left ventricular internal cavity size was  normal in size. There is mild left ventricular hypertrophy. Left ventricular diastolic parameters are indeterminate. Right Ventricle: The right ventricular size is not well visualized. Right vetricular wall thickness was not assessed. Right ventricular systolic function was not well visualized. Left Atrium: Left atrial size was normal in size. Right Atrium: Right atrial size was not well visualized. Pericardium: The pericardium was not well visualized. Mitral Valve: The mitral valve is abnormal. There is severe thickening of the mitral valve leaflet(s). Severe mitral annular calcification. Mild mitral valve regurgitation. Mild to moderate mitral valve stenosis. MV peak gradient, 12.1 mmHg. The mean mitral valve gradient is 5.0 mmHg. Tricuspid Valve: The tricuspid valve is not well visualized. Tricuspid valve regurgitation is not demonstrated. Aortic Valve: The aortic valve was not well visualized. Aortic valve regurgitation is not visualized. No aortic stenosis is present. Aortic valve mean gradient measures 6.0  mmHg. Aortic valve peak gradient measures 12.1 mmHg. Aortic valve area, by VTI measures 1.94 cm. Pulmonic Valve: The pulmonic valve was not well visualized. Pulmonic valve regurgitation is not visualized. No evidence of pulmonic stenosis. Aorta: The aortic root is normal in size and structure. Pulmonary Artery: The pulmonary artery is not well seen. Venous: The inferior vena cava was not well visualized. IAS/Shunts: The interatrial septum was not well visualized.  LEFT VENTRICLE PLAX 2D LVIDd:         4.07 cm LVIDs:         3.75 cm LV PW:         1.28 cm LV IVS:        0.99 cm LVOT diam:     2.30 cm LV SV:         64 LV SV Index:   34 LVOT Area:     4.15 cm  LEFT ATRIUM           Index LA diam:      5.90 cm 3.10 cm/m LA Vol (A2C): 50.8 ml 26.71 ml/m  AORTIC VALVE                    PULMONIC VALVE AV Area (Vmax):    1.68 cm     PV Vmax:       0.93 m/s AV Area (Vmean):   2.02 cm     PV Vmean:      57.900 cm/s AV Area (VTI):     1.94 cm     PV VTI:        0.199 m AV Vmax:           174.00 cm/s  PV Peak grad:  3.5 mmHg AV Vmean:          110.000 cm/s PV Mean grad:  2.0 mmHg AV VTI:            0.330 m AV Peak Grad:      12.1 mmHg AV Mean Grad:  6.0 mmHg LVOT Vmax:         70.30 cm/s LVOT Vmean:        53.600 cm/s LVOT VTI:          0.154 m LVOT/AV VTI ratio: 0.47  AORTA Ao Root diam: 2.70 cm MITRAL VALVE MV Area (PHT): 3.48 cm     SHUNTS MV Peak grad:  12.1 mmHg    Systemic VTI:  0.15 m MV Mean grad:  5.0 mmHg     Systemic Diam: 2.30 cm MV Vmax:       1.74 m/s MV Vmean:      98.2 cm/s MV Decel Time: 218 msec MV E velocity: 187.00 cm/s MV A velocity: 69.00 cm/s MV E/A ratio:  2.71 Harrell Gave End MD Electronically signed by Nelva Bush MD Signature Date/Time: 10/11/2019/6:15:42 PM    Final      Medications   Scheduled Meds: . aspirin EC  81 mg Oral Daily  . atorvastatin  10 mg Oral Daily  . carvedilol  6.25 mg Oral BID WC  . diltiazem  240 mg Oral Daily  . hydrALAZINE  37.5 mg Oral TID  . insulin  aspart  0-15 Units Subcutaneous TID WC  . insulin aspart  0-5 Units Subcutaneous QHS  . pantoprazole  40 mg Oral Daily  . sodium chloride flush  3 mL Intravenous Q12H  . sodium chloride flush  3 mL Intravenous Q12H  . Warfarin - Pharmacist Dosing Inpatient   Does not apply q1600   Continuous Infusions: . sodium chloride         LOS: 2 days    Time spent: 35 minutes    Ezekiel Slocumb, DO [facility]   If 7PM-7AM, please contact night-coverage [email or web address] 10/12/2019, 8:16 AM

## 2019-10-12 NOTE — Progress Notes (Signed)
ANTICOAGULATION CONSULT NOTE - Follow up [place] for Warfarin (home medication) Indication: atrial fibrillation  No Known Allergies  Patient Measurements: Height: 5\' 2"  (157.5 cm) Weight: 91.7 kg (202 lb 1.6 oz) IBW/kg (Calculated) : 50.1  Vital Signs: Temp: 97.7 F (36.5 C) (04/07 1149) Temp Source: Oral (04/07 0735) BP: 108/73 (04/07 1149) Pulse Rate: 64 (04/07 1149)  Labs: Recent Labs    10/09/19 2348 10/09/19 2348 10/10/19 0231 10/11/19 0856 10/12/19 0543 10/12/19 1005  HGB 10.4*  --   --   --   --   --   HCT 35.2*  --   --   --   --   --   PLT 221  --   --   --   --   --   LABPROT 31.2*   < >  --  36.4* 46.6* 45.4*  INR 3.0*   < >  --  3.7* 5.0* 4.9*  CREATININE 1.50*  --   --  1.70* 2.60*  --   TROPONINIHS 54*  --  52*  --   --   --    < > = values in this interval not displayed.    Estimated Creatinine Clearance: 16.1 mL/min (A) (by C-G formula based on SCr of 2.6 mg/dL (H)).   Medical History: Past Medical History:  Diagnosis Date  . Anemia   . CHF (congestive heart failure) ([place])   . Chronic kidney disease   . COPD (chronic obstructive pulmonary disease) ([place])   . Diabetes mellitus without complication ([place])   . Hypertension     Assessment: Pharmacy asked to manage Warfarin per protocol in this 86 yo female on warfarin PTA for AFib.  INR 3.0 on admission. Home warfarin regimen per PTA med list: 5 mg every day except 7.5 mg on Thursdays, last dose 4/4 PTA.   4/4 INR 3.0 Warfarin 2mg  4/6 INR 3.7 holding dose   Goal of Therapy:  INR 2-3 - INR goal confirmed with hospitalist  Monitor platelets by anticoagulation protocol: Yes   Plan:  4/7 INR>5.0, repeat INR 4.9, supratherapeutic, likely due to CHF exacerbation. Spoke to nurse, patient has not signs or symptoms of bleeding/bruising. After discussion with hospitalist, will order Vitamin K 2.5mg  PO x 1 dose. Will hold warfarin dose for tonight again, as well. Daily INR and CBC at  least every three days per policy  Pharmacy will continue to follow.   Morgin Halls A Stella Bortle 10/12/2019,1:10 PM

## 2019-10-13 ENCOUNTER — Inpatient Hospital Stay: Payer: Medicare Other

## 2019-10-13 DIAGNOSIS — I10 Essential (primary) hypertension: Secondary | ICD-10-CM | POA: Diagnosis not present

## 2019-10-13 DIAGNOSIS — N1831 Chronic kidney disease, stage 3a: Secondary | ICD-10-CM | POA: Diagnosis not present

## 2019-10-13 DIAGNOSIS — J9611 Chronic respiratory failure with hypoxia: Secondary | ICD-10-CM | POA: Diagnosis not present

## 2019-10-13 DIAGNOSIS — I5033 Acute on chronic diastolic (congestive) heart failure: Secondary | ICD-10-CM | POA: Diagnosis not present

## 2019-10-13 LAB — CBC
HCT: 34.2 % — ABNORMAL LOW (ref 36.0–46.0)
Hemoglobin: 10 g/dL — ABNORMAL LOW (ref 12.0–15.0)
MCH: 24.3 pg — ABNORMAL LOW (ref 26.0–34.0)
MCHC: 29.2 g/dL — ABNORMAL LOW (ref 30.0–36.0)
MCV: 83.2 fL (ref 80.0–100.0)
Platelets: 188 10*3/uL (ref 150–400)
RBC: 4.11 MIL/uL (ref 3.87–5.11)
RDW: 18.7 % — ABNORMAL HIGH (ref 11.5–15.5)
WBC: 6 10*3/uL (ref 4.0–10.5)
nRBC: 0.5 % — ABNORMAL HIGH (ref 0.0–0.2)

## 2019-10-13 LAB — GLUCOSE, CAPILLARY
Glucose-Capillary: 182 mg/dL — ABNORMAL HIGH (ref 70–99)
Glucose-Capillary: 171 mg/dL — ABNORMAL HIGH (ref 70–99)
Glucose-Capillary: 147 mg/dL — ABNORMAL HIGH (ref 70–99)
Glucose-Capillary: 147 mg/dL — ABNORMAL HIGH (ref 70–99)

## 2019-10-13 LAB — PROTIME-INR
INR: 6.1 (ref 0.8–1.2)
Prothrombin Time: 54.1 seconds — ABNORMAL HIGH (ref 11.4–15.2)

## 2019-10-13 LAB — BASIC METABOLIC PANEL
Anion gap: 12 (ref 5–15)
BUN: 65 mg/dL — ABNORMAL HIGH (ref 8–23)
CO2: 29 mmol/L (ref 22–32)
Calcium: 8.7 mg/dL — ABNORMAL LOW (ref 8.9–10.3)
Chloride: 96 mmol/L — ABNORMAL LOW (ref 98–111)
Creatinine, Ser: 3.48 mg/dL — ABNORMAL HIGH (ref 0.44–1.00)
GFR calc Af Amer: 13 mL/min — ABNORMAL LOW (ref >60)
GFR calc non Af Amer: 11 mL/min — ABNORMAL LOW (ref >60)
Glucose, Bld: 196 mg/dL — ABNORMAL HIGH (ref 70–99)
Potassium: 5.2 mmol/L — ABNORMAL HIGH (ref 3.5–5.1)
Sodium: 137 mmol/L (ref 135–145)

## 2019-10-13 MED ORDER — PHYTONADIONE 5 MG PO TABS
5.0000 mg | ORAL_TABLET | Freq: Once | ORAL | Status: AC
  Administered 2019-10-13: 5 mg via ORAL
  Filled 2019-10-13: qty 1

## 2019-10-13 MED ORDER — NEPRO/CARBSTEADY PO LIQD
237.0000 mL | Freq: Two times a day (BID) | ORAL | Status: DC
  Administered 2019-10-14 – 2019-10-17 (×6): 237 mL via ORAL

## 2019-10-13 NOTE — Progress Notes (Signed)
PROGRESS NOTE    Kimberly Peterson   [id number]  DOB: 11-07-1929  PCP: [name], MD    DOA: 10/09/2019 LOS: 3    Brief Narrative   Kimberly Peterson is an 86 y.o. femalewith medical history significant fordiastolic CHF, CKD 3a, COPD, DM 2, HTN, chronic respiratory failure on 2 L/min home oxygen, chronic A. fib on Coumadin, OSA on CPAP and history of pacemaker and aortic valve replacement, hospitalized from 09/15/2019 to 09/22/2019 with CHF exacerbation who presented  to the ED on the night of 10/09/19  with the of shortness of breath, orthopnea and worsening lower extremity edema.  Admitted for management of acute on chronic diastolic CHF.     Assessment & Plan   Active Problems:   Benign essential HTN   Chronic obstructive pulmonary disease (HCC)   Atrial fibrillation (HCC)   Type 2 diabetes with peripheral circulatory disorder, controlled (HCC)   Acute on chronic diastolic heart failure (HCC)   Elevated troponin   CKD (chronic kidney disease) stage 3, GFR 30-59 ml/min   Chronic respiratory failure with hypoxia (HCC)   H/O aortic valve replacement   Acute on chronic diastolic (congestive) heart failure (HCC)  Acute on chronic diastolic heart failure Severe pulmonary hypertension Patient with recurrent admissions for decompensated CHF.  Had just been discharged 2 weeks prior for same.  Again presented with dyspnea at rest, pedal edema.  Echocardiogram on 10/11/2019 showed EF greater than 55%, indeterminate diastolic function.  ReDS clip reading on 4/7 was 40, but patient has mod/large R pleural effusion and small one on left. -Hold IV Lasix due to AKI -Continue Coreg.  --Not on ACE/ARB likely secondary to CKD -Daily weights, intake and output monitoring, salt and fluid restriction --Follow-up with cardiology within 1 week of discharge (Dr. [name])  AKI superimposed on CKD stage IIIa -baseline renal function appears to be creatinine 1.1-1.3.  Creatinine up again to 3.48  (from 2.60 yesterday, from 1.5 on admission), likely secondary to diuresis.  Worsened creatinine despite holding diuretics.  ?Cardiorenal. -Hold IV Lasix and monitor BMP --nephrology consulted for worsening renal function  Nausea -resolved -Antiemetics as needed  Supratherapeutic INR -INR increased from 5.0 to 6.1 today.  Patient got PO vitamin K yesterday.  --pharmacy following --repeat PO vitamin K today, hold warfarin --recheck INR tomorrow  Atrial fibrillation ([place]) -Continue Coumadin when INR comes down --Continue Coreg -Pharmacy to adjust Coumadin based on INR   Mildly elevated troponin -Troponins flat and likely related to demand ischemia from CHF exacerbation.  Chronic respiratory failure with hypoxia (HCC) Continue oxygen via nasal cannula.  Maintain O2 sat greater than 90%.  S/p pacemaker and aortic valve replacement -No acute issues. Patient gets her routine pacemaker checks -Follows with Dr. [name] at [facility]  Benign essential HTN -continue Cartia, carvedilol and hydralazine  Chronic obstructive pulmonary disease ([place]) -DuoNebs as needed  Type 2 diabetes with peripheral circulatory disorder, controlled ([place]) -Sliding scale insulin for now pending med rec  OSA Continue CPAP at night.   Morbid obesity: Body mass index is 36.96 kg/m.  Contributes to shortness of breath, overall complicating factor.  DVT prophylaxis: on warfarin Diet:  Diet Orders (From admission, onward)    Start     Ordered   10/10/19 0132  Diet heart healthy/carb modified Room service appropriate? Yes; Fluid consistency: Thin  Diet effective now    Question Answer Comment  Diet-HS Snack? Nothing   Room service appropriate? Yes   Fluid consistency: Thin  10/10/19 0136            Code Status: Full Code    Subjective 10/13/19    Patient seen up in chair this morning.  No acute events reported overnight.  She reports feeling well.  Denies shortness of  breath, chest pain or other complaints.  We discussed the reason she has not been discharged yet is due to elevated INR, she expressed understanding   Disposition Plan & Communication   Dispo & Barriers: Anticipate discharge home pending improvement in renal function which worsened with aggressive diuresis and normalization of elevated INR. Coming from: Home Exp d/c date: 4/8-9 Medically stable for d/c?  No  Family Communication: None at bedside during encounter, will attempt to call   Consults, Procedures, Significant Events   Consultants:   Pharmacy, warfarin dosing  Procedures:   None  Antimicrobials:   None   Objective   Vitals:   10/12/19 2334 10/13/19 0600 10/13/19 0603 10/13/19 0736  BP:  (!) 129/114  (!) 113/58  Pulse: 64  64 68  Resp:  14  18  Temp:  (!) 97.4 F (36.3 C)  98.2 F (36.8 C)  TempSrc:  Oral    SpO2: 93%  100% 100%  Weight:      Height:        Intake/Output Summary (Last 24 hours) at 10/13/2019 0749 Last data filed at 10/13/2019 0512 Gross per 24 hour  Intake 3 ml  Output 50 ml  Net -47 ml   Filed Weights   10/10/19 0451 10/11/19 0556 10/12/19 1246  Weight: 91.9 kg 89.6 kg 91.7 kg    Physical Exam:  General exam: awake, alert, no acute distress, obese HEENT: moist mucus membranes, hearing grossly normal  Respiratory system: CTAB, good aeration, no wheezes, rales or rhonchi, normal respiratory effort, on 2 L/min nasal cannula oxygen. Cardiovascular system: normal [id number], RRR, 2/6 systolic murmur, right greater than left lower extremity pitting edema slightly worse since yesterday.   Central nervous system: A&O x3. no gross focal neurologic deficits, normal speech Extremities: moves all, no edema, normal tone Psychiatry: normal mood, congruent affect, judgement and insight appear normal  Labs   Data Reviewed: I have personally reviewed following labs and imaging studies  CBC: Recent Labs  Lab 10/09/19 2348  WBC 5.5  NEUTROABS  3.4  HGB 10.4*  HCT 35.2*  MCV 80.9  PLT [id number]   Basic Metabolic Panel: Recent Labs  Lab 10/09/19 2348 10/11/19 0856 10/12/19 0543 10/13/19 0538  NA 142 140 134* 137  K 3.9 4.4 4.8 5.2*  CL 104 99 96* 96*  CO2 29 26 27 29   GLUCOSE 98 211* 246* 196*  BUN 26* 37* 55* 65*  CREATININE 1.50* 1.70* 2.60* 3.48*  CALCIUM 8.9 9.0 8.8* 8.7*  MG  --  2.5* 2.8*  --    GFR: Estimated Creatinine Clearance: 11.8 mL/min (A) (by C-G formula based on SCr of 3.48 mg/dL (H)). Liver Function Tests: Recent Labs  Lab 10/09/19 2348  AST 24  ALT 12  ALKPHOS 72  BILITOT 0.8  PROT 7.5  ALBUMIN 3.5   No results for input(s): LIPASE, AMYLASE in the last 168 hours. No results for input(s): AMMONIA in the last 168 hours. Coagulation Profile: Recent Labs  Lab 10/09/19 2348 10/11/19 0856 10/12/19 0543 10/12/19 1005 10/13/19 0538  INR 3.0* 3.7* 5.0* 4.9* 6.1*   Cardiac Enzymes: No results for input(s): CKTOTAL, CKMB, CKMBINDEX, TROPONINI in the last 168 hours. BNP (last 3 results) No  results for input(s): PROBNP in the last 8760 hours. HbA1C: No results for input(s): HGBA1C in the last 72 hours. CBG: Recent Labs  Lab 10/12/19 0801 10/12/19 1151 10/12/19 1631 10/12/19 2056 10/13/19 0737  GLUCAP 230* 221* 212* 180* 182*   Lipid Profile: No results for input(s): CHOL, HDL, LDLCALC, TRIG, CHOLHDL, LDLDIRECT in the last 72 hours. Thyroid Function Tests: No results for input(s): TSH, T4TOTAL, FREET4, T3FREE, THYROIDAB in the last 72 hours. Anemia Panel: No results for input(s): VITAMINB12, FOLATE, FERRITIN, TIBC, IRON, RETICCTPCT in the last 72 hours. Sepsis Labs: No results for input(s): PROCALCITON, LATICACIDVEN in the last 168 hours.  Recent Results (from the past 240 hour(s))  SARS CORONAVIRUS 2 (TAT 6-24 HRS) Nasopharyngeal Nasopharyngeal Swab     Status: None   Collection Time: 10/09/19 11:48 PM   Specimen: Nasopharyngeal Swab  Result Value Ref Range Status   SARS  Coronavirus 2 NEGATIVE NEGATIVE Final    Comment: (NOTE) SARS-CoV-2 target nucleic acids are NOT DETECTED. The SARS-CoV-2 RNA is generally detectable in upper and lower respiratory specimens during the acute phase of infection. Negative results do not preclude SARS-CoV-2 infection, do not rule out co-infections with other pathogens, and should not be used as the sole basis for treatment or other patient management decisions. Negative results must be combined with clinical observations, patient history, and epidemiological information. The expected result is Negative. Fact Sheet for Patients: SugarRoll.be Fact Sheet for Healthcare Providers: https://www.woods-mathews.com/ This test is not yet approved or cleared by the [place] FDA and  has been authorized for detection and/or diagnosis of SARS-CoV-2 by FDA under an Emergency Use Authorization (EUA). This EUA will remain  in effect (meaning this test can be used) for the duration of the COVID-19 declaration under Section 56 4(b)(1) of the Act, 21 U.S.C. section 360bbb-3(b)(1), unless the authorization is terminated or revoked sooner. Performed at [facility], [place] [address] [place], [place] [address]       Imaging Studies   ECHOCARDIOGRAM COMPLETE  Result Date: 10/11/2019    ECHOCARDIOGRAM REPORT   Patient Name:   Kimberly Peterson Date of Exam: 10/11/2019 Medical Rec #:  [id number]       Height:       62.0 in Accession #:    [id number]      Weight:       197.6 lb Date of Birth:  02/09/1930        BSA:          1.902 m Patient Age:    89 years        BP:           126/55 mmHg Patient Gender: F               HR:           65 bpm. Exam Location:  [facility] Procedure: 2D Echo, Color Doppler and Cardiac Doppler Indications:     I50.31 CHF-Acute Diastolic  History:         Patient has prior history of Echocardiogram examinations, most                  recent 07/12/2019. CHF, COPD and CKD; Risk  Factors:Hypertension                  and Diabetes.  Sonographer:     Charmayne Sheer RDCS (AE) Referring Phys:  [id number] Athena Masse Diagnosing Phys: Nelva Bush MD  Sonographer Comments: Technically difficult study due  to poor echo windows. Image acquisition challenging due to patient body habitus, performed w/ pt sitting in chair, Image acquisition challenging due to respiratory motion and Image acquisition challenging due to COPD. IMPRESSIONS  1. Left ventricular ejection fraction, by estimation, is >55%. The left ventricle has normal function. Left ventricular endocardial border not optimally defined to evaluate regional wall motion. There is mild left ventricular hypertrophy. Left ventricular diastolic parameters are indeterminate.  2. Right ventricular systolic function was not well visualized. The right ventricular size is not well visualized.  3. The mitral valve is abnormal. Mild mitral valve regurgitation. Mild to moderate mitral stenosis.  4. The aortic valve was not well visualized. Aortic valve regurgitation is not visualized. No aortic stenosis is present. FINDINGS  Left Ventricle: Left ventricular ejection fraction, by estimation, is >55%. The left ventricle has normal function. Left ventricular endocardial border not optimally defined to evaluate regional wall motion. The left ventricular internal cavity size was  normal in size. There is mild left ventricular hypertrophy. Left ventricular diastolic parameters are indeterminate. Right Ventricle: The right ventricular size is not well visualized. Right vetricular wall thickness was not assessed. Right ventricular systolic function was not well visualized. Left Atrium: Left atrial size was normal in size. Right Atrium: Right atrial size was not well visualized. Pericardium: The pericardium was not well visualized. Mitral Valve: The mitral valve is abnormal. There is severe thickening of the mitral valve leaflet(s). Severe mitral annular  calcification. Mild mitral valve regurgitation. Mild to moderate mitral valve stenosis. MV peak gradient, 12.1 mmHg. The mean mitral valve gradient is 5.0 mmHg. Tricuspid Valve: The tricuspid valve is not well visualized. Tricuspid valve regurgitation is not demonstrated. Aortic Valve: The aortic valve was not well visualized. Aortic valve regurgitation is not visualized. No aortic stenosis is present. Aortic valve mean gradient measures 6.0 mmHg. Aortic valve peak gradient measures 12.1 mmHg. Aortic valve area, by VTI measures 1.94 cm. Pulmonic Valve: The pulmonic valve was not well visualized. Pulmonic valve regurgitation is not visualized. No evidence of pulmonic stenosis. Aorta: The aortic root is normal in size and structure. Pulmonary Artery: The pulmonary artery is not well seen. Venous: The inferior vena cava was not well visualized. IAS/Shunts: The interatrial septum was not well visualized.  LEFT VENTRICLE PLAX 2D LVIDd:         4.07 cm LVIDs:         3.75 cm LV PW:         1.28 cm LV IVS:        0.99 cm LVOT diam:     2.30 cm LV SV:         64 LV SV Index:   34 LVOT Area:     4.15 cm  LEFT ATRIUM           Index LA diam:      5.90 cm 3.10 cm/m LA Vol (A2C): 50.8 ml 26.71 ml/m  AORTIC VALVE                    PULMONIC VALVE AV Area (Vmax):    1.68 cm     PV Vmax:       0.93 m/s AV Area (Vmean):   2.02 cm     PV Vmean:      57.900 cm/s AV Area (VTI):     1.94 cm     PV VTI:        0.199 m AV Vmax:  174.00 cm/s  PV Peak grad:  3.5 mmHg AV Vmean:          110.000 cm/s PV Mean grad:  2.0 mmHg AV VTI:            0.330 m AV Peak Grad:      12.1 mmHg AV Mean Grad:      6.0 mmHg LVOT Vmax:         70.30 cm/s LVOT Vmean:        53.600 cm/s LVOT VTI:          0.154 m LVOT/AV VTI ratio: 0.47  AORTA Ao Root diam: 2.70 cm MITRAL VALVE MV Area (PHT): 3.48 cm     SHUNTS MV Peak grad:  12.1 mmHg    Systemic VTI:  0.15 m MV Mean grad:  5.0 mmHg     Systemic Diam: 2.30 cm MV Vmax:       1.74 m/s MV Vmean:       98.2 cm/s MV Decel Time: 218 msec MV E velocity: 187.00 cm/s MV A velocity: 69.00 cm/s MV E/A ratio:  2.71 Harrell Gave End MD Electronically signed by Nelva Bush MD Signature Date/Time: 10/11/2019/6:15:42 PM    Final      Medications   Scheduled Meds: . aspirin EC  81 mg Oral Daily  . atorvastatin  10 mg Oral Daily  . carvedilol  6.25 mg Oral BID WC  . diltiazem  240 mg Oral Daily  . hydrALAZINE  37.5 mg Oral TID  . insulin aspart  0-15 Units Subcutaneous TID WC  . insulin aspart  0-5 Units Subcutaneous QHS  . pantoprazole  40 mg Oral Daily  . sodium chloride flush  3 mL Intravenous Q12H  . sodium chloride flush  3 mL Intravenous Q12H  . Warfarin - Pharmacist Dosing Inpatient   Does not apply q1600   Continuous Infusions: . sodium chloride         LOS: 3 days    Time spent: 30 minutes    Ezekiel Slocumb, DO [facility]   If 7PM-7AM, please contact night-coverage [email or web address] 10/13/2019, 7:49 AM

## 2019-10-13 NOTE — Care Management Important Message (Signed)
Important Message  Patient Details  Name: Kimberly Peterson MRN: [id number] Date of Birth: 11/08/1929   Medicare Important Message Given:  Yes     Dannette Barbara 10/13/2019, 2:00 PM

## 2019-10-13 NOTE — Progress Notes (Signed)
ANTICOAGULATION CONSULT NOTE - Follow up [place] for Warfarin (home medication) Indication: atrial fibrillation  No Known Allergies  Patient Measurements: Height: 5\' 2"  (157.5 cm) Weight: 91.7 kg (202 lb 1.6 oz) IBW/kg (Calculated) : 50.1  Vital Signs: Temp: 98.2 F (36.8 C) (04/08 0736) Temp Source: Oral (04/08 0600) BP: 113/58 (04/08 0736) Pulse Rate: 68 (04/08 0736)  Labs: Recent Labs    10/11/19 0856 10/11/19 0856 10/12/19 0543 10/12/19 1005 10/13/19 0538  LABPROT 36.4*   < > 46.6* 45.4* 54.1*  INR 3.7*   < > 5.0* 4.9* 6.1*  CREATININE 1.70*  --  2.60*  --  3.48*   < > = values in this interval not displayed.    Estimated Creatinine Clearance: 11.8 mL/min (A) (by C-G formula based on SCr of 3.48 mg/dL (H)).   Medical History: Past Medical History:  Diagnosis Date  . Anemia   . CHF (congestive heart failure) ([place])   . Chronic kidney disease   . COPD (chronic obstructive pulmonary disease) ([place])   . Diabetes mellitus without complication ([place])   . Hypertension     Assessment: Pharmacy asked to manage Warfarin per protocol in this 86 yo female on warfarin PTA for AFib.  INR 3.0 on admission. Home warfarin regimen per PTA med list: 5 mg every day except 7.5 mg on Thursdays, last dose 4/4 PTA.   4/4 INR 3.0 Warfarin 2mg  4/6 INR 3.7 holding dose 4/7 INR 5.0, repeat INR@1005  4.9, Vit K 2.5mg  PO given, warfarin held    Goal of Therapy:  INR 2-3 - INR goal confirmed with hospitalist  Monitor platelets by anticoagulation protocol: Yes   Plan:  4/8 INR 6.1, supratherapeutic, likely due to CHF exacerbation. Spoke to nurse, patient has not signs or symptoms of bleeding/bruising. After discussion with hospitalist, will order Vitamin K 5mg  PO x 1 dose. Will hold warfarin dose for tonight again, as well. Daily INR and CBC at least every three days per policy  Pharmacy will continue to follow.   Reagyn Facemire A Shravan Salahuddin 10/13/2019,10:49 AM

## 2019-10-13 NOTE — Progress Notes (Signed)
CRITICAL VALUE ALERT  Critical Value:  INR 6.1  Date & Time Notied:  10/13/2019 0720  Provider Notified: Dr. Arbutus Ped  Orders Received/Actions taken: No new orders at this time.

## 2019-10-13 NOTE — Consult Note (Signed)
[facility]  CONSULT NOTE    Date: 10/13/2019                  Patient Name:  Kimberly Peterson  MRN: [id number]  DOB: Jun 14, 1930  Age / Sex: 86 y.o., female         PCP: [name], MD                 Service Requesting Consult: Dr. Arbutus Ped                 Reason for Consult: Acute renal failure            History of Present Illness: Kimberly Peterson was admitted on 10/09/2019 for acute exacerbation of diastolic congestive heart failure. Patient has been admitted for CHF three times in last six months: 04/2019, 07/2019, and 09/2019.   Patient states she has not urinated all day and is having stomach pains.      Medications: Outpatient medications: Medications Prior to Admission  Medication Sig Dispense Refill Last Dose  . aspirin 81 MG tablet Take 81 mg by mouth daily.   10/09/2019 at 1000  . atorvastatin (LIPITOR) 10 MG tablet TAKE 1 TABLET BY MOUTH ONCE DAILY FOR CHOLESTEROL (Patient taking differently: Take 10 mg by mouth daily. ) 90 tablet 3 10/09/2019 at 1000  . CARTIA XT 240 MG 24 hr capsule Take 1 capsule by mouth once daily (Patient taking differently: Take 240 mg by mouth daily. ) 90 capsule 1 10/09/2019 at 1000  . carvedilol (COREG) 6.25 MG tablet Take 6.25 mg by mouth 2 (two) times daily with a meal.    10/09/2019 at 1900  . hydrALAZINE (APRESOLINE) 25 MG tablet Take 1.5 tablets (37.5 mg total) by mouth 3 (three) times daily.   10/09/2019 at 1900  . insulin aspart protamine- aspart (NOVOLOG MIX 70/30) (70-30) 100 UNIT/ML injection Inject 0.2 mLs (20 Units total) into the skin 2 (two) times daily with a meal.   10/09/2019 at 1900  . ipratropium-albuterol (DUONEB) 0.5-2.5 (3) MG/3ML SOLN Take 3 mLs by nebulization every 6 (six) hours as needed. Dx-shortness of breath   prn at prn  . nystatin ointment (MYCOSTATIN) Apply 1 application topically 2 (two) times daily. 30 g 1 10/09/2019 at Unknown time  . pantoprazole (PROTONIX) 40 MG tablet TAKE 1 TABLET BY MOUTH  ONCE DAILY FOR STOMACH 90 tablet 1 10/09/2019 at 1900  . torsemide ([name]) 20 MG tablet Take 2 tablets (40 mg total) by mouth daily. 60 tablet 0 10/09/2019 at 1000  . warfarin (COUMADIN) 5 MG tablet Take 1-1.5 tablets (5-7.5 mg total) by mouth See admin instructions. Take 1 tablet (5mg ) by mouth every Monday, Tuesday. Wednesday, Friday, Saturday and Sunday evening and take 1 tablets (7.5mg ) by mouth every Thursday evening 10 tablet 0 10/09/2019 at 1800    Current medications: Current Facility-Administered Medications  Medication Dose Route Frequency Provider Last Rate Last Admin  . 0.9 %  sodium chloride infusion  250 mL Intravenous PRN Athena Masse, MD      . acetaminophen (TYLENOL) tablet 650 mg  650 mg Oral Q4H PRN Athena Masse, MD      . aspirin EC tablet 81 mg  81 mg Oral Daily Athena Masse, MD   81 mg at 10/13/19 0848  . atorvastatin (LIPITOR) tablet 10 mg  10 mg Oral Daily Athena Masse, MD   10 mg at 10/13/19 0848  . carvedilol (COREG)  tablet 6.25 mg  6.25 mg Oral BID WC Athena Masse, MD   6.25 mg at 10/13/19 0848  . diltiazem (CARDIZEM CD) 24 hr capsule 240 mg  240 mg Oral Daily Athena Masse, MD   240 mg at 10/13/19 0848  . feeding supplement (NEPRO CARB STEADY) liquid 237 mL  237 mL Oral BID BM Nicole Kindred A, DO      . hydrALAZINE (APRESOLINE) tablet 37.5 mg  37.5 mg Oral TID Athena Masse, MD   37.5 mg at 10/13/19 0847  . insulin aspart (novoLOG) injection 0-15 Units  0-15 Units Subcutaneous TID WC Athena Masse, MD   3 Units at 10/13/19 1211  . insulin aspart (novoLOG) injection 0-5 Units  0-5 Units Subcutaneous QHS Athena Masse, MD   2 Units at 10/11/19 2148  . ipratropium-albuterol (DUONEB) 0.5-2.5 (3) MG/3ML nebulizer solution 3 mL  3 mL Nebulization Q6H PRN Athena Masse, MD   3 mL at 10/10/19 1832  . ondansetron (ZOFRAN) tablet 4 mg  4 mg Oral Q6H PRN Athena Masse, MD   4 mg at 10/12/19 2014   Or  . ondansetron [facility]) injection 4 mg  4 mg  Intravenous Q6H PRN Athena Masse, MD   4 mg at 10/13/19 1427  . pantoprazole (PROTONIX) EC tablet 40 mg  40 mg Oral Daily Athena Masse, MD   40 mg at 10/13/19 0848  . senna-docusate (Senokot-S) tablet 1 tablet  1 tablet Oral QHS PRN Judd Gaudier V, MD      . sodium chloride flush (NS) 0.9 % injection 3 mL  3 mL Intravenous Q12H Athena Masse, MD   3 mL at 10/13/19 0849  . sodium chloride flush (NS) 0.9 % injection 3 mL  3 mL Intravenous PRN Judd Gaudier V, MD      . sodium chloride flush (NS) 0.9 % injection 3 mL  3 mL Intravenous Q12H Athena Masse, MD   3 mL at 10/13/19 0848  . traZODone (DESYREL) tablet 50 mg  50 mg Oral QHS PRN Lang Snow, NP   50 mg at 10/11/19 0225  . Warfarin - Pharmacist Dosing Inpatient   Does not apply [id number] Rocky Morel, [facility]   Given at 10/12/19 1711      Allergies: No Known Allergies    Past Medical History: Past Medical History:  Diagnosis Date  . Anemia   . CHF (congestive heart failure) ([place])   . Chronic kidney disease   . COPD (chronic obstructive pulmonary disease) ([place])   . Diabetes mellitus without complication ([place])   . Hypertension      Past Surgical History: Past Surgical History:  Procedure Laterality Date  . aoric valve replacemet      st Jude  . CATARACT EXTRACTION, BILATERAL    . COLONOSCOPY WITH PROPOFOL N/A 07/17/2019   Procedure: COLONOSCOPY WITH PROPOFOL;  Surgeon: Lin Landsman, MD;  Location: [facility] ENDOSCOPY;  Service: Gastroenterology;  Laterality: N/A;  . ESOPHAGOGASTRODUODENOSCOPY N/A 07/17/2019   Procedure: ESOPHAGOGASTRODUODENOSCOPY (EGD);  Surgeon: Lin Landsman, MD;  Location: [facility] ENDOSCOPY;  Service: Gastroenterology;  Laterality: N/A;  . PACEMAKER PLACEMENT    . VALVE REPLACEMENT       Family History: History reviewed. No pertinent family history.   Social History: Social History   Socioeconomic History  . Marital status: Widowed    Spouse name: Not on file  . Number of  children: Not on file  . Years of  education: Not on file  . Highest education level: Not on file  Occupational History  . Not on file  Tobacco Use  . Smoking status: Never Smoker  . Smokeless tobacco: Never Used  Substance and Sexual Activity  . Alcohol use: No  . Drug use: No  . Sexual activity: Never  Other Topics Concern  . Not on file  Social History Narrative  . Not on file   Social Determinants of Health   Financial Resource Strain: Low Risk   . Difficulty of Paying Living Expenses: Not hard at all  Food Insecurity: No Food Insecurity  . Worried About Charity fundraiser in the Last Year: Never true  . Ran Out of Food in the Last Year: Never true  Transportation Needs: No Transportation Needs  . Lack of Transportation (Medical): No  . Lack of Transportation (Non-Medical): No  Physical Activity:   . Days of Exercise per Week:   . Minutes of Exercise per Session:   Stress:   . Feeling of Stress :   Social Connections:   . Frequency of Communication with Friends and Family:   . Frequency of Social Gatherings with Friends and Family:   . Attends Religious Services:   . Active Member of Clubs or Organizations:   . Attends Archivist Meetings:   Marland Kitchen Marital Status:   Intimate Partner Violence:   . Fear of Current or Ex-Partner:   . Emotionally Abused:   Marland Kitchen Physically Abused:   . Sexually Abused:      Review of Systems: Review of Systems  Unable to perform ROS: Medical condition    Vital Signs: Blood pressure (!) 118/54, pulse 63, temperature 98.1 F (36.7 C), resp. rate 17, height 5\' 2"  (1.575 m), weight 91.7 kg, SpO2 96 %.  Weight trends: Filed Weights   10/10/19 0451 10/11/19 0556 10/12/19 1246  Weight: 91.9 kg 89.6 kg 91.7 kg    Physical Exam: General: NAD, seated in a chair  Head: Normocephalic, atraumatic. Moist oral mucosal membranes  Eyes: Anicteric, PERRL  Neck: Supple, trachea midline  Lungs:  Diminished bilaterally.   Heart: +  murmur, regular  Abdomen:  Soft, nontender, +obese  Extremities: + peripheral edema.  Neurologic: Nonfocal, moving all four extremities  Skin: No lesions         Lab results: Basic Metabolic Panel: Recent Labs  Lab 10/11/19 0856 10/12/19 0543 10/13/19 0538  NA 140 134* 137  K 4.4 4.8 5.2*  CL 99 96* 96*  CO2 26 27 29   GLUCOSE 211* 246* 196*  BUN 37* 55* 65*  CREATININE 1.70* 2.60* 3.48*  CALCIUM 9.0 8.8* 8.7*  MG 2.5* 2.8*  --     Liver Function Tests: Recent Labs  Lab 10/09/19 2348  AST 24  ALT 12  ALKPHOS 72  BILITOT 0.8  PROT 7.5  ALBUMIN 3.5   No results for input(s): LIPASE, AMYLASE in the last 168 hours. No results for input(s): AMMONIA in the last 168 hours.  CBC: Recent Labs  Lab 10/09/19 2348 10/13/19 0533  WBC 5.5 6.0  NEUTROABS 3.4  --   HGB 10.4* 10.0*  HCT 35.2* 34.2*  MCV 80.9 83.2  PLT 221 188    Cardiac Enzymes: No results for input(s): CKTOTAL, CKMB, CKMBINDEX, TROPONINI in the last 168 hours.  BNP: Invalid input(s): POCBNP  CBG: Recent Labs  Lab 10/12/19 1631 10/12/19 2056 10/13/19 0737 10/13/19 1128 10/13/19 1640  GLUCAP 212* 180* 182* 171* 147*    Microbiology:  Results for orders placed or performed during the hospital encounter of 10/09/19  SARS CORONAVIRUS 2 (TAT 6-24 HRS) Nasopharyngeal Nasopharyngeal Swab     Status: None   Collection Time: 10/09/19 11:48 PM   Specimen: Nasopharyngeal Swab  Result Value Ref Range Status   SARS Coronavirus 2 NEGATIVE NEGATIVE Final    Comment: (NOTE) SARS-CoV-2 target nucleic acids are NOT DETECTED. The SARS-CoV-2 RNA is generally detectable in upper and lower respiratory specimens during the acute phase of infection. Negative results do not preclude SARS-CoV-2 infection, do not rule out co-infections with other pathogens, and should not be used as the sole basis for treatment or other patient management decisions. Negative results must be combined with clinical  observations, patient history, and epidemiological information. The expected result is Negative. Fact Sheet for Patients: SugarRoll.be Fact Sheet for Healthcare Providers: https://www.woods-mathews.com/ This test is not yet approved or cleared by the [place] FDA and  has been authorized for detection and/or diagnosis of SARS-CoV-2 by FDA under an Emergency Use Authorization (EUA). This EUA will remain  in effect (meaning this test can be used) for the duration of the COVID-19 declaration under Section 56 4(b)(1) of the Act, 21 U.S.C. section 360bbb-3(b)(1), unless the authorization is terminated or revoked sooner. Performed at [facility], [place] [address] [place], [place] [address]     Coagulation Studies: Recent Labs    10/12/19 0543 10/12/19 1005 10/13/19 0538  LABPROT 46.6* 45.4* 54.1*  INR 5.0* 4.9* 6.1*    Urinalysis: No results for input(s): COLORURINE, LABSPEC, PHURINE, GLUCOSEU, HGBUR, BILIRUBINUR, KETONESUR, PROTEINUR, UROBILINOGEN, NITRITE, LEUKOCYTESUR in the last 72 hours.  Invalid input(s): APPERANCEUR    Imaging:  No results found.   Assessment & Plan: Kimberly Peterson is a 86 y.o. black female with diastolic congestive heart failure, COPD on 2L O2, diabetes mellitus type II insulin dependent, hypertension, atrial fibrillation, obstructive sleep apnea, pacemaker, aortic valve replacement on warfarin, who was admitted to [facility] on 10/09/2019 for Bilateral pleural effusion [J90] Acute on chronic diastolic congestive heart failure (HCC) [I50.33] Chronic respiratory failure with hypoxia (HCC) [J96.11] Acute on chronic diastolic (congestive) heart failure ([place]) [I50.33]  1. Acute renal failure on chronic kidney disease stage IIIB with proteinuria: baseline creatinine of 1.1, GFR of 45 in 07/16/2019.  Chronic Kidney Disease seems to be secondary to diabetes and hypertension.  Acute renal failure most likely  secondary to cardiorenal syndrome.  No IV contrast exposure. Bladder scan revealed no urine.  - check renal ultrasound - CKD work up labs.   2. Hypertension: with acute exacerbation of diastolic congestive heart failure and atrial fibrillation. Furosemide held since 4/6.  Home regimen of diltiazem, carvedilol, hydralazine, and torsemide.   3. Diabetes mellitus type II with chronic kidney disease: insulin dependent. Hemoglobin A1c of 6.2% on 09/15/19.   4. Anemia with chronic kidney disease: hemoglobin of 10. Normocytic.       LOS: 3 Korie Streat 4/8/20214:49 PM

## 2019-10-14 DIAGNOSIS — I4811 Longstanding persistent atrial fibrillation: Secondary | ICD-10-CM | POA: Diagnosis not present

## 2019-10-14 DIAGNOSIS — I5033 Acute on chronic diastolic (congestive) heart failure: Secondary | ICD-10-CM | POA: Diagnosis not present

## 2019-10-14 DIAGNOSIS — I10 Essential (primary) hypertension: Secondary | ICD-10-CM | POA: Diagnosis not present

## 2019-10-14 DIAGNOSIS — E1151 Type 2 diabetes mellitus with diabetic peripheral angiopathy without gangrene: Secondary | ICD-10-CM | POA: Diagnosis not present

## 2019-10-14 LAB — BASIC METABOLIC PANEL
Anion gap: 15 (ref 5–15)
BUN: 81 mg/dL — ABNORMAL HIGH (ref 8–23)
CO2: 26 mmol/L (ref 22–32)
Calcium: 8.9 mg/dL (ref 8.9–10.3)
Chloride: 93 mmol/L — ABNORMAL LOW (ref 98–111)
Creatinine, Ser: 4.51 mg/dL — ABNORMAL HIGH (ref 0.44–1.00)
GFR calc Af Amer: 9 mL/min — ABNORMAL LOW (ref >60)
GFR calc non Af Amer: 8 mL/min — ABNORMAL LOW (ref >60)
Glucose, Bld: 184 mg/dL — ABNORMAL HIGH (ref 70–99)
Potassium: 5.6 mmol/L — ABNORMAL HIGH (ref 3.5–5.1)
Sodium: 134 mmol/L — ABNORMAL LOW (ref 135–145)

## 2019-10-14 LAB — HEPATITIS B SURFACE ANTIGEN: Hepatitis B Surface Ag: NONREACTIVE

## 2019-10-14 LAB — HEPATITIS B SURFACE ANTIBODY,QUALITATIVE: Hep B S Ab: NONREACTIVE

## 2019-10-14 LAB — HEPATITIS B CORE ANTIBODY, IGM: Hep B C IgM: NONREACTIVE

## 2019-10-14 LAB — GLUCOSE, CAPILLARY
Glucose-Capillary: 194 mg/dL — ABNORMAL HIGH (ref 70–99)
Glucose-Capillary: 154 mg/dL — ABNORMAL HIGH (ref 70–99)
Glucose-Capillary: 207 mg/dL — ABNORMAL HIGH (ref 70–99)
Glucose-Capillary: 212 mg/dL — ABNORMAL HIGH (ref 70–99)

## 2019-10-14 LAB — PROTIME-INR
INR: 5.3 (ref 0.8–1.2)
Prothrombin Time: 48.4 seconds — ABNORMAL HIGH (ref 11.4–15.2)

## 2019-10-14 LAB — HEPATITIS C ANTIBODY: HCV Ab: NONREACTIVE

## 2019-10-14 MED ORDER — SODIUM ZIRCONIUM CYCLOSILICATE 10 G PO PACK
10.0000 g | PACK | Freq: Once | ORAL | Status: AC
  Administered 2019-10-14: 10 g via ORAL
  Filled 2019-10-14: qty 1

## 2019-10-14 MED ORDER — PHYTONADIONE 5 MG PO TABS
5.0000 mg | ORAL_TABLET | Freq: Once | ORAL | Status: AC
  Administered 2019-10-14: 5 mg via ORAL
  Filled 2019-10-14: qty 1

## 2019-10-14 MED ORDER — FUROSEMIDE 10 MG/ML IJ SOLN
40.0000 mg | Freq: Once | INTRAMUSCULAR | Status: AC
  Administered 2019-10-14: 40 mg via INTRAVENOUS
  Filled 2019-10-14: qty 4

## 2019-10-14 NOTE — Progress Notes (Signed)
PROGRESS NOTE    Kimberly Peterson   [id number]  DOB: [birth year]  PCP: [name], MD    DOA: 10/09/2019 LOS: 4    Brief Narrative   Kimberly Peterson is an 86 y.o. femalewith medical history significant fordiastolic CHF, CKD 3a, COPD, DM 2, HTN, chronic respiratory failure on 2 L/min home oxygen, chronic A. fib on Coumadin, OSA on CPAP and history of pacemaker and aortic valve replacement, hospitalized from 09/15/2019 to 09/22/2019 with CHF exacerbation who presented  to the ED on the night of 10/09/19  with the of shortness of breath, orthopnea and worsening lower extremity edema.  Admitted for management of acute on chronic diastolic CHF.  Patient developed worsening renal function and decreased urine output.  Nephrology consulted.   Assessment & Plan   Active Problems:   Benign essential HTN   Chronic obstructive pulmonary disease (HCC)   Atrial fibrillation (HCC)   Type 2 diabetes with peripheral circulatory disorder, controlled (HCC)   Acute on chronic diastolic heart failure (HCC)   Elevated troponin   CKD (chronic kidney disease) stage 3, GFR 30-59 ml/min   Chronic respiratory failure with hypoxia (HCC)   H/O aortic valve replacement   Acute on chronic diastolic (congestive) heart failure (HCC)  Acute on chronic diastolic heart failure Severe pulmonary hypertension Patient with recurrent admissions for decompensated CHF.  Had just been discharged 2 weeks prior for same.  Again presented with dyspnea at rest, pedal edema.  Echocardiogram on 10/11/2019 showed EF greater than 55%, indeterminate diastolic function.  ReDS clip reading on 4/7 was 40, but patient has mod/large R pleural effusion and small one on left. -Hold IV Lasix due to AKI -Continue Coreg.  --Not on ACE/ARB likely secondary to CKD -Daily weights, intake and output monitoring, salt and fluid restriction --Follow-up with cardiology within 1 week of discharge (Dr. [name])  AKI superimposed on CKD stage IIIa  -baseline renal function appears to be creatinine 1.1-1.3.  Creatinine up again, now 4.5 (from 3.48 <-- 2.60 <--  1.5 on admission), likely secondary to diuresis, possibly developing ATN.  Worsened creatinine despite holding diuretics.  ?Cardiorenal. Renal ultrasound showed chronic changes of CKD but nothing acute. --Trial of IV lasix again today due to increased SOB, edema, abdominal fullness after diuresis held.  No urine output hours later and <150 cc on bladder scan. -monitor BMP --nephrology consulted  --patient not interested in dialysis --will discuss palliative consult w patient and order if she agrees  Hyperkalemia - due to renal failure --Lokelma today --BMP in AM  Nausea -resolved -Antiemetics as needed  Supratherapeutic INR -INR little better, still >5.   Patient got 2.5 mg PO vitamin K past 2 days, 5 mg today.   --pharmacy following --hold warfarin --recheck INR tomorrow  Atrial fibrillation ([place]) -Continue Coumadin when INR comes down --Continue Coreg -Pharmacy to adjust Coumadin based on INR   Mildly elevated troponin -Troponins flat and likely related to demand ischemia from CHF exacerbation.  Chronic respiratory failure with hypoxia (HCC) Continue oxygen via nasal cannula.  Maintain O2 sat greater than 90%.  S/p pacemaker and aortic valve replacement -No acute issues. Patient gets her routine pacemaker checks -Follows with Dr. [name] at [place] [facility]  Benign essential HTN -continue Cartia, carvedilol and hydralazine  Chronic obstructive pulmonary disease ([place]) -DuoNebs as needed  Type 2 diabetes with peripheral circulatory disorder, controlled ([place]) -Sliding scale insulin for now pending med rec  OSA Continue CPAP at night.   Morbid obesity: Body  mass index is 36.01 kg/m.  Contributes to shortness of breath, overall complicating factor.  DVT prophylaxis: on warfarin Diet:  Diet Orders (From admission, onward)    Start     Ordered    10/10/19 0132  Diet heart healthy/carb modified Room service appropriate? Yes; Fluid consistency: Thin  Diet effective now    Question Answer Comment  Diet-HS Snack? Nothing   Room service appropriate? Yes   Fluid consistency: Thin      10/10/19 0136            Code Status: Full Code    Subjective 10/14/19    Patient seen up in chair this morning.  No acute events reported overnight.  She was sleeping sitting in the chair but awoke to voice.  She does report feeling more short of breath today.  Also states legs feel more swollen and tight.  Denies fevers or chills, pain or other acute complaints.   Disposition Plan & Communication   Dispo & Barriers: Anticipate discharge home pending improvement in renal function and normalization of elevated INR.  Patient with worsening AKI on CKD due to cardiorenal syndrome. Coming from: Home Exp d/c date: TBD, 4/12? Medically stable for d/c?  No  Family Communication: None at bedside during encounter, will attempt to call   Consults, Procedures, Significant Events   Consultants:   Nephrology  Pharmacy, warfarin dosing  Procedures:   None  Antimicrobials:   None   Objective   Vitals:   10/13/19 1957 10/14/19 0257 10/14/19 0834 10/14/19 1639  BP: (!) 117/54 (!) 99/55 106/79 (!) 119/54  Pulse: 64 64 64 65  Resp: 20 20    Temp: 97.6 F (36.4 C) (!) 97.5 F (36.4 C)  98.7 F (37.1 C)  TempSrc:  Oral  Oral  SpO2: 96% 92%  97%  Weight:  89.3 kg    Height:        Intake/Output Summary (Last 24 hours) at 10/14/2019 2012 Last data filed at 10/14/2019 1033 Gross per 24 hour  Intake 275 ml  Output -  Net 275 ml   Filed Weights   10/11/19 0556 10/12/19 1246 10/14/19 0257  Weight: 89.6 kg 91.7 kg 89.3 kg    Physical Exam:  General exam: awake, alert, no acute distress, obese HEENT: moist mucus membranes, hearing grossly normal  Respiratory system: basilar crackles, increased respiratory effort with abdominal  breathing, on 5 L/min nasal cannula oxygen. Cardiovascular system: normal [id number], RRR, 2/6 systolic murmur, right greater than left lower extremity pitting edema again worse today.   Central nervous system: A&O x3. no gross focal neurologic deficits, normal speech Extremities: moves all, no edema, normal tone Psychiatry: normal mood, congruent affect, judgement and insight appear normal  Labs   Data Reviewed: I have personally reviewed following labs and imaging studies  CBC: Recent Labs  Lab 10/09/19 2348 10/13/19 0533  WBC 5.5 6.0  NEUTROABS 3.4  --   HGB 10.4* 10.0*  HCT 35.2* 34.2*  MCV 80.9 83.2  PLT 221 0000000   Basic Metabolic Panel: Recent Labs  Lab 10/09/19 2348 10/11/19 0856 10/12/19 0543 10/13/19 0538 10/14/19 0746  NA 142 140 134* 137 134*  K 3.9 4.4 4.8 5.2* 5.6*  CL 104 99 96* 96* 93*  CO2 29 26 27 29 26   GLUCOSE 98 211* 246* 196* 184*  BUN 26* 37* 55* 65* 81*  CREATININE 1.50* 1.70* 2.60* 3.48* 4.51*  CALCIUM 8.9 9.0 8.8* 8.7* 8.9  MG  --  2.5* 2.8*  --   --  GFR: Estimated Creatinine Clearance: 9 mL/min (A) (by C-G formula based on SCr of 4.51 mg/dL (H)). Liver Function Tests: Recent Labs  Lab 10/09/19 2348  AST 24  ALT 12  ALKPHOS 72  BILITOT 0.8  PROT 7.5  ALBUMIN 3.5   No results for input(s): LIPASE, AMYLASE in the last 168 hours. No results for input(s): AMMONIA in the last 168 hours. Coagulation Profile: Recent Labs  Lab 10/11/19 0856 10/12/19 0543 10/12/19 1005 10/21/19 0538 10/14/19 0746  INR 3.7* 5.0* 4.9* 6.1* 5.3*   Cardiac Enzymes: No results for input(s): CKTOTAL, CKMB, CKMBINDEX, TROPONINI in the last 168 hours. BNP (last 3 results) No results for input(s): PROBNP in the last 8760 hours. HbA1C: No results for input(s): HGBA1C in the last 72 hours. CBG: Recent Labs  Lab 10-21-2019 1640 2019/10/21 2105 10/14/19 0750 10/14/19 1142 10/14/19 1657  GLUCAP 147* 147* 194* 154* 207*   Lipid Profile: No results for  input(s): CHOL, HDL, LDLCALC, TRIG, CHOLHDL, LDLDIRECT in the last 72 hours. Thyroid Function Tests: No results for input(s): TSH, T4TOTAL, FREET4, T3FREE, THYROIDAB in the last 72 hours. Anemia Panel: No results for input(s): VITAMINB12, FOLATE, FERRITIN, TIBC, IRON, RETICCTPCT in the last 72 hours. Sepsis Labs: No results for input(s): PROCALCITON, LATICACIDVEN in the last 168 hours.  Recent Results (from the past 240 hour(s))  SARS CORONAVIRUS 2 (TAT 6-24 HRS) Nasopharyngeal Nasopharyngeal Swab     Status: None   Collection Time: 10/09/19 11:48 PM   Specimen: Nasopharyngeal Swab  Result Value Ref Range Status   SARS Coronavirus 2 NEGATIVE NEGATIVE Final    Comment: (NOTE) SARS-CoV-2 target nucleic acids are NOT DETECTED. The SARS-CoV-2 RNA is generally detectable in upper and lower respiratory specimens during the acute phase of infection. Negative results do not preclude SARS-CoV-2 infection, do not rule out co-infections with other pathogens, and should not be used as the sole basis for treatment or other patient management decisions. Negative results must be combined with clinical observations, patient history, and epidemiological information. The expected result is Negative. Fact Sheet for Patients: SugarRoll.be Fact Sheet for Healthcare Providers: https://www.woods-mathews.com/ This test is not yet approved or cleared by the [place] FDA and  has been authorized for detection and/or diagnosis of SARS-CoV-2 by FDA under an Emergency Use Authorization (EUA). This EUA will remain  in effect (meaning this test can be used) for the duration of the COVID-19 declaration under Section 56 4(b)(1) of the Act, 21 U.S.C. section 360bbb-3(b)(1), unless the authorization is terminated or revoked sooner. Performed at [facility], [place] [address] [place], [place] [address]       Imaging Studies   US RENAL  Result Date: October 21, 2019  CLINICAL DATA:  Acute renal failure. EXAM: RENAL / URINARY TRACT ULTRASOUND COMPLETE COMPARISON:  September 07, 2013.  CT dated Nov 21, 2013 FINDINGS: Right Kidney: Renal measurements: 9.5 x 4.4 x 5.1 cm = volume: 111 mL. There is no hydronephrosis. There is increased cortical echogenicity. Left Kidney: Renal measurements: 10.7 x 5 x 4.8 cm = volume: 135 mL. There is no hydronephrosis. There is increased cortical echogenicity. Bladder: Appears normal for degree of bladder distention. Other: None. IMPRESSION: 1. No acute abnormality. 2. Increased cortical echogenicity bilaterally which can be seen in patients with medical renal disease. Electronically Signed   By: Constance Holster M.D.   On: Oct 21, 2019 23:28     Medications   Scheduled Meds: . aspirin EC  81 mg Oral Daily  . atorvastatin  10 mg  Oral Daily  . carvedilol  6.25 mg Oral BID WC  . diltiazem  240 mg Oral Daily  . feeding supplement (NEPRO CARB STEADY)  237 mL Oral BID BM  . insulin aspart  0-15 Units Subcutaneous TID WC  . insulin aspart  0-5 Units Subcutaneous QHS  . pantoprazole  40 mg Oral Daily  . sodium chloride flush  3 mL Intravenous Q12H  . sodium chloride flush  3 mL Intravenous Q12H  . Warfarin - Pharmacist Dosing Inpatient   Does not apply q1600   Continuous Infusions: . sodium chloride         LOS: 4 days    Time spent: 30 minutes    Ezekiel Slocumb, DO [facility]   If 7PM-7AM, please contact night-coverage [email or web address] 10/14/2019, 8:12 PM

## 2019-10-14 NOTE — Progress Notes (Addendum)
CRITICAL VALUE ALERT  Critical Value:  INR 5.3  Date & Time Notied:  10/14/2019 900am Provider Notified: messaged   MD ordered Vit. K

## 2019-10-14 NOTE — Progress Notes (Signed)
[facility]  ROUNDING NOTE   Subjective:   Creatinine 4.51 (3.48) K 5.6 (5.2)  Patient did urinate overnight.  Furosemide given today.   Objective:  Vital signs in last 24 hours:  Temp:  [97.5 F (36.4 C)-98.1 F (36.7 C)] 97.5 F (36.4 C) (04/09 0257) Pulse Rate:  [63-64] 64 (04/09 0834) Resp:  [17-20] 20 (04/09 0257) BP: (99-118)/(54-79) 106/79 (04/09 0834) SpO2:  [92 %-96 %] 92 % (04/09 0257) Weight:  [89.3 kg] 89.3 kg (04/09 0257)  Weight change: -2.359 kg Filed Weights   10/11/19 0556 10/12/19 1246 10/14/19 0257  Weight: 89.6 kg 91.7 kg 89.3 kg    Intake/Output: I/O last 3 completed shifts: In: 1 [P.O.:240; I.V.:6] Out: 0    Intake/Output this shift:  Total I/O In: 275 [P.O.:275] Out: -   Physical Exam: General: NAD, seated in chair  Head: Normocephalic, atraumatic. Moist oral mucosal membranes  Eyes: +blind  Neck: Supple, trachea midline  Lungs:  Bilateral crackles  Heart: Bradycardia,   Abdomen:  Soft, nontender, obese  Extremities: + peripheral edema.  Neurologic: Nonfocal, moving all four extremities  Skin: No lesions        Basic Metabolic Panel: Recent Labs  Lab 10/09/19 2348 10/09/19 2348 10/11/19 0856 10/11/19 0856 10/12/19 0543 10/13/19 0538 10/14/19 0746  NA 142  --  140  --  134* 137 134*  K 3.9  --  4.4  --  4.8 5.2* 5.6*  CL 104  --  99  --  96* 96* 93*  CO2 29  --  26  --  27 29 26   GLUCOSE 98  --  211*  --  246* 196* 184*  BUN 26*  --  37*  --  55* 65* 81*  CREATININE 1.50*  --  1.70*  --  2.60* 3.48* 4.51*  CALCIUM 8.9   < > 9.0   < > 8.8* 8.7* 8.9  MG  --   --  2.5*  --  2.8*  --   --    < > = values in this interval not displayed.    Liver Function Tests: Recent Labs  Lab 10/09/19 2348  AST 24  ALT 12  ALKPHOS 72  BILITOT 0.8  PROT 7.5  ALBUMIN 3.5   No results for input(s): LIPASE, AMYLASE in the last 168 hours. No results for input(s): AMMONIA in the last 168 hours.  CBC: Recent Labs   Lab 10/09/19 2348 10/13/19 0533  WBC 5.5 6.0  NEUTROABS 3.4  --   HGB 10.4* 10.0*  HCT 35.2* 34.2*  MCV 80.9 83.2  PLT 221 188    Cardiac Enzymes: No results for input(s): CKTOTAL, CKMB, CKMBINDEX, TROPONINI in the last 168 hours.  BNP: Invalid input(s): POCBNP  CBG: Recent Labs  Lab 10/13/19 1128 10/13/19 1640 10/13/19 2105 10/14/19 0750 10/14/19 1142  GLUCAP 171* 147* 147* 194* 154*    Microbiology: Results for orders placed or performed during the hospital encounter of 10/09/19  SARS CORONAVIRUS 2 (TAT 6-24 HRS) Nasopharyngeal Nasopharyngeal Swab     Status: None   Collection Time: 10/09/19 11:48 PM   Specimen: Nasopharyngeal Swab  Result Value Ref Range Status   SARS Coronavirus 2 NEGATIVE NEGATIVE Final    Comment: (NOTE) SARS-CoV-2 target nucleic acids are NOT DETECTED. The SARS-CoV-2 RNA is generally detectable in upper and lower respiratory specimens during the acute phase of infection. Negative results do not preclude SARS-CoV-2 infection, do not rule out co-infections with other pathogens, and should  not be used as the sole basis for treatment or other patient management decisions. Negative results must be combined with clinical observations, patient history, and epidemiological information. The expected result is Negative. Fact Sheet for Patients: SugarRoll.be Fact Sheet for Healthcare Providers: https://www.woods-mathews.com/ This test is not yet approved or cleared by the [place] FDA and  has been authorized for detection and/or diagnosis of SARS-CoV-2 by FDA under an Emergency Use Authorization (EUA). This EUA will remain  in effect (meaning this test can be used) for the duration of the COVID-19 declaration under Section 56 4(b)(1) of the Act, 21 U.S.C. section 360bbb-3(b)(1), unless the authorization is terminated or revoked sooner. Performed at [facility], [place] [address], [place],  [place] [address]     Coagulation Studies: Recent Labs    10/12/19 0543 10/12/19 1005 10/13/19 0538 10/14/19 0746  LABPROT 46.6* 45.4* 54.1* 48.4*  INR 5.0* 4.9* 6.1* 5.3*    Urinalysis: No results for input(s): COLORURINE, LABSPEC, PHURINE, GLUCOSEU, HGBUR, BILIRUBINUR, KETONESUR, PROTEINUR, UROBILINOGEN, NITRITE, LEUKOCYTESUR in the last 72 hours.  Invalid input(s): APPERANCEUR    Imaging: US RENAL  Result Date: 10/13/2019 CLINICAL DATA:  Acute renal failure. EXAM: RENAL / URINARY TRACT ULTRASOUND COMPLETE COMPARISON:  September 07, 2013.  CT dated Nov 21, 2013 FINDINGS: Right Kidney: Renal measurements: 9.5 x 4.4 x 5.1 cm = volume: 111 mL. There is no hydronephrosis. There is increased cortical echogenicity. Left Kidney: Renal measurements: 10.7 x 5 x 4.8 cm = volume: 135 mL. There is no hydronephrosis. There is increased cortical echogenicity. Bladder: Appears normal for degree of bladder distention. Other: None. IMPRESSION: 1. No acute abnormality. 2. Increased cortical echogenicity bilaterally which can be seen in patients with medical renal disease. Electronically Signed   By: Constance Holster M.D.   On: 10/13/2019 23:28     Medications:   . sodium chloride     . aspirin EC  81 mg Oral Daily  . atorvastatin  10 mg Oral Daily  . carvedilol  6.25 mg Oral BID WC  . diltiazem  240 mg Oral Daily  . feeding supplement (NEPRO CARB STEADY)  237 mL Oral BID BM  . hydrALAZINE  37.5 mg Oral TID  . insulin aspart  0-15 Units Subcutaneous TID WC  . insulin aspart  0-5 Units Subcutaneous QHS  . pantoprazole  40 mg Oral Daily  . sodium chloride flush  3 mL Intravenous Q12H  . sodium chloride flush  3 mL Intravenous Q12H  . Warfarin - Pharmacist Dosing Inpatient   Does not apply q1600   sodium chloride, acetaminophen, ipratropium-albuterol, ondansetron **OR** ondansetron (ZOFRAN) IV, senna-docusate, sodium chloride flush, traZODone  Assessment/ Plan:  Kimberly Peterson is a 86 y.o. black  female with diastolic congestive heart failure, COPD on 2L O2, diabetes mellitus type II insulin dependent, hypertension, atrial fibrillation, obstructive sleep apnea, pacemaker, aortic valve replacement on warfarin, who was admitted to [facility] on admitted on 10/09/2019 for Bilateral pleural effusion [J90] Acute on chronic diastolic congestive heart failure (HCC) [I50.33] Chronic respiratory failure with hypoxia (HCC) [J96.11] Acute on chronic diastolic (congestive) heart failure ([place]) [I50.33]  1. Acute renal failure with hyperkalemia on chronic kidney disease stage IIIB with proteinuria: baseline creatinine of 1.1, GFR of 45 in 07/16/2019.  Chronic Kidney Disease seems to be secondary to diabetes and hypertension.  Acute renal failure most likely secondary to cardiorenal syndrome.  No IV contrast exposure. No obstruction on renal ultrasound Worsening renal function Discussed dialysis with patient.  She states she is not interested in dialysis at this time.  - Furosemide as needed.  - Lokelma  2. Hypertension: with acute exacerbation of diastolic congestive heart failure and atrial fibrillation. Furosemide given this morning Home regimen of diltiazem, carvedilol, hydralazine, and torsemide. Currently on diltiazem, carvedilol and hydralazine - discontinue hydralazine  3. Diabetes mellitus type II with chronic kidney disease: insulin dependent. Hemoglobin A1c of 6.2% on 09/15/19.   4. Anemia with chronic kidney disease: hemoglobin of 10. Normocytic.    LOS: 4 Kimberly Peterson 4/9/20212:15 PM

## 2019-10-14 NOTE — Progress Notes (Addendum)
Patient has not urinated since this morning- 1 lasix dose was given around 12 pm.     Will check bladder scan-  MD is aware  143 ml

## 2019-10-14 NOTE — Progress Notes (Signed)
ANTICOAGULATION CONSULT NOTE - Follow up [place] for Warfarin (home medication) Indication: atrial fibrillation  No Known Allergies  Patient Measurements: Height: 5\' 2"  (157.5 cm) Weight: 89.3 kg (196 lb 14.4 oz) IBW/kg (Calculated) : 50.1  Vital Signs: Temp: 97.5 F (36.4 C) (04/09 0257) Temp Source: Oral (04/09 0257) BP: 106/79 (04/09 0834) Pulse Rate: 64 (04/09 0834)  Labs: Recent Labs    10/12/19 0543 10/12/19 0543 10/12/19 1005 10/13/19 0533 10/13/19 0538 10/14/19 0746  HGB  --   --   --  10.0*  --   --   HCT  --   --   --  34.2*  --   --   PLT  --   --   --  188  --   --   LABPROT 46.6*   < > 45.4*  --  54.1* 48.4*  INR 5.0*   < > 4.9*  --  6.1* 5.3*  CREATININE 2.60*  --   --   --  3.48* 4.51*   < > = values in this interval not displayed.    Estimated Creatinine Clearance: 9 mL/min (A) (by C-G formula based on SCr of 4.51 mg/dL (H)).   Medical History: Past Medical History:  Diagnosis Date  . Anemia   . CHF (congestive heart failure) ([place])   . Chronic kidney disease   . COPD (chronic obstructive pulmonary disease) ([place])   . Diabetes mellitus without complication ([place])   . Hypertension     Assessment: Pharmacy asked to manage Warfarin per protocol in this 86 yo female on warfarin PTA for AFib.  INR 3.0 on admission. Home warfarin regimen per PTA med list: 5 mg every day except 7.5 mg on Thursdays, last dose 4/4 PTA.   4/4 INR 3.0 Warfarin 2mg  4/6 INR 3.7 holding dose 4/7 INR 5.0, repeat INR@1005  4.9, Vit K 2.5mg  PO given, warfarin held 4/8 INR 6.1, Vit K 5mg  PO given, warfarin held    Goal of Therapy:  INR 2-3 - INR goal confirmed with hospitalist  Monitor platelets by anticoagulation protocol: Yes   Plan:  4/8 INR 5.3, supratherapeutic, likely due to CHF exacerbation. Spoke to nurse, patient has not signs or symptoms of bleeding/bruising. After discussion with hospitalist, will order Vitamin K 5mg  PO x 1 dose again today.  Will hold warfarin dose for tonight again, as well. Daily INR and CBC at least every three days per policy  Pharmacy will continue to follow.   Aiana Nordquist A Keven Soucy 10/14/2019,10:27 AM

## 2019-10-15 DIAGNOSIS — I5033 Acute on chronic diastolic (congestive) heart failure: Secondary | ICD-10-CM | POA: Diagnosis not present

## 2019-10-15 DIAGNOSIS — I4811 Longstanding persistent atrial fibrillation: Secondary | ICD-10-CM | POA: Diagnosis not present

## 2019-10-15 DIAGNOSIS — I10 Essential (primary) hypertension: Secondary | ICD-10-CM | POA: Diagnosis not present

## 2019-10-15 DIAGNOSIS — I1310 Hypertensive heart and chronic kidney disease without heart failure, with stage 1 through stage 4 chronic kidney disease, or unspecified chronic kidney disease: Secondary | ICD-10-CM

## 2019-10-15 LAB — GLUCOSE, CAPILLARY
Glucose-Capillary: 205 mg/dL — ABNORMAL HIGH (ref 70–99)
Glucose-Capillary: 217 mg/dL — ABNORMAL HIGH (ref 70–99)
Glucose-Capillary: 238 mg/dL — ABNORMAL HIGH (ref 70–99)
Glucose-Capillary: 208 mg/dL — ABNORMAL HIGH (ref 70–99)

## 2019-10-15 LAB — BASIC METABOLIC PANEL
Anion gap: 15 (ref 5–15)
BUN: 94 mg/dL — ABNORMAL HIGH (ref 8–23)
CO2: 25 mmol/L (ref 22–32)
Calcium: 8.7 mg/dL — ABNORMAL LOW (ref 8.9–10.3)
Chloride: 94 mmol/L — ABNORMAL LOW (ref 98–111)
Creatinine, Ser: 5.06 mg/dL — ABNORMAL HIGH (ref 0.44–1.00)
GFR calc Af Amer: 8 mL/min — ABNORMAL LOW (ref >60)
GFR calc non Af Amer: 7 mL/min — ABNORMAL LOW (ref >60)
Glucose, Bld: 194 mg/dL — ABNORMAL HIGH (ref 70–99)
Potassium: 5.4 mmol/L — ABNORMAL HIGH (ref 3.5–5.1)
Sodium: 134 mmol/L — ABNORMAL LOW (ref 135–145)

## 2019-10-15 LAB — PROTIME-INR
INR: 5.6 (ref 0.8–1.2)
Prothrombin Time: 51 seconds — ABNORMAL HIGH (ref 11.4–15.2)

## 2019-10-15 LAB — PARATHYROID HORMONE, INTACT (NO CA): PTH: 211 pg/mL — ABNORMAL HIGH (ref 15–65)

## 2019-10-15 MED ORDER — SODIUM ZIRCONIUM CYCLOSILICATE 10 G PO PACK
10.0000 g | PACK | Freq: Two times a day (BID) | ORAL | Status: AC
  Administered 2019-10-15 (×2): 10 g via ORAL
  Filled 2019-10-15 (×2): qty 1

## 2019-10-15 MED ORDER — VITAMIN K1 10 MG/ML IJ SOLN
5.0000 mg | Freq: Once | INTRAVENOUS | Status: AC
  Administered 2019-10-15: 5 mg via INTRAVENOUS
  Filled 2019-10-15: qty 0.5

## 2019-10-15 NOTE — Progress Notes (Addendum)
MEWs of 2-  This is not acute-   Patient is SOB'  Abdominal breathing

## 2019-10-15 NOTE — Progress Notes (Signed)
ANTICOAGULATION CONSULT NOTE - Follow up [place] for Warfarin (home medication) Indication: atrial fibrillation  No Known Allergies  Patient Measurements: Height: 5\' 2"  (157.5 cm) Weight: 90.7 kg (200 lb) IBW/kg (Calculated) : 50.1  Vital Signs: BP: 107/49 (04/10 0810) Pulse Rate: 66 (04/10 0810)  Labs: Recent Labs    10/12/19 1005 10/13/19 0533 10/13/19 0538 10/14/19 0746 10/15/19 0636  HGB  --  10.0*  --   --   --   HCT  --  34.2*  --   --   --   PLT  --  188  --   --   --   LABPROT   < >  --  54.1* 48.4* 51.0*  INR   < >  --  6.1* 5.3* 5.6*  CREATININE  --   --  3.48* 4.51* 5.06*   < > = values in this interval not displayed.    Estimated Creatinine Clearance: 8 mL/min (A) (by C-G formula based on SCr of 5.06 mg/dL (H)).   Medical History: Past Medical History:  Diagnosis Date  . Anemia   . CHF (congestive heart failure) ([place])   . Chronic kidney disease   . COPD (chronic obstructive pulmonary disease) ([place])   . Diabetes mellitus without complication ([place])   . Hypertension     Assessment: Pharmacy asked to manage Warfarin per protocol in this 86 yo female on warfarin PTA for AFib.  INR 3.0 on admission. Home warfarin regimen per PTA med list: 5 mg every day except 7.5 mg on Thursdays, last dose 4/4 PTA.   4/4 INR 3.0 Warfarin 2mg  4/6 INR 3.7 holding dose 4/7 INR 5.0, repeat INR@1005  4.9, Vit K 2.5mg  PO given, warfarin held 4/8 INR 6.1, Vit K 5mg  PO given, warfarin held 4/9 INR 5.3, Vit K 5mg  po given, warfarin held   Goal of Therapy:  INR 2-3 - INR goal confirmed with hospitalist  Monitor platelets by anticoagulation protocol: Yes   Plan:  4/10 INR 5.6, supratherapeutic, likely due to CHF exacerbation.  Renal function continues to worsen.   Spoke to nurse, patient has not signs or symptoms of bleeding/bruising.    Will give IV vitamin K 5mg  x 1.  Will hold warfarin dose for tonight again, as well.  Daily INR daily and CBC at  least every three days per policy  Pharmacy will continue to follow.   Lu Duffel, PharmD, BCPS Clinical Pharmacist 10/15/2019 8:47 AM

## 2019-10-15 NOTE — Progress Notes (Addendum)
Patient BP dropped to 88/53- Paged MD  MD aware-  MAP still good 65 - will continue to monitor Assessed Pt-   Asymptomatic (denies CP, Lightheadedness/dizziness; and worsening SOB)  Patient has not urinated all day.  Bladder scan - 200 ml at 1426

## 2019-10-15 NOTE — Progress Notes (Addendum)
MEWs of 3-  D/t RR, LOC, and BP Not an acute - MD is aware

## 2019-10-15 NOTE — Progress Notes (Signed)
CRITICAL VALUE ALERT  Critical Value:  5.6 INR  Date & Time Notied:  10/15/2019 @ 8:22 am  Provider Notified: Messaged MD

## 2019-10-15 NOTE — Progress Notes (Signed)
PROGRESS NOTE    Kimberly Peterson   [id number]  DOB: 08-04-1929  PCP: [name], MD    DOA: 10/09/2019 LOS: 5    Brief Narrative   Kimberly Peterson is an 86 y.o. femalewith medical history significant fordiastolic CHF, CKD 3a, COPD, DM 2, HTN, chronic respiratory failure on 2 L/min home oxygen, chronic A. fib on Coumadin, OSA on CPAP and history of pacemaker and aortic valve replacement, hospitalized from 09/15/2019 to 09/22/2019 with CHF exacerbation who presented  to the ED on the night of 10/09/19  with the of shortness of breath, orthopnea and worsening lower extremity edema.  Admitted for management of acute on chronic diastolic CHF.  Patient developed worsening renal function and decreased urine output.  Nephrology consulted.   Assessment & Plan   Active Problems:   Benign essential HTN   Chronic obstructive pulmonary disease (HCC)   Atrial fibrillation (HCC)   Type 2 diabetes with peripheral circulatory disorder, controlled (HCC)   Acute on chronic diastolic heart failure (HCC)   Elevated troponin   CKD (chronic kidney disease) stage 3, GFR 30-59 ml/min   Chronic respiratory failure with hypoxia (HCC)   H/O aortic valve replacement   Acute on chronic diastolic (congestive) heart failure (HCC)  Acute on chronic diastolic heart failure Severe pulmonary hypertension Patient with recurrent admissions for decompensated CHF.  Had just been discharged 2 weeks prior for same.  Again presented with dyspnea at rest, pedal edema.  Echocardiogram on 10/11/2019 showed EF greater than 55%, indeterminate diastolic function.  ReDS clip reading on 4/7 was 40, but patient has mod/large R pleural effusion and small one on left. -Hold IV Lasix due to AKI -Continue Coreg.  --Not on ACE/ARB likely secondary to CKD -Daily weights, intake and output monitoring, salt and fluid restriction --Follow-up with cardiology within 1 week of discharge (Dr. [name])  AKI superimposed on CKD stage IIIa  - AKI secondary to cardiorenal syndrome. Creatinine rising now up to 5 (baseline appears to 1.1-1.3).  Little to no urine output past 2 days seen on bladder scans.  Renal ultrasound showed chronic changes of CKD but nothing acute. --Trial of Lasix infusion -monitor BMP --nephrology consulted  --patient not interested in dialysis --Patient agreeable to palliative consult for goals of care discussion  Hyperkalemia - due to renal failure --Lokelma today --BMP in AM  Nausea -resolved -Antiemetics as needed  Supratherapeutic INR -INR still over 5 despite oral vitamin K for past 3 days.   --will try IV vitamin K today to see if any improvement  --pharmacy following --hold warfarin --recheck INR tomorrow  Atrial fibrillation ([place]) -Continue Coumadin when INR comes down --Continue Coreg -Pharmacy to adjust Coumadin based on INR   Mildly elevated troponin -Troponins flat and likely related to demand ischemia from CHF exacerbation.  Chronic respiratory failure with hypoxia (HCC) Continue oxygen via nasal cannula.  Maintain O2 sat greater than 90%.  S/p pacemaker and aortic valve replacement -No acute issues. Patient gets her routine pacemaker checks -Follows with [name]  Benign essential HTN -continue Cartia, carvedilol and hydralazine  Chronic obstructive pulmonary disease ([place]) -DuoNebs as needed  Type 2 diabetes with peripheral circulatory disorder, controlled ([place]) -Sliding scale insulin for now pending med rec  OSA Continue CPAP at night.   Morbid obesity: Body mass index is 36.58 kg/m.  Contributes to shortness of breath, overall complicating factor.  DVT prophylaxis: on warfarin Diet:  Diet Orders (From admission, onward)  Start     Ordered   10/10/19 0132  Diet heart healthy/carb modified Room service appropriate? Yes; Fluid consistency: Thin  Diet effective now    Question Answer Comment  Diet-HS Snack? Nothing   Room  service appropriate? Yes   Fluid consistency: Thin      10/10/19 0136            Code Status: Full Code    Subjective 10/15/19    Patient seen up in chair this morning drowsy but awoke and was interactive.  No acute events reported overnight.  She reports some poor appetite.  Reports her breathing is pretty good today.  Denies chest pain or shortness of breath, fevers or chills, nausea vomiting or diarrhea.    We discussed her worsening renal function and CHF and potential need for dialysis soon.  Patient again indicated that she does not want dialysis.  We discussed bringing palliative care on board and she is agreeable.  We discussed her CODE STATUS today and she indicated clearly that she would not want CPR or defibrillation or intubation in the event of cardiac or respiratory arrest.  CODE STATUS changed to DNR.   Disposition Plan & Communication   Dispo & Barriers: To be determined as patient is likely getting progressively weak, PT to reevaluate.  Palliative discussion pending for goals of care.   Patient with worsening AKI on CKD due to cardiorenal syndrome, continues to require inpatient care at this time. Coming from: Home Exp d/c date: TBD, 4/12? Medically stable for d/c?  No  Family Communication: None at bedside during encounter, will attempt to call   Consults, Procedures, Significant Events   Consultants:   Nephrology  Pharmacy, warfarin dosing  Procedures:   None  Antimicrobials:   None   Objective   Vitals:   10/14/19 1639 10/15/19 0612 10/15/19 0616 10/15/19 0810  BP: (!) 119/54  113/61 (!) 107/49  Pulse: 65  65 66  Resp:      Temp: 98.7 F (37.1 C)     TempSrc: Oral     SpO2: 97%  97% 97%  Weight:  90.7 kg    Height:        Intake/Output Summary (Last 24 hours) at 10/15/2019 0815 Last data filed at 10/15/2019 0044 Gross per 24 hour  Intake 275 ml  Output 0 ml  Net 275 ml   Filed Weights   10/12/19 1246 10/14/19 0257 10/15/19 0612    Weight: 91.7 kg 89.3 kg 90.7 kg    Physical Exam:  General exam: awake, alert, no acute distress, obese HEENT: moist mucus membranes, hearing grossly normal  Respiratory system: basilar crackles, increased respiratory effort with abdominal breathing, on 5 L/min nasal cannula oxygen. Cardiovascular system: normal [id number], RRR, 2/6 systolic murmur, right greater than left lower extremity pitting edema more tense today.   Central nervous system: A&O x3. no gross focal neurologic deficits, normal speech Extremities: moves all, normal tone Psychiatry: Depressed mood, congruent affect, judgement and insight appear normal  Labs   Data Reviewed: I have personally reviewed following labs and imaging studies  CBC: Recent Labs  Lab 10/09/19 2348 10/13/19 0533  WBC 5.5 6.0  NEUTROABS 3.4  --   HGB 10.4* 10.0*  HCT 35.2* 34.2*  MCV 80.9 83.2  PLT 221 0000000   Basic Metabolic Panel: Recent Labs  Lab 10/11/19 0856 10/12/19 0543 10/13/19 0538 10/14/19 0746 10/15/19 0636  NA 140 134* 137 134* 134*  K 4.4 4.8 5.2* 5.6* 5.4*  CL 99 96* 96* 93* 94*  CO2 26 27 29 26 25   GLUCOSE 211* 246* 196* 184* 194*  BUN 37* 55* 65* 81* 94*  CREATININE 1.70* 2.60* 3.48* 4.51* 5.06*  CALCIUM 9.0 8.8* 8.7* 8.9 8.7*  MG 2.5* 2.8*  --   --   --    GFR: Estimated Creatinine Clearance: 8 mL/min (A) (by C-G formula based on SCr of 5.06 mg/dL (H)). Liver Function Tests: Recent Labs  Lab 10/09/19 2348  AST 24  ALT 12  ALKPHOS 72  BILITOT 0.8  PROT 7.5  ALBUMIN 3.5   No results for input(s): LIPASE, AMYLASE in the last 168 hours. No results for input(s): AMMONIA in the last 168 hours. Coagulation Profile: Recent Labs  Lab 10/11/19 0856 10/12/19 0543 10/12/19 1005 2019-10-20 0538 10/14/19 0746  INR 3.7* 5.0* 4.9* 6.1* 5.3*   Cardiac Enzymes: No results for input(s): CKTOTAL, CKMB, CKMBINDEX, TROPONINI in the last 168 hours. BNP (last 3 results) No results for input(s): PROBNP in the last 8760  hours. HbA1C: No results for input(s): HGBA1C in the last 72 hours. CBG: Recent Labs  Lab 10/14/19 0750 10/14/19 1142 10/14/19 1657 10/14/19 2110 10/15/19 0812  GLUCAP 194* 154* 207* 212* 205*   Lipid Profile: No results for input(s): CHOL, HDL, LDLCALC, TRIG, CHOLHDL, LDLDIRECT in the last 72 hours. Thyroid Function Tests: No results for input(s): TSH, T4TOTAL, FREET4, T3FREE, THYROIDAB in the last 72 hours. Anemia Panel: No results for input(s): VITAMINB12, FOLATE, FERRITIN, TIBC, IRON, RETICCTPCT in the last 72 hours. Sepsis Labs: No results for input(s): PROCALCITON, LATICACIDVEN in the last 168 hours.  Recent Results (from the past 240 hour(s))  SARS CORONAVIRUS 2 (TAT 6-24 HRS) Nasopharyngeal Nasopharyngeal Swab     Status: None   Collection Time: 10/09/19 11:48 PM   Specimen: Nasopharyngeal Swab  Result Value Ref Range Status   SARS Coronavirus 2 NEGATIVE NEGATIVE Final    Comment: (NOTE) SARS-CoV-2 target nucleic acids are NOT DETECTED. The SARS-CoV-2 RNA is generally detectable in upper and lower respiratory specimens during the acute phase of infection. Negative results do not preclude SARS-CoV-2 infection, do not rule out co-infections with other pathogens, and should not be used as the sole basis for treatment or other patient management decisions. Negative results must be combined with clinical observations, patient history, and epidemiological information. The expected result is Negative. Fact Sheet for Patients: SugarRoll.be Fact Sheet for Healthcare Providers: https://www.woods-mathews.com/ This test is not yet approved or cleared by the [place] FDA and  has been authorized for detection and/or diagnosis of SARS-CoV-2 by FDA under an Emergency Use Authorization (EUA). This EUA will remain  in effect (meaning this test can be used) for the duration of the COVID-19 declaration under Section 56 4(b)(1) of the  Act, 21 U.S.C. section 360bbb-3(b)(1), unless the authorization is terminated or revoked sooner. Performed at [facility], [place] [address] [place], [place] [address]       Imaging Studies   US RENAL  Result Date: Oct 20, 2019 CLINICAL DATA:  Acute renal failure. EXAM: RENAL / URINARY TRACT ULTRASOUND COMPLETE COMPARISON:  September 07, 2013.  CT dated Nov 21, 2013 FINDINGS: Right Kidney: Renal measurements: 9.5 x 4.4 x 5.1 cm = volume: 111 mL. There is no hydronephrosis. There is increased cortical echogenicity. Left Kidney: Renal measurements: 10.7 x 5 x 4.8 cm = volume: 135 mL. There is no hydronephrosis. There is increased cortical echogenicity. Bladder: Appears normal for degree of bladder distention. Other: None. IMPRESSION:  1. No acute abnormality. 2. Increased cortical echogenicity bilaterally which can be seen in patients with medical renal disease. Electronically Signed   By: Constance Holster M.D.   On: 10/13/2019 23:28     Medications   Scheduled Meds: . aspirin EC  81 mg Oral Daily  . atorvastatin  10 mg Oral Daily  . carvedilol  6.25 mg Oral BID WC  . diltiazem  240 mg Oral Daily  . feeding supplement (NEPRO CARB STEADY)  237 mL Oral BID BM  . insulin aspart  0-15 Units Subcutaneous TID WC  . insulin aspart  0-5 Units Subcutaneous QHS  . pantoprazole  40 mg Oral Daily  . sodium chloride flush  3 mL Intravenous Q12H  . sodium chloride flush  3 mL Intravenous Q12H  . Warfarin - Pharmacist Dosing Inpatient   Does not apply q1600   Continuous Infusions: . sodium chloride         LOS: 5 days    Time spent: 35 minutes    Ezekiel Slocumb, DO [facility]   If 7PM-7AM, please contact night-coverage [email or web address] 10/15/2019, 8:15 AM

## 2019-10-15 NOTE — Progress Notes (Signed)
[facility]  ROUNDING NOTE   Subjective:   S Creatinine worse Sitting up in the chair + SOB + LE edema Poor appetite   Objective:  Vital signs in last 24 hours:  Temp:  [98.7 F (37.1 C)] 98.7 F (37.1 C) (04/09 1639) Pulse Rate:  [64-66] 64 (04/10 1219) Resp:  [21] 21 (04/10 0810) BP: (100-119)/(47-61) 100/47 (04/10 1219) SpO2:  [97 %-98 %] 98 % (04/10 1219) Weight:  [90.7 kg] 90.7 kg (04/10 0612)  Weight change: 1.406 kg Filed Weights   10/12/19 1246 10/14/19 0257 10/15/19 0612  Weight: 91.7 kg 89.3 kg 90.7 kg    Intake/Output: I/O last 3 completed shifts: In: 275 [P.O.:275] Out: 0    Intake/Output this shift:  Total I/O In: 480 [P.O.:480] Out: -   Physical Exam: General: NAD, seated in chair  Head: Normocephalic, atraumatic. Moist oral mucosal membranes  Eyes: +blind  Neck: Supple,    Lungs:  Bilateral crackles  Heart: Bradycardia,   Abdomen:  Soft, nontender, obese  Extremities: + peripheral edema.  Neurologic: Nonfocal, moving all four extremities  Skin: No lesions        Basic Metabolic Panel: Recent Labs  Lab 10/11/19 0856 10/11/19 0856 10/12/19 0543 10/12/19 0543 10/13/19 0538 10/14/19 0746 10/15/19 0636  NA 140  --  134*  --  137 134* 134*  K 4.4  --  4.8  --  5.2* 5.6* 5.4*  CL 99  --  96*  --  96* 93* 94*  CO2 26  --  27  --  29 26 25   GLUCOSE 211*  --  246*  --  196* 184* 194*  BUN 37*  --  55*  --  65* 81* 94*  CREATININE 1.70*  --  2.60*  --  3.48* 4.51* 5.06*  CALCIUM 9.0   < > 8.8*   < > 8.7* 8.9 8.7*  MG 2.5*  --  2.8*  --   --   --   --    < > = values in this interval not displayed.    Liver Function Tests: Recent Labs  Lab 10/09/19 2348  AST 24  ALT 12  ALKPHOS 72  BILITOT 0.8  PROT 7.5  ALBUMIN 3.5   No results for input(s): LIPASE, AMYLASE in the last 168 hours. No results for input(s): AMMONIA in the last 168 hours.  CBC: Recent Labs  Lab 10/09/19 2348 10/13/19 0533  WBC 5.5 6.0   NEUTROABS 3.4  --   HGB 10.4* 10.0*  HCT 35.2* 34.2*  MCV 80.9 83.2  PLT 221 188    Cardiac Enzymes: No results for input(s): CKTOTAL, CKMB, CKMBINDEX, TROPONINI in the last 168 hours.  BNP: Invalid input(s): POCBNP  CBG: Recent Labs  Lab 10/14/19 1142 10/14/19 1657 10/14/19 2110 10/15/19 0812 10/15/19 1220  GLUCAP 154* 207* 212* 205* 217*    Microbiology: Results for orders placed or performed during the hospital encounter of 10/09/19  SARS CORONAVIRUS 2 (TAT 6-24 HRS) Nasopharyngeal Nasopharyngeal Swab     Status: None   Collection Time: 10/09/19 11:48 PM   Specimen: Nasopharyngeal Swab  Result Value Ref Range Status   SARS Coronavirus 2 NEGATIVE NEGATIVE Final    Comment: (NOTE) SARS-CoV-2 target nucleic acids are NOT DETECTED. The SARS-CoV-2 RNA is generally detectable in upper and lower respiratory specimens during the acute phase of infection. Negative results do not preclude SARS-CoV-2 infection, do not rule out co-infections with other pathogens, and should not be used as  the sole basis for treatment or other patient management decisions. Negative results must be combined with clinical observations, patient history, and epidemiological information. The expected result is Negative. Fact Sheet for Patients: SugarRoll.be Fact Sheet for Healthcare Providers: https://www.woods-mathews.com/ This test is not yet approved or cleared by the [place] FDA and  has been authorized for detection and/or diagnosis of SARS-CoV-2 by FDA under an Emergency Use Authorization (EUA). This EUA will remain  in effect (meaning this test can be used) for the duration of the COVID-19 declaration under Section 56 4(b)(1) of the Act, 21 U.S.C. section 360bbb-3(b)(1), unless the authorization is terminated or revoked sooner. Performed at [facility], [place] [address], [place], [place] [address]     Coagulation Studies: Recent  Labs    10/13/19 0538 10/14/19 0746 10/15/19 0636  LABPROT 54.1* 48.4* 51.0*  INR 6.1* 5.3* 5.6*    Urinalysis: No results for input(s): COLORURINE, LABSPEC, PHURINE, GLUCOSEU, HGBUR, BILIRUBINUR, KETONESUR, PROTEINUR, UROBILINOGEN, NITRITE, LEUKOCYTESUR in the last 72 hours.  Invalid input(s): APPERANCEUR    Imaging: US RENAL  Result Date: 10/13/2019 CLINICAL DATA:  Acute renal failure. EXAM: RENAL / URINARY TRACT ULTRASOUND COMPLETE COMPARISON:  September 07, 2013.  CT dated Nov 21, 2013 FINDINGS: Right Kidney: Renal measurements: 9.5 x 4.4 x 5.1 cm = volume: 111 mL. There is no hydronephrosis. There is increased cortical echogenicity. Left Kidney: Renal measurements: 10.7 x 5 x 4.8 cm = volume: 135 mL. There is no hydronephrosis. There is increased cortical echogenicity. Bladder: Appears normal for degree of bladder distention. Other: None. IMPRESSION: 1. No acute abnormality. 2. Increased cortical echogenicity bilaterally which can be seen in patients with medical renal disease. Electronically Signed   By: Constance Holster M.D.   On: 10/13/2019 23:28     Medications:   . sodium chloride    . phytonadione (VITAMIN K) IV     . aspirin EC  81 mg Oral Daily  . atorvastatin  10 mg Oral Daily  . carvedilol  6.25 mg Oral BID WC  . diltiazem  240 mg Oral Daily  . feeding supplement (NEPRO CARB STEADY)  237 mL Oral BID BM  . insulin aspart  0-15 Units Subcutaneous TID WC  . insulin aspart  0-5 Units Subcutaneous QHS  . pantoprazole  40 mg Oral Daily  . sodium chloride flush  3 mL Intravenous Q12H  . sodium chloride flush  3 mL Intravenous Q12H  . sodium zirconium cyclosilicate  10 g Oral BID  . Warfarin - Pharmacist Dosing Inpatient   Does not apply q1600   sodium chloride, acetaminophen, ipratropium-albuterol, ondansetron **OR** ondansetron (ZOFRAN) IV, senna-docusate, sodium chloride flush, traZODone  Assessment/ Plan:  Kimberly Peterson is a 86 y.o. black female with diastolic  congestive heart failure, COPD on 2L O2, diabetes mellitus type II insulin dependent, hypertension, atrial fibrillation, obstructive sleep apnea, pacemaker, aortic valve replacement on warfarin, who was admitted to [facility] on admitted on 10/09/2019 for Bilateral pleural effusion [J90] Acute on chronic diastolic congestive heart failure (HCC) [I50.33] Chronic respiratory failure with hypoxia (HCC) [J96.11] Acute on chronic diastolic (congestive) heart failure ([place]) [I50.33]  #. Acute renal failure with hyperkalemia on chronic kidney disease stage IIIB with proteinuria: baseline creatinine of 1.1, GFR of 45 in 07/16/2019.  Chronic Kidney Disease seems to be secondary to diabetes and hypertension.  Acute renal failure most likely secondary to cardiorenal syndrome.  No IV contrast exposure. No obstruction on renal ultrasound Worsening renal function Discussed dialysis  with patient. She states she is not interested in dialysis at this time. Dialysis would be difficult for patient due to advanced age and multiple co morbidities - trial of Furosemide  infusion - [place] - palliative care to clarify goals of care  #. Diabetes mellitus type II with chronic kidney disease:  Lab Results  Component Value Date   HGBA1C 6.2 (H) 09/15/2019  insulin dependent.    #. Anemia with chronic kidney disease:  Lab Results  Component Value Date   HGB 10.0 (L) 10/13/2019   # LE Edema and fluid overload Trial of lasix infusion   LOS: 5 Apple Dearmas 4/10/202112:29 PM

## 2019-10-15 NOTE — Progress Notes (Signed)
MEWs of 3-  BP dropped  Patient is not urinating- concern for giving fluid.    Notified MD- will continue to monitor since MAP is ok.

## 2019-10-16 DIAGNOSIS — I5033 Acute on chronic diastolic (congestive) heart failure: Secondary | ICD-10-CM | POA: Diagnosis not present

## 2019-10-16 DIAGNOSIS — I1310 Hypertensive heart and chronic kidney disease without heart failure, with stage 1 through stage 4 chronic kidney disease, or unspecified chronic kidney disease: Secondary | ICD-10-CM

## 2019-10-16 LAB — BASIC METABOLIC PANEL
Anion gap: 16 — ABNORMAL HIGH (ref 5–15)
BUN: 102 mg/dL — ABNORMAL HIGH (ref 8–23)
CO2: 26 mmol/L (ref 22–32)
Calcium: 8.6 mg/dL — ABNORMAL LOW (ref 8.9–10.3)
Chloride: 91 mmol/L — ABNORMAL LOW (ref 98–111)
Creatinine, Ser: 5.74 mg/dL — ABNORMAL HIGH (ref 0.44–1.00)
GFR calc Af Amer: 7 mL/min — ABNORMAL LOW (ref >60)
GFR calc non Af Amer: 6 mL/min — ABNORMAL LOW (ref >60)
Glucose, Bld: 204 mg/dL — ABNORMAL HIGH (ref 70–99)
Potassium: 4.9 mmol/L (ref 3.5–5.1)
Sodium: 133 mmol/L — ABNORMAL LOW (ref 135–145)

## 2019-10-16 LAB — GLUCOSE, CAPILLARY
Glucose-Capillary: 184 mg/dL — ABNORMAL HIGH (ref 70–99)
Glucose-Capillary: 196 mg/dL — ABNORMAL HIGH (ref 70–99)
Glucose-Capillary: 239 mg/dL — ABNORMAL HIGH (ref 70–99)
Glucose-Capillary: 172 mg/dL — ABNORMAL HIGH (ref 70–99)
Glucose-Capillary: 173 mg/dL — ABNORMAL HIGH (ref 70–99)

## 2019-10-16 LAB — CBC
HCT: 33.4 % — ABNORMAL LOW (ref 36.0–46.0)
Hemoglobin: 10 g/dL — ABNORMAL LOW (ref 12.0–15.0)
MCH: 23.8 pg — ABNORMAL LOW (ref 26.0–34.0)
MCHC: 29.9 g/dL — ABNORMAL LOW (ref 30.0–36.0)
MCV: 79.5 fL — ABNORMAL LOW (ref 80.0–100.0)
Platelets: 163 10*3/uL (ref 150–400)
RBC: 4.2 MIL/uL (ref 3.87–5.11)
RDW: 18.5 % — ABNORMAL HIGH (ref 11.5–15.5)
WBC: 8.4 10*3/uL (ref 4.0–10.5)
nRBC: 3.1 % — ABNORMAL HIGH (ref 0.0–0.2)

## 2019-10-16 LAB — PROTIME-INR
INR: 1.8 — ABNORMAL HIGH (ref 0.8–1.2)
Prothrombin Time: 21.1 seconds — ABNORMAL HIGH (ref 11.4–15.2)

## 2019-10-16 MED ORDER — FUROSEMIDE 10 MG/ML IJ SOLN
60.0000 mg | Freq: Once | INTRAMUSCULAR | Status: AC
  Administered 2019-10-16: 60 mg via INTRAVENOUS
  Filled 2019-10-16: qty 6

## 2019-10-16 MED ORDER — WARFARIN SODIUM 5 MG PO TABS
5.0000 mg | ORAL_TABLET | Freq: Once | ORAL | Status: AC
  Administered 2019-10-16: 5 mg via ORAL
  Filled 2019-10-16: qty 1

## 2019-10-16 NOTE — Progress Notes (Signed)
Patient remains in chair. On nasal 5 liters. o2 satruration 94% awakes when I call her name. Questioned patient about wearing cpap. She said no she was not gonna wear cpap tonight. She did not wear last night.will continue to monitor

## 2019-10-16 NOTE — Progress Notes (Signed)
ANTICOAGULATION CONSULT NOTE - Follow up [facility] for Warfarin (home medication) Indication: atrial fibrillation  No Known Allergies  Patient Measurements: Height: 5\' 2"  (157.5 cm) Weight: 91.8 kg (202 lb 6.4 oz) IBW/kg (Calculated) : 50.1  Vital Signs: Temp: 98.3 F (36.8 C) (04/11 0413) BP: 100/56 (04/11 0413) Pulse Rate: 64 (04/11 0413)  Labs: Recent Labs    10/14/19 0746 10/15/19 0636 10/16/19 0533  HGB  --   --  10.0*  HCT  --   --  33.4*  PLT  --   --  163  LABPROT 48.4* 51.0* 21.1*  INR 5.3* 5.6* 1.8*  CREATININE 4.51* 5.06* 5.74*    Estimated Creatinine Clearance: 7.1 mL/min (A) (by C-G formula based on SCr of 5.74 mg/dL (H)).   Medical History: Past Medical History:  Diagnosis Date  . Anemia   . CHF (congestive heart failure) ([place])   . Chronic kidney disease   . COPD (chronic obstructive pulmonary disease) ([place])   . Diabetes mellitus without complication ([place])   . Hypertension     Assessment: Pharmacy asked to manage Warfarin per protocol in this 86 yo female on warfarin PTA for AFib.  INR 3.0 on admission. Home warfarin regimen per PTA med list: 5 mg every day except 7.5 mg on Thursdays, last dose 4/4 PTA.  Renal function continues to worsen  4/4 INR 3.0 Warfarin 2mg  4/6 INR 3.7 holding dose 4/7 INR 5.0, repeat INR@1005  4.9, Vit K 2.5mg  PO given, warfarin held 4/8 INR 6.1, Vit K 5mg  PO given, warfarin held 4/9 INR 5.3, Vit K 5mg  po given, warfarin held 4/10 INR 5.6, Vit K 5mg  IV given, warfarin held 4/11 INR 1.8   Goal of Therapy:  INR 2-3 - INR goal confirmed with hospitalist  Monitor platelets by anticoagulation protocol: Yes   Plan:  4/11 INR 1.8, subtherapeutic, will resume warfarin 5mg  this evening  Spoke to nurse, patient has not signs or symptoms of bleeding/bruising.    Daily INR daily and CBC at least every three days per policy  Pharmacy will continue to follow.   Lu Duffel, PharmD, BCPS Clinical  Pharmacist 10/16/2019 7:50 AM

## 2019-10-16 NOTE — Evaluation (Signed)
Occupational Therapy Evaluation Patient Details Name: Kimberly Peterson MRN: [id number] DOB: 12-10-1929 Today's Date: 10/16/2019    History of Present Illness Kimberly Peterson is a 86 y.o. female with medical history significant for diastolic CHF, CKD 3, COPD, DM 2, HTN, chronic respiratory failure on home O2 2 to 3 L, chronic A. fib on Coumadin, OSA on CPAP and history of pacemaker valve replacement, hospitalized from 09/15/2019 to 09/22/2019 with CHF exacerbation who presents again to the emergency room with the of shortness of breath, orthopnea and worsening lower extremity edema she denies chest pain, cough fever or chills.   Clinical Impression   Kimberly Peterson was seen for OT evaluation this date. Prior to hospital admission, pt lives with daughter and requires assist for I/ADLs including dressing, bathing, and SETUP for grooming/meals. Pt presents to acute OT demonstrating impaired ADL performance and functional mobility 2/2 functional strength/ROM/endurance deficits, nausea, BLE edema, abdominal pain, and decreased LB access. Pt currently requires SETUP face washing and MOD A for self-feeding seated in chair. Pt deferred OOC mobility this date 2/2 fatigue - pt's eyes closed t/o session and per conversation c RN pt has remained somnolent while upright in chair this AM. Pt stays in chair at home and during this hospital stay 2/2 orthopnea. Pt demonstrated recall of previous TherEx (seated marches and overhead presses) and pt/caregivers agreeable to continue exercises t/o day. Pt would benefit from skilled OT to address noted impairments and functional limitations (see below for any additional details) in order to maximize safety and independence while minimizing falls risk and caregiver burden. Upon hospital discharge, recommend STR to maximize pt safety and return to PLOF.     Follow Up Recommendations  SNF    Equipment Recommendations       Recommendations for Other Services       Precautions /  Restrictions Precautions Precautions: Fall Restrictions Weight Bearing Restrictions: No      Mobility Bed Mobility               General bed mobility comments: Pt was seated in recliner pre/post session - pt has not been in bed at all per RN  Transfers                 General transfer comment: Not tested this date    Balance                                           ADL either performed or assessed with clinical judgement   ADL Overall ADL's : Needs assistance/impaired                                       General ADL Comments: MAX A self-feeding, limited by nausea. SETUP simulated grooming seated in chair. Pt deferred OOC mobility this date 2/2 fatigue (pt eyes closed t/o session)     Vision         Perception     Praxis      Pertinent Vitals/Pain Pain Assessment: No/denies pain     Hand Dominance Right   Extremity/Trunk Assessment Upper Extremity Assessment Upper Extremity Assessment: Generalized weakness   Lower Extremity Assessment Lower Extremity Assessment: Generalized weakness   Cervical / Trunk Assessment Cervical / Trunk Assessment: Kyphotic   Communication Communication Communication: No difficulties  Cognition Arousal/Alertness: Lethargic(Responds to voice/touch, eyes closed t/o session) Behavior During Therapy: WFL for tasks assessed/performed Overall Cognitive Status: Within Functional Limits for tasks assessed                                     General Comments  BLE edema noted     Exercises Exercises: Other exercises Other Exercises Other Exercises: Pt and caregivers educated re: OT role, DME recommendations, edema management, and d/c recommendations Other Exercises: Self-feeding, simulated grooming, therex seated in chair ( marches, over head raises)   Shoulder Instructions      Home Living Family/patient expects to be discharged to:: Private residence Living  Arrangements: Children Available Help at Discharge: Family;Available 24 hours/day Type of Home: House Home Access: Stairs to enter [employer] of Steps: 1 step   Home Layout: One level     Bathroom Shower/Tub: Occupational psychologist: Handicapped height     Home Equipment: Environmental consultant - 4 wheels;Shower seat - built in;Cane - single point;Toilet riser;Bedside commode          Prior Functioning/Environment Level of Independence: Needs assistance  Gait / Transfers Assistance Needed: Ambulates with rollator, spends most of time in recliner ADL's / Homemaking Assistance Needed: Daughter assists c I/ADLs including toilet t/f to [facility], bathing/dressing, and meals.    Comments: On 2-3L O2 at baseline. Dtr reports sleeps and sits in recliner majority of day - pt dislikes being flat 2/2 orthopnea. Has [place] and PT currently per dtr.         OT Problem List: Decreased strength;Decreased range of motion;Decreased activity tolerance      OT Treatment/Interventions: Self-care/ADL training;Therapeutic exercise;Energy conservation;DME and/or AE instruction;Therapeutic activities;Visual/perceptual remediation/compensation;Patient/family education;Cognitive remediation/compensation;Balance training    OT Goals(Current goals can be found in the care plan section) Acute Rehab OT Goals Patient Stated Goal: go home OT Goal Formulation: With patient Time For Goal Achievement: 10/30/19 Potential to Achieve Goals: Fair ADL Goals Pt Will Perform Grooming: with caregiver independent in assisting;sitting;with modified independence Pt Will Transfer to Toilet: squat pivot transfer;bedside commode;with mod assist(c LRAD PRN) Pt/caregiver will Perform Home Exercise Program: Increased strength;Both right and left upper extremity  OT Frequency: Min 2X/week   Barriers to D/C: Inaccessible home environment          Co-evaluation              AM-PAC OT "6 Clicks" Daily Activity      Outcome Measure Help from another person eating meals?: A Little Help from another person taking care of personal grooming?: A Little Help from another person toileting, which includes using toliet, bedpan, or urinal?: A Lot Help from another person bathing (including washing, rinsing, drying)?: A Lot Help from another person to put on and taking off regular upper body clothing?: A Little Help from another person to put on and taking off regular lower body clothing?: A Lot 6 Click Score: 15   End of Session Equipment Utilized During Treatment: Oxygen(5L humidified [place])  Activity Tolerance: Patient tolerated treatment well;Patient limited by fatigue Patient left: in chair;with call bell/phone within reach;with family/visitor present  OT Visit Diagnosis: Unsteadiness on feet (R26.81);Other abnormalities of gait and mobility (R26.89)                Time: 1310-1330 OT Time Calculation (min): 20 min Charges:  OT General Charges $OT Visit: 1 Visit OT Evaluation $OT Eval Low Complexity:  1 Low OT Treatments $Self Care/Home Management : 8-22 mins  Dessie Coma, M.S. OTR/L  10/16/19, 2:58 PM

## 2019-10-16 NOTE — Progress Notes (Signed)
Physical Therapy Evaluation Patient Details Name: Kimberly Peterson MRN: [id number] DOB: 14-Jun-1930 Today's Date: 10/16/2019   History of Present Illness  Kimberly Peterson is a 86 y.o. female with medical history significant for diastolic CHF, CKD 3, COPD, DM 2, HTN, chronic respiratory failure on home O2 2 to 3 L, chronic A. fib on Coumadin, OSA on CPAP and history of pacemaker valve replacement, hospitalized from 09/15/2019 to 09/22/2019 with CHF exacerbation who presents again to the emergency room with the of shortness of breath, orthopnea and worsening lower extremity edema she denies chest pain, cough fever or chills.  Clinical Impression  Patient is sitting in recliner and agrees to PT eval. She reports that she doesn't feel well and doesn't feel like she can transfer to bed due to fatigue, and she wants to stay in the recliner. She reports no pain symptoms. Her daughters are present and report that she has needed more assistance with mobility recently but was able to walk short distances with RW at home. She has poor hip and knee strength and is fatigued today and unable to perform sit to stand transfer. She will benefit from skilled PT to improve mobility and strength. PT recommended SNF to daughters as an option for DC but the daughter is not wanting to choose this and would rather take her mom home.  Follow Up Recommendations SNF    Equipment Recommendations  Rolling walker with 5" wheels    Recommendations for Other Services       Precautions / Restrictions Precautions Precautions: Fall Restrictions Weight Bearing Restrictions: No      Mobility  Bed Mobility               General bed mobility comments: (Patient refused to get in and out of bed)  Transfers Overall transfer level: Needs assistance Equipment used: Rolling walker (2 wheeled) Transfers: Sit to/from Stand Sit to Stand: Total assist         General transfer comment: (Patient is not able to stand up due to  weakness )  Ambulation/Gait                Stairs            Wheelchair Mobility    Modified Rankin (Stroke Patients Only)       Balance                                             Pertinent Vitals/Pain Pain Assessment: No/denies pain    Home Living Family/patient expects to be discharged to:: Private residence Living Arrangements: Children Available Help at Discharge: Family;Available 24 hours/day Type of Home: House Home Access: Stairs to enter   [employer] of Steps: 1 step Home Layout: One level Home Equipment: Walker - 4 wheels;Shower seat - built in;Cane - single point;Toilet riser;Bedside commode      Prior Function Level of Independence: Needs assistance   Gait / Transfers Assistance Needed: Ambulates with rollator, spends most of time in recliner  ADL's / Homemaking Assistance Needed: Daughter assists c I/ADLs including toilet t/f to [facility], bathing/dressing, and meals.   Comments: On 2-3L O2 at baseline. Dtr reports sleeps and sits in recliner majority of day - pt dislikes being flat 2/2 orthopnea. Has [place] and PT currently per dtr.      Hand Dominance   Dominant Hand: Right  Extremity/Trunk Assessment   Upper Extremity Assessment Upper Extremity Assessment: Generalized weakness    Lower Extremity Assessment Lower Extremity Assessment: RLE deficits/detail;LLE deficits/detail RLE Deficits / Details: (2/5 hip flex, knee extension, ankle DF) RLE Coordination: (2/5 hip flex, knee extension, ankle DF)    Cervical / Trunk Assessment Cervical / Trunk Assessment: Kyphotic  Communication   Communication: No difficulties  Cognition Arousal/Alertness: Lethargic(Responds to voice/touch, eyes closed t/o session) Behavior During Therapy: WFL for tasks assessed/performed Overall Cognitive Status: Within Functional Limits for tasks assessed                                        General Comments  General comments (skin integrity, edema, etc.): (BLE edema in ankles bilaterally)    Exercises Other Exercises Other Exercises: Pt and caregivers educated re: OT role, DME recommendations, edema management, and d/c recommendations Other Exercises: Self-feeding, simulated grooming, therex seated in chair ( marches, over head raises)   Assessment/Plan    PT Assessment Patient needs continued PT services  PT Problem List Decreased strength;Decreased activity tolerance;Decreased mobility       PT Treatment Interventions Gait training;Functional mobility training;Therapeutic activities;Therapeutic exercise;Balance training    PT Goals (Current goals can be found in the Care Plan section)  Acute Rehab PT Goals Patient Stated Goal: gp home PT Goal Formulation: Patient unable to participate in goal setting Time For Goal Achievement: 10/30/19 Potential to Achieve Goals: Fair    Frequency Min 2X/week   Barriers to discharge        Co-evaluation               AM-PAC PT "6 Clicks" Mobility  Outcome Measure Help needed turning from your back to your side while in a flat bed without using bedrails?: Total Help needed moving from lying on your back to sitting on the side of a flat bed without using bedrails?: Total Help needed moving to and from a bed to a chair (including a wheelchair)?: Total Help needed standing up from a chair using your arms (e.g., wheelchair or bedside chair)?: Total Help needed to walk in hospital room?: Total Help needed climbing 3-5 steps with a railing? : Total 6 Click Score: 6    End of Session   Activity Tolerance: Patient limited by fatigue;Patient limited by lethargy Patient left: in chair;with family/visitor present Nurse Communication: Mobility status PT Visit Diagnosis: Repeated falls (R29.6);Muscle weakness (generalized) (M62.81);Difficulty in walking, not elsewhere classified (R26.2)    Time: 1400-1420 PT Time Calculation (min) (ACUTE  ONLY): 20 min   Charges:   PT Evaluation $PT Eval Low Complexity: 1 Low            [place], Sherryl Barters, PT DPT 10/16/2019, 3:29 PM

## 2019-10-16 NOTE — Progress Notes (Signed)
Patient on bedside pot at this time

## 2019-10-16 NOTE — Progress Notes (Signed)
PROGRESS NOTE    Kimberly Peterson   [id number]  DOB: 02/15/1930  PCP: [name], MD    DOA: 10/09/2019 LOS: 6    Brief Narrative   Kimberly Peterson is an 86 y.o. femalewith medical history significant fordiastolic CHF, CKD 3a, COPD, DM 2, HTN, chronic respiratory failure on 2 L/min home oxygen, chronic A. fib on Coumadin, OSA on CPAP and history of pacemaker and aortic valve replacement, hospitalized from 09/15/2019 to 09/22/2019 with CHF exacerbation who presented  to the ED on the night of 10/09/19  with the of shortness of breath, orthopnea and worsening lower extremity edema.  Admitted for management of acute on chronic diastolic CHF.  Patient developed worsening renal function and decreased urine output, now anuric. Nephrology consulted.   Assessment & Plan   Active Problems:   Cardiorenal syndrome with renal failure   Benign essential HTN   Chronic obstructive pulmonary disease (HCC)   Atrial fibrillation (HCC)   Type 2 diabetes with peripheral circulatory disorder, controlled (HCC)   Acute on chronic diastolic heart failure (HCC)   Elevated troponin   CKD (chronic kidney disease) stage 3, GFR 30-59 ml/min   Chronic respiratory failure with hypoxia (HCC)   H/O aortic valve replacement   Acute on chronic diastolic (congestive) heart failure (HCC)   Acute on chronic diastolic heart failure Severe pulmonary hypertension Patient with recurrent admissions for decompensated CHF.  Had just been discharged 2 weeks prior for same.  Again presented with dyspnea at rest, pedal edema.  Echocardiogram on 10/11/2019 showed EF greater than 55%, indeterminate diastolic function.  ReDS clip reading on 4/7 was 40, but patient has mod/large R pleural effusion and small one on left. -diuresis as below -Continue Coreg --Not on ACE/ARB likely secondary to CKD -Daily weights, intake and output monitoring, salt and fluid restriction --Follow-up with cardiology within 1 week of discharge (Dr.  [name])  AKI superimposed on CKD stage IIIa - AKI secondary to cardiorenal syndrome. Creatinine rising now up to 5 (baseline appears to 1.1-1.3).  Little to no urine output past 2 days seen on bladder scans.  Renal ultrasound showed chronic changes of CKD but nothing acute. --Trial of Lasix, if no improvement, family and patient agreeable to hospice  -monitor BMP --nephrology consulted  --patient not interested in dialysis  Hyperkalemia - due to renal failure --Lokelma today --BMP in AM  Nausea -resolved -Antiemetics as needed  Supratherapeutic INR -INR still over 5 despite oral vitamin K for past 3 days.   --will try IV vitamin K today to see if any improvement  --pharmacy following --hold warfarin --recheck INR tomorrow  Atrial fibrillation ([place]) -Continue Coumadin when INR comes down --Continue Coreg -Pharmacy to adjust Coumadin based on INR   Mildly elevated troponin -Troponins flat and likely related to demand ischemia from CHF exacerbation.  Chronic respiratory failure with hypoxia (HCC) Continue oxygen via nasal cannula.  Maintain O2 sat greater than 90%.  S/p pacemaker and aortic valve replacement -No acute issues. Patient gets her routine pacemaker checks -Follows with Dr. [name] at [place] [facility]  Benign essential HTN -continue Cartia, carvedilol and hydralazine  Chronic obstructive pulmonary disease ([place]) -DuoNebs as needed  Type 2 diabetes with peripheral circulatory disorder, controlled ([place]) -Sliding scale insulin for now pending med rec  OSA Continue CPAP at night.   Morbid obesity: Body mass index is 37.02 kg/m.  Contributes to shortness of breath, overall complicating factor.  DVT prophylaxis: on warfarin Diet:  Diet Orders (From admission,  onward)    Start     Ordered   10/10/19 0132  Diet heart healthy/carb modified Room service appropriate? Yes; Fluid consistency: Thin  Diet effective now    Question Answer Comment   Diet-HS Snack? Nothing   Room service appropriate? Yes   Fluid consistency: Thin      10/10/19 0136            Code Status: DNR    Subjective 10/16/19    Patient seen up in chair this morning.  She is more lethargic today.  She does report that she feels pretty good, "little better" she states.  States she does not feel short of breath.  Does report legs and abdomen feeling swollen.  This afternoon, patient's 2 daughters at bedside.  We discussed patient's prognosis and both are agreeable to honoring patient's wishes and would like to pursue hospice care if this last trial of diuresis is not successful.  They state their 4 other siblings are all in agreement as well.  Patient has been having recurrent hospital admissions for volume overload, and has told her children that she is tired of fighting.   Disposition Plan & Communication   Dispo & Barriers: Likely home with hospice in the next couple days.  Coming from: Home Exp d/c date: TBD, 4/12? Medically stable for d/c?  No  Family Communication: Patient's 2 daughters seen at bedside this afternoon, updated on patient's prognosis and options all questions answered.  They want patient comfortable and want to spend time with her at home.   Consults, Procedures, Significant Events   Consultants:   Nephrology  Pharmacy, warfarin dosing  Procedures:   None  Antimicrobials:   None   Objective   Vitals:   10/15/19 1219 10/15/19 1651 10/16/19 0413 10/16/19 0800  BP: (!) 100/47 (!) 88/53 (!) 100/56 (!) 107/57  Pulse: 64 66 64 66  Resp:  (!) 21 20 18   Temp:   98.3 F (36.8 C) (!) 97.3 F (36.3 C)  TempSrc:      SpO2: 98% 97% 97% 99%  Weight:   91.8 kg   Height:        Intake/Output Summary (Last 24 hours) at 10/16/2019 0824 Last data filed at 10/15/2019 1544 Gross per 24 hour  Intake 1005.11 ml  Output 0 ml  Net 1005.11 ml   Filed Weights   10/14/19 0257 10/15/19 0612 10/16/19 0413  Weight: 89.3 kg 90.7 kg  91.8 kg    Physical Exam:  General exam: sleeping but arousable, no acute distress, obese HEENT: moist mucus membranes, hearing grossly normal  Respiratory system: basilar crackles, increased respiratory effort with accessory muscle use, on 6 L/min nasal cannula oxygen. Cardiovascular system: normal [id number], RRR, 2/6 systolic murmur, right greater than left lower extremity pitting edema becoming very tense.   Central nervous system: O x4. no gross focal neurologic deficits, normal speech Extremities: moves all, normal tone Psychiatry: Normal mood, congruent affect, judgement and insight appear normal  Labs   Data Reviewed: I have personally reviewed following labs and imaging studies  CBC: Recent Labs  Lab 10/09/19 2348 10/13/19 0533 10/16/19 0533  WBC 5.5 6.0 8.4  NEUTROABS 3.4  --   --   HGB 10.4* 10.0* 10.0*  HCT 35.2* 34.2* 33.4*  MCV 80.9 83.2 79.5*  PLT 221 188 [id number]   Basic Metabolic Panel: Recent Labs  Lab 10/11/19 0856 10/11/19 0856 10/12/19 0543 10/13/19 0538 10/14/19 0746 10/15/19 0636 10/16/19 0533  NA 140   < >  134* 137 134* 134* 133*  K 4.4   < > 4.8 5.2* 5.6* 5.4* 4.9  CL 99   < > 96* 96* 93* 94* 91*  CO2 26   < > 27 29 26 25 26   GLUCOSE 211*   < > 246* 196* 184* 194* 204*  BUN 37*   < > 55* 65* 81* 94* 102*  CREATININE 1.70*   < > 2.60* 3.48* 4.51* 5.06* 5.74*  CALCIUM 9.0   < > 8.8* 8.7* 8.9 8.7* 8.6*  MG 2.5*  --  2.8*  --   --   --   --    < > = values in this interval not displayed.   GFR: Estimated Creatinine Clearance: 7.1 mL/min (A) (by C-G formula based on SCr of 5.74 mg/dL (H)). Liver Function Tests: Recent Labs  Lab 10/09/19 2348  AST 24  ALT 12  ALKPHOS 72  BILITOT 0.8  PROT 7.5  ALBUMIN 3.5   No results for input(s): LIPASE, AMYLASE in the last 168 hours. No results for input(s): AMMONIA in the last 168 hours. Coagulation Profile: Recent Labs  Lab 10/12/19 1005 10/13/19 0538 10/14/19 0746 10/15/19 0636 10/16/19 0533  INR  4.9* 6.1* 5.3* 5.6* 1.8*   Cardiac Enzymes: No results for input(s): CKTOTAL, CKMB, CKMBINDEX, TROPONINI in the last 168 hours. BNP (last 3 results) No results for input(s): PROBNP in the last 8760 hours. HbA1C: No results for input(s): HGBA1C in the last 72 hours. CBG: Recent Labs  Lab 10/15/19 0812 10/15/19 1220 10/15/19 1648 10/15/19 2113 10/16/19 0802  GLUCAP 205* 217* 238* 208* 184*   Lipid Profile: No results for input(s): CHOL, HDL, LDLCALC, TRIG, CHOLHDL, LDLDIRECT in the last 72 hours. Thyroid Function Tests: No results for input(s): TSH, T4TOTAL, FREET4, T3FREE, THYROIDAB in the last 72 hours. Anemia Panel: No results for input(s): VITAMINB12, FOLATE, FERRITIN, TIBC, IRON, RETICCTPCT in the last 72 hours. Sepsis Labs: No results for input(s): PROCALCITON, LATICACIDVEN in the last 168 hours.  Recent Results (from the past 240 hour(s))  SARS CORONAVIRUS 2 (TAT 6-24 HRS) Nasopharyngeal Nasopharyngeal Swab     Status: None   Collection Time: 10/09/19 11:48 PM   Specimen: Nasopharyngeal Swab  Result Value Ref Range Status   SARS Coronavirus 2 NEGATIVE NEGATIVE Final    Comment: (NOTE) SARS-CoV-2 target nucleic acids are NOT DETECTED. The SARS-CoV-2 RNA is generally detectable in upper and lower respiratory specimens during the acute phase of infection. Negative results do not preclude SARS-CoV-2 infection, do not rule out co-infections with other pathogens, and should not be used as the sole basis for treatment or other patient management decisions. Negative results must be combined with clinical observations, patient history, and epidemiological information. The expected result is Negative. Fact Sheet for Patients: SugarRoll.be Fact Sheet for Healthcare Providers: https://www.woods-mathews.com/ This test is not yet approved or cleared by the [place] FDA and  has been authorized for detection and/or diagnosis of  SARS-CoV-2 by FDA under an Emergency Use Authorization (EUA). This EUA will remain  in effect (meaning this test can be used) for the duration of the COVID-19 declaration under Section 56 4(b)(1) of the Act, 21 U.S.C. section 360bbb-3(b)(1), unless the authorization is terminated or revoked sooner. Performed at [facility], [place] [address] [place], [place] [address]       Imaging Studies   No results found.   Medications   Scheduled Meds: . aspirin EC  81 mg Oral Daily  . atorvastatin  10  mg Oral Daily  . carvedilol  6.25 mg Oral BID WC  . diltiazem  240 mg Oral Daily  . feeding supplement (NEPRO CARB STEADY)  237 mL Oral BID BM  . insulin aspart  0-15 Units Subcutaneous TID WC  . insulin aspart  0-5 Units Subcutaneous QHS  . pantoprazole  40 mg Oral Daily  . sodium chloride flush  3 mL Intravenous Q12H  . sodium chloride flush  3 mL Intravenous Q12H  . warfarin  5 mg Oral ONCE-1600  . Warfarin - Pharmacist Dosing Inpatient   Does not apply q1600   Continuous Infusions: . sodium chloride         LOS: 6 days    Time spent: 45 minutes    Ezekiel Slocumb, DO [facility]   If 7PM-7AM, please contact night-coverage [email or web address] 10/16/2019, 8:24 AM

## 2019-10-16 NOTE — Progress Notes (Signed)
[facility]  ROUNDING NOTE   Subjective:   S Creatinine worse Sitting up in the chair + SOB + LE edema Poor appetite   Objective:  Vital signs in last 24 hours:  Temp:  [97.3 F (36.3 C)-98.3 F (36.8 C)] 97.9 F (36.6 C) (04/11 1144) Pulse Rate:  [64-66] 64 (04/11 1144) Resp:  [18-21] 18 (04/11 1144) BP: (88-108)/(53-67) 108/67 (04/11 1144) SpO2:  [97 %-99 %] 99 % (04/11 1144) Weight:  [91.8 kg] 91.8 kg (04/11 0413)  Weight change: 1.089 kg Filed Weights   10/14/19 0257 10/15/19 0612 10/16/19 0413  Weight: 89.3 kg 90.7 kg 91.8 kg    Intake/Output: I/O last 3 completed shifts: In: 1005.1 [P.O.:960; IV Piggyback:45.1] Out: 0    Intake/Output this shift:  No intake/output data recorded.  Physical Exam: General: NAD, seated in chair  Head: Normocephalic, atraumatic. Moist oral mucosal membranes  Eyes: +blind  Neck: Supple,    Lungs:  Bilateral crackles, 5 L [place] oxygen  Heart:  Soft systolic murmur, no rub  Abdomen:  Soft, nontender, obese  Extremities: + Dependent peripheral edema.  Neurologic: Nonfocal, moving all four extremities  Skin: No lesions        Basic Metabolic Panel: Recent Labs  Lab 10/11/19 0856 10/11/19 0856 10/12/19 0543 10/12/19 0543 10/13/19 [id number] 10/13/19 [id number] 10/14/19 0746 10/15/19 0636 10/16/19 0533  NA 140   < > 134*  --  137  --  134* 134* 133*  K 4.4   < > 4.8  --  5.2*  --  5.6* 5.4* 4.9  CL 99   < > 96*  --  96*  --  93* 94* 91*  CO2 26   < > 27  --  29  --  26 25 26   GLUCOSE 211*   < > 246*  --  196*  --  184* 194* 204*  BUN 37*   < > 55*  --  65*  --  81* 94* 102*  CREATININE 1.70*   < > 2.60*  --  3.48*  --  4.51* 5.06* 5.74*  CALCIUM 9.0   < > 8.8*   < > 8.7*   < > 8.9 8.7* 8.6*  MG 2.5*  --  2.8*  --   --   --   --   --   --    < > = values in this interval not displayed.    Liver Function Tests: Recent Labs  Lab 10/09/19 2348  AST 24  ALT 12  ALKPHOS 72  BILITOT 0.8  PROT 7.5  ALBUMIN 3.5    No results for input(s): LIPASE, AMYLASE in the last 168 hours. No results for input(s): AMMONIA in the last 168 hours.  CBC: Recent Labs  Lab 10/09/19 2348 10/13/19 0533 10/16/19 0533  WBC 5.5 6.0 8.4  NEUTROABS 3.4  --   --   HGB 10.4* 10.0* 10.0*  HCT 35.2* 34.2* 33.4*  MCV 80.9 83.2 79.5*  PLT 221 188 163    Cardiac Enzymes: No results for input(s): CKTOTAL, CKMB, CKMBINDEX, TROPONINI in the last 168 hours.  BNP: Invalid input(s): POCBNP  CBG: Recent Labs  Lab 10/15/19 1220 10/15/19 1648 10/15/19 2113 10/16/19 0802 10/16/19 1145  GLUCAP 217* 238* 208* 184* 196*    Microbiology: Results for orders placed or performed during the hospital encounter of 10/09/19  SARS CORONAVIRUS 2 (TAT 6-24 HRS) Nasopharyngeal Nasopharyngeal Swab     Status: None   Collection Time: 10/09/19 11:48 PM  Specimen: Nasopharyngeal Swab  Result Value Ref Range Status   SARS Coronavirus 2 NEGATIVE NEGATIVE Final    Comment: (NOTE) SARS-CoV-2 target nucleic acids are NOT DETECTED. The SARS-CoV-2 RNA is generally detectable in upper and lower respiratory specimens during the acute phase of infection. Negative results do not preclude SARS-CoV-2 infection, do not rule out co-infections with other pathogens, and should not be used as the sole basis for treatment or other patient management decisions. Negative results must be combined with clinical observations, patient history, and epidemiological information. The expected result is Negative. Fact Sheet for Patients: SugarRoll.be Fact Sheet for Healthcare Providers: https://www.woods-mathews.com/ This test is not yet approved or cleared by the [place] FDA and  has been authorized for detection and/or diagnosis of SARS-CoV-2 by FDA under an Emergency Use Authorization (EUA). This EUA will remain  in effect (meaning this test can be used) for the duration of the COVID-19 declaration under  Section 56 4(b)(1) of the Act, 21 U.S.C. section 360bbb-3(b)(1), unless the authorization is terminated or revoked sooner. Performed at [facility], [place] [address], [place], [place] [address]     Coagulation Studies: Recent Labs    10/14/19 0746 10/15/19 0636 10/16/19 0533  LABPROT 48.4* 51.0* 21.1*  INR 5.3* 5.6* 1.8*    Urinalysis: No results for input(s): COLORURINE, LABSPEC, PHURINE, GLUCOSEU, HGBUR, BILIRUBINUR, KETONESUR, PROTEINUR, UROBILINOGEN, NITRITE, LEUKOCYTESUR in the last 72 hours.  Invalid input(s): APPERANCEUR    Imaging: No results found.   Medications:   . sodium chloride     . aspirin EC  81 mg Oral Daily  . atorvastatin  10 mg Oral Daily  . carvedilol  6.25 mg Oral BID WC  . diltiazem  240 mg Oral Daily  . feeding supplement (NEPRO CARB STEADY)  237 mL Oral BID BM  . insulin aspart  0-15 Units Subcutaneous TID WC  . insulin aspart  0-5 Units Subcutaneous QHS  . pantoprazole  40 mg Oral Daily  . sodium chloride flush  3 mL Intravenous Q12H  . sodium chloride flush  3 mL Intravenous Q12H  . warfarin  5 mg Oral ONCE-1600  . Warfarin - Pharmacist Dosing Inpatient   Does not apply q1600   sodium chloride, acetaminophen, ipratropium-albuterol, ondansetron **OR** ondansetron (ZOFRAN) IV, senna-docusate, sodium chloride flush, traZODone  Assessment/ Plan:  Ms. CHESTER FLORIANO is a 86 y.o. black female with diastolic congestive heart failure, COPD on 2L O2, diabetes mellitus type II insulin dependent, hypertension, atrial fibrillation, obstructive sleep apnea, pacemaker, aortic valve replacement on warfarin, who was admitted to [facility] on admitted on 10/09/2019 for Bilateral pleural effusion [J90] Acute on chronic diastolic congestive heart failure (HCC) [I50.33] Chronic respiratory failure with hypoxia (HCC) [J96.11] Acute on chronic diastolic (congestive) heart failure ([place]) [I50.33]  #. Acute renal failure with hyperkalemia on chronic kidney  disease stage IIIB with proteinuria: baseline creatinine of 1.1, GFR of 45 in 07/16/2019.  Chronic Kidney Disease seems to be secondary to diabetes and hypertension.  Acute renal failure most likely secondary to cardiorenal syndrome.  No IV contrast exposure. No obstruction on renal ultrasound Worsening renal function  Dialysis would be difficult for patient due to advanced age and multiple co morbidities.   It is not clear whether patient will need dialysis for short-term or long-term.  Her recent serum creatinine was 1.70 on October 11, 2019.  GFR 36. Based on creatinine of 1.7 recently,there is higher chance that dialysis requirement will be temporary but that cannot be definitively  determined. - trial of Furosemide  - Lokelma - palliative care to clarify goals of care  #. Diabetes mellitus type II with chronic kidney disease:  Lab Results  Component Value Date   HGBA1C 6.2 (H) 09/15/2019  insulin dependent.    #. Anemia with chronic kidney disease:  Lab Results  Component Value Date   HGB 10.0 (L) 10/16/2019   # LE Edema and fluid overload IV Lasix as needed.     LOS: 6 Cresencia Asmus 4/11/20212:40 PM

## 2019-10-16 NOTE — Progress Notes (Signed)
Patient has refused cpap tonight.

## 2019-10-17 DIAGNOSIS — Z515 Encounter for palliative care: Secondary | ICD-10-CM

## 2019-10-17 DIAGNOSIS — J449 Chronic obstructive pulmonary disease, unspecified: Secondary | ICD-10-CM

## 2019-10-17 LAB — BASIC METABOLIC PANEL
Anion gap: 18 — ABNORMAL HIGH (ref 5–15)
BUN: 116 mg/dL — ABNORMAL HIGH (ref 8–23)
CO2: 24 mmol/L (ref 22–32)
Calcium: 8.6 mg/dL — ABNORMAL LOW (ref 8.9–10.3)
Chloride: 91 mmol/L — ABNORMAL LOW (ref 98–111)
Creatinine, Ser: 6.47 mg/dL — ABNORMAL HIGH (ref 0.44–1.00)
GFR calc Af Amer: 6 mL/min — ABNORMAL LOW (ref >60)
GFR calc non Af Amer: 5 mL/min — ABNORMAL LOW (ref >60)
Glucose, Bld: 188 mg/dL — ABNORMAL HIGH (ref 70–99)
Potassium: 4.9 mmol/L (ref 3.5–5.1)
Sodium: 133 mmol/L — ABNORMAL LOW (ref 135–145)

## 2019-10-17 LAB — PROTEIN ELECTROPHORESIS, SERUM
A/G Ratio: 0.8 (ref 0.7–1.7)
Albumin ELP: 3.2 g/dL (ref 2.9–4.4)
Alpha-1-Globulin: 0.3 g/dL (ref 0.0–0.4)
Alpha-2-Globulin: 0.7 g/dL (ref 0.4–1.0)
Beta Globulin: 1.5 g/dL — ABNORMAL HIGH (ref 0.7–1.3)
Gamma Globulin: 1.5 g/dL (ref 0.4–1.8)
Globulin, Total: 4 g/dL — ABNORMAL HIGH (ref 2.2–3.9)
Total Protein ELP: 7.2 g/dL (ref 6.0–8.5)

## 2019-10-17 LAB — GLUCOSE, CAPILLARY
Glucose-Capillary: 191 mg/dL — ABNORMAL HIGH (ref 70–99)
Glucose-Capillary: 217 mg/dL — ABNORMAL HIGH (ref 70–99)

## 2019-10-17 LAB — PROTIME-INR
INR: 1.7 — ABNORMAL HIGH (ref 0.8–1.2)
Prothrombin Time: 20.1 seconds — ABNORMAL HIGH (ref 11.4–15.2)

## 2019-10-17 LAB — KAPPA/LAMBDA LIGHT CHAINS
Kappa free light chain: 123.1 mg/L — ABNORMAL HIGH (ref 3.3–19.4)
Kappa, lambda light chain ratio: 2.07 — ABNORMAL HIGH (ref 0.26–1.65)
Lambda free light chains: 59.5 mg/L — ABNORMAL HIGH (ref 5.7–26.3)

## 2019-10-17 MED ORDER — LORAZEPAM 1 MG PO TABS: 1.0000 mg | ORAL_TABLET | ORAL | 0 refills | Status: AC | PRN

## 2019-10-17 MED ORDER — NEPRO/CARBSTEADY PO LIQD: 237.0000 mL | Freq: Two times a day (BID) | ORAL | 0 refills | Status: AC

## 2019-10-17 MED ORDER — MORPHINE SULFATE (CONCENTRATE) 20 MG/ML PO SOLN: 10.0000 mg | ORAL | 0 refills | Status: AC | PRN

## 2019-10-17 MED ORDER — WARFARIN SODIUM 5 MG PO TABS
5.0000 mg | ORAL_TABLET | Freq: Once | ORAL | Status: DC
  Filled 2019-10-17: qty 1

## 2019-10-17 MED ORDER — ONDANSETRON HCL 4 MG PO TABS: 4.0000 mg | ORAL_TABLET | Freq: Four times a day (QID) | ORAL | 0 refills | Status: AC | PRN

## 2019-10-17 NOTE — Progress Notes (Signed)
Pt picked up by EMS for transport home with hospice. No belongings in room. IV and tele dc'd. Dtr [name] called to let her know they were on their way.

## 2019-10-17 NOTE — Discharge Summary (Signed)
Physician Discharge Summary  Kimberly Peterson [id number] DOB: Nov 02, 1929 DOA: 10/09/2019  PCP: [name], MD  Admit date: 10/09/2019 Discharge date: 10/17/2019  Admitted From: home Disposition:  Home with hospice  Recommendations for Outpatient Follow-up:  1. To be followed by [facility]: no  Equipment/Devices: hospital bed   Discharge Condition: guarded, comfort care status  CODE STATUS: DNR  Diet recommendation: for comfort  Brief/Interim Summary:   Kimberly Peterson an 86 y.o.femalewith medical history significant fordiastolic CHF, CKD 3a, COPD, DM 2, HTN, chronic respiratory failure on 2 L/min home oxygen, chronic A. fib on Coumadin, OSA on CPAP and history of pacemaker and aortic valve replacement, hospitalized from 09/15/2019 to 09/22/2019 with CHF exacerbation who presented to the ED on the night of 10/09/19  with the of shortness of breath, orthopnea and worsening lower extremity edema.  Admitted for management of acute on chronic diastolic CHF.  Patient developed worsening renal function and decreased urine output, now anuric. Nephrology consulted.  Comfort Care Status - patient and family decided on home hospice due to patients mutli-system organ failure with cardiorenal syndrome.  [facility] referral made, patient already followed for palliative services.    Acute on chronic diastolic heart failure Severe pulmonary hypertension Patient with recurrent admissions for decompensated CHF.  Had just been discharged 2 weeks prior for same.  Again presented with dyspnea at rest, pedal edema.  Echocardiogram on 10/11/2019 showed EF greater than 55%, indeterminate diastolic function.  ReDS clip reading on 4/7 was 40, but patient has mod/large R pleural effusion and small one on left. Diuresis led to worsening renal function consistent with cardiorenal function, ATN.  Patient declined dialysis.  Urine output ceased and renal function progressively worsened.   Patient and her family decided on full comfort measures with home hospice.  AKI superimposed on CKD stage IIIa - AKI progressive, with creatitine > 5 from baseline around 1.1-1.3.  Secondary to cardiorenal syndrome. Little to no urine output past few days and none seen on bladder scans.  Renal ultrasound showed chronic changes of CKD but nothing acute.  Nephrology consulted.  Patient declined dialysis.    Hyperkalemia - due to renal failure.  Treated with Lokelma.  Nausea -resolved with PRN antiemetics  Supratherapeutic INR - warfarin held.  INR remained over 5 despite oral vitamin K x 3 days.  Did improve with IV vitamin K.  Pharmacy consulted.  Atrial fibrillation - warfarin held as above, Coreg continued but held at times for lower BP's  Mildly elevated troponin - Troponins flat and likely related to demand ischemia from CHF exacerbation.  Patient free of chest pain.n  Chronic respiratory failure with hypoxia - Supplemental oxygen to maintain O2 sat greater than 90% or if needed for patient comfort.   S/p pacemaker and aortic valve replacement -No acute issues. Followed by Dr. [name]  Benign essential HTN -  Chronic obstructive pulmonary disease ([place]) Type 2 diabetes with peripheral circulatory disorder, controlled OSA - CPAP qHS if needed for comfort Home meds for above chronic conditions were discontinued given comfort status.  Oxygen, nebulizer treatments as needed for comfort.   Morbid obesity: Body mass index is 37.02 kg/m.  Contributes to shortness of breath, overall complicating factor.   Discharge Diagnoses: Active Problems:   Cardiorenal syndrome with renal failure   Benign essential HTN   Chronic obstructive pulmonary disease (HCC)   Atrial fibrillation (HCC)   Type 2 diabetes with peripheral circulatory disorder, controlled ([place])  Acute on chronic diastolic heart failure (HCC)   Elevated troponin   CKD (chronic kidney disease) stage 3, GFR 30-59  ml/min   Chronic respiratory failure with hypoxia (HCC)   H/O aortic valve replacement   Acute on chronic diastolic (congestive) heart failure [facility]   Hospice care patient    Discharge Instructions   Discharge Instructions    Call MD for:   Complete by: As directed    Call hospice for any signs of pain, discomfort, anxiety, agitation or other distress.   Call MD for:  persistant nausea and vomiting   Complete by: As directed    Call MD for:  severe uncontrolled pain   Complete by: As directed    Diet - low sodium heart healthy   Complete by: As directed    Increase activity slowly   Complete by: As directed      Allergies as of 10/17/2019   No Known Allergies     Medication List    STOP taking these medications   aspirin 81 MG tablet   atorvastatin 10 MG tablet Commonly known as: LIPITOR   Cartia XT 240 MG 24 hr capsule Generic drug: diltiazem   carvedilol 6.25 MG tablet Commonly known as: COREG   hydrALAZINE 25 MG tablet Commonly known as: APRESOLINE   insulin aspart protamine- aspart (70-30) 100 UNIT/ML injection Commonly known as: NOVOLOG MIX 70/30   torsemide 20 MG tablet Commonly known as: [name]   warfarin 5 MG tablet Commonly known as: COUMADIN     TAKE these medications   feeding supplement (NEPRO CARB STEADY) Liqd Take 237 mLs by mouth 2 (two) times daily between meals.   ipratropium-albuterol 0.5-2.5 (3) MG/3ML Soln Commonly known as: DUONEB Take 3 mLs by nebulization every 6 (six) hours as needed. Dx-shortness of breath   LORazepam 1 MG tablet Commonly known as: Ativan Take 1 tablet (1 mg total) by mouth every 4 (four) hours as needed (anxiety or agitation).   morphine 20 MG/ML concentrated solution Commonly known as: ROXANOL Take 0.5 mLs (10 mg total) by mouth every 4 (four) hours as needed (air hunger or pain).   nystatin ointment Commonly known as: MYCOSTATIN Apply 1 application topically 2 (two) times daily.   ondansetron 4 MG  tablet Commonly known as: ZOFRAN Take 1 tablet (4 mg total) by mouth every 6 (six) hours as needed for nausea.   pantoprazole 40 MG tablet Commonly known as: PROTONIX TAKE 1 TABLET BY MOUTH ONCE DAILY FOR STOMACH      Follow-up Information    [facility] Follow up on 10/20/2019.   Specialty: Cardiology Why: at 10:30am. Enter through the [place] entrance Contact information: [address] [place] [phone]         No Known Allergies  Consultations:  Nephrology   Pharmacy   Procedures/Studies: US RENAL  Result Date: 10/13/2019 CLINICAL DATA:  Acute renal failure. EXAM: RENAL / URINARY TRACT ULTRASOUND COMPLETE COMPARISON:  September 07, 2013.  CT dated Nov 21, 2013 FINDINGS: Right Kidney: Renal measurements: 9.5 x 4.4 x 5.1 cm = volume: 111 mL. There is no hydronephrosis. There is increased cortical echogenicity. Left Kidney: Renal measurements: 10.7 x 5 x 4.8 cm = volume: 135 mL. There is no hydronephrosis. There is increased cortical echogenicity. Bladder: Appears normal for degree of bladder distention. Other: None. IMPRESSION: 1. No acute abnormality. 2. Increased cortical echogenicity bilaterally which can be seen in patients with  medical renal disease. Electronically Signed   By: Constance Holster M.D.   On: 10/13/2019 23:28   US Venous Img Lower Bilateral (DVT)  Result Date: 09/18/2019 CLINICAL DATA:  Lower extremity edema EXAM: BILATERAL LOWER EXTREMITY VENOUS DOPPLER ULTRASOUND TECHNIQUE: Gray-scale sonography with graded compression, as well as color Doppler and duplex ultrasound were performed to evaluate the lower extremity deep venous systems from the level of the common femoral vein and including the common femoral, femoral, profunda femoral, popliteal and calf veins including the posterior tibial, peroneal and gastrocnemius veins when visible. The superficial great saphenous vein  was also interrogated. Spectral Doppler was utilized to evaluate flow at rest and with distal augmentation maneuvers in the common femoral, femoral and popliteal veins. COMPARISON:  None. FINDINGS: RIGHT LOWER EXTREMITY Common Femoral Vein: No evidence of thrombus. Normal compressibility, respiratory phasicity and response to augmentation. Saphenofemoral Junction: No evidence of thrombus. Normal compressibility and flow on color Doppler imaging. Profunda Femoral Vein: No evidence of thrombus. Normal compressibility and flow on color Doppler imaging. Femoral Vein: No evidence of thrombus. Normal compressibility, respiratory phasicity and response to augmentation. Popliteal Vein: No evidence of thrombus. Normal compressibility, respiratory phasicity and response to augmentation. Calf Veins: No evidence of thrombus. Normal compressibility and flow on color Doppler imaging. LEFT LOWER EXTREMITY Common Femoral Vein: No evidence of thrombus. Normal compressibility, respiratory phasicity and response to augmentation. Saphenofemoral Junction: No evidence of thrombus. Normal compressibility and flow on color Doppler imaging. Profunda Femoral Vein: No evidence of thrombus. Normal compressibility and flow on color Doppler imaging. Femoral Vein: No evidence of thrombus. Normal compressibility, respiratory phasicity and response to augmentation. Popliteal Vein: No evidence of thrombus. Normal compressibility, respiratory phasicity and response to augmentation. Calf Veins: No evidence of thrombus. Normal compressibility and flow on color Doppler imaging. IMPRESSION: No evidence of deep venous thrombosis in either lower extremity. Electronically Signed   By: Jerilynn Mages.  Shick M.D.   On: 09/18/2019 10:56   DG Chest Portable 1 View  Result Date: 10/10/2019 CLINICAL DATA:  Dyspnea on exertion EXAM: PORTABLE CHEST 1 VIEW COMPARISON:  September 21, 2019 FINDINGS: There is a moderate to large right-sided pleural effusion. There is a small  left-sided pleural effusion. There is a dual chamber pacemaker in place. The heart size is enlarged. Aortic calcifications are noted. The patient is status post prior median sternotomy. There is no pneumothorax. Vascular congestion with developing pulmonary edema is noted. IMPRESSION: Cardiomegaly with developing pulmonary edema and bilateral pleural effusions, right greater than left. Electronically Signed   By: Constance Holster M.D.   On: 10/10/2019 00:25   DG Chest Port 1 View  Result Date: 09/21/2019 CLINICAL DATA:  Hypoxia. EXAM: PORTABLE CHEST 1 VIEW COMPARISON:  09/19/2019 FINDINGS: There is a left chest wall pacer device with lead in the right atrial appendage and right ventricle. Previous median sternotomy and CABG procedure. Mild cardiac enlargement. Unchanged bilateral pleural effusions and mild pulmonary edema. IMPRESSION: No change in CHF pattern. Electronically Signed   By: Kerby Moors M.D.   On: 09/21/2019 09:49   DG Chest Port 1 View  Result Date: 09/19/2019 CLINICAL DATA:  Hypoxia. EXAM: PORTABLE CHEST 1 VIEW COMPARISON:  September 18, 2019. FINDINGS: Stable cardiomegaly. Sternotomy wires are noted. Left-sided pacemaker is unchanged in position. No pneumothorax is noted. Small right pleural effusion is noted with associated subsegmental atelectasis. Bony thorax is unremarkable. IMPRESSION: Small right pleural effusion with associated subsegmental atelectasis. Electronically Signed   By: Bobbe Medico.D.  On: 09/19/2019 08:09   DG Chest Port 1 View  Result Date: 09/18/2019 CLINICAL DATA:  Hypoxia per physician order. Pt admitted 09/14/2019 with worsening SOB and weight gain. Hx - CHF, CKD, COPD, DM, HTN, pacemaker, aortic valve replacement, non-smoker. EXAM: PORTABLE CHEST 1 VIEW COMPARISON:  09/17/2019 FINDINGS: Stable cardiomegaly and changes from prior cardiac surgery. Mild hazy opacity at the lung bases, greater on the right, consistent with small effusions. Stable vascular  congestion without overt pulmonary edema. No pneumothorax. IMPRESSION: 1. No significant change from the previous day's exam. 2. Right greater than left lung base opacities consistent with small effusions with atelectasis. No convincing pneumonia and no overt pulmonary edema. Electronically Signed   By: Lajean Manes M.D.   On: 09/18/2019 08:39   ECHOCARDIOGRAM COMPLETE  Result Date: 10/11/2019    ECHOCARDIOGRAM REPORT   Patient Name:   Kimberly Peterson Date of Exam: 10/11/2019 Medical Rec #:  [id number]       Height:       62.0 in Accession #:    [id number]      Weight:       197.6 lb Date of Birth:  1929-11-02        BSA:          1.902 m Patient Age:    61 years        BP:           126/55 mmHg Patient Gender: F               HR:           65 bpm. Exam Location:  [facility] Procedure: 2D Echo, Color Doppler and Cardiac Doppler Indications:     I50.31 CHF-Acute Diastolic  History:         Patient has prior history of Echocardiogram examinations, most                  recent 07/12/2019. CHF, COPD and CKD; Risk Factors:Hypertension                  and Diabetes.  Sonographer:     Charmayne Sheer RDCS (AE) Referring Phys:  [id number] Athena Masse Diagnosing Phys: Nelva Bush MD  Sonographer Comments: Technically difficult study due to poor echo windows. Image acquisition challenging due to patient body habitus, performed w/ pt sitting in chair, Image acquisition challenging due to respiratory motion and Image acquisition challenging due to COPD. IMPRESSIONS  1. Left ventricular ejection fraction, by estimation, is >55%. The left ventricle has normal function. Left ventricular endocardial border not optimally defined to evaluate regional wall motion. There is mild left ventricular hypertrophy. Left ventricular diastolic parameters are indeterminate.  2. Right ventricular systolic function was not well visualized. The right ventricular size is not well visualized.  3. The mitral valve is abnormal. Mild mitral valve regurgitation.  Mild to moderate mitral stenosis.  4. The aortic valve was not well visualized. Aortic valve regurgitation is not visualized. No aortic stenosis is present. FINDINGS  Left Ventricle: Left ventricular ejection fraction, by estimation, is >55%. The left ventricle has normal function. Left ventricular endocardial border not optimally defined to evaluate regional wall motion. The left ventricular internal cavity size was  normal in size. There is mild left ventricular hypertrophy. Left ventricular diastolic parameters are indeterminate. Right Ventricle: The right ventricular size is not well visualized. Right vetricular wall thickness was not assessed. Right ventricular systolic function was not well visualized. Left Atrium: Left atrial size was  normal in size. Right Atrium: Right atrial size was not well visualized. Pericardium: The pericardium was not well visualized. Mitral Valve: The mitral valve is abnormal. There is severe thickening of the mitral valve leaflet(s). Severe mitral annular calcification. Mild mitral valve regurgitation. Mild to moderate mitral valve stenosis. MV peak gradient, 12.1 mmHg. The mean mitral valve gradient is 5.0 mmHg. Tricuspid Valve: The tricuspid valve is not well visualized. Tricuspid valve regurgitation is not demonstrated. Aortic Valve: The aortic valve was not well visualized. Aortic valve regurgitation is not visualized. No aortic stenosis is present. Aortic valve mean gradient measures 6.0 mmHg. Aortic valve peak gradient measures 12.1 mmHg. Aortic valve area, by VTI measures 1.94 cm. Pulmonic Valve: The pulmonic valve was not well visualized. Pulmonic valve regurgitation is not visualized. No evidence of pulmonic stenosis. Aorta: The aortic root is normal in size and structure. Pulmonary Artery: The pulmonary artery is not well seen. Venous: The inferior vena cava was not well visualized. IAS/Shunts: The interatrial septum was not well visualized.  LEFT VENTRICLE PLAX 2D LVIDd:          4.07 cm LVIDs:         3.75 cm LV PW:         1.28 cm LV IVS:        0.99 cm LVOT diam:     2.30 cm LV SV:         64 LV SV Index:   34 LVOT Area:     4.15 cm  LEFT ATRIUM           Index LA diam:      5.90 cm 3.10 cm/m LA Vol (A2C): 50.8 ml 26.71 ml/m  AORTIC VALVE                    PULMONIC VALVE AV Area (Vmax):    1.68 cm     PV Vmax:       0.93 m/s AV Area (Vmean):   2.02 cm     PV Vmean:      57.900 cm/s AV Area (VTI):     1.94 cm     PV VTI:        0.199 m AV Vmax:           174.00 cm/s  PV Peak grad:  3.5 mmHg AV Vmean:          110.000 cm/s PV Mean grad:  2.0 mmHg AV VTI:            0.330 m AV Peak Grad:      12.1 mmHg AV Mean Grad:      6.0 mmHg LVOT Vmax:         70.30 cm/s LVOT Vmean:        53.600 cm/s LVOT VTI:          0.154 m LVOT/AV VTI ratio: 0.47  AORTA Ao Root diam: 2.70 cm MITRAL VALVE MV Area (PHT): 3.48 cm     SHUNTS MV Peak grad:  12.1 mmHg    Systemic VTI:  0.15 m MV Mean grad:  5.0 mmHg     Systemic Diam: 2.30 cm MV Vmax:       1.74 m/s MV Vmean:      98.2 cm/s MV Decel Time: 218 msec MV E velocity: 187.00 cm/s MV A velocity: 69.00 cm/s MV E/A ratio:  2.71 Harrell Gave End MD Electronically signed by Nelva Bush MD Signature Date/Time: 10/11/2019/6:15:42 PM    Final  Subjective: Patient sleeping in chair, appears comfortable.  Does respond to verbal and tactile stimulus and responds to questions but quickly falls back to sleep.  She denies pain or discomfort.  Denies feeling short of breath.  Says legs feel tight but not painful.   Discharge Exam: Vitals:   10/17/19 0524 10/17/19 0804  BP: (!) 127/53 (!) 110/57  Pulse: 64 65  Resp:  18  Temp:  98.3 F (36.8 C)  SpO2: 93% 100%   Vitals:   10/16/19 1633 10/16/19 1954 10/17/19 0524 10/17/19 0804  BP: (!) 97/55 (!) 99/57 (!) 127/53 (!) 110/57  Pulse: 64 65 64 65  Resp: 19 19  18   Temp:    98.3 F (36.8 C)  TempSrc:      SpO2: 97% 99% 93% 100%  Weight:      Height:        General: Pt is  sleeping in chair, not in acute distress, morbidly obese Cardiovascular: RRR, S1/S2 +, no rubs, no gallops Respiratory: CTA bilaterally, no wheezing, no rhonchi Abdominal: somewhat distended but nontender, bowel sounds + Extremities: tense pitting edema BLE's, venous stasis skin changes of distal BLE's appears stable    The results of significant diagnostics from this hospitalization (including imaging, microbiology, ancillary and laboratory) are listed below for reference.     Microbiology: Recent Results (from the past 240 hour(s))  SARS CORONAVIRUS 2 (TAT 6-24 HRS) Nasopharyngeal Nasopharyngeal Swab     Status: None   Collection Time: 10/09/19 11:48 PM   Specimen: Nasopharyngeal Swab  Result Value Ref Range Status   SARS Coronavirus 2 NEGATIVE NEGATIVE Final    Comment: (NOTE) SARS-CoV-2 target nucleic acids are NOT DETECTED. The SARS-CoV-2 RNA is generally detectable in upper and lower respiratory specimens during the acute phase of infection. Negative results do not preclude SARS-CoV-2 infection, do not rule out co-infections with other pathogens, and should not be used as the sole basis for treatment or other patient management decisions. Negative results must be combined with clinical observations, patient history, and epidemiological information. The expected result is Negative. Fact Sheet for Patients: SugarRoll.be Fact Sheet for Healthcare Providers: https://www.woods-mathews.com/ This test is not yet approved or cleared by the [place] FDA and  has been authorized for detection and/or diagnosis of SARS-CoV-2 by FDA under an Emergency Use Authorization (EUA). This EUA will remain  in effect (meaning this test can be used) for the duration of the COVID-19 declaration under Section 56 4(b)(1) of the Act, 21 U.S.C. section 360bbb-3(b)(1), unless the authorization is terminated or revoked sooner. Performed at [facility], [place] [address] [place],  [address]      Labs: BNP (last 3 results) Recent Labs    09/20/19 0433 09/21/19 0557 10/09/19 2348  BNP 527.0* 588.0* 0000000*   Basic Metabolic Panel: Recent Labs  Lab 10/11/19 [id number] 10/11/19 [id number] 10/12/19 0543 10/12/19 0543 10/13/19 0538 10/14/19 0746 10/15/19 0636 10/16/19 0533 10/17/19 0343  NA 140   < > 134*   < > 137 134* 134* 133* 133*  K 4.4   < > 4.8   < > 5.2* 5.6* 5.4* 4.9 4.9  CL 99   < > 96*   < > 96* 93* 94* 91* 91*  CO2 26   < > 27   < > 29 26 25 26 24   GLUCOSE 211*   < > 246*   < > 196* 184* 194* 204* 188*  BUN 37*   < > 55*   < >  65* 81* 94* 102* 116*  CREATININE 1.70*   < > 2.60*   < > 3.48* 4.51* 5.06* 5.74* 6.47*  CALCIUM 9.0   < > 8.8*   < > 8.7* 8.9 8.7* 8.6* 8.6*  MG 2.5*  --  2.8*  --   --   --   --   --   --    < > = values in this interval not displayed.   Liver Function Tests: No results for input(s): AST, ALT, ALKPHOS, BILITOT, PROT, ALBUMIN in the last 168 hours. No results for input(s): LIPASE, AMYLASE in the last 168 hours. No results for input(s): AMMONIA in the last 168 hours. CBC: Recent Labs  Lab 10/13/19 0533 10/16/19 0533  WBC 6.0 8.4  HGB 10.0* 10.0*  HCT 34.2* 33.4*  MCV 83.2 79.5*  PLT 188 163   Cardiac Enzymes: No results for input(s): CKTOTAL, CKMB, CKMBINDEX, TROPONINI in the last 168 hours. BNP: Invalid input(s): POCBNP CBG: Recent Labs  Lab 10/16/19 1635 10/16/19 2107 10/16/19 2111 10/17/19 0802 10/17/19 1151  GLUCAP 239* 173* 172* 191* 217*   D-Dimer No results for input(s): DDIMER in the last 72 hours. Hgb A1c No results for input(s): HGBA1C in the last 72 hours. Lipid Profile No results for input(s): CHOL, HDL, LDLCALC, TRIG, CHOLHDL, LDLDIRECT in the last 72 hours. Thyroid function studies No results for input(s): TSH, T4TOTAL, T3FREE, THYROIDAB in the last 72 hours.  Invalid input(s): FREET3 Anemia work up No results for input(s): VITAMINB12, FOLATE,  FERRITIN, TIBC, IRON, RETICCTPCT in the last 72 hours. Urinalysis    Component Value Date/Time   COLORURINE YELLOW (A) 07/11/2019 1242   APPEARANCEUR CLEAR (A) 07/11/2019 1242   APPEARANCEUR Clear 06/09/2018 1502   LABSPEC 1.013 07/11/2019 1242   LABSPEC 1.012 09/07/2013 1418   PHURINE 5.0 07/11/2019 1242   GLUCOSEU NEGATIVE 07/11/2019 1242   GLUCOSEU Negative 09/07/2013 1418   HGBUR NEGATIVE 07/11/2019 1242   BILIRUBINUR NEGATIVE 07/11/2019 1242   BILIRUBINUR Negative 06/09/2018 1502   BILIRUBINUR Negative 09/07/2013 1418   KETONESUR NEGATIVE 07/11/2019 1242   PROTEINUR 100 (A) 07/11/2019 1242   UROBILINOGEN 0.2 11/21/2013 1253   NITRITE NEGATIVE 07/11/2019 1242   LEUKOCYTESUR NEGATIVE 07/11/2019 1242   LEUKOCYTESUR 3+ 09/07/2013 1418   Sepsis Labs Invalid input(s): PROCALCITONIN,  WBC,  LACTICIDVEN Microbiology Recent Results (from the past 240 hour(s))  SARS CORONAVIRUS 2 (TAT 6-24 HRS) Nasopharyngeal Nasopharyngeal Swab     Status: None   Collection Time: 10/09/19 11:48 PM   Specimen: Nasopharyngeal Swab  Result Value Ref Range Status   SARS Coronavirus 2 NEGATIVE NEGATIVE Final    Comment: (NOTE) SARS-CoV-2 target nucleic acids are NOT DETECTED. The SARS-CoV-2 RNA is generally detectable in upper and lower respiratory specimens during the acute phase of infection. Negative results do not preclude SARS-CoV-2 infection, do not rule out co-infections with other pathogens, and should not be used as the sole basis for treatment or other patient management decisions. Negative results must be combined with clinical observations, patient history, and epidemiological information. The expected result is Negative. Fact Sheet for Patients: SugarRoll.be Fact Sheet for Healthcare Providers: https://www.woods-mathews.com/ This test is not yet approved or cleared by the [place] FDA and  has been authorized for detection and/or  diagnosis of SARS-CoV-2 by FDA under an Emergency Use Authorization (EUA). This EUA will remain  in effect (meaning this test can be used) for the duration of the COVID-19 declaration under Section 56 4(b)(1) of the Act, 21 U.S.C.  section 360bbb-3(b)(1), unless the authorization is terminated or revoked sooner. Performed at [facility], [place] [address], [place], [place] [address]      Time coordinating discharge: Over 30 minutes  SIGNED:   Ezekiel Slocumb, DO [facility] 10/17/2019, 12:15 PM   If 7PM-7AM, please contact night-coverage [email or web address]

## 2019-10-17 NOTE — Progress Notes (Signed)
[employer] [facility]) Liaison RN Note  Notified by Roseanne Reno, RN with [facility] team of family request for [facility] hospice services at home after discharge. Chart and patient information under review by [facility] physician. Hospice eligibility pending at this time.   Spoke [name] to initiate education related to hospice philosophy, services and team approach to care. Daughter verbalized understanding of information given. Per discussion plan is for discharge today via EMS.  DME needs discussed. Patient already has a 3 in 1, walker, O2 concentrator and tanks in the home through [facility] Patient. Daughter requests a transfer chair to be delivered to the home and is aware O2 will need to be changed out to a contracted company with [employer].  Please send signed and completed out of facility DNR form home with the patient.  Patient will need prescriptions for discharge comfort medications.  [place] referral center aware of the above.  Above information shared with Roseanne Reno, RN with TOC.  Please call with any hospice related questions or concerns.  Thank you, Margaretmary Eddy, BSN, RN [employer] [facility] Liaison [phone]

## 2019-10-17 NOTE — Progress Notes (Signed)
ANTICOAGULATION CONSULT NOTE - Follow up [place] for Warfarin (home medication) Indication: atrial fibrillation  No Known Allergies  Patient Measurements: Height: 5\' 2"  (157.5 cm) Weight: (pt refused weight) IBW/kg (Calculated) : 50.1  Vital Signs: BP: 127/53 (04/12 0524) Pulse Rate: 64 (04/12 0524)  Labs: Recent Labs    10/15/19 0636 10/16/19 0533 10/17/19 0343  HGB  --  10.0*  --   HCT  --  33.4*  --   PLT  --  163  --   LABPROT 51.0* 21.1* 20.1*  INR 5.6* 1.8* 1.7*  CREATININE 5.06* 5.74* 6.47*    Estimated Creatinine Clearance: 6.3 mL/min (A) (by C-G formula based on SCr of 6.47 mg/dL (H)).   Medical History: Past Medical History:  Diagnosis Date  . Anemia   . CHF (congestive heart failure) ([place])   . Chronic kidney disease   . COPD (chronic obstructive pulmonary disease) ([place])   . Diabetes mellitus without complication ([place])   . Hypertension     Assessment: Pharmacy asked to manage Warfarin per protocol in this 86 yo female on warfarin PTA for AFib.  INR 3.0 on admission. Home warfarin regimen per PTA med list: 5 mg every day except 7.5 mg on Thursdays, last dose 4/4 PTA.  Renal function continues to worsen  4/4 INR 3.0 Warfarin 2mg  4/6 INR 3.7 holding dose 4/7 INR 5.0, repeat INR@1005  4.9, Vit K 2.5mg  PO given, warfarin held 4/8 INR 6.1, Vit K 5mg  PO given, warfarin held 4/9 INR 5.3, Vit K 5mg  po given, warfarin held 4/10 INR 5.6, Vit K 5mg  IV given, warfarin held 4/11 INR 1.8, Warfarin 5mg  4/12 INR 1.7    Goal of Therapy:  INR 2-3 - INR goal confirmed with hospitalist  Monitor platelets by anticoagulation protocol: Yes   Plan:  4/11 INR 1.7, subtherapeutic, will continue warfarin 5mg  this evening  Daily INR daily and CBC at least every three days per policy  Pharmacy will continue to follow.   Lu Duffel, PharmD, BCPS Clinical Pharmacist 10/17/2019 7:19 AM

## 2019-10-17 NOTE — TOC Progression Note (Signed)
Transition of Care [facility]) - Progression Note    Patient Details  Name: Kimberly Peterson MRN: [id number] Date of Birth: April 14, 1930  Transition of Care [facility]) CM/SW [place], RN Phone Number: 10/17/2019, 8:52 AM  Clinical Narrative:     Referral to Earnestine Mealing, RN [facility] for [facility].  Patient is currently a Palliative Care patient of theres.          Expected Discharge Plan and Services                                                 Social Determinants of Health (SDOH) Interventions    Readmission Risk Interventions Readmission Risk Prevention Plan 07/13/2019 03/05/2019 03/03/2019  Transportation Screening Complete Complete Complete  PCP or Specialist Appt within 3-5 Days - Complete -  [place]RI or [place] - Complete Complete  Social Work Consult for [place] Planning/Counseling - Complete -  Palliative Care Screening - Not Applicable Complete  Medication Review Press photographer) Referral to Pharmacy Complete Complete  [place] or Home Care Consult Complete - -  Palliative Care Screening Complete - -  [place] Not Complete - -  Some recent data might be hidden

## 2019-10-17 NOTE — Care Management Important Message (Signed)
Important Message  Patient Details  Name: Kimberly Peterson MRN: [id number] Date of Birth: [birth year]   Medicare Important Message Given:  Yes     Dannette Barbara 10/17/2019, 12:54 PM

## 2019-10-17 NOTE — TOC Transition Note (Signed)
Transition of Care [facility]) - CM/SW Discharge Note   Patient Details  Name: Kimberly Peterson MRN: [id number] Date of Birth: Sep 21, 1929  Transition of Care [facility]) CM/SW Contact:  Victorino Dike, RN Phone Number: 10/17/2019, 1:42 PM   Clinical Narrative:     Patient has necessary DME at home with hospice, spoke with [name] (daughter)  She requests patient be brought home by ambulance due to weakness.  EMS called, DNR, Facesheet and Medical Necessity, placed in discharge packet and given to the nurse.  No further TOC needs at this time, please re-consult for new needs.   Final next level of care: Home w Hospice Care Barriers to Discharge: Barriers Resolved   Patient Goals and CMS Choice     Choice offered to / list presented to : Patient  Discharge Placement                  Name of family member notified: [name] Patient and family notified of of transfer: 10/17/19  Discharge Plan and Services                            [place]: [facility]        Social Determinants of Health (SDOH) Interventions     Readmission Risk Interventions Readmission Risk Prevention Plan 07/13/2019 03/05/2019 03/03/2019  Transportation Screening Complete Complete Complete  PCP or Specialist Appt within 3-5 Days - Complete -  HRI or [place] - Complete Complete  Social Work Consult for [place] Planning/Counseling - Complete -  Palliative Care Screening - Not Applicable Complete  Medication Review Press photographer) Referral to Pharmacy Complete Complete  [place] or Home Care Consult Complete - -  Palliative Care Screening Complete - -  [place] Not Complete - -  Some recent data might be hidden

## 2019-10-17 NOTE — Progress Notes (Signed)
[facility]  ROUNDING NOTE   Subjective:   S Creatinine worse Sitting up in the chair + SOB + LE edema Poor appetite   Objective:  Vital signs in last 24 hours:  Temp:  [98.3 F (36.8 C)] 98.3 F (36.8 C) (04/12 0804) Pulse Rate:  [64-65] 65 (04/12 0804) Resp:  [18-19] 18 (04/12 0804) BP: (97-127)/(53-57) 110/57 (04/12 0804) SpO2:  [93 %-100 %] 100 % (04/12 0804)  Weight change:  Filed Weights   10/14/19 0257 10/15/19 0612 10/16/19 0413  Weight: 89.3 kg 90.7 kg 91.8 kg    Intake/Output: No intake/output data recorded.   Intake/Output this shift:  No intake/output data recorded.  Physical Exam: General: NAD, seated in chair  Head: Normocephalic, atraumatic. Moist oral mucosal membranes  Eyes: +blind  Neck: Supple,    Lungs:  Bilateral crackles, 5 L [place] oxygen, OSA pattern of breathing  Heart:  Soft systolic murmur, no rub  Abdomen:  Soft, nontender, obese  Extremities: + Dependent peripheral edema.  Neurologic:  Less responsive today, did not answer questions  Skin: No lesions        Basic Metabolic Panel: Recent Labs  Lab 10/11/19 0856 10/11/19 0856 10/12/19 0543 10/12/19 0543 10/13/19 [id number] 10/13/19 [id number] 10/14/19 0746 10/14/19 0746 10/15/19 0636 10/16/19 0533 10/17/19 0343  NA 140   < > 134*   < > 137  --  134*  --  134* 133* 133*  K 4.4   < > 4.8   < > 5.2*  --  5.6*  --  5.4* 4.9 4.9  CL 99   < > 96*   < > 96*  --  93*  --  94* 91* 91*  CO2 26   < > 27   < > 29  --  26  --  25 26 24   GLUCOSE 211*   < > 246*   < > 196*  --  184*  --  194* 204* 188*  BUN 37*   < > 55*   < > 65*  --  81*  --  94* 102* 116*  CREATININE 1.70*   < > 2.60*   < > 3.48*  --  4.51*  --  5.06* 5.74* 6.47*  CALCIUM 9.0   < > 8.8*   < > 8.7*   < > 8.9   < > 8.7* 8.6* 8.6*  MG 2.5*  --  2.8*  --   --   --   --   --   --   --   --    < > = values in this interval not displayed.    Liver Function Tests: No results for input(s): AST, ALT, ALKPHOS, BILITOT, PROT,  ALBUMIN in the last 168 hours. No results for input(s): LIPASE, AMYLASE in the last 168 hours. No results for input(s): AMMONIA in the last 168 hours.  CBC: Recent Labs  Lab 10/13/19 0533 10/16/19 0533  WBC 6.0 8.4  HGB 10.0* 10.0*  HCT 34.2* 33.4*  MCV 83.2 79.5*  PLT 188 163    Cardiac Enzymes: No results for input(s): CKTOTAL, CKMB, CKMBINDEX, TROPONINI in the last 168 hours.  BNP: Invalid input(s): POCBNP  CBG: Recent Labs  Lab 10/16/19 1635 10/16/19 2107 10/16/19 2111 10/17/19 0802 10/17/19 1151  GLUCAP 239* 173* 172* 191* 217*    Microbiology: Results for orders placed or performed during the hospital encounter of 10/09/19  SARS CORONAVIRUS 2 (TAT 6-24 HRS) Nasopharyngeal Nasopharyngeal Swab  Status: None   Collection Time: 10/09/19 11:48 PM   Specimen: Nasopharyngeal Swab  Result Value Ref Range Status   SARS Coronavirus 2 NEGATIVE NEGATIVE Final    Comment: (NOTE) SARS-CoV-2 target nucleic acids are NOT DETECTED. The SARS-CoV-2 RNA is generally detectable in upper and lower respiratory specimens during the acute phase of infection. Negative results do not preclude SARS-CoV-2 infection, do not rule out co-infections with other pathogens, and should not be used as the sole basis for treatment or other patient management decisions. Negative results must be combined with clinical observations, patient history, and epidemiological information. The expected result is Negative. Fact Sheet for Patients: SugarRoll.be Fact Sheet for Healthcare Providers: https://www.woods-mathews.com/ This test is not yet approved or cleared by the [place] FDA and  has been authorized for detection and/or diagnosis of SARS-CoV-2 by FDA under an Emergency Use Authorization (EUA). This EUA will remain  in effect (meaning this test can be used) for the duration of the COVID-19 declaration under Section 56 4(b)(1) of the Act, 21  U.S.C. section 360bbb-3(b)(1), unless the authorization is terminated or revoked sooner. Performed at [facility], [place] [address] [place], [place] [address]     Coagulation Studies: Recent Labs    10/15/19 0636 10/16/19 0533 10/17/19 0343  LABPROT 51.0* 21.1* 20.1*  INR 5.6* 1.8* 1.7*    Urinalysis: No results for input(s): COLORURINE, LABSPEC, PHURINE, GLUCOSEU, HGBUR, BILIRUBINUR, KETONESUR, PROTEINUR, UROBILINOGEN, NITRITE, LEUKOCYTESUR in the last 72 hours.  Invalid input(s): APPERANCEUR    Imaging: No results found.   Medications:   . sodium chloride     . aspirin EC  81 mg Oral Daily  . atorvastatin  10 mg Oral Daily  . carvedilol  6.25 mg Oral BID WC  . diltiazem  240 mg Oral Daily  . feeding supplement (NEPRO CARB STEADY)  237 mL Oral BID BM  . insulin aspart  0-15 Units Subcutaneous TID WC  . insulin aspart  0-5 Units Subcutaneous QHS  . pantoprazole  40 mg Oral Daily  . sodium chloride flush  3 mL Intravenous Q12H  . sodium chloride flush  3 mL Intravenous Q12H  . warfarin  5 mg Oral ONCE-1600  . Warfarin - Pharmacist Dosing Inpatient   Does not apply q1600   sodium chloride, acetaminophen, ipratropium-albuterol, ondansetron **OR** ondansetron (ZOFRAN) IV, senna-docusate, sodium chloride flush, traZODone  Assessment/ Plan:  [name] is a 86 y.o. black female with diastolic congestive heart failure, COPD on 2L O2, diabetes mellitus type II insulin dependent, hypertension, atrial fibrillation, obstructive sleep apnea, pacemaker, aortic valve replacement on warfarin, who was admitted to [facility] on admitted on 10/09/2019 for Bilateral pleural effusion [J90] Acute on chronic diastolic congestive heart failure (HCC) [I50.33] Chronic respiratory failure with hypoxia (HCC) [J96.11] Acute on chronic diastolic (congestive) heart failure ([place]) [I50.33]  #. Acute renal failure with hyperkalemia on chronic kidney disease stage IIIB with proteinuria:  baseline creatinine of 1.1, GFR of 45 in 07/16/2019.  Chronic Kidney Disease seems to be secondary to diabetes and hypertension.  Acute renal failure most likely secondary to cardiorenal syndrome.  No IV contrast exposure. No obstruction on renal ultrasound Worsening renal function with critically elevated BUN and creatinine  Dialysis would be difficult for patient due to advanced age and multiple co morbidities.   It is not clear whether patient will need dialysis for short-term or long-term.  Her recent serum creatinine was 1.70 on October 11, 2019.  GFR 36. Based on creatinine  of 1.7 recently,there is higher chance that dialysis requirement will be temporary but that cannot be definitively determined. - trial of Furosemide  - [place]el[place] - palliative care is appropriate.  Hospice is being considered  #. Diabetes mellitus type II with chronic kidney disease:  Lab Results  Component Value Date   HGBA1C 6.2 (H) 09/15/2019  insulin dependent.    #. Anemia with chronic kidney disease:  Lab Results  Component Value Date   HGB 10.0 (L) 10/16/2019   # LE Edema and fluid overload IV Lasix as needed.     LOS: 7 Kyle Luppino 4/12/20212:50 PM

## 2019-10-18 ENCOUNTER — Ambulatory Visit: Payer: Medicare Other

## 2019-10-18 DIAGNOSIS — I5032 Chronic diastolic (congestive) heart failure: Secondary | ICD-10-CM | POA: Diagnosis not present

## 2019-10-18 DIAGNOSIS — N179 Acute kidney failure, unspecified: Secondary | ICD-10-CM | POA: Diagnosis not present

## 2019-10-18 DIAGNOSIS — I130 Hypertensive heart and chronic kidney disease with heart failure and stage 1 through stage 4 chronic kidney disease, or unspecified chronic kidney disease: Secondary | ICD-10-CM | POA: Diagnosis not present

## 2019-10-18 DIAGNOSIS — I4891 Unspecified atrial fibrillation: Secondary | ICD-10-CM | POA: Diagnosis not present

## 2019-10-18 DIAGNOSIS — I739 Peripheral vascular disease, unspecified: Secondary | ICD-10-CM | POA: Diagnosis not present

## 2019-10-18 DIAGNOSIS — Z9989 Dependence on other enabling machines and devices: Secondary | ICD-10-CM | POA: Diagnosis not present

## 2019-10-18 DIAGNOSIS — N183 Chronic kidney disease, stage 3 unspecified: Secondary | ICD-10-CM | POA: Diagnosis not present

## 2019-10-18 DIAGNOSIS — J449 Chronic obstructive pulmonary disease, unspecified: Secondary | ICD-10-CM | POA: Diagnosis not present

## 2019-10-18 DIAGNOSIS — Z953 Presence of xenogenic heart valve: Secondary | ICD-10-CM | POA: Diagnosis not present

## 2019-10-18 DIAGNOSIS — G4733 Obstructive sleep apnea (adult) (pediatric): Secondary | ICD-10-CM | POA: Diagnosis not present

## 2019-10-18 DIAGNOSIS — E785 Hyperlipidemia, unspecified: Secondary | ICD-10-CM | POA: Diagnosis not present

## 2019-10-18 DIAGNOSIS — K219 Gastro-esophageal reflux disease without esophagitis: Secondary | ICD-10-CM | POA: Diagnosis not present

## 2019-10-18 DIAGNOSIS — Z9981 Dependence on supplemental oxygen: Secondary | ICD-10-CM | POA: Diagnosis not present

## 2019-10-18 DIAGNOSIS — Z950 Presence of cardiac pacemaker: Secondary | ICD-10-CM | POA: Diagnosis not present

## 2019-10-18 DIAGNOSIS — J9611 Chronic respiratory failure with hypoxia: Secondary | ICD-10-CM | POA: Diagnosis not present

## 2019-10-18 DIAGNOSIS — R34 Anuria and oliguria: Secondary | ICD-10-CM | POA: Diagnosis not present

## 2019-10-19 ENCOUNTER — Ambulatory Visit: Payer: Medicare Other | Admitting: Family

## 2019-10-19 ENCOUNTER — Other Ambulatory Visit: Payer: Self-pay | Admitting: *Deleted

## 2019-10-19 NOTE — Patient Outreach (Signed)
[facility]) Care Management  10/19/2019  CLEMMIE DEJONG [birth year] [id number]   Noted that member discharged from hospital yesterday home with hospice.  Call placed to member's daughter to confirm services has started, no answer.  HIPAA compliant voice message left.  Noted that member has since passed away, will close case at this time.  Valente David, [place], MSN [place] [phone]

## 2019-10-20 ENCOUNTER — Ambulatory Visit: Payer: Medicare Other | Admitting: Family

## 2019-10-20 ENCOUNTER — Telehealth: Payer: Self-pay

## 2019-10-20 NOTE — Telephone Encounter (Signed)
Signed and gave [employer] funeral death certificate. Beth

## 2019-10-21 ENCOUNTER — Other Ambulatory Visit: Payer: Medicare Other

## 2019-10-25 ENCOUNTER — Telehealth: Payer: Self-pay

## 2019-10-25 NOTE — Telephone Encounter (Signed)
Plan of care signed by provider and faxed back to [facility] collective.

## 2019-10-27 ENCOUNTER — Ambulatory Visit: Payer: Medicare Other | Admitting: *Deleted

## 2019-10-28 ENCOUNTER — Ambulatory Visit: Payer: Medicare Other | Admitting: Nurse Practitioner

## 2019-10-30 DIAGNOSIS — J440 Chronic obstructive pulmonary disease with acute lower respiratory infection: Secondary | ICD-10-CM | POA: Diagnosis not present

## 2019-11-05 DEATH — deceased

## 2019-12-01 ENCOUNTER — Telehealth: Payer: Self-pay

## 2019-12-01 NOTE — Telephone Encounter (Signed)
Plan of care signed and mailed back to [place] at [facility].

## 2020-01-29 IMAGING — DX PORTABLE CHEST - 1 VIEW
1 series · 1 of 1 positions shown · non-contrast
Comparison: 08/02/2018.

CLINICAL DATA: Shortness of breath.

EXAM:
PORTABLE CHEST 1 VIEW

[chest ap]
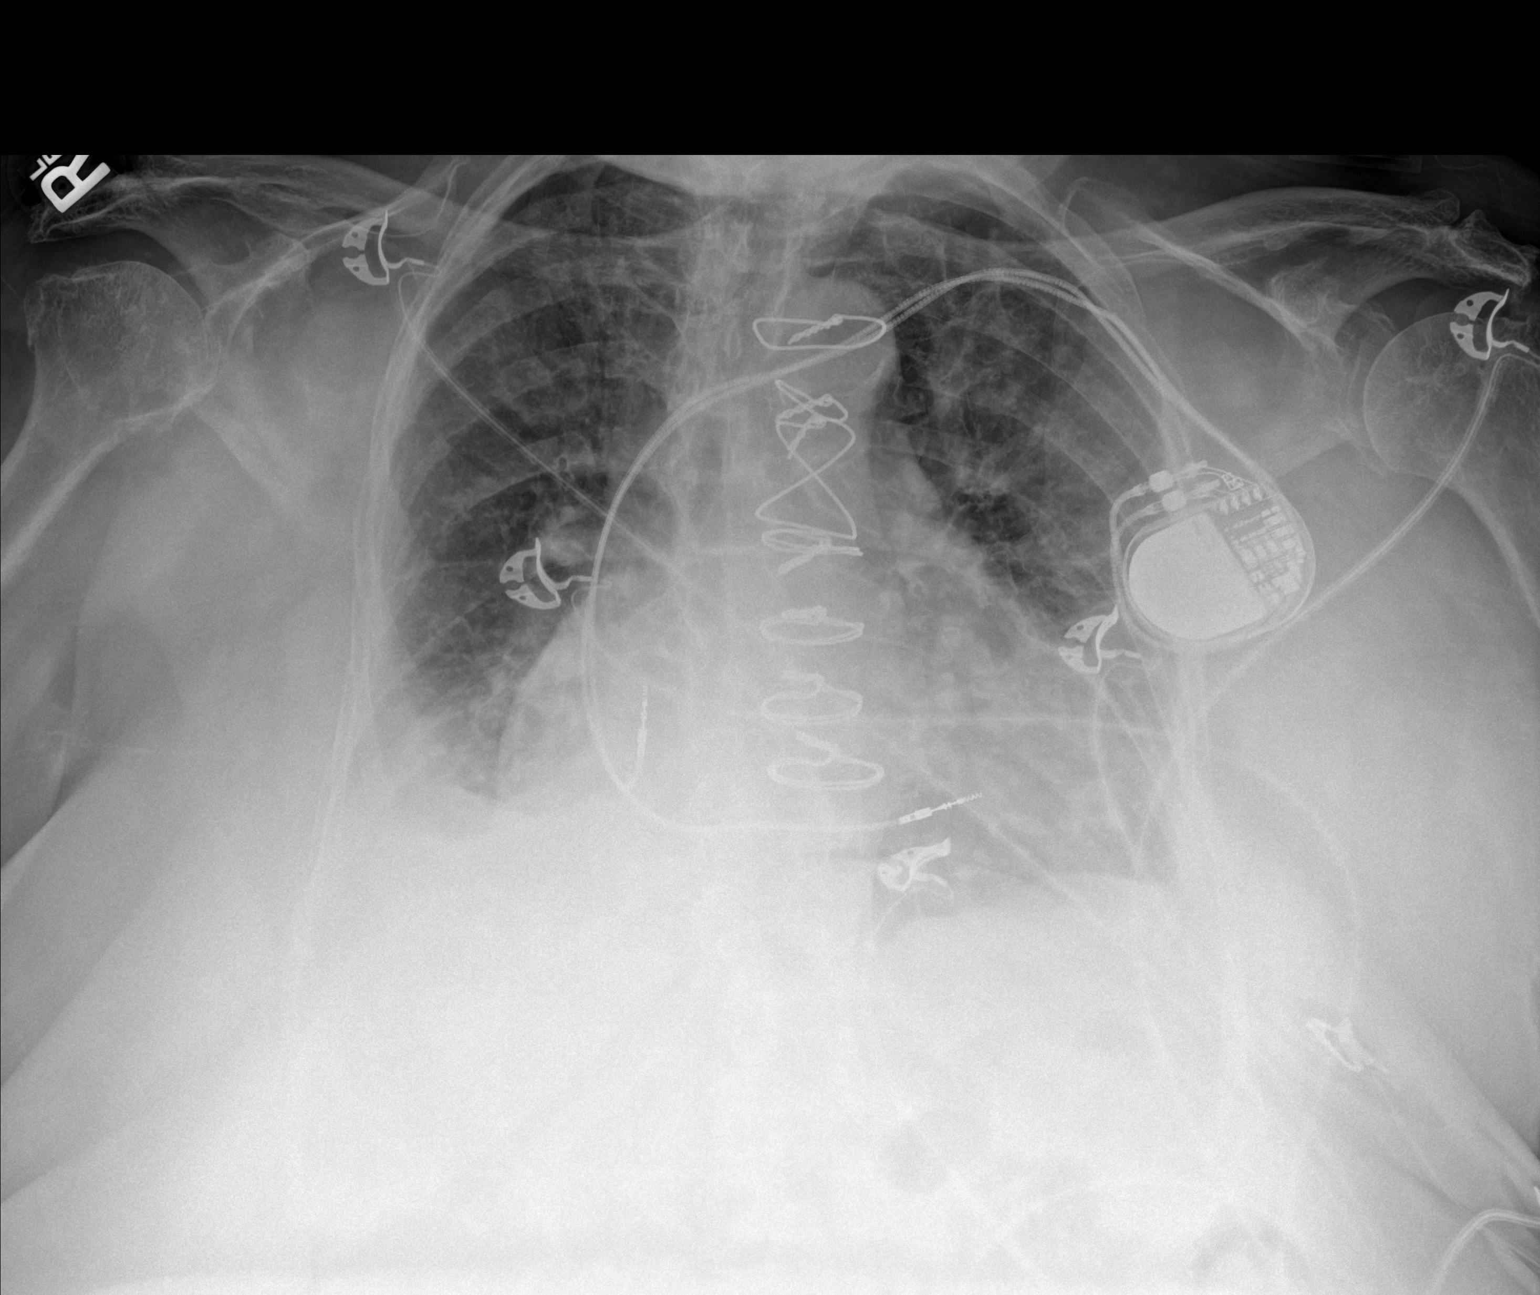

[1 of 1 positions shown; findings below may reference images not displayed]

FINDINGS: Cardiac pacer stable position. Prior median sternotomy. Cardiomegaly
with pulmonary venous congestion and bilateral interstitial
prominence. Bilateral pleural effusion noted. Similar findings noted
on prior exam is suggest CHF. Pneumonitis cannot be excluded.
IMPRESSION: Cardiac pacer stable position. Prior median sternotomy. Cardiomegaly
with pulmonary vascular congestion, bilateral interstitial
prominence, and small bilateral pleural effusions suggesting CHF.
Similar findings noted on prior exam.

## 2020-02-21 IMAGING — DX PORTABLE CHEST - 1 VIEW
1 series · 1 of 1 positions shown · non-contrast
Comparison: 03/01/2019

CLINICAL DATA: Pulmonary disease, hypertension

EXAM:
PORTABLE CHEST 1 VIEW

[chest ap]
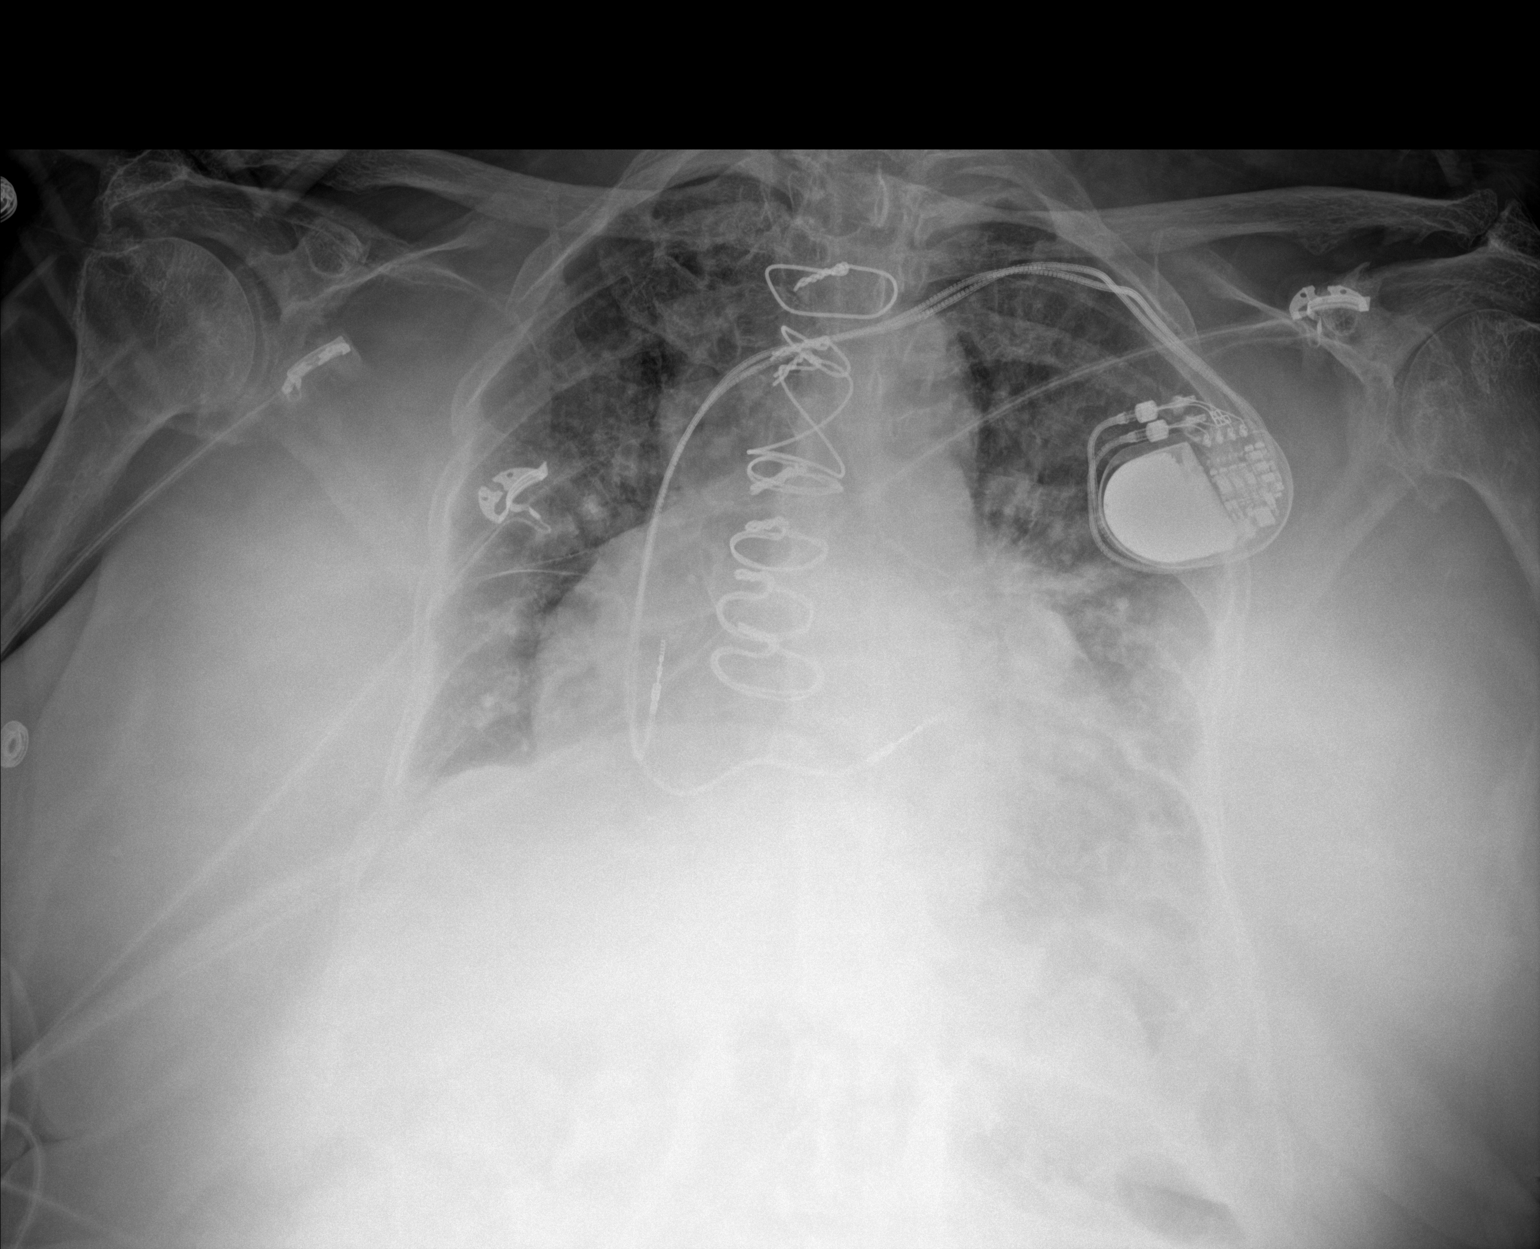

[1 of 1 positions shown; findings below may reference images not displayed]

FINDINGS: Bilateral interstitial and patchy alveolar airspace opacities. No
pleural effusion or pneumothorax. Stable cardiomegaly. Prior CABG.
Dual lead cardiac pacemaker.
IMPRESSION: Mild CHF.

## 2020-02-22 IMAGING — DX ABDOMEN - 1 VIEW
2 series · 2 of 2 positions shown · non-contrast
Comparison: Plain films of the abdomen 09/19/2015. CT abdomen and
pelvis 11/21/2013.

CLINICAL DATA: Chronic diffuse abdominal pain.

EXAM:
ABDOMEN - 1 VIEW

[abdomen supine (1 of 2)]
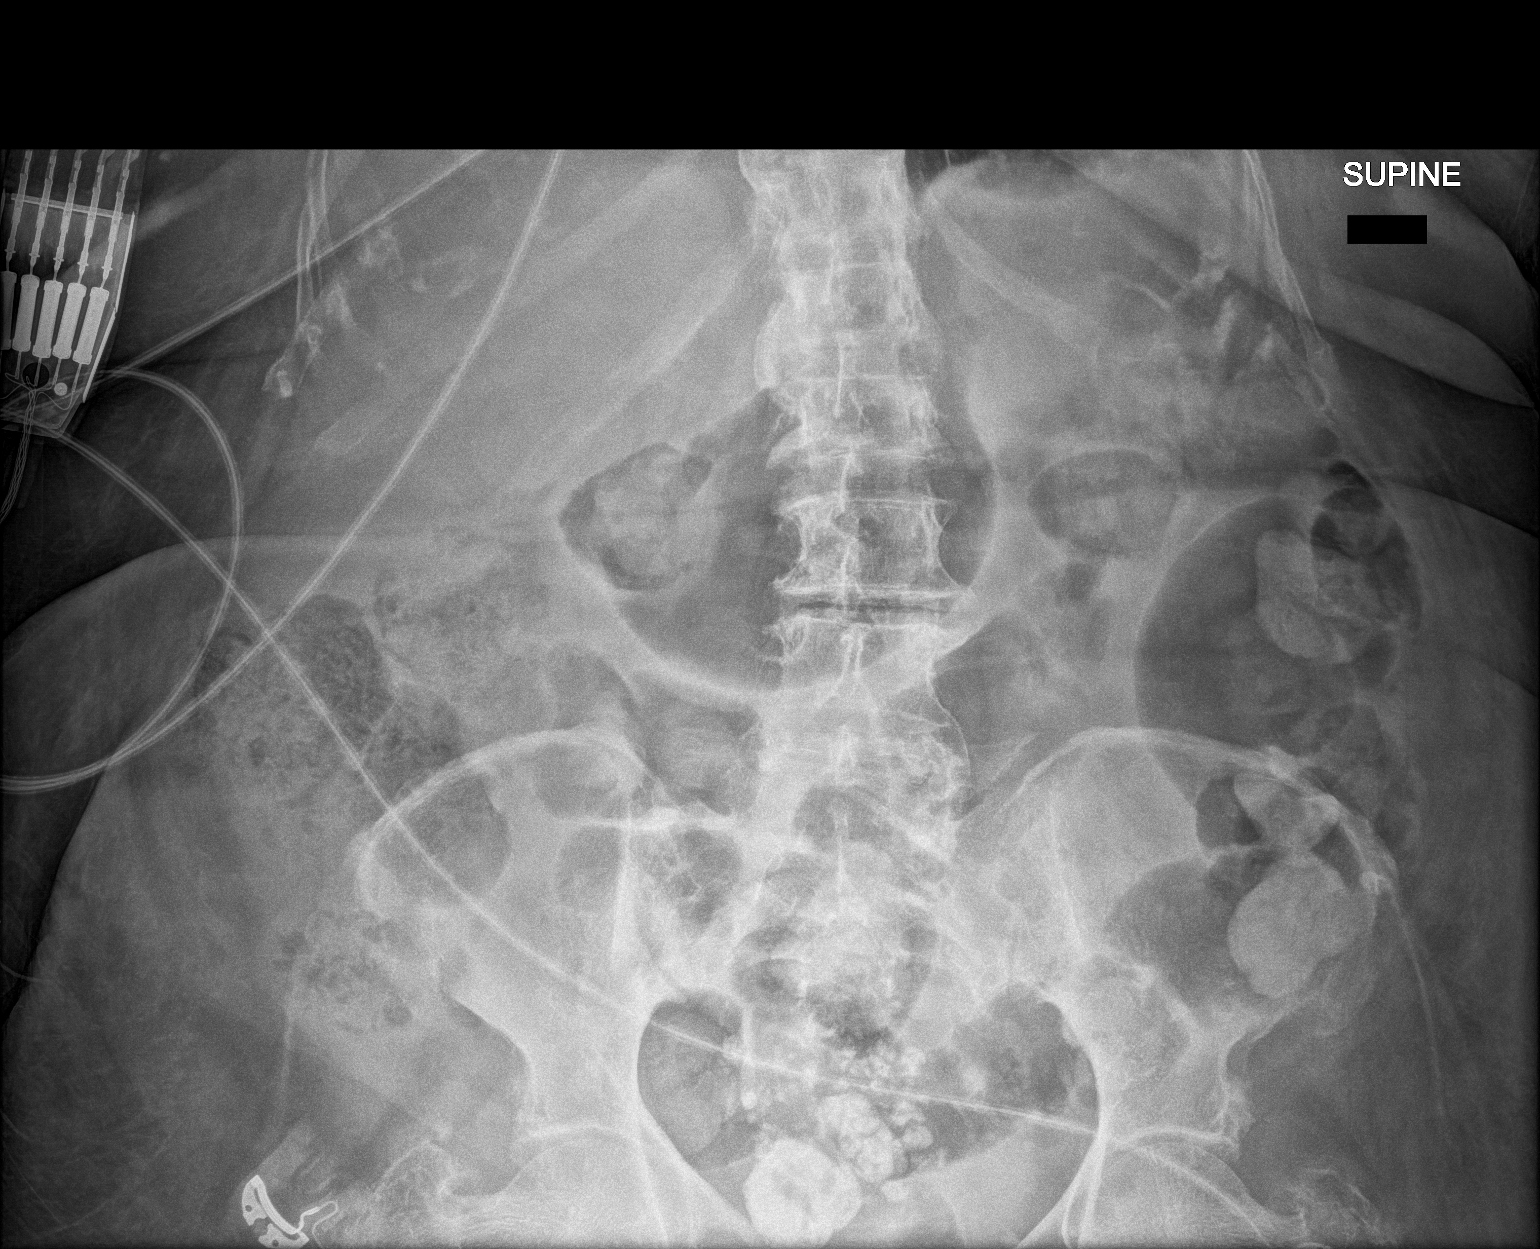

[abdomen supine (2 of 2)]
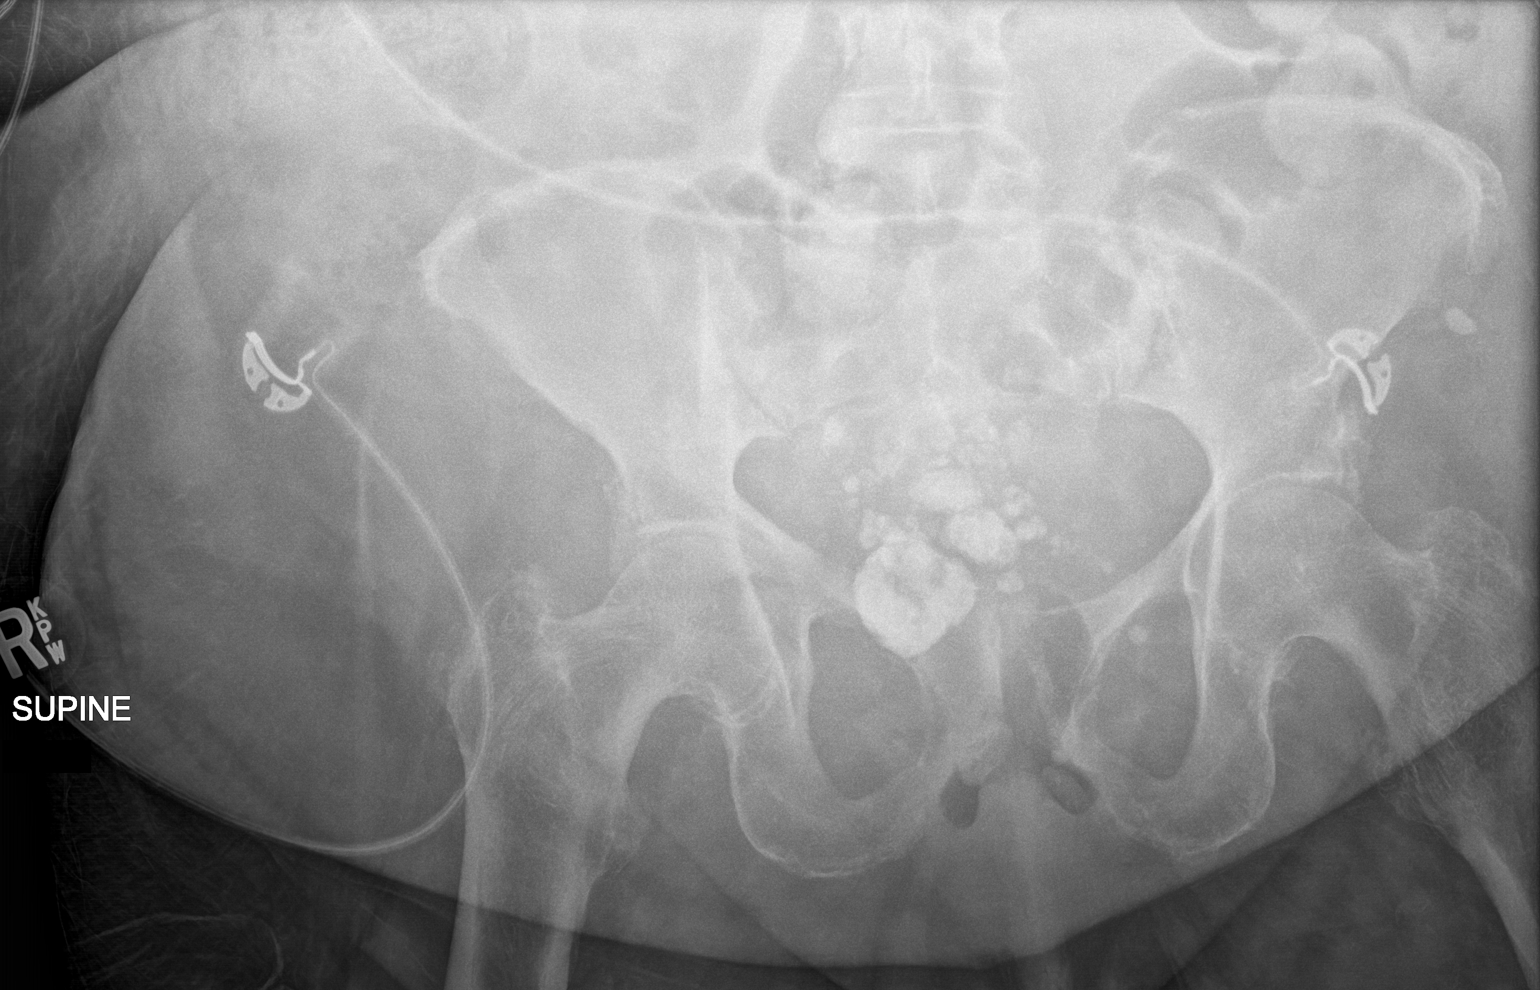

[2 of 2 positions shown; findings below may reference images not displayed]

FINDINGS: The bowel gas pattern is nonobstructive. There is a large volume of
stool throughout the colon. Calcified uterine fibroids are seen as
on prior exams.
IMPRESSION: No acute finding.

Large colonic stool burden.

Calcified uterine fibroids.

## 2020-07-26 ENCOUNTER — Ambulatory Visit: Payer: Medicare Other | Admitting: Nurse Practitioner
